# Patient Record
Sex: Female | Born: 1965 | Race: Black or African American | Hispanic: No | Marital: Single | Smoking: Never smoker
Health system: Southern US, Community
[De-identification: ages and names within clinical notes are randomized; demographics above are authoritative.]

## PROBLEM LIST (undated history)

## (undated) VITALS — BP 111/74 | HR 121 | Temp 98.0°F | Resp 18 | Ht 62.0 in | Wt 140.0 lb

## (undated) DIAGNOSIS — Z789 Other specified health status: Secondary | ICD-10-CM

## (undated) DIAGNOSIS — F319 Bipolar disorder, unspecified: Secondary | ICD-10-CM

## (undated) DIAGNOSIS — F99 Mental disorder, not otherwise specified: Secondary | ICD-10-CM

## (undated) HISTORY — PX: NO PAST SURGERIES: SHX2092

---

## 2004-10-12 ENCOUNTER — Ambulatory Visit: Payer: Self-pay | Admitting: Internal Medicine

## 2004-10-23 ENCOUNTER — Encounter: Admission: RE | Admit: 2004-10-23 | Discharge: 2004-11-02 | Payer: Self-pay | Admitting: Internal Medicine

## 2010-04-04 ENCOUNTER — Ambulatory Visit: Payer: Self-pay | Admitting: Diagnostic Radiology

## 2010-04-04 ENCOUNTER — Ambulatory Visit (HOSPITAL_BASED_OUTPATIENT_CLINIC_OR_DEPARTMENT_OTHER): Admission: RE | Admit: 2010-04-04 | Discharge: 2010-04-04 | Payer: Self-pay | Admitting: Emergency Medicine

## 2011-11-01 ENCOUNTER — Ambulatory Visit (INDEPENDENT_AMBULATORY_CARE_PROVIDER_SITE_OTHER): Payer: 59 | Admitting: Internal Medicine

## 2011-11-01 VITALS — BP 112/76 | HR 98 | Temp 98.3°F | Resp 18 | Ht 61.5 in | Wt 145.4 lb

## 2011-11-01 DIAGNOSIS — J042 Acute laryngotracheitis: Secondary | ICD-10-CM

## 2011-11-01 MED ORDER — BENZONATATE 200 MG PO CAPS: 200.0000 mg | ORAL_CAPSULE | Freq: Three times a day (TID) | ORAL | Status: AC | PRN

## 2011-11-01 MED ORDER — AZITHROMYCIN 250 MG PO TABS: ORAL_TABLET | ORAL | Status: AC

## 2011-11-01 NOTE — Progress Notes (Signed)
  Subjective:    Patient ID: Yvonne [name], female    DOB: [birth year], 46 y.o.   MRN: [id number]  Sore Throat  This is a new problem. The current episode started in the past 7 days. The problem has been gradually improving. There has been no fever. Associated symptoms include congestion, coughing and a hoarse voice. Pertinent negatives include no trouble swallowing or vomiting.  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  She has less sore throat now but has not had much of a voice for 2 days.  She has not had a fever, vomiting.  She denies pregnancy.  She has tried Robitussin without relief.  She does not smoke.    Review of Systems  HENT: Positive for congestion and hoarse voice. Negative for trouble swallowing.   Respiratory: Positive for cough.   Gastrointestinal: Negative for vomiting.  All other systems reviewed and are negative.       Objective:   Physical Exam  Vitals reviewed. Constitutional: She is oriented to person, place, and time. She appears well-developed and well-nourished. No distress.  HENT:  Head: Normocephalic.  Right Ear: External ear normal.  Left Ear: External ear normal.  Mouth/Throat: No oropharyngeal exudate.       Voice very weak  Eyes: Conjunctivae are normal.  Cardiovascular: Normal rate, regular rhythm and normal heart sounds.   Pulmonary/Chest: Effort normal and breath sounds normal. No respiratory distress. She has no wheezes. She has no rales. She exhibits no tenderness.  Abdominal: Soft.  Lymphadenopathy:    She has no cervical adenopathy.  Neurological: She is alert and oriented to person, place, and time.  Skin: Skin is warm and dry.  Psychiatric: She has a normal mood and affect. Her behavior is normal.          Assessment & Plan:  Laryngotracheitis:  Zpack & Tessalon Perles.  OOW for 2 days, return Wednesday.  RTC if symptoms do not improve

## 2011-11-01 NOTE — Patient Instructions (Signed)
Take your medication as prescribed and get extra rest and fluids.  Laryngitis At the top of your windpipe is your voice box. It is the source of your voice. Inside your voice box are 2 bands of muscles called vocal cords. When you breathe, your vocal cords are relaxed and open so that air can get into the lungs. When you decide to say something, these cords come together and vibrate. The sound from these vibrations goes into your throat and comes out through your mouth as sound. Laryngitis is an inflammation of the vocal cords that causes hoarseness, cough, loss of voice, sore throat, and dry throat. Laryngitis can be temporary (acute) or long-term (chronic). Most cases of acute laryngitis improve with time.Chronic laryngitis lasts for more than 3 weeks. CAUSES Laryngitis can often be related to excessive smoking, talking, or yelling, as well as inhalation of toxic fumes and allergies. Acute laryngitis is usually caused by a viral infection, vocal strain, measles or mumps, or bacterial infections. Chronic laryngitis is usually caused by vocal cord strain, vocal cord injury, postnasal drip, growths on the vocal cords, or acid reflux. SYMPTOMS   Cough.   Sore throat.   Dry throat.  RISK FACTORS  Respiratory infections.   Exposure to irritating substances, such as cigarette smoke, excessive amounts of alcohol, stomach acids, and workplace chemicals.   Voice trauma, such as vocal cord injury from shouting or speaking too loud.  DIAGNOSIS  Your cargiver will perform a physical exam. During the physical exam, your caregiver will examine your throat. The most common sign of laryngitis is hoarseness. Laryngoscopy may be necessary to confirm the diagnosis of this condition. This procedure allows your caregiver to look into the larynx. HOME CARE INSTRUCTIONS  Drink enough fluids to keep your urine clear or pale yellow.   Rest until you no longer have symptoms or as directed by your caregiver.    Breathe in moist air.   Take all medicine as directed by your caregiver.   Do not smoke.   Talk as little as possible (this includes whispering).   Write on paper instead of talking until your voice is back to normal.   Follow up with your caregiver if your condition has not improved after 10 days.  SEEK MEDICAL CARE IF:   You have trouble breathing.   You cough up blood.   You have persistent fever.   You have increasing pain.   You have difficulty swallowing.  MAKE SURE YOU:  Understand these instructions.   Will watch your condition.   Will get help right away if you are not doing well or get worse.  Document Released: 06/28/2005 Document Revised: 06/17/2011 Document Reviewed: 09/03/2010 [facility] Patient Information 2012 [place], [place].

## 2012-06-20 ENCOUNTER — Encounter (HOSPITAL_COMMUNITY): Payer: Self-pay | Admitting: Licensed Clinical Social Worker

## 2012-06-20 ENCOUNTER — Inpatient Hospital Stay (HOSPITAL_COMMUNITY)
Admission: RE | Admit: 2012-06-20 | Discharge: 2012-07-03 | DRG: 881 | Disposition: A | Discharge status: Home / Self Care | Payer: 59 | Attending: Psychiatry | Admitting: Psychiatry

## 2012-06-20 DIAGNOSIS — F329 Major depressive disorder, single episode, unspecified: Secondary | ICD-10-CM

## 2012-06-20 DIAGNOSIS — F3289 Other specified depressive episodes: Principal | ICD-10-CM | POA: Diagnosis present

## 2012-06-20 DIAGNOSIS — F411 Generalized anxiety disorder: Secondary | ICD-10-CM | POA: Diagnosis present

## 2012-06-20 HISTORY — DX: Other specified health status: Z78.9

## 2012-06-20 MED ORDER — TRAZODONE HCL 50 MG PO TABS
50.0000 mg | ORAL_TABLET | Freq: Every evening | ORAL | Status: DC | PRN
  Filled 2012-06-20 (×31): qty 1

## 2012-06-20 MED ORDER — MAGNESIUM HYDROXIDE 400 MG/5ML PO SUSP: 30.0000 mL | Freq: Every day | ORAL | Status: DC | PRN

## 2012-06-20 MED ORDER — ACETAMINOPHEN 325 MG PO TABS: 650.0000 mg | ORAL_TABLET | Freq: Four times a day (QID) | ORAL | Status: DC | PRN

## 2012-06-20 MED ORDER — ALUM & MAG HYDROXIDE-SIMETH 200-200-20 MG/5ML PO SUSP: 30.0000 mL | ORAL | Status: DC | PRN

## 2012-06-20 MED ORDER — NICOTINE 21 MG/24HR TD PT24
21.0000 mg | MEDICATED_PATCH | Freq: Every day | TRANSDERMAL | Status: DC
  Filled 2012-06-20 (×3): qty 1

## 2012-06-20 NOTE — BH Assessment (Signed)
Assessment Note   Yvonne [name] is an 46 y.o. female, single, African-American who presented to [facility] for assessment accompanied by her sister, Yvonne [name], who participated in the assessment at the Pt's request. Pt was referred for assessment for inpatient treatment by a therapist at the [facility], where the Pt went for her first outpatient appointment, due to the Pt being too acute for outpatient treatment, per Pt's sister. Pt reports "I can't function." She reports she cannot sleep and has been sleeping 2 hour per night for the past week. She reports severe depressive symptoms since Thanksgiving including uncontrollable crying, insomnia, poor appetite with unknown weight loss, social withdrawal, poor concentration, poor memory, decreased grooming, staying in bed and feelings of helplessness and hopelessness. She has been missing work an unable to function on her job at [employer]. She was unable to go to work today. She denies suicidal ideation or history of suicide attempts. She denies homicidal ideation or a history of violence. Pt denies auditory or visual hallucinations. She denies alcohol or substance abuse. She denies legal problems. Pt denies medical problems and is not currently on any medications.  During assessment Pt appeared confused at times and not fully oriented. She had difficulty focusing and processing what was being told to her. I explained 3 times that she was at a hospital before she appeared to fully understand. She was tearful throughout assessment, sobbing at times. She appeared disheveled with poor eye contact and frequently covered her face with her hands.   Pt reports financial problems as her primary stressor. She says she has bills she cannot pay and is worried about maintaining her residence. She lives in a townhouse with her 81 year old son, whom she describes as "okay". During the assessment she perseverated on her inability to function or work.  Pt's sister  reports the Pt and family members just returned from a family cruise and on the cruise the Pt was withdrawn, not sleeping or eating very much. Pt has no history of inpatient or outpatient mental health treatment and has never taken psychiatric medication. He sister reports she has never seen the Pt this way. She reports Pt's mood and behavior are very atypical "This is not [place]." Some of Pt's siblings have been treated for depression and one of her brothers has been psychiatrically hospitalized.   Consulted with Donell Sievert, PA who agreed that due to Pt's severe depressive symptoms, her decreased functioning in terms of caring for herself and inability to work and the fact that Pt has no outpatient providers and cannot be seen by an outpatient psychiatrist in a timely manner that Pt could benefit from inpatient psychiatric treatment.  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 No pertinent past medical history    Axis IV: economic problems Axis V: GAF = 40  Past Medical History:  Past Medical History  Diagnosis Date  . No pertinent past medical history     Past Surgical History  Procedure Date  . No past surgeries     Family History: No family history on file.  Social History:  reports that she has never smoked. She does not have any smokeless tobacco history on file. She reports that she does not drink alcohol or use illicit drugs.  Additional Social History:  Alcohol / Drug Use Pain Medications: Denies Prescriptions: Denies Over the Counter: Denies History of alcohol / drug use?: No history  of alcohol / drug abuse Longest period of sobriety (when/how long): None  CIWA:   COWS:    Allergies: No Known Allergies  Home Medications:  No prescriptions prior to admission    OB/GYN Status:  No LMP recorded. Patient is postmenopausal.  General Assessment Data Location of Assessment: [facility] Assessment  Services Living Arrangements: Children (Lives with 48 year old son) Can pt return to current living arrangement?: Yes Admission Status: Voluntary Is patient capable of signing voluntary admission?: Yes Transfer from: Home Referral Source: Other ([facility])  Education Status Is patient currently in school?: No Current Grade: NA Highest grade of school patient has completed: NA Name of school: NA Contact person: NA  Risk to self Suicidal Ideation: No Suicidal Intent: No Is patient at risk for suicide?: No Suicidal Plan?: No Access to Means: No What has been your use of drugs/alcohol within the last 12 months?: Pt denies Previous Attempts/Gestures: No How many times?: 0  Other Self Harm Risks: None Triggers for Past Attempts: None known Intentional Self Injurious Behavior: None Family Suicide History: No;See progress notes Recent stressful life event(s): Financial Problems Persecutory voices/beliefs?: No Depression: Yes Depression Symptoms: Despondent;Insomnia;Tearfulness;Isolating;Fatigue;Guilt;Loss of interest in usual pleasures;Feeling worthless/self pity;Feeling angry/irritable Substance abuse history and/or treatment for substance abuse?: No Suicide prevention information given to non-admitted patients: Not applicable  Risk to Others Homicidal Ideation: No Thoughts of Harm to Others: No Current Homicidal Intent: No Current Homicidal Plan: No Access to Homicidal Means: No Identified Victim: None History of harm to others?: No Assessment of Violence: None Noted Violent Behavior Description: Pt denies history of violence Does patient have access to weapons?: No Criminal Charges Pending?: No Does patient have a court date: No  Psychosis Hallucinations: None noted Delusions: None noted  Mental Status Report Appear/Hygiene: Disheveled Eye Contact: Fair Motor Activity: Other (Comment) (Slumped over) Speech: Soft;Slow Level of Consciousness: Crying Mood:  Depressed;Helpless;Sad;Despair Affect: Sad;Depressed Anxiety Level: None Thought Processes: Coherent Judgement: Unimpaired Orientation: Person;Situation Obsessive Compulsive Thoughts/Behaviors: None  Cognitive Functioning Concentration: Decreased Memory: Recent Impaired;Remote Intact IQ: Average Insight: Poor Impulse Control: Poor Appetite: Poor Weight Loss: 0  (Unknown) Weight Gain: 0  Sleep: Decreased Total Hours of Sleep: 2  Vegetative Symptoms: Decreased grooming;Staying in bed  ADLScreening [facility] Assessment Services) Patient's cognitive ability adequate to safely complete daily activities?: Yes Patient able to express need for assistance with ADLs?: Yes Independently performs ADLs?: Yes (appropriate for developmental age)  Abuse/Neglect [facility]) Physical Abuse: Denies Verbal Abuse: Denies Sexual Abuse: Denies  Prior Inpatient Therapy Prior Inpatient Therapy: No Prior Therapy Dates: NA Prior Therapy Facilty/Provider(s): NA Reason for Treatment: NA  Prior Outpatient Therapy Prior Outpatient Therapy: No Prior Therapy Dates: NA Prior Therapy Facilty/Provider(s): NA Reason for Treatment: NA  ADL Screening (condition at time of admission) Patient's cognitive ability adequate to safely complete daily activities?: Yes Patient able to express need for assistance with ADLs?: Yes Independently performs ADLs?: Yes (appropriate for developmental age) Weakness of Legs: None Weakness of Arms/Hands: None  Home Assistive Devices/Equipment Home Assistive Devices/Equipment: None    Abuse/Neglect Assessment (Assessment to be complete while patient is alone) Physical Abuse: Denies Verbal Abuse: Denies Sexual Abuse: Denies Exploitation of patient/patient's resources: Denies Self-Neglect: Denies     Merchant navy officer (For Healthcare) Advance Directive: Patient does not have advance directive;Patient would not like information Pre-existing out of facility DNR order (yellow  form or pink MOST form): No Nutrition Screen- [facility] Adult/WL/AP Patient's home diet: Regular Have you recently lost weight without trying?: No Have you  been eating poorly because of a decreased appetite?: No Malnutrition Screening Tool Score: 0   Additional Information 1:1 In Past 12 Months?: No CIRT Risk: No Elopement Risk: No Does patient have medical clearance?: No     Disposition:  Disposition Disposition of Patient: Inpatient treatment program Type of inpatient treatment program: Adult  On Site Evaluation by:   Reviewed with Physician:  Donell Sievert, PA   Patsy Baltimore, Harlin Rain 06/20/2012 8:47 PM

## 2012-06-20 NOTE — H&P (Signed)
Psychiatric Admission Assessment Adult  Patient Identification:  Yvonne [name] Date of Evaluation:  06/20/2012 Chief Complaint:  311 Depressive Disorder NOS History of Present Illness::Pt is a AAF who has presented to [facility] as a walk in with acute onset depressive concerns x one week duration. Pt states depression triggers c/o financial concerns, and relationship issues. Pt denies any prior h/o of psychiatric problems and or prior psychiatric DX. Pt does have a significant family hx of Mental Illness i.e. Son with Bipolar D/o, and sisters x two dx with major depression. Pt denies use of tobacco, alcohol and or polysubstance abuse. 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 Pt is also having crying spells with feelings of guilt due to some financial and relationship decisions that she has made. The patient rates her depressive sx a 7/10 and denies SI/SA/HI and or AVH. Pt denies any concerns with PTSD and or prior h/o of sexual, physical or emotional abuse. Pt is right handed and endorses some brief prior military experience. Elements:  Timing:  one week. Associated Signs/Synptoms: Depression Symptoms:  depressed mood, (Hypo) Manic Symptoms:  none Anxiety Symptoms:  Excessive Worry, Psychotic Symptoms:  none PTSD Symptoms: NA  VS: T 97.6, P91, R16 BP 118/82  Psychiatric Specialty Exam: Physical Exam  Constitutional: She is oriented to person, place, and time. She appears well-developed and well-nourished.  HENT:  Head: Normocephalic.  Eyes: Pupils are equal, round, and reactive to light.  Neck: Neck supple. No thyromegaly present.  Cardiovascular: Normal rate and regular rhythm.  Exam reveals no friction rub.   No murmur heard. Respiratory: Effort normal.  GI: Soft.  Musculoskeletal: Normal range of motion.  Lymphadenopathy:    She has no cervical adenopathy.  Neurological: She is alert and oriented  to person, place, and time. Abnormal coordination: flat, sad affect.  Skin: Skin is warm and dry.    Review of Systems  Constitutional: Negative.   Eyes: Negative.   Respiratory: Negative.   Cardiovascular: Negative.   Gastrointestinal: Negative.   Genitourinary: Negative.   Musculoskeletal: Negative.   Skin: Negative.   Neurological: Positive for headaches.  Endo/Heme/Allergies: Negative.   Psychiatric/Behavioral: Positive for depression. The patient is nervous/anxious and has insomnia.     There were no vitals taken for this visit.There is no height or weight on file to calculate BMI.  General Appearance: Disheveled  Eye Contact::  Poor  Speech:  Garbled  Volume:  Decreased  Mood:  Depressed  Affect:  Congruent  Thought Process:  Disorganized  Orientation:  Full (Time, Place, and Person)  Thought Content:  confused,  Suicidal Thoughts:  No  Homicidal Thoughts:  No  Memory:  Immediate;   Fair  Judgement:  Fair  Insight:  Shallow  Psychomotor Activity:  Negative and Normal  Concentration:  Poor  Recall:  Fair  Akathisia:  NA  Handed:  Right  AIMS (if indicated):     Assets:  Desire for Improvement  Sleep:       Past Psychiatric History: Diagnosis:  Hospitalizations:  Outpatient Care:  Substance Abuse Care:  Self-Mutilation:  Suicidal Attempts:  Violent Behaviors:   Past Medical History:   Past Medical History  Diagnosis Date  . No pertinent past medical history    None. Allergies:  No Known Allergies PTA Medications: No prescriptions prior to admission    Previous Psychotropic Medications:  Medication/Dose  Substance Abuse History in the last 12 months:  no  Consequences of Substance Abuse: NA  Social History:  reports that she has never smoked. She does not have any smokeless tobacco history on file. She reports that she does not drink alcohol or use illicit drugs. Additional Social History: Pain Medications:  Denies Prescriptions: Denies Over the Counter: Denies History of alcohol / drug use?: No history of alcohol / drug abuse Longest period of sobriety (when/how long): None                    Current Place of Residence:   Place of Birth:   Family Members: Marital Status:  Single Children:  Sons:  Daughters: Relationships: Education:  [employer] Problems/Performance: Religious Beliefs/Practices: History of Abuse (Emotional/Phsycial/Sexual) Occupational Experiences; Hotel manager History:  Data processing manager History: Hobbies/Interests:  Family History:  No family history on file.  No results found for this or any previous visit (from the past 72 hour(s)). Psychological Evaluations:  Assessment:   AXIS I:  Mood Disorder NOS AXIS II:  No diagnosis AXIS III:   Past Medical History  Diagnosis Date  . No pertinent past medical history    AXIS IV:  economic problems AXIS V:  31-40 impairment in reality testing  Treatment Plan/Recommendations:  Admit for crises mgmt, safety and stability Psychotropic medical mgmt to reduce current sx to baseline and reduce chance of relapse Mgmt of Chronic co morbidities as needed Social work to facilitate support services and aid in out patient stabilization and prevent frequent inpatient readmissions.   Treatment Plan Summary: Daily contact with patient to assess and evaluate symptoms and progress in treatment Medication management Current Medications:  No current facility-administered medications for this encounter.    Observation Level/Precautions:  15 minute checks  Laboratory:  CBC,BMP,TSH,UA,Hgba1c,LFT  Psychotherapy:    Medications:    Consultations:    Discharge Concerns:    Estimated LOS:  Other:     I certify that inpatient services furnished can reasonably be expected to improve the patient's condition.   Macel Yearsley E 12/10/20138:46 PM

## 2012-06-20 NOTE — Tx Team (Signed)
Initial Interdisciplinary Treatment Plan  PATIENT STRENGTHS: (choose at least two) Ability for insight Average or above average intelligence Capable of independent living Communication skills Motivation for treatment/growth Physical Health Religious Affiliation Supportive family/friends  PATIENT STRESSORS: Financial difficulties Legal issue   PROBLEM LIST: Problem List/Patient Goals Date to be addressed Date deferred Reason deferred Estimated date of resolution  " i just wanna be able to function" 06/20/12           Depression 06/20/12     Increased risk for suicide 06/20/12                                    DISCHARGE CRITERIA:  Ability to meet basic life and health needs Adequate post-discharge living arrangements Improved stabilization in mood, thinking, and/or behavior Motivation to continue treatment in a less acute level of care Need for constant or close observation no longer present Safe-care adequate arrangements made Verbal commitment to aftercare and medication compliance  PRELIMINARY DISCHARGE PLAN: Outpatient therapy Participate in family therapy Return to previous living arrangement Return to previous work or school arrangements  PATIENT/FAMIILY INVOLVEMENT: This treatment plan has been presented to and reviewed with the patient, Yvonne [name], and/or family member.  The patient and family have been given the opportunity to ask questions and make suggestions.  [name] 06/20/2012, 9:52 PM

## 2012-06-20 NOTE — Progress Notes (Signed)
Report received from K. Herbin RN. Writer entered patients room and observed patient lying in bed with covers pulled over her head asleep. Respirations even and no distress noted, safety maintained on unit with 15 min checks, will continue to monitor.

## 2012-06-21 DIAGNOSIS — F341 Dysthymic disorder: Secondary | ICD-10-CM

## 2012-06-21 LAB — URINE MICROSCOPIC-ADD ON

## 2012-06-21 LAB — URINALYSIS, ROUTINE W REFLEX MICROSCOPIC
Ketones, ur: NEGATIVE mg/dL
Nitrite: NEGATIVE
Protein, ur: NEGATIVE mg/dL
pH: 6.5 (ref 5.0–8.0)

## 2012-06-21 LAB — HEPATIC FUNCTION PANEL
Albumin: 3.8 g/dL (ref 3.5–5.2)
Bilirubin, Direct: <0.1 mg/dL (ref 0.0–0.3)
Total Bilirubin: 0.3 mg/dL (ref 0.3–1.2)

## 2012-06-21 LAB — COMPREHENSIVE METABOLIC PANEL
Albumin: 3.8 g/dL (ref 3.5–5.2)
Alkaline Phosphatase: 83 U/L (ref 39–117)
BUN: 11 mg/dL (ref 6–23)
CO2: 24 mEq/L (ref 19–32)
Chloride: 101 mEq/L (ref 96–112)
Creatinine, Ser: 0.68 mg/dL (ref 0.50–1.10)
GFR calc non Af Amer: >90 mL/min (ref >90)
Glucose, Bld: 92 mg/dL (ref 70–99)
Potassium: 3.6 mEq/L (ref 3.5–5.1)
Total Bilirubin: 0.3 mg/dL (ref 0.3–1.2)

## 2012-06-21 LAB — RAPID URINE DRUG SCREEN, HOSP PERFORMED: Barbiturates: NOT DETECTED

## 2012-06-21 LAB — CBC
MCH: 28.2 pg (ref 26.0–34.0)
MCHC: 34.5 g/dL (ref 30.0–36.0)
Platelets: 432 10*3/uL — ABNORMAL HIGH (ref 150–400)

## 2012-06-21 LAB — LIPID PANEL
Cholesterol: 166 mg/dL (ref 0–200)
LDL Cholesterol: 96 mg/dL (ref 0–99)
Total CHOL/HDL Ratio: 2.9 RATIO
VLDL: 12 mg/dL (ref 0–40)

## 2012-06-21 LAB — TSH: TSH: 0.517 u[IU]/mL (ref 0.350–4.500)

## 2012-06-21 LAB — HEMOGLOBIN A1C: Hgb A1c MFr Bld: 5.5 % (ref <5.7)

## 2012-06-21 NOTE — Progress Notes (Signed)
Patient ID: Yvonne [name], female   DOB: 06-09-1966, 46 y.o.   MRN: [id number]   Pt was accompanied by her mother to [facility]. Pt was forwarded little info to the writer only stating that she was "overwhelmed" with bills and requesting to lay down to get sleep. Once on the unit, pt stated, "I don't know how to do this".  Writer spent more time with the pt and reassured her that staff would assist her and inform of expectations. Pt was concerned about personal hygiene, and encouraged pt to put on gowns for sleep and allow staff to wash her clothes. However, pt lay in bed and covered up wearing her street clothes. Pt denied SI

## 2012-06-21 NOTE — Progress Notes (Signed)
[facility] LCSW Aftercare Discharge Planning Group Note  06/21/2012 2:19 PM  Participation Quality:  Inattentive  Affect:  Defensive  Cognitive:  Confused  Insight:  Lacking  Engagement in Group:  Poor  Modes of Intervention:  Problem-solving and Support  Summary of Progress/Problems:  Patient reported she was unable to focus and did not know why she had been admitted to the hospital.                                                                                                   Wynn Banker 06/21/2012, 2:19 PM

## 2012-06-21 NOTE — [facility] Suicide Risk Assessment (Signed)
Suicide Risk Assessment  Admission Assessment     Nursing information obtained from:  [name] Current Mental Status:  NA Loss Factors:  Financial problems / change in socioeconomic status Historical Factors:  Family history of mental illness or substance abuse (sister and brothers mental illness) Risk Reduction Factors:  Sense of responsibility to family;Religious beliefs about death;Employed;Living with another person, especially a relative;Positive therapeutic relationship  CLINICAL FACTORS:   Depression:   Anhedonia Hopelessness Insomnia Severe  COGNITIVE FEATURES THAT CONTRIBUTE TO RISK:  Closed-mindedness    SUICIDE RISK:   Mild:  Suicidal ideation of limited frequency, intensity, duration, and specificity.  There are no identifiable plans, no associated intent, mild dysphoria and related symptoms, good self-control (both objective and subjective assessment), few other risk factors, and identifiable protective factors, including available and accessible social support.  PLAN OF CARE: Educate patient about depression and anxiety. Encourage her to attend groups and participate.   Yvonne [name] 06/21/2012, 10:55 AM

## 2012-06-21 NOTE — Progress Notes (Signed)
D: Patient denies SI/HI and A/V hallucinations ; patient reports sleep to be poor ; reports energy level is low; reports ability to pay attention to low; rates depression as 7/10; rates hopelessness 5/10; rates anxiety as -/10; patient just states she has not been able to focus and is overwhelmed by her financial situation  A: Monitored q 15 minutes; patient encouraged to attend groups; patient educated about medications; patient given medications per physician orders; patient encouraged to express feelings and/or concerns  R: Patient  Is isolative and stays in her room and wants to rest; patient's interaction with staff and peers is isolative;  patient is taking medications as prescribed and tolerating medications; patient is not attending all groups

## 2012-06-21 NOTE — Progress Notes (Signed)
[facility] Group Notes:  (Counselor/Nursing/MHT/Case Management/Adjunct)  06/21/2012 4:27 PM  Type of Therapy:  Psychoeducational Skills  Participation Level:  Did Not Attend  Summary of Progress/Problems: [place] did not attend psychoeducational group on labels.   Wandra Scot 06/21/2012, 4:27 PM

## 2012-06-21 NOTE — H&P (Signed)
Patient seen and assessed. Patient presenting very distraught over her financial situation and having made poor choices. Not interested in medication for depression or anxiety. Will continue to monitor.

## 2012-06-21 NOTE — Progress Notes (Signed)
D: Patient in bed and states she does not want to get out of bed because she is tired.  Patient denies depression and anxiety.  Patient denies SI/HI and denies AVH. Patient states she is just resting while she is here. A: Staff to monitor Q 15 mins for safety.  Encouragement and support offered.  Patient refused all medications. R: Patient remains safe on the unit.  Patient calm but will not participate.  Patient refuses all medications

## 2012-06-22 DIAGNOSIS — F329 Major depressive disorder, single episode, unspecified: Secondary | ICD-10-CM | POA: Diagnosis present

## 2012-06-22 LAB — URINE CULTURE

## 2012-06-22 MED ORDER — SERTRALINE HCL 25 MG PO TABS
25.0000 mg | ORAL_TABLET | Freq: Every day | ORAL | Status: DC
  Administered 2012-06-22 – 2012-06-24 (×3): 25 mg via ORAL
  Filled 2012-06-22 (×6): qty 1

## 2012-06-22 NOTE — Progress Notes (Signed)
Psychoeducational Group Note  Date:  06/22/2012 Time:  2000   Group Topic/Focus:  Karaoke  Participation Level:  Did Not Attend  Participation Quality:    Affect:    Cognitive:    Insight:    Engagement in Group:    Additional Comments:    Yvonne [name] [name] 06/22/2012, 10:19 PM

## 2012-06-22 NOTE — Progress Notes (Signed)
Psychoeducational Group Note  Date:  06/22/2012 Time:  1100  Group Topic/Focus:  Wellness Toolbox:   The focus of this group is to discuss various aspects of wellness, balancing those aspects and exploring ways to increase the ability to experience wellness.  Patients will create a wellness toolbox for use upon discharge.  Participation Level:  Did not participate  Participation Quality:  Did not attend  Affect:  Did not attend  Cognitive:    Insight:  Did not attend  Engagement in Group:  Did not attend  Additional Comments:  Did not attend  [name] 06/22/2012, 4:12 PM

## 2012-06-22 NOTE — Progress Notes (Signed)
Resting quietly with eyes closed, respirations even and unlabored. No distress noted. Q 15 minute check continues as ordered to maintain safety.

## 2012-06-22 NOTE — Progress Notes (Signed)
Psychoeducational Group Note  Date:  06/22/2012 Time: 1000  Group Topic/Focus:  Overcoming Stress:   The focus of this group is to define stress and help patients assess their triggers.  Participation Level: Did Not Attend  Participation Quality:  Not Applicable  Affect:  Not Applicable  Cognitive:  Not Applicable  Insight:  Not Applicable  Engagement in Group: Not Applicable  Additional Comments:  Patient did not attend group. Patient remained in bed.  Karleen Hampshire Brittini 06/22/2012, 11:13 AM

## 2012-06-22 NOTE — Progress Notes (Signed)
D:  Patient has been in her room crying much of the day.  She frequently states "My life is over."  Does not elaborate other than she believes she will lose her job.  Patient has not attended groups today, but has been getting up for meals.  Rates depression and hopelessness both at a 5 today.  Denies suicidal thoughts.  A:  Explained medications to patient.  Educated about why she was getting it and possible side effects.  Offered support and encouraged her to talk and open up by asking open ended questions.  15 minute safety checks maintained throughout the day.  R:  Tearful much of the day.  Does not engage in conversation much with staff or peers.  Does not feel that she is depressed and does not understand why she is being treated for depression.

## 2012-06-22 NOTE — Progress Notes (Signed)
[facility] LCSW Group Therapy       Living a Balanced Life  1:15-2:30      06/22/2012 3:10 PM  Type of Therapy:  Group Therapy  Participation Level:  Minimal  Participation Quality:  Attentive  Affect:  Blunted and Depressed  Cognitive: Confused  Insight:  Lacking  Engagement in Therapy:  Lacking  Modes of Intervention:  Education, Exploration, Problem-solving and Support  Summary of Progress/Problems:  Patient listened to speaker but did not participate other than to state her strength is in [place].  [place], Simpson Hairston 06/22/2012, 3:10 PM

## 2012-06-22 NOTE — Progress Notes (Signed)
[facility] LCSW Aftercare Discharge Planning Group Note       8:30-9:30 AM   06/22/2012 3:12 PM  Participation Quality:  Resistant  Affect:  Anxious  Cognitive:  Confused  Insight:  Lacking  Engagement in Group:  Lacking  Modes of Intervention:  Exploration, Problem-solving and Support  Summary of Progress/Problems:  Patient stated, "my life is over, no home, no job."   Patient denied SI/HI depression and anxiety.  She stated she feels awful.  Wynn Banker 06/22/2012, 3:12 PM

## 2012-06-22 NOTE — Clinical Social Work Note (Signed)
Writer spoke with patient's sister who advised other than patient having a lot of bills, she does not know why she admitted to the hospital.  Sister shared she has no concerns that patient would discharge from hospital and harm herself.  She stated there is a history of mental illness in the family.  Sister was encouraged to asked patient to talk with [place] and to sign consent for contact with employer.  Sister stated she has notified employer of hospitalization but needs [place] to complete FMLA.  Sister was informed that patient will need to be able to assist [place] with providing employee specific information.  After speaking with sister, patient asked to meet with writer to contact employer.  Writer suggested patient wait until tomorrow to make contact as she was not stable enough to speak with employer or insurance company for [employer].

## 2012-06-22 NOTE — Progress Notes (Signed)
[facility] MD Progress Note  06/22/2012 12:10 PM Yvonne [name]  MRN:  [id number] Subjective:  Patient continues to be very upset over her financial situation. Crying frequently. Initially declined to be started on medication, later agreed to take medication. Diagnosis:   Axis I: Depressive Disorder NOS Axis II: No diagnosis Axis III:  Past Medical History  Diagnosis Date  . No pertinent past medical history    Axis IV: economic problems and occupational problems Axis V: 41-50 serious symptoms  ADL's:  Intact  Sleep: Fair  Appetite:  Poor  Psychiatric Specialty Exam: Review of Systems  Constitutional: Negative.   HENT: Negative.   Eyes: Negative.   Respiratory: Negative.   Cardiovascular: Negative.   Gastrointestinal: Negative.   Genitourinary: Negative.   Musculoskeletal: Negative.   Skin: Negative.   Neurological: Negative.   Endo/Heme/Allergies: Negative.   Psychiatric/Behavioral: Negative.     Blood pressure 107/77, pulse 105, temperature 98.1 F (36.7 C), temperature source Oral, resp. rate 18, height 5\' 2"  (1.575 m), weight 63.504 kg (140 lb).Body mass index is 25.61 kg/(m^2).  General Appearance: Disheveled  Eye Contact::  Minimal  Speech:  Garbled  Volume:  Decreased  Mood:  Depressed and Hopeless  Affect:  Constricted and Tearful  Thought Process:  Circumstantial  Orientation:  Full (Time, Place, and Person)  Thought Content:  Rumination  Suicidal Thoughts:  No  Homicidal Thoughts:  No  Memory:  Immediate;   Fair Recent;   Fair Remote;   Fair  Judgement:  Impaired  Insight:  Lacking  Psychomotor Activity:  Normal  Concentration:  Fair  Recall:  Fair  Akathisia:  No  Handed:  Right  AIMS (if indicated):     Assets:  Communication Skills Housing Social Support  Sleep:  Number of Hours: 6    Current Medications: Current Facility-Administered Medications  Medication Dose Route Frequency Provider Last Rate Last Dose  . acetaminophen (TYLENOL) tablet  650 mg  650 mg Oral Q6H PRN [name], PA      . alum & mag hydroxide-simeth (MAALOX/MYLANTA) 200-200-20 MG/5ML suspension 30 mL  30 mL Oral Q4H PRN [name], PA      . magnesium hydroxide (MILK OF MAGNESIA) suspension 30 mL  30 mL Oral Daily PRN [name], PA      . traZODone (DESYREL) tablet 50 mg  50 mg Oral QHS,MR X 1 [name], PA      . [DISCONTINUED] nicotine (NICODERM CQ - dosed in mg/24 hours) patch 21 mg  21 mg Transdermal Q0600 [name], PA        Lab Results:  Results for orders placed during the hospital encounter of 06/20/12 (from the past 48 hour(s))  CBC     Status: Abnormal   Collection Time   06/21/12  6:33 AM      Component Value Range Comment   WBC 7.4  4.0 - 10.5 K/uL    RBC 5.00  3.87 - 5.11 MIL/uL    Hemoglobin 14.1  12.0 - 15.0 g/dL    HCT 40.9  81.1 - 91.4 %    MCV 81.8  78.0 - 100.0 fL    MCH 28.2  26.0 - 34.0 pg    MCHC 34.5  30.0 - 36.0 g/dL    RDW 78.2  95.6 - 21.3 %    Platelets 432 (*) 150 - 400 K/uL   COMPREHENSIVE METABOLIC PANEL     Status: Normal   Collection Time   06/21/12  6:33 AM      Component Value Range Comment   Sodium 136  135 - 145 mEq/L    Potassium 3.6  3.5 - 5.1 mEq/L    Chloride 101  96 - 112 mEq/L    CO2 24  19 - 32 mEq/L    Glucose, Bld 92  70 - 99 mg/dL    BUN 11  6 - 23 mg/dL    Creatinine, Ser 1.61  0.50 - 1.10 mg/dL    Calcium 9.7  8.4 - 09.6 mg/dL    Total Protein 8.1  6.0 - 8.3 g/dL    Albumin 3.8  3.5 - 5.2 g/dL    AST 16  0 - 37 U/L    ALT 11  0 - 35 U/L    Alkaline Phosphatase 83  39 - 117 U/L    Total Bilirubin 0.3  0.3 - 1.2 mg/dL    GFR calc non Af Amer >90  >90 mL/min    GFR calc Af Amer >90  >90 mL/min   HEMOGLOBIN A1C     Status: Normal   Collection Time   06/21/12  6:33 AM      Component Value Range Comment   Hemoglobin A1C 5.5  <5.7 %    Mean Plasma Glucose 111  <117 mg/dL   MAGNESIUM     Status: Normal   Collection Time   06/21/12  6:33 AM      Component Value Range  Comment   Magnesium 2.1  1.5 - 2.5 mg/dL   ETHANOL     Status: Normal   Collection Time   06/21/12  6:33 AM      Component Value Range Comment   Alcohol, Ethyl (B) <11  0 - 11 mg/dL   LIPID PANEL     Status: Normal   Collection Time   06/21/12  6:33 AM      Component Value Range Comment   Cholesterol 166  0 - 200 mg/dL    Triglycerides 58  <045 mg/dL    HDL 58  >40 mg/dL    Total CHOL/HDL Ratio 2.9      VLDL 12  0 - 40 mg/dL    LDL Cholesterol 96  0 - 99 mg/dL   HEPATIC FUNCTION PANEL     Status: Normal   Collection Time   06/21/12  6:33 AM      Component Value Range Comment   Total Protein 8.2  6.0 - 8.3 g/dL    Albumin 3.8  3.5 - 5.2 g/dL    AST 16  0 - 37 U/L    ALT 11  0 - 35 U/L    Alkaline Phosphatase 83  39 - 117 U/L    Total Bilirubin 0.3  0.3 - 1.2 mg/dL    Bilirubin, Direct <9.8  0.0 - 0.3 mg/dL    Indirect Bilirubin NOT CALCULATED  0.3 - 0.9 mg/dL   TSH     Status: Normal   Collection Time   06/21/12  6:33 AM      Component Value Range Comment   TSH 0.517  0.350 - 4.500 uIU/mL   URINALYSIS, ROUTINE W REFLEX MICROSCOPIC     Status: Abnormal   Collection Time   06/21/12  7:13 AM      Component Value Range Comment   Color, Urine YELLOW  YELLOW    APPearance CLOUDY (*) CLEAR    Specific Gravity, Urine 1.026  1.005 - 1.030    pH 6.5  5.0 - 8.0    Glucose, UA NEGATIVE  NEGATIVE mg/dL    Hgb urine dipstick SMALL (*) NEGATIVE    Bilirubin Urine NEGATIVE  NEGATIVE    Ketones, ur NEGATIVE  NEGATIVE mg/dL    Protein, ur NEGATIVE  NEGATIVE mg/dL    Urobilinogen, UA 0.2  0.0 - 1.0 mg/dL    Nitrite NEGATIVE  NEGATIVE    Leukocytes, UA MODERATE (*) NEGATIVE   PREGNANCY, URINE     Status: Normal   Collection Time   06/21/12  7:13 AM      Component Value Range Comment   Preg Test, Ur NEGATIVE  NEGATIVE   URINE RAPID DRUG SCREEN (HOSP PERFORMED)     Status: Normal   Collection Time   06/21/12  7:13 AM      Component Value Range Comment   Opiates NONE DETECTED  NONE  DETECTED    Cocaine NONE DETECTED  NONE DETECTED    Benzodiazepines NONE DETECTED  NONE DETECTED    Amphetamines NONE DETECTED  NONE DETECTED    Tetrahydrocannabinol NONE DETECTED  NONE DETECTED    Barbiturates NONE DETECTED  NONE DETECTED   URINE MICROSCOPIC-ADD ON     Status: Abnormal   Collection Time   06/21/12  7:13 AM      Component Value Range Comment   Squamous Epithelial / LPF FEW (*) RARE    WBC, UA 3-6  <3 WBC/hpf    RBC / HPF 0-2  <3 RBC/hpf    Bacteria, UA FEW (*) RARE    Urine-Other MUCOUS PRESENT     URINE CULTURE     Status: Normal   Collection Time   06/21/12  7:13 AM      Component Value Range Comment   Specimen Description URINE, RANDOM      Special Requests NONE      Culture  Setup Time 06/21/2012 10:18      Colony Count 8,000 COLONIES/ML      Culture INSIGNIFICANT GROWTH      Report Status 06/22/2012 FINAL       Physical Findings: AIMS: Facial and Oral Movements Muscles of Facial Expression: None, normal Lips and Perioral Area: None, normal Jaw: None, normal Tongue: None, normal,Extremity Movements Upper (arms, wrists, hands, fingers): None, normal Lower (legs, knees, ankles, toes): None, normal, Trunk Movements Neck, shoulders, hips: None, normal, Overall Severity Severity of abnormal movements (highest score from questions above): None, normal Incapacitation due to abnormal movements: None, normal Patient's awareness of abnormal movements (rate only patient's report): No Awareness, Dental Status Current problems with teeth and/or dentures?: No Does patient usually wear dentures?: No  CIWA:    COWS:     Treatment Plan Summary: Daily contact with patient to assess and evaluate symptoms and progress in treatment Medication management  Plan: Start Zoloft at 25mg  po qd. Encourage patient to attend groups.  Medical Decision Making Problem Points:  Established problem, worsening (2), Review of last therapy session (1) and Review of psycho-social  stressors (1) Data Points:  Review of medication regiment & side effects (2)  I certify that inpatient services furnished can reasonably be expected to improve the patient's condition.   Myah Guynes 06/22/2012, 12:10 PM

## 2012-06-23 NOTE — Progress Notes (Signed)
D   Pt isolated to her room and refused to attend groups   She does not interact with others  She appears tearful and irritable   She refused medications A   Verbal support given  Medications offered and education done on sleep medication  Q 15 min checks R   Pt safe at present

## 2012-06-23 NOTE — Progress Notes (Signed)
D: Patient's mood/affect is depressed. She reported on self inventory sheet that her appetite is improving, energy level is normal and ability to pay attention is improving. Patient rated depression and feelings of hopelessness "3".  A: Support and encouragement provided to patient. Scheduled medications administered per MD orders. Monitor Q15 minute checks for safety.  R: Patient receptive. Denies SI/HI/AVH. Patient remains safe.

## 2012-06-23 NOTE — Progress Notes (Signed)
Psychoeducational Group Note  Date:  06/23/2012 Time:  1100  Group Topic/Focus:  Relapse Prevention Planning:   The focus of this group is to define relapse and discuss the need for planning to combat relapse.  Participation Level:  Active  Participation Quality:  Appropriate  Affect:  Appropriate  Cognitive:  Appropriate  Insight:  Engaged  Engagement in Group:  Engaged  Additional Comments:  Pt participated in the relapse prevention. Did not participate in LL group.  Nikodem Leadbetter A 06/23/2012, 12:01 PM

## 2012-06-23 NOTE — Progress Notes (Signed)
[facility] LCSW Aftercare Discharge Planning Group Note       8:30-9:30 AM   06/23/2012 10:26 AM  Participation Quality:  Appropriate  Affect:  Appropriate  Cognitive:  Appropriate  Insight:  Engaged  Engagement in Group:  Engaged  Modes of Intervention:  Education, Geophysicist/field seismologist of Progress/Problems:  Patient reports being better today and denies SI/HI.  She rates all symptoms at zero and hopes to discharge home today.  Patient reports having home, transportation, and access to meds.  Wynn Banker 06/23/2012, 10:26 AM

## 2012-06-23 NOTE — Progress Notes (Signed)
[facility] LCSW Group Therapy        Feelings Around Relapse        1:15-2:30 PM    06/23/2012 2:34 PM  Type of Therapy:  Group Therapy  Participation Level:  Minimal  Participation Quality:  Inattentive  Affect:  Depressed  Cognitive:  Appropriate  Insight:  Lacking  Engagement in Therapy:  Limited  Modes of Intervention:  Education, Exploration, Problem-solving and Support  Summary of Progress/Problems:  Patient was very distracted and stated relapse for her meant having come into this hospital and not being able to discharge.  When asked to identify a bad  Habit she could return to she stated it would be a relationship.  Wynn Banker 06/23/2012, 2:34 PM

## 2012-06-23 NOTE — Progress Notes (Signed)
[facility] Group Notes:  (Counselor/Nursing/MHT/Case Management/Adjunct)  06/23/2012 11:52 PM  Type of Therapy:  Group Therapy  Participation Level:  Did Not Attend  Participation Quality:  did not attend  Affect:  did not attend  Cognitive:  did not attend  Insight:  Did not attend  Engagement in Group:  Did not attend  Engagement in Therapy:  Did not attend  Modes of Intervention:  Did not attend  Summary of Progress/Problems: Pt. Was sleeping in bed.   Sondra Come 06/23/2012, 11:52 PM
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
## 2012-06-23 NOTE — Progress Notes (Signed)
[facility] MD Progress Note  06/23/2012 12:25 PM Yvonne [name]  MRN:  [id number] Subjective:  Patient perseverating on her financial stressors and today on her job. Worried if she still has a job. She is tolerating the Zoloft wellw ithout side effects. Difficult to engage in conversation, gets agitated.  Diagnosis:   Axis I: Depressive Disorder NOS Axis II: No diagnosis Axis III:  Past Medical History  Diagnosis Date  . No pertinent past medical history    Axis IV: economic problems, housing problems and occupational problems Axis V: 51-60 moderate symptoms  ADL's:  Intact  Sleep: Fair  Appetite:  Fair  Psychiatric Specialty Exam: Review of Systems  Constitutional: Negative.   HENT: Negative.   Eyes: Negative.   Respiratory: Negative.   Cardiovascular: Negative.   Gastrointestinal: Negative.   Genitourinary: Negative.   Musculoskeletal: Negative.   Skin: Negative.   Neurological: Negative.   Endo/Heme/Allergies: Negative.   Psychiatric/Behavioral: Positive for depression. The patient is nervous/anxious.     Blood pressure 107/78, pulse 109, temperature 98.3 F (36.8 C), temperature source Oral, resp. rate 16, height 5\' 2"  (1.575 m), weight 63.504 kg (140 lb).Body mass index is 25.61 kg/(m^2).  General Appearance: Casual  Eye Contact::  Minimal  Speech:  Pressured  Volume:  Normal  Mood:  Anxious, Depressed and Dysphoric  Affect:  Constricted and Flat  Thought Process:  Disorganized  Orientation:  Full (Time, Place, and Person)  Thought Content:  Rumination  Suicidal Thoughts:  No  Homicidal Thoughts:  No  Memory:  Immediate;   Fair Recent;   Fair Remote;   Fair  Judgement:  Impaired  Insight:  Lacking  Psychomotor Activity:  Normal  Concentration:  Fair  Recall:  Fair  Akathisia:  No  Handed:  Right  AIMS (if indicated):     Assets:  Social Support  Sleep:  Number of Hours: 3.5    Current Medications: Current Facility-Administered Medications  Medication  Dose Route Frequency Provider Last Rate Last Dose  . acetaminophen (TYLENOL) tablet 650 mg  650 mg Oral Q6H PRN [name], PA      . alum & mag hydroxide-simeth (MAALOX/MYLANTA) 200-200-20 MG/5ML suspension 30 mL  30 mL Oral Q4H PRN [name], PA      . magnesium hydroxide (MILK OF MAGNESIA) suspension 30 mL  30 mL Oral Daily PRN [name], PA      . sertraline (ZOLOFT) tablet 25 mg  25 mg Oral Daily Siddhartha Hoback, MD   25 mg at 06/23/12 0759  . traZODone (DESYREL) tablet 50 mg  50 mg Oral QHS,MR X 1 [name], PA        Lab Results: No results found for this or any previous visit (from the past 48 hour(s)).  Physical Findings: AIMS: Facial and Oral Movements Muscles of Facial Expression: None, normal Lips and Perioral Area: None, normal Jaw: None, normal Tongue: None, normal,Extremity Movements Upper (arms, wrists, hands, fingers): None, normal Lower (legs, knees, ankles, toes): None, normal, Trunk Movements Neck, shoulders, hips: None, normal, Overall Severity Severity of abnormal movements (highest score from questions above): None, normal Incapacitation due to abnormal movements: None, normal Patient's awareness of abnormal movements (rate only patient's report): No Awareness, Dental Status Current problems with teeth and/or dentures?: No Does patient usually wear dentures?: No  CIWA:    COWS:     Treatment Plan Summary: Daily contact with patient to assess and evaluate symptoms and progress in treatment Medication management  Plan:  Continue current plan of care.  Medical Decision Making Problem Points:  Established problem, stable/improving (1), Review of last therapy session (1) and Review of psycho-social stressors (1) Data Points:  Review of medication regiment & side effects (2)  I certify that inpatient services furnished can reasonably be expected to improve the patient's condition.   Graham Doukas 06/23/2012, 12:25 PM

## 2012-06-24 NOTE — Progress Notes (Signed)
Goals Group    DNA

## 2012-06-24 NOTE — Clinical Social Work Note (Signed)
[facility] Group Notes: (Clinical Social Work)   06/24/2012   3-4pm   Type of Therapy:  Group Therapy   Participation Level:  Did Not Attend    Ambrose Mantle, LCSW 06/24/2012, 4:57 PM

## 2012-06-24 NOTE — Progress Notes (Signed)
Patient ID: Yvonne [name], female   DOB: 12/20/1965, 46 y.o.   MRN: [id number] [facility] MD Progress Note  06/24/2012 3:05 PM Yvonne [name]  MRN:  [id number]  Subjective: "I'm here and I'm living that's about it. I regret everything that I had put my family through. My life is ruined. I don't have a job or nothing. I feel like I'm stuck in a place".  Diagnosis:   Axis I: Depressive Disorder NOS Axis II: No diagnosis Axis III:  Past Medical History  Diagnosis Date  . No pertinent past medical history    Axis IV: economic problems, housing problems and occupational problems Axis V: 51-60 moderate symptoms  ADL's:  Intact  Sleep: Fair  Appetite:  Fair  Psychiatric Specialty Exam: Review of Systems  Constitutional: Negative.   HENT: Negative.   Eyes: Negative.   Respiratory: Negative.   Cardiovascular: Negative.   Gastrointestinal: Negative.   Genitourinary: Negative.   Musculoskeletal: Negative.   Skin: Negative.   Neurological: Negative.   Endo/Heme/Allergies: Negative.   Psychiatric/Behavioral: Positive for depression (currently being stabilized with medication.). The patient is nervous/anxious.     Blood pressure 107/78, pulse 109, temperature 98.3 F (36.8 C), temperature source Oral, resp. rate 16, height 5\' 2"  (1.575 m), weight 63.504 kg (140 lb).Body mass index is 25.61 kg/(m^2).  General Appearance: Casual  Eye Contact::  Minimal  Speech:  Normal  Volume:  Normal  Mood:  Anxious, Depressed and Dysphoric  Affect:  Constricted and Flat  Thought Process:  Disorganized  Orientation:  Full (Time, Place, and Person)  Thought Content:  Rumination  Suicidal Thoughts:  No  Homicidal Thoughts:  No  Memory:  Immediate;   Fair Recent;   Fair Remote;   Fair  Judgement:  Impaired  Insight:  Lacking  Psychomotor Activity:  Normal  Concentration:  Fair  Recall:  Fair  Akathisia:  No  Handed:  Right  AIMS (if indicated):     Assets:  Social Support  Sleep:  Number of  Hours: 6    Current Medications: Current Facility-Administered Medications  Medication Dose Route Frequency Provider Last Rate Last Dose  . acetaminophen (TYLENOL) tablet 650 mg  650 mg Oral Q6H PRN [name], PA      . alum & mag hydroxide-simeth (MAALOX/MYLANTA) 200-200-20 MG/5ML suspension 30 mL  30 mL Oral Q4H PRN [name], PA      . magnesium hydroxide (MILK OF MAGNESIA) suspension 30 mL  30 mL Oral Daily PRN [name], PA      . sertraline (ZOLOFT) tablet 25 mg  25 mg Oral Daily [name], MD   25 mg at 06/24/12 0847  . traZODone (DESYREL) tablet 50 mg  50 mg Oral QHS,MR X 1 [name], PA        Lab Results: No results found for this or any previous visit (from the past 48 hour(s)).  Physical Findings: AIMS: Facial and Oral Movements Muscles of Facial Expression: None, normal Lips and Perioral Area: None, normal Jaw: None, normal Tongue: None, normal,Extremity Movements Upper (arms, wrists, hands, fingers): None, normal Lower (legs, knees, ankles, toes): None, normal, Trunk Movements Neck, shoulders, hips: None, normal, Overall Severity Severity of abnormal movements (highest score from questions above): None, normal Incapacitation due to abnormal movements: None, normal Patient's awareness of abnormal movements (rate only patient's report): No Awareness, Dental Status Current problems with teeth and/or dentures?: No Does patient usually wear dentures?: No  CIWA:    COWS:  Treatment Plan Summary: Daily contact with patient to assess and evaluate symptoms and progress in treatment Medication management  Plan: Increased Sertraline frons 25 mg to 50 mg daily. Continue current treatment plan.  Medical Decision Making Problem Points:  Established problem, stable/improving (1), Review of last therapy session (1) and Review of psycho-social stressors (1) Data Points:  Review of medication regiment & side effects (2)  I certify that inpatient  services furnished can reasonably be expected to improve the patient's condition.   Armandina Stammer I 06/24/2012, 3:05 PM

## 2012-06-24 NOTE — Progress Notes (Signed)
Patient ID: Yvonne [name], female   DOB: August 10, 1965, 46 y.o.   MRN: [id number]  D: Pt denies SI/HI/AVH. Pt is isolative and maintains minimal contact.   A: Pt was offered support and encouragement.  Pt was encourage to attend groups. Q 15 minute checks were done for safety.   R:. Pt receptive to treatment and safety maintained on unit.

## 2012-06-24 NOTE — Progress Notes (Signed)
Psychoeducational Group Note  Date:  06/24/2012 Time:  2000  Group Topic/Focus:  Wrap-Up Group:   The focus of this group is to help patients review their daily goal of treatment and discuss progress on daily workbooks.  Participation Level: Did Not Attend  Participation Quality:  Not Applicable  Affect:  Not Applicable  Cognitive:  Not Applicable  Insight:  Not Applicable  Engagement in Group: Not Applicable  Additional Comments:  The patient did not attend group this evening.   Hazle Coca S 06/24/2012, 10:26 PM

## 2012-06-25 DIAGNOSIS — F329 Major depressive disorder, single episode, unspecified: Principal | ICD-10-CM

## 2012-06-25 MED ORDER — SERTRALINE HCL 50 MG PO TABS
50.0000 mg | ORAL_TABLET | Freq: Every day | ORAL | Status: DC
  Administered 2012-06-25 – 2012-06-30 (×5): 50 mg via ORAL
  Filled 2012-06-25 (×8): qty 1

## 2012-06-25 NOTE — Clinical Social Work Note (Signed)
[facility] Group Notes: (Clinical Social Work)   06/25/2012   3-4pm   Type of Therapy:  Group Therapy   Participation Level:  Did Not Attend    Ambrose Mantle, LCSW 06/25/2012, 4:41 PM

## 2012-06-25 NOTE — Progress Notes (Signed)
D) Pt got up this morning for her morning medications and stated, "I'm going back to bed". Pt encouraged to go to group. States that she feels very down and is having trouble pushing herself to function. Affect is flat, mood depressed. Pt was able to go to some of the groups, but then in the afternoon went back to bed. Lying in the bed and refusing to get up to go to the groups. States "I am in so much debt that I don't know how to get out of it". A) Given support, reassurance and praise. Encouraged to get out of the bed and attend the groups. Provided with a 1:1 and talked with Pt about healthy choices and choices that keep [place] stuck in the same situation. R) Pt got up in the morning and went to groups, but went back to bed, unwilling to get out of bed to attend the afternoon group.

## 2012-06-25 NOTE — Progress Notes (Signed)
D: Pt in bed for entire shift; awoke for short time when RN in to see roommate- assessment conducted briefly, as Pt wanted to return to sleep.  Reports no complaints.  Eye contact poor/minimal with flat facial expression.  Affect apathetic and mood severely depressed.  Speech logical and coherent but very quiet.  No initiation of verbal contact during assessment.  Pt unwilling to leave room (bed) to participate in unit activities and socialization.  Unable to assess thought process and content due to brevity of assessment.  However, denies AVH, SI/HI, and acute pain.  Contracting for safety.  Refused HS Trazodone, stated hasn't taken it since arriving at [facility].  A: Pt deflected attempts to support and encourage her in any way; this RN checked on Pt throughout shift but she remained asleep with no further opportunity for interaction.  No medications administered.  Q15 minute safety checks maintained as per unit policy.  R: Pt hypersomnolent and profoundly depressed; appears unable to readily process simple conversation.  Safety maintained. Dion Saucier RN

## 2012-06-25 NOTE — Progress Notes (Signed)
Group Topic/Focus:  Making Healthy Choices:   The focus of this group is to help patients identify negative/unhealthy choices they were using prior to admission and identify positive/healthier coping strategies to replace them upon discharge.  Participation Level:  Active  Participation Quality:  Appropriate  Affect:  Appropriate  Cognitive:  Appropriate  Insight:  Engaged  Engagement in Group:  Engaged  Additional Comments:  Shahrzad Koble A 06/25/2012   

## 2012-06-25 NOTE — Progress Notes (Signed)
[name] Progress Note  06/25/2012 1:51 PM Yvonne [name]  MRN:  [id number] Subjective:  "Im all right." Diagnosis:  Major depressive disorder   ADL's:  Intact  Sleep: Good  Appetite:  Poor  Suicidal Ideation:  denies Homicidal Ideation:  denies AEB (as evidenced by):  Psychiatric Specialty Exam: Review of Systems  Constitutional: Negative.  Negative for fever, chills, weight loss, malaise/fatigue and diaphoresis.  HENT: Negative for congestion and sore throat.   Eyes: Negative for blurred vision, double vision and photophobia.  Respiratory: Negative for cough, shortness of breath and wheezing.   Cardiovascular: Negative for chest pain, palpitations and PND.  Gastrointestinal: Negative for heartburn, nausea, vomiting, abdominal pain, diarrhea and constipation.  Musculoskeletal: Negative for myalgias, joint pain and falls.  Neurological: Negative for dizziness, tingling, tremors, sensory change, speech change, focal weakness, seizures, loss of consciousness, weakness and headaches.  Endo/Heme/Allergies: Negative for polydipsia. Does not bruise/bleed easily.  Psychiatric/Behavioral: Negative for depression, suicidal ideas, hallucinations, memory loss and substance abuse. The patient is not nervous/anxious and does not have insomnia.     Blood pressure 112/80, pulse 105, temperature 97.4 F (36.3 C), temperature source Oral, resp. rate 18, height 5\' 2"  (1.575 m), weight 63.504 kg (140 lb).Body mass index is 25.61 kg/(m^2).  General Appearance: Guarded  Eye Contact::  Poor  Speech:  Clear and Coherent  Volume:  Normal  Mood:  Depressed  Affect:  Depressed  Thought Process:  Goal Directed  Orientation:  Full (Time, Place, and Person)  Thought Content:  WDL  Suicidal Thoughts:  No  Homicidal Thoughts:  No  Memory:  Immediate;   Fair  Judgement:  Intact  Insight:  Lacking  Psychomotor Activity:  Normal  Concentration:  Fair  Recall:  Fair  Akathisia:  No  Handed:  Right   AIMS (if indicated):     Assets:  Communication Skills Desire for Improvement  Sleep:  Number of Hours: 6.5    Current Medications: Current Facility-Administered Medications  Medication Dose Route Frequency Provider Last Rate Last Dose  . acetaminophen (TYLENOL) tablet 650 mg  650 mg Oral Q6H PRN [name], PA      . alum & mag hydroxide-simeth (MAALOX/MYLANTA) 200-200-20 MG/5ML suspension 30 mL  30 mL Oral Q4H PRN [name], PA      . magnesium hydroxide (MILK OF MAGNESIA) suspension 30 mL  30 mL Oral Daily PRN [name], PA      . sertraline (ZOLOFT) tablet 50 mg  50 mg Oral Daily [name], NP   50 mg at 06/25/12 4098  . traZODone (DESYREL) tablet 50 mg  50 mg Oral QHS,MR X 1 [name], PA        Lab Results: No results found for this or any previous visit (from the past 48 hour(s)).  Physical Findings: AIMS: Facial and Oral Movements Muscles of Facial Expression: None, normal Lips and Perioral Area: None, normal Jaw: None, normal Tongue: None, normal,Extremity Movements Upper (arms, wrists, hands, fingers): None, normal Lower (legs, knees, ankles, toes): None, normal, Trunk Movements Neck, shoulders, hips: None, normal, Overall Severity Severity of abnormal movements (highest score from questions above): None, normal Incapacitation due to abnormal movements: None, normal Patient's awareness of abnormal movements (rate only patient's report): No Awareness, Dental Status Current problems with teeth and/or dentures?: No Does patient usually wear dentures?: No  CIWA:    COWS:     Treatment Plan Summary: Daily contact with patient to assess and evaluate symptoms  and progress in treatment Medication management  Plan: 1. Continue current plan of care as written.  Medical Decision Making Problem Points:  Established problem, stable/improving (1) Data Points:  Review or order medicine tests (1) Review of medication regiment & side effects (2)  I  certify that inpatient services furnished can reasonably be expected to improve the patient's condition.   Rona Ravens. Zlatan Hornback PAC 06/25/2012, 1:51 PM

## 2012-06-26 MED ORDER — LORAZEPAM 1 MG PO TABS
1.0000 mg | ORAL_TABLET | Freq: Two times a day (BID) | ORAL | Status: DC
  Filled 2012-06-26: qty 1

## 2012-06-26 MED ORDER — LORAZEPAM 1 MG PO TABS
1.0000 mg | ORAL_TABLET | Freq: Two times a day (BID) | ORAL | Status: DC | PRN
  Administered 2012-06-26: 1 mg via ORAL
  Filled 2012-06-26: qty 1

## 2012-06-26 NOTE — Progress Notes (Addendum)
D: Unable to assess Pt- slept through shift.  Resting in bed, eyes closed, respirations even and unlabored.   A: Will continue to monitor. No medications due or administered.  R: In no apparent distress. Dion Saucier RN

## 2012-06-26 NOTE — Progress Notes (Signed)
Patient ID: Yvonne [name], female   DOB: 31-Jan-1966, 46 y.o.   MRN: [id number] She has been in bed all shift except for a meeting with her family and discharge planning meeting. She stated that she had  Nothing at home. Talked in a low soft voice with her head hung down and there was no eye contact.  Her family stated that they had not ever seen her like this. With her familys encouragement she did take a prn of ativan.

## 2012-06-26 NOTE — Progress Notes (Signed)
Psychoeducational Group Note  Date:  06/26/2012 Time: 1100  Group Topic/Focus:  Self Care:   The focus of this group is to help patients understand the importance of self-care in order to improve or restore emotional, physical, spiritual, interpersonal, and financial health.  Participation Level: Did Not Attend  Participation Quality:  Not Applicable  Affect:  Not Applicable  Cognitive:  Not Applicable  Insight:  Not Applicable  Engagement in Group: Not Applicable  Additional Comments:  Patient did not attend group.  Karleen Hampshire Brittini 06/26/2012, 6:34 PM

## 2012-06-26 NOTE — Progress Notes (Signed)
Patient ID: Yvonne [name], female   DOB: [birth year], 46 y.o.   MRN: [id number] She was up this afternoon, was holding her head up and talking in a normal voice. She stated " I will go to groups tomorrow".

## 2012-06-26 NOTE — Progress Notes (Signed)
[facility] LCSW Group Therapy        Overcoming Obstacles 1:15 2:30 PM         06/26/2012 4:14 PM  Type of Therapy:  Group Therapy  Participation Level:  Did Not Attend  Participation Quality:    Affect:    Cognitive:    Insight:    Engagement in Therapy:   Modes of Intervention:    Summary of Progress/Problems:  Patient did not attend group.  Wynn Banker 06/26/2012, 4:14 PM

## 2012-06-26 NOTE — Progress Notes (Signed)
Psychoeducational Group Note  Date:  06/26/2012 Time:  2000  Group Topic/Focus:  Wrap-Up Group:   The focus of this group is to help patients review their daily goal of treatment and discuss progress on daily workbooks.  Participation Level: Did Not Attend  Participation Quality:  Not Applicable  Affect:  Not Applicable  Cognitive:  Not Applicable  Insight:  Not Applicable  Engagement in Group: Not Applicable  Additional Comments:  The patient did not attend group this evening since she was asleep.   Hashir Deleeuw S 06/26/2012, 1:56 AM

## 2012-06-26 NOTE — Tx Team (Signed)
Interdisciplinary Treatment Plan Update (Adult)  Date:  06/26/2012  Time Reviewed:  10:05 AM   Progress in Treatment: Attending groups:   Yes   Participating in groups:  Yes Taking medication as prescribed:  Yes Tolerating medication:  Yes Family/Significant othe contact made: Contact made with family Patient understands diagnosis:  Yes Discussing patient identified problems/goals with staff: Yes Medical problems stabilized or resolved: Yes Denies suicidal/homicidal ideation:Yes Issues/concerns per patient self-inventory:  Other:   New problem(s) identified:  Reason for Continuation of Hospitalization:  Interventions implemented related to continuation of hospitalization:  Medication Management; safety checks q 15 mins  Additional comments:  Estimated length of stay: Discharge today  Discharge Plan:  Home with outpatient follow up  New goal(s):  Review of initial/current patient goals per problem list:    1.  Goal(s): Eliminate SI/other thoughts of self harm   Met:  Yes  Target date: d/c  As evidenced by: Patient no longer endorsing SI/HI or other thoughts of self harm.    2.  Goal (s):Reduce depression/anxiety (Patient rates depression at four and anxiety at three)  Met: Yes  Target date: d/c  As evidenced by: Patient currently rating symptoms at four or below    3.  Goal(s):.stabilize on meds   Met:  Yes  Target date: d/c  As evidenced by: Patient reports being stabilized on medications - less symptomatic    4.  Goal(s): Refer for outpatient follow up   Met:  Yes  Target date: d/c  As evidenced by: Follow up appointment scheduled    Attendees: Patient:  Yvonne [name] 06/26/2012 10:05 AM  Physican:  Patrick North, MD 06/26/2012 10:05 AM  Nursing:  Neill Loft, RN 06/26/2012 10:05 AM   Nursing:   Quintella Reichert, RN 06/26/2012 10:05 AM   Clinical Social Worker:  Juline Patch, LCSW 06/26/2012 10:05 AM   Other: Serena Colonel, FNP  06/26/2012 10:05 AM   Other:   Roswell Miners, RN     06/26/2012 10:05 AM Other:        06/26/2012 10:05 AM

## 2012-06-26 NOTE — Progress Notes (Signed)
[facility] LCSW Aftercare Discharge Planning Group Note       8:30-9:30 AM  06/26/2012 10:10 AM  Participation Quality:    Affect:    Cognitive:    Insight:    Engagement in Group:    Modes of Intervention:    Summary of Progress/Problems:  Patient did not attend discharge planning group  Yvonne [name] 06/26/2012, 10:10 AM

## 2012-06-26 NOTE — Clinical Social Work Note (Signed)
Writer, MD, and RN met with patient, patient's sister and he niece in treatment team. Patient appeared to have decompensated over the weekend and family were concerned that she was not safe to discharge home. Patient was not taking meds, eating or attending groups.  Patient was informed that as she is able to talk with staff and taking meds she would be able to discharge.  MD terminated discharge for today.

## 2012-06-26 NOTE — Progress Notes (Signed)
[facility] MD Progress Note  06/26/2012 10:25 AM Alveda Vanhorne  MRN:  [id number]  S: Patient has not been participating in groups, has been refusing to take her medications. She stated to the nurses that she does not have anything to go home to. She continues to perseverate on her financial situation and possibility of losing her job. In treatment team, she did not make eye contact with any one, talking in a mumbling fashion, not responding to questions.  Diagnosis:   Axis I: Depressive Disorder NOS Axis II: Deferred Axis III:  Past Medical History  Diagnosis Date  . No pertinent past medical history    Axis IV: economic problems and occupational problems Axis V: 41-50 serious symptoms  ADL's:  Impaired  Sleep: Fair  Appetite:  Fair  Psychiatric Specialty Exam: Review of Systems  Constitutional: Negative.   HENT: Negative.   Eyes: Negative.   Respiratory: Negative.   Cardiovascular: Negative.   Gastrointestinal: Negative.   Genitourinary: Negative.   Musculoskeletal: Negative.   Skin: Negative.   Neurological: Negative.   Endo/Heme/Allergies: Negative.   Psychiatric/Behavioral: Positive for depression. The patient is nervous/anxious and has insomnia.        Hopelessness    Blood pressure 102/81, pulse 103, temperature 97.6 F (36.4 C), temperature source Oral, resp. rate 18, height 5\' 2"  (1.575 m), weight 63.504 kg (140 lb).Body mass index is 25.61 kg/(m^2).  General Appearance: Disheveled  Eye Contact::  None  Speech:  Mumbling, whispering  Volume:  Decreased  Mood:  Anxious, Dysphoric and Hopeless  Affect:  Flat  Thought Process:  Disorganized  Orientation:  Full (Time, Place, and Person)  Thought Content:  Rumination  Suicidal Thoughts:  No  Homicidal Thoughts:  No  Memory:  Immediate;   Fair Recent;   Fair Remote;   Fair  Judgement:  Poor  Insight:  Lacking  Psychomotor Activity:  Decreased  Concentration:  Fair  Recall:  Fair  Akathisia:  No  Handed:   Right  AIMS (if indicated):     Assets:  Physical Health  Sleep:  Number of Hours: 6.75    Current Medications: Current Facility-Administered Medications  Medication Dose Route Frequency Provider Last Rate Last Dose  . acetaminophen (TYLENOL) tablet 650 mg  650 mg Oral Q6H PRN [name], PA      . alum & mag hydroxide-simeth (MAALOX/MYLANTA) 200-200-20 MG/5ML suspension 30 mL  30 mL Oral Q4H PRN [name], PA      . magnesium hydroxide (MILK OF MAGNESIA) suspension 30 mL  30 mL Oral Daily PRN [name], PA      . sertraline (ZOLOFT) tablet 50 mg  50 mg Oral Daily [name], NP   50 mg at 06/25/12 4098  . traZODone (DESYREL) tablet 50 mg  50 mg Oral QHS,MR X 1 [name], PA        Lab Results: No results found for this or any previous visit (from the past 48 hour(s)).  Physical Findings: AIMS: Facial and Oral Movements Muscles of Facial Expression: None, normal Lips and Perioral Area: None, normal Jaw: None, normal Tongue: None, normal,Extremity Movements Upper (arms, wrists, hands, fingers): None, normal Lower (legs, knees, ankles, toes): None, normal, Trunk Movements Neck, shoulders, hips: None, normal, Overall Severity Severity of abnormal movements (highest score from questions above): None, normal Incapacitation due to abnormal movements: None, normal Patient's awareness of abnormal movements (rate only patient's report): No Awareness, Dental Status Current problems with teeth and/or dentures?:  No Does patient usually wear dentures?: No  CIWA:    COWS:     Treatment Plan Summary: Daily contact with patient to assess and evaluate symptoms and progress in treatment Medication management  Plan: Obtain a second opinion evaluation for forced medication. Family session conducted with patient`s family prior to discharge and to discuss ongoing treatment.   Medical Decision Making Problem Points:  Established problem, worsening (2), Review of last  therapy session (1) and Review of psycho-social stressors (1) Data Points:  Review of medication regiment & side effects (2) Review of new medications or change in dosage (2)  I certify that inpatient services furnished can reasonably be expected to improve the patient's condition.   Aurorah Schlachter 06/26/2012, 10:25 AM

## 2012-06-26 NOTE — Progress Notes (Signed)
[facility] Adult Case Management Discharge Plan :  Will you be returning to the same living situation after discharge: Yes,  Patient to return to her home At discharge, do you have transportation home?:Yes,  Patient has transportation home Do you have the ability to pay for your medications:  Yes.  Patient can afford medications  Release of information consent forms completed and in the chart;  Patient's signature needed at discharge.  Patient to Follow up at: Follow-up Information    Follow up with Dr. Lolly Mustache - [facility]. On 07/11/2012. (You are scheduled to see Dr. Lolly Mustache on Tuesday, July 11, 2012 at 1:30 PM.  Please arrive for appointment at [facility])    Contact information:   [address] [place], [place]   [address]  [phone]         Patient denies SI/HI:   Yes,  Patient did not endorse SI on admission or during hospitatlization    Safety Planning and Suicide Prevention discussed:  Yes,  Reviewed with all patients attending discharge planning groups.  Wynn Banker 06/26/2012, 10:06 AM

## 2012-06-27 MED ORDER — LORAZEPAM 0.5 MG PO TABS
0.5000 mg | ORAL_TABLET | Freq: Two times a day (BID) | ORAL | Status: DC
  Administered 2012-06-27 – 2012-07-03 (×11): 0.5 mg via ORAL
  Filled 2012-06-27 (×12): qty 1

## 2012-06-27 NOTE — Progress Notes (Signed)
Psychoeducational Group Note  Date:  06/27/2012 Time:  8:00PM  Group Topic/Focus:  Wrap-Up Group:   The focus of this group is to help patients review their daily goal of treatment and discuss progress on daily workbooks.  Participation Level:  Did Not Attend    Gerrit Heck 06/27/2012, 10:32 PM

## 2012-06-27 NOTE — Progress Notes (Signed)
[facility] MD Progress Note  06/27/2012 12:52 PM Yvonne [name]  MRN:  [id number]  Subjective:  Patient took her medications yesterday, mood slightly improved. She is talking today, looking more animated. Talking about going back to work.  Diagnosis:   Axis I: Anxiety Disorder NOS and Depressive Disorder NOS Axis II: Deferred Axis III:  Past Medical History  Diagnosis Date  . No pertinent past medical history    Axis IV: occupational problems Axis V: 51-60 moderate symptoms  ADL's:  Intact  Sleep: Fair  Appetite:  Fair   Psychiatric Specialty Exam: Review of Systems  Constitutional: Negative.   HENT: Negative.   Eyes: Negative.   Respiratory: Negative.   Cardiovascular: Negative.   Gastrointestinal: Negative.   Genitourinary: Negative.   Musculoskeletal: Negative.   Skin: Negative.   Neurological: Negative.   Endo/Heme/Allergies: Negative.   Psychiatric/Behavioral: Positive for depression. The patient is nervous/anxious.     Blood pressure 103/81, pulse 97, temperature 97.6 F (36.4 C), temperature source Oral, resp. rate 16, height 5\' 2"  (1.575 m), weight 63.504 kg (140 lb).Body mass index is 25.61 kg/(m^2).  General Appearance: Disheveled  Eye Solicitor::  Fair  Speech:  Slow  Volume:  Decreased  Mood:  Dysphoric  Affect:  Blunt  Thought Process:  Circumstantial  Orientation:  Full (Time, Place, and Person)  Thought Content:  WDL  Suicidal Thoughts:  No  Homicidal Thoughts:  No  Memory:  Immediate;   Fair Recent;   Fair Remote;   Fair  Judgement:  Impaired  Insight:  Present  Psychomotor Activity:  Decreased  Concentration:  Fair  Recall:  Fair  Akathisia:  No  Handed:  Right  AIMS (if indicated):     Assets:  Social Support  Sleep:  Number of Hours: 6.25    Current Medications: Current Facility-Administered Medications  Medication Dose Route Frequency Provider Last Rate Last Dose  . acetaminophen (TYLENOL) tablet 650 mg  650 mg Oral Q6H PRN [name], PA      . alum & mag hydroxide-simeth (MAALOX/MYLANTA) 200-200-20 MG/5ML suspension 30 mL  30 mL Oral Q4H PRN [name], PA      . LORazepam (ATIVAN) tablet 0.5 mg  0.5 mg Oral BID Laniece Hornbaker, MD      . LORazepam (ATIVAN) tablet 1 mg  1 mg Oral BID PRN [name], MD   1 mg at 06/26/12 1308  . magnesium hydroxide (MILK OF MAGNESIA) suspension 30 mL  30 mL Oral Daily PRN [name], PA      . sertraline (ZOLOFT) tablet 50 mg  50 mg Oral Daily [name], NP   50 mg at 06/27/12 0805  . traZODone (DESYREL) tablet 50 mg  50 mg Oral QHS,MR X 1 [name], PA        Lab Results: No results found for this or any previous visit (from the past 48 hour(s)).  Physical Findings: AIMS: Facial and Oral Movements Muscles of Facial Expression: None, normal Lips and Perioral Area: None, normal Jaw: None, normal Tongue: None, normal,Extremity Movements Upper (arms, wrists, hands, fingers): None, normal Lower (legs, knees, ankles, toes): None, normal, Trunk Movements Neck, shoulders, hips: None, normal, Overall Severity Severity of abnormal movements (highest score from questions above): None, normal Incapacitation due to abnormal movements: None, normal Patient's awareness of abnormal movements (rate only patient's report): No Awareness, Dental Status Current problems with teeth and/or dentures?: No Does patient usually wear dentures?: No  CIWA:  COWS:     Treatment Plan Summary: Daily contact with patient to assess and evaluate symptoms and progress in treatment Medication management  Plan: Decrease Ativan to 0.5mg  po bid. Continue current plan of care. Plan for discharge tomorrow.  Medical Decision Making Problem Points:  Established problem, stable/improving (1), Review of last therapy session (1) and Review of psycho-social stressors (1) Data Points:  Review of medication regiment & side effects (2) Review of new medications or change in dosage (2)  I  certify that inpatient services furnished can reasonably be expected to improve the patient's condition.   Wafaa Deemer 06/27/2012, 12:52 PM

## 2012-06-27 NOTE — Clinical Social Work Note (Signed)
Writer met with patient and assisted her in contact HR for [employer] and [employer].  Sister had provided contact info for [employer] but gave the wrong phone number.  [employer] to send STD forms.

## 2012-06-27 NOTE — Progress Notes (Signed)
Psychoeducational Group Note  Date:  06/27/2012 Time:  1100  Group Topic/Focus:  Wellness Toolbox:   The focus of this group is to discuss various aspects of wellness, balancing those aspects and exploring ways to increase the ability to experience wellness.  Patients will create a wellness toolbox for use upon discharge.  Participation Level:  Active  Participation Quality:  Appropriate, Attentive, Sharing and Supportive  Affect:  Appropriate  Cognitive:  Alert and Appropriate  Insight:  Supportive  Engagement in Group:  Supportive  Additional Comments:  Productive group  [name] 06/27/2012, 6:30 PM

## 2012-06-27 NOTE — Progress Notes (Signed)
[facility] LCSW Aftercare Discharge Planning Group Note  8:30-9:30  06/27/2012 4:35 PM  Participation Quality:  Appropriate  Affect:  Appropriate  Cognitive:  Appropriate  Insight:  Limited  Engagement in Group:  Limited  Modes of Intervention:  Exploration, Problem-solving and Support  Summary of Progress/Problems:  Patient reported doing better and continues to deny SI/HI.  She rates all symptoms at zero.  Wynn Banker 06/27/2012, 4:35 PM

## 2012-06-27 NOTE — Progress Notes (Signed)
Currently resting quietly in bed with eyes closed. Respirations even and unlabored. No acute distress noted. Safety has been maintained with Q15 minute observation. Will continue current POC 

## 2012-06-27 NOTE — Progress Notes (Signed)
D: Patient denies SI/HI and A/V hallucinations; patient reports sleep to be well; reports appetite to be good ; reports energy level is normal ; reports ability to pay attention is good; rates depression as -010; rates hopelessness 0/10; rates anxiety as /10; patient has no other complaints at this time  A: Monitored q 15 minutes; patient encouraged to attend groups; patient educated about medications; patient given medications per physician orders; patient encouraged to express feelings and/or concerns  R: Patient is minimal but appropriate to circumstance; patient's interaction with staff and peers is appropriate but little interaction; patient is taking medications as prescribed and tolerating medications; patient is attending most groups

## 2012-06-28 NOTE — Progress Notes (Signed)
[facility] MD Progress Note  06/28/2012 11:44 AM Yvonne [name]  MRN:  [id number] Subjective:  Patient continues to be evasive. Mumbling and not answering questions. Has been declining medication intermittently, took meds this morning.  Diagnosis:   Axis I: Anxiety Disorder NOS and Depressive Disorder NOS Axis II: Deferred Axis III:  Past Medical History  Diagnosis Date  . No pertinent past medical history    Axis IV: economic problems and occupational problems Axis V: 51-60 moderate symptoms  ADL's:  Intact  Sleep: Fair  Appetite:  Fair   Psychiatric Specialty Exam: Review of Systems  Constitutional: Negative.   HENT: Negative.   Eyes: Negative.   Respiratory: Negative.   Cardiovascular: Negative.   Gastrointestinal: Negative.   Genitourinary: Negative.   Musculoskeletal: Negative.   Skin: Negative.   Neurological: Negative.   Endo/Heme/Allergies: Negative.   Psychiatric/Behavioral: Positive for depression. The patient is nervous/anxious.     Blood pressure 112/82, pulse 101, temperature 98.1 F (36.7 C), temperature source Oral, resp. rate 16, height 5\' 2"  (1.575 m), weight 63.504 kg (140 lb).Body mass index is 25.61 kg/(m^2).  General Appearance: Disheveled  Eye Contact::  Minimal  Speech:  Minimal, mumbling  Volume:  Normal  Mood:  Anxious and Dysphoric  Affect:  Flat  Thought Process:  Circumstantial  Orientation:  Full (Time, Place, and Person)  Thought Content:  Rumination  Suicidal Thoughts:  No  Homicidal Thoughts:  No  Memory:  Immediate;   Fair Recent;   Fair Remote;   Fair  Judgement:  Impaired  Insight:  Present  Psychomotor Activity:  Normal  Concentration:  Fair  Recall:  Fair  Akathisia:  No  Handed:  Right  AIMS (if indicated):     Assets:  Social Support  Sleep:  Number of Hours: 6.25    Current Medications: Current Facility-Administered Medications  Medication Dose Route Frequency Provider Last Rate Last Dose  . acetaminophen (TYLENOL)  tablet 650 mg  650 mg Oral Q6H PRN [name], PA      . alum & mag hydroxide-simeth (MAALOX/MYLANTA) 200-200-20 MG/5ML suspension 30 mL  30 mL Oral Q4H PRN [name], PA      . LORazepam (ATIVAN) tablet 0.5 mg  0.5 mg Oral BID Jatavious Peppard, MD   0.5 mg at 06/28/12 0802  . LORazepam (ATIVAN) tablet 1 mg  1 mg Oral BID PRN [name], MD   1 mg at 06/26/12 1308  . magnesium hydroxide (MILK OF MAGNESIA) suspension 30 mL  30 mL Oral Daily PRN [name], PA      . sertraline (ZOLOFT) tablet 50 mg  50 mg Oral Daily [name], NP   50 mg at 06/28/12 0802  . traZODone (DESYREL) tablet 50 mg  50 mg Oral QHS,MR X 1 [name], PA        Lab Results: No results found for this or any previous visit (from the past 48 hour(s)).  Physical Findings: AIMS: Facial and Oral Movements Muscles of Facial Expression: None, normal Lips and Perioral Area: None, normal Jaw: None, normal Tongue: None, normal,Extremity Movements Upper (arms, wrists, hands, fingers): None, normal Lower (legs, knees, ankles, toes): None, normal, Trunk Movements Neck, shoulders, hips: None, normal, Overall Severity Severity of abnormal movements (highest score from questions above): None, normal Incapacitation due to abnormal movements: None, normal Patient's awareness of abnormal movements (rate only patient's report): No Awareness, Dental Status Current problems with teeth and/or dentures?: No Does patient usually wear  dentures?: No  CIWA:    COWS:     Treatment Plan Summary: Daily contact with patient to assess and evaluate symptoms and progress in treatment Medication management  Plan: Encourage patient to take her medications, involve family to encourage patient to take her meds. Continue to monitor.  Medical Decision Making Problem Points:  Established problem, worsening (2), Review of last therapy session (1) and Review of psycho-social stressors (1) Data Points:  2.  I certify that  inpatient services furnished can reasonably be expected to improve the patient's condition.   Kaydin Labo 06/28/2012, 11:44 AM

## 2012-06-28 NOTE — Progress Notes (Signed)
[facility] Group Notes:  (Counselor/Nursing/MHT/Case Management/Adjunct)  Type of Therapy:  Psychoeducational Skills  Participation Level:  Minimal  Participation Quality:  Resistant  Affect:  Depressed, Irritable and gaurded  Cognitive:  Appropriate and Oriented  Insight:  Lacking  Engagement in Group:  None  Engagement in Therapy:  n/a  Modes of Intervention:  Activity, Discussion, Education, Problem-solving, Rapport Building, Socialization and Support  Summary of Progress/Problems: Yvonne [name] attended with prompting a psychoeducational group that focused on using quality time with support systems/individuals to engage in health coping skills. Yvonne [name] participated with encouragement in activity guessing about self and peers. Yvonne [name] was quiet but did respond when prompted while group discussed who their support systems are, how they can spend positive quality time with them as a coping skills and a way to strengthen their relationship. Yvonne [name] was given a homework assignment to find two ways to improve her support systems and twenty activities she can do to spend quality time with her supports.   Wandra Scot 06/28/2012 2:59 PM

## 2012-06-28 NOTE — Progress Notes (Signed)
[facility] LCSW Group Therapy      Emotional Regulation 1:15 - 2:30 PM           06/28/2012 2:29 PM  Type of Therapy:  Group Therapy  Participation Level:  Did Not Attend  Participation Quality:    Affect:    Cognitive:    Insight:    Engagement in Therapy:    Modes of Intervention:    Summary of Progress/Problems:  Wynn Banker 06/28/2012, 2:29 PM

## 2012-06-28 NOTE — Progress Notes (Signed)
Pt has been in bed all evening.  This Clinical research associate was unable to engage pt in conversation.  She denies SI/HI/AV.  She repeatedly said she was fine.  She did not attend evening group.  She voiced no needs/concerns.  Her sister called earlier saying she wanted to make staff aware that pt is extremely anxious about the uncertainty of losing her job.  She apparently was up earlier in the day and participated in activities.  Pt was encouraged to make her needs known to staff.  Safety maintained with q15 minute checks.

## 2012-06-28 NOTE — Progress Notes (Signed)
D: Patient denies SI/HI and A/V hallucinations and declined self inventory;patient reports feeling bad and will not forward a lot of information; patient stated "I just don't feel good"  A: Monitored q 15 minutes; patient encouraged to attend groups; patient educated about medications; patient given medications per physician orders; patient encouraged to express feelings and/or concerns; physician and treatment team made aware of patient's condition or status and physician reported she will follow up  R: Patient is depressed and laying in the bed and states there is a situation outside that is wrong and she would not forward any more information; patient  patient's interaction with staff and peers very minimal;  patient is taking medications as prescribed and tolerating medications; patient is not attending groups

## 2012-06-29 DIAGNOSIS — F411 Generalized anxiety disorder: Secondary | ICD-10-CM

## 2012-06-29 MED ORDER — ENSURE COMPLETE PO LIQD: 237.0000 mL | Freq: Two times a day (BID) | ORAL | Status: DC

## 2012-06-29 NOTE — Progress Notes (Signed)
[facility] MD Progress Note  06/29/2012 9:14 AM Yvonne [name]  MRN:  [id number] Subjective:  5/10 depression Diagnosis:   Axis I: Anxiety Disorder NOS and Depressive Disorder NOS Axis II: Deferred Axis III:  Past Medical History  Diagnosis Date  . No pertinent past medical history    Axis IV: economic problems, occupational problems, other psychosocial or environmental problems, problems related to social environment and problems with primary support group Axis V: 41-50 serious symptoms  ADL's:  Intact  Sleep: Good  Appetite:  Fair  Suicidal Ideation:  Denies Homicidal Ideation:  Denies  Psychiatric Specialty Exam: Review of Systems  Constitutional: Negative.   HENT: Negative.   Eyes: Negative.   Respiratory: Negative.   Cardiovascular: Negative.   Gastrointestinal: Negative.   Genitourinary: Negative.   Musculoskeletal: Negative.   Skin: Negative.   Neurological: Negative.   Endo/Heme/Allergies: Negative.   Psychiatric/Behavioral: Positive for depression.    Blood pressure 113/70, pulse 80, temperature 98.2 F (36.8 C), temperature source Oral, resp. rate 17, height 5\' 2"  (1.575 m), weight 63.504 kg (140 lb).Body mass index is 25.61 kg/(m^2).  General Appearance: Casual  Eye Contact::  Fair  Speech:  Slow  Volume:  Decreased  Mood:  Depressed  Affect:  Congruent  Thought Process:  Coherent  Orientation:  Full (Time, Place, and Person)  Thought Content:  WDL  Suicidal Thoughts:  No  Homicidal Thoughts:  No  Memory:  Immediate;   Fair Recent;   Fair Remote;   Fair  Judgement:  Intact  Insight:  Fair  Psychomotor Activity:  Decreased  Concentration:  Fair  Recall:  Fair  Akathisia:  No  Handed:  Right  AIMS (if indicated):     Assets:  Communication Skills Desire for Improvement Social Support  Sleep:  Number of Hours: 5    Current Medications: Current Facility-Administered Medications  Medication Dose Route Frequency Provider Last Rate Last Dose  .  acetaminophen (TYLENOL) tablet 650 mg  650 mg Oral Q6H PRN [name], PA      . alum & mag hydroxide-simeth (MAALOX/MYLANTA) 200-200-20 MG/5ML suspension 30 mL  30 mL Oral Q4H PRN [name], PA      . LORazepam (ATIVAN) tablet 0.5 mg  0.5 mg Oral BID [name], MD   0.5 mg at 06/29/12 0804  . LORazepam (ATIVAN) tablet 1 mg  1 mg Oral BID PRN [name], MD   1 mg at 06/26/12 1308  . magnesium hydroxide (MILK OF MAGNESIA) suspension 30 mL  30 mL Oral Daily PRN [name], PA      . sertraline (ZOLOFT) tablet 50 mg  50 mg Oral Daily [name], NP   50 mg at 06/29/12 0804  . traZODone (DESYREL) tablet 50 mg  50 mg Oral QHS,MR X 1 [name], PA        Lab Results: No results found for this or any previous visit (from the past 48 hour(s)).  Physical Findings: AIMS: Facial and Oral Movements Muscles of Facial Expression: None, normal Lips and Perioral Area: None, normal Jaw: None, normal Tongue: None, normal,Extremity Movements Upper (arms, wrists, hands, fingers): None, normal Lower (legs, knees, ankles, toes): None, normal, Trunk Movements Neck, shoulders, hips: None, normal, Overall Severity Severity of abnormal movements (highest score from questions above): None, normal Incapacitation due to abnormal movements: None, normal Patient's awareness of abnormal movements (rate only patient's report): No Awareness, Dental Status Current problems with teeth and/or dentures?: No Does patient  usually wear dentures?: No  CIWA:    COWS:     Treatment Plan Summary: Daily contact with patient to assess and evaluate symptoms and progress in treatment Medication management  Plan:  Review of chart, notes, vital signs, and medications 1-Individual and group therapy--patient not coming to group therapy, encouraged her to attend and stressed the importance, reluctant to talk one-one without much encouragement 2-Medication management for depression and  anxiety--non-compliant at times, encouragement provided 3-Discharge plan in progress to provide follow-up and prevent relapse of depression 4-Supportive, encouraging environment to optimize care 5-Patient's main stressor appears to be work and dread of returning  Medical Decision Making Problem Points:  Established problem, stable/improving (1) and Review of psycho-social stressors (1) Data Points:  Review of medication regiment & side effects (2)  I certify that inpatient services furnished can reasonably be expected to improve the patient's condition.   [name], PMH-NP 06/29/2012, 9:14 AM

## 2012-06-29 NOTE — Clinical Social Work Note (Signed)
         [facility] LCSW Group Therapy       Living a Balanced Life  1:15-2:30            06/29/2012 3:59 PM   Type of Therapy:  Group Therapy  Participation Level:  Appropriate  Participation Quality:  Limited  Affect:  Appropriate  Cognitive:  Attentive Appropriate  Insight: Limited  Engagement in Therapy:  Limited  Modes of Intervention:  Discussion Exploration Problem-Solving Supportive Education  Summary of Progress/Problems:  Patient gave her name but no further participation.  She listened attentively to speaker from [facility].  Wynn Banker 06/29/2012 3:59 PM

## 2012-06-29 NOTE — Progress Notes (Signed)
Patient ID: Yvonne [name], female   DOB: 01/23/1966, 46 y.o.   MRN: [id number] D: Patient in room on approach. Pt presented with depressed mood and flat affect. Pt is isolative in room. Pt did not attend evening wrap up group. Pt refused evening scheduled medications. Denies SI/HI/AV and pain. Cooperative with assessment. No acute distressed noted.   A: Met with pt 1:1.  Pt encouraged to come to staff with any question or concerns. Pt encouraged to attend groups.Safety has been maintained with Q15 minutes observation.   R: Patient remains safe. Safety has been maintained Q15 and continue current POC.

## 2012-06-29 NOTE — Progress Notes (Signed)
Nutrition Brief Note  Patient identified on the Malnutrition Screening Tool (MST) Report  Body mass index is 25.61 kg/(m^2). Pt meets criteria for overweight based on current BMI.   Pt reports eating 2 meals/day PTA and eating the same during admission. Pt denies any weight loss. Pt reports she doesn't like some of the food. Pt interested in getting Ensure during admission - will order. No nutrition interventions warranted at this time. If nutrition issues arise, please consult RD.   Levon Hedger MS, RD, LDN [phone] Pager [phone] After Hours Pager

## 2012-06-29 NOTE — Clinical Social Work Note (Signed)
Writer attempted to meet with patient who was in bed in her room.  Patient did not respond to Clinical research associate.  Patient seen up in hall way prior to lunch.  She shrugged her shoulder when asked how she was doing and did not give a verbal reply.  Writer spoke with patient's sister by phone and in lobby to answer questions about Disability/FMLA forms.  Sister was informed providers are familiar with forms and will know how to follow up past patient's hospitalizations.

## 2012-06-29 NOTE — Progress Notes (Signed)
Psychoeducational Group Note  Date:  06/29/2012 Time:  2015  Group Topic/Focus:  Wrap-Up Group:   The focus of this group is to help patients review their daily goal of treatment and discuss progress on daily workbooks.  Participation Level:  Did Not Attend  Participation Quality:    Affect:    Cognitive:    Insight:    Engagement in Group:    Additional Comments:  Patient remained in bed, awake and asleep, for the duration of wrap-up group tonight.  Obelia Bonello, Newton Pigg 06/29/2012, 12:18 AM

## 2012-06-29 NOTE — Progress Notes (Signed)
Psychoeducational Group Note  Date:  06/29/2012 Time:  1100   Group Topic/Focus:  Healthy Communication:   The focus of this group is to discuss communication, barriers to communication, as well as healthy ways to communicate with others.  Participation Level: Did Not Attend  Participation Quality:  Not Applicable  Affect:  Not Applicable  Cognitive:  Not Applicable  Insight:  Not Applicable  Engagement in Group: Not Applicable  Additional Comments:    Noah Charon 06/29/2012, 12:13 PM

## 2012-06-29 NOTE — Progress Notes (Signed)
D:  Patient stayed in bed in her room much of the day.  Keeps the covers over her head and does not engage in conversation without a great deal of prompting.  Has not attended groups today.  Did take medications this morning.  Patient is disheveled and unkempt today.  She would not fill out her self inventory form today.  A:  Attempted to engage patient in conversation were futile.  Explained her medications to her and why she was taking them.   R:  Patient verbalized understanding of medications, had no questions.  Denies suicidal ideation.

## 2012-06-29 NOTE — Progress Notes (Signed)
Psychoeducational Group Note  Date:  06/29/2012 Time:  1000  Group Topic/Focus:  Self Esteem Action Plan:   The focus of this group is to help patients create a plan to continue to build self-esteem after discharge.  Participation Level: Did Not Attend  Participation Quality:  Not Applicable  Affect:  Not Applicable  Cognitive:  Not Applicable  Insight:  Not Applicable  Engagement in Group: Not Applicable  Additional Comments:  Patient did not attend group.  Karleen Hampshire Brittini 06/29/2012, 11:14 AM

## 2012-06-30 MED ORDER — SERTRALINE HCL 50 MG PO TABS
75.0000 mg | ORAL_TABLET | Freq: Every day | ORAL | Status: DC
  Administered 2012-07-01 – 2012-07-03 (×3): 75 mg via ORAL
  Filled 2012-06-30 (×6): qty 1

## 2012-06-30 NOTE — Progress Notes (Signed)
Reviewed

## 2012-06-30 NOTE — Clinical Social Work Note (Signed)
[facility] LCSW Group Therapy  06/30/2012 6:19 PM  Type of Therapy:  Group Therapy  Participation Level:  Minimal  Participation Quality:  Hesitant at first and later appropriate  Affect:  Depressed  Cognitive:  Oriented  Insight:  Improving  Engagement in Therapy:  Improving  Modes of Intervention:  Clarification, Socialization and Support  Summary of Progress/Problems:  Focus of group therapy session was to identify what balance would look like for individual patients after discharge.  Patients choose from group of photographs a photo that had meaning for them in reference to balance, Yvonne [name] choose a photo of a path through a wooded area.  She shared "this looks like dry land, but moisture will come; these trees haven't given up." Patient was given encouragement from others as this was first time she had participated in group.   Yvonne [name] 06/30/2012, 6:19 PM

## 2012-06-30 NOTE — Progress Notes (Signed)
Psychoeducational Group Note  Date:  06/30/2012 Time:  1100  Group Topic/Focus:  Early Warning Signs:   The focus of this group is to help patients identify signs or symptoms they exhibit before slipping into an unhealthy state or crisis.  Participation Level:  Active  Participation Quality:  Resistant  Affect:  Flat  Cognitive:  Appropriate  Insight:  Supportive  Engagement in Group:  Supportive  Additional Comments:  none  Azion Centrella E 06/30/2012, 1:55 PM

## 2012-06-30 NOTE — Progress Notes (Signed)
[facility] LCSW Aftercare Discharge Planning Group Note  06/30/2012 12:59 PM  Participation Quality:  Did not attend    Nail, Catalina Gravel 06/30/2012, 12:59 PM

## 2012-06-30 NOTE — Progress Notes (Signed)
[facility] MD Progress Note  06/30/2012 10:57 AM Yvonne [name]  MRN:  [id number] Subjective:  Patient continues to resist taking medications, takes medicine after coaxing. Continues to lie in bed mostly and mumbles , does not answer questions or engage in conversation.  Diagnosis:   Axis I: Anxiety Disorder NOS and Depressive Disorder NOS Axis II: Deferred Axis III:  Past Medical History  Diagnosis Date  . No pertinent past medical history    Axis IV: economic problems, occupational problems and other psychosocial or environmental problems Axis V: 41-50 serious symptoms  ADL's:  Impaired  Sleep: Fair  Appetite:  Fair  Psychiatric Specialty Exam: Review of Systems  Constitutional: Negative.   HENT: Negative.   Eyes: Negative.   Respiratory: Negative.   Cardiovascular: Negative.   Gastrointestinal: Negative.   Genitourinary: Negative.   Musculoskeletal: Negative.   Skin: Negative.   Neurological: Negative.   Endo/Heme/Allergies: Negative.   Psychiatric/Behavioral: Positive for depression. The patient is nervous/anxious.     Blood pressure 98/65, pulse 108, temperature 98.6 F (37 C), temperature source Oral, resp. rate 18, height 5\' 2"  (1.575 m), weight 63.504 kg (140 lb).Body mass index is 25.61 kg/(m^2).  General Appearance: Disheveled  Eye Contact::  Minimal  Speech:  Slow, mumbling  Volume:  Decreased  Mood:  Dysphoric and Hopeless  Affect:  Constricted and Flat  Thought Process:  Tangential  Orientation:  Full (Time, Place, and Person)  Thought Content:  Rumination  Suicidal Thoughts:  No  Homicidal Thoughts:  No  Memory:  Immediate;   Fair Recent;   Fair Remote;   Fair  Judgement:  Impaired  Insight:  Present  Psychomotor Activity:  Decreased  Concentration:  Fair  Recall:  Fair  Akathisia:  No  Handed:  Right  AIMS (if indicated):     Assets:  Housing Social Support  Sleep:  Number of Hours: 6.25    Current Medications: Current Facility-Administered  Medications  Medication Dose Route Frequency Provider Last Rate Last Dose  . acetaminophen (TYLENOL) tablet 650 mg  650 mg Oral Q6H PRN [name], PA      . alum & mag hydroxide-simeth (MAALOX/MYLANTA) 200-200-20 MG/5ML suspension 30 mL  30 mL Oral Q4H PRN [name], PA      . feeding supplement (ENSURE COMPLETE) liquid 237 mL  237 mL Oral BID BM Lavena Bullion, RD      . LORazepam (ATIVAN) tablet 0.5 mg  0.5 mg Oral BID Raylei Losurdo, MD   0.5 mg at 06/30/12 0931  . LORazepam (ATIVAN) tablet 1 mg  1 mg Oral BID PRN [name], MD   1 mg at 06/26/12 1308  . magnesium hydroxide (MILK OF MAGNESIA) suspension 30 mL  30 mL Oral Daily PRN [name], PA      . sertraline (ZOLOFT) tablet 75 mg  75 mg Oral Daily Kyrene Longan, MD      . traZODone (DESYREL) tablet 50 mg  50 mg Oral QHS,MR X 1 [name], PA        Lab Results: No results found for this or any previous visit (from the past 48 hour(s)).  Physical Findings: AIMS: Facial and Oral Movements Muscles of Facial Expression: None, normal Lips and Perioral Area: None, normal Jaw: None, normal Tongue: None, normal,Extremity Movements Upper (arms, wrists, hands, fingers): None, normal Lower (legs, knees, ankles, toes): None, normal, Trunk Movements Neck, shoulders, hips: None, normal, Overall Severity Severity of abnormal movements (highest score from questions  above): None, normal Incapacitation due to abnormal movements: None, normal Patient's awareness of abnormal movements (rate only patient's report): No Awareness, Dental Status Current problems with teeth and/or dentures?: No Does patient usually wear dentures?: No  CIWA:    COWS:     Treatment Plan Summary: Daily contact with patient to assess and evaluate symptoms and progress in treatment Medication management  Plan: Increase Zoloft to 75mg  po qd. Encourage compliance with medications and to attend groups.  Medical Decision Making Problem  Points:  Established problem, stable/improving (1), Review of last therapy session (1) and Review of psycho-social stressors (1) Data Points:  Review of medication regiment & side effects (2) Review of new medications or change in dosage (2)  I certify that inpatient services furnished can reasonably be expected to improve the patient's condition.   Ofelia Podolski 06/30/2012, 10:57 AM

## 2012-06-30 NOTE — Progress Notes (Signed)
Psychoeducational Group Note  Date:  06/30/2012 Time:  2000  Group Topic/Focus:  Karaoke night   Participation Level:  Did Not Attend  Participation Quality:    Affect:    Cognitive:    Insight:    Engagement in Group:    Additional Comments:  Pt didn't attend karaoke group this evening.   Shelvia Fojtik A 06/30/2012, 3:47 AM

## 2012-06-30 NOTE — Progress Notes (Signed)
Psychoeducational Group Note  Date:  06/30/2012 Time:  2000  Group Topic/Focus:  Goals Group:   The focus of this group is to help patients establish daily goals to achieve during treatment and discuss how the patient can incorporate goal setting into their daily lives to aide in recovery.  Participation Level:  Did Not Attend  Participation Quality:  Drowsy  Affect:  Appropriate  Cognitive:  Appropriate  Insight:  None  Engagement in Group:  None  Additional Comments:  Patient was invited to group, but did not attend.   Lyndee Hensen 06/30/2012, 10:04 PM

## 2012-06-30 NOTE — Progress Notes (Addendum)
Spoke to pt at length and she stated she came in here due to an overwhelming feeling of owing a lot of money on her bills. Pt does have a job with [employer] and has a positive support system of her son and sister. Pt has intermittent eye contact and was found lying in the bed. Encouraged pt to get up, make her bed and take a shower. Pt did get up and was going to shower and change clothes. She stated, I just feel blah and do not want to do anything." Pt was also encouraged to go to group after lunch. At this time she is cooperative and did get out of bed. Denies any SI or HI and contracts for safety. Q15 minute checks will continue. Pt has been going to groups but appears very apathetic and states nothing really seems to be working. She did shower and changed clothes as well. Spoke with pts sister, Ms. Delton See -Sister stated that pt has had to hire a lawyer for fear of losing her house. Sister states someone was suing the pt for something and sister is unsure for what. Sister does not want pt to know she told the nurse this. Sister did state that the pt would always have a place to come to should she lose her house. Spoke with pt at length and she was very guarded. All the pt would say is that she is bad with finances . She denied any legal issues but would not look at the nurse when talking.Discussed with pt options about having someone help her with her finances. Pt stated she has owned her house since 1998 and has worked at the same job times 17 years. Pt did state she owes money to people but would not elaborate. Sister also stated that this is close to the anniversary of their mothers death and during this time of year the pt does get sad. Pt also stated her dad has a lot on him as her stepmom recently had a stroke and he cares for her.Both are in there 80's. Pt has been in the dayroom with the other pts but keeps to herself and does not appear to engage in conversation.Pts sister came for visiting hours. Pt  appears even more withdrawn now and has a very sad look on her face.

## 2012-06-30 NOTE — Progress Notes (Signed)
Patient ID: Yvonne [name], female   DOB: 04-12-1966, 46 y.o.   MRN: [id number] D: Patient in room on approach. Pt presented with depressed mood and flat affect.  Pt continues to be isolative in room. Pt turned head away when talking to Clinical research associate.  When asked to go to group. Pt stated "I don't do that".  Pt placed blanket over head. Pt did not attend evening group.  Denies SI/HI/AV and pain. Cooperative with assessment. No acute distressed noted.   A: Met with pt 1:1. Encourage pt to attend groups and take medications.  Pt encouraged to come to staff with any question or concerns. Safety has been maintained with Q15 minutes observation.   R: Patient remains safe. Safety has been maintained Q15 and continue current POC.

## 2012-06-30 NOTE — Progress Notes (Signed)
Psychoeducational Group Note  Date:  06/30/2012 Time:  1000  Group Topic/Focus:  Wellness Toolbox:   The focus of this group is to discuss various aspects of wellness, balancing those aspects and exploring ways to increase the ability to experience wellness.  Patients will create a wellness toolbox for use upon discharge.  Participation Level:  Active  Participation Quality:  Appropriate, Sharing and Supportive  Affect:  Appropriate  Cognitive:  Appropriate  Insight:  Supportive  Engagement in Group:  Supportive  Additional Comments:  none  Demetri Goshert M 06/30/2012, 2:02 PM  

## 2012-06-30 NOTE — Progress Notes (Signed)
Patient ID: Yvonne [name], female   DOB: 1966/02/16, 46 y.o.   MRN: [id number] D: Patient in room sleeping on approach. Pt presented with depressed mood and flat affect. Pt is isolative in room. Pt will not interact with peers and staff. Pt wanted to be let alone in room. Pt had sheet covered over head. Pt refused scheduled medication. Pt did not attend evening karaoke.   Denies SI/HI/AV and pain. Cooperative with assessment. No acute distressed noted.   A: Met with pt 1:1. Pt encouraged to come to staff with any question or concerns. Safety has been maintained with Q15 minutes observation.   R: Patient remains safe. She is complaint with medications and group programming. Safety has been maintained Q15 and continue current POC.

## 2012-06-30 NOTE — Progress Notes (Signed)
Yvonne [name] is observed laying in her bed, in a fetal position, with the bedcovers pulled up over her head. When this nurse approaches her, calls her name, she does not respond and stays as she is. After much encouragement, she reluctantly pulls covers off her head and stares straight ahead, avoiding eye contact and looking down at the floor.She does come to the med window, takes her meds as ordered. This nurse asks her to complete her AM self inventory and  She replied " I aint got  nothin to say.. It won't do no good..."     A Her affect is flat, blank and disconnected. She is unkempt, has sleep crust in her eyes, has a dull blank appearance. and speaks very softly.   R Pt does verbally contract for safety and then return s to her room. POC cont.

## 2012-07-01 NOTE — Progress Notes (Signed)
Yvonne [name] remains status quo.Marland KitchenMarland KitchenMarland KitchenMarland Kitchenisolated from everybody; both patients and the staff. SHe spends ALL of her time....sleeping in her bed...in her room...with covers pulled up over her head... She speaks, but it is  Difficult to hear her words because her voice is so low. She looks away.Marland Kitchenavoiding eye contqct. She is encouraged by this writer to complete her AM self inventory but choose not to...stating " I ain't got nothin to say".   A She is acutely depressed and is not engaged in her recovery as evidenced by her behavior.   R POC cont.

## 2012-07-01 NOTE — Progress Notes (Signed)
Psychoeducational Group Note  Date:  07/01/2012 Time:  2000  Group Topic/Focus:  Wrap-Up Group:   The focus of this group is to help patients review their daily goal of treatment and discuss progress on daily workbooks.  Participation Level: Did Not Attend  Participation Quality:  Not Applicable  Affect:  Not Applicable  Cognitive:  Not Applicable  Insight:  Not Applicable  Engagement in Group: Not Applicable  Additional Comments: The patient slept in her bed rather than choosing to attend group. This author encouraged the patient to attend, but she refused and rolled back over in bed.   Hazle Coca S 07/01/2012, 11:54 PM

## 2012-07-01 NOTE — Progress Notes (Signed)
[facility] MD Progress Note  07/01/2012 3:19 PM Yvonne [name]  MRN:  [id number] Subjective: Took a shower got dressed poor attendance at group.Sleep was poor as checks wake her. Admitted due to being overwhelmed by her financial situation. Denies SI/HI denies side effects to  Zoloft.  Diagnosis:   Axis I: Anxiety Disorder NOS and Depressive Disorder NOS Axis II: Deferred Axis III:  Past Medical History  Diagnosis Date  . No pertinent past medical history    Axis IV: economic problems, occupational problems and other psychosocial or environmental problems Axis V: 41-50 serious symptoms  ADL's:  Impaired  Sleep: Fair  Appetite:  Fair  Psychiatric Specialty Exam: Review of Systems  Constitutional: Negative.   HENT: Negative.   Eyes: Negative.   Respiratory: Negative.   Cardiovascular: Negative.   Gastrointestinal: Negative.   Genitourinary: Negative.   Musculoskeletal: Negative.   Skin: Negative.   Neurological: Negative.   Endo/Heme/Allergies: Negative.   Psychiatric/Behavioral: Positive for depression. The patient is nervous/anxious.     Blood pressure 105/78, pulse 105, temperature 97.5 F (36.4 C), temperature source Oral, resp. rate 16, height 5\' 2"  (1.575 m), weight 63.504 kg (140 lb).Body mass index is 25.61 kg/(m^2).  General Appearance: Disheveled  Eye Contact::  Minimal  Speech:  Slow, mumbling  Volume:  Decreased  Mood:  Dysphoric and Hopeless  Affect:  Constricted and Flat  Thought Process:  Tangential  Orientation:  Full (Time, Place, and Person)  Thought Content:  Rumination  Suicidal Thoughts:  No  Homicidal Thoughts:  No  Memory:  Immediate;   Fair Recent;   Fair Remote;   Fair  Judgement:  Impaired  Insight:  Present  Psychomotor Activity:  Decreased  Concentration:  Fair  Recall:  Fair  Akathisia:  No  Handed:  Right  AIMS (if indicated):     Assets:  Housing Social Support  Sleep:  Number of Hours: 6.75    Current Medications: Current  Facility-Administered Medications  Medication Dose Route Frequency Provider Last Rate Last Dose  . acetaminophen (TYLENOL) tablet 650 mg  650 mg Oral Q6H PRN [name], PA      . alum & mag hydroxide-simeth (MAALOX/MYLANTA) 200-200-20 MG/5ML suspension 30 mL  30 mL Oral Q4H PRN [name], PA      . feeding supplement (ENSURE COMPLETE) liquid 237 mL  237 mL Oral BID BM Lavena Bullion, RD      . LORazepam (ATIVAN) tablet 0.5 mg  0.5 mg Oral BID [name], MD   0.5 mg at 07/01/12 0848  . LORazepam (ATIVAN) tablet 1 mg  1 mg Oral BID PRN [name], MD   1 mg at 06/26/12 1308  . magnesium hydroxide (MILK OF MAGNESIA) suspension 30 mL  30 mL Oral Daily PRN [name], PA      . sertraline (ZOLOFT) tablet 75 mg  75 mg Oral Daily [name], MD   75 mg at 07/01/12 0848  . traZODone (DESYREL) tablet 50 mg  50 mg Oral QHS,MR X 1 [name], PA        Lab Results: No results found for this or any previous visit (from the past 48 hour(s)).  Physical Findings: AIMS: Facial and Oral Movements Muscles of Facial Expression: None, normal Lips and Perioral Area: None, normal Jaw: None, normal Tongue: None, normal,Extremity Movements Upper (arms, wrists, hands, fingers): None, normal Lower (legs, knees, ankles, toes): None, normal, Trunk Movements Neck, shoulders, hips: None, normal, Overall Severity Severity  of abnormal movements (highest score from questions above): None, normal Incapacitation due to abnormal movements: None, normal Patient's awareness of abnormal movements (rate only patient's report): No Awareness, Dental Status Current problems with teeth and/or dentures?: No Does patient usually wear dentures?: No  CIWA:    COWS:     Treatment Plan Summary: Daily contact with patient to assess and evaluate symptoms and progress in treatment Medication management  Plan: Increase Zoloft to 75mg  po qd. Encourage compliance with medications and to attend  groups.  Medical Decision Making Problem Points:  Established problem, stable/improving (1), Review of last therapy session (1) and Review of psycho-social stressors (1) Data Points:  Review of medication regiment & side effects (2) Review of new medications or change in dosage (2)  I certify that inpatient services furnished can reasonably be expected to improve the patient's condition.   Mekenzie Modeste,MICKIE D.PA-C CAQ-Psych 07/01/2012, 3:19 PM

## 2012-07-01 NOTE — Clinical Social Work Note (Signed)
[facility] Group Notes:  (Clinical Social Work)  07/01/2012   3:00-4:00PM  Summary of Progress/Problems:   The main focus of today's process group was for the patient to identify ways in which they have in the past sabotaged their own recovery and reasons they may have done this/what they received from doing it.  We then worked to identify a specific plan to avoid doing this when discharged from the hospital for this admission.  The patient expressed only that she "passed" and would not talk during group.  She did stay throughout and listened.  Type of Therapy:  Group Therapy - Process  Participation Level:  Minimal  Participation Quality:  Attentive  Affect:  Defensive and Depressed  Cognitive:  Oriented  Insight:  Limited  Engagement in Therapy:  Limited  Modes of Intervention:  Clarification, Education, Limit-setting, Problem-solving, Socialization, Support and Processing, Exploration, Discussion   Ambrose Mantle, LCSW 07/01/2012, 4:18 PM

## 2012-07-01 NOTE — Progress Notes (Signed)
Group Topic/Focus:  Identifying Needs:   The focus of this group is to help patients identify their personal needs that have been historically problematic and identify healthy behaviors to address their needs.   Did not attend  Dione Housekeeper

## 2012-07-01 NOTE — Progress Notes (Signed)
Pt's condition remains unchanged. She continues to rest in bed with the covers over her head. Very little engagement in conversation with this Clinical research associate. Denies any needs and refuses trazadone for sleep. Unwilling to come out of room into the milieu. Support and encouragement offered with minimal receptivity. Will continue to monitor closely and encourage participation. She denies SI/HI/AVH and is safe.Lawrence Marseilles

## 2012-07-01 NOTE — Progress Notes (Signed)
Goals Group This is a group that identifies the program and helps them to start working on their packets for the day.  Each person sets a goal for the day that is measurable Pt did not attend 

## 2012-07-02 NOTE — Progress Notes (Signed)
Reviewed the note and agree with the treatment plan...Khai Arrona, MD  

## 2012-07-02 NOTE — Progress Notes (Signed)
Patient ID: Yvonne [name], female   DOB: 20-Nov-1965, 46 y.o.   MRN: [id number] Problem: Major Depressive Disorder  D: Patient withdrawn and isolative to her room, not interacting in milieu. A: Monitor patient Q 15 minutes for safety, encourage staff/peer interaction and group participation. Administer medications as ordered by MD. R: Patient not vested in treatment and refusing group and medications.

## 2012-07-02 NOTE — Progress Notes (Addendum)
D Pt is seen .in her bed..with the covers pulled over her head. SHe did not attend either group this AM, staying in her bed to sleep. SHe stayed back for lunch, sleeping in her bed and when her sister visited she said very little, her sister became alarmed ( at her lack of progress and that she had been in bed all day and not taken her meds.)   A pt refuses to make eye contact.she did take her AM meds after lunch , but did not eat h er lunch. She allowed this nurse to change her bed linens.   R Safety is in place and POC cont.

## 2012-07-02 NOTE — Progress Notes (Signed)
Psychoeducational Group Note  Date:  07/02/2012 Time:  2000  Group Topic/Focus:  Wrap-Up Group:   The focus of this group is to help patients review their daily goal of treatment and discuss progress on daily workbooks.  Participation Level: Did Not Attend  Participation Quality:  Not Applicable  Affect:  Not Applicable  Cognitive:  Not Applicable  Insight:  Not Applicable  Engagement in Group: Not Applicable  Additional Comments: The patient did not attend group despite being encouraged by this author.   Hazle Coca S 07/02/2012, 11:40 PM

## 2012-07-02 NOTE — Progress Notes (Signed)
[facility] MD Progress Note  07/02/2012 10:55 AM Yvonne [name]  MRN:  [id number] Subjective:  Seen in room in bed- has yesterday's clothes on. Not attending group refusing meds. Will ask SW to review with her that she needs to participate. This is not a hotel. She says her 47 yo son is at home.   Diagnosis:   Axis I: Anxiety Disorder NOS and Depressive Disorder NOS Axis II: Deferred Axis III:  Past Medical History  Diagnosis Date  . No pertinent past medical history    Axis IV: economic problems, occupational problems and other psychosocial or environmental problems Axis V: 41-50 serious symptoms  ADL's:  Impaired  Sleep: Fair  Appetite:  Fair  Psychiatric Specialty Exam: Review of Systems  Constitutional: Negative.   HENT: Negative.   Eyes: Negative.   Respiratory: Negative.   Cardiovascular: Negative.   Gastrointestinal: Negative.   Genitourinary: Negative.   Musculoskeletal: Negative.   Skin: Negative.   Neurological: Negative.   Endo/Heme/Allergies: Negative.   Psychiatric/Behavioral: Positive for depression. The patient is nervous/anxious.     Blood pressure 105/78, pulse 105, temperature 97.5 F (36.4 C), temperature source Oral, resp. rate 16, height 5\' 2"  (1.575 m), weight 63.504 kg (140 lb).Body mass index is 25.61 kg/(m^2).  General Appearance: Disheveled  Eye Contact::  Minimal  Speech:  Slow, mumbling  Volume:  Decreased  Mood:  Dysphoric and Hopeless  Affect:  Constricted and Flat  Thought Process:  Tangential  Orientation:  Full (Time, Place, and Person)  Thought Content:  Rumination  Suicidal Thoughts:  No  Homicidal Thoughts:  No  Memory:  Immediate;   Fair Recent;   Fair Remote;   Fair  Judgement:  Impaired  Insight:  Present  Psychomotor Activity:  Decreased  Concentration:  Fair  Recall:  Fair  Akathisia:  No  Handed:  Right  AIMS (if indicated):     Assets:  Housing Social Support  Sleep:  Number of Hours: 6.75    Current  Medications: Current Facility-Administered Medications  Medication Dose Route Frequency Provider Last Rate Last Dose  . acetaminophen (TYLENOL) tablet 650 mg  650 mg Oral Q6H PRN [name], PA      . alum & mag hydroxide-simeth (MAALOX/MYLANTA) 200-200-20 MG/5ML suspension 30 mL  30 mL Oral Q4H PRN [name], PA      . feeding supplement (ENSURE COMPLETE) liquid 237 mL  237 mL Oral BID BM Lavena Bullion, RD      . LORazepam (ATIVAN) tablet 0.5 mg  0.5 mg Oral BID [name], MD   0.5 mg at 07/01/12 0848  . LORazepam (ATIVAN) tablet 1 mg  1 mg Oral BID PRN [name], MD   1 mg at 06/26/12 1308  . magnesium hydroxide (MILK OF MAGNESIA) suspension 30 mL  30 mL Oral Daily PRN [name], PA      . sertraline (ZOLOFT) tablet 75 mg  75 mg Oral Daily [name], MD   75 mg at 07/01/12 0848  . traZODone (DESYREL) tablet 50 mg  50 mg Oral QHS,MR X 1 [name], PA        Lab Results: No results found for this or any previous visit (from the past 48 hour(s)).  Physical Findings: AIMS: Facial and Oral Movements Muscles of Facial Expression: None, normal Lips and Perioral Area: None, normal Jaw: None, normal Tongue: None, normal,Extremity Movements Upper (arms, wrists, hands, fingers): None, normal Lower (legs, knees, ankles, toes): None, normal,  Trunk Movements Neck, shoulders, hips: None, normal, Overall Severity Severity of abnormal movements (highest score from questions above): None, normal Incapacitation due to abnormal movements: None, normal Patient's awareness of abnormal movements (rate only patient's report): No Awareness, Dental Status Current problems with teeth and/or dentures?: No Does patient usually wear dentures?: No  CIWA:    COWS:     Treatment Plan Summary: Daily contact with patient to assess and evaluate symptoms and progress in treatment Medication management  Plan: Increase Zoloft to 75mg  po qd. Encourage compliance with  medications and to attend groups. SW needs to get some family members observations of patient's behavior.   Medical Decision Making Problem Points:  Established problem, stable/improving (1), Review of last therapy session (1) and Review of psycho-social stressors (1) Data Points:  Review of medication regiment & side effects (2) Review of new medications or change in dosage (2)  I certify that inpatient services furnished can reasonably be expected to improve the patient's condition.   [name],MICKIE D.PA-C CAQ-Psych 07/02/2012, 10:55 AM

## 2012-07-02 NOTE — Clinical Social Work Note (Signed)
[facility] Group Notes: (Clinical Social Work)   07/02/2012      Type of Therapy:  Group Therapy   Participation Level:  Did Not Attend    Ambrose Mantle, LCSW 07/02/2012, 4:42 PM

## 2012-07-02 NOTE — Progress Notes (Signed)
Psychoeducational Group Note  Date:  07/02/2012 Time:  1015  Group Topic/Focus:  Making Healthy Choices:   The focus of this group is to help patients identify negative/unhealthy choices they were using prior to admission and identify positive/healthier coping strategies to replace them upon discharge.  Participation Level:  Did Not Attend   Dione Housekeeper 07/02/2012

## 2012-07-02 NOTE — Progress Notes (Signed)
Psychoeducational Group Note  Date:  07/02/2012 Time:  0115  Group Topic/Focus:  Identifying Needs:   The focus of this group is to help patients identify their personal needs that have been historically problematic and identify healthy behaviors to address their needs.  Participation Level:  Did Not Attend  Participation Quality:    Affect:    Cognitive:    Insight:    Engagement in Group:    Additional Comments:  Patient remained in bed awake for the duration of group this afternoon.  Barbara Keng, Newton Pigg 07/02/2012, 2:45 PM

## 2012-07-03 MED ORDER — SERTRALINE HCL 25 MG PO TABS: 75.0000 mg | ORAL_TABLET | Freq: Every day | ORAL | Status: DC

## 2012-07-03 MED ORDER — TRAZODONE HCL 50 MG PO TABS: 50.0000 mg | ORAL_TABLET | Freq: Every evening | ORAL | Status: DC | PRN

## 2012-07-03 MED ORDER — LORAZEPAM 0.5 MG PO TABS: 0.5000 mg | ORAL_TABLET | Freq: Two times a day (BID) | ORAL | Status: DC

## 2012-07-03 NOTE — Progress Notes (Signed)
[facility] LCSW Group Therapy  07/03/2012 4:29 PM  Type of Therapy:  Group Therapy  Participation Level:  Did Not Attend  Aris Georgia 07/03/2012, 4:29 PM

## 2012-07-03 NOTE — Discharge Summary (Signed)
Physician Discharge Summary Note  Patient:  Yvonne [name] is an 46 y.o., female MRN:  [id number] DOB:  01/27/1966 Patient phone:  [phone] (home)  Patient address:   [address] [place] [address],   Date of Admission:  06/20/2012 Date of Discharge: 07/03/2012  Reason for Admission:  Depression of 7/10, decline in everyday functioning  Discharge Diagnoses: Principal Problem:  *Major depressive disorder  Review of Systems  Constitutional: Negative.   HENT: Negative.   Eyes: Negative.   Respiratory: Negative.   Cardiovascular: Negative.   Gastrointestinal: Negative.   Genitourinary: Negative.   Musculoskeletal: Negative.   Skin: Negative.   Neurological: Negative.   Endo/Heme/Allergies: Negative.   Psychiatric/Behavioral: The patient is nervous/anxious.    Axis Diagnosis:   AXIS I:  Depressive Disorder NOS AXIS II:  Deferred AXIS III:   Past Medical History  Diagnosis Date  . No pertinent past medical history    AXIS IV:  economic problems, housing problems, occupational problems, other psychosocial or environmental problems, problems related to social environment and problems with primary support group AXIS V:  61-70 mild symptoms  Level of Care:  IOP  Hospital Course:  Individual and group therapy, antidepressant started--zoloft, ativan for anxiety, and trazodone for sleep during inpatient.  Patient denies suicidal/homicidal ideations and hallucinations.  She has developed appropriate coping skills for depression and anxiety.  Yvonne [name] is stable for discharge and Rx given.  Consults:  None  Significant Diagnostic Studies:  labs: Completed and reviewed, stable  Discharge Vitals:   Blood pressure 111/74, pulse 121, temperature 98 F (36.7 C), temperature source Oral, resp. rate 18, height 5\' 2"  (1.575 m), weight 63.504 kg (140 lb). Body mass index is 25.61 kg/(m^2). Lab Results:   No results found for this or any previous visit (from the past 72  hour(s)).  Physical Findings: AIMS: Facial and Oral Movements Muscles of Facial Expression: None, normal Lips and Perioral Area: None, normal Jaw: None, normal Tongue: None, normal,Extremity Movements Upper (arms, wrists, hands, fingers): None, normal Lower (legs, knees, ankles, toes): None, normal, Trunk Movements Neck, shoulders, hips: None, normal, Overall Severity Severity of abnormal movements (highest score from questions above): None, normal Incapacitation due to abnormal movements: None, normal Patient's awareness of abnormal movements (rate only patient's report): No Awareness, Dental Status Current problems with teeth and/or dentures?: No Does patient usually wear dentures?: No  CIWA:    COWS:     Psychiatric Specialty Exam: See Psychiatric Specialty Exam and Suicide Risk Assessment completed by Attending Physician prior to discharge.  Discharge destination:  Home  Is patient on multiple antipsychotic therapies at discharge:  No   Has Patient had three or more failed trials of antipsychotic monotherapy by history:  No Recommended Plan for Multiple Antipsychotic Therapies:  N/A  Discharge Orders    Future Orders Please Complete By Expires   Diet - low sodium heart healthy      Activity as tolerated - No restrictions          Medication List     As of 07/03/2012  9:52 AM    TAKE these medications      Indication    LORazepam 0.5 MG tablet   Commonly known as: ATIVAN   Take 1 tablet (0.5 mg total) by mouth 2 (two) times daily.    Indication: Feeling Anxious      sertraline 25 MG tablet   Commonly known as: ZOLOFT   Take 3 tablets (75 mg total) by mouth daily.  Indication: Major Depressive Disorder      traZODone 50 MG tablet   Commonly known as: DESYREL   Take 1 tablet (50 mg total) by mouth at bedtime and may repeat dose one time if needed.    Indication: Trouble Sleeping           Follow-up Information    Follow up with Dr. Lolly Mustache - [facility]  Outpatient Clinic. On 07/11/2012. (You are scheduled to see Dr. Lolly Mustache on Tuesday, July 11, 2012 at 1:30 PM.  Please arrive for appointment at [facility])    Contact information:   [address] [place], [place]   [address]  [phone]         Follow-up recommendations:  Activity as tolerated, low-sodium heart healthy diet  Comments:  Follow-up with Dr. Lolly Mustache in IOP after discharge  Total Discharge Time:  Greater than 30 minutes  Signed: [name], PMH-NP 07/03/2012, 9:52 AM

## 2012-07-03 NOTE — Progress Notes (Signed)
Patient discharge home with sister.  Discharge instructions, follow up information, prescriptions given to patient who verbalized understanding.  FMLA paperwork given to patient per [facility] Nail, LCSW.  Personal belongings returned to patient.

## 2012-07-03 NOTE — Progress Notes (Signed)
Reviewed patient's FMLA paperwork and met with patient sister who came to pick her up. Answered all questions and had NP revise FMLA paperwork and completed not for work for patient. Patient to follow up with outpatient with [facility]. Information for FMLA will be faxed to [employer] once call returned to verify fax number. Called CM for disability and will follow up as she has left for the day.  Have extra copies of paperwork in hand for patient and gave patient additional copy. No other needs reported.  Patient DC home with sister for the night, as son will work all night at his job. Suicide prevention was completed with sister, as calls were made to connect and educate, but unable to reach. All information reviewed and given.  DC home.  [name] Jacobs, MSW LCSW [phone]

## 2012-07-03 NOTE — [facility] Suicide Risk Assessment (Signed)
Suicide Risk Assessment  Discharge Assessment     Demographic Factors:  Female, african [place], single  Mental Status Per Nursing Assessment::   On Admission:  NA  Current Mental Status by Physician: Patient is alert and oriented to 4. Denies AH/VH/SI/HI.  Loss Factors: Financial problems/change in socioeconomic status  Historical Factors: Impulsivity  Risk Reduction Factors:   Living with another person, especially a relative and Positive social support  Continued Clinical Symptoms:  Depression:   Recent sense of peace/wellbeing  Cognitive Features That Contribute To Risk:  Thought constriction (tunnel vision)    Suicide Risk:  Minimal: No identifiable suicidal ideation.  Patients presenting with no risk factors but with morbid ruminations; may be classified as minimal risk based on the severity of the depressive symptoms  Discharge Diagnoses:   AXIS I:  Anxiety Disorder NOS and Depressive Disorder NOS AXIS II:  Deferred AXIS III:   Past Medical History  Diagnosis Date  . No pertinent past medical history    AXIS IV:  economic problems AXIS V:  61-70 mild symptoms  Plan Of Care/Follow-up recommendations:  Activity:  normal Diet:  normal Follow up with outpatient appointments.  Is patient on multiple antipsychotic therapies at discharge:  No   Has Patient had three or more failed trials of antipsychotic monotherapy by history:  No  Recommended Plan for Multiple Antipsychotic Therapies: NA  Duke Weisensel 07/03/2012, 9:55 AM

## 2012-07-03 NOTE — Progress Notes (Signed)
[facility] INPATIENT:  Family/Significant Other Suicide Prevention Education  Suicide Prevention Education:  Contact Attempts: Birdie Riddle [phone],  has been identified by the patient as the family member/significant other with whom the patient will be residing, and identified as the person(s) who will aid the patient in the event of a mental health crisis.  With written consent from the patient, two attempts were made to provide suicide prevention education, prior to and/or following the patient's discharge.  We were unsuccessful in providing suicide prevention education.  A suicide education pamphlet was given to the patient to share with family/significant other.  Date and time of first attempt: 07/03/2012 Patient did not endorse SI throughout admission nor upon admission.  Attempted to make contact with sister as a curtsy, but failed to reach.  Patient to dc 07/03/2012  Nail, Catalina Gravel 07/03/2012, 10:20 AM

## 2012-07-03 NOTE — Progress Notes (Signed)
[facility] LCSW Aftercare Discharge Planning Group Note  07/03/2012 10:08 AM  Participation Quality:  Appropriate and Attentive  Affect:  Anxious, Blunted, Depressed, Flat and sad  Cognitive:  Alert and Appropriate  Insight:  Limited  Engagement in Group:  Limited  Modes of Intervention:  Discussion, Exploration and Problem-solving  Summary of Progress/Problems: Patient attended morning group and received daily packet.  Patient is very quiet and appears sad. She reports no anxiety or depression today, but her behaviors are not congruent. She reports she is ready to go home and she follows up at the outpatient [facility] here at the hospital. Will verify appointment and follow up. Patient planned to dc today.  Nail, Catalina Gravel 07/03/2012, 10:08 AM

## 2012-07-03 NOTE — Progress Notes (Signed)
Psychoeducational Group Note  Date:  07/03/2012 Time:  1100  Group Topic/Focus:  Self Care:   The focus of this group is to help patients understand the importance of self-care in order to improve or restore emotional, physical, spiritual, interpersonal, and financial health.  Participation Level:  Minimal  Participation Quality:  Appropriate and Attentive  Affect:  Appropriate  Cognitive:  Appropriate  Insight:  Engaged  Engagement in Group:  Lacking  Additional Comments:  Pt. Did not participate in self-care assessment.   Ruta Hinds [place] 07/03/2012, 3:06 PM

## 2012-07-04 NOTE — Progress Notes (Signed)
Patient Discharge Instructions:  Next Level Care Provider Has Access to the EMR, 07/04/12 Records provided to Dr. Lolly Mustache [facility] via [facility].  Jerelene Redden, 07/04/2012, 11:39 AM

## 2012-07-06 NOTE — Discharge Summary (Signed)
Reviewed

## 2012-07-11 ENCOUNTER — Ambulatory Visit (INDEPENDENT_AMBULATORY_CARE_PROVIDER_SITE_OTHER): Payer: 59 | Admitting: Psychiatry

## 2012-07-11 ENCOUNTER — Encounter (HOSPITAL_COMMUNITY): Payer: Self-pay | Admitting: Psychiatry

## 2012-07-11 VITALS — BP 111/81 | HR 82 | Wt 135.4 lb

## 2012-07-11 DIAGNOSIS — F329 Major depressive disorder, single episode, unspecified: Secondary | ICD-10-CM

## 2012-07-11 MED ORDER — SERTRALINE HCL 25 MG PO TABS: 100.0000 mg | ORAL_TABLET | Freq: Every day | ORAL | Status: DC

## 2012-07-11 MED ORDER — SERTRALINE HCL 100 MG PO TABS: 100.0000 mg | ORAL_TABLET | Freq: Every day | ORAL | Status: DC

## 2012-07-11 MED ORDER — LORAZEPAM 0.5 MG PO TABS: 0.5000 mg | ORAL_TABLET | Freq: Two times a day (BID) | ORAL | Status: DC

## 2012-07-11 MED ORDER — TRAZODONE HCL 50 MG PO TABS: 50.0000 mg | ORAL_TABLET | Freq: Every evening | ORAL | Status: DC | PRN

## 2012-07-11 NOTE — Progress Notes (Addendum)
Patient ID: Yvonne [name], female   DOB: 03/10/1966, 46 y.o.   MRN: [id number] Chief complaint I was admitted in the hospital for depression.  History of presenting illness Patient is 46 year old [place] American female who came for her appointment for continuity of care.  [facility] was [facility] on [facility].  She was admitted after feeling overwhelmed and excessive financial distress.  She was behind bills and payments.  She was unable to function with feeling of hopelessness helplessness and having crying spells.  She felt that her body shutdown.  She could not concentrate, not eating well and sleeping well.  At behavioral Center patient was started on Zoloft, Ativan and trazodone.  Patient shown some improvement with the medication.  Today she came with her sister.  Patient discharged from hospital to her sister as she continued to have residual symptoms of anxiety and depression.  Patient reports her sleep is better but she still has poor attention, poor concentration crying spells anhedonia.  She still feel some time very hopeless and helpless but denies any active or passive suicidal thoughts.  Patient is a poor historian and did not provide much information.  She maintained very poor eye contact and sometime noticed mumbling.  She admitted that she has done mistakes in her life and she regrets.  She admits spending a lot of money on shopping, going to beach trip and behind bills.  She has been not work since December 9.  She's on a FMLA since then.  Patient admitted some improvement in her depression with the medication.  She sleeping 6 hours and her appetite is somewhat increased.  Her sister endorse patient is still has some time excessive anxiety and thought blocking.  She feel very nervous and sometime does not know what to do.  Her attention and concentration remains poor.  She denies any aggression and violence agitation hallucination or any mania.  She denies any  recent panic attack.  She's taking Zoloft, Ativan and trazodone.  She denies any side effects of medication.  She endorse decreased energy and excessive worrying about the future.  She also endorse racing thoughts and confusion about her life.  Patient did not express specific stressor other than finances that causing issues in her life.  She's not drinking or using any illegal substance.  Past psychiatric history Patient denies any previous history of suicidal attempt, psychosis, mania or any hallucination.  She was admitted recently at behavioral Center due to significant depression feeling overwhelmed and unable to function.  Family history Patient endorse sister has depression and brother has mental illness.  Patient's son has bipolar disorder.  Psychosocial history Patient is born and raised in [place].  Patient denies any history of physical sexual verbal or emotional abuse.  She has one son who is 55 year old who has bipolar disorder.  Patient usually lives with her son but recently she's been living with her sister.  Military history Patient has been in [employer] for one year.  She was honorable discharge.  Education work history Patient has high school education.  She's working at [employer] as [employer].  Currently she is on FMLA.  Medical history Patient does not have any active medical problems.  Her labs are fine why she was admitted in the hospital.  Alcohol and substance use history Patient denies any history of alcohol or any substance use.  Review of Systems  Skin: Negative for rash.  Psychiatric/Behavioral: Positive for depression and memory loss.  Negative for suicidal ideas, hallucinations and substance abuse. The patient has insomnia.        Poor attention and poor concentration   Mental status examination Patient is casually dressed and fairly groomed.  She maintained poor eye contact.  She is somewhat guarded and difficult to engage in  conversation.  Her speech is slow at times mumbling with decreased volume and tone.  She has non-spontaneous speech.  She described her mood is sad and depressed and her affect is flat and constricted.  She has psychomotor retardation.  There were no tremors or shakes present.  Her thought processes slow but logical.  Her attention and concentration is poor.  She is easily distracted.  She has at times thought blocking and difficult to recollect and organize her thoughts.  Her fund of knowledge is fair.  She has a lot of negative thoughts about herself however she denies any active or passive suicidal thoughts or homicidal thoughts.  There were no delusion or obsession present.  She denies any auditory or visual hallucination.  There were no tremors or shakes present.  She appears to be in his stated age.  She's alert and oriented x3 but she could not do serial 7.  Her insight judgment and pulse control is okay.  Assessment Axis I Maj. depressive disorder  Axis II deferred Axis III no active medical problems Axis IV mild to moderate Axis V 50-55  Plan Patient is still has moderate symptoms of depression and anxiety.  She has a lot of negative symptoms.  At this time patient is taking Zoloft 75 mg along with Ativan 0.5 mg twice a day and trazodone 50 mg at bedtime.  Patient is unable to go back to work due to excessive anxiety and depressive symptoms.  I recommend to increase Zoloft 100 mg daily.  I explain in detail the risk and benefits of medication.  I also recommend to see therapist for coping and social skills.  We talk about safety plan that anytime having any active suicidal thoughts or homicidal thoughts and she need to call 911 or go to local emergency room.  Plan was also explained to her sister who was present during the session.  I recommend to contact her employer to extend her FMLA until she will be seen in 2-3 weeks.  Time spent 60 minutes.  Followup in 2-3 weeks.  Portion of this note is  generated with voice dictation software and may contain typographical error.

## 2012-07-13 ENCOUNTER — Telehealth (HOSPITAL_COMMUNITY): Payer: Self-pay

## 2012-07-13 ENCOUNTER — Telehealth (HOSPITAL_COMMUNITY): Payer: Self-pay | Admitting: *Deleted

## 2012-07-13 ENCOUNTER — Encounter (HOSPITAL_COMMUNITY): Payer: Self-pay | Admitting: *Deleted

## 2012-07-13 NOTE — Telephone Encounter (Signed)
1:46pm 07/13/12 Called patient's sister  informed that paperwork is ready for pick-up./sh

## 2012-07-13 NOTE — Telephone Encounter (Signed)
Sister called.  Patient needs a letter for work stating why she cannot return at this time. Sister states it was discussed during appt and thought it was in papers given after appt. Sister states pt needs to give letter to employer today. Sister also brought form to office that needs to be filled out for pt's employer.

## 2012-07-17 ENCOUNTER — Telehealth (HOSPITAL_COMMUNITY): Payer: Self-pay | Admitting: *Deleted

## 2012-07-17 NOTE — Telephone Encounter (Signed)
Sister called on pt's behalf. Requested office contact Eliezer Mccoy at [employer]. Contacted Ms.Pressilio at [phone].(ROI on file) Left  information on VM at [employer] for return call

## 2012-07-18 ENCOUNTER — Encounter (HOSPITAL_COMMUNITY): Payer: Self-pay | Admitting: Psychiatry

## 2012-07-18 ENCOUNTER — Ambulatory Visit (INDEPENDENT_AMBULATORY_CARE_PROVIDER_SITE_OTHER): Payer: 59 | Admitting: Psychiatry

## 2012-07-18 ENCOUNTER — Other Ambulatory Visit (HOSPITAL_COMMUNITY): Payer: Self-pay | Admitting: Psychiatry

## 2012-07-18 DIAGNOSIS — F329 Major depressive disorder, single episode, unspecified: Secondary | ICD-10-CM

## 2012-07-18 NOTE — Progress Notes (Signed)
Patient ID: Yvonne [name], female   DOB: 1966-02-22, 47 y.o.   MRN: [id number] Presenting Problem Chief Complaint: depression; financial stress  What are the main stressors in your life right now, how long? Depression, financial stress  Previous mental health services Have you ever been treated for a mental health problem, when, where, by whom? Discharged 12/23 from [facility] inpatient  Are you currently seeing a therapist or counselor, counselor's name? No, first experience seeing therapist  Have you ever had a mental health hospitalization, how many times, length of stay? Yes, discharged from [facility] inpatient on 12/23  Have you ever been treated with medication, name, reason, response? Ativan, Zoloft, Desyril  Have you ever had suicidal thoughts or attempted suicide, when, how? Pt. Denies present or past thoughts of suicide  Risk factors for Suicide Demographic factors:  Divorced or widowed Current mental status: Pt. Denies thoughts or plan to harm self or others Loss factors: Financial problems/change in socioeconomic status Historical factors: Family history of depression Risk Reduction factors: Living with another person, especially a relative Clinical factors:  depression Cognitive features that contribute to risk: Polarized thinking    SUICIDE RISK:  Minimal: No identifiable suicidal ideation.  Patients presenting with no risk factors but with morbid ruminations; may be classified as minimal risk based on the severity of the depressive symptoms  Medical history Medical treatment and/or problems, explain: deferred Do you have any issues with chronic pain?  No Name of primary care physician/last physical exam: deferred  Allergies: NA Medication, reactions? NA   Current medications: Ativan, Zoloft, Desyril Prescribed by: Arfeen Is there any history of mental health problems or substance abuse in your family, whom? 61 year old son diagnosed with depression Has anyone in your family  been hospitalized, who, where, length of stay? NA  Social/family history Have you been married, how many times?  Married once  Do you have children?  31 year old son  How many pregnancies have you had?  NA  Who lives in your current household? Living with sister  Military history: one year in the military  Religious/spiritual involvement:  What religion/faith base are you? Christian  Family of origin (childhood history)  Where were you born? [place] Where did you grow up? [place] How many different homes have you lived? deferred Describe the atmosphere of the household where you grew up: deferred Do you have siblings, step/half siblings, list names, relation, sex, age? 2 older sisters  Are your parents separated/divorced, when and why? No  Are your parents alive? Mother deceased  Social supports (personal and professional): sisters  Education How many grades have you completed? deferred Did you have any problems in school, what type? deferred Medications prescribed for these problems?  deferred  Employment (financial issues) Pt. Reports significant financial stress due to poor money management and relationship problems  Legal history none  Trauma/Abuse history: Have you ever been exposed to any form of abuse, what type? Pt. Denied abuse history  Have you ever been exposed to something traumatic, describe? Pt. Denied trauma history  Substance use Do you use Caffeine? no Type, frequency? NA  Do you use Nicotine? no Type, frequency, ppd? NA  Do you use Alcohol? No Type, frequency? NA  How old were you went you first tasted alcohol? NA Was this accepted by your family? Na  When was your last drink, type, how much? NA  Have you ever used illicit drugs or taken more than prescribed, type, frequency, date of last usage? Na  Mental Status: General Appearance Yvonne [name]:  Casual Eye Contact:  Fair Motor Behavior:  Psychomotor Retardation Speech:   Rate:slow Level of Consciousness:  Alert Mood:  Depressed Affect:  Depressed Anxiety Level:  Minimal Thought Process:  Relevant Thought Content:  WNL Perception:  Normal Judgment:  Fair Insight:  Poor Cognition:  Concentration Yes  Diagnosis AXIS I Major Depression, single episode  AXIS II No diagnosis  AXIS III Past Medical History  Diagnosis Date  . No pertinent past medical history     AXIS IV economic problems and other psychosocial or environmental problems  AXIS V 51-60 moderate symptoms   Plan: Pt to return in one week for continued assessment.  _________________________________________         Jonna Clark, NCC, [facility] 07/18/12

## 2012-07-25 ENCOUNTER — Ambulatory Visit (INDEPENDENT_AMBULATORY_CARE_PROVIDER_SITE_OTHER): Payer: 59 | Admitting: Psychiatry

## 2012-07-25 ENCOUNTER — Encounter (HOSPITAL_COMMUNITY): Payer: Self-pay | Admitting: Psychiatry

## 2012-07-25 DIAGNOSIS — F329 Major depressive disorder, single episode, unspecified: Secondary | ICD-10-CM

## 2012-07-25 NOTE — Progress Notes (Signed)
   THERAPIST PROGRESS NOTE  Session Time: 50 minutes  Participation Level: Active  Behavioral Response: NeatAlertDepressed  Type of Therapy: Individual Therapy  Treatment Goals addressed: Coping  Interventions: CBT and Reframing  Summary: Yvonne [name] is a 47 y.o. female who presents with depression and anxiety.   Suicidal/Homicidal: Nowithout intent/plan  Objective Observations: Pt. Reported anxiety regarding future return to work and ability to address financial obligations.  Subjective Observations and Therapist Response: Pt. Continues to harshly judge self for past financial decisions. Counselor continued to focus on development of rapport, trust, and therapeutic alliance. Counselor introduced [facility] as Counselling psychologist and to encourage present-moment awareness. Counselor introduced treatment planning process, development of counseling goals, i.e., stress management, managing anxiety, challenging polarized thinking, grief related to losses and acceptance of current self.  Plan: Return again in 1 week.  Diagnosis: Axis I: Major Depression, single episode    Axis II: Deferred    Jonna Clark, NCC, [facility] 07/25/2012

## 2012-08-01 ENCOUNTER — Encounter (HOSPITAL_COMMUNITY): Payer: Self-pay | Admitting: Psychiatry

## 2012-08-01 ENCOUNTER — Ambulatory Visit (INDEPENDENT_AMBULATORY_CARE_PROVIDER_SITE_OTHER): Payer: 59 | Admitting: Psychiatry

## 2012-08-01 DIAGNOSIS — F329 Major depressive disorder, single episode, unspecified: Secondary | ICD-10-CM

## 2012-08-01 NOTE — Progress Notes (Signed)
   THERAPIST PROGRESS NOTE  Session Time: 37  Participation Level: Active  Behavioral Response: CasualAlertEuthymic  Type of Therapy: Individual Therapy  Treatment Goals addressed: tendency to ruminate on negative thoughts  Interventions: Other: mindfulness-based cognitive therapy  Summary: Yvonne [name] is a 47 y.o. female who presents with depression.   Suicidal/Homicidal: Nowithout intent/plan  Therapist Response: completion of treatment plan  Plan: Return again in 1 weeks.  Diagnosis: Axis I: Depressive Disorder NOS    Axis II: No diagnosis    Jonna Clark, LPC, [facility] 08/01/2012

## 2012-08-02 ENCOUNTER — Encounter (HOSPITAL_COMMUNITY): Payer: Self-pay | Admitting: *Deleted

## 2012-08-02 ENCOUNTER — Ambulatory Visit (INDEPENDENT_AMBULATORY_CARE_PROVIDER_SITE_OTHER): Payer: 59 | Admitting: Psychiatry

## 2012-08-02 ENCOUNTER — Encounter (HOSPITAL_COMMUNITY): Payer: Self-pay | Admitting: Psychiatry

## 2012-08-02 VITALS — BP 118/75 | HR 92 | Wt 135.6 lb

## 2012-08-02 DIAGNOSIS — F329 Major depressive disorder, single episode, unspecified: Secondary | ICD-10-CM | POA: Insufficient documentation

## 2012-08-02 MED ORDER — LORAZEPAM 0.5 MG PO TABS: 0.5000 mg | ORAL_TABLET | Freq: Two times a day (BID) | ORAL | Status: DC

## 2012-08-02 MED ORDER — SERTRALINE HCL 100 MG PO TABS: 100.0000 mg | ORAL_TABLET | Freq: Every day | ORAL | Status: DC

## 2012-08-02 NOTE — Progress Notes (Signed)
[facility] [id number] Progress Note  Yvonne [name] [id number] 47 y.o.  08/02/2012 3:22 PM  Chief Complaint: I still feel depressed.    History of presenting illness Patient is 47 year old [place] female who came for her followup appointment.  On her last visit we increased her Zoloft to 100 mg.  She sleeping better.  She still has anxiety and depressive thoughts.  She is seeing therapist.  She has few crying spells and suicidal thoughts but no plan.  She still has a lot of negative symptoms and sometime feeling of hopelessness and helplessness.  She's isolated and withdrawn and has limited interaction in her daily activities.  She's not taking trazodone and she sleeping better with increase Zoloft.  She remains anxious and her attention and concentration is still poor.  She denies any panic attack but she still has a lot of worries about the future.  She's out of work however we talk about considering to restart work in few weeks.  She's not drinking or using any illegal substance.  Suicidal Ideation: Yes Plan Formed: No Patient has means to carry out plan: No  Homicidal Ideation: No Plan Formed: No Patient has means to carry out plan: No  Review of Systems: Psychiatric: Agitation: No Hallucination: No Depressed Mood: Yes Insomnia: No Hypersomnia: No Altered Concentration: No Feels Worthless: Yes Grandiose Ideas: No Belief In Special Powers: No New/Increased Substance Abuse: No Compulsions: No  Neurologic: Headache: No Seizure: No Paresthesias: No  Past psychiatric history Patient was admitted to behavioral Center due to significant depression and feeling overwhelmed.  Patient denies any history of suicidal attempt psychosis mania or any hallucination.  Medical history Patient does not have any active medical problems.  Family and Social History: Patient endorse sister and brother has depression.  Patient's son has bipolar disorder.  She lives in [place].  She was born and raised here.  She denies any history of physical sexual verbal or emotional abuse.  She has one son who is 48 year old.  She's working at [employer] for past 17 years.  She is currently on FMLA.  Outpatient Encounter Prescriptions as of 08/02/2012  Medication Sig Dispense Refill  . LORazepam (ATIVAN) 0.5 MG tablet Take 1 tablet (0.5 mg total) by mouth 2 (two) times daily.  60 tablet  0  . sertraline (ZOLOFT) 100 MG tablet Take 1 tablet (100 mg total) by mouth daily.  30 tablet  0  . [DISCONTINUED] LORazepam (ATIVAN) 0.5 MG tablet Take 1 tablet (0.5 mg total) by mouth 2 (two) times daily.  60 tablet  0  . [DISCONTINUED] sertraline (ZOLOFT) 100 MG tablet Take 1 tablet (100 mg total) by mouth daily.  30 tablet  0  . [DISCONTINUED] traZODone (DESYREL) 50 MG tablet Take 1 tablet (50 mg total) by mouth at bedtime and may repeat dose one time if needed.  30 tablet  0    Past Psychiatric History/Hospitalization(s): Anxiety: Yes Bipolar Disorder: No Depression: Yes Mania: No Psychosis: No Schizophrenia: No Personality Disorder: No Hospitalization for psychiatric illness: Yes History of Electroconvulsive Shock Therapy: No Prior Suicide Attempts: No  Physical Exam: Constitutional:  BP 118/75  Pulse 92  Wt 135 lb 9.6 oz (61.508 kg)  General Appearance: alert, oriented, no acute distress  Musculoskeletal: Strength & Muscle Tone: within normal limits Gait & Station: normal Patient leans: N/A  Mental status examination  Patient is casually dressed and fairly groomed. She maintained fair eye contact. She remains guarded and sometimes difficult  to engage in conversation.  Her speech is soft, clear to decrease in tone and volume.  Her speech is non-spontaneous speech. She described her mood is sad and depressed and her affect is flat and constricted. She has psychomotor retardation. There were no tremors or shakes present. Her thought processes slow but  logical. Her attention and concentration is fair.  She still has thought blocking .  Her fund of knowledge is fair. She has a lot of negative thoughts about herself however she denies any active or passive suicidal thoughts or homicidal thoughts. There were no delusion or obsession present. She denies any auditory or visual hallucination. There were no tremors or shakes present. She appears to be in his stated age. She's alert and oriented x3 but she could not do serial 7. Her insight judgment and pulse control is okay.   Medical Decision Making (Choose Three): Established Problem, Stable/Improving (1), Review of Psycho-Social Stressors (1), Review of Last Therapy Session (1), Review of Medication Regimen & Side Effects (2) and Review of New Medication or Change in Dosage (2)  Assessment: Axis I: Maj. depressive disorder  Axis II: Deferred  Axis III: No active medical problems  Axis IV: Mild to moderate  Axis V: 50-55   Plan: I review her symptoms, medication and response to the medication.  Patient still has depressive symptoms but slowly she is getting better.  She's not taking trazodone as she feel increased sertraline is helping her sleep.  She's also seeing therapist.  Talked about going back to work in 2-3 weeks.  I will see her again in February 6 and discuss about starting work on February 11.  I encourage her to participate in individual counseling.  Continue sertraline and Ativan at present does.  I will discontinue trazodone.  Risk and benefit explain.  I will see her again in 2-3 weeks.  Time spent 25 minutes.  More than 50% of time spent and psychoeducation counseling and coordination of care.  Portion of this note is generated with voice dictation software and may contain typographical error.  Marlos Carmen T., MD 08/02/2012

## 2012-08-04 ENCOUNTER — Telehealth (HOSPITAL_COMMUNITY): Payer: Self-pay

## 2012-08-04 NOTE — Telephone Encounter (Signed)
See note from [facility]

## 2012-08-08 ENCOUNTER — Encounter (HOSPITAL_COMMUNITY): Payer: Self-pay | Admitting: Psychiatry

## 2012-08-08 ENCOUNTER — Ambulatory Visit (INDEPENDENT_AMBULATORY_CARE_PROVIDER_SITE_OTHER): Payer: 59 | Admitting: Psychiatry

## 2012-08-08 DIAGNOSIS — F329 Major depressive disorder, single episode, unspecified: Secondary | ICD-10-CM

## 2012-08-08 NOTE — Progress Notes (Signed)
   THERAPIST PROGRESS NOTE  Session Time: 10:30-11:20  Participation Level: Active  Behavioral Response: CasualAlertEuthymic  Type of Therapy: Individual Therapy  Treatment Goals addressed: Coping  Interventions: Strength-based..  Summary: Yvonne [name] is a 47 y.o. female who presents with depression.   Suicidal/Homicidal: Nowithout intent/plan  Therapist Response:  Therapist discussed sleep hygiene, eating schedule and developing healthy leisure activities. Therapist recommended peer support with mental health association and nutrition consult for diet recommendations  Plan: Return again in 1 weeks.  Diagnosis: Axis I: Depressive Disorder NOS    Axis II: No diagnosis    Jonna Clark, LPC, [facility] 08/08/12

## 2012-08-15 ENCOUNTER — Encounter (HOSPITAL_COMMUNITY): Payer: Self-pay | Admitting: Psychiatry

## 2012-08-15 ENCOUNTER — Ambulatory Visit (INDEPENDENT_AMBULATORY_CARE_PROVIDER_SITE_OTHER): Payer: 59 | Admitting: Psychiatry

## 2012-08-15 DIAGNOSIS — F329 Major depressive disorder, single episode, unspecified: Secondary | ICD-10-CM

## 2012-08-15 NOTE — Progress Notes (Signed)
   THERAPIST PROGRESS NOTE  Session Time: 1:00-1:50  Participation Level: Active  Behavioral Response: CasualAlertEuthymic  Type of Therapy: Individual Therapy  Treatment Goals addressed: Anxiety  Interventions:Focus on development of stress management skills, used progressive muscle relaxation and  Breathing exercises   Summary: Yvonne [name] is a 47 y.o. female who presents with MDD.   Suicidal/Homicidal: Nowithout intent/plan  Therapist Response: Pt. Indicated moderate anxiety related to scheduled return to work on 2/11. Pt. Requested support for rescheduling return to work 2/18.  Counselor supports request for additional week as well as commitment to daily stress management exercises.  Plan: Return again in one weeks.  Diagnosis: Axis I: Depressive Disorder NOS    Axis II: No diagnosis    Jonna Clark, LPC, [facility] 08/15/12

## 2012-08-17 ENCOUNTER — Ambulatory Visit (INDEPENDENT_AMBULATORY_CARE_PROVIDER_SITE_OTHER): Payer: 59 | Admitting: Psychiatry

## 2012-08-17 ENCOUNTER — Encounter (HOSPITAL_COMMUNITY): Payer: Self-pay | Admitting: *Deleted

## 2012-08-17 ENCOUNTER — Encounter (HOSPITAL_COMMUNITY): Payer: Self-pay | Admitting: Psychiatry

## 2012-08-17 VITALS — BP 119/80 | HR 80 | Wt 137.0 lb

## 2012-08-17 DIAGNOSIS — F329 Major depressive disorder, single episode, unspecified: Secondary | ICD-10-CM

## 2012-08-17 NOTE — Progress Notes (Signed)
[facility] [address] Progress Note  Nohelani Benning [id number] 47 y.o.  08/17/2012 3:09 PM  Chief Complaint: I feeling tired and sometime cannot sleep very well.      History of presenting illness Patient is 47 year old [place] American female who came for her followup appointment.  She continued to endorse increased anxiety , crying spells and feeling tired.  She's taking Zoloft 100 mg and overall she is tolerating well but sometimes she feels very groggy and sleepy.  She continues to feel some time isolated withdrawn decreased energy.  She start seeing therapist in this office .  She denies any major issue with the Zoloft but sometime feel that she has no energy.  She denies any panic attack but chronic feeling of hopelessness and helplessness.  She feel that she is not ready for work at this time.  She denies any active or passive suicidal thoughts or homicidal thoughts.  She continues to struggle with her depression however she has improved from the past.  She's not drinking or using any illegal substance.  She worries about the future .     Suicidal Ideation: Yes Plan Formed: No Patient has means to carry out plan: No  Homicidal Ideation: No Plan Formed: No Patient has means to carry out plan: No  Review of Systems: Psychiatric: Agitation: No Hallucination: No Depressed Mood: Yes Insomnia: No Hypersomnia: No Altered Concentration: No Feels Worthless: Yes Grandiose Ideas: No Belief In Special Powers: No New/Increased Substance Abuse: No Compulsions: No  Neurologic: Headache: No Seizure: No Paresthesias: No  Past psychiatric history Patient was admitted to behavioral Center due to significant depression and feeling overwhelmed.  Patient denies any history of suicidal attempt psychosis mania or any hallucination.  Medical history Patient does not have any active medical problems.  Family and Social History: Patient endorse sister and brother has depression.   Patient's son has bipolar disorder.  She lives in [place].  She was born and raised here.  She denies any history of physical sexual verbal or emotional abuse.  She has one son who is 76 year old.  She's working at [employer] for past 17 years.  She is currently on FMLA.  Outpatient Encounter Prescriptions as of 08/17/2012  Medication Sig Dispense Refill  . LORazepam (ATIVAN) 0.5 MG tablet Take 1 tablet (0.5 mg total) by mouth 2 (two) times daily.  60 tablet  0  . sertraline (ZOLOFT) 100 MG tablet Take 1 tablet (100 mg total) by mouth daily.  30 tablet  0    Past Psychiatric History/Hospitalization(s): Anxiety: Yes Bipolar Disorder: No Depression: Yes Mania: No Psychosis: No Schizophrenia: No Personality Disorder: No Hospitalization for psychiatric illness: Yes History of Electroconvulsive Shock Therapy: No Prior Suicide Attempts: No  Physical Exam: Constitutional:  BP 119/80  Pulse 80  Wt 137 lb (62.143 kg)  General Appearance: alert, oriented, no acute distress  Musculoskeletal: Strength & Muscle Tone: within normal limits Gait & Station: normal Patient leans: N/A  Mental status examination  Patient is casually dressed and fairly groomed. She maintained fair eye contact.  Her speech is soft clear with decreased volume and tone.  At times she is difficult to engage in conversation.  Her thought processes slow but logical.  She described her mood is sad and depressed and her affect is constricted.  Her psychomotor activity is slow.  Her attention and concentration is fair.  She denies any passive suicidal thoughts but no active suicidal thinking or plan.  She denies any  homicidal thoughts.  She denies any auditory visual hallucination.  She still has some thought blocking.  There were no delusion or paranoid thinking.  There were no tremors or shakes present.  She's alert and oriented x3.  Her insight judgment and impulse control is okay.  Medical Decision  Making (Choose Three): Established Problem, Stable/Improving (1), Review of Psycho-Social Stressors (1), Review of Last Therapy Session (1), Review of Medication Regimen & Side Effects (2) and Review of New Medication or Change in Dosage (2)  Assessment: Axis I: Maj. depressive disorder  Axis II: Deferred  Axis III: No active medical problems  Axis IV: Mild to moderate  Axis V: 50-55   Plan: Patient is still struggling with her chronic depression and anxiety.  Her medications were increased recently and she is tolerating with some superficial side effects.  She start seeing therapist.  I will continue her disability from for 2 weeks.  I will see her again on February 20 .  We'll reconsider starting her job on February 26.  For now she will continue her current psychiatric medication .  I recommend to see therapist regularly.  Risk and benefits explain in detail.  Time spent 25 minutes.  More than 50% of time spent and psychoeducation counseling and coordination of care.    Portion of this note is generated with voice dictation software and may contain typographical error.  ARFEEN,SYED T., MD 08/17/2012

## 2012-08-21 ENCOUNTER — Telehealth (HOSPITAL_COMMUNITY): Payer: Self-pay

## 2012-08-22 ENCOUNTER — Encounter (HOSPITAL_COMMUNITY): Payer: Self-pay | Admitting: *Deleted

## 2012-08-31 ENCOUNTER — Encounter (HOSPITAL_COMMUNITY): Payer: Self-pay | Admitting: Psychiatry

## 2012-08-31 ENCOUNTER — Ambulatory Visit (INDEPENDENT_AMBULATORY_CARE_PROVIDER_SITE_OTHER): Payer: 59 | Admitting: Psychiatry

## 2012-08-31 VITALS — BP 115/77 | HR 96 | Wt 136.8 lb

## 2012-08-31 DIAGNOSIS — F329 Major depressive disorder, single episode, unspecified: Secondary | ICD-10-CM

## 2012-08-31 MED ORDER — SERTRALINE HCL 100 MG PO TABS: 100.0000 mg | ORAL_TABLET | Freq: Every day | ORAL | Status: DC

## 2012-08-31 MED ORDER — LORAZEPAM 0.5 MG PO TABS: 0.5000 mg | ORAL_TABLET | Freq: Two times a day (BID) | ORAL | Status: DC

## 2012-08-31 NOTE — Progress Notes (Signed)
[facility] [address] Progress Note  Yvonne [name] [id number] 47 y.o.  08/31/2012 1:41 PM  Chief Complaint:  I'm doing better.        History of presenting illness Patient is 47 year old [place] American female who came for her followup appointment.  She is doing better on her medication.  She denies any recent crying spells.  Her sleep is much improved.  Her anxiety and crying spells is also much improved.  She feels ready to restart her work.  She is tolerating her medication without any side effects.  She no longer needed to see therapist.  She's more social and her grooming is much improved.  Her attention and concentration is also improved from the past.  She has no tremors or shakes.  She denies any recent panic attack.  She wants to continue her current medication.  She's not drinking or using any illegal substance.  She is excited about upcoming birthday.  She hopes her family celebrates her birthday.   Suicidal Ideation: No Plan Formed: No Patient has means to carry out plan: No  Homicidal Ideation: No Plan Formed: No Patient has means to carry out plan: No  Review of Systems: Psychiatric: Agitation: No Hallucination: No Depressed Mood: No Insomnia: No Hypersomnia: No Altered Concentration: No Feels Worthless: No Grandiose Ideas: No Belief In Special Powers: No New/Increased Substance Abuse: No Compulsions: No  Neurologic: Headache: No Seizure: No Paresthesias: No  Past psychiatric history Patient was admitted to behavioral Center due to significant depression and feeling overwhelmed.  Patient denies any history of suicidal attempt psychosis mania or any hallucination.  Medical history Patient does not have any active medical problems.  Family and Social History: Patient endorse sister and brother has depression.  Patient's son has bipolar disorder.  She lives in [place].  She was born and raised here.  She denies any history of physical  sexual verbal or emotional abuse.  She has one son who is 38 year old.  She's working at [employer] for past 17 years.  She is currently on FMLA.  Outpatient Encounter Prescriptions as of 08/31/2012  Medication Sig Dispense Refill  . LORazepam (ATIVAN) 0.5 MG tablet Take 1 tablet (0.5 mg total) by mouth 2 (two) times daily.  60 tablet  1  . sertraline (ZOLOFT) 100 MG tablet Take 1 tablet (100 mg total) by mouth daily.  30 tablet  1  . [DISCONTINUED] LORazepam (ATIVAN) 0.5 MG tablet Take 1 tablet (0.5 mg total) by mouth 2 (two) times daily.  60 tablet  0  . [DISCONTINUED] sertraline (ZOLOFT) 100 MG tablet Take 1 tablet (100 mg total) by mouth daily.  30 tablet  0   No facility-administered encounter medications on file as of 08/31/2012.    Past Psychiatric History/Hospitalization(s): Anxiety: Yes Bipolar Disorder: No Depression: Yes Mania: No Psychosis: No Schizophrenia: No Personality Disorder: No Hospitalization for psychiatric illness: Yes History of Electroconvulsive Shock Therapy: No Prior Suicide Attempts: No  Physical Exam: Constitutional:  BP 115/77  Pulse 96  Wt 136 lb 12.8 oz (62.052 kg)  BMI 25.01 kg/m2  General Appearance: alert, oriented, no acute distress  Musculoskeletal: Strength & Muscle Tone: within normal limits Gait & Station: normal Patient leans: N/A  Mental status examination  Patient is casually dressed and well groomed.  Her affect is improved from the past.  She maintained good eye contact.  She's cooperative and pleasant.  She is relevant in conversation.  Her speech is clear coherent were normal tone  volume.  Her thought processes are logical linear and goal-directed.  There were no flight of ideas or any loose association.  She denies any active or passive suicidal thoughts or homicidal thoughts.  She described her mood is good.  She denies any auditory or visual hallucination.  There were no paranoia delusions obsession present at this time.   There were no tremors or shakes.  She's alert and oriented x3.  Her insight judgment and impulse control is okay.  Medical Decision Making (Choose Three): Established Problem, Stable/Improving (1), Review of Psycho-Social Stressors (1), Review of Last Therapy Session (1) and Review of Medication Regimen & Side Effects (2)  Assessment: Axis I: Maj. depressive disorder  Axis II: Deferred  Axis III: No active medical problems  Axis IV: Mild to moderate  Axis V: 50-55   Plan:  Patient is doing better on her current psychiatric medication.  She feels ready to restart her work .  We discuss if she requires reduce hours initially and then gradually go to full-time but patient felt comfortable starting full-time work.  I will authorize to restart her work starting February 27.  Risk and benefits explain in detail.  Patient does not want to see therapist at this time.  I will continue Zoloft and Ativan as prescribed.  She's not drinking or using any drugs.  She is not asking for early refills.  I will see her again in 2 months.  Portion of this note is generated with voice dictation software and may contain typographical error.  Kriss Ishler T., MD 08/31/2012

## 2012-10-12 ENCOUNTER — Telehealth (HOSPITAL_COMMUNITY): Payer: Self-pay | Admitting: *Deleted

## 2012-10-12 NOTE — Telephone Encounter (Signed)
Patient wanted to know if there was any type of alcohol that was okay to drink with the medicines she is prescribed.Advised pt not to drink alcohol  while taking these medicines.Pt verbalized understanding.

## 2012-10-18 ENCOUNTER — Encounter (HOSPITAL_COMMUNITY): Payer: Self-pay | Admitting: Psychiatry

## 2012-10-18 ENCOUNTER — Ambulatory Visit (INDEPENDENT_AMBULATORY_CARE_PROVIDER_SITE_OTHER): Payer: 59 | Admitting: Psychiatry

## 2012-10-18 VITALS — BP 110/85 | HR 76 | Wt 129.0 lb

## 2012-10-18 DIAGNOSIS — F329 Major depressive disorder, single episode, unspecified: Secondary | ICD-10-CM

## 2012-10-18 MED ORDER — SERTRALINE HCL 100 MG PO TABS: 100.0000 mg | ORAL_TABLET | Freq: Every day | ORAL | Status: DC

## 2012-10-18 NOTE — Progress Notes (Signed)
[facility] [address] Progress Note  Yvonne [name] [id number] 47 y.o.  10/18/2012 4:12 PM  Chief Complaint:  I'm doing better but I have a lot of issues that might work.          History of presenting illness Patient is 47 year old [place] American female who came for her followup appointment.  She's compliant with her medication and to put her no side effects however she is very stressed about her job.  Since she is back to work she has been working in various department and have not had enough rest.  Sometimes she feels very stressed and overwhelmed.  She has FMLA however that was for continuous time off.  She is wondering if she can have intermittent FMLA so that she can take time off and she is very stressed and overwhelmed.  Overall her depression is better.  She denies any limitation in her mood swing issues any crying spells.  She is sleeping better.  She's not drinking or using any substances.  Suicidal Ideation: No Plan Formed: No Patient has means to carry out plan: No  Homicidal Ideation: No Plan Formed: No Patient has means to carry out plan: No  Review of Systems: Psychiatric: Agitation: No Hallucination: No Depressed Mood: No Insomnia: No Hypersomnia: No Altered Concentration: No Feels Worthless: No Grandiose Ideas: No Belief In Special Powers: No New/Increased Substance Abuse: No Compulsions: No  Neurologic: Headache: No Seizure: No Paresthesias: No  Past psychiatric history Patient was admitted to behavioral Center due to significant depression and feeling overwhelmed.  Patient denies any history of suicidal attempt psychosis mania or any hallucination.  Medical history Patient does not have any active medical problems.  Family and Social History: Patient endorse sister and brother has depression.  Patient's son has bipolar disorder.  She lives in [place].  She was born and raised here.  She denies any history of physical sexual  verbal or emotional abuse.  She has one son who is 27 year old.  She's working at [employer] for past 17 years.  She is currently on FMLA.  Outpatient Encounter Prescriptions as of 10/18/2012  Medication Sig Dispense Refill  . LORazepam (ATIVAN) 0.5 MG tablet Take 1 tablet (0.5 mg total) by mouth 2 (two) times daily.  60 tablet  1  . sertraline (ZOLOFT) 100 MG tablet Take 1 tablet (100 mg total) by mouth daily.  30 tablet  1  . [DISCONTINUED] sertraline (ZOLOFT) 100 MG tablet Take 1 tablet (100 mg total) by mouth daily.  30 tablet  1  . [DISCONTINUED] sertraline (ZOLOFT) 100 MG tablet Take 1 tablet (100 mg total) by mouth daily.  30 tablet  1   No facility-administered encounter medications on file as of 10/18/2012.    Past Psychiatric History/Hospitalization(s): Anxiety: Yes Bipolar Disorder: No Depression: Yes Mania: No Psychosis: No Schizophrenia: No Personality Disorder: No Hospitalization for psychiatric illness: Yes History of Electroconvulsive Shock Therapy: No Prior Suicide Attempts: No  Physical Exam: Constitutional:  BP 110/85  Pulse 76  Wt 129 lb (58.514 kg)  BMI 23.59 kg/m2  General Appearance: alert, oriented, no acute distress  Musculoskeletal: Strength & Muscle Tone: within normal limits Gait & Station: normal Patient leans: N/A  Mental status examination  Patient is casually dressed and well groomed.  She maintained good eye contact.  She's cooperative and pleasant.  She is relevant in conversation.  Her speech is clear coherent were normal tone volume.  Her thought processes are logical linear and goal-directed.  There were no flight of ideas or any loose association.  She denies any active or passive suicidal thoughts or homicidal thoughts.  She described her mood is anxious related to her job and her affect is mood appropriate.  She denies any auditory or visual hallucination.  There were no paranoia delusions obsession present at this time.  There were no  tremors or shakes.  She's alert and oriented x3.  Her insight judgment and impulse control is okay.  Medical Decision Making (Choose Three): Established Problem, Stable/Improving (1), Review of Psycho-Social Stressors (1), Review of Last Therapy Session (1) and Review of Medication Regimen & Side Effects (2)  Assessment: Axis I: Maj. depressive disorder  Axis II: Deferred  Axis III: No active medical problems  Axis IV: Mild to moderate  Axis V: 50-55   Plan:  Patient is doing better on her current psychiatric medication.  I recommend to contact her employer for intermittent FMLA.  I will continue her current psychiatric medication.  Recommend to call us back if she isn't question concerns or worsening of the symptom.  I will see her again in 2 months.  Portion of this note is generated with voice dictation software and may contain typographical error.  ARFEEN,SYED T., MD 10/18/2012

## 2012-10-31 ENCOUNTER — Ambulatory Visit (HOSPITAL_COMMUNITY): Payer: Self-pay | Admitting: Psychiatry

## 2012-10-31 ENCOUNTER — Telehealth (HOSPITAL_COMMUNITY): Payer: Self-pay

## 2012-11-09 ENCOUNTER — Telehealth (HOSPITAL_COMMUNITY): Payer: Self-pay | Admitting: *Deleted

## 2012-11-09 NOTE — Telephone Encounter (Addendum)
Call from [employer].Left [id number] paper work requesting  pt have1 day off every 2 weeks. Need estimated length of accomodation, i.e. 3 mos/6 mos/or a year? Reference claim # [id number]

## 2012-11-10 ENCOUNTER — Telehealth (HOSPITAL_COMMUNITY): Payer: Self-pay

## 2012-11-10 NOTE — Telephone Encounter (Signed)
11/10/12 Recd called from pt's insurance claim #[id number] requesting the duration of certification for the 1-day off every two weeks. S/w Dr. Lolly Mustache and the duration will be 20-months./sh

## 2012-11-13 ENCOUNTER — Other Ambulatory Visit (HOSPITAL_COMMUNITY): Payer: Self-pay | Admitting: *Deleted

## 2012-11-13 DIAGNOSIS — F329 Major depressive disorder, single episode, unspecified: Secondary | ICD-10-CM

## 2012-11-13 MED ORDER — LORAZEPAM 0.5 MG PO TABS: 0.5000 mg | ORAL_TABLET | Freq: Two times a day (BID) | ORAL | Status: DC

## 2012-11-13 NOTE — Telephone Encounter (Signed)
Dr.Arfeen authorized prescription for xanax to be called in

## 2012-11-20 ENCOUNTER — Telehealth (HOSPITAL_COMMUNITY): Payer: Self-pay | Admitting: Psychiatry

## 2012-11-20 ENCOUNTER — Telehealth (HOSPITAL_COMMUNITY): Payer: Self-pay

## 2012-11-20 NOTE — Telephone Encounter (Signed)
Call returned .  Spoke to sister , patient has given consent to spoke with the sister.  Sister is concerned about her behavior.  She's been escalating and having issues with her family members especially dad.  Patient is not coming to see therapist.  She scheduled to see me this Thursday.  I recommend that if sister feels that she need to be seen early than call us for early appointment .  I also recommend that if patient cannot come for an early appointment then sister should come with her on her next appointment which is may 15 at 4:15 PM .  I also recommend that if she started to have any suicidal thoughts or homicidal thoughts or if someone life is in danger due to her behavior than family should take her to the emergency room.  Sister agreed with the plan.

## 2012-11-21 ENCOUNTER — Telehealth (HOSPITAL_COMMUNITY): Payer: Self-pay | Admitting: Psychiatry

## 2012-11-21 NOTE — Telephone Encounter (Signed)
Patient's sister wants to come on her appointment.  I explained if patient has given consent than I have no problem

## 2012-11-23 ENCOUNTER — Ambulatory Visit (HOSPITAL_COMMUNITY): Payer: Self-pay | Admitting: Psychiatry

## 2012-11-29 ENCOUNTER — Encounter (HOSPITAL_COMMUNITY): Payer: Self-pay | Admitting: Psychiatry

## 2012-11-29 ENCOUNTER — Ambulatory Visit (INDEPENDENT_AMBULATORY_CARE_PROVIDER_SITE_OTHER): Payer: 59 | Admitting: Psychiatry

## 2012-11-29 VITALS — BP 127/80 | HR 80 | Ht 62.0 in | Wt 129.0 lb

## 2012-11-29 DIAGNOSIS — F329 Major depressive disorder, single episode, unspecified: Secondary | ICD-10-CM

## 2012-11-29 MED ORDER — LAMOTRIGINE 25 MG PO TABS: 25.0000 mg | ORAL_TABLET | Freq: Every day | ORAL | Status: DC
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
## 2012-12-04 ENCOUNTER — Encounter (HOSPITAL_COMMUNITY): Payer: Self-pay | Admitting: *Deleted

## 2012-12-04 ENCOUNTER — Ambulatory Visit (HOSPITAL_COMMUNITY)
Admission: RE | Admit: 2012-12-04 | Discharge: 2012-12-04 | Disposition: A | Discharge status: Home / Self Care | Payer: 59 | Attending: Psychiatry | Admitting: Psychiatry

## 2012-12-04 DIAGNOSIS — F321 Major depressive disorder, single episode, moderate: Secondary | ICD-10-CM | POA: Insufficient documentation

## 2012-12-04 HISTORY — DX: Other specified health status: Z78.9

## 2012-12-04 NOTE — H&P (Signed)
Behavioral Health Medical Screening Exam  Yvonne [name] is an 47 y.o. female.  Review of Systems  Constitutional: Negative.  Negative for fever, chills, weight loss, malaise/fatigue and diaphoresis.  HENT: Negative for congestion and sore throat.   Eyes: Negative for blurred vision, double vision and photophobia.  Respiratory: Negative for cough, shortness of breath and wheezing.   Cardiovascular: Negative for chest pain, palpitations and PND.  Gastrointestinal: Negative for heartburn, nausea, vomiting, abdominal pain, diarrhea and constipation.  Musculoskeletal: Negative for myalgias, joint pain and falls.  Neurological: Negative for dizziness, tingling, tremors, sensory change, speech change, focal weakness, seizures, loss of consciousness, weakness and headaches.  Endo/Heme/Allergies: Negative for polydipsia. Does not bruise/bleed easily.  Psychiatric/Behavioral: Positive for depression. Negative for suicidal ideas, hallucinations, memory loss and substance abuse. The patient is nervous/anxious. The patient does not have insomnia.     Physical Exam  Constitutional: She is oriented to person, place, and time. She appears well-developed and well-nourished.  HENT:  Head: Normocephalic and atraumatic.  Eyes: Pupils are equal, round, and reactive to light.  Neck: Normal range of motion. No JVD present. No tracheal deviation present. No thyromegaly present.  Cardiovascular: Normal rate, normal heart sounds and intact distal pulses.  Exam reveals no gallop and no friction rub.   No murmur heard. Respiratory: Breath sounds normal. No stridor. No respiratory distress. She has no wheezes. She has no rales. She exhibits no tenderness.  GI: Soft. Bowel sounds are normal. She exhibits no distension and no mass. There is no tenderness. There is no rebound and no guarding.  Musculoskeletal: Normal range of motion. She exhibits no edema and no tenderness.  Lymphadenopathy:    She has no cervical  adenopathy.  Neurological: She is alert and oriented to person, place, and time. She has normal reflexes.  Skin: Skin is warm and dry.  Psychiatric: She has a normal mood and affect. Her behavior is normal. Judgment and thought content normal.    Blood pressure 122/80, pulse 84, weight 58.514 kg (129 lb).  Recommendations:  Based on my evaluation the patient does not appear to have an emergency medical condition.  Yvonne [name] 12/04/2012, 2:59 PM
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
## 2012-12-05 ENCOUNTER — Encounter (HOSPITAL_COMMUNITY): Payer: Self-pay | Admitting: Psychiatry

## 2012-12-05 ENCOUNTER — Other Ambulatory Visit (HOSPITAL_COMMUNITY): Payer: 59 | Attending: Psychiatry | Admitting: Psychiatry

## 2012-12-05 ENCOUNTER — Other Ambulatory Visit (HOSPITAL_COMMUNITY): Payer: Self-pay | Admitting: *Deleted

## 2012-12-05 DIAGNOSIS — F321 Major depressive disorder, single episode, moderate: Secondary | ICD-10-CM | POA: Insufficient documentation

## 2012-12-05 DIAGNOSIS — F329 Major depressive disorder, single episode, unspecified: Secondary | ICD-10-CM

## 2012-12-05 NOTE — Progress Notes (Signed)
    Daily Group Progress Note  Program: IOP  Group Time: 9:00-10:30 am   Participation Level: Active  Behavioral Response: talkative  Type of Therapy:  Process Group  Summary of Progress: Today was patients first day in the group. She immediatly engaged in conversation and began giving advice and encouragement to another group member. She appeared comfortable and engaged.      Group Time: 10:30 am - 12:00 pm    Participation Level:  Active  Behavioral Response: Appropriate  Type of Therapy: Psycho-education Group  Summary of Progress: Patient participated in a goodbye ceremony to a member leaving the group and practiced the skill of having healthy closure.   Carman Ching, LCSW
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
## 2012-12-06 ENCOUNTER — Other Ambulatory Visit (HOSPITAL_COMMUNITY): Payer: 59 | Admitting: Psychiatry

## 2012-12-06 DIAGNOSIS — F329 Major depressive disorder, single episode, unspecified: Secondary | ICD-10-CM

## 2012-12-06 NOTE — Progress Notes (Signed)
    Daily Group Progress Note  Program: IOP  Group Time: 9:00-10:30 am   Participation Level: Active  Behavioral Response: Rigid, Disruptive, Monopolizing and Agitated  Type of Therapy:  Process Group  Summary of Progress: Pt presented with agitated mood and affect and had an intensity about her when she spoke. She monopolized the conversation and would interrupt other members. Pt became defensive when redirected and given this feedback by Clinical research associate and group members. Three group members expressed high anxiety associated with Pts behavior and Pt was given this feedback directly, which she struggled to hear. Pt is being assessed for appropriate group social skills and the ability to be able to function in a group setting.      Group Time: 10:30 am - 12:00 pm   Participation Level:  Active  Behavioral Response: Resistant  Type of Therapy: Psycho-education Group  Summary of Progress:  Pt was educated on group therapy and how to make it be effective and what to avoid to make it in affective. Ground rules for behavior were discussed.   Carman Ching, LCSW

## 2012-12-07 ENCOUNTER — Other Ambulatory Visit (HOSPITAL_COMMUNITY): Payer: 59 | Admitting: Psychiatry

## 2012-12-07 DIAGNOSIS — F329 Major depressive disorder, single episode, unspecified: Secondary | ICD-10-CM

## 2012-12-07 NOTE — Progress Notes (Signed)
    Daily Group Progress Note  Program: IOP  Group Time: 9:00-10:30 am   Participation Level: Active  Behavioral Response: Appropriate  Type of Therapy:  Process Group  Summary of Progress: Patient participated in an education segment on using meditation to manage anxiety and depression symptoms.        Group Time: 10:30 am - 12:00 pm   Participation Level:  Active  Behavioral Response: Appropriate  Type of Therapy: Psycho-education Group  Summary of Progress: Patient practiced a guided meditation that allowed Pt to implement the skill of meditation and then discussed the benefits to reducing anxiety symptoms.    Rawlin Reaume E, LCSW 

## 2012-12-08 ENCOUNTER — Telehealth (HOSPITAL_COMMUNITY): Payer: Self-pay | Admitting: Psychiatry

## 2012-12-08 ENCOUNTER — Other Ambulatory Visit (HOSPITAL_COMMUNITY): Payer: 59

## 2012-12-11 ENCOUNTER — Other Ambulatory Visit (HOSPITAL_COMMUNITY): Payer: 59

## 2012-12-11 ENCOUNTER — Telehealth (HOSPITAL_COMMUNITY): Payer: Self-pay | Admitting: Psychiatry

## 2012-12-11 DIAGNOSIS — F319 Bipolar disorder, unspecified: Secondary | ICD-10-CM | POA: Insufficient documentation

## 2012-12-12 ENCOUNTER — Other Ambulatory Visit (HOSPITAL_COMMUNITY): Payer: 59 | Admitting: Psychiatry

## 2012-12-12 ENCOUNTER — Other Ambulatory Visit (HOSPITAL_COMMUNITY): Payer: Self-pay | Admitting: Psychiatry

## 2012-12-12 DIAGNOSIS — F329 Major depressive disorder, single episode, unspecified: Secondary | ICD-10-CM

## 2012-12-12 NOTE — Progress Notes (Signed)
    Daily Group Progress Note  Program: IOP  Group Time: 9:00-10:30 am   Participation Level: Active  Behavioral Response: Agitated  Type of Therapy:  Process Group  Summary of Progress: pt told group that she would be leaving at 10:00 am today because she is "not feeling group today" (her words) and was "unders too much stress to attend". Pt was agitated when talking about her fathers situation and how she is the primary caretaker for him when his wife's children should be acting in this role. Pt was encouraged to stay and work on her stress, but refused. Pts agitation was so high that one group member requested to leave because she could not handle the intensity of this patients emotions.      Group Time: 10:30 am - 12:00 pm   Participation Level:  None  Behavioral Response: none  Type of Therapy: Psycho-education Group  Summary of Progress: Pt left group and did not participate.   [facility]

## 2012-12-12 NOTE — Telephone Encounter (Signed)
Patient currently participating in IOP. Dr.Tadepalli consulted regarding refill request, states if prescription is due is okay to refill

## 2012-12-13 ENCOUNTER — Other Ambulatory Visit (HOSPITAL_COMMUNITY): Payer: 59 | Admitting: Psychiatry

## 2012-12-13 DIAGNOSIS — F329 Major depressive disorder, single episode, unspecified: Secondary | ICD-10-CM
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
## 2012-12-13 NOTE — Progress Notes (Signed)
    Daily Group Progress Note  Program: IOP  Group Time: 9:00-10:30 am   Participation Level: Active  Behavioral Response: Appropriate  Type of Therapy:  Process Group  Summary of Progress: Pt continues to present with intense emotions that are difficult for other members to manage. Pt is unaware of the intensity of her actions and how this impacts others. The doctor is considering adding Risperdal to reduce intensity of feelings and assist Pt with being able to manage her emotions more effectively.      Group Time: 10:30 am - 12:00 pm   Participation Level:  Active  Behavioral Response: Appropriate  Type of Therapy: Psycho-education Group  Summary of Progress: Pt was introduced to the DBT Distress Tolerance skill of "if you can fix it fix it" and explored unhealthy ways of coping with stress that can lead to increased stress in other areas.    Carman Ching, LCSW

## 2012-12-14 ENCOUNTER — Telehealth (HOSPITAL_COMMUNITY): Payer: Self-pay | Admitting: Psychiatry

## 2012-12-14 ENCOUNTER — Other Ambulatory Visit (HOSPITAL_COMMUNITY): Payer: 59

## 2012-12-15 ENCOUNTER — Telehealth (HOSPITAL_COMMUNITY): Payer: Self-pay | Admitting: Psychiatry

## 2012-12-15 ENCOUNTER — Other Ambulatory Visit (HOSPITAL_COMMUNITY): Payer: 59

## 2012-12-18 ENCOUNTER — Telehealth (HOSPITAL_COMMUNITY): Payer: Self-pay

## 2012-12-18 ENCOUNTER — Other Ambulatory Visit (HOSPITAL_COMMUNITY): Payer: 59 | Attending: Psychiatry | Admitting: Psychiatry

## 2012-12-18 DIAGNOSIS — F329 Major depressive disorder, single episode, unspecified: Secondary | ICD-10-CM

## 2012-12-18 NOTE — Progress Notes (Signed)
Patient ID: Yvonne [name], female   DOB: 1965-12-08, 47 y.o.   MRN: [id number] Pt seen upset since her Dad asked her to stop taking care of him and his wife. Pt upset about that , very angry reccomended Risperdal for mood stabilization , butpt refusing , doesn't want more meds. Sleep-good, app-fair, no Suicidal/ homicidal ideation, no hallucinations/ delusions. Cont meds.

## 2012-12-19 ENCOUNTER — Telehealth (HOSPITAL_COMMUNITY): Payer: Self-pay | Admitting: Psychiatry

## 2012-12-19 ENCOUNTER — Telehealth (HOSPITAL_COMMUNITY): Payer: Self-pay

## 2012-12-19 ENCOUNTER — Other Ambulatory Visit (HOSPITAL_COMMUNITY): Payer: 59

## 2012-12-19 DIAGNOSIS — F329 Major depressive disorder, single episode, unspecified: Secondary | ICD-10-CM
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
## 2012-12-19 NOTE — Progress Notes (Signed)
Discharge Note  Patient:  Yvonne [name] is an 47 y.o., female DOB:  01/28/1966  Date of Admission:12/05/12  Date of Discharge:  12/19/12  Reason for Admission  depression and anxiety  Hospital course  :Pt reffered by Dr Lolly Mustache, from his outpatient clinic, for treatment I IOP. Patient started groups and was continued on her medications on Lamictal 25 mg every day, lorazepam 0.5 mg at bedtime and Zoloft 100 mg every day. Patient's major issue was the care taking off her father which was causing significant conflict between her sister's and her stepmother's children. Patient had been doing everything for her father and her father finally informed her that he did not want her to take care of him. Patient was upset over this issue but then decided to back off.   in groups patient was loud and came across as being threatening, been redirected had a hard time accepting feedback. She was also quite irritable and argumentative. Her mood was very labile and I recommended starting her on Risperdal which the patient stated she did not want to take. 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 At this time it was discussed with her that she could not solicit business here, patient became argumentative stating that if the other patients did not want to accept the flier, then THEY. needed to let her know. When told that they were not comfortable letting her know  and so had brought it to our attention. Patient became significantly upset and angry, stating that she was only trying to help , others make money. I again discussed her mood lability and anger and recommended Risperdal at which point she became upset and stated she and didn't want to continue here and walked out of my office.. And so patient will be  discharged today   Mental Status at Discharge: alert, oriented x3, affect is labile mood is very angry and hostile, denied suicidal or homicidal ideation and no hallucinations or delusions. Patient does have mild processing difficulty. Recent and remote memory is fair judgment is fair insight is poor, concentration and recall are fair   Lab Results: No results found for this or any previous visit (from the past 48 hour(s)).  Current outpatient prescriptions:lamoTRIgine (LAMICTAL) 25 MG tablet, Take 1 tablet (25 mg total) by mouth daily., Disp: 30 tablet, Rfl: 0;  LORazepam (ATIVAN) 0.5 MG tablet, TAKE 1 TABLET TWICE A DAY, Disp: 60 tablet, Rfl: 0;  sertraline (ZOLOFT) 100 MG tablet, Take 1 tablet (100 mg total) by mouth daily., Disp: 30 tablet, Rfl: 1  Axis Diagnosis:   Axis I: Generalized Anxiety Disorder, Major Depression, Recurrent severe and Family relational problems Axis II: Cluster B Traits Axis III:  Past Medical History  Diagnosis Date  . No pertinent past medical history   . Medical history non-contributory    Axis IV: other psychosocial or environmental problems, problems related to social environment and problems with primary support group Axis V: 61-70 mild symptoms   Level of Care:  OP  Discharge destination:  Home  Is patient on multiple antipsychotic therapies at discharge:  No    Has Patient had three or more failed trials of antipsychotic monotherapy by history:  No  Patient phone:  [phone] (home)  Patient address:   [address] [place] [address],   Follow-up recommendations:  Activity:  As tolerated Diet:  Regular Other:  Followup with Dr. Lolly Mustache and will be referred for therapy by Dr. Marilynne Drivers en  Comments:    The patient received suicide prevention pamphlet:  Yes   Margit Banda 12/19/2012, 12:42 PM

## 2012-12-20 ENCOUNTER — Encounter (HOSPITAL_COMMUNITY): Payer: Self-pay

## 2012-12-20 ENCOUNTER — Other Ambulatory Visit (HOSPITAL_COMMUNITY): Payer: 59

## 2012-12-20 NOTE — Progress Notes (Unsigned)
Daily Group Progress Note  Program: IOP [facility]          [facility] Outpatient Department              [facility]          [facility] Outpatient Department              Daily Group Progress Notes  9:00     Participation Level:       ____Minimal          _X_Active       ____ None    Participation Quality    ____Appropriate   ____ Attentive   __ _Sharing     __ _ Supportive       ____Intrusive                                         __X__Monopolizing  ____ Resistant     __  __Drowsy     ____ Other:____________ Affect:      ____Appropriate     __X__Excited     _  __ Anxious      __ __ Depressed   ____Labile         _ _Angry Cognitive: __X_ Appropriate    _ __Oriented ____ Confused   __  __Alert  _____Delusional  ____Hallucinating Insight/Engagement in Therapy:   ____None         _X___ Minimal      _ __ Alliance-Forming Modes of Intervention: ____Clarification         __X_ Exploration           ____Limit-setting           ____Orientation                                          _ _Reality-Testing       _ _ _Confrontation        ____Role-Play               ____Validation                                          __ _Support           __ __Education        ___ Problem-solving    ____Socialization  Type of Therapy:  Process Group Summary of Progress/ Problems Addressed:   Pt.'s participation focused on providing advice to other group members and limited insight regarding her own behavior and influence on the group process. Participated in [place] intervention.      Staff Signature/Credentials   _____Jennifer Manson Passey, Ph.D., NCC, LPC__________________________             Time Ended:  1030  10:45                     Participation Level:       __ __Minimal          _X_Active       __ None    Participation Quality    ___Appropriate   ___ Attentive    _ __Sharing  ____ Supportive       ____Intrusive                __X__Monopolizing  __ __ Resistant     __  __Drowsy     ____ Other:___________________  Affect:      _ __Appropriate     __X__Excited     _  ___ Anxious      ___ Depressed   ____Labile         Gilda Crease Cognitive: __X __ Appropriate    ____ Oriented    ____ Confused   Molinda Bailiff _____Delusional   ____Hallucinating Insight/Engagement in Therapy:   ____None         Juliann Pares __ Minimal      ____ Alliance-Forming Modes of Intervention: _ __Clarification         __X__Exploration           ____Limit-setting           ____Orientation                                           ___Reality-Testing       _ ___Confrontation        ____Role-Play               ____Validation                                          _  __ Support           __ X__Education        _ __ Problem-solving    ____Socialization  Type of Therapy:  PsychoEd   Summary of Progress/Problems Addressed:  Pt. was alert and attentive during group focused on identifying, validating, and uncovering emotions underlying anger.    Staff Signature/Credentials ______Jennifer Manson Passey, Ph.D., NCC, LPC_________________________                Time Ended:  12 Noon

## 2012-12-21 ENCOUNTER — Other Ambulatory Visit (HOSPITAL_COMMUNITY): Payer: 59

## 2012-12-21 NOTE — Progress Notes (Unsigned)
[facility]          [facility] Outpatient Department              Daily Group Progress Notes  Program: Psych IOP            Time Start: 0900  Participation Level:       ____Minimal          _X_Active       ____ None    Participation Quality    ____Appropriate   __X__ Attentive   __ _Sharing     __ _ Supportive       ____Intrusive                                         __X__Monopolizing  ____ Resistant     __  __Drowsy     ____ Other:____________ Affect:      ____Appropriate     __X__Excited     _  __ Anxious      __ __ Depressed   ____Labile         _ _Angry Cognitive: ___ Appropriate    _ __Oriented ____ Confused   __ X__Alert  _____Delusional  ____Hallucinating Insight/Engagement in Therapy:   ____None         _ ___ Minimal      _ X__ Alliance-Forming Modes of Intervention: __X__Clarification         __X_ Exploration           __X__Limit-setting           ____Orientation                                          _ _Reality-Testing       _ _ _Confrontation        ____Role-Play               ____Validation                                          __ _Support           __ __Education        ___ Problem-solving    ____Socialization  Type of Therapy:  Process Group Summary of Progress/ Problems Addressed:   Pt. participated in group process. Participated in [place] intervention. Pt. indicated thumbs down during nonverbal check-in. Pt. required significant redirection of tendency to monopolize group process. Pt. discussed setting new boundaries in family relationships. Pt. appropriately encouraged a group member who had been quiet to share her story.     Staff Signature/Credentials   _____Jennifer Manson Passey, Ph.D., NCC, LPC__________________________             Time Ended:  1030                     Time Start: 1045  Participation Level:       __ __Minimal          _X_Active       __ None    Participation Quality    ___Appropriate   ___ Attentive    _ __Sharing      ____ Supportive  ____Intrusive                                         __X__Monopolizing  __ __ Resistant     __  __Drowsy     ____ Other:___________________  Affect:      _ __Appropriate     __X__Excited     _  ___ Anxious      ___ Depressed   ____Labile         Gilda Crease Cognitive: __  __ Appropriate    ____ Oriented    ____ Confused   Juliann Pares _Alert _____Delusional   ____Hallucinating Insight/Engagement in Therapy:   ____None         _X__ Minimal      ____ Alliance-Forming Modes of Intervention: _ __Clarification         __X__Exploration           __X__Limit-setting           ____Orientation                                           ___Reality-Testing       _ ___Confrontation        ____Role-Play               ____Validation                                          _  __ Support           __ X __Education        _ __ Problem-solving    ____Socialization  Type of Therapy:  PsychoEd   Summary of Progress/Problems Addressed:  Pt. participated in discussion about the stigma associated with mental illness and how the absence of vulnerability in the workplace contributes to work-related distress.     Staff Signature/Credentials ______Jennifer Manson Passey, Ph.D., NCC, LPC_________________________                Time Ended:  12 Noon

## 2012-12-22 ENCOUNTER — Telehealth (HOSPITAL_COMMUNITY): Payer: Self-pay

## 2012-12-22 ENCOUNTER — Other Ambulatory Visit (HOSPITAL_COMMUNITY): Payer: 59

## 2012-12-25 ENCOUNTER — Other Ambulatory Visit (HOSPITAL_COMMUNITY): Payer: 59

## 2012-12-26 ENCOUNTER — Other Ambulatory Visit (HOSPITAL_COMMUNITY): Payer: 59

## 2012-12-27 ENCOUNTER — Ambulatory Visit (HOSPITAL_COMMUNITY): Payer: Self-pay | Admitting: Psychiatry

## 2012-12-27 ENCOUNTER — Telehealth (HOSPITAL_COMMUNITY): Payer: Self-pay

## 2012-12-31 ENCOUNTER — Emergency Department (HOSPITAL_COMMUNITY)
Admission: EM | Admit: 2012-12-31 | Discharge: 2013-01-01 | Disposition: A | Discharge status: Other Acute Inpatient Hospital | Payer: 59 | Attending: Emergency Medicine | Admitting: Emergency Medicine

## 2012-12-31 DIAGNOSIS — F911 Conduct disorder, childhood-onset type: Secondary | ICD-10-CM | POA: Insufficient documentation

## 2012-12-31 DIAGNOSIS — F29 Unspecified psychosis not due to a substance or known physiological condition: Secondary | ICD-10-CM

## 2012-12-31 DIAGNOSIS — Z79899 Other long term (current) drug therapy: Secondary | ICD-10-CM | POA: Insufficient documentation

## 2012-12-31 DIAGNOSIS — Z0289 Encounter for other administrative examinations: Secondary | ICD-10-CM | POA: Insufficient documentation

## 2012-12-31 LAB — COMPREHENSIVE METABOLIC PANEL
ALT: 13 U/L (ref 0–35)
AST: 22 U/L (ref 0–37)
CO2: 25 mEq/L (ref 19–32)
Calcium: 9.9 mg/dL (ref 8.4–10.5)
Chloride: 100 mEq/L (ref 96–112)
Creatinine, Ser: 0.88 mg/dL (ref 0.50–1.10)
GFR calc Af Amer: 89 mL/min — ABNORMAL LOW (ref >90)
GFR calc non Af Amer: 76 mL/min — ABNORMAL LOW (ref >90)
Glucose, Bld: 85 mg/dL (ref 70–99)
Total Bilirubin: 0.5 mg/dL (ref 0.3–1.2)

## 2012-12-31 LAB — CBC
HCT: 34.4 % — ABNORMAL LOW (ref 36.0–46.0)
Hemoglobin: 11.8 g/dL — ABNORMAL LOW (ref 12.0–15.0)
MCH: 27.6 pg (ref 26.0–34.0)
MCV: 80.4 fL (ref 78.0–100.0)
Platelets: 316 10*3/uL (ref 150–400)
RBC: 4.28 MIL/uL (ref 3.87–5.11)
WBC: 9.8 10*3/uL (ref 4.0–10.5)

## 2012-12-31 LAB — RAPID URINE DRUG SCREEN, HOSP PERFORMED
Amphetamines: NOT DETECTED
Opiates: NOT DETECTED
Tetrahydrocannabinol: NOT DETECTED

## 2012-12-31 LAB — SALICYLATE LEVEL: Salicylate Lvl: <2 mg/dL — ABNORMAL LOW (ref 2.8–20.0)

## 2012-12-31 MED ORDER — LORAZEPAM 2 MG/ML IJ SOLN
2.0000 mg | Freq: Once | INTRAMUSCULAR | Status: AC
  Administered 2013-01-01: 2 mg via INTRAMUSCULAR
  Filled 2012-12-31: qty 1

## 2012-12-31 MED ORDER — SERTRALINE HCL 50 MG PO TABS
100.0000 mg | ORAL_TABLET | Freq: Every day | ORAL | Status: DC
  Filled 2012-12-31: qty 2

## 2012-12-31 MED ORDER — LAMOTRIGINE 25 MG PO TABS
25.0000 mg | ORAL_TABLET | Freq: Every day | ORAL | Status: DC
  Administered 2012-12-31: 25 mg via ORAL
  Filled 2012-12-31 (×2): qty 1

## 2012-12-31 MED ORDER — LORAZEPAM 1 MG PO TABS: 1.0000 mg | ORAL_TABLET | ORAL | Status: DC | PRN

## 2012-12-31 MED ORDER — ZIPRASIDONE MESYLATE 20 MG IM SOLR
20.0000 mg | Freq: Once | INTRAMUSCULAR | Status: AC
  Administered 2012-12-31: 20 mg via INTRAMUSCULAR
  Filled 2012-12-31: qty 20

## 2012-12-31 MED ORDER — ZIPRASIDONE MESYLATE 20 MG IM SOLR
10.0000 mg | Freq: Once | INTRAMUSCULAR | Status: AC
  Administered 2013-01-01: 10 mg via INTRAMUSCULAR
  Filled 2012-12-31: qty 20

## 2012-12-31 NOTE — ED Provider Notes (Signed)
History     CSN: [id number]  Arrival date & time 12/31/12  1478   First MD Initiated Contact with Patient 12/31/12 1918      Chief Complaint  Patient presents with  . Medical Clearance    (Consider location/radiation/quality/duration/timing/severity/associated sxs/prior treatment) HPI Comments: Patient brought into the ER by police. Patient was reportedly knocking on her neighbors doors, screaming and very agitated. This was reportedly screaming about the devil. Arrival to the ER, patient is in handcuffs, screaming nonstop. She'll not answer any questions or cooperate in any way.   Past Medical History  Diagnosis Date  . No pertinent past medical history   . Medical history non-contributory     Past Surgical History  Procedure Laterality Date  . No past surgeries      Family History  Problem Relation Age of Onset  . Depression Sister   . Depression Brother   . Bipolar disorder Cousin   . Depression Sister     History  Substance Use Topics  . Smoking status: Never Smoker   . Smokeless tobacco: Never Used  . Alcohol Use: No    OB History   Grav Para Term Preterm Abortions TAB SAB Ect Mult Living                  Review of Systems  Unable to perform ROS: Psychiatric disorder    Allergies  Review of patient's allergies indicates no known allergies.  Home Medications   Current Outpatient Rx  Name  Route  Sig  Dispense  Refill  . lamoTRIgine (LAMICTAL) 25 MG tablet   Oral   Take 1 tablet (25 mg total) by mouth daily.   30 tablet   0   . LORazepam (ATIVAN) 0.5 MG tablet      TAKE 1 TABLET TWICE A DAY   60 tablet   0   . sertraline (ZOLOFT) 100 MG tablet   Oral   Take 1 tablet (100 mg total) by mouth daily.   30 tablet   1     There were no vitals taken for this visit.  Physical Exam  Constitutional: She is oriented to person, place, and time. She appears well-developed and well-nourished. She appears distressed.  HENT:  Head:  Normocephalic and atraumatic.  Right Ear: Hearing normal.  Left Ear: Hearing normal.  Nose: Nose normal.  Mouth/Throat: Oropharynx is clear and moist and mucous membranes are normal.  Eyes: Conjunctivae and EOM are normal. Pupils are equal, round, and reactive to light.  Neck: Normal range of motion. Neck supple.  Cardiovascular: Regular rhythm, S1 normal and S2 normal.  Exam reveals no gallop and no friction rub.   No murmur heard. Pulmonary/Chest: Effort normal and breath sounds normal. No respiratory distress. She exhibits no tenderness.  Abdominal: Soft. Normal appearance and bowel sounds are normal. There is no hepatosplenomegaly. There is no tenderness. There is no rebound, no guarding, no tenderness at McBurney's point and negative Murphy's sign. No hernia.  Musculoskeletal: Normal range of motion.  Neurological: She is alert and oriented to person, place, and time. She has normal strength. No cranial nerve deficit or sensory deficit. Coordination normal. GCS eye subscore is 4. GCS verbal subscore is 5. GCS motor subscore is 6.  Skin: Skin is warm, dry and intact. No rash noted. No cyanosis.  Psychiatric: Her affect is angry. Her speech is rapid and/or pressured. She is agitated and aggressive. Thought content is paranoid. Cognition and memory are impaired.  ED Course  Procedures (including critical care time)  Labs Reviewed  CBC - Abnormal; Notable for the following:    Hemoglobin 11.8 (*)    HCT 34.4 (*)    All other components within normal limits  COMPREHENSIVE METABOLIC PANEL - Abnormal; Notable for the following:    GFR calc non Af Amer 76 (*)    GFR calc Af Amer 89 (*)    All other components within normal limits  SALICYLATE LEVEL - Abnormal; Notable for the following:    Salicylate Lvl <2.0 (*)    All other components within normal limits  ACETAMINOPHEN LEVEL  ETHANOL  URINE RAPID DRUG SCREEN (HOSP PERFORMED)  POCT PREGNANCY, URINE   No results  found.   Diagnosis: Agitation, likely psychosis    MDM  Patient presents to the ER in the custody of police. Patient has been agitated, banging on neighbors to nurse. She has been assessed with bilateral and is delusional as well as paranoid currently. She was not cooperative with the exam, required Geodon injection just to be able to draw blood. Patient's sister has called in and spoke to the nurse. Patient has been exhibiting these symptoms for quite some time. She has a recent admission to [place] for similar behavior. Patient's blood work was unremarkable, she is medically cleared for psychiatric evaluation.        Gilda Crease, MD 12/31/12 [phone]

## 2012-12-31 NOTE — ED Notes (Signed)
Pt still verbally aggressive and unwilling to provide a blood or urine sample.  [employer] still at bedside.

## 2012-12-31 NOTE — ED Notes (Signed)
Patient refused lab work at this time. RN made aware. 

## 2012-12-31 NOTE — ED Notes (Signed)
Pt sleeping. [employer] at bedside. 
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
## 2012-12-31 NOTE — ED Notes (Signed)
Pt wanded by security and belongings also wanded.

## 2012-12-31 NOTE — ED Notes (Signed)
Pt's Sister: [phone]: Birdie Riddle

## 2012-12-31 NOTE — ED Notes (Signed)
Patient still refusing lab work. RN made aware.

## 2012-12-31 NOTE — ED Notes (Signed)
Brought in by [employer]. Neighbors called because patient was knocking on door and was talking uncontrollably. Patient does not follow comments, patient is sceaming and pacing- uncooperative. Patient cursing. [employer] present in room. Patient starting to scream.

## 2012-12-31 NOTE — ED Notes (Signed)
Pt speaking loudly and uncontrollably.  Pt unable to be soothed verbally.  [employer] at bedside.

## 2013-01-01 ENCOUNTER — Encounter (HOSPITAL_COMMUNITY): Payer: Self-pay | Admitting: *Deleted

## 2013-01-01 ENCOUNTER — Inpatient Hospital Stay (HOSPITAL_COMMUNITY)
Admission: AD | Admit: 2013-01-01 | Discharge: 2013-01-05 | DRG: 885 | Disposition: A | Discharge status: Home / Self Care | Payer: 59 | Source: Intra-hospital | Attending: Psychiatry | Admitting: Psychiatry

## 2013-01-01 DIAGNOSIS — F411 Generalized anxiety disorder: Secondary | ICD-10-CM | POA: Diagnosis present

## 2013-01-01 DIAGNOSIS — F3110 Bipolar disorder, current episode manic without psychotic features, unspecified: Principal | ICD-10-CM | POA: Diagnosis present

## 2013-01-01 DIAGNOSIS — F3170 Bipolar disorder, currently in remission, most recent episode unspecified: Secondary | ICD-10-CM

## 2013-01-01 DIAGNOSIS — Z79899 Other long term (current) drug therapy: Secondary | ICD-10-CM

## 2013-01-01 DIAGNOSIS — F319 Bipolar disorder, unspecified: Secondary | ICD-10-CM

## 2013-01-01 DIAGNOSIS — F29 Unspecified psychosis not due to a substance or known physiological condition: Secondary | ICD-10-CM

## 2013-01-01 DIAGNOSIS — F329 Major depressive disorder, single episode, unspecified: Secondary | ICD-10-CM

## 2013-01-01 HISTORY — DX: Mental disorder, not otherwise specified: F99

## 2013-01-01 MED ORDER — ACETAMINOPHEN 325 MG PO TABS
650.0000 mg | ORAL_TABLET | Freq: Four times a day (QID) | ORAL | Status: DC | PRN
Start: 2013-01-01 — End: 2013-01-06

## 2013-01-01 MED ORDER — MAGNESIUM HYDROXIDE 400 MG/5ML PO SUSP: 30.0000 mL | Freq: Every day | ORAL | Status: DC | PRN

## 2013-01-01 MED ORDER — ALUM & MAG HYDROXIDE-SIMETH 200-200-20 MG/5ML PO SUSP: 30.0000 mL | ORAL | Status: DC | PRN

## 2013-01-01 MED ORDER — SERTRALINE HCL 100 MG PO TABS
100.0000 mg | ORAL_TABLET | Freq: Every day | ORAL | Status: DC
  Administered 2013-01-02 – 2013-01-03 (×2): 100 mg via ORAL
  Filled 2013-01-01 (×4): qty 1

## 2013-01-01 MED ORDER — HYDROXYZINE HCL 25 MG PO TABS: 25.0000 mg | ORAL_TABLET | Freq: Every evening | ORAL | Status: DC | PRN

## 2013-01-01 MED ORDER — LAMOTRIGINE 25 MG PO TABS
25.0000 mg | ORAL_TABLET | Freq: Every day | ORAL | Status: DC
  Administered 2013-01-02: 25 mg via ORAL
  Filled 2013-01-01 (×2): qty 1

## 2013-01-01 MED ORDER — LORAZEPAM 1 MG PO TABS
1.0000 mg | ORAL_TABLET | ORAL | Status: DC | PRN
  Administered 2013-01-02 – 2013-01-03 (×2): 1 mg via ORAL
  Filled 2013-01-01 (×2): qty 1

## 2013-01-01 MED ORDER — ZIPRASIDONE MESYLATE 20 MG IM SOLR: 10.0000 mg | Freq: Two times a day (BID) | INTRAMUSCULAR | Status: DC

## 2013-01-01 NOTE — ED Notes (Signed)
EDP Plunkett said it's ok to give pt the orders for 10 geodon IM and Ativan 2mg  they are still valid orders.

## 2013-01-01 NOTE — BHH Counselor (Signed)
Magistrate Neale Burly confirmed receipt of faxed IVC paperwork. Originals placed in IVC log and copies placed in pt's chart.  Evette Cristal, LCSWA Assessment Counselor,

## 2013-01-01 NOTE — Tx Team (Signed)
Initial Interdisciplinary Treatment Plan  PATIENT STRENGTHS: (choose at least two) Active sense of humor Average or above average intelligence Capable of independent living Communication skills Financial means Physical Health Supportive family/friends Work skills  PATIENT STRESSORS: Marital or family conflict Medication change or noncompliance   PROBLEM LIST: Problem List/Patient Goals Date to be addressed Date deferred Reason deferred Estimated date of resolution  Behavior stabilization 01-01-13   discharge  Medication stabilization 01-01-13   discharge  Family conflict 01-01-13                                          DISCHARGE CRITERIA:  Ability to meet basic life and health needs Improved stabilization in mood, thinking, and/or behavior Motivation to continue treatment in a less acute level of care Need for constant or close observation no longer present Verbal commitment to aftercare and medication compliance  PRELIMINARY DISCHARGE PLAN: Attend aftercare/continuing care group Return to previous living arrangement Return to previous work or school arrangements  PATIENT/FAMIILY INVOLVEMENT: This treatment plan has been presented to and reviewed with the patient, Yvonne [name].  The patient and family have been given the opportunity to ask questions and make suggestions.  Cranford Mon 01/01/2013, 5:35 PM

## 2013-01-01 NOTE — BH Assessment (Signed)
Assessment Note   Yvonne [name] is an 47 y.o. female. Pt presented voluntarily to [facility] via [employer] but is now under IVC. Per ED notes, neighbors called [employer] when pt was knocking incessantly on their door and talking nonstop. In ED, pt was screaming and pacing and refusing to allow blood draw. Pt had been admitted to [facility] 12/21/12 with unknown d/c date. She was inpt at [facility] Dec 2013 for major depressive disorder. Pt then attended [facility] for several sessions.  UDS was negative and BAL < 11. Pt angry and irritable during assessment. Pt poor historian as she adamantly refuses to answer writer's questions. She says, "No, I'm not going to harm myself or anyone else. I'm tired of y'all asking these questions". Pt hyperverbal. She tells long, detailed story of how she was discharged from [facility] 12/30/12 and her friend "had a bipolar meltdown" and ended up leaving her in [place] rather than taking her to her home in [place]. Pt says eventually she was picked up by [employer] at her home and taken to [facility]. She says [facility] sent her to [facility]. Pt says she will sue [employer] and is calling local news station to complain. Pt currently on short term disability from [employer] where she has worked for past 17 yrs. Pt lives with her 6 yo son who has been dx with bipolar. She reports that her family is "against her" and thinks she should stay in hospital. Pt sees Dr. Lolly Mustache for med management.   Axis I: Major Depressive Disorder, Recurrent, Severe without Psychotic Features Axis II: Deferred Axis III:  Past Medical History  Diagnosis Date  . No pertinent past medical history   . Medical history non-contributory    Axis IV: other psychosocial or environmental problems, problems related to social environment and problems with primary support group Axis V: 31-40 impairment in reality testing  Past Medical History:  Past Medical History  Diagnosis Date  . No pertinent past medical history   .  Medical history non-contributory     Past Surgical History  Procedure Laterality Date  . No past surgeries      Family History:  Family History  Problem Relation Age of Onset  . Depression Sister   . Depression Brother   . Bipolar disorder Cousin   . Depression Sister     Social History:  reports that she has never smoked. She has never used smokeless tobacco. She reports that she does not drink alcohol or use illicit drugs.  Additional Social History:  Alcohol / Drug Use Pain Medications: see PTA meds list Prescriptions: see PTA meds list Over the Counter: see PTA meds list History of alcohol / drug use?: No history of alcohol / drug abuse  CIWA: CIWA-Ar BP: 138/67 mmHg Pulse Rate: 96 COWS:    Allergies: No Known Allergies  Home Medications:  (Not in a hospital admission)  OB/GYN Status:  No LMP recorded. Patient is not currently having periods (Reason: Perimenopausal).  General Assessment Data Location of Assessment: [facility] Living Arrangements: Children (53 yio son) Can pt return to current living arrangement?: Yes Admission Status: Involuntary Is patient capable of signing voluntary admission?: Yes Transfer from: Acute Hospital Referral Source: Other ([employer])  Education Status Is patient currently in school?: No  Risk to self Suicidal Ideation: No Suicidal Intent: No Is patient at risk for suicide?: No Suicidal Plan?: No Access to Means:  (UTA) What has been your use of drugs/alcohol within the last 12  months?: unable to assess - UDS negative & etoh <11 Previous Attempts/Gestures: No How many times?: 0 Other Self Harm Risks: none Triggers for Past Attempts:  (none) Intentional Self Injurious Behavior: None Family Suicide History: Unable to assess Recent stressful life event(s): Other (Comment) (family thinks she is crazy, son destroyed furniture when ang) Persecutory voices/beliefs?: Yes Depression:  (UTA) Depression Symptoms: Feeling  angry/irritable Substance abuse history and/or treatment for substance abuse?:  (UTA) Suicide prevention information given to non-admitted patients: Not applicable  Risk to Others Homicidal Ideation: No Thoughts of Harm to Others: No Current Homicidal Intent: No Current Homicidal Plan: No Access to Homicidal Means:  (UTA) Identified Victim: none History of harm to others?:  (UTA) Assessment of Violence: None Noted Violent Behavior Description: pt denies hx of violence Does patient have access to weapons?:  (UTA) Criminal Charges Pending?:  (UTA) Does patient have a court date:  (UTA)  Psychosis Hallucinations:  (UTA) Delusions: None noted  Mental Status Report Appear/Hygiene: Disheveled Eye Contact: Good Motor Activity: Gestures;Freedom of movement;Hyperactivity Speech: Logical/coherent;Loud;Rapid Level of Consciousness: Alert;Irritable Mood:  (UTA) Affect: Angry;Anxious;Irritable;Appropriate to circumstance Anxiety Level:  (UTA) Thought Processes: Coherent;Relevant;Circumstantial Judgement: Unimpaired Orientation: Person;Place;Time;Situation Obsessive Compulsive Thoughts/Behaviors: None  Cognitive Functioning Concentration:  (UTA) Memory: Remote Intact;Recent Intact IQ: Average Insight: Poor Impulse Control: Poor Appetite: Good Weight Loss: 0 Weight Gain: 0 Sleep: No Change Total Hours of Sleep: 4 Vegetative Symptoms: None  ADLScreening [facility] Assessment Services) Patient's cognitive ability adequate to safely complete daily activities?: Yes Patient able to express need for assistance with ADLs?: Yes Independently performs ADLs?: Yes (appropriate for developmental age)  Abuse/Neglect [facility]) Physical Abuse:  (unable to assess) Verbal Abuse:  (unable to assess) Sexual Abuse:  (unable to assess)  Prior Inpatient Therapy Prior Inpatient Therapy: Yes Prior Therapy Dates: Dec 2013 / June 2014 Prior Therapy Facilty/Provider([facility]): [facility] Reason for  Treatment: depression  Prior Outpatient Therapy Prior Outpatient Therapy: Yes Prior Therapy Dates: currently Prior Therapy Facilty/Provider(s): Dr. Lolly Mustache & [facility] Reason for Treatment: depression  ADL Screening (condition at time of admission) Patient's cognitive ability adequate to safely complete daily activities?: Yes Patient able to express need for assistance with ADLs?: Yes Independently performs ADLs?: Yes (appropriate for developmental age) Weakness of Legs: None Weakness of Arms/Hands: None  Home Assistive Devices/Equipment Home Assistive Devices/Equipment: None    Abuse/Neglect Assessment (Assessment to be complete while patient is alone) Physical Abuse:  (unable to assess) Verbal Abuse:  (unable to assess) Sexual Abuse:  (unable to assess) Exploitation of patient/patient's resources: Denies Self-Neglect: Denies Values / Beliefs Cultural Requests During Hospitalization: None Spiritual Requests During Hospitalization: None   Advance Directives (For Healthcare) Advance Directive: Patient does not have advance directive    Additional Information 1:1 In Past 12 Months?: No CIRT Risk: Yes Elopement Risk: Yes Does patient have medical clearance?: Yes     Disposition:  Disposition Initial Assessment Completed for this Encounter: Yes Disposition of Patient: Inpatient treatment program;Outpatient treatment Type of inpatient treatment program: Adult Type of outpatient treatment: Psych Intensive Outpatient  On Site Evaluation by:   Reviewed with Physician:     Thornell Sartorius 01/01/2013 6:34 AM

## 2013-01-01 NOTE — Progress Notes (Signed)
[facility] ED CM noted no pcp ans saw pt this am but pt began to speak in fast sentences about Dr Lolly Mustache and not speaking with her family CM informed nursing staff Pt walked out of room with CM to nursing station

## 2013-01-01 NOTE — ED Notes (Signed)
This writer attempted to provide breakfast to the pt. Pt refused her breakfast, stating, "Just take it out of here. I don't want none of y'all's stuff."

## 2013-01-01 NOTE — BHH Counselor (Signed)
Patient accepted to [facility] by Julieanne Cotton, NP/Dr. Lolly Mustache to [name]. The room assignment is 406-1. The nursing call report # is [phone]. Patient is under IVC and will be transported to [facility] via sheriff. EDP-Dr. Anitra Lauth made aware of patient's disposition.

## 2013-01-01 NOTE — BHH Counselor (Signed)
Patient accepted to [facility] by Dr. Lolly Mustache pending a 400 hall do not admit room.  Pt referred to [facility]; declined by Dr. Hardie Pulley due to acuity.

## 2013-01-01 NOTE — Progress Notes (Signed)
Patient ID: Yvonne [name], female   DOB: 1966-02-07, 47 y.o.   MRN: [id number] Subjective:   Saw patient this am with Dr Lolly Mustache and was not able to finish interviewing her.  Patient was psychotic, angry and rambled her answers.  She was tangential and her speech was pressured accusing staff and police of abusing her by committing her and giving her injections.  She believes nobody understands her stressors and family dynamics and  that nobody want to help her.  Patient did not answer most questions but veered off to different  topic.  Patient stated repeated that she will not take any antipsychotic because she did not need it.  Patient reports she preferred her pastor to come and talk to her and get her out of the hospital.  At this point, we will admit her to our inpatient unit for safety and stabilization.  We will continue treating her with Geodon IM until she can cooperate with staff and  accept po antipsychotic medications.   Plan/Recommendation:  Geodon 10 mg IM bid. Continue the rest  po medications. Goal is to get her back to preadmission functioning and to comply with her antipsychotics Yvonne [name]  PMHNP-BC. I personally seen the patient agreed with the findings and involved in the treatment plan.

## 2013-01-02 ENCOUNTER — Encounter (HOSPITAL_COMMUNITY): Payer: Self-pay | Admitting: Psychiatry

## 2013-01-02 DIAGNOSIS — F3110 Bipolar disorder, current episode manic without psychotic features, unspecified: Principal | ICD-10-CM

## 2013-01-02 DIAGNOSIS — F332 Major depressive disorder, recurrent severe without psychotic features: Secondary | ICD-10-CM

## 2013-01-02 DIAGNOSIS — F3170 Bipolar disorder, currently in remission, most recent episode unspecified: Secondary | ICD-10-CM

## 2013-01-02 MED ORDER — ADULT MULTIVITAMIN W/MINERALS CH
1.0000 | ORAL_TABLET | Freq: Every day | ORAL | Status: DC
  Administered 2013-01-02 – 2013-01-05 (×4): 1 via ORAL
  Filled 2013-01-02 (×7): qty 1

## 2013-01-02 MED ORDER — LAMOTRIGINE 25 MG PO TABS
25.0000 mg | ORAL_TABLET | Freq: Every day | ORAL | Status: DC
  Administered 2013-01-03: 25 mg via ORAL
  Filled 2013-01-02 (×3): qty 1

## 2013-01-02 MED ORDER — OLANZAPINE 10 MG PO TBDP: 10.0000 mg | ORAL_TABLET | Freq: Three times a day (TID) | ORAL | Status: DC | PRN

## 2013-01-02 NOTE — Tx Team (Signed)
Interdisciplinary Treatment Plan Update (Adult)  Date: 01/02/2013  Time Reviewed:  9:45 AM  Progress in Treatment: Attending groups: Yes Participating in groups:  Yes Taking medication as prescribed:  Yes Tolerating medication:  Yes Family/Significant othe contact made: CSW assessing Patient understands diagnosis:  Yes Discussing patient identified problems/goals with staff:  Yes Medical problems stabilized or resolved:  Yes Denies suicidal/homicidal ideation: Yes Issues/concerns per patient self-inventory:  Yes Other:  New problem(s) identified: N/A  Discharge Plan or Barriers:  Pt assessing for appropriate referrals for medication management and therapy.    Reason for Continuation of Hospitalization:  Depression Anxiety Agitation  Comments: N/A  Estimated length of stay: 3-5 days  For review of initial/current patient goals, please see plan of care.  Attendees: Patient:  Yvonne [name]  01/02/2013 9:38 AM   Family:     Physician:  Dr. Thedore Mins 01/02/2013 9:38 AM   Nursing:   Celso Sickle, RN 01/02/2013 9:38 AM   Clinical Social Worker:  Reyes Ivan, LCSWA 01/02/2013 9:38 AM   Other: Liborio Nixon, RN 01/02/2013 9:38 AM   Other:  Verne Spurr, PA 01/02/2013 9:39 AM   Other:     Other:     Other:    Other:    Other:    Other:    Other:    Other:     Scribe for Treatment Team:   Carmina Miller, 01/02/2013 9:38 AM

## 2013-01-02 NOTE — BHH Group Notes (Signed)
BHH LCSW Group Therapy  01/02/2013  1:15 PM   Type of Therapy:  Group Therapy  Participation Level:  Minimal  Participation Quality:  Monopolizing and then minimal  Affect:  Agitated, irritable  Cognitive:  Alert  Insight:  Poor  Engagement in Therapy:  Lacking  Modes of Intervention:  Activity, Clarification, Confrontation, Discussion, Education, Exploration, Limit-setting, Orientation, Problem-solving, Rapport Building, Dance movement psychotherapist, Socialization and Support  Summary of Progress/Problems: The topic for group therapy was feelings about diagnosis.  Pt actively participated in group discussion on their past and current diagnosis and how they feel towards this.  Pt also identified how society and family members judge them, based on their diagnosis as well as stereotypes and stigmas.   Before CSW could start group, pt immediately jumped into her story of how she is not supposed to be here and the poor treatment she has gotten while here.  Pt again threatened to sue everyone and that she has [phone] lawyers on her side and that we could google her website.  Pt states that CSW has been the only one kind to her.  CSW then begin to review group rules and pt was then triggered by the rule of respect.  Pt states that she knows staff told CSW to have this talk with her in group after the incident at lunch where pt's voice was supposedly too loud.  CSW assured pt that CSW did not know what she was speaking of and that this is standard for CSW to review group rules.  Pt continued to escalate, confronting other patients and CSW verbally.  CSW asked pt to either sit quietly in group and participate, sit silently or leave if she could not do this.  Pt stated that CSW could not make her leave group.  CSW stated that if CSW had to warn pt again she would be asked to leave.  Pt sat silently looking out the window for the remainder of group and did not say anything further on the group topic or being upset.  Pt  has poor insight on what brought her to the hospital and her current behaviors.    Yvonne [name], LCSWA 01/02/2013 2:54 PM

## 2013-01-02 NOTE — BHH Counselor (Signed)
Adult Psychosocial Assessment Update Interdisciplinary Team  Previous Behavior Health Hospital admissions/discharges:  Admissions Discharges  Date: 06/20/12 Date: 07/03/12  Date: Date:  Date: Date:  Date: Date:  Date: Date:   Changes since the last Psychosocial Assessment (including adherence to outpatient mental health and/or substance abuse treatment, situational issues contributing to decompensation and/or relapse). Pt states that she was admitted here for not agreeing to take Risperdal.  Pt states that she was recently hospitalized at [facility] and discharged over the weekend, and was picked up by the police and brought here.  Pt states that her family gets her put in the hospital and "messes with her life".  Pt is threatening to sue everyone for her treatment.  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  Pt has seen Dr. Lolly Mustache regularly for follow up since last admission.               Discharge Plan 1. Will you be returning to the same living situation after discharge?   Yes: X No:      If no, what is your plan?           2. Would you like a referral for services when you are discharged? Yes: X    If yes, for what services? Pt sees Dr. Lolly Mustache for medication management.  CSW will assess if pt can follow up there.    No:              Summary and Recommendations (to be completed by the evaluator) Patient is a 47 year old African American Female with a diagnosis of Major Depressive Disorder without psychotic features.  Patient lives in [place] with her adult son.  Patient will benefit from crisis stabilization, medication evaluation, group therapy and psycho education in addition to case management for discharge planning.                         Signature:  Carmina Miller, 01/02/2013 8:30 AM

## 2013-01-02 NOTE — [facility] Suicide Risk Assessment (Signed)
Suicide Risk Assessment  Admission Assessment     Nursing information obtained from:    Demographic factors:    Current Mental Status:    Loss Factors:    Historical Factors:    Risk Reduction Factors:     CLINICAL FACTORS:   Severe Anxiety and/or Agitation Bipolar Disorder:   Bipolar II Previous Psychiatric Diagnoses and Treatments  COGNITIVE FEATURES THAT CONTRIBUTE TO RISK:  Closed-mindedness    SUICIDE RISK:   Minimal: No identifiable suicidal ideation.  Patients presenting with no risk factors but with morbid ruminations; may be classified as minimal risk based on the severity of the depressive symptoms  PLAN OF CARE:1. Admit for crisis management and stabilization. 2. Medication management to reduce current symptoms to base line and improve the     patient's overall level of functioning 3. Treat health problems as indicated. 4. Develop treatment plan to decrease risk of relapse upon discharge and the need for     readmission. 5. Psycho-social education regarding relapse prevention and self care. 6. Health care follow up as needed for medical problems. 7. Restart home medications where appropriate.   I certify that inpatient services furnished can reasonably be expected to improve the patient's condition.  Thedore Mins, MD 01/02/2013, 11:01 AM

## 2013-01-02 NOTE — Progress Notes (Signed)
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  Pt identified as at risk on the Malnutrition Screen Tool  INTERVENTION: 1. Educated patient on the importance of nutrition and encouraged intake of food and beverages. 2. Discussed weight goals. 3. Supplements: MVI daily  NUTRITION DIAGNOSIS: Unintentional weight loss related to sub-optimal intake as evidenced by pt report.   Goal: Pt to meet >/= 90% of their estimated nutrition needs.  Monitor:  PO intake  Assessment:  Patient admitted with depression.  Reports eating well currently and expresses increased anger and frustration related to her admit.  D/C'ed on the day of her admit here from [facility].  "Didn't eat well because of the games and manipulation."  Remains angry.  Reports decreased appetite related to anxiety.    47 y.o. female  Height: Ht Readings from Last 1 Encounters:  01/01/13 5\' 5"  (1.651 m)    Weight: Wt Readings from Last 1 Encounters:  01/01/13 122 lb (55.339 kg)    Weight Hx: Wt Readings from Last 10 Encounters:  01/01/13 122 lb (55.339 kg)  12/04/12 129 lb (58.514 kg)  11/29/12 129 lb (58.514 kg)  10/18/12 129 lb (58.514 kg)  08/31/12 136 lb 12.8 oz (62.052 kg)  08/17/12 137 lb (62.143 kg)  08/02/12 135 lb 9.6 oz (61.508 kg)  07/11/12 135 lb 6.4 oz (61.417 kg)  06/20/12 140 lb (63.504 kg)  11/01/11 145 lb 6.4 oz (65.953 kg)    BMI:  Body mass index is 20.3 kg/(m^2). Pt meets criteria for wnl based on current BMI.  Estimated Nutritional Needs: Kcal: 25-30 kcal/kg Protein: > 1 gram protein/kg Fluid: 1 ml/kcal  Diet Order: General Pt is also offered choice of unit snacks mid-morning and mid-afternoon.  Pt is eating as desired.   Lab results and medications reviewed.   Oran Rein, RD, LDN Clinical Inpatient Dietitian Pager:   [phone] Weekend and after hours pager:  [phone]

## 2013-01-02 NOTE — [facility] Group Notes (Addendum)
[facility] LCSW Aftercare Discharge Planning Group Note   01/02/2013 8:45 AM  Participation Quality:  Alert and Appropriate   Mood/Affect:  Agitated  Depression Rating:  Pt denies  Anxiety Rating:  Pt denies  Thoughts of Suicide:  Pt denies SI/HI  Will you contract for safety?   Yes  Current AVH:  Pt denies AVH  Plan for Discharge/Comments:  Pt attended discharge planning group and actively participated in group.  CSW provided pt with today's workbook. Pt states that she came to the hospital because she refused to take Risperdal.  Pt states that she sees Dr. Lolly Mustache for medication management and is open to returning back to him, but then states that she needs to have a talk with him about this admission.  Pt states that she lives in [place].  CSW will assess for appropriate referrals.  Pt has pressured and rapid speech and is easily irritated.  No further needs voiced by pt at this time.     Transportation Means: Pt reports having access to transportation  Supports: No supports mentioned at this time  Reyes Ivan, LCSWA 01/02/2013 9:41 AM

## 2013-01-02 NOTE — Progress Notes (Signed)
D: Pt denies SI/HI/AVH. Pt presents with rapid pressured speech, brief eye contact and impulsive behaviors. Pt has a flight of ideas and is labile during conversation. Pt has no insight for treatment and blames everyone else. Pt threaten to sue multiple disciplines for mistreating her. Pt presents with delusional thoughts, stating, "I have a team of lawyers that stand behind me, check out my website and you will see". A: Medications administered as ordered per MD. Medications reviewed with treatment team this morning per MD. Verbal support given. Pt encouraged to attend groups.  15 minute checks performed for safety. R: safety maintained. Pt refuses to take Risperdal and blames Dr. Lolly Mustache for trying to start her a medication that is advertised on tv. Pt easily agitated during treatment team.
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
## 2013-01-03 DIAGNOSIS — F319 Bipolar disorder, unspecified: Secondary | ICD-10-CM

## 2013-01-03 MED ORDER — SERTRALINE HCL 50 MG PO TABS
50.0000 mg | ORAL_TABLET | Freq: Every day | ORAL | Status: DC
  Administered 2013-01-04 – 2013-01-05 (×2): 50 mg via ORAL
  Filled 2013-01-03 (×2): qty 3
  Filled 2013-01-03 (×3): qty 1
  Filled 2013-01-03: qty 3

## 2013-01-03 MED ORDER — LAMOTRIGINE 25 MG PO TABS
50.0000 mg | ORAL_TABLET | Freq: Every day | ORAL | Status: DC
  Administered 2013-01-04 – 2013-01-05 (×2): 50 mg via ORAL
  Filled 2013-01-03: qty 2
  Filled 2013-01-03: qty 6
  Filled 2013-01-03: qty 2
  Filled 2013-01-03: qty 6
  Filled 2013-01-03: qty 2
  Filled 2013-01-03: qty 6

## 2013-01-03 MED ORDER — LORAZEPAM 0.5 MG PO TABS
0.5000 mg | ORAL_TABLET | Freq: Two times a day (BID) | ORAL | Status: DC
  Administered 2013-01-03 – 2013-01-05 (×4): 0.5 mg via ORAL
  Filled 2013-01-03 (×4): qty 1
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
## 2013-01-03 NOTE — Progress Notes (Signed)
  D: Pt was laying in bed during the assessment. Pt voiced concerns about her Dr and hospital staff. "They don't realize they're playing with my life." However, pt stated she was having a good day until she met with her Dr. Charline Bills she doesn't know why "they're trying to get her to take risperdal". Stated, "I've never taken risperdal before". States she's more upset with her family for putting her in here.   A:  Support and encouragement was offered. 15 min checks continued for safety.  R: Pt remains safe.

## 2013-01-03 NOTE — Progress Notes (Signed)
Adult Psychoeducational Group Note  Date:  01/03/2013 Time:  3:16 PM  Group Topic/Focus: Communication Healthy Communication:   The focus of this group is to discuss communication, barriers to communication, as well as healthy ways to communicate with others.  Participation Level:  Active  Participation Quality:  Appropriate  Affect:  Defensive  Cognitive:  Alert  Insight: Appropriate  Engagement in Group:  Engaged  Modes of Intervention:  Exploration  Additional Comments:  Patient initially refused to participate in group. She later began to express her concerns about her experiences as a patient and how she became a patient. She believes she is here under false pretenses.   Delia Chimes 01/03/2013, 3:16 PM

## 2013-01-03 NOTE — [facility] Group Notes (Signed)
[facility] LCSW Group Therapy  01/03/2013  1:15 PM   Type of Therapy:  Group Therapy  Participation Level:  Active  Participation Quality:  Appropriate and Attentive  Affect:  Appropriate  Cognitive:  Alert and Appropriate  Insight:  Developing/Improving and Engaged  Engagement in Therapy:  Developing/Improving and Engaged  Modes of Intervention:  Activity, Clarification, Confrontation, Discussion, Education, Exploration, Limit-setting, Orientation, Problem-solving, Rapport Building, Dance movement psychotherapist, Socialization and Support  Summary of Progress/Problems: Patient was attentive and engaged with speaker from [employer].  Speaker shared his story of dealing with mental health and played guitar to engage pt.  Pt was able to relate to speaker in regards to diagnosis and mental health.  Pt was quiet but attentive to group discussion.   Alfonzo Arca Horton, LCSWA 01/03/2013 1:16 PM

## 2013-01-03 NOTE — Progress Notes (Signed)
[facility] MD Progress Note  01/03/2013 2:52 PM Yvonne [name]  MRN:  [id number] Subjective:  "Please don't upset me today!" Objective: Yvonne [name] has been loud and aggressive since her arrival making demands and threatening to sue everyone involved in her care. She continues to have rapid pressured speech, she is grandiose, threatening, with flight of ideas, circumstantiality, with mood lability.  She continues to exhibit paranoid features stating that she is feeling persecuted especially by her family.  Diagnosis:  Bipolar disorder, unspecified   ADL's:  Intact  Sleep: Fair  Appetite:  Good  Suicidal Ideation:  denies Homicidal Ideation:  Denies but states she "will defend herself if needed." AEB (as evidenced by):  Psychiatric Specialty Exam: Review of Systems  Constitutional: Negative.  Negative for fever, chills, weight loss, malaise/fatigue and diaphoresis.  HENT: Negative for congestion and sore throat.   Eyes: Negative for blurred vision, double vision and photophobia.  Respiratory: Negative for cough, shortness of breath and wheezing.   Cardiovascular: Negative for chest pain, palpitations and PND.  Gastrointestinal: Negative for heartburn, nausea, vomiting, abdominal pain, diarrhea and constipation.  Musculoskeletal: Negative for myalgias, joint pain and falls.  Neurological: Negative for dizziness, tingling, tremors, sensory change, speech change, focal weakness, seizures, loss of consciousness, weakness and headaches.  Endo/Heme/Allergies: Negative for polydipsia. Does not bruise/bleed easily.  Psychiatric/Behavioral: Negative for depression, suicidal ideas, hallucinations, memory loss and substance abuse. The patient is not nervous/anxious and does not have insomnia.     Blood pressure 104/77, pulse 98, temperature 97.9 F (36.6 C), temperature source Oral, resp. rate 20, height 5\' 5"  (1.651 m), weight 55.339 kg (122 lb).Body mass index is 20.3 kg/(m^2).  General Appearance:  Casual  Eye Contact::  Fair  Speech:  Pressured  Volume:  Increased  Mood:  Irritable  Affect:  Labile  Thought Process:  Circumstantial  Orientation:  Full (Time, Place, and Person)  Thought Content:  Paranoid Ideation  Suicidal Thoughts:  No  Homicidal Thoughts:  No  Memory:  NA  Judgement:  Impaired  Insight:  Lacking  Psychomotor Activity:  Increased  Concentration:  Fair  Recall:  Fair  Akathisia:  No  Handed:  Right  AIMS (if indicated):     Assets:  Communication Skills Desire for Improvement Housing Physical Health Social Support Talents/Skills Vocational/Educational  Sleep:  Number of Hours: 6.75   Current Medications: Current Facility-Administered Medications  Medication Dose Route Frequency Provider Last Rate Last Dose  . acetaminophen (TYLENOL) tablet 650 mg  650 mg Oral Q6H PRN Earney Navy, NP      . alum & mag hydroxide-simeth (MAALOX/MYLANTA) 200-200-20 MG/5ML suspension 30 mL  30 mL Oral Q4H PRN Earney Navy, NP      . hydrOXYzine (ATARAX/VISTARIL) tablet 25 mg  25 mg Oral QHS PRN Earney Navy, NP      . lamoTRIgine (LAMICTAL) tablet 25 mg  25 mg Oral Daily Mojeed Akintayo   25 mg at 01/03/13 0757  . LORazepam (ATIVAN) tablet 0.5 mg  0.5 mg Oral BID Verne Spurr, PA-C      . LORazepam (ATIVAN) tablet 1 mg  1 mg Oral Q4H PRN Earney Navy, NP   1 mg at 01/03/13 0850  . magnesium hydroxide (MILK OF MAGNESIA) suspension 30 mL  30 mL Oral Daily PRN Earney Navy, NP      . multivitamin with minerals tablet 1 tablet  1 tablet Oral Daily Jeoffrey Massed, RD   1 tablet at 01/03/13 0757  .  OLANZapine zydis (ZYPREXA) disintegrating tablet 10 mg  10 mg Oral Q8H PRN Mojeed Akintayo      . sertraline (ZOLOFT) tablet 100 mg  100 mg Oral Daily Earney Navy, NP   100 mg at 01/03/13 1610    Lab Results: No results found for this or any previous visit (from the past 48 hour(s)).  Physical Findings: AIMS: Facial and Oral  Movements Muscles of Facial Expression: None, normal Lips and Perioral Area: None, normal Jaw: None, normal Tongue: None, normal,Extremity Movements Upper (arms, wrists, hands, fingers): None, normal Lower (legs, knees, ankles, toes): None, normal, Trunk Movements Neck, shoulders, hips: None, normal, Overall Severity Severity of abnormal movements (highest score from questions above): None, normal Incapacitation due to abnormal movements: None, normal Patient's awareness of abnormal movements (rate only patient's report): No Awareness, Dental Status Current problems with teeth and/or dentures?: No Does patient usually wear dentures?: No  CIWA:    COWS:     Treatment Plan Summary: Daily contact with patient to assess and evaluate symptoms and progress in treatment Medication management  Plan: 1. zoloft will be decreased to 50mg  po qd. 2. Lamictal will be increased to 50mg  po qd. 3. Ativan will stay as written. 4. ELOS: 3-4 days.  Medical Decision Making Problem Points:  Established problem, worsening (2) and Review of psycho-social stressors (1) Data Points:  Review of medication regiment & side effects (2)  I certify that inpatient services furnished can reasonably be expected to improve the patient's condition.  Rona Ravens. Leean Amezcua RPAC 3:44 PM 01/03/2013

## 2013-01-03 NOTE — [facility] Group Notes (Signed)
[facility] LCSW Aftercare Discharge Planning Group Note   01/03/2013 8:45 AM  Participation Quality:  Alert and Appropriate   Mood/Affect:  Agitated  Depression Rating:  Pt denies  Anxiety Rating:  Pt denies  Thoughts of Suicide:  Pt denies SI/HI  Will you contract for safety?   Yes  Current AVH:  Pt denies AVH  Plan for Discharge/Comments:  Pt attended discharge planning group and actively participated in group.  CSW provided pt with today's workbook. Pt states that she is "wonderful" today.  Pt states that she better be out of the hospital by Thursday at 9:00 am to take care of her business.  Pt will follow up with Dr. Lolly Mustache with [facility] for medication management.   No further needs voiced by pt at this time.     Transportation Means: Pt reports having access to transportation  Supports: No supports mentioned at this time  Reyes Ivan, LCSWA 01/03/2013 9:41 AM

## 2013-01-03 NOTE — Progress Notes (Signed)
The focus of this group is to help patients review their daily goal of treatment and discuss progress on daily workbooks. Pt attended the evening group session and responded to all discussion prompts from the Writer. Pt reported having a good day, though she was very focused on being discharged. "I hope I'm discharged soon because I don't know why I'm here. I'm not mad, but I shouldn't be here." Pt said that her only goals were to live life, be happy and plan for her 49th [place] Ouch party next year because it was going to be a huge celebration. Pt did make quick, fleeting comments about suing the hospital before saying "But I'm not going to talk about that now. Everyone will read about it in the newspapers is all I'm saying." Pt's affect during group was bright and Pt smiled when speaking/being spoken to.

## 2013-01-03 NOTE — [facility] Suicide Risk Assessment (Signed)
[facility] Adult Inpatient Family/Significant Other Suicide Prevention Education  Suicide Prevention Education:   Patient Refusal for Family/Significant Other Suicide Prevention Education: The patient has refused to provide written consent for family/significant other to be provided Family/Significant Other Suicide Prevention Education during admission and/or prior to discharge.  Physician notified.  CSW provided suicide prevention information with patient.    The suicide prevention education provided includes the following:  Suicide risk factors  Suicide prevention and interventions  National Suicide Hotline telephone number  [facility] assessment telephone number  [facility] Emergency Assistance 911  [facility] and/or Residential Mobile Crisis Unit telephone number   Reyes Ivan, [place] 01/03/2013 2:49 PM

## 2013-01-03 NOTE — Progress Notes (Signed)
D: Pt denies SI/HI/AVH. Pt presents with rapid pressured speech, brief eye contact an impulsive behaviors.  Pt reports that staff in the ED and Dr. Lolly Mustache have missed treated her. Pt stated that her son is the one who needs help and that he is currently in the hosp. Pt has no insight for treatment. Pt irritable this morning an anxious. A: Medications administered as ordered per MD. PRN Ativan given for agitation. Medications adjustment per PA. Verbal support given. Pt encouraged to attend groups. 15 minute checks performed for safety. R: Pt labile and continues to threaten to sue staff. Safety maintained.

## 2013-01-04 ENCOUNTER — Ambulatory Visit (HOSPITAL_COMMUNITY): Payer: Self-pay | Admitting: Psychiatry

## 2013-01-04 MED ORDER — LAMOTRIGINE 25 MG PO TABS: 50.0000 mg | ORAL_TABLET | Freq: Every day | ORAL | Status: DC

## 2013-01-04 MED ORDER — SERTRALINE HCL 50 MG PO TABS: 50.0000 mg | ORAL_TABLET | Freq: Every day | ORAL | Status: DC

## 2013-01-04 NOTE — Progress Notes (Signed)
D: Patient appeared very angry during this assessment. She reported that she is ready to go home tomorrow and that she has to leave before noon because she has some bills to pay and mails to send out. She said her father suppose to be picking her out, but she might not come so she wants to know what time she would be discharge so she can call her friend to pick her up. A: Writer encouraged and supported patient. Told patient she would pass the informations out to the day RN.  R: Patient receptive to encouragement. Q 15 minute check continues as ordered to maintain safety

## 2013-01-04 NOTE — BHH Group Notes (Signed)
[facility] LCSW Aftercare Discharge Planning Group Note   01/03/2013 8:45 AM  Participation Quality:  Alert and Appropriate   Mood/Affect:  Appropriate and Calm  Depression Rating:  Pt denies  Anxiety Rating:  Pt denies  Thoughts of Suicide:  Pt denies SI/HI  Will you contract for safety?   Yes  Current AVH:  Pt denies AVH  Plan for Discharge/Comments:  Pt attended discharge planning group and actively participated in group.  CSW provided pt with today's workbook. Pt states that she is doing okay today but is eager to d/c today to get home and take care of her bills, as she reports her utilities in her home have been cut off since she hasn't been home to pay bills. Pt will follow up with Dr. Lolly Mustache with [facility] for medication management.   No further needs voiced by pt at this time.     Transportation Means: Pt states that family will pick her up  Supports: No supports mentioned at this time  Reyes Ivan, LCSWA 01/03/2013 9:41 AM

## 2013-01-04 NOTE — Progress Notes (Signed)
Psychoeducational Group Note  Date:  01/04/2013 Time:  2000  Group Topic/Focus:  Karaoke  Participation Level: Did Not Attend  Participation Quality:  Not Applicable  Affect:  Not Applicable  Cognitive:  Not Applicable  Insight:  Not Applicable  Engagement in Group: Not Applicable  Additional Comments:    Flonnie Hailstone 01/04/2013, 9:53 PM

## 2013-01-04 NOTE — BHH Group Notes (Signed)
[facility] LCSW Group Therapy  01/04/2013  1:15 PM   Type of Therapy:  Group Therapy  Participation Level:  Minimal  Participation Quality:  Appropriate and Attentive  Affect:  Appropriate and Agitated  Cognitive:  Alert and Appropriate  Insight:  Limited  Engagement in Therapy:  Limited, Lacking  Modes of Intervention:  Activity, Clarification, Confrontation, Discussion, Education, Exploration, Limit-setting, Orientation, Problem-solving, Rapport Building, Dance movement psychotherapist, Socialization and Support  Summary of Progress/Problems: The topic for group was balance in life.  Pt participated in the discussion about when their life was in balance and out of balance and how this feels.  Pt discussed ways to get back in balance and short term goals they can work on to get where they want to be.  Pt was given quotes related to balance and processed how they could relate to the quote and what it meant to them.  Pt states that she wanted to pass on group discussion and stated that she is worried about her house and what needs to be done outside of the hospital, regarding her bills.  Pt did not participate in group discussion but sat through group quietly and listened to group discussion.    Reyes Ivan, LCSWA 01/04/2013 2:35 PM

## 2013-01-04 NOTE — Progress Notes (Signed)
D: Pt presents calm and cooperative this evening. Pt was  preoccupied with her journey prior to arrival during our conversation. Pt spoke with this Clinical research associate for 20 mins on her experiences with her family, the police, and the ED prior to arrival. She repeatedly verbalizes that upon discharge she is suing the police and other encounters prior to arriving. She reports that she is not suppose to be here. She adamantly denies any SI/HI or AVH. She is also denying any depression, anxiety, or any other psychosocial symptoms. Pt attended group this evening. Pt observed interacting appropriately within the milieu.  A: There were no scheduled or required prn medications to be administered to this pt. Continued support and availability as needed was extended to this pt.  Staff continue to monitor pt with q54min checks.  R: No adverse drug reactions noted from previous medication administrations. Pt receptive to treatment. Pt remains safe at this time.

## 2013-01-04 NOTE — Progress Notes (Signed)
D:  Patient up and active in the milieu today.  Has been attending and participating in groups.  Patient is fixated on being discharged today stating that her lights, phone, and water have been shut off and she does not know if her doors have been padlocked by the [employer]  Loud at times, but redirectable.  She denies depression, hopelessness, or anxiety.  States sleep and appetite are good.  Denies thoughts of self harm.  A:  Medications as per schedule.  Encouraged participation in the milieu.  Discussed case with Lloyd Huger, [place], who has told patient that she will be staying here for another day.   R:  Loud at times, focussed on leaving, but can be redirected.  Tolerating medications well.

## 2013-01-04 NOTE — Progress Notes (Signed)
Adult Psychoeducational Group Note  Date:  01/04/2013 Time:  11:04 AM  Group Topic/Focus:  Relapse Prevention Planning:   The focus of this group is to define relapse and discuss the need for planning to combat relapse. Self Esteem Action Plan:   The focus of this group is to help patients create a plan to continue to build self-esteem after discharge.  Participation Level:  Minimal  Participation Quality:  Inattentive  Affect:  Angry and Anxious  Cognitive:  Appropriate  Insight: Lacking and Limited  Engagement in Group:  Defensive and Off Topic  Modes of Intervention:  Activity, Discussion, Socialization and Support  Additional Comments:  Pt came to group, but did not speak until the every end. Pt refused to fill-out the activity. Towards the end of group pt got off topic and had to be redirected and this writer continued the discussion after group. This group focused on self-esteem and had an activity for making an action plan to identify obstacles and triggers for building positive self-esteem. The patient discussed and thought about patterns, activities, support and other means for increasing self-esteem and making short and long term goals for self-esteem.    Yvonne [name] 01/04/2013, 11:04 AM

## 2013-01-04 NOTE — Progress Notes (Signed)
Patient ID: Yvonne [name], female   DOB: 05-02-1966, 47 y.o.   MRN: [id number] [facility] MD Progress Note  01/04/2013 2:29 PM Ceci Taliaferro  MRN:  [id number] Subjective: "I need to get Arlan Organ here! My lights have been turned off! I need to make a mortgage payment!" Objective: Alinda Money has been up and active on the unit today, and has changed her hairstyle. She has been pleasant and cooperative and informative. She states that she needs to leave as noted above and is reminded that we set a target day for tomorrow. She asked about her medication which has been decreased for her Zoloft and increase for her Lamictal. She is goal directed in her speech.  Diagnosis:  Bipolar disorder, unspecified   ADL's:  Intact  Sleep: Fair  Appetite:  Good  Suicidal Ideation:  denies Homicidal Ideation:  Denies but states she "will defend herself if needed." AEB (as evidenced by):  Psychiatric Specialty Exam: Review of Systems  Constitutional: Negative.  Negative for fever, chills, weight loss, malaise/fatigue and diaphoresis.  HENT: Negative for congestion and sore throat.   Eyes: Negative for blurred vision, double vision and photophobia.  Respiratory: Negative for cough, shortness of breath and wheezing.   Cardiovascular: Negative for chest pain, palpitations and PND.  Gastrointestinal: Negative for heartburn, nausea, vomiting, abdominal pain, diarrhea and constipation.  Musculoskeletal: Negative for myalgias, joint pain and falls.  Neurological: Negative for dizziness, tingling, tremors, sensory change, speech change, focal weakness, seizures, loss of consciousness, weakness and headaches.  Endo/Heme/Allergies: Negative for polydipsia. Does not bruise/bleed easily.  Psychiatric/Behavioral: Negative for depression, suicidal ideas, hallucinations, memory loss and substance abuse. The patient is not nervous/anxious and does not have insomnia.     Blood pressure 106/76, pulse 96, temperature 98.5 F (36.9 C),  temperature source Oral, resp. rate 16, height 5\' 5"  (1.651 m), weight 55.339 kg (122 lb).Body mass index is 20.3 kg/(m^2).  General Appearance: Casual  Eye Contact::  Fair  Speech:  Normal   Volume:  Increased  Mood:  Less grandiose   Affect:  Less labile   Thought Process:  More goal-directed   Orientation:  Full (Time, Place, and Person)  Thought Content:  Paranoid Ideation  Suicidal Thoughts:  No  Homicidal Thoughts:  No  Memory:  NA  Judgement:  Improved   Insight:  Shallow   Psychomotor Activity:  Normal   Concentration:  Fair  Recall:  Fair  Akathisia:  No  Handed:  Right  AIMS (if indicated):     Assets:  Communication Skills Desire for Improvement Housing Physical Health Social Support Talents/Skills Vocational/Educational  Sleep:  Number of Hours: 6.5   Current Medications: Current Facility-Administered Medications  Medication Dose Route Frequency Provider Last Rate Last Dose  . acetaminophen (TYLENOL) tablet 650 mg  650 mg Oral Q6H PRN Earney Navy, NP      . alum & mag hydroxide-simeth (MAALOX/MYLANTA) 200-200-20 MG/5ML suspension 30 mL  30 mL Oral Q4H PRN Earney Navy, NP      . hydrOXYzine (ATARAX/VISTARIL) tablet 25 mg  25 mg Oral QHS PRN Earney Navy, NP      . lamoTRIgine (LAMICTAL) tablet 50 mg  50 mg Oral Daily Verne Spurr, PA-C   50 mg at 01/04/13 0750  . LORazepam (ATIVAN) tablet 0.5 mg  0.5 mg Oral BID Verne Spurr, PA-C   0.5 mg at 01/04/13 0751  . LORazepam (ATIVAN) tablet 1 mg  1 mg Oral Q4H PRN Earney Navy, NP   1  mg at 01/03/13 0850  . magnesium hydroxide (MILK OF MAGNESIA) suspension 30 mL  30 mL Oral Daily PRN Earney Navy, NP      . multivitamin with minerals tablet 1 tablet  1 tablet Oral Daily Jeoffrey Massed, RD   1 tablet at 01/04/13 0750  . OLANZapine zydis (ZYPREXA) disintegrating tablet 10 mg  10 mg Oral Q8H PRN Mojeed Akintayo      . sertraline (ZOLOFT) tablet 50 mg  50 mg Oral Daily Verne Spurr, PA-C    50 mg at 01/04/13 1610    Lab Results: No results found for this or any previous visit (from the past 48 hour(s)).  Physical Findings: AIMS: Facial and Oral Movements Muscles of Facial Expression: None, normal Lips and Perioral Area: None, normal Jaw: None, normal Tongue: None, normal,Extremity Movements Upper (arms, wrists, hands, fingers): None, normal Lower (legs, knees, ankles, toes): None, normal, Trunk Movements Neck, shoulders, hips: None, normal, Overall Severity Severity of abnormal movements (highest score from questions above): None, normal Incapacitation due to abnormal movements: None, normal Patient's awareness of abnormal movements (rate only patient's report): No Awareness, Dental Status Current problems with teeth and/or dentures?: No Does patient usually wear dentures?: No  CIWA:    COWS:     Treatment Plan Summary: Daily contact with patient to assess and evaluate symptoms and progress in treatment Medication management  Plan: 1. zoloft will be continued at 50mg  po qd. 2. Lamictal will be continued at 50mg  po qd. 3. Ativan will stay as written. 4. ELOS: 1 5. Will plan for discharge in the morning, med rec will be completed today prescriptions will be signed, so that patient may leave as soon as possible in the morning after her SRA.  Medical Decision Making Problem Points:  Established problem, worsening (2) and Review of psycho-social stressors (1) Data Points:  Review of medication regiment & side effects (2)  I certify that inpatient services furnished can reasonably be expected to improve the patient's condition.  Rona Ravens. Tarrence Enck RPAC 2:29 PM 01/04/2013

## 2013-01-05 DIAGNOSIS — F411 Generalized anxiety disorder: Secondary | ICD-10-CM

## 2013-01-05 DIAGNOSIS — F39 Unspecified mood [affective] disorder: Secondary | ICD-10-CM

## 2013-01-05 NOTE — Tx Team (Signed)
  Interdisciplinary Treatment Plan Update (Adult)  Date: 01/05/2013   Time Reviewed:  9:45 AM  Progress in Treatment: Attending groups: Yes Participating in groups:  Yes Taking medication as prescribed:  Yes Tolerating medication:  Yes Family/Significant othe contact made: Yes Patient understands diagnosis:  Yes Discussing patient identified problems/goals with staff:  Yes Medical problems stabilized or resolved:  Yes Denies suicidal/homicidal ideation: Yes Issues/concerns per patient self-inventory:  Yes Other:  New problem(s) identified: N/A  Discharge Plan or Barriers: Pt will follow up at [facility] outpatient, Dr. Lolly Mustache for medication management.   Reason for Continuation of Hospitalization: D/C today  Comments: N/A  Estimated length of stay: D/C today  For review of initial/current patient goals, please see plan of care.  Attendees: Patient:     Family:     Physician:  Dr. Thedore Mins 01/05/2013 9:34 AM   Nursing:   Shelda Jakes, RN 01/05/2013 9:34 AM   Clinical Social Worker:  Reyes Ivan, LCSWA 01/05/2013 9:34 AM   Other: Lamount Cranker, RN 01/05/2013 9:34 AM   Other:     Other:     Other:     Other:    Other:    Other:    Other:    Other:    Other:     Scribe for Treatment Team:   Carmina Miller, 01/05/2013 9:34 AM

## 2013-01-05 NOTE — Progress Notes (Signed)
[facility] Adult Case Management Discharge Plan :  Will you be returning to the same living situation after discharge: Yes,  returning home At discharge, do you have transportation home?:Yes,  access to transportation Do you have the ability to pay for your medications:Yes,  access to meds  Release of information consent forms completed and in the chart;  Patient's signature needed at discharge.  Patient to Follow up at: Follow-up Information   Follow up with [facility] On 01/08/2013. (Walk in on this date for hospital discharge appointment.  Walk in clinic Monday - Friday 8 am - 3 pm)    Contact information:   [address], [place] [address] Phone: [phone] Fax: [phone]      Patient denies SI/HI:   Yes,  denies SI/HI    Safety Planning and Suicide Prevention discussed:  Yes,  discussed with pt.  Pt refused contact with family.  See suicide prevention note.  Pt had follow up scheduled with Dr. Lolly Mustache with [facility] up until today, when CSW was informed that pt was no longer able to be seen by him.  CSW attempted to secure follow up with psychiatrists in the community with no luck.  Pt then became agitated and eager to d/c so she stated that she would just follow up with [facility] for now.    Yvonne [name] 01/05/2013, 2:30 PM

## 2013-01-05 NOTE — Progress Notes (Signed)
D) Pt being discharged to home. Mood and affect are appropriate. Denies SI and HI. Rates her depression and hopelessness both at a 0.  A) All discharge information given to Pt. All medications explained to Pt. Is aware of her follow up appointment and plans to go. Prescriptions given and Pt's belongings returned to her. R) Denies SI and HI. Was waiting for the cab that she requested.
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
## 2013-01-05 NOTE — BHH Group Notes (Signed)
[facility] LCSW Aftercare Discharge Planning Group Note   01/03/2013 8:45 AM  Participation Quality:  Alert and Appropriate   Mood/Affect:  Appropriate and Calm  Depression Rating:  Pt denies  Anxiety Rating:  Pt denies  Thoughts of Suicide:  Pt denies SI/HI  Will you contract for safety?   Yes  Current AVH:  Pt denies AVH  Plan for Discharge/Comments:  Pt attended discharge planning group and actively participated in group.  CSW provided pt with today's workbook. Pt reports feeling stable to d/c today.  Pt will return to own home in [place].  Pt will follow up with Dr. Lolly Mustache with [facility] for medication management.   No further needs voiced by pt at this time.     Transportation Means: Pt states that her family is giving her a hard time and will not pick her up.  Pt requesting taxi voucher and refuses to ride the bus.    Supports: No supports mentioned at this time  Reyes Ivan, LCSWA 01/03/2013 9:41 AM

## 2013-01-05 NOTE — Progress Notes (Signed)
Seen and agreed. Brad Mcgaughy, MD 

## 2013-01-05 NOTE — Progress Notes (Signed)
Seen and agreed. Dreden Rivere, MD 
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
## 2013-01-05 NOTE — Clinical Social Work Note (Signed)
CSW was informed at this time by [facility] that Dr. Lolly Mustache would no longer see pt, after follow up already scheduled with him.  CSW seeking follow up for pt prior to pt d/c today.   Yvonne [name], LCSWA 01/05/2013  12:08 PM

## 2013-01-08 ENCOUNTER — Telehealth (HOSPITAL_COMMUNITY): Payer: Self-pay

## 2013-01-09 ENCOUNTER — Telehealth (HOSPITAL_COMMUNITY): Payer: Self-pay

## 2013-01-09 ENCOUNTER — Telehealth (HOSPITAL_COMMUNITY): Payer: Self-pay | Admitting: Psychiatry

## 2013-01-09 NOTE — Telephone Encounter (Signed)
1:45pm 01/09/13 Called patient at the request of the provider due to the paperwork that the patient left for Dr. Lolly Mustache to complete the patient stated that she wasn't coming in until 02/02/13 because she didn't need to see him before then and that she is still upset with him because Dr. Lolly Mustache wanted to put her on Risperdal and she is not taking it.  Patient also stated that she will come and pick-up the disability claim forms and tell the insurance company that Dr. Lolly Mustache refused to fill the papers out.   [name]) offer a 3:45pm slot today but patient stated "no I will wait until 07/25"  conversation ended./sh

## 2013-01-09 NOTE — Telephone Encounter (Signed)
I tried to return phone call back to her home and cell phone are disconnected.  I was given the phone number of her boyfriend [phone] but voicemail was full.  I could not leave a message.

## 2013-01-09 NOTE — Telephone Encounter (Signed)
9:21AM 01/09/13 Patient called stating that she need papers completed so that she get her money to pay bills - patient also stated that "Dr. Lolly Mustache put me in the hospital for 5 days for no reason and I have been thru hell these past few days - [name] are off - my home is in foreclosure - my phone is off  - I have been shackle and handcuff for no reason and before I left the hospital - I was told that Dr. Lolly Mustache had dismissed me from his care and before I left the hospital a lady told me  to call [facility] to see another provider"   [name]) informed the patient I was trying to get in touch with her to give her an appointment because the provider wanted to see her and then I asked if patient had already called [facility] and she stated "No".  Patient stated that she had papers that needed to be complete by Dr. Lolly Mustache.  Appointment is scheduled for 02-02-13 at 1:00pm.  Asked patient if she had a good contact # - patient stated that for now her boyfriend [phone] asked if it was ok to leave a msg if no answer the patient stated "yes"  Conversation ended./sh

## 2013-01-09 NOTE — Progress Notes (Signed)
Patient Discharge Instructions:  After Visit Summary (AVS):   Faxed to:  01/09/13 Discharge Summary Note:   Faxed to:  01/09/13 Psychiatric Admission Assessment Note:   Faxed to:  01/09/13 Suicide Risk Assessment - Discharge Assessment:   Faxed to:  01/09/13 Faxed/Sent to the Next Level Care provider:  01/09/13 Faxed to [facility] @ [phone]  Jerelene Redden, 01/09/2013, 4:12 PM

## 2013-01-10 ENCOUNTER — Telehealth (HOSPITAL_COMMUNITY): Payer: Self-pay

## 2013-01-10 NOTE — Discharge Summary (Signed)
Seen and agreed. Eulanda Dorion, MD 

## 2013-01-23 ENCOUNTER — Ambulatory Visit (HOSPITAL_COMMUNITY): Payer: Self-pay | Admitting: Psychiatry

## 2013-01-26 ENCOUNTER — Ambulatory Visit (HOSPITAL_COMMUNITY): Payer: Self-pay | Admitting: Psychiatry

## 2013-01-29 ENCOUNTER — Encounter (HOSPITAL_COMMUNITY): Payer: Self-pay | Admitting: Psychiatry

## 2013-01-29 ENCOUNTER — Ambulatory Visit (INDEPENDENT_AMBULATORY_CARE_PROVIDER_SITE_OTHER): Payer: 59 | Admitting: Psychiatry

## 2013-01-29 VITALS — BP 110/93 | HR 90 | Ht 62.0 in | Wt 126.0 lb

## 2013-01-29 DIAGNOSIS — F329 Major depressive disorder, single episode, unspecified: Secondary | ICD-10-CM

## 2013-01-29 NOTE — Progress Notes (Signed)
[facility] [address] Progress Note  Yvonne [name] [id number] 47 y.o.  01/29/2013 2:52 PM  Chief Complaint:  I am not taking any medication.            History of presenting illness Patient is 47 year old [place] American female who came with her friend for her followup appointment.  Patient appears very tense irritable and angry.  She has been calling us and demanding to complete disability papers.  Patient has been admitted twice inpatient due to agitation anger and severe mood swing.  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  Patient was [facility] at [facility] and then [facility] to [facility] for agitation anger or mood swing.  Patient accuses her family member for the reason of psychiatric admission.  Patient told she has been cut off from the family.  Her family does not allow her to see her father.  Apparently there is a 50 [address] and she cannot visit her father's property.  Patient to her son who has a bipolar disorder Production designer, theatre/television/film.  She has no gassing utilities.  Her car is broken down.  She cannot go to a doctor's appointment.  Her friend his helping and taking her to a doctor's appointment.  Patient admitted that she does not take any medication.  She does not believe she has any psychiatric illness.  She feels that she has depression and anger because of the family situation.  Patient is also not very happy with this Clinical research associate , she accuses been hospitalized due to the lack of family support.  Patient has been admitted multiple times due to depression and anger.  She does not believe she has any psychiatric illness.  She since she has not seen her family member she is doing better.  She denies any suicidal thoughts or homicidal thoughts.  She is on disability until August 5 .  She has any hallucination or any paranoia but admitted  frustrated with her family.  Initially she was very irritable and agitated later she calmed down and excepted the referral to see counselor and psychiatrist at [facility].    Suicidal Ideation: No Plan Formed: No Patient has means to carry out plan: No  Homicidal Ideation: No Plan Formed: No Patient has means to carry out plan: No  Review of Systems: Psychiatric: Agitation: Yes Hallucination: No Depressed Mood: Yes Insomnia: Yes Hypersomnia: No Altered Concentration: No Feels Worthless: No Grandiose Ideas: No Belief In Special Powers: No New/Increased Substance Abuse: No Compulsions: No  Neurologic: Headache: No Seizure: No Paresthesias: No  Past psychiatric history Patient was admitted to behavioral Center due to significant depression and feeling overwhelmed.  Patient denies any history of suicidal attempt psychosis mania or any hallucination.  Medical history Patient does not have any active medical problems.  Family and Social History: Patient endorse sister and brother has depression.  Patient's son has bipolar disorder.  She lives in [place].  She was born and raised here.  She denies any history of physical sexual verbal or emotional abuse.  She has one son who is 48 year old.  She's working at [employer] for past 17 years.  She is currently on FMLA.  Outpatient Encounter Prescriptions as of 01/29/2013  Medication Sig Dispense Refill  . lamoTRIgine (LAMICTAL) 25 MG tablet Take 2 tablets (50 mg total) by mouth daily.  60 tablet  0  .  LORazepam (ATIVAN) 0.5 MG tablet Take 0.5 mg by mouth 2 (two) times daily as needed for anxiety.      . sertraline (ZOLOFT) 50 MG tablet Take 1 tablet (50 mg total) by mouth daily.  30 tablet  0   No facility-administered encounter medications on file as of 01/29/2013.    Past Psychiatric History/Hospitalization(s): Anxiety: Yes Bipolar Disorder: No Depression: Yes Mania: No Psychosis: No Schizophrenia:  No Personality Disorder: No Hospitalization for psychiatric illness: Yes History of Electroconvulsive Shock Therapy: No Prior Suicide Attempts: No  Physical Exam: Constitutional:  BP 110/93  Pulse 90  Ht 5\' 2"  (1.575 m)  Wt 126 lb (57.153 kg)  BMI 23.04 kg/m2  General Appearance: alert, oriented, no acute distress  Musculoskeletal: Strength & Muscle Tone: within normal limits Gait & Station: normal Patient leans: N/A  Mental status examination  Patient is casually dressed and well groomed.  She maintained  fair eye contact.  She is easily irritable and angry.  Her speech is pressured and rapid at times.  She's very angry at her family member denies any active or passive suicidal thoughts or homicidal thoughts.  She denies any auditory or visual hallucination.  There were no paranoia or delusion present at this time.  Her thought process is fast.  Her fund of knowledge is adequate.  She's alert and oriented x3.  Her insight judgment is fair.  Her impulse control is okay.  Medical Decision Making (Choose Three): Review of Psycho-Social Stressors (1), Established Problem, Worsening (2), Review of Last Therapy Session (1), Review of Medication Regimen & Side Effects (2) and Review of New Medication or Change in Dosage (2)  Assessment: Axis I: Maj. depressive disorder  Axis II: Deferred  Axis III: No active medical problems  Axis IV: Mild to moderate  Axis V: 50-55   Plan:  Patient does not want to take any medication.  She requested to be seen some else .  She believed her psychiatric illnesses due to her family situation.  She's not taking Zoloft, Ativan and Lamictal.  She has multiple hospitalization which she believed due to too family stress.  I have a long discussion with the patient while her friend was present in the session.  Explain that depression and come back if she does not take any medication to see counselor.   Patient agreed to see counselor at [facility] and if needed  psychiatrist at the same place.  Patient is not going to [place] since May 22.  I will complete the disability paper from the 22nd to August 5 .  However the patient needs to see his current therapist for the future recommendation.  At this time patient does not take any medication.  Explain in detail the risk of relapse and noncompliance of medication.  At this time patient does not have any suicidal thoughts or homicidal thoughts and does not meet criteria for involuntary commitment.  Recommend to call us back if she needed any help in the future.  Time spent 25 minutes. More than 50% of the time spent in psychoeducation counseling and coordination of care.  Per her request we will terminate her case from this office.  Portion of this note is generated with voice dictation software and may contain typographical error.  Chrisa Hassan T., MD 01/29/2013

## 2013-01-30 ENCOUNTER — Encounter (HOSPITAL_COMMUNITY): Payer: Self-pay | Admitting: *Deleted

## 2013-01-31 ENCOUNTER — Encounter (HOSPITAL_COMMUNITY): Payer: Self-pay | Admitting: *Deleted

## 2013-02-02 ENCOUNTER — Ambulatory Visit (HOSPITAL_COMMUNITY): Payer: Self-pay | Admitting: Psychiatry

## 2013-04-24 ENCOUNTER — Ambulatory Visit (HOSPITAL_COMMUNITY)
Admission: RE | Admit: 2013-04-24 | Discharge: 2013-04-24 | Disposition: A | Discharge status: Home / Self Care | Payer: 59 | Attending: Psychiatry | Admitting: Psychiatry

## 2013-04-24 ENCOUNTER — Emergency Department (HOSPITAL_COMMUNITY)
Admission: EM | Admit: 2013-04-24 | Discharge: 2013-04-24 | Disposition: A | Discharge status: Other Acute Inpatient Hospital | Payer: 59 | Attending: Emergency Medicine | Admitting: Emergency Medicine

## 2013-04-24 ENCOUNTER — Inpatient Hospital Stay (HOSPITAL_COMMUNITY)
Admission: AD | Admit: 2013-04-24 | Discharge: 2013-05-04 | DRG: 885 | Disposition: A | Discharge status: Home / Self Care | Payer: 59 | Source: Intra-hospital | Attending: Psychiatry | Admitting: Psychiatry

## 2013-04-24 ENCOUNTER — Encounter (HOSPITAL_COMMUNITY): Payer: Self-pay | Admitting: Emergency Medicine

## 2013-04-24 DIAGNOSIS — G4700 Insomnia, unspecified: Secondary | ICD-10-CM | POA: Insufficient documentation

## 2013-04-24 DIAGNOSIS — IMO0002 Reserved for concepts with insufficient information to code with codable children: Secondary | ICD-10-CM | POA: Insufficient documentation

## 2013-04-24 DIAGNOSIS — F314 Bipolar disorder, current episode depressed, severe, without psychotic features: Principal | ICD-10-CM | POA: Diagnosis present

## 2013-04-24 DIAGNOSIS — F329 Major depressive disorder, single episode, unspecified: Secondary | ICD-10-CM

## 2013-04-24 DIAGNOSIS — Z79899 Other long term (current) drug therapy: Secondary | ICD-10-CM

## 2013-04-24 DIAGNOSIS — Z598 Other problems related to housing and economic circumstances: Secondary | ICD-10-CM

## 2013-04-24 DIAGNOSIS — Z5987 Material hardship due to limited financial resources, not elsewhere classified: Secondary | ICD-10-CM

## 2013-04-24 DIAGNOSIS — F319 Bipolar disorder, unspecified: Secondary | ICD-10-CM

## 2013-04-24 DIAGNOSIS — Z3202 Encounter for pregnancy test, result negative: Secondary | ICD-10-CM | POA: Insufficient documentation

## 2013-04-24 DIAGNOSIS — F411 Generalized anxiety disorder: Secondary | ICD-10-CM | POA: Diagnosis present

## 2013-04-24 DIAGNOSIS — F3130 Bipolar disorder, current episode depressed, mild or moderate severity, unspecified: Secondary | ICD-10-CM | POA: Insufficient documentation

## 2013-04-24 DIAGNOSIS — R450 Nervousness: Secondary | ICD-10-CM | POA: Insufficient documentation

## 2013-04-24 DIAGNOSIS — F3163 Bipolar disorder, current episode mixed, severe, without psychotic features: Secondary | ICD-10-CM | POA: Diagnosis present

## 2013-04-24 LAB — CBC WITH DIFFERENTIAL/PLATELET
Basophils Absolute: 0 10*3/uL (ref 0.0–0.1)
Basophils Relative: 0 % (ref 0–1)
Eosinophils Absolute: 0 10*3/uL (ref 0.0–0.7)
MCHC: 34.4 g/dL (ref 30.0–36.0)
Neutro Abs: 6.1 10*3/uL (ref 1.7–7.7)
Neutrophils Relative %: 72 % (ref 43–77)
Platelets: 383 10*3/uL (ref 150–400)
RDW: 13 % (ref 11.5–15.5)

## 2013-04-24 LAB — COMPREHENSIVE METABOLIC PANEL
AST: 16 U/L (ref 0–37)
Albumin: 3.7 g/dL (ref 3.5–5.2)
Alkaline Phosphatase: 82 U/L (ref 39–117)
Chloride: 101 mEq/L (ref 96–112)
Potassium: 3.3 mEq/L — ABNORMAL LOW (ref 3.5–5.1)
Total Bilirubin: 0.4 mg/dL (ref 0.3–1.2)
Total Protein: 7.9 g/dL (ref 6.0–8.3)

## 2013-04-24 LAB — RAPID URINE DRUG SCREEN, HOSP PERFORMED
Amphetamines: NOT DETECTED
Benzodiazepines: NOT DETECTED
Opiates: NOT DETECTED
Tetrahydrocannabinol: NOT DETECTED

## 2013-04-24 MED ORDER — ZOLPIDEM TARTRATE 5 MG PO TABS
5.0000 mg | ORAL_TABLET | Freq: Every evening | ORAL | Status: DC | PRN
  Administered 2013-04-24: 5 mg via ORAL
  Filled 2013-04-24: qty 1

## 2013-04-24 MED ORDER — LORAZEPAM 1 MG PO TABS
1.0000 mg | ORAL_TABLET | Freq: Three times a day (TID) | ORAL | Status: DC | PRN
  Administered 2013-04-24: 1 mg via ORAL
  Filled 2013-04-24: qty 1

## 2013-04-24 MED ORDER — ACETAMINOPHEN 325 MG PO TABS: 650.0000 mg | ORAL_TABLET | ORAL | Status: DC | PRN

## 2013-04-24 MED ORDER — ONDANSETRON HCL 4 MG PO TABS: 4.0000 mg | ORAL_TABLET | Freq: Three times a day (TID) | ORAL | Status: DC | PRN

## 2013-04-24 NOTE — ED Notes (Signed)
Pt. Ambulatory, NAD

## 2013-04-24 NOTE — ED Notes (Signed)
Per patient has not slept for 2 days-history of anxiety and depression
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
## 2013-04-24 NOTE — Treatment Plan (Signed)
Pt accepted to [facility] by Dr. Lolly Mustache to [phone].

## 2013-04-24 NOTE — ED Notes (Signed)
Pt. Arrived on unit via Ambulatory with a steady gait, no acute distress noted

## 2013-04-24 NOTE — ED Notes (Signed)
Pt has  Belongings pr shoes, shirt, pants, bra, sweater , and jacket. Pt has been seen and wanded by security.

## 2013-04-24 NOTE — ED Notes (Signed)
Family at bedside.Family stated that pt was sent from Behavioral Health to "stay at [facility] because there are no beds"  Pt is very weepy and anxious.  This RN spoke with Minerva Areola at [facility]. He recommended that we continue with pt as a medical clearance patient. Family/pt advised of plan.

## 2013-04-24 NOTE — ED Notes (Signed)
Pt. Stated she has not slept since Friday. Just feeling overwhelmed with bills and other life events. Denies SI/HI/AVH. Asked for some sleep aid, given Ambien and ativan to help calm anxiety level.

## 2013-04-24 NOTE — Progress Notes (Signed)
CSW completed support paperwork with pt.  Faxed over to [facility] and gave originals to pts nurse.  CSW attempted to call EDP  3x to discharge pt to [facility] but he was unavailable rounding on other pts.  Pts nurse reports that she will contact EDP for discharge.  Marva Panda, Theresia Majors  [phone]  .04/24/2013 10:40 pm

## 2013-04-24 NOTE — ED Provider Notes (Signed)
CSN: [id number]     Arrival date & time 04/24/13  1738 History   First MD Initiated Contact with Patient 04/24/13 1900     Chief Complaint  Patient presents with  . Insomnia  . Anxiety    pt very weepy    (Consider location/radiation/quality/duration/timing/severity/associated sxs/prior Treatment) Patient is a 47 y.o. female presenting with anxiety. The history is provided by the patient. No language interpreter was used.  Anxiety This is a recurrent problem. The current episode started in the past 7 days. Pertinent negatives include no chills or fever. Associated symptoms comments: She is here with family who reports symptoms of bipolar, manic episode for the past several days. She has not been sleeping more than 1-2 hours at a time for 3 days. Denies hallucinations, SI/HI. She was assessed, per family, at [facility] prior to arrival here and told that she was accepted but no beds at this time. She was instructed to come here for medical clearance and has bed at [facility] pending availability. .    Past Medical History  Diagnosis Date  . No pertinent past medical history   . Medical history non-contributory   . Mental disorder    Past Surgical History  Procedure Laterality Date  . No past surgeries     Family History  Problem Relation Age of Onset  . Depression Sister   . Depression Brother   . Bipolar disorder Cousin   . Depression Sister    History  Substance Use Topics  . Smoking status: Never Smoker   . Smokeless tobacco: Never Used  . Alcohol Use: No   OB History   Grav Para Term Preterm Abortions TAB SAB Ect Mult Living                 Review of Systems  Constitutional: Negative for fever and chills.  HENT: Negative.   Respiratory: Negative.   Cardiovascular: Negative.   Gastrointestinal: Negative.   Musculoskeletal: Negative.   Skin: Negative.   Neurological: Negative.   Psychiatric/Behavioral: Positive for agitation. Negative for suicidal ideas and hallucinations. The  patient is nervous/anxious.     Allergies  Review of patient's allergies indicates no known allergies.  Home Medications   Current Outpatient Rx  Name  Route  Sig  Dispense  Refill  . lamoTRIgine (LAMICTAL) 25 MG tablet   Oral   Take 2 tablets (50 mg total) by mouth daily.   60 tablet   0   . LORazepam (ATIVAN) 0.5 MG tablet   Oral   Take 0.5 mg by mouth 2 (two) times daily as needed for anxiety.         . sertraline (ZOLOFT) 50 MG tablet   Oral   Take 1 tablet (50 mg total) by mouth daily.   30 tablet   0    BP 110/73  Pulse 87  Temp(Src) 98.2 F (36.8 C) (Oral)  Resp 18  SpO2 100% Physical Exam  Constitutional: She is oriented to person, place, and time. She appears well-developed and well-nourished.  HENT:  Head: Normocephalic.  Neck: Normal range of motion. Neck supple.  Pulmonary/Chest: Effort normal. No respiratory distress.  Abdominal: Soft. Bowel sounds are normal. There is no tenderness. There is no rebound and no guarding.  Musculoskeletal: Normal range of motion.  Neurological: She is alert and oriented to person, place, and time.  Skin: Skin is warm and dry. No rash noted.  Psychiatric: Her mood appears anxious. She is agitated. She expresses impulsivity.  ED Course  Procedures (including critical care time) Labs Review Labs Reviewed  CBC WITH DIFFERENTIAL  COMPREHENSIVE METABOLIC PANEL  ETHANOL  URINE RAPID DRUG SCREEN (HOSP PERFORMED)   Imaging Review No results found.  EKG Interpretation   None       MDM  No diagnosis found. 1. Bipolar - manic  Discussed patient's prior [facility] assessment with Minerva Areola at [facility] who confirmed that patient has been accepted pending bed availability.     Arnoldo Hooker, PA-C 04/24/13 [id number]

## 2013-04-24 NOTE — ED Provider Notes (Signed)
10:50 PM Pt accepted to [facility] by [name]. Will transfer. I was not directly involved in this patients care.   Junius Argyle, MD 04/25/13 1256

## 2013-04-25 ENCOUNTER — Encounter (HOSPITAL_COMMUNITY): Payer: Self-pay | Admitting: Behavioral Health

## 2013-04-25 DIAGNOSIS — F3130 Bipolar disorder, current episode depressed, mild or moderate severity, unspecified: Secondary | ICD-10-CM

## 2013-04-25 DIAGNOSIS — F3163 Bipolar disorder, current episode mixed, severe, without psychotic features: Secondary | ICD-10-CM | POA: Diagnosis present

## 2013-04-25 MED ORDER — CITALOPRAM HYDROBROMIDE 10 MG PO TABS
10.0000 mg | ORAL_TABLET | Freq: Every day | ORAL | Status: DC
  Administered 2013-04-25 – 2013-04-26 (×2): 10 mg via ORAL
  Filled 2013-04-25 (×3): qty 1

## 2013-04-25 MED ORDER — ALUM & MAG HYDROXIDE-SIMETH 200-200-20 MG/5ML PO SUSP: 30.0000 mL | ORAL | Status: DC | PRN

## 2013-04-25 MED ORDER — OLANZAPINE 10 MG PO TBDP: 10.0000 mg | ORAL_TABLET | Freq: Three times a day (TID) | ORAL | Status: DC | PRN

## 2013-04-25 MED ORDER — CARBAMAZEPINE 200 MG PO TABS
200.0000 mg | ORAL_TABLET | Freq: Every day | ORAL | Status: DC
  Administered 2013-04-25 – 2013-04-26 (×2): 200 mg via ORAL
  Filled 2013-04-25 (×3): qty 1

## 2013-04-25 MED ORDER — ACETAMINOPHEN 325 MG PO TABS: 650.0000 mg | ORAL_TABLET | Freq: Four times a day (QID) | ORAL | Status: DC | PRN

## 2013-04-25 MED ORDER — MAGNESIUM HYDROXIDE 400 MG/5ML PO SUSP
30.0000 mL | Freq: Every day | ORAL | Status: DC | PRN
  Administered 2013-04-27: 30 mL via ORAL

## 2013-04-25 MED ORDER — TRAZODONE HCL 50 MG PO TABS: 50.0000 mg | ORAL_TABLET | Freq: Every evening | ORAL | Status: DC | PRN
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
## 2013-04-25 NOTE — [facility] Group Notes (Signed)
[facility] LCSW Aftercare Discharge Planning Group Note   04/25/2013 10:18 AM  Participation Quality:  Minimal  Mood/Affect:  Depressed  Depression Rating:  2  Anxiety Rating:  10  Thoughts of Suicide:  No Will you contract for safety?   NA  Current AVH:  No  Plan for Discharge/Comments:  Yvonne [name] states she is here because she has not been sleeping and is feeling overwhelmed due to financial stressors.  States she is living on her own, and sister is her support.  Presentation is quiet, sad, grim with accompanying anxiety.    Transportation Means: family  Supports: family  [place], [name]

## 2013-04-25 NOTE — Progress Notes (Signed)
Recreation Therapy Notes  Date: 10.15.2014 Time: 9:30am Location: [address] Dayroom   Group Topic: Memory/Cognition  Goal Area(s) Addresses:  Patient will verbalize understanding of importance of working on memory/cognition. Patient will successfully repeat sequence developed by group.   Behavioral Response: Animated, Hallucinating   Intervention: Game  Activity: Memory Sequence. Patient with LRT and group members developed a sequence of actions. Patients were asked to complete the sequence created by group and when prompted add onto that sequence.   Education:  Cognition  Education Outcome: Needs additional education  Clinical Observations/Feedback: LRT initially intended on patients playing group rock paper scissors. As LRT, with assistance of MHT staff explained rules of group activity it was apparent patients were unable to process instructions provided. Due to patients inability to interact in planned activity LRT changed group activity to memory sequence game.   Patient stated several times he was "stupid" or "retarded" for not understanding instructions. LRT in addition to MHT staff assured him he was none of those things and encouraged his participation in remainder of group session. Patient expressed he was not able to understand instructions several times, often asking LRT to repeat herself, however patient was still unable to understand instructions of first activity.   Prior to group session patient was observed to be involved in conversation with himself. Patient was overheard referencing a woman and God. Patient participated in group activity for approximately three rounds before asking if the activity was over yet. When LRT informed patient the activity was not over patient left group session. Patient did not return to group session.   Marykay Lex [place], LRT/CTRS  Jearl Klinefelter 04/25/2013 4:32 PM

## 2013-04-25 NOTE — [facility] Suicide Risk Assessment (Signed)
Suicide Risk Assessment  Admission Assessment     Nursing information obtained from:  Patient Demographic factors:  Low socioeconomic status;Living alone;Unemployed Current Mental Status:  NA Loss Factors:  Financial problems / change in socioeconomic status Historical Factors:  Family history of mental illness or substance abuse Risk Reduction Factors:  Positive social support;Positive therapeutic relationship  CLINICAL FACTORS:   Bipolar Disorder:   Depressive phase Depression:   Anhedonia Hopelessness Impulsivity Insomnia Severe Previous Psychiatric Diagnoses and Treatments  COGNITIVE FEATURES THAT CONTRIBUTE TO RISK:  Closed-mindedness Polarized thinking    SUICIDE RISK:   Minimal: No identifiable suicidal ideation.  Patients presenting with no risk factors but with morbid ruminations; may be classified as minimal risk based on the severity of the depressive symptoms  PLAN OF CARE:1. Admit for crisis management and stabilization. 2. Medication management to reduce current symptoms to base line and improve the     patient's overall level of functioning 3. Treat health problems as indicated. 4. Develop treatment plan to decrease risk of relapse upon discharge and the need for     readmission. 5. Psycho-social education regarding relapse prevention and self care. 6. Health care follow up as needed for medical problems. 7. Restart home medications where appropriate.   I certify that inpatient services furnished can reasonably be expected to improve the patient's condition.  Thedore Mins, MD 04/25/2013, 10:19 AM

## 2013-04-25 NOTE — H&P (Signed)
Psychiatric Admission Assessment Adult  Patient Identification:  Yvonne [name] Date of Evaluation:  04/25/2013 Chief Complaint:  BIPOLAR DISORDER,MOST RECENT EPISODE DEPRESSED  History of Present Illness::  This is a 47 year old female who presented as a walk in with her family members. Her sister and niece had the patient put under IVC in June of this year for being very hostile and disruptive in her neighborhood. Her family reports she did well for a time after d/c but then began to display manic behaviors such as shopping and gambling out of control. Patient states today "I am so anxious. I am overwhelmed with bills. I've been spending too much. I stopped taking my medications in August. I tried to handle it but I can't. I'm so worried about what is going to happen. I've done wrong and I regret it." Patient had difficulty answering questions due to her high anxiety level. She was noted to be shaking her left leg very rapidly and was holding her head in her hands. Patient noted to also be obsessing about her past spending behaviors and the consequences at present.   Elements:  Location:  Adult in patient unit. Quality:  chronic. Severity:  severe. Timing:  worsening over the last 10 days. Duration:  since January. Context:  patient is on FMLA, has a negative relationship with her family, refuses to take any medication . Associated Signs/Synptoms: Depression Symptoms:  denies (Hypo) Manic Symptoms:  Delusions, Distractibility, Elevated Mood, Flight of Ideas, Grandiosity, Impulsivity, Irritable Mood, Labiality of Mood, Anxiety Symptoms:  Excessive Worry, Psychotic Symptoms:  Delusions, Paranoia, PTSD Symptoms: NA  Psychiatric Specialty Exam: Physical Exam  Constitutional: She appears well-developed and well-nourished.  The patient is seen and the chart is reviewed. I agree with the findings on the PE completed in the ED with no exceptions.  Psychiatric: Her affect is angry and  labile. Her speech is rapid and/or pressured. She is agitated, aggressive and hyperactive. Thought content is paranoid and delusional. Cognition and memory are impaired. She expresses impulsivity and inappropriate judgment. She expresses no homicidal and no suicidal ideation. She expresses no suicidal plans. She exhibits abnormal recent memory and abnormal remote memory.    Review of Systems  Unable to perform ROS: mental acuity  Constitutional: Negative.   HENT: Negative.   Eyes: Negative.   Respiratory: Negative.   Cardiovascular: Negative.   Gastrointestinal: Negative.   Genitourinary: Negative.   Musculoskeletal: Negative.   Skin: Negative.   Neurological: Negative.   Endo/Heme/Allergies: Negative.   Psychiatric/Behavioral: Positive for depression. Negative for suicidal ideas, hallucinations, memory loss and substance abuse. The patient is nervous/anxious. The patient does not have insomnia.     Blood pressure 103/73, pulse 125, temperature 98.1 F (36.7 C), temperature source Oral, resp. rate 20, height 5' 1.02" (1.55 m), weight 60.328 kg (133 lb), SpO2 100.00%.Body mass index is 25.11 kg/(m^2).  General Appearance: Disheveled  Eye Solicitor::  Fair  Speech:  Rapid, pressured, circumstantial  Volume:  Increased  Mood:  Anxious  Affect:  Congruent  Thought Process:  Circumstantial, Rumination  Orientation:  NA  Thought Content:  Delusions and Paranoid Ideation  Suicidal Thoughts:  No  Homicidal Thoughts:  No  Memory:  NA  Judgement:  Impaired  Insight:  Lacking  Psychomotor Activity:  Increased  Concentration:  Poor  Recall:  Fair  Akathisia:  No  Handed:  Right  AIMS (if indicated):     Assets:  Housing  Sleep:  Number of Hours: 5.25  Past Psychiatric History:Yes Diagnosis:Bipolar, Manic  Hospitalizations:[facility]   Outpatient Care:Denies  Substance Abuse Care:Denies  Self-Mutilation:Denies  Suicidal Attempts:Denies  Violent Behaviors:Denies   Past Medical History:    Past Medical History  Diagnosis Date  . No pertinent past medical history   . Medical history non-contributory   . Mental disorder    None. Allergies:  No Known Allergies PTA Medications: Prescriptions prior to admission  Medication Sig Dispense Refill  . LORazepam (ATIVAN) 0.5 MG tablet Take 0.5 mg by mouth 2 (two) times daily as needed for anxiety.        Previous Psychotropic Medications:  Medication/Dose  Ativan  Lamictal   Risperdal            Substance Abuse History in the last 12 months:  no  Consequences of Substance Abuse: NA  Social History:  reports that she has never smoked. She has never used smokeless tobacco. She reports that she does not drink alcohol or use illicit drugs. Additional Social History: Pain Medications: none Prescriptions: see PTA Over the Counter: none History of alcohol / drug use?: No history of alcohol / drug abuse Current Place of Residence:  [employer] of Birth:   Family Members: Marital Status:  Divorced Children:  Sons:  1  Daughters: Relationships: Education:   Educational Problems/Performance: Religious Beliefs/Practices: History of Abuse (Emotional/Phsycial/Sexual) Occupational Experiences; Hotel manager History:  Data processing manager History: Hobbies/Interests:  Family History:   Family History  Problem Relation Age of Onset  . Depression Sister   . Depression Brother   . Bipolar disorder Cousin   . Depression Sister     Results for orders placed during the hospital encounter of 12/31/12 (from the past 72 hour(s))  ACETAMINOPHEN LEVEL     Status: None   Collection Time    12/31/12 10:00 PM      Result Value Range   Acetaminophen (Tylenol), Serum <15.0  10 - 30 ug/mL   Comment:            THERAPEUTIC CONCENTRATIONS VARY     SIGNIFICANTLY. A RANGE OF 10-30     ug/mL MAY BE AN EFFECTIVE     CONCENTRATION FOR MANY PATIENTS.     HOWEVER, SOME ARE BEST TREATED     AT CONCENTRATIONS OUTSIDE THIS     RANGE.      ACETAMINOPHEN CONCENTRATIONS     >150 ug/mL AT 4 HOURS AFTER     INGESTION AND >50 ug/mL AT 12     HOURS AFTER INGESTION ARE     OFTEN ASSOCIATED WITH TOXIC     REACTIONS.  CBC     Status: Abnormal   Collection Time    12/31/12 10:00 PM      Result Value Range   WBC 9.8  4.0 - 10.5 K/uL   RBC 4.28  3.87 - 5.11 MIL/uL   Hemoglobin 11.8 (*) 12.0 - 15.0 g/dL   HCT 16.1 (*) 09.6 - 04.5 %   MCV 80.4  78.0 - 100.0 fL   MCH 27.6  26.0 - 34.0 pg   MCHC 34.3  30.0 - 36.0 g/dL   RDW 40.9  81.1 - 91.4 %   Platelets 316  150 - 400 K/uL  COMPREHENSIVE METABOLIC PANEL     Status: Abnormal   Collection Time    12/31/12 10:00 PM      Result Value Range   Sodium 136  135 - 145 mEq/L   Potassium 3.5  3.5 - 5.1 mEq/L   Chloride 100  96 - 112 mEq/L   CO2 25  19 - 32 mEq/L   Glucose, Bld 85  70 - 99 mg/dL   BUN 15  6 - 23 mg/dL   Creatinine, Ser 1.61  0.50 - 1.10 mg/dL   Calcium 9.9  8.4 - 09.6 mg/dL   Total Protein 8.1  6.0 - 8.3 g/dL   Albumin 4.0  3.5 - 5.2 g/dL   AST 22  0 - 37 U/L   ALT 13  0 - 35 U/L   Alkaline Phosphatase 88  39 - 117 U/L   Total Bilirubin 0.5  0.3 - 1.2 mg/dL   GFR calc non Af Amer 76 (*) >90 mL/min   GFR calc Af Amer 89 (*) >90 mL/min   Comment:            The eGFR has been calculated     using the CKD EPI equation.     This calculation has not been     validated in all clinical     situations.     eGFR's persistently     <90 mL/min signify     possible Chronic Kidney Disease.  ETHANOL     Status: None   Collection Time    12/31/12 10:00 PM      Result Value Range   Alcohol, Ethyl (B) <11  0 - 11 mg/dL   Comment:            LOWEST DETECTABLE LIMIT FOR     SERUM ALCOHOL IS 11 mg/dL     FOR MEDICAL PURPOSES ONLY  SALICYLATE LEVEL     Status: Abnormal   Collection Time    12/31/12 10:00 PM      Result Value Range   Salicylate Lvl <2.0 (*) 2.8 - 20.0 mg/dL  URINE RAPID DRUG SCREEN (HOSP PERFORMED)     Status: None   Collection Time    12/31/12 10:05 PM       Result Value Range   Opiates NONE DETECTED  NONE DETECTED   Cocaine NONE DETECTED  NONE DETECTED   Benzodiazepines NONE DETECTED  NONE DETECTED   Amphetamines NONE DETECTED  NONE DETECTED   Tetrahydrocannabinol NONE DETECTED  NONE DETECTED   Barbiturates NONE DETECTED  NONE DETECTED   Comment:            DRUG SCREEN FOR MEDICAL PURPOSES     ONLY.  IF CONFIRMATION IS NEEDED     FOR ANY PURPOSE, NOTIFY LAB     WITHIN 5 DAYS.                LOWEST DETECTABLE LIMITS     FOR URINE DRUG SCREEN     Drug Class       Cutoff (ng/mL)     Amphetamine      1000     Barbiturate      200     Benzodiazepine   200     Tricyclics       300     Opiates          300     Cocaine          300     THC              50  POCT PREGNANCY, URINE     Status: None   Collection Time    12/31/12 10:40 PM      Result Value Range   Preg Test, Ur NEGATIVE  NEGATIVE   Comment:            THE SENSITIVITY OF THIS     METHODOLOGY IS >24 mIU/mL   Psychological Evaluations:  Assessment:   AXIS I:   Bipolar, Most recent episode depressed  AXIS II:  Borderline Personality Dis. and Cluster B Traits AXIS III:   Past Medical History  Diagnosis Date  . No pertinent past medical history   . Medical history non-contributory   . Mental disorder    AXIS IV:  occupational problems, other psychosocial or environmental problems, problems related to social environment and problems with primary support group AXIS V:  41-50 serious symptoms  Treatment Plan/Recommendations:   1. Admit for crisis management and stabilization. 2. Medication management to reduce current symptoms to base line and improve the patient's overall level of functioning. 3. Treat health problems as indicated. 4. Develop treatment plan to decrease risk of relapse upon discharge and to reduce the need for readmission. 5. Psycho-social education regarding relapse prevention and self care. 6. Health care follow up as needed for medical  problems. 7. Restart home medications where appropriate. 8. Start Tegretol 200 mg po hs for improved mood stability. Start Celexa 10 mg daily for depressive symptoms. Patient has order for Zyprexa Zydis 10 mg every eight hours prn agitation. Trazodone 50 mg hs prn for insomnia.   Treatment Plan Summary: Daily contact with patient to assess and evaluate symptoms and progress in treatment Medication management                                                                               Daily Earney Navy, NP   100 mg at 01/02/13 0809    Observation Level/Precautions:  routine  Laboratory:  Reviewed per the ED  Psychotherapy:   Individual and group  Medications:  See list  Consultations:    If needed  Discharge Concerns:  Increased risk for readmission  Estimated LOS:   5-7 days  Other:     I certify that inpatient services furnished can reasonably be expected to improve the patient's condition.   4:00 PM 04/25/2013 Fransisca Kaufmann NP-CSeen and agreed. Thedore Mins, MD

## 2013-04-25 NOTE — Tx Team (Signed)
Initial Interdisciplinary Treatment Plan  PATIENT STRENGTHS: (choose at least two) Financial means Supportive family/friends  PATIENT STRESSORS: Financial difficulties Medication change or noncompliance   PROBLEM LIST: Problem List/Patient Goals Date to be addressed Date deferred Reason deferred Estimated date of resolution  anxiety 04/24/2013     Overwhelmed about Finances 04/24/2013                                                DISCHARGE CRITERIA:  Ability to meet basic life and health needs Improved stabilization in mood, thinking, and/or behavior Safe-care adequate arrangements made Verbal commitment to aftercare and medication compliance  PRELIMINARY DISCHARGE PLAN: Attend aftercare/continuing care group Outpatient therapy Return to previous living arrangement  PATIENT/FAMIILY INVOLVEMENT: This treatment plan has been presented to and reviewed with the patient, Yvonne [name].  The patient and family have been given the opportunity to ask questions and make suggestions.  [name] 04/25/2013, 12:14 AM

## 2013-04-25 NOTE — Progress Notes (Signed)
47 y/o female who presents voluntarily for anxiety and feeling overwhelmed.  Patient states her main concern is she gambled her money away at the [facility] and cannot pay her monthly bills.  Patient states she was here at [facility] in June but states she has not been taking her medications for months because she did not like the side affects.  Patient states she is not going to take any psychotropic medications while here because she doesn't like the the affects.  Patient currently denies SI/HI and denies AH.  Patient was drowsy during admission due to medications given before patient arrived tonight.  Patient has no medical history and no surgical history.  Patient skin assessed and patient has no skin issues.  Consents obtained, fall safety plan reviewed and patient verbalized understanding.  Food and fluids offered and patient accepted both.  Patient offered not additional questions or concerns.  Patient oriented to the unit and by rn.

## 2013-04-25 NOTE — Progress Notes (Deleted)
Recreation Therapy Notes  Date: 10.15.2014 Time: 9:30am Location: 400 [address] Dayroom   Group Topic: Memory/Cognition  Goal Area(s) Addresses:  Patient will verbalize understanding of importance of working on memory/cognition. Patient will successfully repeat sequence developed by group.   Behavioral Response: Engaged, Appropraite   Intervention: Game  Activity: Memory Sequence. Patient with LRT and group members developed a sequence of actions. Patients were asked to complete the sequence created by group and when prompted add onto that sequence.   Education:  Cognition  Education Outcome: Needs additional education  Clinical Observations/Feedback: LRT initially intended on patients playing group rock paper scissors. As LRT, with assistance of MHT staff explained rules of group activity it was apparent patients were unable to process instructions provided. Due to patients inability to interact in planned activity LRT changed group activity to memory sequence game.   Patient actively participated in memory sequence activity, needing no assistance completing sequence created. Prior to each patient turn she stated she could not remember or was not going to be able to complete the sequence, however patient was able to complete the sequence with no assistance from LRT. This appeared to please patient as she shook her head yes and smiled after each turn.   Marykay Lex [place], LRT/CTRS  Jearl Klinefelter 04/25/2013 4:29 PM

## 2013-04-25 NOTE — Tx Team (Signed)
  Interdisciplinary Treatment Plan Update   Date Reviewed:  04/25/2013  Time Reviewed:  8:06 AM  Progress in Treatment:   Attending groups: Yes Participating in groups: Yes Taking medication as prescribed: Yes  Tolerating medication: Yes Family/Significant other contact made: No  Patient understands diagnosis: Yes As evidenced by asking for help with inability to sleep and feeling overwhelmed Discussing patient identified problems/goals with staff: Yes Medical problems stabilized or resolved: Yes Denies suicidal/homicidal ideation: Yes  In tx team Patient has not harmed self or others: Yes  For review of initial/current patient goals, please see plan of care.  Estimated Length of Stay:  4-5 days  Reason for Continuation of Hospitalization: Depression Hallucinations Medication stabilization  New Problems/Goals identified:  N/A  Discharge Plan or Barriers:   return home, follow up outpt  Additional Comments:  47 y/o female who presents voluntarily for anxiety and feeling overwhelmed. Patient states her main concern is she gambled her money away at the [facility] and cannot pay her monthly bills. Patient states she was here at [facility] in June but states she has not been taking her medications for months because she did not like the side affects. Patient states she is not going to take any psychotropic medications while here because she doesn't like the the affects. Patient currently denies SI/HI and denies AH.    Attendees:  Signature: Thedore Mins, MD 04/25/2013 8:06 AM   Signature: Richelle Ito, LCSW 04/25/2013 8:06 AM  Signature: Fransisca Kaufmann, NP 04/25/2013 8:06 AM  Signature: Joslyn Devon, RN 04/25/2013 8:06 AM  Signature: Liborio Nixon, RN 04/25/2013 8:06 AM  Signature:  04/25/2013 8:06 AM  Signature:   04/25/2013 8:06 AM  Signature:    Signature:    Signature:    Signature:    Signature:    Signature:      Scribe for Treatment Team:   Richelle Ito, LCSW  04/25/2013  8:06 AM

## 2013-04-25 NOTE — Progress Notes (Signed)
D:Patient in her room during this assessment. Mood and affect flat and depressed. She reported feeling anxious and week. Denied SI/HI and denied hallucination.  A: Writer encouraged and supported patient.  R: Patient receptive to encouragement and support. Q 15 minute check continues as ordered to maintain safety.

## 2013-04-25 NOTE — [facility] Group Notes (Signed)
[facility] [employer] Group Therapy  04/25/2013 , 1:39 PM    Type of Therapy:  [employer] Presentation  Participation Level:  Active  Participation Quality:  Attentive  Affect:  Blunted  Cognitive:  Oriented  Insight:  Limited  Engagement in Therapy:  Engaged  Modes of Intervention:  Discussion, Education and Socialization  Summary of Progress/Problems:  Onalee Hua from [employer] came to present his recovery story and play the guitar.  Sat quietly throughout, looking down towards the floor most of the time.  Legs shaking for most of the time.  Daryel Gerald B 04/25/2013 , 1:39 PM

## 2013-04-26 MED ORDER — HYDROXYZINE HCL 25 MG PO TABS: 25.0000 mg | ORAL_TABLET | Freq: Four times a day (QID) | ORAL | Status: DC | PRN

## 2013-04-26 MED ORDER — CITALOPRAM HYDROBROMIDE 20 MG PO TABS
20.0000 mg | ORAL_TABLET | Freq: Every day | ORAL | Status: DC
  Administered 2013-04-27 – 2013-04-30 (×4): 20 mg via ORAL
  Filled 2013-04-26 (×5): qty 1

## 2013-04-26 NOTE — Progress Notes (Signed)
D:  Patient up and present in the milieu.  Out to dayroom much of the day.  Minimal interaction with peers, but she is visible on the unit and attending groups.  She rates depression and hopelessness at 2 and denies suicidal thoughts.  She rates her steep as fair and her appetite as improving.   A:  Medications given as prescribed.  Educated about specific medications.  Offered support and encouragement.   R:  She is tolerating medications, interacting well with staff and peers.  Affect is improving as evidenced by she is interacting a bit more today and she is smiling more.  She is also out of her room more today.  Safety is maintained.

## 2013-04-26 NOTE — [facility] Group Notes (Signed)
[facility] Group Notes:  (Nursing/MHT/Case Management/Adjunct)  Date:  04/26/2013 Time:  12:48 PM  Type of Therapy:  Group Therapy   Participation Level:  None  Participation Quality:  Appropriate  Affect:  Flat  Cognitive:  Appropriate  Insight:  Appropriate  Engagement in Group:  Limited  Modes of Intervention: Discussion, Exploration and Socialization   Summary of Progress/Problems:  The topic for group was balance in life. Pt participated in the discussion about when their life was in balance and out of balance and how this feels. Pt discussed ways to get back in balance and short term goals they can work on to get where they want to be.  Throughout group, Yvonne [name] kept on looking downwards.  Presents as flat and depressed.  Stated that "I know why I'm in here, but I hate that I'm in here."   Identified being with her family to stay balanced, but does not usually reach out to her family members.  Was not able to explain further.  She indicated that she used to hula hoop to stay balanced, but does not do it anymore.    Simona Huh MSW Intern  04/26/2013 12:48 PM

## 2013-04-26 NOTE — BHH Counselor (Signed)
Adult Psychosocial Assessment Update Interdisciplinary Team  Previous [facility] admissions/discharges:  Admissions Discharges  Date: 01/01/13 Date:  Date: Date:  Date: Date:  Date: Date:  Date: Date:   Changes since the last Psychosocial Assessment (including adherence to outpatient mental health and/or substance abuse treatment, situational issues contributing to decompensation and/or relapse). Yvonne [name] states that she has struggled since the death of her sister-in-law earlier this year.  Yvonne [name] went on a shopping spree over the past 6 weeks, and is feeling overwhelmed about her debt.  Her step mother died late last month.   Things deteriorated to the point that she was unable to work at the beginning of the month.  She took time off for bereavement, then found herself too anxious to return to work, and called her sister for help, who suggested she come to the hospital.             Discharge Plan 1. Will you be returning to the same living situation after discharge?   Yes:X  Home No:      If no, what is your plan?           2. Would you like a referral for services when you are discharged? Yes: X  Mental Health    If yes, for what services?  No:              Summary and Recommendations (to be completed by the evaluator) Yvonne [name] is a 47 YO AA female who is here again for treatment of bipolar d/o.  Unlike the last admission in June, she now presents with depressed state. Yvonne [name] has the support of her family.  She can benefit from crises stabilization, medication management, therapeutic milieu and referral for services.                       Signature:  Ida Rogue, 04/26/2013 5:42 PM

## 2013-04-26 NOTE — Progress Notes (Signed)
Patient ID: Yvonne [name], female   DOB: June 11, 1966, 47 y.o.   MRN: [id number]  D: Pt denies SI/HI/AVH/pain. Pt is pleasant. Pt avoids Clinical research associate and forwards little.Pt was in bed upon approach, and did not want to talk, but only answers basic questions and would not elaborate.   A: Pt was offered support and encouragement. Pt was given scheduled medications. Pt was encourage to attend groups. Q 15 minute checks were done for safety.   R:. Pt is taking medication. Pt has no complaints at this time.Pt receptive to treatment and safety maintained on unit.

## 2013-04-26 NOTE — Progress Notes (Signed)
[facility] MD Progress Note  04/26/2013 11:40 AM Yvonne [name]  MRN:  [id number] Subjective:   Patient states "I decided today that I will just have to deal with the consequences of my actions. I want to manage my money better in the future. I will just have to quit shopping. I'm still feeling anxious and depressed about the mess I have created. I would not say that I am Bipolar just need to do better with money."   Objective:  Patient observed in the dayroom watching TV. She remains very anxious, depressed and worrying excessively about her financial situation.  Patient minimizes the symptoms of her Bipolar illness feeling that she can just stop engaging in manic behaviors. Patient has been compliant with her treatment so far and denies any adverse affects.   Diagnosis:   DSM5:  Axis I: Bipolar, Depressed Axis II: Borderline Personality Dis. and Cluster B Traits Axis III:  Past Medical History  Diagnosis Date  . No pertinent past medical history   . Medical history non-contributory   . Mental disorder    Axis IV: occupational problems, other psychosocial or environmental problems, problems related to social environment and problems with primary support group Axis V: 41-50 serious symptoms  ADL's:  Intact  Sleep: Fair  Appetite:  Fair  Suicidal Ideation:  Denies Homicidal Ideation:  Denies AEB (as evidenced by):  Psychiatric Specialty Exam: Review of Systems  Constitutional: Negative.   HENT: Negative.   Eyes: Negative.   Respiratory: Negative.   Cardiovascular: Negative.   Gastrointestinal: Negative.   Genitourinary: Negative.   Musculoskeletal: Negative.   Skin: Negative.   Neurological: Negative.   Endo/Heme/Allergies: Negative.   Psychiatric/Behavioral: Positive for depression. Negative for suicidal ideas, hallucinations, memory loss and substance abuse. The patient is nervous/anxious. The patient does not have insomnia.     Blood pressure 113/81, pulse 102,  temperature 97.5 F (36.4 C), temperature source Oral, resp. rate 16, height 5' 1.02" (1.55 m), weight 60.328 kg (133 lb), SpO2 100.00%.Body mass index is 25.11 kg/(m^2).  General Appearance: Casual  Eye Contact::  Good  Speech:  Clear and Coherent  Volume:  Normal  Mood:  Anxious  Affect:  Congruent  Thought Process:  Goal Directed  Orientation:  Full (Time, Place, and Person)  Thought Content:  Rumination  Suicidal Thoughts:  No  Homicidal Thoughts:  No  Memory:  Immediate;   Good Recent;   Good Remote;   Good  Judgement:  Impaired  Insight:  Fair  Psychomotor Activity:  Increased  Concentration:  Fair  Recall:  Fair  Akathisia:  No  Handed:  Right  AIMS (if indicated):     Assets:  Communication Skills Desire for Improvement Intimacy Leisure Time Physical Health Resilience Social Support  Sleep:  Number of Hours: 6   Current Medications: Current Facility-Administered Medications  Medication Dose Route Frequency Provider Last Rate Last Dose  . acetaminophen (TYLENOL) tablet 650 mg  650 mg Oral Q6H PRN Fransisca Kaufmann, NP      . alum & mag hydroxide-simeth (MAALOX/MYLANTA) 200-200-20 MG/5ML suspension 30 mL  30 mL Oral Q4H PRN Fransisca Kaufmann, NP      . carbamazepine (TEGRETOL) tablet 200 mg  200 mg Oral QHS Mojeed Akintayo   200 mg at 04/25/13 2152  . [START ON 04/27/2013] citalopram (CELEXA) tablet 20 mg  20 mg Oral Daily Fransisca Kaufmann, NP      . hydrOXYzine (ATARAX/VISTARIL) tablet 25 mg  25 mg Oral Q6H PRN Fransisca Kaufmann, NP      .  magnesium hydroxide (MILK OF MAGNESIA) suspension 30 mL  30 mL Oral Daily PRN Fransisca Kaufmann, NP      . OLANZapine zydis (ZYPREXA) disintegrating tablet 10 mg  10 mg Oral Q8H PRN Mojeed Akintayo      . traZODone (DESYREL) tablet 50 mg  50 mg Oral QHS PRN Mojeed Akintayo        Lab Results:  Results for orders placed during the hospital encounter of 04/24/13 (from the past 48 hour(s))  URINE RAPID DRUG SCREEN (HOSP PERFORMED)     Status: None    Collection Time    04/24/13  7:57 PM      Result Value Range   Opiates NONE DETECTED  NONE DETECTED   Cocaine NONE DETECTED  NONE DETECTED   Benzodiazepines NONE DETECTED  NONE DETECTED   Amphetamines NONE DETECTED  NONE DETECTED   Tetrahydrocannabinol NONE DETECTED  NONE DETECTED   Barbiturates NONE DETECTED  NONE DETECTED   Comment:            DRUG SCREEN FOR MEDICAL PURPOSES     ONLY.  IF CONFIRMATION IS NEEDED     FOR ANY PURPOSE, NOTIFY LAB     WITHIN 5 DAYS.                LOWEST DETECTABLE LIMITS     FOR URINE DRUG SCREEN     Drug Class       Cutoff (ng/mL)     Amphetamine      1000     Barbiturate      200     Benzodiazepine   200     Tricyclics       300     Opiates          300     Cocaine          300     THC              50  PREGNANCY, URINE     Status: None   Collection Time    04/24/13  8:08 PM      Result Value Range   Preg Test, Ur NEGATIVE  NEGATIVE   Comment:            THE SENSITIVITY OF THIS     METHODOLOGY IS >20 mIU/mL.  CBC WITH DIFFERENTIAL     Status: None   Collection Time    04/24/13  8:16 PM      Result Value Range   WBC 8.4  4.0 - 10.5 K/uL   RBC 4.76  3.87 - 5.11 MIL/uL   Hemoglobin 13.3  12.0 - 15.0 g/dL   HCT 16.1  09.6 - 04.5 %   MCV 81.3  78.0 - 100.0 fL   MCH 27.9  26.0 - 34.0 pg   MCHC 34.4  30.0 - 36.0 g/dL   RDW 40.9  81.1 - 91.4 %   Platelets 383  150 - 400 K/uL   Neutrophils Relative % 72  43 - 77 %   Neutro Abs 6.1  1.7 - 7.7 K/uL   Lymphocytes Relative 21  12 - 46 %   Lymphs Abs 1.8  0.7 - 4.0 K/uL   Monocytes Relative 7  3 - 12 %   Monocytes Absolute 0.6  0.1 - 1.0 K/uL   Eosinophils Relative 0  0 - 5 %   Eosinophils Absolute 0.0  0.0 - 0.7 K/uL   Basophils Relative 0  0 -  1 %   Basophils Absolute 0.0  0.0 - 0.1 K/uL  COMPREHENSIVE METABOLIC PANEL     Status: Abnormal   Collection Time    04/24/13  8:16 PM      Result Value Range   Sodium 137  135 - 145 mEq/L   Potassium 3.3 (*) 3.5 - 5.1 mEq/L   Chloride 101  96  - 112 mEq/L   CO2 25  19 - 32 mEq/L   Glucose, Bld 98  70 - 99 mg/dL   BUN 12  6 - 23 mg/dL   Creatinine, Ser 1.61  0.50 - 1.10 mg/dL   Calcium 9.8  8.4 - 09.6 mg/dL   Total Protein 7.9  6.0 - 8.3 g/dL   Albumin 3.7  3.5 - 5.2 g/dL   AST 16  0 - 37 U/L   ALT 11  0 - 35 U/L   Alkaline Phosphatase 82  39 - 117 U/L   Total Bilirubin 0.4  0.3 - 1.2 mg/dL   GFR calc non Af Amer >90  >90 mL/min   GFR calc Af Amer >90  >90 mL/min   Comment: (NOTE)     The eGFR has been calculated using the CKD EPI equation.     This calculation has not been validated in all clinical situations.     eGFR's persistently <90 mL/min signify possible Chronic Kidney     Disease.  ETHANOL     Status: None   Collection Time    04/24/13  8:16 PM      Result Value Range   Alcohol, Ethyl (B) <11  0 - 11 mg/dL   Comment:            LOWEST DETECTABLE LIMIT FOR     SERUM ALCOHOL IS 11 mg/dL     FOR MEDICAL PURPOSES ONLY    Physical Findings: AIMS: Facial and Oral Movements Muscles of Facial Expression: None, normal Lips and Perioral Area: None, normal Jaw: None, normal Tongue: None, normal,Extremity Movements Upper (arms, wrists, hands, fingers): None, normal Lower (legs, knees, ankles, toes): None, normal,  , Overall Severity Severity of abnormal movements (highest score from questions above): None, normal Incapacitation due to abnormal movements: None, normal, Dental Status Current problems with teeth and/or dentures?: No Does patient usually wear dentures?: No  CIWA:  CIWA-Ar Total: 0 COWS:     Treatment Plan Summary: Daily contact with patient to assess and evaluate symptoms and progress in treatment Medication management  Plan: Continue crisis management and stabilization.  Medication management: Increase Celexa to 20 mg daily for continued depressive symptoms. Start Vistaril 25 mg every six hours prn symptoms of anxiety.  Encouraged patient to attend groups and participate in group counseling  sessions and activities.  Discharge plan in progress.  Continue current treatment plan.  Address health issues: Vitals reviewed and stable.   Medical Decision Making Problem Points:  Established problem, stable/improving (1) and Review of psycho-social stressors (1) Data Points:  Review of medication regiment & side effects (2)  I certify that inpatient services furnished can reasonably be expected to improve the patient's condition.   Ronda Rajkumar NP-C 04/26/2013, 11:40 AM

## 2013-04-27 MED ORDER — CARBAMAZEPINE ER 200 MG PO CP12
200.0000 mg | ORAL_CAPSULE | Freq: Two times a day (BID) | ORAL | Status: DC
  Administered 2013-04-27 – 2013-05-03 (×11): 200 mg via ORAL
  Filled 2013-04-27 (×14): qty 1

## 2013-04-27 NOTE — [facility] Group Notes (Signed)
[facility] LCSW Aftercare Discharge Planning Group Note   04/27/2013 12:28 PM  Participation Quality:  Minimal  Mood/Affect:  Depressed  Depression Rating:  4  Anxiety Rating:  6  Thoughts of Suicide:  No Will you contract for safety?   NA  Current AVH:  No  Plan for Discharge/Comments:  States she is doing "a bit better" than yesterday.  Nothing else to say.  Continues to present as sad, depressed, withdrawn.    Transportation Means: family  Supports: family  [place], [name]

## 2013-04-27 NOTE — Progress Notes (Signed)
Seen and agreed. Cipriano Millikan, MD 

## 2013-04-27 NOTE — ED Provider Notes (Signed)
Medical screening examination/treatment/procedure(s) were performed by non-physician practitioner and as supervising physician I was immediately available for consultation/collaboration.  Elvan Ebron, MD 04/27/13 1710 

## 2013-04-27 NOTE — Progress Notes (Signed)
Recreation Therapy Notes  Date: 10.17.2014 Time: 9:30am Location: [address] Dayroom  Group Topic: Leisure Education  Goal Area(s) Addresses:  Patient will identify appropriate leisure activity to correspond with letters of the alphabet.  Patient will identify benefit of leisure.   Behavioral Response: Engaged, Appropriate  Intervention: Game  Activity: Leisure ABC's. As a group patients were asked to identify leisure activities to correspond with each letter of the alphabet.   Education:  Leisure Education, Building control surveyor  Education Outcome: Needs additional education  Clinical Observations/Feedback: Patient participated in group session, staring appropriate activities to correspond with letters of the alphabet, such as zumba and yoga. Patient made no additional statements or contributions to group discussion, but appeared to actively listen as she maintained appropriate eye contact with speaker.  Marykay Lex Jaxden Blyden, LRT/CTRS  Jearl Klinefelter 04/27/2013 12:52 PM

## 2013-04-27 NOTE — Progress Notes (Signed)
Patient ID: Yvonne [name], female   DOB: 03/13/1966, 47 y.o.   MRN: [id number] [facility] MD Progress Note  04/27/2013 10:53 AM Florencia Zaccaro  MRN:  [id number] Subjective:  " I did not get a good sleep last night and I am still feeling depressed." Objective: Patient continues to verbalize depressive symptoms, feeling hopeless, anxious and having low energy level and lack of motivation. She reports racing thought and excessive worries about her financial indiscretion. She is compliant with her medications in the hospital and hope to be able to do same when she is discharged.  Diagnosis:   DSM5:  Axis I: Bipolar, Depressed Axis II: Borderline Personality Dis. and Cluster B Traits Axis III:  Past Medical History  Diagnosis Date  . No pertinent past medical history   . Medical history non-contributory   . Mental disorder    Axis IV: occupational problems, other psychosocial or environmental problems, problems related to social environment and problems with primary support group Axis V: 41-50 serious symptoms  ADL's:  Intact  Sleep: Fair  Appetite:  Fair  Suicidal Ideation:  Denies Homicidal Ideation:  Denies AEB (as evidenced by):  Psychiatric Specialty Exam: Review of Systems  Constitutional: Negative.   HENT: Negative.   Eyes: Negative.   Respiratory: Negative.   Cardiovascular: Negative.   Gastrointestinal: Negative.   Genitourinary: Negative.   Musculoskeletal: Negative.   Skin: Negative.   Neurological: Negative.   Endo/Heme/Allergies: Negative.   Psychiatric/Behavioral: Positive for depression. Negative for suicidal ideas, hallucinations, memory loss and substance abuse. The patient is nervous/anxious. The patient does not have insomnia.     Blood pressure 112/77, pulse 111, temperature 98 F (36.7 C), temperature source Oral, resp. rate 16, height 5' 1.02" (1.55 m), weight 60.328 kg (133 lb), SpO2 100.00%.Body mass index is 25.11 kg/(m^2).  General Appearance: Casual   Eye Contact::  Good  Speech:  Clear and Coherent  Volume:  Normal  Mood:  Anxious  Affect:  Congruent  Thought Process:  Goal Directed  Orientation:  Full (Time, Place, and Person)  Thought Content:  Rumination  Suicidal Thoughts:  No  Homicidal Thoughts:  No  Memory:  Immediate;   Good Recent;   Good Remote;   Good  Judgement:  Impaired  Insight:  Fair  Psychomotor Activity:  Increased  Concentration:  Fair  Recall:  Fair  Akathisia:  No  Handed:  Right  AIMS (if indicated):     Assets:  Communication Skills Desire for Improvement Intimacy Leisure Time Physical Health Resilience Social Support  Sleep:  Number of Hours: 6   Current Medications: Current Facility-Administered Medications  Medication Dose Route Frequency Provider Last Rate Last Dose  . acetaminophen (TYLENOL) tablet 650 mg  650 mg Oral Q6H PRN Fransisca Kaufmann, NP      . alum & mag hydroxide-simeth (MAALOX/MYLANTA) 200-200-20 MG/5ML suspension 30 mL  30 mL Oral Q4H PRN Fransisca Kaufmann, NP      . carbamazepine (TEGRETOL) tablet 200 mg  200 mg Oral QHS Dakai Braithwaite   200 mg at 04/26/13 2153  . citalopram (CELEXA) tablet 20 mg  20 mg Oral Daily Fransisca Kaufmann, NP   20 mg at 04/27/13 0809  . hydrOXYzine (ATARAX/VISTARIL) tablet 25 mg  25 mg Oral Q6H PRN Fransisca Kaufmann, NP      . magnesium hydroxide (MILK OF MAGNESIA) suspension 30 mL  30 mL Oral Daily PRN Fransisca Kaufmann, NP   30 mL at 04/27/13 0810  . OLANZapine zydis (ZYPREXA) disintegrating tablet 10 mg  10 mg Oral Q8H PRN Cherrise Occhipinti      . traZODone (DESYREL) tablet 50 mg  50 mg Oral QHS PRN Kamrin Spath        Lab Results:  No results found for this or any previous visit (from the past 48 hour(s)).  Physical Findings: AIMS: Facial and Oral Movements Muscles of Facial Expression: None, normal Lips and Perioral Area: None, normal Jaw: None, normal Tongue: None, normal,Extremity Movements Upper (arms, wrists, hands, fingers): None, normal Lower (legs, knees,  ankles, toes): None, normal,  , Overall Severity Severity of abnormal movements (highest score from questions above): None, normal Incapacitation due to abnormal movements: None, normal, Dental Status Current problems with teeth and/or dentures?: No Does patient usually wear dentures?: No  CIWA:  CIWA-Ar Total: 0 COWS:     Treatment Plan Summary: Daily contact with patient to assess and evaluate symptoms and progress in treatment Medication management  Plan: Continue crisis management and stabilization.  Medication management: Continue Celexa 20 mg daily for  depressive symptoms. Continue Vistaril 25 mg every six hours prn symptoms of anxiety.  Discontinue Tegretol 200mg  po Qhs. Initiate Carbamazepine ER 200mg  po BID for bipolar disorder. Encouraged patient to attend groups and participate in group counseling sessions and activities.  Discharge plan in progress.  Continue current treatment plan.  Address health issues: Vitals reviewed and stable.   Medical Decision Making Problem Points:  Established problem, improving (1) and Review of psycho-social stressors (1) Data Points:  Review of medication regiment & side effects (2)  I certify that inpatient services furnished can reasonably be expected to improve the patient's condition.   Thedore Mins, MD 04/27/2013, 10:53 AM

## 2013-04-27 NOTE — Progress Notes (Signed)
Psychoeducational Group Note  Date:  04/27/2013 Time:  2000  Group Topic/Focus:  Wrap-Up Group:   The focus of this group is to help patients review their daily goal of treatment and discuss progress on daily workbooks.  Participation Level: Did Not Attend  Participation Quality:  Not Applicable  Affect:  Not Applicable  Cognitive:  Not Applicable  Insight:  Not Applicable  Engagement in Group: Not Applicable  Additional Comments:  The patient did not attend group since she was sleeping in her bed.   Makinsey Pepitone S 04/27/2013, 11:08 PM

## 2013-04-27 NOTE — [facility] Group Notes (Signed)
[facility] LCSW Group Therapy  04/27/2013  1:05 PM  Type of Therapy:  Group therapy  Participation Level:Minimal  Participation Quality:  Attentive  Affect:  Flat  Cognitive:  Oriented  Insight:  Limited  Engagement in Therapy:  Limited  Modes of Intervention:  Discussion, Socialization  Summary of Progress/Problems:  Chaplain was here to lead a group on themes of hope and courage. Initially declined to share.  Later revealed that she has never had many friends, and has mainly been with family only.  Sounds like she had some regrets, but did not wish to expain further. Daryel Gerald B 04/27/2013 1:17 PM

## 2013-04-27 NOTE — Progress Notes (Signed)
Patient ID: Yvonne [name], female   DOB: Apr 20, 1966, 47 y.o.   MRN: [id number] D. Patient presents with depressed mood, blunted affect, patient not forthcoming with thoughts to Clinical research associate. Patient states '' I didn't sleep last night. I'm feeling the same. Still depressed '' Patient declined to complete self inventory, and noted with minimal interaction with peers, isolative to room throughout most of shift. A. Medications given as ordered. Support and encouragement provided. R. Patient remains isolative, withdrawn. No acute distress noted, or further voiced concerns. Will continue to monitor q 15 minutes for safety.

## 2013-04-27 NOTE — [facility] Suicide Risk Assessment (Signed)
[facility] INPATIENT:  Family/Significant Other Suicide Prevention Education  Suicide Prevention Education:  Education Completed; No one has been identified by the patient as the family member/significant other with whom the patient will be residing, and identified as the person(s) who will aid the patient in the event of a mental health crisis (suicidal ideations/suicide attempt).  With written consent from the patient, the family member/significant other has been provided the following suicide prevention education, prior to the and/or following the discharge of the patient.  The suicide prevention education provided includes the following:  Suicide risk factors  Suicide prevention and interventions  National Suicide Hotline telephone number  [facility] assessment telephone number  [facility] Emergency Assistance 911  [facility] and/or Residential Mobile Crisis Unit telephone number  Request made of family/significant other to:  Remove weapons (e.g., guns, rifles, knives), all items previously/currently identified as safety concern.    Remove drugs/medications (over-the-counter, prescriptions, illicit drugs), all items previously/currently identified as a safety concern.  The family member/significant other verbalizes understanding of the suicide prevention education information provided.  The family member/significant other agrees to remove the items of safety concern listed above.  The patient did not endorse SI at the time of admission, nor did the patient c/o SI during the stay here.  SPE not required.    Daryel Gerald B 04/27/2013, 8:21 AM

## 2013-04-28 DIAGNOSIS — F411 Generalized anxiety disorder: Secondary | ICD-10-CM

## 2013-04-28 NOTE — Progress Notes (Signed)
Patient ID: Yvonne [name], female   DOB: Feb 19, 1966, 47 y.o.   MRN: [id number] D. Patient presents with depressed mood , affect congruent. She continues to be isolative, quiet and withdrawn throughout shift thus far. Patient not forthcoming of thoughts to writer, forwarding little upon interaction. She reports that '' I feel the same '' Patient did complete self inventory and rates sleep as fair, and rates depression at 2/10 on depression scale 10 being worst depression, 1 being least. She denies any SI/HI at this time. A. Support and encouragement provided. Medications given as ordered. R. Will continue to  Monitor q 15 minutes for safety.

## 2013-04-28 NOTE — Progress Notes (Signed)
Patient ID: Yvonne [name], female   DOB: 10-Nov-1965, 47 y.o.   MRN: [id number] D. The patient spent the evening resting in bed with eyes closed. A. Attempt made to encourage to come to evening wrap up group. R. The patient remained sleeping through out the evening with no interaction in the milieu.

## 2013-04-28 NOTE — [facility] Group Notes (Signed)
[facility] Group Notes:  (Nursing/MHT/Case Management/Adjunct)    Date:  04/28/2013  Time:  0930  Type of Therapy:  Psychoeducational Skills  Participation Level:  Did Not Attend  Participation Quality:  na  Affect:  na  Cognitive:  na  Insight:  None  Engagement in Group:  na  Modes of Intervention:  na  Summary of Progress/Problems: did not attend group despite encouragement from staff. Malva Limes 04/28/2013, 12:21 PM

## 2013-04-28 NOTE — Progress Notes (Signed)
Patient ID: Yvonne [name], female   DOB: Aug 23, 1965, 47 y.o.   MRN: [id number] D. The patient rested in bed with eyes closed all evening. A. Attempt made to  Encourage patient to attend evening wrap up group. R. The patient did not attend group. Remained in bed all evening.

## 2013-04-28 NOTE — Progress Notes (Signed)
Psychoeducational Group Note  Date:  04/28/2013 Time:  2000  Group Topic/Focus:  Wrap-Up Group:   The focus of this group is to help patients review their daily goal of treatment and discuss progress on daily workbooks.  Participation Level: Did Not Attend  Participation Quality:  Not Applicable  Affect:  Not Applicable  Cognitive:  Not Applicable  Insight:  Not Applicable  Engagement in Group: Not Applicable  Additional Comments:  The patient did not attend group this evening since she elected to return to sleep despite this author's encouragement.   Cavin Longman S 04/28/2013, 9:08 PM

## 2013-04-28 NOTE — Progress Notes (Signed)
[facility] MD Progress Note  04/28/2013 11:14 AM Yvonne [name]  MRN:  [id number] Subjective:  Patient appeared sleepy, stated her sleep was "better last night", forwards little.  Depressed mood and congruent affect, she was sitting in the dayroom with her head in her hands.  Anxiety high with suicidal ideations, denies hallucinations and physical discomforts, appetite is fair--complains of not liking the food, encouraged her to ask for other choice if she does not like the menu. Diagnosis:   DSM5:  Axis I: Anxiety Disorder NOS and Bipolar, Depressed Axis II: Deferred Axis III:  Past Medical History  Diagnosis Date  . No pertinent past medical history   . Medical history non-contributory   . Mental disorder    Axis IV: other psychosocial or environmental problems, problems related to social environment and problems with primary support group Axis V: 41-50 serious symptoms  ADL's:  Intact  Sleep: Fair  Appetite:  Fair  Suicidal Ideation:  Plan:  overdose Intent:  yes Means:  none Homicidal Ideation:  Denies  Psychiatric Specialty Exam: Review of Systems  Constitutional: Negative.   HENT: Negative.   Eyes: Negative.   Respiratory: Negative.   Cardiovascular: Negative.   Gastrointestinal: Negative.   Genitourinary: Negative.   Musculoskeletal: Negative.   Skin: Negative.   Neurological: Negative.   Endo/Heme/Allergies: Negative.   Psychiatric/Behavioral: Positive for depression and suicidal ideas. The patient is nervous/anxious.     Blood pressure 99/73, pulse 100, temperature 97.6 F (36.4 C), temperature source Oral, resp. rate 18, height 5' 1.02" (1.55 m), weight 60.328 kg (133 lb), SpO2 100.00%.Body mass index is 25.11 kg/(m^2).  General Appearance: Disheveled  Eye Contact::  Minimal  Speech:  Slow  Volume:  Decreased  Mood:  Anxious and Depressed  Affect:  Congruent  Thought Process:  Coherent  Orientation:  Full (Time, Place, and Person)  Thought Content:  WDL   Suicidal Thoughts:  Yes.  with intent/plan  Homicidal Thoughts:  No  Memory:  Immediate;   Fair Recent;   Fair Remote;   Fair  Judgement:  Fair  Insight:  Lacking  Psychomotor Activity:  Decreased  Concentration:  Fair  Recall:  Fair  Akathisia:  No  Handed:  Right  AIMS (if indicated):     Assets:  Resilience  Sleep:  Number of Hours: 6.5   Current Medications: Current Facility-Administered Medications  Medication Dose Route Frequency Provider Last Rate Last Dose  . acetaminophen (TYLENOL) tablet 650 mg  650 mg Oral Q6H PRN Fransisca Kaufmann, NP      . alum & mag hydroxide-simeth (MAALOX/MYLANTA) 200-200-20 MG/5ML suspension 30 mL  30 mL Oral Q4H PRN Fransisca Kaufmann, NP      . carbamazepine (EQUETRO) 12 hr capsule 200 mg  200 mg Oral BID Mojeed Akintayo   200 mg at 04/28/13 0826  . citalopram (CELEXA) tablet 20 mg  20 mg Oral Daily Fransisca Kaufmann, NP   20 mg at 04/28/13 1478  . hydrOXYzine (ATARAX/VISTARIL) tablet 25 mg  25 mg Oral Q6H PRN Fransisca Kaufmann, NP      . magnesium hydroxide (MILK OF MAGNESIA) suspension 30 mL  30 mL Oral Daily PRN Fransisca Kaufmann, NP   30 mL at 04/27/13 0810  . OLANZapine zydis (ZYPREXA) disintegrating tablet 10 mg  10 mg Oral Q8H PRN Mojeed Akintayo      . traZODone (DESYREL) tablet 50 mg  50 mg Oral QHS PRN Mojeed Akintayo        Lab Results: No results found for this  or any previous visit (from the past 48 hour(s)).  Physical Findings: AIMS: Facial and Oral Movements Muscles of Facial Expression: None, normal Lips and Perioral Area: None, normal Jaw: None, normal Tongue: None, normal,Extremity Movements Upper (arms, wrists, hands, fingers): None, normal Lower (legs, knees, ankles, toes): None, normal,  , Overall Severity Severity of abnormal movements (highest score from questions above): None, normal Incapacitation due to abnormal movements: None, normal, Dental Status Current problems with teeth and/or dentures?: No Does patient usually wear dentures?: No   CIWA:  CIWA-Ar Total: 0 COWS:     Treatment Plan Summary: Daily contact with patient to assess and evaluate symptoms and progress in treatment Medication management  Plan:  Review of chart, vital signs, medications, and notes. 1-Individual and group therapy 2-Medication management for depression and anxiety:  Medications reviewed with the patient and she stated no untoward effects, no changes made 3-Coping skills for depression and anxiety 4-Continue crisis stabilization and management 5-Address health issues--monitoring vital signs, stable 6-Treatment plan in progress to prevent relapse of depression and anxiety   Medical Decision Making Problem Points:  Established problem, stable/improving (1) and Review of psycho-social stressors (1) Data Points:  Review of medication regiment & side effects (2)  I certify that inpatient services furnished can reasonably be expected to improve the patient's condition.   [name], PMH-NP 04/28/2013, 11:14 AM Agree with assessment and plan Madie Reno A. Dub Mikes, M.D.

## 2013-04-28 NOTE — [facility] Group Notes (Signed)
[facility] Group Notes:  (Clinical Social Work)  04/28/2013  11:15-11:45AM  Summary of Progress/Problems:   The main focus of today's process group was for the patient to identify ways in which they have in the past sabotaged their own recovery and reasons they may have done this/what they received from doing it.  We then worked to identify a specific plan to avoid doing this when discharged from the hospital for this admission.  The patient expressed only that she did not want to talk, but rather just to listen.  Type of Therapy:  Group Therapy - Process  Participation Level:  Minimal  Participation Quality:  Inattentive  Affect:  Flat  Cognitive:  Oriented  Insight:  None  Engagement in Therapy:  None  Modes of Intervention:  Clarification, Education, Exploration, Discussion  Ambrose Mantle, LCSW 04/28/2013, 1:07 PM

## 2013-04-29 MED ORDER — ARIPIPRAZOLE 2 MG PO TABS
2.0000 mg | ORAL_TABLET | Freq: Every day | ORAL | Status: DC
Start: 2013-04-29 — End: 2013-04-30
  Filled 2013-04-29 (×4): qty 1

## 2013-04-29 NOTE — Progress Notes (Signed)
Psychoeducational Group Note  Date:  04/29/2013 Time: 2000  Group Topic/Focus:  Wrap-Up Group:   The focus of this group is to help patients review their daily goal of treatment and discuss progress on daily workbooks.  Participation Level: Did Not Attend  Participation Quality:  Not Applicable  Affect:  Not Applicable  Cognitive:  Not Applicable  Insight:  Not Applicable  Engagement in Group: Not Applicable  Additional Comments:  The patient refused to attend group this evening and then pulled the covers over her head.   Hazle Coca S 04/29/2013, 10:41 PM

## 2013-04-29 NOTE — [facility] Group Notes (Signed)
[facility] Group Notes:  (Nursing/MHT/Case Management/Adjunct)  Date:  04/29/2013   Time: 0900  Type of Therapy: Psychoeducational Skills  Participation Level: Did Not Attend  Participation Quality: na  Affect: na  Cognitive: na  Insight: None  Engagement in Group: na  Modes of Intervention: na  Summary of Progress/Problems: did not attend group despite encouragement from staff.  Malva Limes 04/29/2013, 10:31 AM

## 2013-04-29 NOTE — [facility] Group Notes (Signed)
[facility] Group Notes:  (Clinical Social Work)  04/29/2013   11:15am-12:00pm  Summary of Progress/Problems:  The main focus of today's process group was to listen to a variety of genres of music and to identify that different types of music provoke different responses.  The patient then was able to identify personally what was soothing for them, as well as energizing.  Handouts were used to record feelings evoked, as well as how patient can personally use this knowledge in sleep habits, with depression, and with other symptoms.  The patient expressed understanding of concepts, as well as knowledge of how each type of music affected them and how this can be used when they are at home as a tool in their recovery.  Although the patient stated at the beginning of group that she only wanted to listen and not to participate directly, she ended up talking about her feelings very appropriately after each type of music played.  Type of Therapy:  Music Therapy   Participation Level:  Active  Participation Quality:  Attentive and Sharing  Affect:  Blunted  Cognitive:  Oriented  Insight:  Engaged  Engagement in Therapy:  Engaged  Modes of Intervention:   Activity, Exploration  Ambrose Mantle, LCSW 04/29/2013, 12:50 PM

## 2013-04-29 NOTE — Progress Notes (Signed)
Patient resting quietly with eyes closed. Respirations even and unlabored. No distress noted . Q 15 minute check continues as ordered to maintain safety. 

## 2013-04-29 NOTE — Progress Notes (Signed)
Patient ID: Yvonne [name], female   DOB: 10/28/1965, 46 y.o.   MRN: [id number] D. Patient presents with depressed mood, affect irritable today. Patient in am refusing to get out of bed, or complete self inventory for RN stating '' I don't want to do any of that today. I don't feel like it. '' Pt then pulled covers over face and rolled over. A. Support and encouragement provided. Offered encouragement to attend group and patient refused. Medications given. Discussed above information with MD. R. Will continue to monitor q 15 minutes for safety.

## 2013-04-29 NOTE — Progress Notes (Signed)
[facility] MD Progress Note  04/29/2013 1:18 PM Yvonne [name]  MRN:  [id number] Subjective:  Patient has increased psychomotor retardation, very slow to respond, forwards little, head low, no eye contact, when I asked about her depression--"I don't know." Diagnosis:   DSM5:  Axis I: Bipolar, Depressed Axis II: Deferred Axis III:  Past Medical History  Diagnosis Date  . No pertinent past medical history   . Medical history non-contributory   . Mental disorder    Axis IV: other psychosocial or environmental problems, problems related to social environment and problems with primary support group Axis V: 41-50 serious symptoms  ADL's:  Intact  Sleep: Good  Appetite:  Fair  Suicidal Ideation:  Would not answer Homicidal Ideation:  Denies  Psychiatric Specialty Exam: Review of Systems  Constitutional: Negative.   HENT: Negative.   Eyes: Negative.   Respiratory: Negative.   Cardiovascular: Negative.   Gastrointestinal: Negative.   Genitourinary: Negative.   Musculoskeletal: Negative.   Skin: Negative.   Neurological: Negative.   Endo/Heme/Allergies: Negative.   Psychiatric/Behavioral: Positive for depression and suicidal ideas. The patient is nervous/anxious.     Blood pressure 117/67, pulse 86, temperature 97.7 F (36.5 C), temperature source Oral, resp. rate 16, height 5' 1.02" (1.55 m), weight 60.328 kg (133 lb), SpO2 100.00%.Body mass index is 25.11 kg/(m^2).  General Appearance: Casual  Eye Contact::  Fair  Speech:  Slow  Volume:  Decreased  Mood:  Depressed  Affect:  Flat  Thought Process:  Coherent  Orientation:  Full (Time, Place, and Person)  Thought Content:  WDL  Suicidal Thoughts:  Refused to answer  Homicidal Thoughts:  No  Memory:  Immediate;   Fair Recent;   Fair Remote;   Fair  Judgement:  Poor  Insight:  Lacking  Psychomotor Activity:  Decreased  Concentration:  Poor  Recall:  Fair  Akathisia:  No  Handed:  Right  AIMS (if indicated):      Assets:  Physical Health  Sleep:  Number of Hours: 6.5   Current Medications: Current Facility-Administered Medications  Medication Dose Route Frequency Provider Last Rate Last Dose  . acetaminophen (TYLENOL) tablet 650 mg  650 mg Oral Q6H PRN Fransisca Kaufmann, NP      . alum & mag hydroxide-simeth (MAALOX/MYLANTA) 200-200-20 MG/5ML suspension 30 mL  30 mL Oral Q4H PRN Fransisca Kaufmann, NP      . carbamazepine (EQUETRO) 12 hr capsule 200 mg  200 mg Oral BID Mojeed Akintayo   200 mg at 04/29/13 0818  . citalopram (CELEXA) tablet 20 mg  20 mg Oral Daily Fransisca Kaufmann, NP   20 mg at 04/29/13 1478  . hydrOXYzine (ATARAX/VISTARIL) tablet 25 mg  25 mg Oral Q6H PRN Fransisca Kaufmann, NP      . magnesium hydroxide (MILK OF MAGNESIA) suspension 30 mL  30 mL Oral Daily PRN Fransisca Kaufmann, NP   30 mL at 04/27/13 0810  . OLANZapine zydis (ZYPREXA) disintegrating tablet 10 mg  10 mg Oral Q8H PRN Mojeed Akintayo      . traZODone (DESYREL) tablet 50 mg  50 mg Oral QHS PRN Mojeed Akintayo        Lab Results: No results found for this or any previous visit (from the past 48 hour(s)).  Physical Findings: AIMS: Facial and Oral Movements Muscles of Facial Expression: None, normal Lips and Perioral Area: None, normal Jaw: None, normal Tongue: None, normal,Extremity Movements Upper (arms, wrists, hands, fingers): None, normal Lower (legs, knees, ankles, toes): None, normal,  ,  Overall Severity Severity of abnormal movements (highest score from questions above): None, normal Incapacitation due to abnormal movements: None, normal, Dental Status Current problems with teeth and/or dentures?: No Does patient usually wear dentures?: No  CIWA:  CIWA-Ar Total: 0 COWS:     Treatment Plan Summary: Daily contact with patient to assess and evaluate symptoms and progress in treatment Medication management  Plan:  Review of chart, vital signs, medications, and notes. 1-Individual and group therapy 2-Medication management for  depression and anxiety:  Medications reviewed with the patient and she stated no untoward effects 3-Coping skills for depression, anxiety 4-Continue crisis stabilization and management 5-Address health issues--monitoring vital signs, stable 6-Treatment plan in progress to prevent relapse of depression and anxiety  Medical Decision Making Problem Points:  Established problem, stable/improving (1) and Review of psycho-social stressors (1) Data Points:  Review of medication regiment & side effects (2)  I certify that inpatient services furnished can reasonably be expected to improve the patient's condition.   [name], PMH-NP 04/29/2013, 1:18 PM Agree with assessment and plan Madie Reno A. Dub Mikes, M.D.

## 2013-04-30 MED ORDER — TRAZODONE HCL 50 MG PO TABS
75.0000 mg | ORAL_TABLET | Freq: Every evening | ORAL | Status: DC | PRN
  Filled 2013-04-30: qty 5

## 2013-04-30 MED ORDER — CITALOPRAM HYDROBROMIDE 10 MG PO TABS
30.0000 mg | ORAL_TABLET | Freq: Every day | ORAL | Status: DC
  Administered 2013-05-01 – 2013-05-04 (×4): 30 mg via ORAL
  Filled 2013-04-30: qty 3
  Filled 2013-04-30: qty 9
  Filled 2013-04-30 (×4): qty 3

## 2013-04-30 NOTE — Progress Notes (Signed)
Recreation Therapy Notes  Date: 10.20.2014 Time: 9:30am Location: [address] Dayroom   Group Topic: Coping Skills  Goal Area(s) Addresses:  Patient will express themselves through the use of art.  Patient will identify what positive changes have been made in their lives.   Behavioral Response: Disengaged, Sleeping  Intervention: Art  Activity: Two Faces of Me. Patient was asked to depict themselves at admission and at d/c in drawing.    Education: Discharge Planning.    Education Outcome: Needs additional education.   Clinical Observations/Feedback: Patient attended group, but refused to participate in activity. Patient made no statements or contributions during group session.   Marykay Lex Vercie Pokorny, LRT/CTRS  Westlynn Fifer L 04/30/2013 11:53 AM

## 2013-04-30 NOTE — Progress Notes (Signed)
Patient ID: Yvonne [name], female   DOB: 05-19-1966, 47 y.o.   MRN: [id number] [facility] MD Progress Note  04/30/2013 10:39 AM Lachlan Pelto  MRN:  [id number] Subjective:  " I am still having difficulty sleeping and feeling depressed." Objective: Patient reports low energy level, lack of motivation, excessive worries, anxiety and depressive symptoms.  She reports decreased racing thoughts but still having difficulty sleeping. She is compliant with her medications and has not endorsed any adverse reactions. Diagnosis:   DSM5:  Axis I: Bipolar 1 disorder recent episode depressed            Anxiety disorder, NOS Axis II: Borderline Personality Dis. and Cluster B Traits Axis III:  Past Medical History  Diagnosis Date  . No pertinent past medical history   . Medical history non-contributory   . Mental disorder    Axis IV: occupational problems, other psychosocial or environmental problems, problems related to social environment and problems with primary support group Axis V: 41-50 serious symptoms  ADL's:  Intact  Sleep: Fair  Appetite:  Fair  Suicidal Ideation:  Denies Homicidal Ideation:  Denies AEB (as evidenced by):  Psychiatric Specialty Exam: Review of Systems  Constitutional: Negative.   HENT: Negative.   Eyes: Negative.   Respiratory: Negative.   Cardiovascular: Negative.   Gastrointestinal: Negative.   Genitourinary: Negative.   Musculoskeletal: Negative.   Skin: Negative.   Neurological: Negative.   Endo/Heme/Allergies: Negative.   Psychiatric/Behavioral: Positive for depression. Negative for suicidal ideas, hallucinations, memory loss and substance abuse. The patient is nervous/anxious. The patient does not have insomnia.     Blood pressure 123/79, pulse 116, temperature 96.6 F (35.9 C), temperature source Oral, resp. rate 16, height 5' 1.02" (1.55 m), weight 60.328 kg (133 lb), SpO2 100.00%.Body mass index is 25.11 kg/(m^2).  General Appearance: Casual  Eye  Contact::  Good  Speech:  Clear and Coherent  Volume:  Normal  Mood:  Anxious, depressed  Affect:  Congruent  Thought Process:  Goal Directed  Orientation:  Full (Time, Place, and Person)  Thought Content:  Rumination  Suicidal Thoughts:  No  Homicidal Thoughts:  No  Memory:  Immediate;   Good Recent;   Good Remote;   Good  Judgement:  Impaired  Insight:  Fair  Psychomotor Activity:  Increased  Concentration:  Fair  Recall:  Fair  Akathisia:  No  Handed:  Right  AIMS (if indicated):     Assets:  Communication Skills Desire for Improvement Intimacy Leisure Time Physical Health Resilience Social Support  Sleep:  Number of Hours: 4   Current Medications: Current Facility-Administered Medications  Medication Dose Route Frequency Provider Last Rate Last Dose  . acetaminophen (TYLENOL) tablet 650 mg  650 mg Oral Q6H PRN Fransisca Kaufmann, NP      . alum & mag hydroxide-simeth (MAALOX/MYLANTA) 200-200-20 MG/5ML suspension 30 mL  30 mL Oral Q4H PRN Fransisca Kaufmann, NP      . carbamazepine (EQUETRO) 12 hr capsule 200 mg  200 mg Oral BID [name], NP   200 mg at 04/30/13 0803  . [START ON 05/01/2013] citalopram (CELEXA) tablet 30 mg  30 mg Oral Daily Ilisha Blust      . hydrOXYzine (ATARAX/VISTARIL) tablet 25 mg  25 mg Oral Q6H PRN Fransisca Kaufmann, NP      . magnesium hydroxide (MILK OF MAGNESIA) suspension 30 mL  30 mL Oral Daily PRN Fransisca Kaufmann, NP   30 mL at 04/27/13 0810  . traZODone (DESYREL) tablet 50 mg  50 mg Oral QHS PRN Nachmen Mansel        Lab Results:  No results found for this or any previous visit (from the past 48 hour(s)).  Physical Findings: AIMS: Facial and Oral Movements Muscles of Facial Expression: None, normal Lips and Perioral Area: None, normal Jaw: None, normal Tongue: None, normal,Extremity Movements Upper (arms, wrists, hands, fingers): None, normal Lower (legs, knees, ankles, toes): None, normal,  , Overall Severity Severity of abnormal movements  (highest score from questions above): None, normal Incapacitation due to abnormal movements: None, normal, Dental Status Current problems with teeth and/or dentures?: No Does patient usually wear dentures?: No  CIWA:  CIWA-Ar Total: 0 COWS:     Treatment Plan Summary: Daily contact with patient to assess and evaluate symptoms and progress in treatment Medication management  Plan: Continue crisis management and stabilization.  Medication management: Increase Celexa to 30 mg daily for  depressive symptoms. Continue Vistaril 25 mg every six hours prn symptoms of anxiety.  Continue Carbamazepine ER 200mg  po BID for bipolar disorder. Increase Trazodone 75mg  po Qhs as needed for Insomnia Encouraged patient to attend groups and participate in group counseling sessions and activities.  Discharge plan in progress.  Continue current treatment plan.  Address health issues: Vitals reviewed and stable.  Medical Decision Making Problem Points:  Established problem, improving(1) and Review of psycho-social stressors (1) Data Points:  Review of medication regiment & side effects (2)  I certify that inpatient services furnished can reasonably be expected to improve the patient's condition.   Thedore Mins, MD 04/30/2013, 10:39 AM

## 2013-04-30 NOTE — [facility] Group Notes (Signed)
[facility] LCSW Group Therapy  04/30/2013 1:15 pm  Type of Therapy: Process Group Therapy  Participation Level:  Minimal  Participation Quality:  Appropriate  Affect:  Flat  Cognitive:  Oriented  Insight:  Improving  Engagement in Group:  Limited  Engagement in Therapy:  Limited  Modes of Intervention:  Activity, Clarification, Education, Problem-solving and Support  Summary of Progress/Problems: Today's group addressed the issue of overcoming obstacles.  Patients were asked to identify their biggest obstacle post d/c that stands in the way of their on-going success, and then problem solve as to how to manage this.  Lauriana stated her obstacle is herself, and was reluctant to say more.  When pushed, she was able to admit that she feels like she really messed up.  Is stuck in not being able to forgive herself.  Was encouraged by other group members, and one group member stated she would "hold hope and strength for her while she feels like she has none."  She left group early.  Daryel Gerald B 04/30/2013   4:04 PM

## 2013-04-30 NOTE — Progress Notes (Signed)
Adult Psychoeducational Group Note  Date:  04/30/2013 Time:  2:35 PM  Group Topic/Focus:  Dimensions of Wellness:   The focus of this group is to introduce the topic of wellness and discuss the role each dimension of wellness plays in total health.  Participation Level:  None  Participation Quality:  Appropriate  Affect:  Blunted  Cognitive:  Alert and Appropriate  Insight: Good  Engagement in Group:  None  Modes of Intervention:  Discussion and Support  Additional Comments:  Patient came to group but did not share nor talk.  Yvonne [name] 04/30/2013, 2:35 PM

## 2013-04-30 NOTE — Progress Notes (Signed)
D: Patient in bed on approach.  Patient states she is ok.  Patient states she does not need anything.  Patient forwards little with Clinical research associate.  Patient denies SI/HI and denies AVH.   A: Staff to monitor Q 15 mins for safety.  Encouragement and support offered.  Scheduled medications administered per orders. R: Patient remains safe on the unit.  Patient did not attend group tonight.  Patient had no scheduled medications tonight.  Patient not visible on the unit tonight and not interacting with peers.

## 2013-04-30 NOTE — Progress Notes (Signed)
Patient ID: Yvonne [name], female   DOB: March 22, 1966, 47 y.o.   MRN: [id number]  D: Pt. appears with depressed mood and flat affect. Pt. denies SI/HI and A/V hallucinations. Pt. does not report any pain or discomfort at this time. Pt. is minimal when speaking to writer but refuses to take Abilify and has reported this to the physician. When inquired about why she will not take Abilify patient stated, "I guess because of how my son reacted." Pt. reports depression and hopelessness at a 1/10 (1-10 scale) today. Patient reports that she will be going to group today.   A: Encouragement and support given to patient by Clinical research associate.  R: Pt. Is receptive and cooperative even though she is minimal when speaking. Q15 minute checks are maintained for safety.

## 2013-05-01 NOTE — Progress Notes (Signed)
Adult Psychoeducational Group Note  Date:  05/01/2013 Time:  8:00 pm  Group Topic/Focus:  Wrap-Up Group:   The focus of this group is to help patients review their daily goal of treatment and discuss progress on daily workbooks.  Participation Level:  Did Not Attend  Yvonne [name] 05/01/2013, 10:30 PM

## 2013-05-01 NOTE — Progress Notes (Signed)
Patient ID: Yvonne [name], female   DOB: 17-Nov-1965, 47 y.o.   MRN: [id number]  D: Patient denies SI/HI and A/V hallucinations. Patient denies pain and discomfort at this time. Patient appears slightly disheveled with depressed mood and flat affect. Patient is minimal when speaking with Clinical research associate. Pt. engages only minimally when writer asks questions that are pertinent to patient's treatment. Patient is irritable when speaking with Clinical research associate. Patient refused to do her daily inventory sheet.  Patient had refused her 1700 medication and states that she will not be going to dinner.   A: Support and encouragement were offered to patient to engage in conversation with Clinical research associate and to do daily inventory sheet. Writer offered to go over patient's daily information sheet if patient provided the answers to the questions. Writer inquired if patient felt sick and that's why she is not going to dinner. Patient stated, "I'm just not going to go."  R: Patient refused writer's offer for help with daily inventory sheet.  Patient continues to be evasive and irritable. Q15 minute safety checks are maintained and patient will continue to be monitored.
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
## 2013-05-01 NOTE — Progress Notes (Signed)
Patient ID: Yvonne [name], female   DOB: October 07, 1965, 47 y.o.   MRN: [id number] [facility] MD Progress Note  05/01/2013 10:19 AM Briseida Gittings  MRN:  [id number] Subjective:  " I am feeling anxious and depressed." Objective: Patient continues to report feeling anxious, hopeless, depressed,  having excessive worries,  Low energy level and  lack of motivation. However, she slept better last night and has decreased racing thoughts. She is compliant with her medications and has not endorsed any adverse reactions. Diagnosis:   DSM5:  Axis I: Bipolar 1 disorder recent episode depressed            Anxiety disorder, NOS Axis II: Borderline Personality Dis. and Cluster B Traits Axis III:  Past Medical History  Diagnosis Date  . No pertinent past medical history   . Medical history non-contributory   . Mental disorder    Axis IV: occupational problems, other psychosocial or environmental problems, problems related to social environment and problems with primary support group Axis V: 41-50 serious symptoms  ADL's:  Intact  Sleep: Fair  Appetite:  Fair  Suicidal Ideation:  Denies Homicidal Ideation:  Denies AEB (as evidenced by):  Psychiatric Specialty Exam: Review of Systems  Constitutional: Negative.   HENT: Negative.   Eyes: Negative.   Respiratory: Negative.   Cardiovascular: Negative.   Gastrointestinal: Negative.   Genitourinary: Negative.   Musculoskeletal: Negative.   Skin: Negative.   Neurological: Negative.   Endo/Heme/Allergies: Negative.   Psychiatric/Behavioral: Positive for depression. Negative for suicidal ideas, hallucinations, memory loss and substance abuse. The patient is nervous/anxious. The patient does not have insomnia.     Blood pressure 107/79, pulse 106, temperature 98.1 F (36.7 C), temperature source Oral, resp. rate 18, height 5' 1.02" (1.55 m), weight 60.328 kg (133 lb), SpO2 100.00%.Body mass index is 25.11 kg/(m^2).  General Appearance: Casual  Eye  Contact::  Good  Speech:  Clear and Coherent  Volume:  Normal  Mood:  Anxious, depressed  Affect:  Congruent  Thought Process:  Goal Directed  Orientation:  Full (Time, Place, and Person)  Thought Content:  Rumination  Suicidal Thoughts:  No  Homicidal Thoughts:  No  Memory:  Immediate;   Good Recent;   Good Remote;   Good  Judgement:  Impaired  Insight:  Fair  Psychomotor Activity:  Increased  Concentration:  Fair  Recall:  Fair  Akathisia:  No  Handed:  Right  AIMS (if indicated):     Assets:  Communication Skills Desire for Improvement Intimacy Leisure Time Physical Health Resilience Social Support  Sleep:  Number of Hours: 5.75   Current Medications: Current Facility-Administered Medications  Medication Dose Route Frequency Provider Last Rate Last Dose  . acetaminophen (TYLENOL) tablet 650 mg  650 mg Oral Q6H PRN Fransisca Kaufmann, NP      . alum & mag hydroxide-simeth (MAALOX/MYLANTA) 200-200-20 MG/5ML suspension 30 mL  30 mL Oral Q4H PRN Fransisca Kaufmann, NP      . carbamazepine (EQUETRO) 12 hr capsule 200 mg  200 mg Oral BID Braxden Lovering   200 mg at 05/01/13 0759  . citalopram (CELEXA) tablet 30 mg  30 mg Oral Daily Bronwen Pendergraft   30 mg at 05/01/13 0759  . hydrOXYzine (ATARAX/VISTARIL) tablet 25 mg  25 mg Oral Q6H PRN Fransisca Kaufmann, NP      . magnesium hydroxide (MILK OF MAGNESIA) suspension 30 mL  30 mL Oral Daily PRN Fransisca Kaufmann, NP   30 mL at 04/27/13 0810  . traZODone (DESYREL)  tablet 75 mg  75 mg Oral QHS PRN Corinna Burkman        Lab Results:  No results found for this or any previous visit (from the past 48 hour(s)).  Physical Findings: AIMS: Facial and Oral Movements Muscles of Facial Expression: None, normal Lips and Perioral Area: None, normal Jaw: None, normal Tongue: None, normal,Extremity Movements Upper (arms, wrists, hands, fingers): None, normal Lower (legs, knees, ankles, toes): None, normal,  , Overall Severity Severity of abnormal movements  (highest score from questions above): None, normal Incapacitation due to abnormal movements: None, normal, Dental Status Current problems with teeth and/or dentures?: No Does patient usually wear dentures?: No  CIWA:  CIWA-Ar Total: 0 COWS:     Treatment Plan Summary: Daily contact with patient to assess and evaluate symptoms and progress in treatment Medication management  Plan: Continue crisis management and stabilization.  Medication management: Continue Celexa to 30 mg daily for  depressive symptoms. Continue Vistaril 25 mg every six hours prn symptoms of anxiety.  Continue Carbamazepine ER 200mg  po BID for bipolar disorder. Continue Trazodone 75mg  po Qhs as needed for Insomnia Encouraged patient to attend groups and participate in group counseling sessions and activities.  Discharge plan in progress.  Continue current treatment plan.  Address health issues: Vitals reviewed and stable.  Tegretol Level 05/02/13. Medical Decision Making Problem Points:  Established problem, improving(1) and Review of psycho-social stressors (1) Data Points:  Review of medication regiment & side effects (2)  I certify that inpatient services furnished can reasonably be expected to improve the patient's condition.   Thedore Mins, MD 05/01/2013, 10:19 AM

## 2013-05-01 NOTE — Progress Notes (Signed)
D: Patient in bed on approach.  Patient states she is tired.  Patient states she does not want to get out of bed tonight.  Patient states she did not want any sleep medications tonight due to not eating dinner.  Patient isolative and forwards little.  Patient denies SI/HI and denies AVH   A: Staff to monitor Q 15 mins for safety.  Encouragement and support offered. No scheduled medications administered per orders. R: Patient remains safe on the unit.  Patient did not attend group tonight.  Patient not visible on the unit and not interacting with peers.

## 2013-05-02 LAB — CARBAMAZEPINE LEVEL, TOTAL: Carbamazepine Lvl: 5.8 ug/mL (ref 4.0–12.0)

## 2013-05-02 NOTE — Progress Notes (Signed)
Recreation Therapy Notes  Date: 10.22.2014 Time: 9:30am Location: [address] Dayroom  Group Topic: Self-Esteem  Goal Area(s) Addresses:  Patient will effectively give examples of way to increase self-esteem. Patient will verbalize benefit of increased self-esteem.   Behavioral Response: Disengaged  Intervention: Air traffic controller  Activity: Patient were given the '10 Steps to Self-Esteem' worksheet and asked to identify why each step is important, as well as given examples of how they can invest in each step. 10 Steps include: 1- Know yourself, 2- Understand what makes you feel great. 3- Recognize things that get your down. 4- Set goals to achieve what you want. 5- Develop trusting friendships that make you feel good. 6- Don't be afraid to ask for help. 7- Stand up for your beliefs and values 8- Help someone else 9- Take responsibility for your own actions 10- Take good care of yourself.   Education:  Discharge Planning, Self investment  Education Outcome: Needs additional education  Clinical Observations/Feedback: Patient attended group session, but made no contributions to group discussion.   Marykay Lex Eydan Chianese, LRT/CTRS  Jearl Klinefelter 05/02/2013 12:33 PM

## 2013-05-02 NOTE — [facility] Group Notes (Signed)
[facility] LCSW Aftercare Discharge Planning Group Note   05/02/2013 1:42 PM  Participation Quality:  Minimal  Mood/Affect:  Depressed  Depression Rating:  denies  Anxiety Rating:  denies  Thoughts of Suicide:  No Will you contract for safety?   NA  Current AVH:  No  Plan for Discharge/Comments:  Yvonne [name] continues to present as depressed.   Poor eye contact, minimal interaction, helpless, hopeless, grim.  Transportation Means: family  Supports: family  Yvonne [name], [name]

## 2013-05-02 NOTE — Progress Notes (Signed)
Patient ID: Yvonne [name], female   DOB: 1965-09-24, 47 y.o.   MRN: [id number]  [facility] MD Progress Note  05/02/2013 1:18 PM Yvonne [name]  MRN:  [id number] Subjective:  Patient states "I guess I'm feeling all right. My life is just so messed up right now."   Objective: Patient continues to report feeling anxious, hopeless, depressed,  having excessive worries,  Low energy level and  lack of motivation. She appears severely depressed today and has very poor eye contact noted by writer to be staring at the floor during our conversation. She refuses to elaborate on her reasons for feeling so depressed today speaking very softly and eventually walked away from Clinical research associate.   Diagnosis:   DSM5:  Axis I: Bipolar 1 disorder recent episode depressed            Anxiety disorder, NOS Axis II: Borderline Personality Dis. and Cluster B Traits Axis III:  Past Medical History  Diagnosis Date  . No pertinent past medical history   . Medical history non-contributory   . Mental disorder    Axis IV: occupational problems, other psychosocial or environmental problems, problems related to social environment and problems with primary support group Axis V: 41-50 serious symptoms  ADL's:  Intact  Sleep: Fair  Appetite:  Fair  Suicidal Ideation:  Denies Homicidal Ideation:  Denies AEB (as evidenced by):  Psychiatric Specialty Exam: Review of Systems  Constitutional: Negative.   HENT: Negative.   Eyes: Negative.   Respiratory: Negative.   Cardiovascular: Negative.   Gastrointestinal: Negative.   Genitourinary: Negative.   Musculoskeletal: Negative.   Skin: Negative.   Neurological: Negative.   Endo/Heme/Allergies: Negative.   Psychiatric/Behavioral: Positive for depression. Negative for suicidal ideas, hallucinations, memory loss and substance abuse. The patient is nervous/anxious. The patient does not have insomnia.     Blood pressure 105/75, pulse 75, temperature 97.6 F (36.4 C), temperature  source Oral, resp. rate 20, height 5' 1.02" (1.55 m), weight 60.328 kg (133 lb), SpO2 100.00%.Body mass index is 25.11 kg/(m^2).  General Appearance: Casual  Eye Contact::  Good  Speech:  Clear and Coherent  Volume:  Normal  Mood:  Anxious, depressed  Affect:  Sad  Thought Process:  Goal Directed  Orientation:  Full (Time, Place, and Person)  Thought Content:  Rumination  Suicidal Thoughts:  No  Homicidal Thoughts:  No  Memory:  Immediate;   Good Recent;   Good Remote;   Good  Judgement:  Impaired  Insight:  Fair  Psychomotor Activity:  Increased  Concentration:  Fair  Recall:  Fair  Akathisia:  No  Handed:  Right  AIMS (if indicated):     Assets:  Communication Skills Desire for Improvement Intimacy Leisure Time Physical Health Resilience Social Support  Sleep:  Number of Hours: 6.75   Current Medications: Current Facility-Administered Medications  Medication Dose Route Frequency Provider Last Rate Last Dose  . acetaminophen (TYLENOL) tablet 650 mg  650 mg Oral Q6H PRN Fransisca Kaufmann, NP      . alum & mag hydroxide-simeth (MAALOX/MYLANTA) 200-200-20 MG/5ML suspension 30 mL  30 mL Oral Q4H PRN Fransisca Kaufmann, NP      . carbamazepine (EQUETRO) 12 hr capsule 200 mg  200 mg Oral BID Mojeed Akintayo   200 mg at 05/02/13 0825  . citalopram (CELEXA) tablet 30 mg  30 mg Oral Daily Mojeed Akintayo   30 mg at 05/02/13 0825  . hydrOXYzine (ATARAX/VISTARIL) tablet 25 mg  25 mg Oral Q6H PRN Fransisca Kaufmann,  NP      . magnesium hydroxide (MILK OF MAGNESIA) suspension 30 mL  30 mL Oral Daily PRN Fransisca Kaufmann, NP   30 mL at 04/27/13 0810  . traZODone (DESYREL) tablet 75 mg  75 mg Oral QHS PRN Mojeed Akintayo        Lab Results:  Results for orders placed during the hospital encounter of 04/24/13 (from the past 48 hour(s))  CARBAMAZEPINE LEVEL, TOTAL     Status: None   Collection Time    05/02/13  6:22 AM      Result Value Range   Carbamazepine Lvl 5.8  4.0 - 12.0 ug/mL   Comment: Performed  at [facility]    Physical Findings: AIMS: Facial and Oral Movements Muscles of Facial Expression: None, normal Lips and Perioral Area: None, normal Jaw: None, normal Tongue: None, normal,Extremity Movements Upper (arms, wrists, hands, fingers): None, normal Lower (legs, knees, ankles, toes): None, normal,  , Overall Severity Severity of abnormal movements (highest score from questions above): None, normal Incapacitation due to abnormal movements: None, normal, Dental Status Current problems with teeth and/or dentures?: No Does patient usually wear dentures?: No  CIWA:  CIWA-Ar Total: 0 COWS:     Treatment Plan Summary: Daily contact with patient to assess and evaluate symptoms and progress in treatment Medication management  Plan: Continue crisis management and stabilization.  Medication management: Increase Celexa to 40 mg daily for depressive symptoms. Continue Vistaril 25 mg every six hours prn symptoms of anxiety.  Continue Carbamazepine ER 200mg  po BID for bipolar disorder. Continue Trazodone 75mg  po Qhs as needed for Insomnia Encouraged patient to attend groups and participate in group counseling sessions and activities.  Discharge plan in progress.  Continue current treatment plan.  Address health issues: Vitals reviewed and stable.  Tegretol Level 5.8 on 05/02/13.   Medical Decision Making Problem Points:  Established problem, worsening(2) and Review of psycho-social stressors (1) Data Points:  Review of medication regiment & side effects (2)  I certify that inpatient services furnished can reasonably be expected to improve the patient's condition.   Fransisca Kaufmann, NP-C 05/02/2013, 1:18 PM

## 2013-05-02 NOTE — [facility] Group Notes (Signed)
[facility] Group Therapy  05/02/2013 , 1:42 PM    Type of Therapy:  [facility] [employer] Presentation  Participation Level:  Active  Participation Quality:  Attentive  Affect:  Blunted  Cognitive:  Oriented  Insight:  Limited  Engagement in Therapy:  Engaged  Modes of Intervention:  Discussion, Education and Socialization  Summary of Progress/Problems:  Onalee Hua from [facility] [employer] [facility] came to present his recovery story and play the guitar.  Kaira sat quietly throughout group.  No interaction with the presenter.  Daryel Gerald B 05/02/2013 , 1:42 PM

## 2013-05-02 NOTE — Progress Notes (Signed)
Adult Psychoeducational Group Note  Date:  05/02/2013 Time:  11:00AM Group Topic/Focus:  Personal Choices and Values:   The focus of this group is to help patients assess and explore the importance of values in their lives, how their values affect their decisions, how they express their values and what opposes their expression.  Participation Level:  Active  Participation Quality:  Appropriate and Attentive  Affect:  Appropriate  Cognitive:  Alert and Appropriate  Insight: Appropriate  Engagement in Group:  Engaged  Modes of Intervention:  Discussion  Additional Comments:  Pt. Was attentive and appropriate during today's group discussion. Pt was able to discuss personal values and crisis plan.   Bing Plume D 05/02/2013, 12:08 PM

## 2013-05-02 NOTE — Progress Notes (Addendum)
Patient ID: Yvonne [name], female   DOB: [birth year], 47 y.o.   MRN: [id number]  D: Pt. presents with depressed mood and blunted affect. Pt. denies SI/HI and A/V hallucinations. Patient denies pain and discomfort at this time. Pt. is minimally seen in the milieu. Patient continues to be somewhat isolative and minimal when speaking with Clinical research associate. Writer inquired why patient was staying in bed instead of going outside for recreation time and asked how patient felt. Patient stated "I can't describe what I'm feeling." Pt reports her depression and hopelessness at a 1/10 (0-10) scale today. Pt reports that she wants to eat right and exercise after she is discharged from [facility].  A: Support and encouragement  given by Clinical research associate to patient about going to groups and participating in her treatment.   R: Pt. Is minimal but has an increase in reception of writer's suggestions. Pt. States that she will not be going outside for recreation time today. Q15 minute safety checks are maintained. Patient is safe at this time.

## 2013-05-03 MED ORDER — ADULT MULTIVITAMIN W/MINERALS CH
1.0000 | ORAL_TABLET | Freq: Every day | ORAL | Status: DC
  Administered 2013-05-03 – 2013-05-04 (×2): 1 via ORAL
  Filled 2013-05-03 (×4): qty 1

## 2013-05-03 MED ORDER — ENSURE COMPLETE PO LIQD: 237.0000 mL | Freq: Two times a day (BID) | ORAL | Status: DC

## 2013-05-03 MED ORDER — CARBAMAZEPINE ER 200 MG PO CP12
200.0000 mg | ORAL_CAPSULE | Freq: Every day | ORAL | Status: DC
  Filled 2013-05-03 (×3): qty 1

## 2013-05-03 NOTE — Progress Notes (Signed)
[facility] Adult Case Management Discharge Plan :  Will you be returning to the same living situation after discharge: Yes,  home At discharge, do you have transportation home?:Yes,  sister Do you have the ability to pay for your medications:Yes,  insurance  Release of information consent forms completed and in the chart;  Patient's signature needed at discharge.  Patient to Follow up at: Follow-up Information   Follow up with [facility]  On 05/29/2013. (Tuesday at 2:00PM with Dr Jannifer Franklin)    Contact information:   [address]  [address]  [phone]      Patient denies SI/HI:   Yes,  yes    Safety Planning and Suicide Prevention discussed:  Yes,  yes  Ida Rogue 05/03/2013, 12:09 PM

## 2013-05-03 NOTE — [facility] Group Notes (Signed)
[facility] Group Notes:  (Counselor/Nursing/MHT/Case Management/Adjunct)  05/03/2013 1:15PM  Type of Therapy:  Group Therapy  Participation Level:  Did not attend    Summary of Progress/Problems: The topic for group was balance in life.  Pt participated in the discussion about when their life was in balance and out of balance and how this feels.  Pt discussed ways to get back in balance and short term goals they can work on to get where they want to be.    Daryel Gerald B 05/03/2013 3:15 PM

## 2013-05-03 NOTE — Progress Notes (Signed)
Nutrition Brief Note  Patient identified on the Malnutrition Screening Tool (MST) Report.  Wt Readings from Last 10 Encounters:  04/25/13 133 lb (60.328 kg)  01/29/13 126 lb (57.153 kg)  01/01/13 122 lb (55.339 kg)  12/04/12 129 lb (58.514 kg)  11/29/12 129 lb (58.514 kg)  10/18/12 129 lb (58.514 kg)  08/31/12 136 lb 12.8 oz (62.052 kg)  08/17/12 137 lb (62.143 kg)  08/02/12 135 lb 9.6 oz (61.508 kg)  07/11/12 135 lb 6.4 oz (61.417 kg)   Body mass index is 25.11 kg/(m^2). Patient meets criteria for overweight based on current BMI.   Discussed intake PTA with patient and compared to intake presently.  Discussed changes in intake and encouraged adequate intake of meals and snacks.  Pt reports eating well at home 2-3 meals/day. Admits her diet was not well balanced and included a lot of fried foods and was low in vegetables. Reports poor appetite since admission r/t not liking the food. Discussed alternate food options pt can request from kitchen. Pt's weight up 7 pounds in the past 3 months. Agreeable to getting Ensure Complete during admission as she is eating poorly.   Current diet order is regular and pt is also offered choice of unit snacks mid-morning and mid-afternoon.  Pt is eating as desired.   Labs and medications reviewed. Potassium low.   Nutrition Dx:  Inadequate oral intake related to poor appetite/not liking the food as evidenced by pt report.   Interventions:   Discussed the importance of a well balanced diet and encouraged intake of food and beverages.     Supplements: Ensure Complete BID   Multivitamin 1 tablet PO daily   No additional nutrition interventions warranted at this time. If nutrition issues arise, please consult RD.   Levon Hedger MS, RD, LDN [phone] Pager [phone] After Hours Pager

## 2013-05-03 NOTE — Tx Team (Signed)
  Interdisciplinary Treatment Plan Update   Date Reviewed:  05/03/2013  Time Reviewed:  12:06 PM  Progress in Treatment:   Attending groups: Yes Participating in groups: Yes Taking medication as prescribed: Yes  Tolerating medication: Yes Family/Significant other contact made: Yes  Patient understands diagnosis: Yes  Discussing patient identified problems/goals with staff: Yes Medical problems stabilized or resolved: Yes Denies suicidal/homicidal ideation: Yes Patient has not harmed self or others: Yes  For review of initial/current patient goals, please see plan of care.  Estimated Length of Stay:  D/C tomorrow  Reason for Continuation of Hospitalization:   New Problems/Goals identified:  N/A  Discharge Plan or Barriers:   return home, follow up outpt  Additional Comments:  Attendees:  Signature: Thedore Mins, MD 05/03/2013 12:06 PM   Signature: Richelle Ito, LCSW 05/03/2013 12:06 PM  Signature: Fransisca Kaufmann, NP 05/03/2013 12:06 PM  Signature: Joslyn Devon, RN 05/03/2013 12:06 PM  Signature: Liborio Nixon, RN 05/03/2013 12:06 PM  Signature:  05/03/2013 12:06 PM  Signature:   05/03/2013 12:06 PM  Signature:    Signature:    Signature:    Signature:    Signature:    Signature:      Scribe for Treatment Team:   Richelle Ito, LCSW  05/03/2013 12:06 PM

## 2013-05-03 NOTE — Progress Notes (Signed)
D:  Patient presents as flat and sad.  Speech is soft and difficult to hear.  Eye contact is only fair.  Has been attending groups, but participation level is minimal.  States she will be going home soon.  Did not fill out self inventory this morning.  Stated she had nothing to say today.  She did state that her sleep and appetite are fair and she denied thoughts of suicide or self harm.   A:  Medications given as prescribed.  Explained to patient that there was a new order for her to have Ensure between meals.  She declined at this time, but was informed she could change her mind and let nursing staff know at any time. R:  Patient verbalized understanding of education regarding meal supplements.  Tolerating medications well.  Continues with flat affect and depressed mood.

## 2013-05-03 NOTE — Progress Notes (Signed)
Patient ID: Yvonne [name], female   DOB: [birth year], 47 y.o.   MRN: [id number] D: Pt. Lying in bed, eyes closed, respiration even. A: Writer assessed for s/s of distress. Staff will monitor q81min for safety. R: pt. Is safe on the unit, no distress noted.

## 2013-05-03 NOTE — Progress Notes (Signed)
Seen and agreed. Aneita Kiger, MD 

## 2013-05-03 NOTE — Progress Notes (Signed)
Patient ID: Yvonne [name], female   DOB: 04/24/1966, 47 y.o.   MRN: [id number] [facility] MD Progress Note  05/03/2013 10:39 AM Yvonne [name]  MRN:  [id number] Subjective:  " I am okay, I am just worry about my life and my financial situation." Objective: Patient  Reports decreased anxiety and depressive symptoms. But she continues to endorse excessive worries about her financial situation and her life. She reports slightly improving motivation and energy level and denies suicidal or homicidal ideation, intent or plan. She is compliant with her medications and has not endorsed any adverse reactions. Tegretol level is 5.8 Diagnosis:   DSM5:  Axis I: Bipolar 1 disorder recent episode depressed            Anxiety disorder, NOS Axis II: Borderline Personality Dis. and Cluster B Traits Axis III:  Past Medical History  Diagnosis Date  . No pertinent past medical history   . Medical history non-contributory   . Mental disorder    Axis IV: occupational problems, other psychosocial or environmental problems, problems related to social environment and problems with primary support group Axis V: 50-60 moderate symptoms  ADL's:  Intact  Sleep: Fair  Appetite:  Fair  Suicidal Ideation:  Denies Homicidal Ideation:  Denies AEB (as evidenced by):  Psychiatric Specialty Exam: Review of Systems  Constitutional: Negative.   HENT: Negative.   Eyes: Negative.   Respiratory: Negative.   Cardiovascular: Negative.   Gastrointestinal: Negative.   Genitourinary: Negative.   Musculoskeletal: Negative.   Skin: Negative.   Neurological: Negative.   Endo/Heme/Allergies: Negative.   Psychiatric/Behavioral: Positive for depression. Negative for suicidal ideas, hallucinations, memory loss and substance abuse. The patient is nervous/anxious. The patient does not have insomnia.     Blood pressure 104/75, pulse 108, temperature 97.9 F (36.6 C), temperature source Oral, resp. rate 18, height 5' 1.02" (1.55 m),  weight 60.328 kg (133 lb), SpO2 100.00%.Body mass index is 25.11 kg/(m^2).  General Appearance: Casual  Eye Contact::  Good  Speech:  Clear and Coherent  Volume:  Normal  Mood:  Anxious, depressed  Affect:  Congruent  Thought Process:  Goal Directed  Orientation:  Full (Time, Place, and Person)  Thought Content:  Rumination  Suicidal Thoughts:  No  Homicidal Thoughts:  No  Memory:  Immediate;   Good Recent;   Good Remote;   Good  Judgement:  Impaired  Insight:  Fair  Psychomotor Activity:  Increased  Concentration:  Fair  Recall:  Fair  Akathisia:  No  Handed:  Right  AIMS (if indicated):     Assets:  Communication Skills Desire for Improvement Intimacy Leisure Time Physical Health Resilience Social Support  Sleep:  Number of Hours: 6.75   Current Medications: Current Facility-Administered Medications  Medication Dose Route Frequency Provider Last Rate Last Dose  . acetaminophen (TYLENOL) tablet 650 mg  650 mg Oral Q6H PRN Fransisca Kaufmann, NP      . alum & mag hydroxide-simeth (MAALOX/MYLANTA) 200-200-20 MG/5ML suspension 30 mL  30 mL Oral Q4H PRN Fransisca Kaufmann, NP      . carbamazepine (EQUETRO) 12 hr capsule 200 mg  200 mg Oral QPC supper Reshaun Briseno      . citalopram (CELEXA) tablet 30 mg  30 mg Oral Daily Britten Seyfried   30 mg at 05/03/13 0820  . feeding supplement (ENSURE COMPLETE) (ENSURE COMPLETE) liquid 237 mL  237 mL Oral BID BM Lavena Bullion, RD      . hydrOXYzine (ATARAX/VISTARIL) tablet 25 mg  25 mg Oral Q6H PRN Fransisca Kaufmann, NP      . magnesium hydroxide (MILK OF MAGNESIA) suspension 30 mL  30 mL Oral Daily PRN Fransisca Kaufmann, NP   30 mL at 04/27/13 0810  . multivitamin with minerals tablet 1 tablet  1 tablet Oral Daily Lavena Bullion, RD      . traZODone (DESYREL) tablet 75 mg  75 mg Oral QHS PRN Mariavictoria Nottingham        Lab Results:  Results for orders placed during the hospital encounter of 04/24/13 (from the past 48 hour(s))  CARBAMAZEPINE LEVEL, TOTAL      Status: None   Collection Time    05/02/13  6:22 AM      Result Value Range   Carbamazepine Lvl 5.8  4.0 - 12.0 ug/mL   Comment: Performed at [facility]    Physical Findings: AIMS: Facial and Oral Movements Muscles of Facial Expression: None, normal Lips and Perioral Area: None, normal Jaw: None, normal Tongue: None, normal,Extremity Movements Upper (arms, wrists, hands, fingers): None, normal Lower (legs, knees, ankles, toes): None, normal,  , Overall Severity Severity of abnormal movements (highest score from questions above): None, normal Incapacitation due to abnormal movements: None, normal, Dental Status Current problems with teeth and/or dentures?: No Does patient usually wear dentures?: No  CIWA:  CIWA-Ar Total: 0 COWS:     Treatment Plan Summary: Daily contact with patient to assess and evaluate symptoms and progress in treatment Medication management  Plan: Continue crisis management and stabilization.  Medication management: Continue Celexa to 30 mg daily for  depressive symptoms. Continue Vistaril 25 mg every six hours prn symptoms of anxiety.  Continue Carbamazepine ER 200mg  po Qhs for bipolar disorder. Continue Trazodone 75mg  po Qhs as needed for Insomnia Encouraged patient to attend groups and participate in group counseling sessions and activities.  Discharge plan in progress.  Continue current treatment plan.  Address health issues: Vitals reviewed and stable.   Medical Decision Making Problem Points:  Established problem, improving(1) and Review of psycho-social stressors (1) Data Points:  Review of medication regiment & side effects (2)  I certify that inpatient services furnished can reasonably be expected to improve the patient's condition.   Thedore Mins, MD 05/03/2013, 10:39 AM

## 2013-05-04 DIAGNOSIS — F314 Bipolar disorder, current episode depressed, severe, without psychotic features: Principal | ICD-10-CM

## 2013-05-04 MED ORDER — TRAZODONE 25 MG HALF TABLET
75.0000 mg | ORAL_TABLET | Freq: Every evening | ORAL | Status: DC | PRN
Start: 2013-05-04 — End: 2013-12-25

## 2013-05-04 MED ORDER — CITALOPRAM HYDROBROMIDE 10 MG PO TABS: 30.0000 mg | ORAL_TABLET | Freq: Every day | ORAL | Status: DC

## 2013-05-04 MED ORDER — CARBAMAZEPINE ER 200 MG PO CP12
200.0000 mg | ORAL_CAPSULE | Freq: Every day | ORAL | Status: DC
Start: 2013-05-04 — End: 2013-12-25

## 2013-05-04 MED ORDER — CARBAMAZEPINE ER 100 MG PO CP12
200.0000 mg | ORAL_CAPSULE | Freq: Every day | ORAL | Status: DC
  Filled 2013-05-04: qty 6

## 2013-05-04 NOTE — Progress Notes (Signed)
D: Pt forwarded at minimal in conversation with this Clinical research associate. She presented with a flat affect. She remained in bed during the course of the shift. Despite encouragement, she did not go to [place] this evening. She refused her ensure or any snacks.She denied any depression or anxiety. She reports being negative for SI/HI/AVH.  A: Continued support and availability as needed was extended to this pt. Staff continue to monitor pt with q64min checks.  R: Pt showed no initiated in participating within the milieu. Pt remains safe at this time.

## 2013-05-04 NOTE — Progress Notes (Signed)
Recreation Therapy Notes  Date: 10.24.2014 Time: 9:30am Location: [address] Dayroom   Group Topic: Decision Making  Goal Area(s) Addresses:  Patient will verbalize benefit of using good decision making skills. Patient will identify method of good decision making.  Behavioral Response: Did not attend.   Turhan Chill L Aaliya Maultsby, LRT/CTRS  Willodean Leven L 05/04/2013 12:12 PM 

## 2013-05-04 NOTE — Progress Notes (Signed)
Patient ID: Yvonne [name], female   DOB: 1965/10/11, 47 y.o.   MRN: [id number] Discharge orders received from MD. Patient aware of pending discharge. AVS copy reviewed at length with writer, copy provided with follow up instructions, Rx given, 14 day free supply of medications given. Patient denies any SI/HI/A/V Hallucinations.. Patient verbalized understanding. All belongings returned and letter from SW provided. Patient escorted from unit with writer to care of sister Jerrye Beavers.

## 2013-05-04 NOTE — Progress Notes (Signed)
Patient ID: Yvonne [name], female   DOB: 06/26/1966, 47 y.o.   MRN: [id number] D. Patient presents with depressed mood, affect irritable. Patient with minimal interaction with staff and peers, and gave blunted short responses to engagement with writer this am.  Patient shrugged shoulders when asking how patient doing this am. Pt refused to attend unit programming, and refused to complete self inventory. A. Medications given as ordered. Discussed above information with treatment team and MD. Offered support and encouragement . R. Patient remains isolative to room. No further voiced concerns at this time. Patient aware of pending discharge today. Will continue to monitor q 15 minutes for safety.

## 2013-05-04 NOTE — Discharge Summary (Signed)
Physician Discharge Summary Note  Patient:  Yvonne [name] is an 47 y.o., female MRN:  [id number] DOB:  07-Dec-1965 Patient phone:  [phone] (home)  Patient address:   [address] [place] [address]   Date of Admission:  04/24/2013 Date of Discharge: 05/04/2013  Discharge Diagnoses: Principal Problem:   Bipolar I disorder, most recent episode (or current) depressed, severe, without mention of psychotic behavior  Axis Diagnosis:  AXIS I: Bipolar I disorder, most recent episode (or current) depressed, severe, without mention of psychotic behavior  AXIS II: Cluster B Traits  AXIS III:  Past Medical History   Diagnosis  Date   .  No pertinent past medical history    .  Medical history non-contributory    .  Mental disorder     AXIS IV: economic problems, other psychosocial or environmental problems and problems related to social environment  AXIS V: 61-70 mild symptoms  Level of Care:  OP  Hospital Course:   This is a 47 year old female who presented as a walk in with her family members. Her sister and niece had the patient put under IVC in June of this year for being very hostile and disruptive in her neighborhood. Her family reports she did well for a time after d/c but then began to display manic behaviors such as shopping and gambling out of control. Patient states today "I am so anxious. I am overwhelmed with bills. I've been spending too much. I stopped taking my medications in August. I tried to handle it but I can't. I'm so worried about what is going to happen. I've done wrong and I regret it." Patient had difficulty answering questions due to her high anxiety level. She was noted to be shaking her left leg very rapidly and was holding her head in her hands. Patient noted to also be obsessing about her past spending behaviors and the consequences at present.   While a patient in this hospital, Philippa Vessey was enrolled in group counseling and activities as well as  received the following medication Current facility-administered medications:acetaminophen (TYLENOL) tablet 650 mg, 650 mg, Oral, Q6H PRN, Fransisca Kaufmann, NP;  alum & mag hydroxide-simeth (MAALOX/MYLANTA) 200-200-20 MG/5ML suspension 30 mL, 30 mL, Oral, Q4H PRN, Fransisca Kaufmann, NP;  carbamazepine (EQUETRO) 12 hr capsule 200 mg, 200 mg, Oral, QPC supper, Cynthia Cogle;  citalopram (CELEXA) tablet 30 mg, 30 mg, Oral, Daily, Virgal [place], 30 mg at 05/04/13 2130 feeding supplement (ENSURE COMPLETE) (ENSURE COMPLETE) liquid 237 mL, 237 mL, Oral, BID BM, Lavena Bullion, RD;  hydrOXYzine (ATARAX/VISTARIL) tablet 25 mg, 25 mg, Oral, Q6H PRN, Fransisca Kaufmann, NP;  magnesium hydroxide (MILK OF MAGNESIA) suspension 30 mL, 30 mL, Oral, Daily PRN, Fransisca Kaufmann, NP, 30 mL at 04/27/13 0810;  multivitamin with minerals tablet 1 tablet, 1 tablet, Oral, Daily, Lavena Bullion, RD, 1 tablet at 05/04/13 0823 traZODone (DESYREL) tablet 75 mg, 75 mg, Oral, QHS PRN, Ruthanne Mcneish Current outpatient prescriptions:carbamazepine (EQUETRO) 200 MG CP12 12 hr capsule, Take 1 capsule (200 mg total) by mouth daily after supper., Disp: 60 each, Rfl: 0;  citalopram (CELEXA) 10 MG tablet, Take 3 tablets (30 mg total) by mouth daily., Disp: 30 tablet, Rfl: 0;  traZODone (DESYREL) 25 mg TABS tablet, Take 1.5 tablets (75 mg total) by mouth at bedtime as needed for sleep., Disp: 45 tablet, Rfl: 0 The patient's medications were managed by the MD. She was reported to have only been taking ativan prior to her admission. Damonie  was started on Celexa for depression and Equetro for mood stability. Patient appeared quite depressed during his admission and her Celexa was increased to 30 mg daily. Patient expressed a great deal of regret over excessive spending during her recent manic episode prior to admission. The patient would make comments such as "My life is a mess. I don't know how to get it right." Doristine demonstrated depressed mood and withdrawn  behaviors. At times she would not look staff in the eye while speaking and her voice was barely audible. Patient remained depressed but denied SI. She was compliant with medication other than occasionally refusing a dose of tegretol and treatment team was aware of this. Patient was given prescriptions and sample medications upon discharge. Patient attended treatment team meeting this am and met with treatment team members. Pt symptoms, treatment plan and response to treatment discussed. Ayssa Cowden endorsed that their symptoms have improved. Pt also stated that they are stable for discharge.  In other to control Principal Problem:   Bipolar I disorder, most recent episode (or current) depressed, severe, without mention of psychotic behavior , they will continue psychiatric care on outpatient basis. They will follow-up at      Follow-up Information   Follow up with [facility]  On 05/29/2013. (Tuesday at 2:00PM with Dr Jannifer Franklin)    Contact information:   [address]  [address]  [phone]    .  In addition they were instructed to take all your medications as prescribed by your mental healthcare provider, to report any adverse effects and or reactions from your medicines to your outpatient provider promptly, patient is instructed and cautioned to not engage in alcohol and or illegal drug use while on prescription medicines, in the event of worsening symptoms, patient is instructed to call the crisis hotline, 911 and or go to the nearest ED for appropriate evaluation and treatment of symptoms.   Upon discharge, patient adamantly denies suicidal, homicidal ideations, auditory, visual hallucinations and or delusional thinking. They left [facility] with all personal belongings in no apparent distress.  Consults:  See electronic record for details  Significant Diagnostic Studies:  See electronic record for details  Discharge Vitals:   Blood pressure 111/76, pulse 105,  temperature 97.5 F (36.4 C), temperature source Oral, resp. rate 18, height 5' 1.02" (1.55 m), weight 60.328 kg (133 lb), SpO2 100.00%..  Mental Status Exam: See Mental Status Examination and Suicide Risk Assessment completed by Attending Physician prior to discharge.  Discharge destination:  Home  Is patient on multiple antipsychotic therapies at discharge:  No  Has Patient had three or more failed trials of antipsychotic monotherapy by history: N/A Recommended Plan for Multiple Antipsychotic Therapies: N/A    Medication List    STOP taking these medications       LORazepam 0.5 MG tablet  Commonly known as:  ATIVAN      TAKE these medications     Indication   carbamazepine 200 MG Cp12 12 hr capsule  Commonly known as:  EQUETRO  Take 1 capsule (200 mg total) by mouth daily after supper.   Indication:  Manic-Depression     citalopram 10 MG tablet  Commonly known as:  CELEXA  Take 3 tablets (30 mg total) by mouth daily.   Indication:  Depression     traZODone 25 mg Tabs tablet  Commonly known as:  DESYREL  Take 1.5 tablets (75 mg total) by mouth at bedtime as needed for sleep.  Indication:  Trouble Sleeping       Follow-up Information   Follow up with [facility]  On 05/29/2013. (Tuesday at 2:00PM with Dr Jannifer Franklin)    Contact information:   [address]  [address]  [phone]     Follow-up recommendations:   Activities: Resume typical activities Diet: Resume typical diet Tests: none Other: Follow up with outpatient provider and report any side effects to out patient prescriber.  Comments:  Take all your medications as prescribed by your mental healthcare provider. Report any adverse effects and or reactions from your medicines to your outpatient provider promptly. Patient is instructed and cautioned to not engage in alcohol and or illegal drug use while on prescription medicines. In the event of worsening symptoms, patient is  instructed to call the crisis hotline, 911 and or go to the nearest ED for appropriate evaluation and treatment of symptoms. Follow-up with your primary care provider for your other medical issues, concerns and or health care needs.  SignedFransisca Kaufmann NP-C 05/04/2013 2:46 PM   Seen and agreed. Thedore Mins, MD

## 2013-05-04 NOTE — [facility] Suicide Risk Assessment (Signed)
Suicide Risk Assessment  Discharge Assessment     Demographic Factors:  Low socioeconomic status and female  Mental Status Per Nursing Assessment::   On Admission:  NA  Current Mental Status by Physician: patient denies suicidal ideation, intent or plan  Loss Factors: Financial problems/change in socioeconomic status  Historical Factors: Impulsivity  Risk Reduction Factors:   Sense of responsibility to family, Living with another person, especially a relative and Positive social support  Continued Clinical Symptoms:  Resolving mood symptoms  Cognitive Features That Contribute To Risk:  Closed-mindedness    Suicide Risk:  Minimal: No identifiable suicidal ideation.  Patients presenting with no risk factors but with morbid ruminations; may be classified as minimal risk based on the severity of the depressive symptoms  Discharge Diagnoses:   AXIS I:  Bipolar I disorder, most recent episode (or current) depressed, severe, without mention of psychotic behavior  AXIS II:  Cluster B Traits AXIS III:   Past Medical History  Diagnosis Date  . No pertinent past medical history   . Medical history non-contributory   . Mental disorder    AXIS IV:  economic problems, other psychosocial or environmental problems and problems related to social environment AXIS V:  61-70 mild symptoms  Plan Of Care/Follow-up recommendations:  Activity:  as tolerated Diet:  healthy Tests:  Carbamazepine level: 5.8 Other:  patient to keep her after care appointment  Is patient on multiple antipsychotic therapies at discharge:  No   Has Patient had three or more failed trials of antipsychotic monotherapy by history:  No  Recommended Plan for Multiple Antipsychotic Therapies: NA  Thedore Mins, MD 05/04/2013, 9:38 AM

## 2013-05-04 NOTE — [facility] Group Notes (Signed)
[facility] LCSW Aftercare Discharge Planning Group Note   05/04/2013 9:28 AM  Participation Quality:  Did not attend this AM.  Patient asleep in bed and refused to go home.  DC plan:  Will follow up with Dr. Mervyn Skeeters with [facility] for medication.  MSW intern to follow up with patient about therapy. She will be transported by her sister.     Nail, Catalina Gravel

## 2013-05-04 NOTE — Progress Notes (Signed)
Pt did not attend karaoke group this evening.  

## 2013-05-08 NOTE — Progress Notes (Addendum)
Patient Discharge Instructions:  After Visit Summary (AVS):   Faxed to:  05/08/13 Discharge Summary Note:   Faxed to:  05/08/13 Psychiatric Admission Assessment Note:   Faxed to:  05/08/13 Suicide Risk Assessment - Discharge Assessment:   Faxed to:  05/08/13 Faxed/Sent to the Next Level Care provider:  05/08/13 Faxed to [facility] @ [phone]  Jerelene Redden, 05/08/2013, 3:50 PM

## 2013-12-25 ENCOUNTER — Ambulatory Visit (INDEPENDENT_AMBULATORY_CARE_PROVIDER_SITE_OTHER): Payer: 59 | Admitting: Family Medicine

## 2013-12-25 ENCOUNTER — Ambulatory Visit (INDEPENDENT_AMBULATORY_CARE_PROVIDER_SITE_OTHER): Payer: 59

## 2013-12-25 VITALS — BP 128/88 | HR 76 | Temp 97.8°F | Resp 17 | Ht 61.0 in | Wt 137.4 lb

## 2013-12-25 DIAGNOSIS — S40029A Contusion of unspecified upper arm, initial encounter: Secondary | ICD-10-CM

## 2013-12-25 DIAGNOSIS — S40021A Contusion of right upper arm, initial encounter: Secondary | ICD-10-CM

## 2013-12-25 DIAGNOSIS — S40019A Contusion of unspecified shoulder, initial encounter: Secondary | ICD-10-CM

## 2013-12-25 DIAGNOSIS — S40011A Contusion of right shoulder, initial encounter: Secondary | ICD-10-CM

## 2013-12-25 MED ORDER — MELOXICAM 7.5 MG PO TABS: ORAL_TABLET | ORAL | Status: DC

## 2013-12-25 NOTE — Progress Notes (Signed)
[facility] [address], [place] [id number] [phone]  Date:  12/25/2013   Name:  Yvonne BalsamMelvine [name]   DOB:  03/11/1966   MRN:  [id number]  PCP:  Tally DueGUEST, CHRIS WARREN, MD    Chief Complaint: Arm Injury   History of Present Illness:  Yvonne [name] is a 48 y.o. very pleasant female patient who presents with the following:  She was leaving a store today and a sliding door shut on her.  She thought she was ok, but then noted pain in her right neck/ shoulder/ arm and hand.  The "pain is terrible."  The accident occurred around 1pm per her recollection. She was not pinched between the door and the frame, but the door hit her on her right side.  "The door slammed into me, I was not expecting it."   She is right handed.   She is menopausal- no menses in years.   She works at [employer] in [employer]  Patient Active Problem List   Diagnosis Date Noted  . Bipolar I disorder, most recent episode (or current) depressed, severe, without mention of psychotic behavior 04/25/2013  . Bipolar disorder, unspecified 01/02/2013  . MDD (major depressive disorder) 08/02/2012    Past Medical History  Diagnosis Date  . No pertinent past medical history   . Medical history non-contributory   . Mental disorder     Past Surgical History  Procedure Laterality Date  . No past surgeries      History  Substance Use Topics  . Smoking status: Never Smoker   . Smokeless tobacco: Never Used  . Alcohol Use: No    Family History  Problem Relation Age of Onset  . Depression Sister   . Depression Brother   . Bipolar disorder Cousin   . Depression Sister     No Known Allergies  Medication list has been reviewed and updated.  Current Outpatient Prescriptions on File Prior to Visit  Medication Sig Dispense Refill  . citalopram (CELEXA) 10 MG tablet Take 3 tablets (30 mg total) by mouth daily.  30 tablet  0   No current facility-administered medications on file  prior to visit.    Review of Systems:  As per HPI- otherwise negative.   Physical Examination: Filed Vitals:   12/25/13 1511  BP: 128/88  Pulse: 76  Temp: 97.8 F (36.6 C)  Resp: 17   Filed Vitals:   12/25/13 1511  Height: 5\' 1"  (1.549 m)  Weight: 137 lb 6.4 oz (62.324 kg)   Body mass index is 25.97 kg/(m^2). Ideal Body Weight: Weight in (lb) to have BMI = 25: 132  GEN: WDWN, NAD, Non-toxic, A & O x 3, looks well HEENT: Atraumatic, Normocephalic. Neck supple. No masses, No LAD. No bony cervical spine tenderness and normal neck ROM Ears and Nose: No external deformity. CV: RRR, No M/G/R. No JVD. No thrill. No extra heart sounds. PULM: CTA B, no wheezes, crackles, rhonchi. No retractions. No resp. distress. No accessory muscle use. ABD: S, NT, ND EXTR: No c/c/e NEURO Normal gait.  PSYCH: Normally interactive. Conversant. Not depressed or anxious appearing.  Calm demeanor.  Has apparently severe tenderness to exam of the right trapezius/ shoulder/ upper arm/ elbow/ wrist and hand.  No swelling or bruise.   ROM difficult to determine as she resists movement, but no gross abnormality noted, no dislocation or deformity noted.  Normal pulses and perfusion of the wrist and hand.   [facility] reading (PRIMARY)  by  Dr. Patsy Lageropland. Right scapula: negative Right shoulder: negative Right humerus: negative Right elbow: negative Right wrist: negative  RIGHT SCAPULA - 2+ VIEWS  COMPARISON: 12/25/2013.  FINDINGS: No displaced scapular fracture identified. Mild AC joint osteoarthritis.  IMPRESSION: Negative.  RIGHT SHOULDER - 2+ VIEW  COMPARISON: None.  FINDINGS: Glenohumeral joint is intact. No evidence of scapular fracture or humeral fracture. The acromioclavicular joint is intact.  IMPRESSION: No acute osseous abnormality.  RIGHT HUMERUS - 2+ VIEW  COMPARISON: None.  FINDINGS:  Two internally rotated views of the right humerus demonstrate no  significant abnormality.  Distal humerus excluded but was  specifically included on the elbow images.  IMPRESSION:  Negative.  RIGHT ELBOW - COMPLETE 3+ VIEW COMPARISON: None.  FINDINGS: There is no evidence of fracture, dislocation, or joint effusion. There is no evidence of arthropathy or other focal bone abnormality. Soft tissues are unremarkable.  IMPRESSION: Negative.  RIGHT WRIST - COMPLETE 3+ VIEW  COMPARISON: None.  FINDINGS: No acute fracture or malalignment. There is focal osteoarthritic change to the thumb, involving the thumb base and interphalangeal joint with joint narrowing, marginal spurring, and subchondral cystic formation.  IMPRESSION: No acute osseous findings.  Assessment and Plan: Contusion of right arm - Plan: DG Elbow Complete Right, DG Wrist Complete Right, DG Scapula Right, meloxicam (MOBIC) 7.5 MG tablet  Contusion of right shoulder - Plan: DG Shoulder Right, DG Humerus Right, meloxicam (MOBIC) 7.5 MG tablet  Here today with a contusion to her right side upper body.  reassured that no fracture is evident.  mobic to use as needed for pain See patient instructions for more details.     Signed Abbe AmsterdamJessica Copland, MD

## 2013-12-25 NOTE — Patient Instructions (Signed)
Use the mobic as needed for pain- you can also try tylenol. You may feel more sore tomorrow!  Try to keep moving to prevent stiffness, and you can use ice as needed.  Let me know if not feeling better soon

## 2014-02-08 ENCOUNTER — Ambulatory Visit (INDEPENDENT_AMBULATORY_CARE_PROVIDER_SITE_OTHER): Payer: 59 | Admitting: Physician Assistant

## 2014-02-08 VITALS — BP 100/60 | HR 79 | Temp 98.6°F | Resp 16 | Ht 60.5 in | Wt 142.4 lb

## 2014-02-08 DIAGNOSIS — J029 Acute pharyngitis, unspecified: Secondary | ICD-10-CM

## 2014-02-08 DIAGNOSIS — R51 Headache: Secondary | ICD-10-CM

## 2014-02-08 LAB — POCT CBC
Granulocyte percent: 80.6 %G — AB (ref 37–80)
HCT, POC: 37.1 % — AB (ref 37.7–47.9)
Hemoglobin: 12.5 g/dL (ref 12.2–16.2)
Lymph, poc: 1.3 (ref 0.6–3.4)
MCH, POC: 28.8 pg (ref 27–31.2)
MCHC: 33.6 g/dL (ref 31.8–35.4)
MCV: 85.6 fL (ref 80–97)
MID (cbc): 0.4 (ref 0–0.9)
MPV: 6.5 fL (ref 0–99.8)
POC Granulocyte: 6.9 (ref 2–6.9)
POC LYMPH PERCENT: 14.9 %L (ref 10–50)
POC MID %: 4.5 %M (ref 0–12)
Platelet Count, POC: 336 10*3/uL (ref 142–424)
RBC: 4.33 M/uL (ref 4.04–5.48)
RDW, POC: 13.3 %
WBC: 8.5 10*3/uL (ref 4.6–10.2)

## 2014-02-08 LAB — POCT RAPID STREP A (OFFICE): Rapid Strep A Screen: NEGATIVE

## 2014-02-08 MED ORDER — HYDROCODONE-ACETAMINOPHEN 5-325 MG PO TABS: 1.0000 | ORAL_TABLET | Freq: Three times a day (TID) | ORAL | Status: DC | PRN

## 2014-02-08 MED ORDER — FIRST-DUKES MOUTHWASH MT SUSP: 5.0000 mL | OROMUCOSAL | Status: DC | PRN

## 2014-02-08 NOTE — Patient Instructions (Signed)

## 2014-02-08 NOTE — Progress Notes (Signed)
Subjective:    Patient ID: Yvonne [name], female    DOB: 11-06-1965, 48 y.o.   MRN: [id number]  HPI 48 year old female presents for evaluation of 2 day history of headache and sore throat. Symptoms started yesterday and have worsened today. She describes left sided sore throat and ear pain along with headache.  No known exposure to strep or significant hx of strep throat.  Denies fever, chills, nausea, vomiting, dizziness, vision changes, cough, or URI sx's.  Admits it is painful to swallow but she is able to.   Has taken OTC Excedrin 1 hour ago that has helped slightly.      Review of Systems  Constitutional: Negative for fever and chills.  HENT: Positive for ear pain (left side) and sore throat (left side). Negative for congestion, ear discharge, postnasal drip and rhinorrhea.   Respiratory: Negative for cough.   Gastrointestinal: Negative for nausea and vomiting.  Neurological: Positive for headaches. Negative for dizziness.       Objective:   Physical Exam  Constitutional: She is oriented to person, place, and time. She appears well-developed and well-nourished.  HENT:  Head: Normocephalic and atraumatic.  Right Ear: Hearing, tympanic membrane, external ear and ear canal normal.  Left Ear: Hearing, tympanic membrane, external ear and ear canal normal.  Mouth/Throat: Uvula is midline and mucous membranes are normal. Posterior oropharyngeal erythema present. No oropharyngeal exudate, posterior oropharyngeal edema or tonsillar abscesses.  Eyes: Conjunctivae are normal.  Neck: Normal range of motion. Neck supple.  Cardiovascular: Normal rate, regular rhythm and normal heart sounds.   Pulmonary/Chest: Effort normal and breath sounds normal.  Lymphadenopathy:    She has no cervical adenopathy.  Neurological: She is alert and oriented to person, place, and time.  Psychiatric: She has a normal mood and affect. Her behavior is normal. Judgment and thought content normal.       Results for orders placed in visit on 02/08/14  POCT RAPID STREP A (OFFICE)      Result Value Ref Range   Rapid Strep A Screen Negative  Negative  POCT CBC      Result Value Ref Range   WBC 8.5  4.6 - 10.2 K/uL   Lymph, poc 1.3  0.6 - 3.4   POC LYMPH PERCENT 14.9  10 - 50 %L   MID (cbc) 0.4  0 - 0.9   POC MID % 4.5  0 - 12 %M   POC Granulocyte 6.9  2 - 6.9   Granulocyte percent 80.6 (*) 37 - 80 %G   RBC 4.33  4.04 - 5.48 M/uL   Hemoglobin 12.5  12.2 - 16.2 g/dL   HCT, POC 40.937.1 (*) 81.137.7 - 47.9 %   MCV 85.6  80 - 97 fL   MCH, POC 28.8  27 - 31.2 pg   MCHC 33.6  31.8 - 35.4 g/dL   RDW, POC 91.413.3     Platelet Count, POC 336  142 - 424 K/uL   MPV 6.5  0 - 99.8 fL       Assessment & Plan:  Acute pharyngitis, unspecified pharyngitis type - Plan: POCT rapid strep A, POCT CBC, Culture, Group A Strep, Diphenhyd-Hydrocort-Nystatin (FIRST-DUKES MOUTHWASH) SUSP  Headache(784.0) - Plan: HYDROcodone-acetaminophen (NORCO) 5-325 MG per tablet  Likely viral pharyngitis with headache. Will treat conservatively with Duke's mouthwash q2-3 hours prn pain.  Norco 5/325 mg q8hours prn headache, sore throat. Ok to continue Excedrin or ibuprofen if needed.  Increase fluids and rest. RTC  precautions discussed F/u if symptoms worsening or fail to improve.

## 2014-02-09 ENCOUNTER — Emergency Department (INDEPENDENT_AMBULATORY_CARE_PROVIDER_SITE_OTHER)
Admission: EM | Admit: 2014-02-09 | Discharge: 2014-02-09 | Disposition: A | Discharge status: Nursing Home (Includes ICF) | Payer: 59 | Source: Home / Self Care | Attending: Emergency Medicine | Admitting: Emergency Medicine

## 2014-02-09 ENCOUNTER — Emergency Department (HOSPITAL_COMMUNITY): Payer: 59

## 2014-02-09 ENCOUNTER — Encounter (HOSPITAL_COMMUNITY): Payer: Self-pay | Admitting: Emergency Medicine

## 2014-02-09 ENCOUNTER — Observation Stay (HOSPITAL_COMMUNITY)
Admission: EM | Admit: 2014-02-09 | Discharge: 2014-02-10 | Disposition: A | Discharge status: Home / Self Care | Payer: 59 | Attending: Family Medicine | Admitting: Family Medicine

## 2014-02-09 DIAGNOSIS — R221 Localized swelling, mass and lump, neck: Secondary | ICD-10-CM | POA: Diagnosis not present

## 2014-02-09 DIAGNOSIS — R1310 Dysphagia, unspecified: Secondary | ICD-10-CM | POA: Insufficient documentation

## 2014-02-09 DIAGNOSIS — R6883 Chills (without fever): Secondary | ICD-10-CM | POA: Insufficient documentation

## 2014-02-09 DIAGNOSIS — Z79899 Other long term (current) drug therapy: Secondary | ICD-10-CM | POA: Insufficient documentation

## 2014-02-09 DIAGNOSIS — J029 Acute pharyngitis, unspecified: Secondary | ICD-10-CM | POA: Insufficient documentation

## 2014-02-09 DIAGNOSIS — L03211 Cellulitis of face: Secondary | ICD-10-CM | POA: Diagnosis not present

## 2014-02-09 DIAGNOSIS — F319 Bipolar disorder, unspecified: Secondary | ICD-10-CM

## 2014-02-09 DIAGNOSIS — L0201 Cutaneous abscess of face: Principal | ICD-10-CM | POA: Insufficient documentation

## 2014-02-09 DIAGNOSIS — F314 Bipolar disorder, current episode depressed, severe, without psychotic features: Secondary | ICD-10-CM | POA: Diagnosis not present

## 2014-02-09 DIAGNOSIS — F3163 Bipolar disorder, current episode mixed, severe, without psychotic features: Secondary | ICD-10-CM | POA: Diagnosis present

## 2014-02-09 DIAGNOSIS — R220 Localized swelling, mass and lump, head: Secondary | ICD-10-CM | POA: Insufficient documentation

## 2014-02-09 DIAGNOSIS — F489 Nonpsychotic mental disorder, unspecified: Secondary | ICD-10-CM | POA: Insufficient documentation

## 2014-02-09 HISTORY — DX: Bipolar disorder, unspecified: F31.9

## 2014-02-09 LAB — CBC WITH DIFFERENTIAL/PLATELET
BASOS ABS: 0 10*3/uL (ref 0.0–0.1)
Basophils Relative: 0 % (ref 0–1)
Eosinophils Absolute: 0 10*3/uL (ref 0.0–0.7)
Eosinophils Relative: 0 % (ref 0–5)
HCT: 34 % — ABNORMAL LOW (ref 36.0–46.0)
HEMOGLOBIN: 11.6 g/dL — AB (ref 12.0–15.0)
LYMPHS ABS: 1.6 10*3/uL (ref 0.7–4.0)
LYMPHS PCT: 15 % (ref 12–46)
MCH: 28.6 pg (ref 26.0–34.0)
MCHC: 34.1 g/dL (ref 30.0–36.0)
MCV: 84 fL (ref 78.0–100.0)
MONO ABS: 0.7 10*3/uL (ref 0.1–1.0)
Monocytes Relative: 6 % (ref 3–12)
NEUTROS ABS: 8.5 10*3/uL — AB (ref 1.7–7.7)
Neutrophils Relative %: 79 % — ABNORMAL HIGH (ref 43–77)
Platelets: 270 10*3/uL (ref 150–400)
RBC: 4.05 MIL/uL (ref 3.87–5.11)
RDW: 13.3 % (ref 11.5–15.5)
WBC: 10.8 10*3/uL — ABNORMAL HIGH (ref 4.0–10.5)

## 2014-02-09 LAB — COMPREHENSIVE METABOLIC PANEL
ALT: 10 U/L (ref 0–35)
AST: 15 U/L (ref 0–37)
Albumin: 3.6 g/dL (ref 3.5–5.2)
Alkaline Phosphatase: 97 U/L (ref 39–117)
Anion gap: 13 (ref 5–15)
BUN: 9 mg/dL (ref 6–23)
CHLORIDE: 104 meq/L (ref 96–112)
CO2: 23 mEq/L (ref 19–32)
Calcium: 9 mg/dL (ref 8.4–10.5)
Creatinine, Ser: 0.64 mg/dL (ref 0.50–1.10)
GFR calc Af Amer: >90 mL/min (ref >90)
GLUCOSE: 92 mg/dL (ref 70–99)
Potassium: 3.9 mEq/L (ref 3.7–5.3)
Sodium: 140 mEq/L (ref 137–147)
Total Bilirubin: 0.3 mg/dL (ref 0.3–1.2)
Total Protein: 7.8 g/dL (ref 6.0–8.3)

## 2014-02-09 MED ORDER — IOHEXOL 300 MG/ML  SOLN
80.0000 mL | Freq: Once | INTRAMUSCULAR | Status: AC | PRN
  Administered 2014-02-09: 80 mL via INTRAVENOUS

## 2014-02-09 MED ORDER — DEXAMETHASONE SODIUM PHOSPHATE 10 MG/ML IJ SOLN
10.0000 mg | Freq: Once | INTRAMUSCULAR | Status: DC
Start: 2014-02-09 — End: 2014-02-09

## 2014-02-09 MED ORDER — AMPICILLIN-SULBACTAM SODIUM 3 (2-1) G IJ SOLR
3.0000 g | Freq: Once | INTRAMUSCULAR | Status: AC
Start: 2014-02-09 — End: 2014-02-09
  Administered 2014-02-09: 3 g via INTRAVENOUS
  Filled 2014-02-09: qty 3

## 2014-02-09 MED ORDER — DEXAMETHASONE SODIUM PHOSPHATE 4 MG/ML IJ SOLN
10.0000 mg | Freq: Once | INTRAMUSCULAR | Status: AC
  Administered 2014-02-09: 10 mg via INTRAVENOUS
  Filled 2014-02-09: qty 3

## 2014-02-09 NOTE — Discharge Instructions (Signed)
We have determined that your problem requires further evaluation in the emergency department.  We will take care of your transport there.  Once at the emergency department, you will be evaluated by a provider and they will order whatever treatment or tests they deem necessary.  We cannot guarantee that they will do any specific test or do any specific treatment.  ° °

## 2014-02-09 NOTE — ED Provider Notes (Signed)
Chief Complaint   Chief Complaint  Patient presents with  . Sore Throat    History of Present Illness   Yvonne [name] is a 48 year old female who's had a three-day history of severe sore throat and pain with swallowing. This is located just on the left side. Has had a slight headache and today she had swelling of the left side of the jaw and the neck. She has trouble opening her mouth and trouble swallowing. She's unable to swallow her saliva. She was seen yesterday in urgent care at [facility]. She was prescribed some hydrocodone and Dukes Magic mouthwash, but feels worse today. Her rapid strep was negative and her white count was normal. She has a low-grade fever today.   Review of Systems   Other than as noted above, the patient denies any of the following symptoms. Systemic:  No fever, chills, sweats, myalgias, or headache. Eye:  No redness, pain or drainage. ENT:  No earache, nasal congestion, sneezing, rhinorrhea, sinus pressure, sinus pain, or post nasal drip. Lungs:  No cough, sputum production, wheezing, shortness of breath, or chest pain. GI:  No abdominal pain, nausea, vomiting, or diarrhea. Skin:  No rash.  PMFSH   Past medical history, family history, social history, meds, and allergies were reviewed. She takes medicine for bipolar disorder.  Physical Exam     Vital signs:  BP 123/67  Pulse 106  Temp(Src) 100.1 F (37.8 C) (Oral)  Resp 16  SpO2 97% General:  Alert, and appears very uncomfortable due to neck and jaw pain. She has trouble opening her mouth, thus her phonation is somewhat abnormal, she does not have a hot potato voice. She is unable to swallow her saliva and is spitting it out in a rag.  Eye:  No conjunctival injection or drainage. Lids were normal. ENT:  TMs and canals were normal, without erythema or inflammation.  Nasal mucosa was clear and uncongested, without drainage.  Mucous membranes were moist.  Exam of pharynx was difficult to see, since she  cannot open her mouth more than 1 or 2 mm.  There were no oral ulcerations or lesions. There was no bulging of the tonsillar pillars, and the uvula was midline. Her teeth are in poor repair, there is no swelling of the floor the mouth. Neck:  She has swelling of the left side of the neck and left jaw with tenderness to palpation. There is no mass. Lungs:  No respiratory distress.  Lungs were clear to auscultation, without wheezes, rales or rhonchi.  Breath sounds were clear and equal bilaterally.  Heart:  Regular rhythm, without gallops, murmers or rubs. Skin:  Clear, warm, and dry, without rash or lesions.  Assessment   The encounter diagnosis was Pharyngitis.  Differential diagnosis is odontogenic abscess, peritonsillar abscess, retropharyngeal abscess, or Lemierre's syndrome. Exam is difficult since she cannot open her mouth, I believe he needs a CT scan of the neck.  Plan     The patient was transferred to the ED via shuttle in stable condition.  Medical Decision Making:  48 year old female with 2 day history of severe sore throat, trismus, and left jaw swelling.  She is unable to swallow her saliva.  Has a low grade fever.  Difficult to examine since she cannot open her mouth more than a few mms.  She has swelling along the left jaw and neck.  Differential is odontogenic infection peri-tonsillar abscess, Lemierre's syndrome or retropharyngeal abscess.  I believe she needs a CT of  her neck.        Reuben Likesavid C Shinika Estelle, MD 02/09/14 [phone]

## 2014-02-09 NOTE — ED Notes (Signed)
MD at bedside. 

## 2014-02-09 NOTE — ED Notes (Signed)
Spoke with pharmacy about sending decadron. Sending at this time.

## 2014-02-09 NOTE — ED Provider Notes (Signed)
CSN: [id number]     Arrival date & time 02/09/14  1830 History   First MD Initiated Contact with Patient 02/09/14 1857     Chief Complaint  Patient presents with  . Sore Throat     (Consider location/radiation/quality/duration/timing/severity/associated sxs/prior Treatment) HPI   Patient endorses sore throat for 3 days she developed facial swelling and difficulty swallowing today. Patient denies any recent dental work denies any dental pain. Endorses some swelling to the left side of her face as well some jaw pain which started today. Patient states she has not had any fevers but endorses subjective chills. Denies nausea vomiting diarrhea abdominal pain or chest pain. Patient was seen in urgent care Center earlier today she had been there the day before for treatment of her sore throat and advised to use analgesic mouthwash. Today upon evaluation urgent care Center they were concerned that she possibly had a focus of infection in the face or jaw and sent her here for further evaluation. Patient has a history of bipolar disorder for which she is treated with carbamazepine.  Location left face, radiation none, quality swelling, duration 4 days, timing constantly, severity moderate, associated symptoms sore throat and difficulty swallowing, prior treatment testing and medications recommended.  Past Medical History  Diagnosis Date  . No pertinent past medical history   . Medical history non-contributory   . Mental disorder   . Bipolar 1 disorder    Past Surgical History  Procedure Laterality Date  . No past surgeries     Family History  Problem Relation Age of Onset  . Depression Sister   . Depression Brother   . Bipolar disorder Cousin   . Depression Sister   . CAD Mother   . Hypertension Father   . Diabetes Father    History  Substance Use Topics  . Smoking status: Never Smoker   . Smokeless tobacco: Never Used  . Alcohol Use: No   OB History   Grav Para Term Preterm  Abortions TAB SAB Ect Mult Living                 Review of Systems  Constitutional: Positive for chills.  HENT: Positive for facial swelling, sore throat and trouble swallowing.   Eyes: Negative.   Respiratory: Negative.   Cardiovascular: Negative.   Gastrointestinal: Negative.   Endocrine: Negative.   Genitourinary: Negative.   Musculoskeletal: Negative.   Skin: Negative.   Allergic/Immunologic: Negative.   Neurological: Negative.   Hematological: Negative.   Psychiatric/Behavioral: Negative.       Allergies  Review of patient's allergies indicates no known allergies.  Home Medications   Prior to Admission medications   Medication Sig Start Date End Date Taking? Authorizing Provider  carbamazepine (TEGRETOL XR) 400 MG 12 hr tablet Take 400 mg by mouth daily.    Yes Historical Provider, MD  citalopram (CELEXA) 40 MG tablet Take 40 mg by mouth daily.   Yes Historical Provider, MD  phenol (CHLORASEPTIC) 1.4 % LIQD Use as directed 1 spray in the mouth or throat as needed for throat irritation / pain.   Yes Historical Provider, MD   BP 119/74  Pulse 96  Temp(Src) 99.7 F (37.6 C) (Oral)  Resp 18  SpO2 98% Physical Exam  Constitutional: She is oriented to person, place, and time. She appears well-developed and well-nourished. No distress.  HENT:  Head: Normocephalic and atraumatic.  Right Ear: External ear normal.  Left Ear: External ear normal.  Nose: Nose normal.  Mouth/Throat:  Oropharynx is clear and moist. No oropharyngeal exudate.  Left mandibular and facial swelling. No visible oropharyngeal exudate, or peritonsillar swelling. No submandibular swelling, or erythema.  Eyes: Conjunctivae and EOM are normal. Pupils are equal, round, and reactive to light. Right eye exhibits no discharge. Left eye exhibits no discharge. No scleral icterus.  Neck: Normal range of motion. Neck supple. No JVD present. No tracheal deviation present. No thyromegaly present.   Cardiovascular: Normal rate, regular rhythm, normal heart sounds and intact distal pulses.  Exam reveals no gallop and no friction rub.   No murmur heard. Pulmonary/Chest: Effort normal and breath sounds normal. No stridor. No respiratory distress. She has no wheezes. She has no rales. She exhibits no tenderness.  Abdominal: Soft. She exhibits no distension.  Musculoskeletal: Normal range of motion. She exhibits no edema and no tenderness.  Lymphadenopathy:    She has no cervical adenopathy.  Neurological: She is alert and oriented to person, place, and time.  Skin: Skin is warm and dry. No rash noted. She is not diaphoretic. No erythema. No pallor.  Psychiatric: She has a normal mood and affect. Her behavior is normal. Judgment and thought content normal.    ED Course  Procedures (including critical care time) Labs Review Labs Reviewed  CBC WITH DIFFERENTIAL - Abnormal; Notable for the following:    WBC 10.8 (*)    Hemoglobin 11.6 (*)    HCT 34.0 (*)    Neutrophils Relative % 79 (*)    Neutro Abs 8.5 (*)    All other components within normal limits  COMPREHENSIVE METABOLIC PANEL    Imaging Review Ct Soft Tissue Neck W Contrast  02/09/2014   CLINICAL DATA:  Pharyngitis. Difficulty swallowing saliva. Left lower jaw swelling.  EXAM: CT NECK WITH CONTRAST  TECHNIQUE: Multidetector CT imaging of the neck was performed using the standard protocol following the bolus administration of intravenous contrast.  CONTRAST:  80mL OMNIPAQUE IOHEXOL 300 MG/ML  SOLN  COMPARISON:  None.  FINDINGS: Extensive bilateral dental cavities involving upper and lower teeth. Multiple bilateral periapical lucencies. There is mild soft tissue edema in the subcutaneous fat at the level of the mandible on the left. No fluid collections are seen. No prevertebral soft tissue swelling or airway narrowing. The piriform sinus on the left does not contain air. No visible mass within the piriform sinus. No abnormally  enlarged lymph nodes. Clear lung apices. Unremarkable bones.  IMPRESSION: 1. Extensive bilateral dental caries with periapical abscesses. 2. Mild cellulitis in the left lower face at the level of the mandible, most likely due to extension of infection from dental disease. No abscess. 3. Lack of pneumatization of the piriform sinus on the left with no visible mass. 4. No retropharyngeal abscess or edema.   Electronically Signed   By: Gordan Payment M.D.   On: 02/09/2014 22:05     EKG Interpretation None      MDM   Final diagnoses:  Facial cellulitis  Bipolar disorder, unspecified    Patient has normal vital signs is afebrile however does have significant swelling to the left jaw and left side of the face. Submandibular tissue is soft and patient does not appear to have Ludwig angina. No visible peritonsillar abscess. Concern for apical abscess or other infection of the face or jaw. We'll obtain a CT of the neck and face for further characterization.  Patient has elevated white count on laboratory workup normal CMP CT studies show extensive bilateral dental caries with periapical abscess mild  cellulitis of left lower face. No retropharyngeal abscess. Will treat with Unasyn and Decadron the patient's pharyngitis. Patient will likely need admission for IV antibiotics. No indication for surgical drainage at this time.  Patient was discussed with family medicine for admission. Patient will be admitted for further management.  Patient care was discussed with my attending, Dr. Manus Gunning.   Gavin Pound, MD 02/10/14 [phone]

## 2014-02-09 NOTE — ED Notes (Signed)
[facility] x[facility] days. Also reports difficulty swallowing. Reports swelling to L lower jaw and no relief with magic mouth wash. 7/10 pain at the time. Respirations unlabored. Pt is alert and oriented x4.

## 2014-02-09 NOTE — ED Notes (Signed)
C/o sore throat onset Thursday 7/30.  [facility] Fri. She was given something for headache and a mouthwash.  [facility] did not have the mouth rinse.  L lower jaw swelled up last night.  Having difficulty swallowing her saliva and can't open her mouth all the way.

## 2014-02-10 ENCOUNTER — Encounter (HOSPITAL_COMMUNITY): Payer: Self-pay | Admitting: Internal Medicine

## 2014-02-10 ENCOUNTER — Encounter: Payer: Self-pay | Admitting: Family Medicine

## 2014-02-10 DIAGNOSIS — F319 Bipolar disorder, unspecified: Secondary | ICD-10-CM

## 2014-02-10 DIAGNOSIS — L0201 Cutaneous abscess of face: Secondary | ICD-10-CM

## 2014-02-10 DIAGNOSIS — L03211 Cellulitis of face: Secondary | ICD-10-CM | POA: Diagnosis present

## 2014-02-10 LAB — CULTURE, GROUP A STREP: Organism ID, Bacteria: NORMAL

## 2014-02-10 MED ORDER — PREDNISONE 10 MG PO TABS: 10.0000 mg | ORAL_TABLET | Freq: Every day | ORAL | Status: DC

## 2014-02-10 MED ORDER — CEPHALEXIN 500 MG PO CAPS
500.0000 mg | ORAL_CAPSULE | Freq: Four times a day (QID) | ORAL | Status: DC
  Administered 2014-02-10 (×2): 500 mg via ORAL
  Filled 2014-02-10 (×5): qty 1

## 2014-02-10 MED ORDER — CITALOPRAM HYDROBROMIDE 40 MG PO TABS
40.0000 mg | ORAL_TABLET | Freq: Every day | ORAL | Status: DC
  Administered 2014-02-10: 40 mg via ORAL
  Filled 2014-02-10: qty 1

## 2014-02-10 MED ORDER — ONDANSETRON HCL 4 MG PO TABS: 4.0000 mg | ORAL_TABLET | Freq: Four times a day (QID) | ORAL | Status: DC | PRN

## 2014-02-10 MED ORDER — DOCUSATE SODIUM 100 MG PO CAPS
100.0000 mg | ORAL_CAPSULE | Freq: Two times a day (BID) | ORAL | Status: DC
  Filled 2014-02-10 (×2): qty 1

## 2014-02-10 MED ORDER — PREDNISONE 10 MG PO TABS
10.0000 mg | ORAL_TABLET | Freq: Every day | ORAL | Status: DC
  Administered 2014-02-10: 10 mg via ORAL
  Filled 2014-02-10 (×2): qty 1

## 2014-02-10 MED ORDER — SENNA 8.6 MG PO TABS
1.0000 | ORAL_TABLET | Freq: Two times a day (BID) | ORAL | Status: DC
  Filled 2014-02-10 (×2): qty 1

## 2014-02-10 MED ORDER — PHENOL 1.4 % MT LIQD: 1.0000 | OROMUCOSAL | Status: DC | PRN

## 2014-02-10 MED ORDER — ONDANSETRON HCL 4 MG/2ML IJ SOLN: 4.0000 mg | Freq: Four times a day (QID) | INTRAMUSCULAR | Status: DC | PRN

## 2014-02-10 MED ORDER — CHLORHEXIDINE GLUCONATE 0.12 % MT SOLN: 15.0000 mL | Freq: Three times a day (TID) | OROMUCOSAL | Status: DC

## 2014-02-10 MED ORDER — PENICILLIN V POTASSIUM 500 MG PO TABS: 500.0000 mg | ORAL_TABLET | Freq: Four times a day (QID) | ORAL | Status: DC

## 2014-02-10 MED ORDER — CARBAMAZEPINE ER 400 MG PO TB12
400.0000 mg | ORAL_TABLET | Freq: Every day | ORAL | Status: DC
  Administered 2014-02-10: 400 mg via ORAL
  Filled 2014-02-10: qty 1

## 2014-02-10 MED ORDER — HEPARIN SODIUM (PORCINE) 5000 UNIT/ML IJ SOLN
5000.0000 [IU] | Freq: Three times a day (TID) | INTRAMUSCULAR | Status: DC
  Administered 2014-02-10: 5000 [IU] via SUBCUTANEOUS
  Filled 2014-02-10 (×3): qty 1

## 2014-02-10 MED ORDER — WHITE PETROLATUM GEL
Status: AC
  Administered 2014-02-10: 1
  Filled 2014-02-10: qty 5

## 2014-02-10 MED ORDER — CEPHALEXIN 500 MG PO CAPS: 500.0000 mg | ORAL_CAPSULE | Freq: Four times a day (QID) | ORAL | Status: DC

## 2014-02-10 MED ORDER — HYDROCODONE-ACETAMINOPHEN 5-325 MG PO TABS: 1.0000 | ORAL_TABLET | ORAL | Status: DC | PRN

## 2014-02-10 NOTE — ED Provider Notes (Signed)
I saw and evaluated the patient, reviewed the resident's note and I agree with the findings and plan. If applicable, I agree with the resident's interpretation of the EKG.  If applicable, I was present for critical portions of any procedures performed.  [facility] 2 day history of worsening sore throat, facial swelling.  L jaw swollen. Teeth in poor repair but no specific tenderness. +trismus, floor of mouth soft.  Uvula midline, no asymmetry, no meningismus.  Airway patent. Facial cellulitis without discrete abscess. No evidence of ludwig's angina.  Glynn OctaveStephen Mally Gavina, MD 02/10/14 [phone]

## 2014-02-10 NOTE — H&P (Signed)
I have seen and examined this patient. I have discussed with Dr Michail JewelsMarsh.  I agree with their findings and plans as documented in their admission note.  1. Odontogenic Infection - Improved. Able to eat and take oral medication reliably.  - Reactive inflammation of soft tissue of left mandible - No abscess formation on CT - Intact Airway  Patient stable for DC home with Pen VK 500 mg ORAL QID for 10 days Peridex rinse swish and spit THREE TIMES DAILY Give additional day of oral Dexamethasone 4 mg to take in one to two days. Use NSAID for pain Rx for Hydrocodone/APAP as needed breakthru pain  Patient will need note for work.  I would give note to cover the week, as it may take her that much time to get into see an oral surgeon.  Recommend patient schedule with oral surgeon directly, if possible.   Oral surgeons recommended (in no particular order): Dr Dutch Quintodd Owsley, Dr Royston SinnerJoseph Miller, Dr Ocie DoyneScott Jensen.

## 2014-02-10 NOTE — Discharge Summary (Signed)
Family Medicine [facility] Discharge Summary  Patient name: Yvonne BalsamMelvine [name] Medical record number: [id number] Date of birth: February 15, 1966 Age: 48 y.o. Gender: female Date of Admission: 02/09/2014  Date of Discharge: 02/10/14 Admitting Physician: Leighton Roachodd D McDiarmid, MD  Primary Care Provider: Tally DueGUEST, CHRIS WARREN, MD Consultants: discussed with dentistry  Indication for Hospitalization: sore throat, facial infection  Discharge Diagnoses/Problem List:  Facial cellulitis Poor dentition / dental abscess Bipolar I disorder  Disposition: discharge home  Discharge Condition: stable  Brief Hospital Course: Yvonne [name] is a 48 y.o. female presenting with sore throat and extremely poor dentition. PMH is significant for bipolar type I. She was found to have extensive bilateral dental caries and abscesses, felt to be the source of facial cellulitis. She had no evidence for airway compromise throughout the admission. By time of discharge, her pain (minimal) was well-controlled and she had been started on antibiotics and was given prescriptions for a course of Pen VK and a short course of steroids, as well as Peridex and instructions to follow up with an oral surgeon as soon as possible. She was found to be afebrile with otherwise stable vital signs and thus was deemed stable for discharge with details as below.  #Facial cellulitis / poor dentition - s/p Unasyn in the ED and Keflex x1 8/2 AM. No hx of DM or immunocompromised state. Dr. Michail JewelsMarsh spoke with hospital dentist who recommended outpt management. - Pen VK 500 mg four times daily for 10 days, plus prednisone 10mg  for 5 days; avoided higher dose of steroids given bipolar disorder (see below) - Rx also given for Peridex mouth rinses TID - pt provided with names and phone numbers for 3 different oral surgeons to f/u within the week ASAP - work note provided for pt to remain out of work until at least 8/10 (one full week out) in order to be able  to devote time to search for dentist / Designer, industrial/productdental surgeon  #Bipolar- stable on carbamezapine and Celexa; concern for poor follow up 2/2 psych disease. No SI/HI currently  - managed with home meds, continued at discharge; pt encouraged to f/u with outpt providers - provided work note at discharge, as above  Issues for Follow Up:  1. Facial cellulitis - ensure resolution of cellulitic changes and encourage good dental follow-up; pt to complete course of abx and see dental surgery ASAP.  Significant Procedures: none  Significant Labs and Imaging:   Recent Labs Lab 02/08/14 1437 02/09/14 1952  WBC 8.5 10.8*  HGB 12.5 11.6*  HCT 37.1* 34.0*  PLT  --  270    Recent Labs Lab 02/09/14 1952  NA 140  K 3.9  CL 104  CO2 23  GLUCOSE 92  BUN 9  CREATININE 0.64  CALCIUM 9.0  ALKPHOS 97  AST 15  ALT 10  ALBUMIN 3.6   Group A strep culture NEGATIVE  CT Soft tissue neck, with contrast, 8/1 @2150  1. Extensive bilateral dental caries with periapical abscesses.  2. Mild cellulitis in the left lower face at the level of the  mandible, most likely due to extension of infection from dental  disease. No abscess.  3. Lack of pneumatization of the piriform sinus on the left with no  visible mass.  4. No retropharyngeal abscess or edema.  Results/Tests Pending at Time of Discharge: none  Discharge Medications:    Medication List         carbamazepine 400 MG 12 hr tablet  Commonly known as:  TEGRETOL XR  Take 400 mg by mouth daily.     cephALEXin 500 MG capsule  Commonly known as:  KEFLEX  Take 1 capsule (500 mg total) by mouth every 6 (six) hours.     CHLORASEPTIC 1.4 % Liqd  Generic drug:  phenol  Use as directed 1 spray in the mouth or throat as needed for throat irritation / pain.     citalopram 40 MG tablet  Commonly known as:  CELEXA  Take 40 mg by mouth daily.     predniSONE 10 MG tablet  Commonly known as:  DELTASONE  Take 1 tablet (10 mg total) by mouth daily with  breakfast.        Discharge Instructions: Please refer to Patient Instructions section of EMR for full details.  Patient was counseled important signs and symptoms that should prompt return to medical care, changes in medications, dietary instructions, activity restrictions, and follow up appointments.   Follow-Up Appointments:     Follow-up Information   Schedule an appointment as soon as possible for a visit with GUEST, Loretha Stapler, MD. (hospital f/up)    Specialty:  Internal Medicine   Contact information:   [address] [place] [address] [phone]       Bobbye Morton, MD 02/10/2014, 11:33 AM PGY-3, [facility] Family Medicine

## 2014-02-10 NOTE — H&P (Signed)
Family Medicine Teaching [facility] Admission History and Physical Service Pager: [phone]  Patient name: Yvonne [name] Medical record number: [id number] Date of birth: Mar 11, 1966 Age: 48 y.o. Gender: female  Primary Care Provider: Tally DueGUEST, CHRIS WARREN, MD Consultants: none Code Status: full   Chief Complaint:sore throat   Assessment and Plan: Yvonne [name] is a 48 y.o. female presenting with sore throat . PMH is significant for bipolar type I  #Facial cellulitis- likely 2/2 poor peridontal disease; no signs of airway compromise at this time; WBC only slightly increased from yesterday. Admission 2/2 concern for poor patient compliance. S/p unasyn in the ED. Would favor cephalosporin for non purulent, MSSA. (pt without hx of MRSA). No hx of DM or immunocompromised state.  -admit to med-surg under Dr. McDiarmid -start Keflex this morning 500q6 -prednisone 10mg  X5 days -monitor closely for signs of airway compromise -magic mouthwash -vitals per floor protocol  -suspect pt may be able to go home within 24hr period if shows good improvement -serial exams on facial swelling, could consider broadening to dual agent with bactrim or doxy if needed -will need close outpt follow up for caries and extraction   #Bipolar- stable on carbamezapine and celexa, concern for poor follow up 2/2 psych disease. No SI/HI currently -reorder home meds -would avoid overmedicating with steroids  -functional at work: likely will need note on discharge  FEN/GI: Full liquid diet, ADAT Prophylaxis: HSQ  Disposition: admit to med-surg under Dr. McDiarmid   History of Present Illness: Yvonne [name] is a 48 y.o. female presenting with facial swelling and trouble swallowing. Pt with 3 day hx of severe throat pain and left sided neck pain. Presented to UC where she was given norco and magic mouth wash. Rapid strep neg, WBC 8.5 at that time. Pt subsequently seen with worsening of symptoms sent to the ED for CT  neck for concern of peritonsillar or retropharyngeal abscess.   In the ED pt CT with extensive caries and periapical abscess with mild cellulitis of the left lower faces. Started on Unasyn and Decadron. Called for admission 2/2 concern for poor follow up and airway compromise.   Review Of Systems: Per HPI with the following additions: none Otherwise 12 point review of systems was performed and was unremarkable.  Patient Active Problem List   Diagnosis Date Noted  . Facial cellulitis 02/10/2014  . Bipolar I disorder, most recent episode (or current) depressed, severe, without mention of psychotic behavior 04/25/2013  . Bipolar disorder, unspecified 01/02/2013  . MDD (major depressive disorder) 08/02/2012   Past Medical History: Past Medical History  Diagnosis Date  . No pertinent past medical history   . Medical history non-contributory   . Mental disorder   . Bipolar 1 disorder    Past Surgical History: Past Surgical History  Procedure Laterality Date  . No past surgeries     Social History: History  Substance Use Topics  . Smoking status: Never Smoker   . Smokeless tobacco: Never Used  . Alcohol Use: No   Additional social history: none Please also refer to relevant sections of EMR.  Family History: Family History  Problem Relation Age of Onset  . Depression Sister   . Depression Brother   . Bipolar disorder Cousin   . Depression Sister   . CAD Mother   . Hypertension Father   . Diabetes Father    Allergies and Medications: No Known Allergies No current facility-administered medications on file prior to encounter.   Current Outpatient Prescriptions on File  Prior to Encounter  Medication Sig Dispense Refill  . carbamazepine (TEGRETOL XR) 400 MG 12 hr tablet Take 400 mg by mouth daily.         Objective: BP 104/53  Pulse 92  Temp(Src) 99.7 F (37.6 C) (Oral)  Resp 16  SpO2 97% Exam: General: awake alert conversant, cannot open mouth widely  HEENT:  NCAT, sig left lower facial swelling with component of erythema (mild), dental caries present, no purulent drainage  Cardiovascular: RRR, no m/r/g Respiratory: CTAB, no wheezes or stridor, moving air comfortably Abdomen: SNTND, normoactive Extremities: WWP Skin: no signs of break down, no rash Neuro: alert and oriented   Labs and Imaging: CBC BMET   Recent Labs Lab 02/09/14 1952  WBC 10.8*  HGB 11.6*  HCT 34.0*  PLT 270    Recent Labs Lab 02/09/14 1952  NA 140  K 3.9  CL 104  CO2 23  BUN 9  CREATININE 0.64  GLUCOSE 92  CALCIUM 9.0     CT head/neck IMPRESSION:  1. Extensive bilateral dental caries with periapical abscesses.  2. Mild cellulitis in the left lower face at the level of the  mandible, most likely due to extension of infection from dental  disease. No abscess.  3. Lack of pneumatization of the piriform sinus on the left with no  visible mass.  4. No retropharyngeal abscess or edema   Anselm Lis, MD 02/10/2014, 12:27 AM PGY-2, [facility] Family Medicine FPTS Intern pager: [phone], text pages welcome\

## 2014-02-10 NOTE — Discharge Instructions (Signed)
ORAL SURGEON LIST: Dr Dutch Quintodd Owsley [address] [place], [place] [address] [phone]  Dr Royston SinnerJoseph Miller [address] [place], [place] [address] [phone]  Dr Ocie DoyneScott Jensen [address] [place], [place] [address] [phone]  Yvonne Amedeo GoryJones  You were seen in the hospital for concern of facial swelling and found to have cellulitis and infection of your teeth  You need to continue to take the antibiotics and oral steroids to help this infection get better.  Please f/up closely with a dentist to have teeth extracted.  If you have any shortness of breath, worsening swelling or throat pain please call [phone] immediately as these could be signs of an infection  Feel better soon. Thanks for letting Yvonne [name] take care of you! Cellulitis Cellulitis is an infection of the skin and the tissue beneath it. The infected area is usually red and tender. Cellulitis occurs most often in the arms and lower legs.  CAUSES  Cellulitis is caused by bacteria that enter the skin through cracks or cuts in the skin. The most common types of bacteria that cause cellulitis are staphylococci and streptococci. SIGNS AND SYMPTOMS   Redness and warmth.  Swelling.  Tenderness or pain.  Fever. DIAGNOSIS  Your health care provider can usually determine what is wrong based on a physical exam. Blood tests may also be done. TREATMENT  Treatment usually involves taking an antibiotic medicine. HOME CARE INSTRUCTIONS   Take your antibiotic medicine as directed by your health care provider. Finish the antibiotic even if you start to feel better.  Keep the infected arm or leg elevated to reduce swelling.  Apply a warm cloth to the affected area up to 4 times per day to relieve pain.  Take medicines only as directed by your health care provider.  Keep all follow-up visits as directed by your health care provider. SEEK MEDICAL CARE IF:   You notice red streaks coming from the infected area.  Your red area gets  larger or turns dark in color.  Your bone or joint underneath the infected area becomes painful after the skin has healed.  Your infection returns in the same area or another area.  You notice a swollen bump in the infected area.  You develop new symptoms.  You have a fever. SEEK IMMEDIATE MEDICAL CARE IF:   You feel very sleepy.  You develop vomiting or diarrhea.  You have a general ill feeling (malaise) with muscle aches and pains. MAKE SURE YOU:   Understand these instructions.  Will watch your condition.  Will get help right away if you are not doing well or get worse. Document Released: 04/07/2005 Document Revised: 11/12/2013 Document Reviewed: 09/13/2011 [facility] Patient Information 2015 [employer] [place]. This information is not intended to replace advice given to you by your health care provider. Make sure you discuss any questions you have with your health care provider.  Abscess Care After An abscess (also called a boil or furuncle) is an infected area that contains a collection of pus. Signs and symptoms of an abscess include pain, tenderness, redness, or hardness, or you may feel a moveable soft area under your skin. An abscess can occur anywhere in the body. The infection may spread to surrounding tissues causing cellulitis. A cut (incision) by the surgeon was made over your abscess and the pus was drained out. Gauze may have been packed into the space to provide a drain that will allow the cavity to heal from the inside outwards. The  boil may be painful for 5 to 7 days. Most people with a boil do not have high fevers. Your abscess, if seen early, may not have localized, and may not have been lanced. If not, another appointment may be required for this if it does not get better on its own or with medications. HOME CARE INSTRUCTIONS   Only take over-the-counter or prescription medicines for pain, discomfort, or fever as directed by your caregiver.  When you bathe, soak  and then remove gauze or iodoform packs at least daily or as directed by your caregiver. You may then wash the wound gently with mild soapy water. Repack with gauze or do as your caregiver directs. SEEK IMMEDIATE MEDICAL CARE IF:   You develop increased pain, swelling, redness, drainage, or bleeding in the wound site.  You develop signs of generalized infection including muscle aches, chills, fever, or a general ill feeling.  An oral temperature above 102 F (38.9 C) develops, not controlled by medication. See your caregiver for a recheck if you develop any of the symptoms described above. If medications (antibiotics) were prescribed, take them as directed. Document Released: 01/14/2005 Document Revised: 09/20/2011 Document Reviewed: 09/11/2007 [facility] Patient Information 2015 [place], [place]. This information is not intended to replace advice given to you by your health care provider. Make sure you discuss any questions you have with your health care provider.

## 2014-02-10 NOTE — ED Notes (Signed)
Dr. Doutova at bedside.  

## 2014-02-10 NOTE — Progress Notes (Signed)
New Admission Note:   Arrival Method: Via stretcher from the ED with Mauri Brooklynan Williams, EMT Mental Orientation: Alert and oriented X4 Telemetry: N/A Assessment: Completed Skin: Warm, dry and intact. Slight red streaks on left arm from scratching where blood pressure cuff is located IV: Clean, dry and intact. Normal saline locked at this time Pain: 7 out of 10, left side of face and throat Tubes: N/A Safety Measures: Safety Fall Prevention Plan has been given, discussed and signed Admission: Completed [address] [place] Orientation: Patient has been orientated to the room, unit and staff.  Family: None at bedside at this time  Orders have been reviewed and implemented. Will continue to monitor the patient. Call light has been placed within reach and bed alarm has not been activated due to her being a low fall risk.   Qusay Villada Loleta ChanceHill BSN, RN  Phone number: [phone] (late entry)

## 2014-02-11 NOTE — Discharge Summary (Signed)
I have seen and examined this patient. I have discussed with Dr Street.  I agree with their findings and plans as documented in their progress note.    

## 2014-02-18 ENCOUNTER — Telehealth: Payer: Self-pay | Admitting: Family Medicine

## 2014-02-18 NOTE — Telephone Encounter (Signed)
Earley AbideMaria Hayes from the [employer] called and stated that the letter that Dr. Casper HarrisonStreet wrote on 8/2 for Gypsy BalsamMelvine Adamek when she had an ER visit needs to re-written to state that she can return to work on 8/10 with no restrictions for her to be able to return to work. Please call Earley AbideMaria Hayes at [phone] if you have any questions and when letter is ready please fax to [phone]. Myriam Jacobsonjw

## 2014-02-19 ENCOUNTER — Encounter: Payer: Self-pay | Admitting: Family Medicine

## 2014-02-19 NOTE — Telephone Encounter (Signed)
Letter rewritten to include return to work as of 8/10 without restrictions, as well as outline of plan as of discharge date 8/2 for pt to complete oral steroids and antibiotics, follow up with oral surgeon, and follow up with [facility]. Letter included contact information for Dr. Perrin MalteseGuest. Letter also stated that further follow-up care / plans / correspondence should be directed by Dr. 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  Also responded to fax from [employer] requesting discharge summary / treatment plan / current meds and treatments / etc; included recent discharge summary which details plans as of discharge date 8/2 and included a copy of the letter mentioned above.  Bobbye Mortonhristopher M Street, MD PGY-3, [facility] 02/19/2014, 2:12 PM

## 2014-02-27 ENCOUNTER — Emergency Department (HOSPITAL_COMMUNITY)
Admission: EM | Admit: 2014-02-27 | Discharge: 2014-02-27 | Disposition: A | Discharge status: Home / Self Care | Payer: 59 | Attending: Emergency Medicine | Admitting: Emergency Medicine

## 2014-02-27 ENCOUNTER — Encounter (HOSPITAL_COMMUNITY): Payer: Self-pay | Admitting: Emergency Medicine

## 2014-02-27 DIAGNOSIS — R1084 Generalized abdominal pain: Secondary | ICD-10-CM | POA: Diagnosis present

## 2014-02-27 DIAGNOSIS — F319 Bipolar disorder, unspecified: Secondary | ICD-10-CM | POA: Insufficient documentation

## 2014-02-27 DIAGNOSIS — Z792 Long term (current) use of antibiotics: Secondary | ICD-10-CM | POA: Diagnosis not present

## 2014-02-27 DIAGNOSIS — IMO0002 Reserved for concepts with insufficient information to code with codable children: Secondary | ICD-10-CM | POA: Insufficient documentation

## 2014-02-27 DIAGNOSIS — Z79899 Other long term (current) drug therapy: Secondary | ICD-10-CM | POA: Insufficient documentation

## 2014-02-27 DIAGNOSIS — Z3202 Encounter for pregnancy test, result negative: Secondary | ICD-10-CM | POA: Diagnosis not present

## 2014-02-27 DIAGNOSIS — R1013 Epigastric pain: Secondary | ICD-10-CM | POA: Insufficient documentation

## 2014-02-27 LAB — COMPREHENSIVE METABOLIC PANEL
ALT: 17 U/L (ref 0–35)
ANION GAP: 13 (ref 5–15)
AST: 23 U/L (ref 0–37)
Albumin: 3.5 g/dL (ref 3.5–5.2)
Alkaline Phosphatase: 101 U/L (ref 39–117)
BILIRUBIN TOTAL: 0.2 mg/dL — AB (ref 0.3–1.2)
BUN: 13 mg/dL (ref 6–23)
CALCIUM: 9.1 mg/dL (ref 8.4–10.5)
CO2: 25 mEq/L (ref 19–32)
Chloride: 101 mEq/L (ref 96–112)
Creatinine, Ser: 0.7 mg/dL (ref 0.50–1.10)
GFR calc Af Amer: >90 mL/min (ref >90)
GFR calc non Af Amer: >90 mL/min (ref >90)
Glucose, Bld: 108 mg/dL — ABNORMAL HIGH (ref 70–99)
Potassium: 3.7 mEq/L (ref 3.7–5.3)
Sodium: 139 mEq/L (ref 137–147)
TOTAL PROTEIN: 7.4 g/dL (ref 6.0–8.3)

## 2014-02-27 LAB — URINALYSIS, ROUTINE W REFLEX MICROSCOPIC
Bilirubin Urine: NEGATIVE
GLUCOSE, UA: NEGATIVE mg/dL
KETONES UR: NEGATIVE mg/dL
LEUKOCYTES UA: NEGATIVE
Nitrite: NEGATIVE
PROTEIN: NEGATIVE mg/dL
Specific Gravity, Urine: 1.015 (ref 1.005–1.030)
Urobilinogen, UA: 0.2 mg/dL (ref 0.0–1.0)
pH: 6 (ref 5.0–8.0)

## 2014-02-27 LAB — CBC WITH DIFFERENTIAL/PLATELET
BASOS PCT: 0 % (ref 0–1)
Basophils Absolute: 0 10*3/uL (ref 0.0–0.1)
Eosinophils Absolute: 0.1 10*3/uL (ref 0.0–0.7)
Eosinophils Relative: 1 % (ref 0–5)
HCT: 35.5 % — ABNORMAL LOW (ref 36.0–46.0)
Hemoglobin: 12 g/dL (ref 12.0–15.0)
LYMPHS ABS: 1.4 10*3/uL (ref 0.7–4.0)
LYMPHS PCT: 15 % (ref 12–46)
MCH: 28.1 pg (ref 26.0–34.0)
MCHC: 33.8 g/dL (ref 30.0–36.0)
MCV: 83.1 fL (ref 78.0–100.0)
Monocytes Absolute: 0.6 10*3/uL (ref 0.1–1.0)
Monocytes Relative: 6 % (ref 3–12)
NEUTROS ABS: 7.1 10*3/uL (ref 1.7–7.7)
NEUTROS PCT: 78 % — AB (ref 43–77)
PLATELETS: 322 10*3/uL (ref 150–400)
RBC: 4.27 MIL/uL (ref 3.87–5.11)
RDW: 13 % (ref 11.5–15.5)
WBC: 9.2 10*3/uL (ref 4.0–10.5)

## 2014-02-27 LAB — URINE MICROSCOPIC-ADD ON

## 2014-02-27 LAB — POC URINE PREG, ED: Preg Test, Ur: NEGATIVE

## 2014-02-27 LAB — I-STAT TROPONIN, ED: Troponin i, poc: 0 ng/mL (ref 0.00–0.08)

## 2014-02-27 LAB — LIPASE, BLOOD: Lipase: 37 U/L (ref 11–59)

## 2014-02-27 MED ORDER — FAMOTIDINE 40 MG PO TABS: 40.0000 mg | ORAL_TABLET | Freq: Every day | ORAL | Status: DC

## 2014-02-27 MED ORDER — ONDANSETRON 4 MG PO TBDP: 4.0000 mg | ORAL_TABLET | Freq: Three times a day (TID) | ORAL | Status: DC | PRN

## 2014-02-27 NOTE — ED Provider Notes (Signed)
Medical screening examination/treatment/procedure(s) were performed by non-physician practitioner and as supervising physician I was immediately available for consultation/collaboration.   EKG Interpretation None       Glynn OctaveStephen Borna Wessinger, MD 02/27/14 [phone]

## 2014-02-27 NOTE — ED Notes (Signed)
The patient says she ate some food that did not agree with her. She says she just started having the pain and had some diarrhea.  Denies N/V or any other symptoms.  She said she took some pepto bismol but it has not helped.

## 2014-02-27 NOTE — ED Notes (Signed)
Pt A&OX4, ambulatory at d/c with steady gait, NAD, reporting she feels much better. 

## 2014-02-27 NOTE — Discharge Instructions (Signed)
Please read and follow all provided instructions.  Your diagnoses today include:  1. Epigastric pain     Tests performed today include:  Blood counts and electrolytes  Blood tests to check liver and kidney function  Blood tests to check pancreas function  Urine test to look for infection and pregnancy (in women)  Vital signs. See below for your results today.   Medications prescribed:   Pepcid (famotidine) - antihistamine  You can find this medication over-the-counter.   DO NOT exceed:   40mg  Pepcid every 24 hours   Zofran (ondansetron) - for nausea and vomiting  Take any prescribed medications only as directed.  Home care instructions:   Follow any educational materials contained in this packet.  Follow-up instructions: Please follow-up with your primary care provider in the next 3 days for further evaluation of your symptoms.    Return instructions:  SEEK IMMEDIATE MEDICAL ATTENTION IF:  The pain does not go away or becomes severe   A temperature above 101F develops   Repeated vomiting occurs (multiple episodes)   The pain becomes localized to portions of the abdomen. The right side could possibly be appendicitis. In an adult, the left lower portion of the abdomen could be colitis or diverticulitis.   Blood is being passed in stools or vomit (bright red or black tarry stools)   You develop chest pain, difficulty breathing, dizziness or fainting, or become confused, poorly responsive, or inconsolable (young children)  If you have any other emergent concerns regarding your health  Additional Information: Abdominal (belly) pain can be caused by many things. Your caregiver performed an examination and possibly ordered blood/urine tests and imaging (CT scan, x-rays, ultrasound). Many cases can be observed and treated at home after initial evaluation in the emergency department. Even though you are being discharged home, abdominal pain can be unpredictable.  Therefore, you need a repeated exam if your pain does not resolve, returns, or worsens. Most patients with abdominal pain don't have to be admitted to the hospital or have surgery, but serious problems like appendicitis and gallbladder attacks can start out as nonspecific pain. Many abdominal conditions cannot be diagnosed in one visit, so follow-up evaluations are very important.  Your vital signs today were: BP 101/68   Pulse 71   Temp(Src) 97.6 F (36.4 C) (Oral)   Resp 18   SpO2 100% If your blood pressure (bp) was elevated above 135/85 this visit, please have this repeated by your doctor within one month. --------------

## 2014-02-27 NOTE — ED Provider Notes (Signed)
CSN: [id number]     Arrival date & time 02/27/14  1478 History   First MD Initiated Contact with Patient 02/27/14 [phone]     Chief Complaint  Patient presents with  . Abdominal Pain    The patient says she ate some food that did not agree with her. She says she just started having the pain and had some diarrhea.  Denies N/V or any other symptoms.    (Consider location/radiation/quality/duration/timing/severity/associated sxs/prior Treatment) HPI Comments: Patient with no past surgical history -- presents with onset of generalized abdominal pain early this morning. Pain woke her from sleep. Patient had nonbloody diarrhea prior to the pain. Diarrhea has since resolved. She denies fever, vomiting, dysuria or hematuria. No treatments prior to arrival. The symptoms are improved. No history of gallbladder problems. She attributes her symptoms to food that she ate however no other family members have similar symptoms. Denies heavy NSAID or alcohol use.  The history is provided by the patient.    Past Medical History  Diagnosis Date  . No pertinent past medical history   . Medical history non-contributory   . Mental disorder   . Bipolar 1 disorder    Past Surgical History  Procedure Laterality Date  . No past surgeries     Family History  Problem Relation Age of Onset  . Depression Sister   . Depression Brother   . Bipolar disorder Cousin   . Depression Sister   . CAD Mother   . Hypertension Father   . Diabetes Father    History  Substance Use Topics  . Smoking status: Never Smoker   . Smokeless tobacco: Never Used  . Alcohol Use: No   OB History   Grav Para Term Preterm Abortions TAB SAB Ect Mult Living                 Review of Systems  Constitutional: Negative for fever.  HENT: Negative for rhinorrhea and sore throat.   Eyes: Negative for redness.  Respiratory: Negative for cough.   Cardiovascular: Negative for chest pain.  Gastrointestinal: Positive for abdominal pain.  Negative for nausea, vomiting and diarrhea.  Genitourinary: Negative for dysuria and hematuria.  Musculoskeletal: Negative for myalgias.  Skin: Negative for rash.  Neurological: Negative for headaches.    Allergies  Review of patient's allergies indicates no known allergies.  Home Medications   Prior to Admission medications   Medication Sig Start Date End Date Taking? Authorizing Provider  carbamazepine (TEGRETOL XR) 400 MG 12 hr tablet Take 400 mg by mouth daily.    Yes Historical Provider, MD  chlorhexidine (PERIDEX) 0.12 % solution Use as directed 15 mLs in the mouth or throat 3 (three) times daily. Rinse and spit. 02/10/14  Yes Stephanie Coup Street, MD  citalopram (CELEXA) 40 MG tablet Take 40 mg by mouth daily.   Yes Historical Provider, MD  phenol (CHLORASEPTIC) 1.4 % LIQD Use as directed 1 spray in the mouth or throat as needed for throat irritation / pain.   Yes Historical Provider, MD  penicillin v potassium (VEETID) 500 MG tablet Take 1 tablet (500 mg total) by mouth 4 (four) times daily. 02/10/14   Stephanie Coup Street, MD  predniSONE (DELTASONE) 10 MG tablet Take 1 tablet (10 mg total) by mouth daily with breakfast. 02/10/14   Charlane Ferretti, MD   BP 108/55  Pulse 77  Temp(Src) 97.6 F (36.4 C) (Oral)  Resp 18  SpO2 100%  Physical Exam  Nursing note and  vitals reviewed. Constitutional: She appears well-developed and well-nourished.  HENT:  Head: Normocephalic and atraumatic.  Eyes: Conjunctivae are normal. Right eye exhibits no discharge. Left eye exhibits no discharge.  Neck: Normal range of motion. Neck supple.  Cardiovascular: Normal rate, regular rhythm and normal heart sounds.   No murmur heard. Pulmonary/Chest: Effort normal and breath sounds normal. No respiratory distress. She has no wheezes. She has no rales.  Abdominal: Soft. Bowel sounds are normal. She exhibits no distension. There is no tenderness. There is no rebound and no guarding.  Neurological: She is  alert.  Skin: Skin is warm and dry.  Psychiatric: She has a normal mood and affect.    ED Course  Procedures (including critical care time) Labs Review Labs Reviewed  CBC WITH DIFFERENTIAL - Abnormal; Notable for the following:    HCT 35.5 (*)    Neutrophils Relative % 78 (*)    All other components within normal limits  COMPREHENSIVE METABOLIC PANEL - Abnormal; Notable for the following:    Glucose, Bld 108 (*)    Total Bilirubin 0.2 (*)    All other components within normal limits  URINALYSIS, ROUTINE W REFLEX MICROSCOPIC - Abnormal; Notable for the following:    Hgb urine dipstick SMALL (*)    All other components within normal limits  LIPASE, BLOOD  URINE MICROSCOPIC-ADD ON  I-STAT TROPOININ, ED  POC URINE PREG, ED    Imaging Review No results found.   EKG Interpretation None      6:05 AM Patient seen and examined. Work-up reviewed. Symptoms resolved. Will defer imaging at this time. Discussed that if she has recurrent symptoms she may need ultrasound in future.   Vital signs reviewed and are as follows: BP 108/55  Pulse 77  Temp(Src) 97.6 F (36.4 C) (Oral)  Resp 18  SpO2 100%  Patient tolerated PO. Will discharge to home with Pepcid and Zofran for symptomatic control.  The patient was urged to return to the Emergency Department immediately with worsening of current symptoms, worsening abdominal pain, persistent vomiting, blood noted in stools, fever, or any other concerns. The patient verbalized understanding.    MDM   Final diagnoses:  Epigastric pain   Patient with no past history of abdominal pain or surgeries presents with epigastric pain that was transient and now resolved. She is tolerating PO's. She does not a fever. Her abdomen is soft and nontender. Her lab work is reassuring. At this point, will defer any further imaging or workup to her primary care physician. She may need an abdominal ultrasound if symptoms return or become more frequent.  Appropriate return instructions discussed with patient. She seems reliable to return if worsening.  No dangerous or life-threatening conditions suspected or identified by history, physical exam, and by work-up. No indications for hospitalization identified.      Renne CriglerJoshua Surie Suchocki, PA-C 02/27/14 [phone]

## 2014-11-16 ENCOUNTER — Ambulatory Visit (INDEPENDENT_AMBULATORY_CARE_PROVIDER_SITE_OTHER): Payer: 59 | Admitting: Physician Assistant

## 2014-11-16 VITALS — BP 120/80 | HR 83 | Temp 98.2°F | Resp 16 | Ht 62.0 in | Wt 157.0 lb

## 2014-11-16 DIAGNOSIS — H109 Unspecified conjunctivitis: Secondary | ICD-10-CM | POA: Diagnosis not present

## 2014-11-16 NOTE — Patient Instructions (Signed)
Renu rewetting drops. If you are not getting better by Monday, give me a phone call and I will send antibiotics for your eye. No other eye drops. Apply warm compresses.

## 2014-11-16 NOTE — Progress Notes (Signed)
Subjective:    Patient ID: Yvonne [name], female    DOB: 1966/04/13, 49 y.o.   MRN: [id number]  HPI  This is a 49 year old female who is presenting with left eye redness x 2 days. Eye is itchy, no pain. Had small amount "goopy" drainage from eye yesterday. She applied an allergy eye drop before bed and slept with cool compresses over eye. This morning she woke with crusting over left eye. No drainage from eye today. She states she recently noticed her right eye is starting to get red. She denies blurred vision. No history of allergies. No recent URI symptoms.   Review of Systems  Constitutional: Negative for fever and chills.  HENT: Negative for congestion, rhinorrhea and sore throat.   Eyes: Positive for discharge, redness and itching. Negative for visual disturbance.  Respiratory: Negative for cough.   Gastrointestinal: Negative for nausea and vomiting.  Skin: Negative for color change and rash.  Allergic/Immunologic: Negative for environmental allergies.  Hematological: Negative for adenopathy.   Patient Active Problem List   Diagnosis Date Noted  . Facial cellulitis 02/10/2014  . Bipolar I disorder, most recent episode (or current) depressed, severe, without mention of psychotic behavior 04/25/2013  . Bipolar disorder, unspecified 01/02/2013  . MDD (major depressive disorder) 08/02/2012   Prior to Admission medications   Medication Sig Start Date End Date Taking? Authorizing Provider  carbamazepine (TEGRETOL XR) 400 MG 12 hr tablet Take 400 mg by mouth daily.    Yes Historical Provider, MD  citalopram (CELEXA) 40 MG tablet Take 40 mg by mouth daily.   Yes Historical Provider, MD          No Known Allergies  Patient's social and family history were reviewed.     Objective:   Physical Exam  Constitutional: She is oriented to person, place, and time. She appears well-developed and well-nourished. No distress.  HENT:  Head: Normocephalic and atraumatic.  Right Ear: Hearing,  tympanic membrane, external ear and ear canal normal.  Left Ear: Hearing, tympanic membrane, external ear and ear canal normal.  Nose: Nose normal.  Mouth/Throat: Uvula is midline, oropharynx is clear and moist and mucous membranes are normal.  Eyes: EOM are normal. Pupils are equal, round, and reactive to light. Right eye exhibits no discharge and no exudate. Left eye exhibits discharge (watery). Left eye exhibits no exudate. Right conjunctiva is injected (only medially). Left conjunctiva is injected. No scleral icterus.  No purulent discharge from either eye  Cardiovascular: Normal rate, regular rhythm, normal heart sounds and normal pulses.   No murmur heard. Pulmonary/Chest: Effort normal and breath sounds normal. No respiratory distress. She has no wheezes. She has no rhonchi. She has no rales.  Musculoskeletal: Normal range of motion.  Neurological: She is alert and oriented to person, place, and time.  Skin: Skin is warm, dry and intact. No lesion and no rash noted.  Psychiatric: She has a normal mood and affect. Her speech is normal and behavior is normal. Thought content normal.   BP 120/80 mmHg  Pulse 83  Temp(Src) 98.2 F (36.8 C) (Oral)  Resp 16  Ht 5\' 2"  (1.575 m)  Wt 157 lb (71.215 kg)  BMI 28.71 kg/m2  SpO2 98%   Visual Acuity Screening   Right eye Left eye Both eyes  Without correction: 20/13-1 20/13-1 20/13-1  With correction:         Assessment & Plan:  1. Conjunctivitis of left eye Likely d/t virus. Possibly spreading to right  eye. No purulence noted on exam. Pt was not particularly happy with this diagnosis. I told her to use rewetting drops and warm compresses and if symptoms are not improving by 5/9 she can call and I will send in abx.   Roswell MinersNicole V. Dyke BrackettBush, PA-C, MHS [facility]  11/16/2014

## 2015-09-23 ENCOUNTER — Ambulatory Visit: Payer: 59 | Attending: Internal Medicine | Admitting: Internal Medicine

## 2015-09-23 ENCOUNTER — Encounter: Payer: Self-pay | Admitting: Internal Medicine

## 2015-09-23 VITALS — BP 118/79 | HR 75 | Temp 98.3°F | Resp 16 | Ht 62.0 in | Wt 148.0 lb

## 2015-09-23 DIAGNOSIS — H8110 Benign paroxysmal vertigo, unspecified ear: Secondary | ICD-10-CM | POA: Diagnosis not present

## 2015-09-23 DIAGNOSIS — R42 Dizziness and giddiness: Secondary | ICD-10-CM | POA: Insufficient documentation

## 2015-09-23 DIAGNOSIS — F319 Bipolar disorder, unspecified: Secondary | ICD-10-CM

## 2015-09-23 DIAGNOSIS — Z79899 Other long term (current) drug therapy: Secondary | ICD-10-CM | POA: Insufficient documentation

## 2015-09-23 MED ORDER — MECLIZINE HCL 12.5 MG PO TABS: 12.5000 mg | ORAL_TABLET | Freq: Three times a day (TID) | ORAL | Status: DC | PRN

## 2015-09-23 MED ORDER — CITALOPRAM HYDROBROMIDE 40 MG PO TABS: 40.0000 mg | ORAL_TABLET | Freq: Every day | ORAL | Status: DC

## 2015-09-23 MED ORDER — PROMETHAZINE HCL 12.5 MG PO TABS: 12.5000 mg | ORAL_TABLET | Freq: Three times a day (TID) | ORAL | Status: DC | PRN

## 2015-09-23 MED FILL — PROMETHAZINE 25 MG TABLET: 25 | 10 days supply | Qty: 10 | Fill #0

## 2015-09-23 MED FILL — TRAVEL SICKNESS 25 MG TAB C: 25 | 5 days supply | Qty: 15 | Fill #0

## 2015-09-23 MED FILL — CITALOPRAM HBR 40 MG TABLET: 40 | 15 days supply | Qty: 15 | Fill #0

## 2015-09-23 NOTE — Progress Notes (Signed)
Patient c/o feeling dizzy last night. Experiencing off and on dizziness with lying, sitting and standing. No pain.  Patient reports feeling nausea this morning.  Patient hasn't taken her meds this morning.  Patient requesting refill on citalopram.

## 2015-09-23 NOTE — Progress Notes (Addendum)
Yvonne [name], is a 50 y.o. female  KGM:[id number]  UYQ:[id number]  DOB - 01-15-1966  CC:  Chief Complaint  Patient presents with  . Dizziness       HPI: Yvonne [name] is a 50 y.o. female here today to establish medical care.  Went to work last night feeling well, went home and started noticing mild ha and room spinning.  Got up last night to go to bathroom and room continued to spin, she had to hold onto something to steady herself.   Dizzy symptoms remained this am, w/ associated mild nausea and subjective coldness.  HA better.  Patient has No headache, No chest pain, No abdominal pain - No Nausea, No new weakness tingling or numbness, No Cough - SOB.  No sinus congestion, but "her nose feels cold"  Thought sx due to her running out of her citalopram since Feb.  Denies si/hi/ah/vh, good spirits, denies feeling depressed today.  Has appt w/ her neurologist? Dr Jannifer Franklin end of this month. Has had difficulties seeing him due to financial circumstances.   No Known Allergies Past Medical History  Diagnosis Date  . No pertinent past medical history   . Medical history non-contributory   . Mental disorder   . Bipolar 1 disorder [facility])    Current Outpatient Prescriptions on File Prior to Visit  Medication Sig Dispense Refill  . carbamazepine (TEGRETOL XR) 400 MG 12 hr tablet Take 400 mg by mouth daily.     . famotidine (PEPCID) 40 MG tablet Take 1 tablet (40 mg total) by mouth daily. (Patient not taking: Reported on 11/16/2014) 10 tablet 0   No current facility-administered medications on file prior to visit.   Family History  Problem Relation Age of Onset  . Depression Sister   . Depression Brother   . Bipolar disorder Cousin   . Depression Sister   . CAD Mother   . Hypertension Father   . Diabetes Father    Social History   Social History  . Marital Status: Single    Spouse Name: N/A  . Number of Children: N/A  . Years of Education: N/A   Occupational History  . Not on  file.   Social History Main Topics  . Smoking status: Never Smoker   . Smokeless tobacco: Never Used  . Alcohol Use: No  . Drug Use: No  . Sexual Activity: No   Other Topics Concern  . Not on file   Social History Narrative    Review of Systems: Constitutional: Negative for fever,  diaphoresis, activity change, appetite change and fatigue.  +subjective chills HENT: Negative for ear pain, nosebleeds, congestion, facial swelling, rhinorrhea, neck pain, neck stiffness and ear discharge.  Eyes: Negative for pain, discharge, redness, itching and visual disturbance. Respiratory: Negative for cough, choking, chest tightness, shortness of breath, wheezing and stridor.  Cardiovascular: Negative for chest pain, palpitations and leg swelling. Gastrointestinal: Negative for abdominal distention. Genitourinary: Negative for dysuria, urgency, frequency, hematuria, flank pain, decreased urine volume, difficulty urinating and dyspareunia.  Musculoskeletal: Negative for back pain, joint swelling, arthralgia and gait problem. Neurological: Negative for tremors, seizures, syncope, facial asymmetry, speech difficulty, weakness, light-headedness, numbness and headaches.   +room spinning, maybe worse on left ear when turns head, dizzy Hematological: Negative for adenopathy. Does not bruise/bleed easily. Psychiatric/Behavioral: Negative for hallucinations, behavioral problems, confusion, dysphoric mood, decreased concentration and agitation.    Objective:   Filed Vitals:   09/23/15 1102 09/23/15 1103  BP: 101/69 118/79  Pulse: 63  75  Temp:    Resp:     Negative orthostatics.  Physical Exam: Constitutional: Patient appears well-developed and well-nourished. No distress.  Lying in bed, covered in sweater.  Concerned about the dizzy spells.  aaox 3. pleasant HENT: Normocephalic, atraumatic, External right and left ear normal. bilat tm clear.  Oropharynx is clear and moist.  No facial/max sinus  ttp. Eyes: Conjunctivae and EOM are normal. PERRL, no scleral icterus. Neck: Normal ROM. Neck supple. No JVD. No tracheal deviation. No thyromegaly. CVS: RRR, S1/S2 +, no murmurs, no gallops, no carotid bruit.  Pulmonary: Effort and breath sounds normal, no stridor, rhonchi, wheezes, rales.  Abdominal: Soft. BS +, no distension, tenderness, rebound or guarding.  Musculoskeletal: Normal range of motion. No edema and no tenderness.  Lymphadenopathy: No lymphadenopathy noted, cervical, inguinal or axillary Neuro: Alert. Normal reflexes, muscle tone coordination. No cranial nerve deficit.  aaox 3. Skin: Skin is warm and dry. No rash noted. Not diaphoretic. No erythema. No pallor. Psychiatric: Normal mood and affect. Behavior, judgment, thought content normal.  Lab Results  Component Value Date   WBC 9.2 02/27/2014   HGB 12.0 02/27/2014   HCT 35.5* 02/27/2014   MCV 83.1 02/27/2014   PLT 322 02/27/2014   Lab Results  Component Value Date   CREATININE 0.70 02/27/2014   BUN 13 02/27/2014   NA 139 02/27/2014   K 3.7 02/27/2014   CL 101 02/27/2014   CO2 25 02/27/2014    Lab Results  Component Value Date   HGBA1C 5.5 06/21/2012   Lipid Panel     Component Value Date/Time   CHOL 166 06/21/2012 0633   TRIG 58 06/21/2012 0633   HDL 58 06/21/2012 0633   CHOLHDL 2.9 06/21/2012 0633   VLDL 12 06/21/2012 0633   LDLCALC 96 06/21/2012 0633       Assessment and plan:   1. Benign paroxysmal positional vertigo, unspecified laterality, likely 2nd to viral uri. Will try: - meclizine (ANTIVERT) 12.5 MG tablet; Take 1 tablet (12.5 mg total) by mouth 3 (three) times daily as needed for dizziness.  Dispense: 30 tablet; Refill: 0 - promethazine (PHENERGAN) 12.5 MG tablet; Take 1 tablet (12.5 mg total) by mouth every 8 (eight) hours as needed for nausea or vomiting.  Dispense: 20 tablet; Refill: 0  2. Bipolar disorder, unspecified (HCC) - per pt, has appt w/ her neurologist, Dr Jannifer FranklinAkintayo end of  month - will prescribe enough citalopram til her appt end of month. - citalopram (CELEXA) 40 MG tablet; Take 1 tablet (40 mg total) by mouth daily.  Dispense: 15 tablet; Refill: 0   Return in about 2 weeks (around 10/07/2015).  The patient was given clear instructions to go to ER or return to medical center if symptoms don't improve, worsen or new problems develop. The patient verbalized understanding. The patient was told to call to get lab results if they haven't heard anything in the next week.       Pete Glatterawn T Chelsey Redondo, MD, MBA/MHA [facility] [place], [place] [phone]   09/23/2015, 11:16 AM   3/14 at 1126am, correction , Dr Jannifer FranklinAkintayo is Pschiastrist per chart. Will cc him.

## 2015-09-23 NOTE — Patient Instructions (Signed)
-   followup with Dr Jannifer FranklinAkintayo end of month - pick up rx - no driving until vertigo improves.  Vertigo Vertigo means you feel like you or your surroundings are moving when they are not. Vertigo can be dangerous if it occurs when you are at work, driving, or performing difficult activities.  CAUSES  Vertigo occurs when there is a conflict of signals sent to your brain from the visual and sensory systems in your body. There are many different causes of vertigo, including:  Infections, especially in the inner ear.  A bad reaction to a drug or misuse of alcohol and medicines.  Withdrawal from drugs or alcohol.  Rapidly changing positions, such as lying down or rolling over in bed.  A migraine headache.  Decreased blood flow to the brain.  Increased pressure in the brain from a head injury, infection, tumor, or bleeding. SYMPTOMS  You may feel as though the world is spinning around or you are falling to the ground. Because your balance is upset, vertigo can cause nausea and vomiting. You may have involuntary eye movements (nystagmus). DIAGNOSIS  Vertigo is usually diagnosed by physical exam. If the cause of your vertigo is unknown, your caregiver may perform imaging tests, such as an MRI scan (magnetic resonance imaging). TREATMENT  Most cases of vertigo resolve on their own, without treatment. Depending on the cause, your caregiver may prescribe certain medicines. If your vertigo is related to body position issues, your caregiver may recommend movements or procedures to correct the problem. In rare cases, if your vertigo is caused by certain inner ear problems, you may need surgery. HOME CARE INSTRUCTIONS   Follow your caregiver's instructions.  Avoid driving.  Avoid operating heavy machinery.  Avoid performing any tasks that would be dangerous to you or others during a vertigo episode.  Tell your caregiver if you notice that certain medicines seem to be causing your vertigo. Some of  the medicines used to treat vertigo episodes can actually make them worse in some people. SEEK IMMEDIATE MEDICAL CARE IF:   Your medicines do not relieve your vertigo or are making it worse.  You develop problems with talking, walking, weakness, or using your arms, hands, or legs.  You develop severe headaches.  Your nausea or vomiting continues or gets worse.  You develop visual changes.  A family member notices behavioral changes.  Your condition gets worse. MAKE SURE YOU:  Understand these instructions.  Will watch your condition.  Will get help right away if you are not doing well or get worse.   This information is not intended to replace advice given to you by your health care provider. Make sure you discuss any questions you have with your health care provider.   Document Released: 04/07/2005 Document Revised: 09/20/2011 Document Reviewed: 10/21/2014 Elsevier Interactive Patient Education [employer] Inc.

## 2015-09-25 ENCOUNTER — Telehealth: Payer: Self-pay | Admitting: General Practice

## 2015-09-25 NOTE — Telephone Encounter (Signed)
Patient stated that she was seen and treated for vertigo.  Patient was written out of work for two days.  Patient stated that she missed an additional day of work due to dizziness and would like a doctors note to be written out. Please follow up.

## 2015-09-25 NOTE — Telephone Encounter (Signed)
CMA called patient, patient verified name and DOB. Patient was seen in office on Monday and was given a note to excuse her absence from work on Tuesday and Wednesday. Patient didn't make it to work today, Thursday and asking for a note for today. I informed patient that i would have to get approval through Provider before I can write an additional lettter. She verbalized she understood with no further questions.

## 2015-09-25 NOTE — Telephone Encounter (Signed)
Patient stated that she was seen and treated for vertigo.  °Patient was written out of work for two days.  °Patient stated that she missed an additional day of work due to dizziness and would like a doctors note to be written out. °Please follow up. ° °

## 2015-09-30 ENCOUNTER — Telehealth: Payer: Self-pay | Admitting: Internal Medicine

## 2015-09-30 NOTE — Telephone Encounter (Signed)
CMA called patient, patient verified name and DOB. Per Dr. Julien NordmannLangeland, patient will get a note to excuse her absence for Thursday. Patient states she did go to work on that Friday the week of [facility]. Letter will be faxed to patient's employer.

## 2015-09-30 NOTE — Telephone Encounter (Signed)
Pt. Called to speak to the nurse regarding the a letter she needs for her work. Pt. Spoke with the nurse on 09/25/15. Please f/u

## 2015-10-07 ENCOUNTER — Ambulatory Visit: Payer: Self-pay | Admitting: Internal Medicine

## 2015-10-15 ENCOUNTER — Other Ambulatory Visit: Payer: Self-pay | Admitting: Internal Medicine

## 2015-10-15 ENCOUNTER — Telehealth: Payer: Self-pay | Admitting: Internal Medicine

## 2015-10-15 DIAGNOSIS — F319 Bipolar disorder, unspecified: Secondary | ICD-10-CM

## 2015-10-15 MED ORDER — CARBAMAZEPINE ER 400 MG PO TB12: 400.0000 mg | ORAL_TABLET | Freq: Every day | ORAL | Status: DC

## 2015-10-15 MED ORDER — CITALOPRAM HYDROBROMIDE 40 MG PO TABS: 40.0000 mg | ORAL_TABLET | Freq: Every day | ORAL | Status: DC

## 2015-10-15 MED FILL — CARBAMAZEPINE ER 400 MG TAB: 400 | 30 days supply | Qty: 30 | Fill #0

## 2015-10-15 NOTE — Telephone Encounter (Signed)
Pt. Came in today requesting for her PCP to fill out [employer] paperwork. The paperwork will be put in the providers box. Pt. Is also requesting med refill on the following medications:  citalopram (CELEXA) 40 MG tablet carbamazepine (TEGRETOL XR) 400 MG 12 hr tablet  Please f/u

## 2015-10-16 MED FILL — CITALOPRAM HBR 40 MG TABLET: 40 | 30 days supply | Qty: 30 | Fill #0

## 2015-11-25 MED FILL — CITALOPRAM HBR 40 MG TABLET: 40 | 30 days supply | Qty: 30 | Fill #1

## 2015-12-25 ENCOUNTER — Other Ambulatory Visit: Payer: Self-pay | Admitting: Internal Medicine

## 2015-12-25 MED FILL — CARBAMAZEPINE ER 400 MG TAB: 400 | 30 days supply | Qty: 30 | Fill #1

## 2015-12-25 MED FILL — CITALOPRAM HBR 40 MG TABLET: 40 | 30 days supply | Qty: 30 | Fill #0

## 2016-01-08 ENCOUNTER — Telehealth: Payer: Self-pay | Admitting: Internal Medicine

## 2016-01-08 ENCOUNTER — Ambulatory Visit: Payer: 59 | Attending: Internal Medicine | Admitting: Physician Assistant

## 2016-01-08 VITALS — BP 132/86 | HR 85 | Temp 98.8°F | Resp 16 | Ht 62.0 in | Wt 144.0 lb

## 2016-01-08 DIAGNOSIS — F4321 Adjustment disorder with depressed mood: Secondary | ICD-10-CM

## 2016-01-08 NOTE — Progress Notes (Signed)
Patient ID: Yvonne [name], female   DOB: 1965-07-28, 50 y.o.   MRN: [id number]   Yvonne [name], is a 50 y.o. female  [id number]  [id number]  DOB - 1965-07-28  Chief Complaint  Patient presents with  . Grief    Father died last night from cancer, needs short term disability.        Subjective:  Chief Complaint and HPI: Yvonne [name] is a 50 y.o. female here today as a work in appointment after her father died yesterday from cancer but much sooner than expected. She called for an appointment this morning and was worked in because she said she "just needed someone to talk to."  She also asked about having short-term disability forms filled out and was told we would not be able to do that at this appointment.  When she arrived she had the disability forms and says she needs to be out of work and that is the reason she is here.  She is already scheduled to be off of work the next 6 days.  She says she can't see her psychiatrist because she owes his office money.  She can't get a regular(non work-in) appointment with Dr. Julien NordmannLangeland until 01/19/2016.  She was worked in today as an add-on.  Her son died about 2 years ago.  Her dad was diagnosed with cancer recently and died last night.  They thought they had more time.  She says she doesn't know how long she will need to be out of work. She says there is no way she will be able to work again anytime soon.  She declined help with refills, help with sleep, or counseling resources.  She says her dad was affiliated with Hospice and she may do some counseling there in time. She denies SI/HI.  She seems upset that we are unable to assist with disability papers today despite being told we could not help with that when she scheduled her appt today.  This is the first time I am meeting the patient.     ROS:   Constitutional:  No f/c, No night sweats, No unexplained weight loss. EENT:  No vision changes, No blurry vision, No hearing changes. No mouth,  throat, or ear problems.  Respiratory: No cough, No SOB Cardiac: No CP, no palpitations GI:  No abd pain, No N/V/D. GU: No Urinary s/sx Musculoskeletal: No joint pain Neuro: No headache, no dizziness, no motor weakness.  Skin: No rash Endocrine:  No polydipsia. No polyuria.  Psych: Denies SI/HI  No problems updated.  ALLERGIES: No Known Allergies  PAST MEDICAL HISTORY: Past Medical History  Diagnosis Date  . No pertinent past medical history   . Medical history non-contributory   . Mental disorder   . Bipolar 1 disorder (HCC)     MEDICATIONS AT HOME: Prior to Admission medications   Medication Sig Start Date End Date Taking? Authorizing Provider  carbamazepine (TEGRETOL XR) 400 MG 12 hr tablet Take 1 tablet (400 mg total) by mouth daily. 10/15/15  Yes Pete Glatterawn T Langeland, MD  citalopram (CELEXA) 40 MG tablet Take 1 tablet (40 mg total) by mouth daily. Needs office visit for refills 12/25/15  Yes Pete Glatterawn T Langeland, MD     Objective:  EXAM:   Filed Vitals:   01/08/16 1531  BP: 132/86  Pulse: 85  Temp: 98.8 F (37.1 C)  TempSrc: Oral  Resp: 16  Height: 5\' 2"  (1.575 m)  Weight: 144 lb (65.318 kg)  SpO2: 97%   No  exam  Data Review Lab Results  Component Value Date   HGBA1C 5.5 06/21/2012     Assessment & Plan  Grief reaction I offered to write her out of work for the next week, but she declined when I was unable to fill out her disability forms today.  Keep appt with Dr. Julien NordmannLangeland.       Patient have been counseled.   The patient was given clear instructions to go to ER or return to medical center if symptoms don't improve, worsen or new problems develop. The patient verbalized understanding. The patient was told to call to get lab results if they haven't heard anything in the next week.     Georgian CoAngela Lisa Milian, PA-C [facility] [place], [place] [phone]   01/08/2016, 4:15 PM

## 2016-01-08 NOTE — Telephone Encounter (Signed)
Pt. Called requesting an appointment with her PCP.  Pt. Was told that she has an appointment with her  PCP on 01/19/16 and she stated that she had to be  Seen before that date b/c her father had past away last  Night and she had lost her only son back in 2015. Pt. Wants to  Get paperwork filled out for her job b/c she is unable to work.  Spoke with the walk-in provider and was able to fit her  In today. Told pt. She could come in and talk to the provider but would have to wait until her appointment with her PCP to have that  Paperwork filled out. Pt. Understood and said she would come to the appointment.

## 2016-01-19 ENCOUNTER — Ambulatory Visit: Payer: Self-pay | Admitting: Internal Medicine

## 2016-02-02 ENCOUNTER — Other Ambulatory Visit: Payer: Self-pay | Admitting: Internal Medicine

## 2016-02-02 NOTE — Telephone Encounter (Signed)
Rx request 

## 2016-02-11 MED FILL — ?CITALOPRAM HBR 40 MG TABLE: 40 | 30 days supply | Qty: 30 | Fill #0

## 2016-02-11 MED FILL — CARBAMAZEPINE ER 400 MG TAB: 400 | 30 days supply | Qty: 30 | Fill #2

## 2016-03-16 MED FILL — ?CITALOPRAM HBR 40 MG TABLE: 40 | 30 days supply | Qty: 30 | Fill #0

## 2016-04-23 MED FILL — ?CITALOPRAM HBR 40 MG TABLE: 40 | 30 days supply | Qty: 30 | Fill #1

## 2016-05-27 MED FILL — ?CITALOPRAM HBR 40 MG TAB: 40 MG | 30 days supply | Qty: 30 | Fill #0

## 2016-06-29 MED FILL — CITALOPRAM HBR 40 MG TABLET: 40 | 30 days supply | Qty: 30 | Fill #1

## 2016-07-19 MED FILL — ?CARBAMAZEPINE XR 400 MG TA: 400 MG | 30 days supply | Qty: 30 | Fill #3

## 2016-07-31 MED FILL — CITALOPRAM HBR 40 MG TABLET: 40 | 30 days supply | Qty: 30 | Fill #1

## 2016-09-06 MED FILL — CITALOPRAM HBR 40 MG TABLET: 40 | 30 days supply | Qty: 30 | Fill #2

## 2016-09-28 ENCOUNTER — Other Ambulatory Visit: Payer: Self-pay | Admitting: Internal Medicine

## 2016-10-05 ENCOUNTER — Telehealth: Payer: Self-pay | Admitting: Internal Medicine

## 2016-10-05 NOTE — Telephone Encounter (Signed)
Pt called requesting a refill of her carbamazepine (TEGRETOL XR) 400 MG 12 hr tablet Would like script to be sent to our pharmacy here. Asks that someone please call to let her know if it is going to be filled and when it may be available. Thank you.

## 2016-10-05 NOTE — Telephone Encounter (Signed)
Dr. Julien NordmannLangeland denied refills - she needs to follow up with her neurologist for refills since she has not been seen here in over a year. Please notify patient.

## 2016-10-07 ENCOUNTER — Other Ambulatory Visit: Payer: Self-pay | Admitting: Internal Medicine

## 2016-11-24 ENCOUNTER — Emergency Department (HOSPITAL_COMMUNITY)
Admission: EM | Admit: 2016-11-24 | Discharge: 2016-11-25 | Disposition: A | Discharge status: Home / Self Care | Payer: 59 | Attending: Emergency Medicine | Admitting: Emergency Medicine

## 2016-11-24 DIAGNOSIS — R4182 Altered mental status, unspecified: Secondary | ICD-10-CM | POA: Diagnosis present

## 2016-11-24 DIAGNOSIS — R4689 Other symptoms and signs involving appearance and behavior: Secondary | ICD-10-CM

## 2016-11-24 DIAGNOSIS — F29 Unspecified psychosis not due to a substance or known physiological condition: Secondary | ICD-10-CM | POA: Diagnosis not present

## 2016-11-24 DIAGNOSIS — F918 Other conduct disorders: Secondary | ICD-10-CM | POA: Insufficient documentation

## 2016-11-24 LAB — RAPID URINE DRUG SCREEN, HOSP PERFORMED
Amphetamines: NOT DETECTED
Barbiturates: NOT DETECTED
Benzodiazepines: NOT DETECTED
Cocaine: NOT DETECTED
Opiates: NOT DETECTED
Tetrahydrocannabinol: NOT DETECTED

## 2016-11-24 LAB — CBC WITH DIFFERENTIAL/PLATELET
Basophils Absolute: 0 10*3/uL (ref 0.0–0.1)
Basophils Relative: 0 %
Eosinophils Absolute: 0 10*3/uL (ref 0.0–0.7)
Eosinophils Relative: 0 %
HCT: 34.5 % — ABNORMAL LOW (ref 36.0–46.0)
Hemoglobin: 11.7 g/dL — ABNORMAL LOW (ref 12.0–15.0)
Lymphocytes Relative: 12 %
Lymphs Abs: 0.8 10*3/uL (ref 0.7–4.0)
MCH: 27.9 pg (ref 26.0–34.0)
MCHC: 33.9 g/dL (ref 30.0–36.0)
MCV: 82.3 fL (ref 78.0–100.0)
Monocytes Absolute: 0.3 10*3/uL (ref 0.1–1.0)
Monocytes Relative: 4 %
Neutro Abs: 5.2 10*3/uL (ref 1.7–7.7)
Neutrophils Relative %: 84 %
Platelets: 212 10*3/uL (ref 150–400)
RBC: 4.19 MIL/uL (ref 3.87–5.11)
RDW: 13.3 % (ref 11.5–15.5)
WBC: 6.3 10*3/uL (ref 4.0–10.5)

## 2016-11-24 LAB — COMPREHENSIVE METABOLIC PANEL
ALT: 43 U/L (ref 14–54)
AST: 55 U/L — ABNORMAL HIGH (ref 15–41)
Albumin: 3.5 g/dL (ref 3.5–5.0)
Alkaline Phosphatase: 48 U/L (ref 38–126)
Anion gap: 17 — ABNORMAL HIGH (ref 5–15)
BUN: 13 mg/dL (ref 6–20)
CO2: 20 mmol/L — ABNORMAL LOW (ref 22–32)
Calcium: 9.3 mg/dL (ref 8.9–10.3)
Chloride: 101 mmol/L (ref 101–111)
Creatinine, Ser: 1 mg/dL (ref 0.44–1.00)
GFR calc Af Amer: >60 mL/min (ref >60)
GFR calc non Af Amer: >60 mL/min (ref >60)
Glucose, Bld: 66 mg/dL (ref 65–99)
Potassium: 3.8 mmol/L (ref 3.5–5.1)
Sodium: 138 mmol/L (ref 135–145)
Total Bilirubin: 1.6 mg/dL — ABNORMAL HIGH (ref 0.3–1.2)
Total Protein: 7.7 g/dL (ref 6.5–8.1)

## 2016-11-24 LAB — ETHANOL: Alcohol, Ethyl (B): <5 mg/dL (ref <5)

## 2016-11-24 MED ORDER — SODIUM CHLORIDE 0.9 % IV BOLUS (SEPSIS)
1000.0000 mL | Freq: Once | INTRAVENOUS | Status: AC
  Administered 2016-11-24: 1000 mL via INTRAVENOUS

## 2016-11-24 MED ORDER — STERILE WATER FOR INJECTION IJ SOLN
INTRAMUSCULAR | Status: AC
  Filled 2016-11-24: qty 10

## 2016-11-24 MED ORDER — STERILE WATER FOR INJECTION IJ SOLN
INTRAMUSCULAR | Status: AC
  Administered 2016-11-24: 10 mL
  Filled 2016-11-24: qty 10

## 2016-11-24 MED ORDER — ZIPRASIDONE MESYLATE 20 MG IM SOLR
10.0000 mg | Freq: Once | INTRAMUSCULAR | Status: AC
  Administered 2016-11-24: 10 mg via INTRAMUSCULAR
  Filled 2016-11-24: qty 20

## 2016-11-24 MED ORDER — LORAZEPAM 2 MG/ML IJ SOLN
1.0000 mg | Freq: Once | INTRAMUSCULAR | Status: AC
  Administered 2016-11-24: 1 mg via INTRAVENOUS
  Filled 2016-11-24: qty 1

## 2016-11-24 NOTE — ED Notes (Signed)
Pt continues to yell and cuss

## 2016-11-24 NOTE — ED Triage Notes (Signed)
Pt in under police custody, screaming & yelling, will not allow health care workers to touch the patient, pt states, "I will kill you bitch." officers at bedside, pt had thoughts of grandeur, per  Notes from facility is that the pt has not taking bipolar medications and has been on a hunger strike, pt excessive speech, Charge RN aware of need for room for sedation

## 2016-11-24 NOTE — ED Notes (Signed)
Pt spitting in floor, pt yelling and threatening officers and staff

## 2016-11-24 NOTE — ED Notes (Signed)
Officer holding pt down for this RN to give injections. Pt shouting cuss words, speaking gibberish saying she is cursing us. Pt very uncooperative and agitated.

## 2016-11-24 NOTE — ED Notes (Addendum)
Pt allowed RN to administer IM medications, but continued to be verbally aggressive throughout. Pt continues to aggressively refuse vitals, IV access, or blood draw. At one point, screaming "GET THE FUCK AWAY FROM ME!"   Pt remains in prison shackles, fully alert with unlabored respirations and good peripheral pulses.

## 2016-11-24 NOTE — ED Provider Notes (Signed)
Receive this patient in hand off.  She's been at the jail for several weeks and 5 days ago, stopped eating.  They sent her to the emergency part for altered mental status.  She was sedated by previous provider because she was uncooperative and psychotic. She now has an IV running with fluids.  Labs have been obtained.  Physical exam reveals normal.  HEENT heart sounds normal without tachycardia.  Lung sounds clear to auscultation.  Abdomen soft, nontender living all extremities.  No obvious sign of any injury on skin surfaces. Patient has been medically cleared.  No reason for medical admission to the hospital jail, was contacted and they can handle.  Psychiatric issues.  She'll be discharged in custody of Sheriff's back to jail.   Earley FavorSchulz, Kalel Harty, NP 11/25/16 [id number]    Marily MemosMesner, Jason, MD 11/27/16 [phone]

## 2016-11-24 NOTE — ED Notes (Signed)
Pt more relaxed now, but COs keeping shackles on.

## 2016-11-24 NOTE — ED Notes (Addendum)
Pt refusing vitals, refusing blood draw. Pt remains in prison shackles with COs at bedside. Pt remains A&O with unlabored breathing.

## 2016-11-24 NOTE — ED Notes (Signed)
Pt resting at this time with unlabored breathing. Remains in prison shackles with COs present.

## 2016-11-24 NOTE — ED Notes (Addendum)
Pt remains in wrist and ankle shackles as she was brought in with by [employer] from [facility]. Pt screaming from room, verbally aggressive and unwilling for staff to gather vitals. Pt CAOx4 with a patent airway, adequate respirations, unlabored breathing, and good color. Distal pulses present.

## 2016-11-24 NOTE — ED Provider Notes (Signed)
[facility]-EMERGENCY DEPT Provider Note   CSN: [id number] Arrival date & time: 11/24/16  1752     History   Chief Complaint Chief Complaint  Patient presents with  . Altered Mental Status  . Aggressive Behavior    HPI Yvonne [name] is a 51 y.o. female.  HPI   Level V caveat due to altered mental status  Patient presents from jail today with altered mental status. Per staff patient has been refusing medications since 424, and also has been refusing food since 5/13 with no caloric intake. Per staff patient is aggressive, causing, screaming and rambling on.  On my evaluation patient is in handcuffs on the bed she is screaming and yelling passing and speaking in tongues. She asked me to "call Garnet Koyanagi and have him come here", causing a nursing staff kicking at nursing staff. Unable to give vital signs on patient due to her hostile nature. Patient does appear to have dry mucous membranes.    Past Medical History:  Diagnosis Date  . Bipolar 1 disorder (HCC)   . Medical history non-contributory   . Mental disorder   . No pertinent past medical history     Patient Active Problem List   Diagnosis Date Noted  . Facial cellulitis 02/10/2014  . Bipolar I disorder, most recent episode (or current) depressed, severe, without mention of psychotic behavior 04/25/2013  . Bipolar disorder, unspecified (HCC) 01/02/2013  . MDD (major depressive disorder) 08/02/2012    Past Surgical History:  Procedure Laterality Date  . NO PAST SURGERIES      OB History    No data available       Home Medications    Prior to Admission medications   Medication Sig Start Date End Date Taking? Authorizing Provider  carbamazepine (TEGRETOL XR) 400 MG 12 hr tablet Take 1 tablet (400 mg total) by mouth daily. 10/15/15   Pete Glatter, MD  citalopram (CELEXA) 40 MG tablet Take 1 tablet (40 mg total) by mouth daily. 02/02/16   Pete Glatter, MD    Family History Family History  Problem  Relation Age of Onset  . Depression Sister   . Depression Brother   . Bipolar disorder Cousin   . Depression Sister   . CAD Mother   . Hypertension Father   . Diabetes Father     Social History Social History  Substance Use Topics  . Smoking status: Never Smoker  . Smokeless tobacco: Never Used  . Alcohol use No     Allergies   Patient has no known allergies.   Review of Systems Review of Systems  All other systems reviewed and are negative.    Physical Exam Updated Vital Signs There were no vitals taken for this visit.  Unable to safely obtain vital signs   Physical Exam  Constitutional:  Malnourished  HENT:  Head: Normocephalic and atraumatic.  Dry mucous membranes  Eyes: Conjunctivae are normal. Pupils are equal, round, and reactive to light. Right eye exhibits no discharge. Left eye exhibits no discharge. No scleral icterus.  Neck: Normal range of motion. No JVD present. No tracheal deviation present.  Pulmonary/Chest: Effort normal. No stridor.  Neurological: She is alert. Coordination normal.  Patient rambling on, often speaking in tongues  Skin: Skin is warm.  Psychiatric: She has a normal mood and affect. Her behavior is normal. Judgment and thought content normal.  Nursing note and vitals reviewed.    ED Treatments / Results  Labs (all labs ordered are listed,  but only abnormal results are displayed) Labs Reviewed  COMPREHENSIVE METABOLIC PANEL  ETHANOL  CBC WITH DIFFERENTIAL/PLATELET  RAPID URINE DRUG SCREEN, HOSP PERFORMED  URINALYSIS, ROUTINE W REFLEX MICROSCOPIC    EKG  EKG Interpretation None       Radiology No results found.   Procedures Procedures (including critical care time)  Medications Ordered in ED Medications  sterile water (preservative free) injection (not administered)  ziprasidone (GEODON) injection 10 mg (not administered)  LORazepam (ATIVAN) injection 1 mg (not administered)  LORazepam (ATIVAN) injection 1  mg (1 mg Intravenous Given 11/24/16 1849)  ziprasidone (GEODON) injection 10 mg (10 mg Intramuscular Given 11/24/16 1849)     Initial Impression / Assessment and Plan / ED Course  I have reviewed the triage vital signs and the nursing notes.  Pertinent labs & imaging results that were available during my care of the patient were reviewed by me and considered in my medical decision making (see chart for details).     Final Clinical Impressions(s) / ED Diagnoses   Final diagnoses:  Psychosis, unspecified psychosis type    Labs: Ativan, Geodon  Imaging:  Consults:  Therapeutics: geodon 10mg / Ativan 1 mg x 2   Discharge Meds:   Assessment/Plan: 51 year old female presents today with psychosis. Patient has behavior health history, noncompliant with medication, now will not eat or drink. She has dry mucous membranes I personally tried to give her water she refused this. Patient was given Ativan and Geodon here in the ED which calmed her down slightly, but still unable to remove handcuffs due to her erratic behavior.  Pt is still refusing labs and will not take PO. She is unsafe to have restraints removed at this time. Im concerned that patient could potentially be causing sig harm to herself due to her psychosis and refusal to eat and drink. She is not in a clear state of mind that she can make informed decisions about her health.   Pt will be given another dose of geodon and ativan in an attempt to facilitate ongoing care.  Pt care will be handed off to oncoming provider pending ongoing work-up and disposition.   New Prescriptions New Prescriptions   No medications on file     Rosalio LoudHedges, Bladimir Auman, PA-C 11/24/16 2004    Mesner, Barbara CowerJason, MD 11/27/16 [phone]

## 2016-11-25 LAB — URINALYSIS, ROUTINE W REFLEX MICROSCOPIC
Bilirubin Urine: NEGATIVE
Glucose, UA: NEGATIVE mg/dL
Hgb urine dipstick: NEGATIVE
Ketones, ur: 20 mg/dL — AB
Leukocytes, UA: NEGATIVE
Nitrite: NEGATIVE
Protein, ur: NEGATIVE mg/dL
Specific Gravity, Urine: 1.012 (ref 1.005–1.030)
pH: 5 (ref 5.0–8.0)

## 2016-11-25 MED ORDER — ONDANSETRON HCL 4 MG PO TABS: 4.0000 mg | ORAL_TABLET | Freq: Three times a day (TID) | ORAL | Status: DC | PRN

## 2016-11-25 MED ORDER — ACETAMINOPHEN 325 MG PO TABS: 650.0000 mg | ORAL_TABLET | ORAL | Status: DC | PRN

## 2016-11-25 MED ORDER — CARBAMAZEPINE ER 400 MG PO TB12: 400.0000 mg | ORAL_TABLET | Freq: Every day | ORAL | Status: DC

## 2016-11-25 NOTE — Discharge Instructions (Signed)
Follow up with psychiatrist at facility

## 2016-11-29 ENCOUNTER — Emergency Department (HOSPITAL_COMMUNITY)
Admission: EM | Admit: 2016-11-29 | Discharge: 2016-12-01 | Disposition: A | Discharge status: Other Acute Inpatient Hospital | Payer: 59 | Attending: Emergency Medicine | Admitting: Emergency Medicine

## 2016-11-29 ENCOUNTER — Encounter (HOSPITAL_COMMUNITY): Payer: Self-pay

## 2016-11-29 DIAGNOSIS — F29 Unspecified psychosis not due to a substance or known physiological condition: Secondary | ICD-10-CM | POA: Insufficient documentation

## 2016-11-29 DIAGNOSIS — F23 Brief psychotic disorder: Secondary | ICD-10-CM

## 2016-11-29 DIAGNOSIS — R4182 Altered mental status, unspecified: Secondary | ICD-10-CM | POA: Diagnosis present

## 2016-11-29 LAB — CBC WITH DIFFERENTIAL/PLATELET
Basophils Absolute: 0 10*3/uL (ref 0.0–0.1)
Basophils Relative: 0 %
EOS PCT: 0 %
Eosinophils Absolute: 0 10*3/uL (ref 0.0–0.7)
HCT: 32.7 % — ABNORMAL LOW (ref 36.0–46.0)
Hemoglobin: 10.9 g/dL — ABNORMAL LOW (ref 12.0–15.0)
Lymphocytes Relative: 34 %
Lymphs Abs: 0.9 10*3/uL (ref 0.7–4.0)
MCH: 27.2 pg (ref 26.0–34.0)
MCHC: 33.3 g/dL (ref 30.0–36.0)
MCV: 81.5 fL (ref 78.0–100.0)
MONO ABS: 0.3 10*3/uL (ref 0.1–1.0)
Monocytes Relative: 12 %
Neutro Abs: 1.4 10*3/uL — ABNORMAL LOW (ref 1.7–7.7)
Neutrophils Relative %: 54 %
Platelets: 210 10*3/uL (ref 150–400)
RBC: 4.01 MIL/uL (ref 3.87–5.11)
RDW: 13 % (ref 11.5–15.5)
WBC: 2.6 10*3/uL — ABNORMAL LOW (ref 4.0–10.5)

## 2016-11-29 LAB — COMPREHENSIVE METABOLIC PANEL
ALT: 44 U/L (ref 14–54)
AST: 55 U/L — ABNORMAL HIGH (ref 15–41)
Albumin: 3.5 g/dL (ref 3.5–5.0)
Alkaline Phosphatase: 48 U/L (ref 38–126)
Anion gap: 15 (ref 5–15)
BUN: 9 mg/dL (ref 6–20)
CHLORIDE: 101 mmol/L (ref 101–111)
CO2: 22 mmol/L (ref 22–32)
CREATININE: 0.78 mg/dL (ref 0.44–1.00)
Calcium: 9 mg/dL (ref 8.9–10.3)
Glucose, Bld: 76 mg/dL (ref 65–99)
POTASSIUM: 3 mmol/L — AB (ref 3.5–5.1)
Sodium: 138 mmol/L (ref 135–145)
Total Bilirubin: 1.5 mg/dL — ABNORMAL HIGH (ref 0.3–1.2)
Total Protein: 7.2 g/dL (ref 6.5–8.1)

## 2016-11-29 MED ORDER — LORAZEPAM 1 MG PO TABS
1.0000 mg | ORAL_TABLET | Freq: Three times a day (TID) | ORAL | Status: DC | PRN
  Administered 2016-11-30: 1 mg via ORAL
  Filled 2016-11-29: qty 1

## 2016-11-29 MED ORDER — POTASSIUM CHLORIDE CRYS ER 20 MEQ PO TBCR
60.0000 meq | EXTENDED_RELEASE_TABLET | Freq: Once | ORAL | Status: DC
  Filled 2016-11-29: qty 3

## 2016-11-29 MED ORDER — STERILE WATER FOR INJECTION IJ SOLN
INTRAMUSCULAR | Status: AC
  Filled 2016-11-29: qty 10

## 2016-11-29 MED ORDER — ZOLPIDEM TARTRATE 5 MG PO TABS: 5.0000 mg | ORAL_TABLET | Freq: Every evening | ORAL | Status: DC | PRN

## 2016-11-29 MED ORDER — ZIPRASIDONE MESYLATE 20 MG IM SOLR
20.0000 mg | Freq: Once | INTRAMUSCULAR | Status: AC
  Administered 2016-11-29: 20 mg via INTRAMUSCULAR
  Filled 2016-11-29: qty 20

## 2016-11-29 MED ORDER — POTASSIUM CHLORIDE 10 MEQ/100ML IV SOLN
10.0000 meq | INTRAVENOUS | Status: AC
  Administered 2016-11-29: 10 meq via INTRAVENOUS
  Filled 2016-11-29: qty 100

## 2016-11-29 MED ORDER — ACETAMINOPHEN 325 MG PO TABS
650.0000 mg | ORAL_TABLET | Freq: Once | ORAL | Status: AC
  Administered 2016-11-29: 650 mg via ORAL
  Filled 2016-11-29: qty 2

## 2016-11-29 MED ORDER — STERILE WATER FOR INJECTION IJ SOLN
INTRAMUSCULAR | Status: AC
  Administered 2016-11-29: 1.5 mL
  Filled 2016-11-29: qty 10

## 2016-11-29 MED ORDER — MAGNESIUM OXIDE 400 (241.3 MG) MG PO TABS
800.0000 mg | ORAL_TABLET | Freq: Once | ORAL | Status: DC
  Filled 2016-11-29: qty 2

## 2016-11-29 MED ORDER — ONDANSETRON HCL 4 MG PO TABS: 4.0000 mg | ORAL_TABLET | Freq: Three times a day (TID) | ORAL | Status: DC | PRN

## 2016-11-29 MED ORDER — IBUPROFEN 400 MG PO TABS: 600.0000 mg | ORAL_TABLET | Freq: Three times a day (TID) | ORAL | Status: DC | PRN

## 2016-11-29 MED ORDER — MAGNESIUM SULFATE 2 GM/50ML IV SOLN
2.0000 g | Freq: Once | INTRAVENOUS | Status: AC
  Administered 2016-11-29: 2 g via INTRAVENOUS
  Filled 2016-11-29: qty 50

## 2016-11-29 NOTE — ED Triage Notes (Signed)
[facility] because pt has not eaten in 7 days and refused medication. Pt speaks in pressured speech and screaming. [facility]

## 2016-11-29 NOTE — BH Assessment (Signed)
Tele Assessment Note   Yvonne [name] is an 51 y.o. female, who presents to [facility] ED per ED report: Pt arrives with handcuffs in place from [facility] because pt has not eaten in 7 days and refused medication. Pt speaks in pressured speech and screaming. IVC paperwork with pt.Pt from [place]. Patient non-cooperative and refused participate in assessment. Case discussed with Donell Sievert, NP states recommend a.m. Psych eval  Diagnosis: Bipolar 1 Disorder, Current Episode Manic  Past Medical History:  Past Medical History:  Diagnosis Date  . Bipolar 1 disorder (HCC)   . Medical history non-contributory   . Mental disorder   . No pertinent past medical history     Past Surgical History:  Procedure Laterality Date  . NO PAST SURGERIES      Family History:  Family History  Problem Relation Age of Onset  . Depression Sister   . Depression Brother   . Depression Sister   . CAD Mother   . Hypertension Father   . Diabetes Father   . Bipolar disorder Cousin     Social History:  reports that she has never smoked. She has never used smokeless tobacco. She reports that she does not drink alcohol or use drugs.  Additional Social History:  Alcohol / Drug Use Pain Medications: SEE MAR Prescriptions: SEE MAR Over the Counter: SEE MAR History of alcohol / drug use?:  (UTA) Longest period of sobriety (when/how long): UTA  CIWA: CIWA-Ar BP: (!) 142/125 Pulse Rate: (!) 116 COWS:    PATIENT STRENGTHS: (choose at least two) Wellsite geologist fund of knowledge  Allergies: No Known Allergies  Home Medications:  (Not in a hospital admission)  OB/GYN Status:  No LMP recorded. Patient is not currently having periods (Reason: Perimenopausal).  General Assessment Data Location of Assessment: [facility] ED TTS Assessment: In system Is this a Tele or Face-to-Face Assessment?: Tele Assessment Is this an Initial Assessment or a Re-assessment for this encounter?: Initial  Assessment Juanell Fairly name: UTA Can pt return to current living arrangement?:  (UTA) Admission Status: Involuntary Is patient capable of signing voluntary admission?:  (UTA) Referral Source: Other Insurance type: [employer]     Crisis Care Plan Legal Guardian: Other: Industrial/product designer) Name of Psychiatrist: UTA Name of Therapist: UTA  Education Status Current Grade: N/A Highest grade of school patient has completed: UTA Name of school: UTA Contact person: UTA  Risk to self with the past 6 months Suicidal Ideation:  (UTA) Has patient been a risk to self within the past 6 months prior to admission? :  (UTA) Suicidal Intent:  (UTA) Has patient had any suicidal intent within the past 6 months prior to admission? :  (UTA) Is patient at risk for suicide?: Yes Suicidal Plan?:  (UTA) Has patient had any suicidal plan within the past 6 months prior to admission? :  (UTA) Access to Means:  (UTA) What has been your use of drugs/alcohol within the last 12 months?:  (UTA) Previous Attempts/Gestures:  (UTA) How many times?:  (UTA) Other Self Harm Risks:  (UTA) Triggers for Past Attempts:  (UTA) Intentional Self Injurious Behavior:  (UTA) Family Suicide History:  (UTA) Recent stressful life event(s): Other (Comment) Persecutory voices/beliefs?:  (UTA) Depression:  (UTA) Depression Symptoms:  (UTA) Substance abuse history and/or treatment for substance abuse?:  (UTA) Suicide prevention information given to non-admitted patients:  (UTA)  Risk to Others within the past 6 months Homicidal Ideation:  (UTA) Does patient have any lifetime risk of violence toward others  beyond the six months prior to admission? : Unknown Thoughts of Harm to Others:  (UTA) Current Homicidal Intent:  (UTA) Current Homicidal Plan:  (UTA) Access to Homicidal Means:  (UTA) Identified Victim:  (UTA) History of harm to others?:  (UTA) Assessment of Violence:  (UTA) Violent Behavior Description: ' (UTA) Does patient have access to  weapons?:  (UTA) Criminal Charges Pending?:  (UTA) Does patient have a court date:  (UTA) Is patient on probation?:  (UTA)  Psychosis Hallucinations:  (UTA) Delusions:  (UTA)  Mental Status Report Appearance/Hygiene:  (UTA) Eye Contact: Unable to Assess Motor Activity: Unable to assess Speech: Unable to assess Level of Consciousness: Unable to assess Mood: Angry Affect: Angry Anxiety Level:  (UTA) Thought Processes:  (UTA) Judgement:  (UTA) Orientation:  (UTA) Obsessive Compulsive Thoughts/Behaviors: Unable to Assess  Cognitive Functioning Concentration: Unable to Assess Memory: Unable to Assess IQ: Average Insight: Unable to Assess Impulse Control: Unable to Assess Appetite:  (UTA) Sleep:  (UTA) Total Hours of Sleep:  (UTA) Vegetative Symptoms:  (UTA)  ADLScreening [facility]) Patient's cognitive ability adequate to safely complete daily activities?: Yes Patient able to express need for assistance with ADLs?:  (UTA) Independently performs ADLs?:  (UTA)  Prior Inpatient Therapy Prior Inpatient Therapy: Yes Prior Therapy Dates: 2014 [facility] Reason for Treatment: Manic  Prior Outpatient Therapy Prior Outpatient Therapy:  (UTA) Prior Therapy Dates: N/A Prior Therapy Facilty/Provider(s): N/A Reason for Treatment: N/A Does patient have an ACCT team?:  (UTA) Does patient have Intensive In-House Services?  :  (UTA) Does patient have [facility] services? :  (UTA) Does patient have P4CC services?:  (UTA)  ADL Screening (condition at time of admission) Patient's cognitive ability adequate to safely complete daily activities?: Yes Is the patient deaf or have difficulty hearing?: (S)  (UTA) Does the patient have difficulty seeing, even when wearing glasses/contacts?:  (UTA) Does the patient have difficulty concentrating, remembering, or making decisions?:  (UTA) Patient able to express need for assistance with ADLs?:   (UTA) Does the patient have difficulty dressing or bathing?:  (UTA) Independently performs ADLs?:  (UTA) Does the patient have difficulty walking or climbing stairs?:  (UTA) Weakness of Arms/Hands:  (UTA)       Abuse/Neglect Assessment (Assessment to be complete while patient is alone) Physical Abuse:  (UTA) Verbal Abuse:  (UTA) Sexual Abuse:  (UTA) Exploitation of patient/patient's resources:  (UTA) Self-Neglect:  (UTA) Possible abuse reported to::  (UTA) Values / Beliefs Cultural Requests During Hospitalization:  (UTA) Spiritual Requests During Hospitalization:  (UTA)   Advance Directives (For Healthcare) Does Patient Have a Medical Advance Directive?:  (UTA)    Additional Information 1:1 In Past 12 Months?:  (UTA) CIRT Risk: Yes Elopement Risk: Yes Does patient have medical clearance?: No     Disposition:  Per Donell SievertSpencer Simon, NP recommend a.m. Psych evaluation  Disposition Initial Assessment Completed for this Encounter: Yes Disposition of Patient: Other dispositions (AM Psych Eval)  Hipolito BayleyShean K Rushton Early 11/29/2016 10:24 PM

## 2016-11-29 NOTE — ED Notes (Signed)
Pt expressing manic behaviors, yelling and threatening staff.  Pt attempted to lock self in the bathroom however was redirected to the bed w/ assistance of security.  Did agree to take "shot" with security in the room and did not struggle while RN administered medication.  Pt asked for food which was ordered.

## 2016-11-29 NOTE — ED Notes (Signed)
Pt, continues pressured speech and spitting at intervals.

## 2016-11-29 NOTE — ED Provider Notes (Signed)
[facility]-EMERGENCY DEPT Provider Note   CSN: [id number] Arrival date & time: 11/29/16  1751     History   Chief Complaint Chief Complaint  Patient presents with  . Altered Mental Status    HPI Yvonne [name] is a 51 y.o. female.  HPI Pt comes in with cc of psychoses and hostile behavior. Pt brought to the ER from jail after IVC. Per [employer], pt has not been eating or drinking well and has refused meds. She has hx of bipolar disorder, depression. Per IVC paperwork, pt is aggressive at the time of the discharge.  Pt denies nausea, emesis, fevers, chills, chest pains, shortness of breath, headaches - but she just said no to everything and is not cooperative. Pt is spitting and being hostile towards police and ED staff.  Past Medical History:  Diagnosis Date  . Bipolar 1 disorder (HCC)   . Medical history non-contributory   . Mental disorder   . No pertinent past medical history     Patient Active Problem List   Diagnosis Date Noted  . Facial cellulitis 02/10/2014  . Bipolar I disorder, most recent episode (or current) depressed, severe, without mention of psychotic behavior 04/25/2013  . Bipolar disorder, unspecified (HCC) 01/02/2013  . MDD (major depressive disorder) 08/02/2012    Past Surgical History:  Procedure Laterality Date  . NO PAST SURGERIES      OB History    No data available       Home Medications    Prior to Admission medications   Medication Sig Start Date End Date Taking? Authorizing Provider  carbamazepine (TEGRETOL XR) 400 MG 12 hr tablet Take 1 tablet (400 mg total) by mouth daily. 10/15/15   Pete Glatter, MD  citalopram (CELEXA) 40 MG tablet Take 1 tablet (40 mg total) by mouth daily. 02/02/16   Pete Glatter, MD    Family History Family History  Problem Relation Age of Onset  . Depression Sister   . Depression Brother   . Depression Sister   . CAD Mother   . Hypertension Father   . Diabetes Father   . Bipolar disorder Cousin      Social History Social History  Substance Use Topics  . Smoking status: Never Smoker  . Smokeless tobacco: Never Used  . Alcohol use No     Allergies   Patient has no known allergies.   Review of Systems Review of Systems  Unable to perform ROS: Psychiatric disorder     Physical Exam Updated Vital Signs BP (!) 142/125 (BP Location: Left Arm)   Pulse (!) 116   Temp 97.4 F (36.3 C) (Oral)   Resp (!) 24   Ht 5\' 3"  (1.6 m)   Wt 63.5 kg (140 lb)   SpO2 100%   BMI 24.80 kg/m   Physical Exam  Constitutional: She appears well-developed.  HENT:  Head: Normocephalic and atraumatic.  Eyes: EOM are normal.  Neck: Normal range of motion. Neck supple.  Cardiovascular: Normal rate.   Pulmonary/Chest: Effort normal.  Abdominal: Bowel sounds are normal.  Neurological: She is alert.  Skin: Skin is warm and dry.  Psychiatric:  Pressured speech, hostile behavior, aggressive behavior.  Nursing note and vitals reviewed.    ED Treatments / Results  Labs (all labs ordered are listed, but only abnormal results are displayed) Labs Reviewed  COMPREHENSIVE METABOLIC PANEL - Abnormal; Notable for the following:       Result Value   Potassium 3.0 (*)  AST 55 (*)    Total Bilirubin 1.5 (*)    All other components within normal limits  CBC WITH DIFFERENTIAL/PLATELET - Abnormal; Notable for the following:    WBC 2.6 (*)    Hemoglobin 10.9 (*)    HCT 32.7 (*)    All other components within normal limits  RAPID URINE DRUG SCREEN, HOSP PERFORMED    EKG  EKG Interpretation  Date/Time:  Monday Nov 29 2016 18:46:21 EDT Ventricular Rate:  100 PR Interval:    QRS Duration: 85 QT Interval:  347 QTC Calculation: 441 R Axis:   70 Text Interpretation:  Wandering atrial pacemaker Abnormal R-wave progression, early transition No acute changes No significant change since last tracing Confirmed by Derwood KaplanNanavati, Kasiah Manka [phone]([id number]) on 11/29/2016 6:55:00 PM       Radiology No results  found.  Procedures Procedures (including critical care time)  Medications Ordered in ED Medications  ziprasidone (GEODON) injection 20 mg (not administered)  sterile water (preservative free) injection (not administered)  potassium chloride 10 mEq in 100 mL IVPB (not administered)  magnesium sulfate IVPB 2 g 50 mL (not administered)  ziprasidone (GEODON) injection 20 mg (20 mg Intramuscular Given 11/29/16 1818)  sterile water (preservative free) injection (1.5 mLs  Given 11/29/16 1820)     Initial Impression / Assessment and Plan / ED Course  I have reviewed the triage vital signs and the nursing notes.  Pertinent labs & imaging results that were available during my care of the patient were reviewed by me and considered in my medical decision making (see chart for details).  Clinical Course as of Nov 30 1946  Mon Nov 29, 2016  1947 Responded well to initial geodon, but stating yelling and threatening again. Repeat dose ordered, which will take her to 40 mg dose for today.  [AN]  1948 Pt is medically cleared. Potassium and magnesium ordered.  [AN]    Clinical Course User Index [AN] Derwood KaplanNanavati, Shellee Streng, MD    Pt comes in with cc of aggressive behavior. She is decompensated from underlying psychiatric condition. While in jail it seems like she was not taking her meds. Pt is medically cleared for psych evaluation. Pt has mild hypoK, we will order iv K, but with functioning kidneys and normal EKG, the low K is inconsequential.    Final Clinical Impressions(s) / ED Diagnoses   Final diagnoses:  Acute psychosis    New Prescriptions New Prescriptions   No medications on file     Derwood KaplanNanavati, Jurnie Garritano, MD 11/29/16 1949

## 2016-11-29 NOTE — Progress Notes (Signed)
Patient assessment completed mostly with Caryl AspUTA [Patient was non-cooperative and refused], Case discussed with Donell SievertSpencer, Simon, PA recommend a.m. Psych evaluation Heidemarie Goodnow K. Sherlon HandingHarris, LCAS-A, LPC-A, [facility]  Counselor 11/29/2016 10:29 PM

## 2016-11-30 MED ORDER — ZIPRASIDONE MESYLATE 20 MG IM SOLR
20.0000 mg | Freq: Once | INTRAMUSCULAR | Status: AC
  Administered 2016-11-30: 20 mg via INTRAMUSCULAR
  Filled 2016-11-30: qty 20

## 2016-11-30 MED ORDER — LORAZEPAM 2 MG/ML IJ SOLN
2.0000 mg | Freq: Once | INTRAMUSCULAR | Status: AC
  Administered 2016-11-30: 2 mg via INTRAMUSCULAR
  Filled 2016-11-30: qty 1

## 2016-11-30 MED ORDER — STERILE WATER FOR INJECTION IJ SOLN
INTRAMUSCULAR | Status: AC
  Administered 2016-11-30: 1.2 mL
  Filled 2016-11-30: qty 10

## 2016-11-30 NOTE — ED Notes (Signed)
Pt remains out of bed and requested to remain in bed after medication. Returns to bed. Still speaking constantly but less flight of idea.

## 2016-11-30 NOTE — Progress Notes (Signed)
CSW spoke with RN who states the pt is not in jail custody. Pt was d/c from jail and became aggressive and the LEO brought the pt to the ED. Pt is not in jail custody. Pt to receive psych eval today.  Yvonne [name], MSW, LCSWA CSW Disposition [phone]

## 2016-11-30 NOTE — Progress Notes (Signed)
Pt meets criteria for inpt treatment. CSW faxed referrals to the following inpt facilities for review:  [facility], [facility], [address] [facility], [address], [facility], [facility], [facility], [facility], [facility], [facility], [place]  TTS will continue to seek bed placement.  Princess BruinsAquicha Shyrl Obi, LCSWA Clinical Social Work [phone]

## 2016-11-30 NOTE — ED Notes (Addendum)
[facility] and [facility] state  They will speak with pt soonest and ask to hold geodon if able but give if needed. Spoke with Misty StanleyLisa PA and she agrees with plan. Misty StanleyLisa also aware the UDS has not been performed and state this is ok due to pt agitation .

## 2016-11-30 NOTE — Progress Notes (Signed)
CSW spoke with [name] [facility] and pt has been accepted after 9am on 12/01/16. Call to report [phone] and the accepting is Dr. Estill Cottahomas Cornwall, MD. Pt will be going to the acute unit. RN notified.  [name], MSW, LCSWA CSW Disposition [phone]

## 2016-11-30 NOTE — ED Notes (Signed)
TTS in progress. Pt begins talking loudly and cursing tts counselor. Pt pushes TTS machine into hallway.

## 2016-11-30 NOTE — ED Notes (Signed)
Pt asked to replace street clothes with maroon scrubs per polkicy. Pt becomes agitated and refuses. Explained that pt has no choice as IVC andf this is safety policy. Pt becomes verbally aggressive and threatens to kill me "I'M ging to shoot you bitch."Security called and pt changed to scrubs .

## 2016-11-30 NOTE — Consult Note (Signed)
Telepsych Consultation   Reason for Consult: Yvonne [name], a 51 y.o. female who presents to [facility] ED with handcuffs in place from [facility] because pt has not eaten in 7 days and refused medication.  Referring Physician:  EDP Patient Identification: Yvonne [name] MRN:  [id number] Principal Diagnosis: <principal problem not specified> Diagnosis:   Patient Active Problem List   Diagnosis Date Noted  . Facial cellulitis [X21.194] 02/10/2014  . Bipolar I disorder, most recent episode (or current) depressed, severe, without mention of psychotic behavior [F31.4] 04/25/2013  . Bipolar disorder, unspecified ([place]) [F31.9] 01/02/2013  . MDD (major depressive disorder) [F32.9] 08/02/2012    Total Time spent with patient: 15 minutes  Subjective:  Yvonne [name] is a 51 y.o. female patient admitted with Yvonne [name] is an 51 y.o. female, who presents [facility] per ED report: Pt arrives with handcuffs in place from [facility] because pt has not eaten in 7 days and refused medication. Pt speaks in pressured speech and screaming. IVC paperwork with pt.Pt from [place].  HPI:  Per assessment by Cheryle Horsfall on 11/29/16, "Yvonne [name] is an 52 y.o. female, who presents to [facility] per ED report: Pt arrives with handcuffs in place from [facility] because pt has not eaten in 7 days and refused medication. Pt speaks in pressured speech and screaming. IVC paperwork with pt.Pt from [place]. Patient non-cooperative and refused participate in assessment".  On Exam: pt was actively psychotic during this encounter and did not engage with the Probation officer. Pt continues to repeat the same sentence over and over, "I want to get my hair braided, can you braid my hair?". Pt was out of contact with reality and this assessment could not be completed.  Past Psychiatric History: See H&P  Risk to Self: Suicidal Ideation:  (UTA) Suicidal Intent:  (UTA) Is patient at risk for suicide?: Yes Suicidal Plan?:  (UTA) Access to  Means:  (UTA) What has been your use of drugs/alcohol within the last 12 months?:  (UTA) How many times?:  (UTA) Other Self Harm Risks:  (UTA) Triggers for Past Attempts:  (UTA) Intentional Self Injurious Behavior:  (UTA) Risk to Others: Homicidal Ideation:  (UTA) Thoughts of Harm to Others:  (UTA) Current Homicidal Intent:  (UTA) Current Homicidal Plan:  (UTA) Access to Homicidal Means:  (UTA) Identified Victim:  (UTA) History of harm to others?:  (UTA) Assessment of Violence:  (UTA) Violent Behavior Description: ' (UTA) Does patient have access to weapons?:  (UTA) Criminal Charges Pending?:  (UTA) Does patient have a court date:  Special educational needs teacher) Prior Inpatient Therapy: Prior Inpatient Therapy: Yes Prior Therapy Dates: 2014 Prior Therapy Facilty/Provider(s): [facility] Reason for Treatment: Manic Prior Outpatient Therapy: Prior Outpatient Therapy:  (UTA) Prior Therapy Dates: N/A Prior Therapy Facilty/Provider(s): N/A Reason for Treatment: N/A Does patient have an ACCT team?:  (UTA) Does patient have Intensive In-House Services?  :  (UTA) Does patient have [facility] services? :  (UTA) Does patient have P4CC services?:  (UTA)  Past Medical History:  Past Medical History:  Diagnosis Date  . Bipolar 1 disorder ([place])   . Medical history non-contributory   . Mental disorder   . No pertinent past medical history     Past Surgical History:  Procedure Laterality Date  . NO PAST SURGERIES     Family History:  Family History  Problem Relation Age of Onset  . Depression Sister   . Depression Brother   . Depression Sister   .  CAD Mother   . Hypertension Father   . Diabetes Father   . Bipolar disorder Cousin    Family Psychiatric  History: Unknown Social History:  History  Alcohol Use No     History  Drug Use No    Social History   Social History  . Marital status: Single    Spouse name: N/A  . Number of children: N/A  . Years of education: N/A   Social History Main Topics   . Smoking status: Never Smoker  . Smokeless tobacco: Never Used  . Alcohol use No  . Drug use: No  . Sexual activity: No   Other Topics Concern  . None   Social History Narrative  . None   Additional Social History:    Allergies:  No Known Allergies  Labs:  Results for orders placed or performed during the hospital encounter of 11/29/16 (from the past 48 hour(s))  Comprehensive metabolic panel     Status: Abnormal   Collection Time: 11/29/16  6:55 PM  Result Value Ref Range   Sodium 138 135 - 145 mmol/L   Potassium 3.0 (L) 3.5 - 5.1 mmol/L   Chloride 101 101 - 111 mmol/L   CO2 22 22 - 32 mmol/L   Glucose, Bld 76 65 - 99 mg/dL   BUN 9 6 - 20 mg/dL   Creatinine, Ser 0.78 0.44 - 1.00 mg/dL   Calcium 9.0 8.9 - 10.3 mg/dL   Total Protein 7.2 6.5 - 8.1 g/dL   Albumin 3.5 3.5 - 5.0 g/dL   AST 55 (H) 15 - 41 U/L   ALT 44 14 - 54 U/L   Alkaline Phosphatase 48 38 - 126 U/L   Total Bilirubin 1.5 (H) 0.3 - 1.2 mg/dL   GFR calc non Af Amer >60 >60 mL/min   GFR calc Af Amer >60 >60 mL/min    Comment: (NOTE) The eGFR has been calculated using the CKD EPI equation. This calculation has not been validated in all clinical situations. eGFR's persistently <60 mL/min signify possible Chronic Kidney Disease.    Anion gap 15 5 - 15  CBC with Diff     Status: Abnormal   Collection Time: 11/29/16  6:55 PM  Result Value Ref Range   WBC 2.6 (L) 4.0 - 10.5 K/uL   RBC 4.01 3.87 - 5.11 MIL/uL   Hemoglobin 10.9 (L) 12.0 - 15.0 g/dL   HCT 32.7 (L) 36.0 - 46.0 %   MCV 81.5 78.0 - 100.0 fL   MCH 27.2 26.0 - 34.0 pg   MCHC 33.3 30.0 - 36.0 g/dL   RDW 13.0 11.5 - 15.5 %   Platelets 210 150 - 400 K/uL   Neutrophils Relative % 54 %   Lymphocytes Relative 34 %   Monocytes Relative 12 %   Eosinophils Relative 0 %   Basophils Relative 0 %   Neutro Abs 1.4 (L) 1.7 - 7.7 K/uL   Lymphs Abs 0.9 0.7 - 4.0 K/uL   Monocytes Absolute 0.3 0.1 - 1.0 K/uL   Eosinophils Absolute 0.0 0.0 - 0.7 K/uL    Basophils Absolute 0.0 0.0 - 0.1 K/uL    Current Facility-Administered Medications  Medication Dose Route Frequency Provider Last Rate Last Dose  . ibuprofen (ADVIL,MOTRIN) tablet 600 mg  600 mg Oral Q8H PRN Nanavati, Ankit, MD      . LORazepam (ATIVAN) tablet 1 mg  1 mg Oral Q8H PRN Varney Biles, MD   1 mg at 11/30/16 1311  .  magnesium oxide (MAG-OX) tablet 800 mg  800 mg Oral Once Nanavati, Ankit, MD      . ondansetron (ZOFRAN) tablet 4 mg  4 mg Oral Q8H PRN Nanavati, Ankit, MD      . potassium chloride SA (K-DUR,KLOR-CON) CR tablet 60 mEq  60 mEq Oral Once Nanavati, Ankit, MD      . zolpidem (AMBIEN) tablet 5 mg  5 mg Oral QHS PRN Varney Biles, MD       Current Outpatient Prescriptions  Medication Sig Dispense Refill  . carbamazepine (TEGRETOL XR) 400 MG 12 hr tablet Take 1 tablet (400 mg total) by mouth daily. 60 tablet 1  . citalopram (CELEXA) 40 MG tablet Take 1 tablet (40 mg total) by mouth daily. 30 tablet 2    Musculoskeletal: UTA via telepsych  Psychiatric Specialty Exam: Physical Exam  Nursing note and vitals reviewed.   Review of Systems  Psychiatric/Behavioral: Positive for hallucinations and substance abuse.  All other systems reviewed and are negative.   Blood pressure 112/85, pulse (!) 117, temperature 98.4 F (36.9 C), temperature source Oral, resp. rate 18, height _0  (1.6 m), weight 63.5 kg (140 lb), SpO2 100 %.Body mass index is 24.8 kg/m.  General Appearance: Disheveled  Eye Contact:  Absent  Speech:  Pressured  Volume:  Increased  Mood:  Euphoric  Affect:  yelling, loud  Thought Process:  Disorganized and Irrelevant  Orientation:  Other:  UTA  Thought Content:  Illogical and Ideas of Reference:   Paranoia Delusions  Suicidal Thoughts:  UTA  Homicidal Thoughts:  UTA  Memory:  UTA  Judgement:  Other:  UTA  Insight:  UTA  Psychomotor Activity:  UTA  Concentration:  Concentration: UTA and Attention Span: Poor  Recall:  [employer] of  Knowledge:  UTA  Language:  UTA  Akathisia:  UTA  Handed:  Right  AIMS (if indicated):     Assets:  Others:  UTA  ADL's:  Impaired  Cognition:  Impaired,  Moderate  Sleep:        Treatment Plan Summary: Daily contact with patient to assess and evaluate symptoms and progress in treatment, Medication management and Plan is to admit patient to inpatient facility.  Disposition: Recommend psychiatric Inpatient admission when medically cleared. Supportive therapy provided about ongoing stressors. Refer to IOP. Discussed crisis plan, support from social network, calling 911, coming to the Emergency Department, and calling Suicide Hotline.  Vicenta Aly, NP 11/30/2016 7:33 PM  Agree with NP assessment

## 2016-11-30 NOTE — ED Notes (Signed)
TTS begun, pt not cooperative.

## 2016-11-30 NOTE — ED Notes (Signed)
Pt states that she is leaving tonight. States that she has to contact her husband who she has not spoken to in 30 years and has to call her job at [employer] to notify them of why she is not there.

## 2016-11-30 NOTE — ED Notes (Signed)
Pt no longer threatening but talks in baby voice and states she is a baby. Pt talking constantly with preassured speech. Awaiting TTS.

## 2016-11-30 NOTE — ED Notes (Signed)
Pt sitting in bed eating dinner and tolerating wwell.

## 2016-11-30 NOTE — ED Notes (Signed)
A Regular Diet was Ordered for Lunch. 

## 2016-11-30 NOTE — ED Notes (Addendum)
Pt becoming more agitated and coming out of room. Constantly "remakes" bed. PA aware

## 2016-12-01 NOTE — ED Notes (Signed)
Patient escorted out by security and sheriff to be transported to [facility].

## 2016-12-01 NOTE — ED Notes (Signed)
Pt refused vitals 

## 2017-01-02 ENCOUNTER — Encounter (HOSPITAL_COMMUNITY): Payer: Self-pay | Admitting: Emergency Medicine

## 2017-01-02 ENCOUNTER — Emergency Department (HOSPITAL_COMMUNITY)
Admission: EM | Admit: 2017-01-02 | Discharge: 2017-01-05 | Disposition: A | Discharge status: Other Acute Inpatient Hospital | Payer: 59 | Attending: Emergency Medicine | Admitting: Emergency Medicine

## 2017-01-02 DIAGNOSIS — F3010 Manic episode without psychotic symptoms, unspecified: Secondary | ICD-10-CM | POA: Diagnosis not present

## 2017-01-02 DIAGNOSIS — F918 Other conduct disorders: Secondary | ICD-10-CM | POA: Diagnosis present

## 2017-01-02 DIAGNOSIS — Z9114 Patient's other noncompliance with medication regimen: Secondary | ICD-10-CM | POA: Diagnosis not present

## 2017-01-02 DIAGNOSIS — Z79899 Other long term (current) drug therapy: Secondary | ICD-10-CM | POA: Diagnosis not present

## 2017-01-02 DIAGNOSIS — G4700 Insomnia, unspecified: Secondary | ICD-10-CM | POA: Diagnosis not present

## 2017-01-02 DIAGNOSIS — Z046 Encounter for general psychiatric examination, requested by authority: Secondary | ICD-10-CM

## 2017-01-02 DIAGNOSIS — F309 Manic episode, unspecified: Secondary | ICD-10-CM | POA: Diagnosis not present

## 2017-01-02 DIAGNOSIS — F3163 Bipolar disorder, current episode mixed, severe, without psychotic features: Secondary | ICD-10-CM | POA: Diagnosis not present

## 2017-01-02 DIAGNOSIS — Z818 Family history of other mental and behavioral disorders: Secondary | ICD-10-CM | POA: Diagnosis not present

## 2017-01-02 LAB — CBC
HCT: 29.7 % — ABNORMAL LOW (ref 36.0–46.0)
Hemoglobin: 9.9 g/dL — ABNORMAL LOW (ref 12.0–15.0)
MCH: 28.4 pg (ref 26.0–34.0)
MCHC: 33.3 g/dL (ref 30.0–36.0)
MCV: 85.1 fL (ref 78.0–100.0)
PLATELETS: 244 10*3/uL (ref 150–400)
RBC: 3.49 MIL/uL — AB (ref 3.87–5.11)
RDW: 14.7 % (ref 11.5–15.5)
WBC: 5.8 10*3/uL (ref 4.0–10.5)

## 2017-01-02 LAB — COMPREHENSIVE METABOLIC PANEL
ALT: 25 U/L (ref 14–54)
AST: 40 U/L (ref 15–41)
Albumin: 3.9 g/dL (ref 3.5–5.0)
Alkaline Phosphatase: 62 U/L (ref 38–126)
Anion gap: 11 (ref 5–15)
BILIRUBIN TOTAL: 0.7 mg/dL (ref 0.3–1.2)
BUN: 7 mg/dL (ref 6–20)
CHLORIDE: 109 mmol/L (ref 101–111)
CO2: 23 mmol/L (ref 22–32)
Calcium: 8.6 mg/dL — ABNORMAL LOW (ref 8.9–10.3)
Creatinine, Ser: 0.69 mg/dL (ref 0.44–1.00)
Glucose, Bld: 118 mg/dL — ABNORMAL HIGH (ref 65–99)
POTASSIUM: 3.4 mmol/L — AB (ref 3.5–5.1)
Sodium: 143 mmol/L (ref 135–145)
TOTAL PROTEIN: 7.6 g/dL (ref 6.5–8.1)

## 2017-01-02 LAB — ACETAMINOPHEN LEVEL: Acetaminophen (Tylenol), Serum: <10 ug/mL — ABNORMAL LOW (ref 10–30)

## 2017-01-02 LAB — RAPID URINE DRUG SCREEN, HOSP PERFORMED
AMPHETAMINES: NOT DETECTED
BENZODIAZEPINES: NOT DETECTED
Barbiturates: NOT DETECTED
Cocaine: NOT DETECTED
OPIATES: NOT DETECTED
Tetrahydrocannabinol: NOT DETECTED

## 2017-01-02 LAB — SALICYLATE LEVEL

## 2017-01-02 LAB — ETHANOL

## 2017-01-02 MED ORDER — LIP MEDEX EX OINT
TOPICAL_OINTMENT | Freq: Once | CUTANEOUS | Status: DC
  Filled 2017-01-02: qty 7

## 2017-01-02 MED ORDER — ZIPRASIDONE MESYLATE 20 MG IM SOLR
INTRAMUSCULAR | Status: AC
  Administered 2017-01-02: 20 mg
  Filled 2017-01-02: qty 20

## 2017-01-02 MED ORDER — OLANZAPINE 10 MG PO TBDP
10.0000 mg | ORAL_TABLET | Freq: Every day | ORAL | Status: DC
  Filled 2017-01-02: qty 1

## 2017-01-02 MED ORDER — OLANZAPINE 10 MG PO TBDP
10.0000 mg | ORAL_TABLET | Freq: Three times a day (TID) | ORAL | Status: DC | PRN
  Filled 2017-01-02: qty 1

## 2017-01-02 MED ORDER — LORAZEPAM 2 MG/ML IJ SOLN
2.0000 mg | Freq: Once | INTRAMUSCULAR | Status: AC
  Administered 2017-01-02: 2 mg via INTRAMUSCULAR

## 2017-01-02 MED ORDER — CARBAMAZEPINE ER 200 MG PO TB12: 400.0000 mg | ORAL_TABLET | Freq: Every day | ORAL | Status: DC

## 2017-01-02 MED ORDER — CARBAMAZEPINE ER 200 MG PO TB12
400.0000 mg | ORAL_TABLET | Freq: Every day | ORAL | Status: DC
  Administered 2017-01-03 – 2017-01-05 (×3): 400 mg via ORAL
  Filled 2017-01-02 (×4): qty 2

## 2017-01-02 MED ORDER — ZIPRASIDONE MESYLATE 20 MG IM SOLR
20.0000 mg | Freq: Once | INTRAMUSCULAR | Status: AC
  Administered 2017-01-02: 20 mg via INTRAMUSCULAR

## 2017-01-02 MED ORDER — CITALOPRAM HYDROBROMIDE 10 MG PO TABS
20.0000 mg | ORAL_TABLET | Freq: Every day | ORAL | Status: DC
  Administered 2017-01-03 – 2017-01-05 (×3): 20 mg via ORAL
  Filled 2017-01-02 (×4): qty 2

## 2017-01-02 MED ORDER — CITALOPRAM HYDROBROMIDE 10 MG PO TABS: 40.0000 mg | ORAL_TABLET | Freq: Every day | ORAL | Status: DC

## 2017-01-02 MED ORDER — DIPHENHYDRAMINE HCL 50 MG/ML IJ SOLN
50.0000 mg | Freq: Once | INTRAMUSCULAR | Status: AC
  Administered 2017-01-02: 50 mg via INTRAMUSCULAR

## 2017-01-02 MED ORDER — ZIPRASIDONE MESYLATE 20 MG IM SOLR
INTRAMUSCULAR | Status: AC
  Filled 2017-01-02: qty 20

## 2017-01-02 MED ORDER — DIPHENHYDRAMINE HCL 50 MG/ML IJ SOLN
INTRAMUSCULAR | Status: AC
  Administered 2017-01-02: 50 mg via INTRAMUSCULAR
  Filled 2017-01-02: qty 1

## 2017-01-02 MED ORDER — STERILE WATER FOR INJECTION IJ SOLN
INTRAMUSCULAR | Status: AC
  Administered 2017-01-02: 1.2 mL
  Filled 2017-01-02: qty 10

## 2017-01-02 MED ORDER — STERILE WATER FOR INJECTION IJ SOLN
INTRAMUSCULAR | Status: AC
  Administered 2017-01-02: 1 mL
  Filled 2017-01-02: qty 10

## 2017-01-02 MED ORDER — LORAZEPAM 2 MG/ML IJ SOLN
INTRAMUSCULAR | Status: AC
  Administered 2017-01-02: 2 mg via INTRAMUSCULAR
  Filled 2017-01-02: qty 1

## 2017-01-02 NOTE — Progress Notes (Signed)
TTS ordered at 06:16, TTS went to assess pt. Pt is sleeping and unable to be aroused at this time. TTS obtained a copy of the pt's IVC for review and will be provided to AM TTS counselor in order to complete assessment once the pt is able to fully participate.  Princess BruinsAquicha Sybrina Laning, MSW, LCSWA TTS Specialist [phone]

## 2017-01-02 NOTE — ED Notes (Signed)
Pulse 98 manual.

## 2017-01-02 NOTE — ED Notes (Signed)
Hourly rounding reveals patient in room. No complaints, stable, in no acute distress. Q15 minute rounds and monitoring via Security Cameras to continue.Snack and beverage given. 

## 2017-01-02 NOTE — ED Notes (Signed)
Report to include situation, background, assessment and recommendations from Latrisha RN. Patient sleeping, respirations regular and unlabored. Q15 minute rounds and security camera observation to continue.   

## 2017-01-02 NOTE — ED Notes (Signed)
Pt agitated, screaming at staff, demanding to go home. Refusing po meds.  Security at bedside to assist with IM injections.

## 2017-01-02 NOTE — ED Notes (Addendum)
Pt. irritable stating she is unwilling to take evening meds.

## 2017-01-02 NOTE — ED Notes (Signed)
Hourly rounding reveals patient sleeping in room. No complaints, stable, in no acute distress. Q15 minute rounds and monitoring via Security Cameras to continue. 

## 2017-01-02 NOTE — BH Assessment (Addendum)
Assessment Note  Yvonne [name] is an 51 y.o. female, who was IVC'd and transported to [facility] by [employer] for combative behavior.  Patient provided a small amount of information and then exited the room.  Patient reports being at [facility] home and her Nephew called [employer].  Patient admitted urinating in the street and refusing to leave Sister's home.  Patient reports being admitted to [facility] after leaving [facility] on 5-23 or 24-2018.  She reports being at [facility] for 10 days.  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  Patient exited the room before other assessment information could be obtained.  Disposition, Patient meets inpatient criteria.       Diagnosis: Bipolar I Disorder, manic episode  Past Medical History:  Past Medical History:  Diagnosis Date  . Bipolar 1 disorder (HCC)   . Medical history non-contributory   . Mental disorder   . No pertinent past medical history     Past Surgical History:  Procedure Laterality Date  . NO PAST SURGERIES      Family History:  Family History  Problem Relation Age of Onset  . Depression Sister   . Depression Brother   . Depression Sister   . CAD Mother   . Hypertension Father   . Diabetes Father   . Bipolar disorder Cousin     Social History:  reports that she has never smoked. She has never used smokeless tobacco. She reports that she does not drink alcohol or use drugs.  Additional Social History:  Alcohol / Drug Use History of alcohol / drug use?: Yes (Unable to assess)  CIWA: CIWA-Ar BP: 120/84 Pulse Rate: 98 COWS:    Allergies: No Known Allergies  Home Medications:  (Not in a hospital admission)  OB/GYN Status:  No LMP recorded. Patient is not currently having periods (Reason: Perimenopausal).  General Assessment Data Location of Assessment: [facility] ED TTS  Assessment: In system Is this a Tele or Face-to-Face Assessment?: Face-to-Face Is this an Initial Assessment or a Re-assessment for this encounter?: Initial Assessment Marital status: Single Maiden name: Karapetian Is patient pregnant?: No Pregnancy Status: No Living Arrangements: Other (Comment) (Homeless) Can pt return to current living arrangement?: Yes Admission Status: Involuntary Is patient capable of signing voluntary admission?: No Referral Source: Self/Family/Friend ([employer]) Insurance type: UHC  Medical Screening Exam [facility] Walk-in ONLY) Medical Exam completed: Yes  Crisis Care Plan Living Arrangements: Other (Comment) (Homeless) Legal Guardian: Other: (Self) Name of Psychiatrist: UTA Name of Therapist: UTA  Education Status Is patient currently in school?: No Current Grade: N/A Highest grade of school patient has completed: UTA Name of school: UTA Contact person: UTA  Risk to self with the past 6 months Suicidal Ideation: No Has patient been a risk to self within the past 6 months prior to admission? : No Suicidal Intent: No Has patient had any suicidal intent within the past 6 months prior to admission? : No Is patient at risk for suicide?: No Suicidal Plan?: No Has patient had any suicidal plan within the past 6 months prior to admission? : No Access to Means: No What has been your use of drugs/alcohol within the last 12 months?: UTA Previous Attempts/Gestures: No How many times?: 0 Other Self Harm Risks: UTA Triggers for Past Attempts: Unknown Intentional Self Injurious Behavior: None Family Suicide History: Unable to assess Recent stressful life event(s): Other (Comment) (UTA)  Persecutory voices/beliefs?: No Depression: No Substance abuse history and/or treatment for substance abuse?: No (UTA) Suicide prevention information given to non-admitted patients: Not applicable  Risk to Others within the past 6 months Homicidal Ideation: No Does patient have any  lifetime risk of violence toward others beyond the six months prior to admission? : Unknown Thoughts of Harm to Others:  (UTA) Current Homicidal Intent:  (UTA) Current Homicidal Plan:  (UTA) Access to Homicidal Means:  (UTA) Identified Victim: N/A History of harm to others?:  (UTA) Assessment of Violence: On admission Violent Behavior Description: Combative w/[employer] Does patient have access to weapons?:  (UTA) Criminal Charges Pending?:  (UTA) Does patient have a court date:  (UTA) Is patient on probation?: Unknown  Psychosis Hallucinations:  (UTA) Delusions:  (UTA)  Mental Status Report Appearance/Hygiene: Disheveled, Body odor, Poor hygiene Eye Contact: Fair Motor Activity: Agitation Speech: Pressured, Loud, Argumentative Level of Consciousness: Alert, Irritable Mood: Angry Affect: Angry, Irritable Anxiety Level: Moderate Thought Processes: Circumstantial Judgement: Impaired Orientation: Person, Place, Time Obsessive Compulsive Thoughts/Behaviors: Unable to Assess  Cognitive Functioning Concentration: Poor Memory: Unable to Assess IQ: Average Insight: Poor Impulse Control: Poor Appetite:  (UTA) Weight Loss:  (UTA) Weight Gain:  (UTA) Sleep: Unable to Assess Total Hours of Sleep:  (UTA) Vegetative Symptoms: Unable to Assess  ADLScreening [facility] Assessment Services) Patient's cognitive ability adequate to safely complete daily activities?: Yes Patient able to express need for assistance with ADLs?: Yes Independently performs ADLs?: Yes (appropriate for developmental age)  Prior Inpatient Therapy Prior Inpatient Therapy: Yes Prior Therapy Dates: 2014 [facility] Reason for Treatment: Manic  Prior Outpatient Therapy Prior Outpatient Therapy:  (UTA) Prior Therapy Dates: N/A Prior Therapy Facilty/Provider(s): N/A Reason for Treatment: N/A Does patient have an ACCT team?: Unknown Does patient have Intensive In-House Services?  :  No Does patient have [facility] services? : No Does patient have P4CC services?: No  ADL Screening (condition at time of admission) Patient's cognitive ability adequate to safely complete daily activities?: Yes Is the patient deaf or have difficulty hearing?: No Does the patient have difficulty seeing, even when wearing glasses/contacts?: No Does the patient have difficulty concentrating, remembering, or making decisions?: No Patient able to express need for assistance with ADLs?: Yes Does the patient have difficulty dressing or bathing?: No Independently performs ADLs?: Yes (appropriate for developmental age) Does the patient have difficulty walking or climbing stairs?: No Weakness of Legs: None Weakness of Arms/Hands: None  Home Assistive Devices/Equipment Home Assistive Devices/Equipment: None    Abuse/Neglect Assessment (Assessment to be complete while patient is alone) Physical Abuse: Denies Verbal Abuse: Denies Sexual Abuse: Denies Exploitation of patient/patient's resources: Denies Self-Neglect: Denies     Merchant navy officerAdvance Directives (For Healthcare) Does Patient Have a Medical Advance Directive?: No Would patient like information on creating a medical advance directive?: No - Patient declined (Patient manic)    Additional Information 1:1 In Past 12 Months?:  (UTA) CIRT Risk: Yes Elopement Risk: Yes Does patient have medical clearance?: Yes     Disposition:  Disposition Initial Assessment Completed for this Encounter: Yes Disposition of Patient: Other dispositions Other disposition(s): Other (Comment) (Pending)  On Site Evaluation by:   Reviewed with Physician:    Dey-Johnson,Gelila Well 01/02/2017 10:13 AM

## 2017-01-02 NOTE — Progress Notes (Signed)
CSW faxed patient's referral to: [facility], [facility], [facility], [facility], [address] [facility] [place].  Celso SickleKimberly Telford Archambeau, LCSWA [facility]  Clinical Social Worker [phone]

## 2017-01-02 NOTE — ED Notes (Signed)
Pt presents with manic behavior, and aggressive off meds.  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  A&O x 3, no distress noted, calm, cooperative and anxious at present.  Monitoring for safety, Q 15 min checks in effect.  Safety check for contraband completed, no items found.

## 2017-01-02 NOTE — ED Notes (Signed)
Bed: WA17 Expected date:  Expected time:  Means of arrival:  Comments: [employer] 

## 2017-01-02 NOTE — Progress Notes (Signed)
CSW filed patient's examination and recommendation paperwork into IVC logbook.  Jahnaya Branscome, LCSWA [facility]  Clinical Social Worker [phone] 

## 2017-01-02 NOTE — ED Provider Notes (Signed)
[facility]-EMERGENCY DEPT Provider Note   CSN: [id number] Arrival date & time: 01/02/17  0041     History   Chief Complaint Chief Complaint  Patient presents with  . Aggressive Behavior  . Manic Behavior    HPI Yvonne [name] is a 51 y.o. female.  51 year old female with a history of bipolar 1 disorder and acute psychosis presents to the emergency department under involuntary commitment taken out by sister. IVC papers reference that patient has been staying with her sister and becoming more belligerent over the past few days. Patient's sister notes that she has become more aggressive with her and family. Patient previously admitting to noncompliance with her bipolar medications. Patient acutely agitated, handcuffed to the bed for safety. Patient states, "You better tell these officers to take these cuffs off me, bitch. 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 You better go out there an get me something to eat, bitch". Officers report state of mania x 2 hours.   The history is provided by the patient. No language interpreter was used.    Past Medical History:  Diagnosis Date  . Bipolar 1 disorder (HCC)   . Medical history non-contributory   . Mental disorder   . No pertinent past medical history     Patient Active Problem List   Diagnosis Date Noted  . Facial cellulitis 02/10/2014  . Bipolar I disorder, most recent episode (or current) depressed, severe, without mention of psychotic behavior 04/25/2013  . Bipolar disorder, unspecified (HCC) 01/02/2013  . MDD (major depressive disorder) 08/02/2012    Past Surgical History:  Procedure Laterality Date  . NO PAST SURGERIES      OB History    No data available       Home Medications    Prior to Admission medications   Medication Sig Start Date End Date Taking? Authorizing Provider  carbamazepine (TEGRETOL XR) 400 MG 12 hr tablet Take 1 tablet (400 mg total) by mouth daily. 10/15/15   Pete GlatterLangeland,  Dawn T, MD  citalopram (CELEXA) 40 MG tablet Take 1 tablet (40 mg total) by mouth daily. 02/02/16   Pete GlatterLangeland, Dawn T, MD    Family History Family History  Problem Relation Age of Onset  . Depression Sister   . Depression Brother   . Depression Sister   . CAD Mother   . Hypertension Father   . Diabetes Father   . Bipolar disorder Cousin     Social History Social History  Substance Use Topics  . Smoking status: Never Smoker  . Smokeless tobacco: Never Used  . Alcohol use No     Allergies   Patient has no known allergies.   Review of Systems Review of Systems Ten systems reviewed and are negative for acute change, except as noted in the HPI.    Physical Exam Updated Vital Signs There were no vitals taken for this visit.  Physical Exam  Constitutional: She is oriented to person, place, and time. She appears well-developed and well-nourished. No distress.  Patient handcuffed to bed. Agitated, belligerent.   HENT:  Head: Normocephalic and atraumatic.  Eyes: Conjunctivae and EOM are normal. No scleral icterus.  Neck: Normal range of motion.  Pulmonary/Chest: Effort normal. No respiratory distress.  Respirations even, unlabored  Musculoskeletal: Normal range of motion.  Neurological: She is alert and oriented to person, place, and time. She exhibits normal muscle tone. Coordination normal.  GCS 15. Speech clear. Patient moving all extremities.  Skin: Skin is  warm and dry. No rash noted. She is not diaphoretic. No erythema. No pallor.  Psychiatric: Her affect is angry. Her speech is rapid and/or pressured. She is agitated and aggressive.  Nursing note and vitals reviewed.    ED Treatments / Results  Labs (all labs ordered are listed, but only abnormal results are displayed) Labs Reviewed  COMPREHENSIVE METABOLIC PANEL - Abnormal; Notable for the following:       Result Value   Potassium 3.4 (*)    Glucose, Bld 118 (*)    Calcium 8.6 (*)    All other components  within normal limits  ACETAMINOPHEN LEVEL - Abnormal; Notable for the following:    Acetaminophen (Tylenol), Serum <10 (*)    All other components within normal limits  CBC - Abnormal; Notable for the following:    RBC 3.49 (*)    Hemoglobin 9.9 (*)    HCT 29.7 (*)    All other components within normal limits  ETHANOL  SALICYLATE LEVEL  RAPID URINE DRUG SCREEN, HOSP PERFORMED  I-STAT BETA HCG BLOOD, ED ([facility], [facility], [facility] ONLY)    EKG  EKG Interpretation None       Radiology No results found.  Procedures Procedures (including critical care time)  Medications Ordered in ED Medications  lip balm (CARMEX) ointment (0 application Topical Hold 01/02/17 0220)  ziprasidone (GEODON) injection 20 mg (20 mg Intramuscular Given 01/02/17 0124)  ziprasidone (GEODON) 20 MG injection (20 mg  Given 01/02/17 0132)  sterile water (preservative free) injection (1 mL  Given 01/02/17 0132)     Initial Impression / Assessment and Plan / ED Course  I have reviewed the triage vital signs and the nursing notes.  Pertinent labs & imaging results that were available during my care of the patient were reviewed by me and considered in my medical decision making (see chart for details).     51 year old female presents under involuntary commitment taken out by sister. She required Geodon on arrival for agitation. She is now up and ambulatory. Agitation has improved, the patient is still ornery. She has been medically cleared. Pending TTS evaluation and recommendations. Disposition to be determined by oncoming ED provider.   Final Clinical Impressions(s) / ED Diagnoses   Final diagnoses:  Manic behavior (HCC)  Involuntary commitment  History of medication noncompliance    New Prescriptions New Prescriptions   No medications on file     Antony Madura, Cordelia Poche 01/02/17 0701    Derwood Kaplan, MD 01/05/17 1800

## 2017-01-02 NOTE — ED Notes (Signed)
Hourly rounding reveals patient in room. PT. Complains of anxiety related to her family not being supportive, stable, in no acute distress. Q15 minute rounds and monitoring via [employer] to continue.

## 2017-01-02 NOTE — ED Triage Notes (Signed)
Pt comes to ed, via [employer]. Pt is off her meds and combative described as manic state.  Pt keeps saying "she is a veteran, and no one can touch her", pt is homeless. V/s within normal ranges reported by ems. Pt is in police hand cuffs and [employer] in room.

## 2017-01-03 DIAGNOSIS — G4700 Insomnia, unspecified: Secondary | ICD-10-CM | POA: Diagnosis not present

## 2017-01-03 DIAGNOSIS — Z818 Family history of other mental and behavioral disorders: Secondary | ICD-10-CM

## 2017-01-03 DIAGNOSIS — Z79899 Other long term (current) drug therapy: Secondary | ICD-10-CM | POA: Diagnosis not present

## 2017-01-03 DIAGNOSIS — F3163 Bipolar disorder, current episode mixed, severe, without psychotic features: Secondary | ICD-10-CM

## 2017-01-03 MED ORDER — CHLORPROMAZINE HCL 25 MG/ML IJ SOLN
25.0000 mg | Freq: Once | INTRAMUSCULAR | Status: AC
  Administered 2017-01-03: 25 mg via INTRAMUSCULAR
  Filled 2017-01-03: qty 1

## 2017-01-03 MED ORDER — LORAZEPAM 2 MG/ML IJ SOLN
1.0000 mg | Freq: Once | INTRAMUSCULAR | Status: AC
  Administered 2017-01-03: 1 mg via INTRAMUSCULAR
  Filled 2017-01-03: qty 1

## 2017-01-03 MED ORDER — DIPHENHYDRAMINE HCL 50 MG/ML IJ SOLN
25.0000 mg | Freq: Once | INTRAMUSCULAR | Status: AC
  Administered 2017-01-03: 25 mg via INTRAMUSCULAR
  Filled 2017-01-03: qty 1

## 2017-01-03 MED ORDER — ACETAMINOPHEN 325 MG PO TABS
650.0000 mg | ORAL_TABLET | Freq: Four times a day (QID) | ORAL | Status: DC | PRN
  Administered 2017-01-03: 650 mg via ORAL
  Filled 2017-01-03: qty 2

## 2017-01-03 NOTE — Progress Notes (Signed)
Patient hyper verbal with rapid, pressured speech. Patient offered support and encouragement. Patient cooperative with RNs exposed hip for medication administration. Will continue to monitor.

## 2017-01-03 NOTE — ED Notes (Signed)
Hourly rounding reveals patient sleeping room. No complaints, stable, in no acute distress. Q15 minute rounds and monitoring via [employer] to continue. 

## 2017-01-03 NOTE — ED Notes (Signed)
Hourly rounding reveals patient sleeping in room. No complaints, stable, in no acute distress. Q15 minute rounds and monitoring via Security Cameras to continue. 

## 2017-01-03 NOTE — Progress Notes (Signed)
01/03/17 1340:  LRT went to pt room to offer activities, pt was with nurse.  Caroll RancherMarjette Yazleen Molock, LRT/CTRS    01/03/17 1350:  Pt came down to the dayroom with nurse.  Pt observed peer, LRT and intern playing Jenga.  Pt stated she "I'll make that whole thing fall".  Pt watched for a minute before going back to her room.  Caroll RancherMarjette Logan [place], LRT/CTRS

## 2017-01-03 NOTE — BH Assessment (Signed)
[facility] Assessment Progress Note  Per Thedore MinsMojeed Akintayo, MD, this pt requires psychiatric hospitalization at this time.  Pt presents under IVC initiated by her sister, and upheld by Dr Sherrlyn HockAkintayo,MD.  The following facilities have been contacted to seek placement for this pt, with results as noted:  Beds available, information sent, decision pending:  [facility]   At capacity:  [facility]   Doylene Canninghomas Baylon Santelli, [place] Triage Specialist [phone]

## 2017-01-03 NOTE — ED Notes (Signed)
Pt presents with manic like behavior. Pt tangential, speech pressured, repetitive, flight of ideas. Pt grandiose. Reports "My credit limit is 100 thousand dollars." "I'm richer than Garnet Koyanagionald Trump." Encouragement and support provided. Special checks q 15 mins in place for safety, Video monitoring in place. Will continue to monitor.

## 2017-01-03 NOTE — ED Notes (Signed)
This nurse notified phlebotmist of ordered lab draw.

## 2017-01-03 NOTE — ED Notes (Signed)
Pt sleeping at present, no distress noted, Monitoring for safety, Q 15 min checks in effect. 

## 2017-01-03 NOTE — Consult Note (Signed)
[facility] Psychiatry face-to-face Consultation   Reason for Consult: Psychosis, agitation Referring Physician:  EDP Patient Identification: Yvonne [name] MRN:  [id number] Principal Diagnosis: Bipolar disord, crnt epsd mixed, severe, w/o psych features [facility]) Diagnosis:   Patient Active Problem List   Diagnosis Date Noted  . Bipolar disord, crnt epsd mixed, severe, w/o psych features ([place]) [F31.63] 04/25/2013    Priority: High  . Facial cellulitis [W58.099] 02/10/2014  . Bipolar disorder, unspecified ([place]) [F31.9] 01/02/2013  . MDD (major depressive disorder) [F32.9] 08/02/2012    Total Time spent with patient: 30 minutes  Subjective:  Yvonne [name] is a 51 y.o. female patient admitted with Yvonne [name] is an 51 y.o. female, who [facility] per ED report. Pt is very agitated, psychotic, irate, and disruptive. Continues to meet inpatient criteria.   HPI:  I have reviewed and concur with HPI elements below, modified as follows: "Yvonne [name] is an 51 y.o. female, who was IVC'd and transported to [facility] by [employer] for combative behavior.  Patient provided a small amount of information and then exited the room.  Patient reports being at [facility] home and her Nephew called [employer].  Patient admitted urinating in the street and refusing to leave Sister's home.  Patient reports being admitted to [facility] after leaving [facility] on 5-23 or 24-2018.  She reports being at [facility] for 10 days.  Patient reports currently being a "homeless Veteran."  Patient presented orientated x3, mood "angry at [facility] and [employer]", affect manic, speech pressured, thought process circumstantial, body language aggressive.  Patient exited the room before other assessment information could be obtained.  Disposition, Patient meets inpatient criteria.   "  Pt has been agitated in the ED and shouting at others, encouraging them to fight/assault staff on her behalf.   Past Psychiatric History: See H&P  Risk to Self: Suicidal  Ideation: No Suicidal Intent: No Is patient at risk for suicide?: No Suicidal Plan?: No Access to Means: No What has been your use of drugs/alcohol within the last 12 months?: UTA How many times?: 0 Other Self Harm Risks: UTA Triggers for Past Attempts: Unknown Intentional Self Injurious Behavior: None Risk to Others: Homicidal Ideation: No Thoughts of Harm to Others:  (UTA) Current Homicidal Intent:  (UTA) Current Homicidal Plan:  (UTA) Access to Homicidal Means:  (UTA) Identified Victim: N/A History of harm to others?:  (UTA) Assessment of Violence: On admission Violent Behavior Description: Combative w/[employer] Does patient have access to weapons?:  (UTA) Criminal Charges Pending?:  (UTA) Does patient have a court date:  (UTA) Prior Inpatient Therapy: Prior Inpatient Therapy: Yes Prior Therapy Dates: 2014 Prior Therapy Facilty/Provider(s): [facility] Reason for Treatment: Manic Prior Outpatient Therapy: Prior Outpatient Therapy:  (UTA) Prior Therapy Dates: N/A Prior Therapy Facilty/Provider(s): N/A Reason for Treatment: N/A Does patient have an ACCT team?: Unknown Does patient have Intensive In-House Services?  : No Does patient have [facility] services? : No Does patient have P4CC services?: No  Past Medical History:  Past Medical History:  Diagnosis Date  . Bipolar 1 disorder ([place])   . Medical history non-contributory   . Mental disorder   . No pertinent past medical history     Past Surgical History:  Procedure Laterality Date  . NO PAST SURGERIES     Family History:  Family History  Problem Relation Age of Onset  . Depression Sister   . Depression Brother   . Depression Sister   . CAD Mother   . Hypertension  Father   . Diabetes Father   . Bipolar disorder Cousin    Family Psychiatric  History: Unknown Social History:  History  Alcohol Use No     History  Drug Use No    Social History   Social History  . Marital status: Single    Spouse name: N/A  .  Number of children: N/A  . Years of education: N/A   Social History Main Topics  . Smoking status: Never Smoker  . Smokeless tobacco: Never Used  . Alcohol use No  . Drug use: No  . Sexual activity: No   Other Topics Concern  . None   Social History Narrative  . None   Additional Social History:    Allergies:  No Known Allergies  Labs:  Results for orders placed or performed during the hospital encounter of 01/02/17 (from the past 48 hour(s))  Rapid urine drug screen (hospital performed)     Status: None   Collection Time: 01/02/17 12:54 AM  Result Value Ref Range   Opiates NONE DETECTED NONE DETECTED   Cocaine NONE DETECTED NONE DETECTED   Benzodiazepines NONE DETECTED NONE DETECTED   Amphetamines NONE DETECTED NONE DETECTED   Tetrahydrocannabinol NONE DETECTED NONE DETECTED   Barbiturates NONE DETECTED NONE DETECTED    Comment:        DRUG SCREEN FOR MEDICAL PURPOSES ONLY.  IF CONFIRMATION IS NEEDED FOR ANY PURPOSE, NOTIFY LAB WITHIN 5 DAYS.        LOWEST DETECTABLE LIMITS FOR URINE DRUG SCREEN Drug Class       Cutoff (ng/mL) Amphetamine      1000 Barbiturate      200 Benzodiazepine   161 Tricyclics       096 Opiates          300 Cocaine          300 THC              50   Comprehensive metabolic panel     Status: Abnormal   Collection Time: 01/02/17  1:23 AM  Result Value Ref Range   Sodium 143 135 - 145 mmol/L   Potassium 3.4 (L) 3.5 - 5.1 mmol/L   Chloride 109 101 - 111 mmol/L   CO2 23 22 - 32 mmol/L   Glucose, Bld 118 (H) 65 - 99 mg/dL   BUN 7 6 - 20 mg/dL   Creatinine, Ser 0.69 0.44 - 1.00 mg/dL   Calcium 8.6 (L) 8.9 - 10.3 mg/dL   Total Protein 7.6 6.5 - 8.1 g/dL   Albumin 3.9 3.5 - 5.0 g/dL   AST 40 15 - 41 U/L   ALT 25 14 - 54 U/L   Alkaline Phosphatase 62 38 - 126 U/L   Total Bilirubin 0.7 0.3 - 1.2 mg/dL   GFR calc non Af Amer >60 >60 mL/min   GFR calc Af Amer >60 >60 mL/min    Comment: (NOTE) The eGFR has been calculated using the CKD  EPI equation. This calculation has not been validated in all clinical situations. eGFR's persistently <60 mL/min signify possible Chronic Kidney Disease.    Anion gap 11 5 - 15  Ethanol     Status: None   Collection Time: 01/02/17  1:23 AM  Result Value Ref Range   Alcohol, Ethyl (B) <5 <5 mg/dL    Comment:        LOWEST DETECTABLE LIMIT FOR SERUM ALCOHOL IS 5 mg/dL FOR MEDICAL PURPOSES ONLY   Salicylate level  Status: None   Collection Time: 01/02/17  1:23 AM  Result Value Ref Range   Salicylate Lvl <6.7 2.8 - 30.0 mg/dL  Acetaminophen level     Status: Abnormal   Collection Time: 01/02/17  1:23 AM  Result Value Ref Range   Acetaminophen (Tylenol), Serum <10 (L) 10 - 30 ug/mL    Comment:        THERAPEUTIC CONCENTRATIONS VARY SIGNIFICANTLY. A RANGE OF 10-30 ug/mL MAY BE AN EFFECTIVE CONCENTRATION FOR MANY PATIENTS. HOWEVER, SOME ARE BEST TREATED AT CONCENTRATIONS OUTSIDE THIS RANGE. ACETAMINOPHEN CONCENTRATIONS >150 ug/mL AT 4 HOURS AFTER INGESTION AND >50 ug/mL AT 12 HOURS AFTER INGESTION ARE OFTEN ASSOCIATED WITH TOXIC REACTIONS.   cbc     Status: Abnormal   Collection Time: 01/02/17  1:23 AM  Result Value Ref Range   WBC 5.8 4.0 - 10.5 K/uL   RBC 3.49 (L) 3.87 - 5.11 MIL/uL   Hemoglobin 9.9 (L) 12.0 - 15.0 g/dL   HCT 29.7 (L) 36.0 - 46.0 %   MCV 85.1 78.0 - 100.0 fL   MCH 28.4 26.0 - 34.0 pg   MCHC 33.3 30.0 - 36.0 g/dL   RDW 14.7 11.5 - 15.5 %   Platelets 244 150 - 400 K/uL    Current Facility-Administered Medications  Medication Dose Route Frequency Provider Last Rate Last Dose  . carbamazepine (TEGRETOL XR) 12 hr tablet 400 mg  400 mg Oral Daily Babatunde Seago, MD   400 mg at 01/03/17 0839  . citalopram (CELEXA) tablet 20 mg  20 mg Oral Daily Kresta Templeman, MD   20 mg at 01/03/17 0854  . lip balm (CARMEX) ointment   Topical Once Varney Biles, MD   Stopped at 01/02/17 0220  . OLANZapine zydis (ZYPREXA) disintegrating tablet 10 mg  10 mg Oral  Q8H PRN Shakiya Mcneary, MD      . OLANZapine zydis (ZYPREXA) disintegrating tablet 10 mg  10 mg Oral QHS Corena Pilgrim, MD       Current Outpatient Prescriptions  Medication Sig Dispense Refill  . carbamazepine (TEGRETOL XR) 400 MG 12 hr tablet Take 1 tablet (400 mg total) by mouth daily. (Patient not taking: Reported on 01/02/2017) 60 tablet 1  . citalopram (CELEXA) 40 MG tablet Take 1 tablet (40 mg total) by mouth daily. (Patient not taking: Reported on 01/02/2017) 30 tablet 2    Musculoskeletal: WNL, active  Psychiatric Specialty Exam: Physical Exam  Nursing note and vitals reviewed.   Review of Systems  Psychiatric/Behavioral: Positive for depression, hallucinations and substance abuse. The patient is nervous/anxious and has insomnia.   All other systems reviewed and are negative.   Blood pressure (!) 106/58, pulse 95, temperature 97.6 F (36.4 C), temperature source Oral, resp. rate 17, SpO2 99 %.There is no height or weight on file to calculate BMI.  General Appearance: Disheveled and bizarre  Eye Contact:  poor  Speech:  Pressured and agitated  Volume:  Increased  Mood:  Euphoric  Affect:  yelling, loud  Thought Process:  Disorganized and Irrelevant  Orientation:  Other:  UTA  Thought Content:  Illogical and Ideas of Reference:   Paranoia Delusions  Suicidal Thoughts:  denies  Homicidal Thoughts:  denies  Memory:  fair  Judgement:  impaired  Insight:  poor  Psychomotor Activity:  UTA  Concentration:  Concentration: UTA and Attention Span: Poor  Recall:  poor  Fund of Knowledge:  poor  Language:  good  Akathisia:  denies  Handed:  Right  AIMS (  if indicated):     Assets:  resilience  ADL's:  Impaired  Cognition:  Impaired,  Moderate  Sleep:       Treatment Plan Summary: Bipolar disord, crnt epsd mixed, severe, w/o psych features ([place]) unstable, warrants inpatient management, treated as below:  Medications: -Tegretol 400 daily for mood stabilization   -Thorazine 73m injection x1  -Celexa 244mpo daily for depression -Zyprexa 1037mo qhs for insomnia and q8h prn agitation  Disposition: Recommend psychiatric Inpatient admission when medically cleared.  WitBenjamine MolaNPNorth Carolina25/2018 11:31 AM Patient seen face-to-face for psychiatric evaluation, chart reviewed and case discussed with the physician extender and developed treatment plan. Reviewed the information documented and agree with the treatment plan. MojCorena PilgrimD

## 2017-01-03 NOTE — ED Notes (Signed)
Pt becoming increasingly agitated. Refusing PRN Zyprexa. This nurse notified Dr. Mervyn SkeetersA.

## 2017-01-04 NOTE — BH Assessment (Signed)
TTS Reassessment 01/04/2017:  Patient presented to [facility] on 01/02/2017. Patient reportedly off her medications and combative described as a manic state. Writer met with patient face to face. 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 Patient with flight of ideas and pressured speech. She is belligerent and aggressive in her tone. Patient upset that she is made to stay in the hospital. Sts that she was recently discharged from [facility]. Patient stating that she must be discharged soon as she plans to attend the [place] in July. Patient pointing her fingers in this writers face emphasizing that she will sue if not discharged soon. Patient denies suicidal and homicidal ideations. She denies AVH's. Appetite and sleep are fair. UDS and BAL are both negative. Per Dr. Darleene Cleaver, patient to remain in the ED for INPT treatment. TTS to seek placement.

## 2017-01-04 NOTE — ED Notes (Signed)
Pt refused nighttime med.

## 2017-01-04 NOTE — ED Notes (Signed)
Pt is calm and cooperative today.  She took her medication without incident.  She still appears delusional thinking that her sister is the one with a psychiatric diagnosis and she is a homeless veteran [place].

## 2017-01-04 NOTE — BH Assessment (Signed)
[facility] Assessment Progress Note  Per Thedore MinsMojeed Akintayo, MD, this pt continues to require psychiatric hospitalization at this time.  The following facilities have been contacted to seek placement for this pt, with results as noted:  Beds available, information sent, decision pending:  [facility]   Declined:  [facility] (due to pt acuity)   At capacity:  [facility]   Inez Rosato, [place] Triage Specialist [phone]

## 2017-01-04 NOTE — ED Notes (Signed)
Pt sleeping at present, no distress noted, calm & cooperative at present.  Monitoring for safety, Q 15 min checks in effect. 

## 2017-01-04 NOTE — Progress Notes (Signed)
01/04/17 1342:  LRT went to pt room to offer activities, pt was sleep.  Everton Bertha, LRT/CTRS 

## 2017-01-05 ENCOUNTER — Inpatient Hospital Stay (HOSPITAL_COMMUNITY)
Admission: AD | Admit: 2017-01-05 | Discharge: 2017-01-14 | DRG: 885 | Disposition: A | Discharge status: Home / Self Care | Payer: 59 | Attending: Psychiatry | Admitting: Psychiatry

## 2017-01-05 ENCOUNTER — Encounter (HOSPITAL_COMMUNITY): Payer: Self-pay | Admitting: General Practice

## 2017-01-05 DIAGNOSIS — F411 Generalized anxiety disorder: Secondary | ICD-10-CM | POA: Diagnosis present

## 2017-01-05 DIAGNOSIS — F3163 Bipolar disorder, current episode mixed, severe, without psychotic features: Secondary | ICD-10-CM | POA: Diagnosis present

## 2017-01-05 DIAGNOSIS — F3110 Bipolar disorder, current episode manic without psychotic features, unspecified: Secondary | ICD-10-CM | POA: Diagnosis not present

## 2017-01-05 DIAGNOSIS — G4700 Insomnia, unspecified: Secondary | ICD-10-CM | POA: Diagnosis present

## 2017-01-05 DIAGNOSIS — F309 Manic episode, unspecified: Secondary | ICD-10-CM | POA: Diagnosis not present

## 2017-01-05 DIAGNOSIS — F39 Unspecified mood [affective] disorder: Secondary | ICD-10-CM | POA: Diagnosis not present

## 2017-01-05 DIAGNOSIS — F3010 Manic episode without psychotic symptoms, unspecified: Secondary | ICD-10-CM | POA: Insufficient documentation

## 2017-01-05 DIAGNOSIS — F319 Bipolar disorder, unspecified: Secondary | ICD-10-CM | POA: Diagnosis present

## 2017-01-05 DIAGNOSIS — F17203 Nicotine dependence unspecified, with withdrawal: Secondary | ICD-10-CM | POA: Diagnosis present

## 2017-01-05 DIAGNOSIS — Z833 Family history of diabetes mellitus: Secondary | ICD-10-CM | POA: Diagnosis not present

## 2017-01-05 DIAGNOSIS — Z8249 Family history of ischemic heart disease and other diseases of the circulatory system: Secondary | ICD-10-CM | POA: Diagnosis not present

## 2017-01-05 DIAGNOSIS — Z818 Family history of other mental and behavioral disorders: Secondary | ICD-10-CM | POA: Diagnosis not present

## 2017-01-05 DIAGNOSIS — Z79899 Other long term (current) drug therapy: Secondary | ICD-10-CM | POA: Diagnosis not present

## 2017-01-05 DIAGNOSIS — Z590 Homelessness: Secondary | ICD-10-CM | POA: Diagnosis not present

## 2017-01-05 DIAGNOSIS — Z046 Encounter for general psychiatric examination, requested by authority: Secondary | ICD-10-CM | POA: Insufficient documentation

## 2017-01-05 DIAGNOSIS — R451 Restlessness and agitation: Secondary | ICD-10-CM | POA: Diagnosis not present

## 2017-01-05 MED ORDER — CITALOPRAM HYDROBROMIDE 20 MG PO TABS
20.0000 mg | ORAL_TABLET | Freq: Every day | ORAL | Status: DC
  Administered 2017-01-06: 20 mg via ORAL
  Filled 2017-01-05 (×3): qty 1

## 2017-01-05 MED ORDER — MAGNESIUM HYDROXIDE 400 MG/5ML PO SUSP: 30.0000 mL | Freq: Every day | ORAL | Status: DC | PRN

## 2017-01-05 MED ORDER — OLANZAPINE 10 MG PO TBDP
10.0000 mg | ORAL_TABLET | Freq: Every day | ORAL | Status: DC
  Filled 2017-01-05 (×7): qty 1

## 2017-01-05 MED ORDER — TRAZODONE HCL 50 MG PO TABS
50.0000 mg | ORAL_TABLET | Freq: Every evening | ORAL | Status: DC | PRN
  Filled 2017-01-05 (×2): qty 1

## 2017-01-05 MED ORDER — ALUM & MAG HYDROXIDE-SIMETH 200-200-20 MG/5ML PO SUSP: 30.0000 mL | ORAL | Status: DC | PRN

## 2017-01-05 MED ORDER — CARBAMAZEPINE ER 400 MG PO TB12
400.0000 mg | ORAL_TABLET | Freq: Every day | ORAL | Status: DC
  Administered 2017-01-07 – 2017-01-12 (×6): 400 mg via ORAL
  Filled 2017-01-05 (×9): qty 1

## 2017-01-05 MED ORDER — ACETAMINOPHEN 325 MG PO TABS: 650.0000 mg | ORAL_TABLET | Freq: Four times a day (QID) | ORAL | Status: DC | PRN

## 2017-01-05 MED ORDER — HYDROXYZINE HCL 25 MG PO TABS: 25.0000 mg | ORAL_TABLET | Freq: Four times a day (QID) | ORAL | Status: DC | PRN

## 2017-01-05 MED ORDER — OLANZAPINE 10 MG PO TBDP: 10.0000 mg | ORAL_TABLET | Freq: Three times a day (TID) | ORAL | Status: DC | PRN

## 2017-01-05 NOTE — Consult Note (Signed)
[facility] Consult   Reason for Consult:  Manic behavior Referring Physician:  EDP Patient Identification: Yvonne BalsamMelvine [name] MRN:  [id number] Principal Diagnosis: Bipolar disord, crnt epsd mixed, severe, w/o psych features [facility]CC) Diagnosis:   Patient Active Problem List   Diagnosis Date Noted  . Involuntary commitment [Z04.6]   . Manic behavior (HCC) [F30.10]   . Facial cellulitis [L03.211] 02/10/2014  . Bipolar disord, crnt epsd mixed, severe, w/o psych features (HCC) [F31.63] 04/25/2013  . Bipolar disorder, unspecified (HCC) [F31.9] 01/02/2013  . MDD (major depressive disorder) [F32.9] 08/02/2012    Total Time spent with patient: 30 minutes  Subjective:   Yvonne [name] is a 51 y.o. female patient admitted with manic behavior.  HPI:  Yvonne [name] is a 51 year old female who [facility], under IVC, because she was combative and manic.  Pt has been off of her psychiatric medications Pt denies suicidal/homicidal ideation, denies auditory/visual hallucinations and does not appear to be responding to internal stimuli. Pt is cooperative but still exhibiting manic tendencies. Pt stated she is homeless and wants to stay at the hospital until we find her a shelter and in the next breath states "I need to get to [place] in [place]." Pt stated she was staying at her sister's house but then her sister told her she had to leave and called the police on her. Pt stated none of her family will let her stay with them. Pt also states "I am a homeless Veteran." Pt would benefit from inpatient psychiatric admission for crisis stabilization and medication management.   Past Psychiatric History: MDD, Bipolar Disorder, Manic behavior,   Risk to Self:None Risk to Others: None Prior Inpatient Therapy: Prior Inpatient Therapy: Yes Prior Therapy Dates: 2014 [facility] Reason for Treatment: Manic Prior Outpatient Therapy: Prior Outpatient Therapy:   (UTA) Prior Therapy Dates: N/A Prior Therapy Facilty/Provider(s): N/A Reason for Treatment: N/A Does patient have an ACCT team?: Unknown Does patient have Intensive In-House Services?  : No Does patient have [facility] services? : No Does patient have P4CC services?: No  Past Medical History:  Past Medical History:  Diagnosis Date  . Bipolar 1 disorder (HCC)   . Medical history non-contributory   . Mental disorder   . No pertinent past medical history     Past Surgical History:  Procedure Laterality Date  . NO PAST SURGERIES     Family History:  Family History  Problem Relation Age of Onset  . Depression Sister   . Depression Brother   . Depression Sister   . CAD Mother   . Hypertension Father   . Diabetes Father   . Bipolar disorder Cousin    Family Psychiatric  History: Unknown Social History:  History  Alcohol Use No     History  Drug Use No    Social History   Social History  . Marital status: Single    Spouse name: N/A  . Number of children: N/A  . Years of education: N/A   Social History Main Topics  . Smoking status: Never Smoker  . Smokeless tobacco: Never Used  . Alcohol use No  . Drug use: No  . Sexual activity: No   Other Topics Concern  . None   Social History Narrative  . None   Additional Social History:    Allergies:  No Known Allergies  Labs: No results found for this or any previous visit (from the past 48 hour(s)).  Current Facility-Administered  Medications  Medication Dose Route Frequency Provider Last Rate Last Dose  . acetaminophen (TYLENOL) tablet 650 mg  650 mg Oral Q6H PRN Laveda Abbe, NP   650 mg at 01/03/17 1812  . carbamazepine (TEGRETOL XR) 12 hr tablet 400 mg  400 mg Oral Daily Decoda Van, MD   400 mg at 01/05/17 0930  . citalopram (CELEXA) tablet 20 mg  20 mg Oral Daily Ryenne Lynam, MD   20 mg at 01/05/17 0930  . lip balm (CARMEX) ointment   Topical Once Derwood Kaplan, MD   Stopped at 01/02/17  0220  . OLANZapine zydis (ZYPREXA) disintegrating tablet 10 mg  10 mg Oral Q8H PRN Shuaib Corsino, MD      . OLANZapine zydis (ZYPREXA) disintegrating tablet 10 mg  10 mg Oral QHS Thedore Mins, MD   Stopped at 01/03/17 2211   Current Outpatient Prescriptions  Medication Sig Dispense Refill  . carbamazepine (TEGRETOL XR) 400 MG 12 hr tablet Take 1 tablet (400 mg total) by mouth daily. (Patient not taking: Reported on 01/02/2017) 60 tablet 1  . citalopram (CELEXA) 40 MG tablet Take 1 tablet (40 mg total) by mouth daily. (Patient not taking: Reported on 01/02/2017) 30 tablet 2    Musculoskeletal: Strength & Muscle Tone: within normal limits Gait & Station: normal Patient leans: N/A  Psychiatric Specialty Exam: Physical Exam  Constitutional: She appears well-developed and well-nourished.  HENT:  Head: Normocephalic.  Cardiovascular: Normal rate.   Respiratory: Effort normal.  Musculoskeletal: Normal range of motion.  Neurological: She is alert.    Review of Systems  Psychiatric/Behavioral: Positive for depression. The patient is nervous/anxious.   All other systems reviewed and are negative.   Blood pressure 136/88, pulse 100, temperature 98.9 F (37.2 C), temperature source Oral, resp. rate 16, SpO2 100 %.There is no height or weight on file to calculate BMI.  General Appearance: Casual  Eye Contact:  Good  Speech:  Pressured  Volume:  Increased  Mood:  Anxious and Irritable  Affect:  Congruent and Labile  Thought Process:  Coherent and Linear  Orientation:  Full (Time, Place, and Person)  Thought Content:  Illogical  Suicidal Thoughts:  No  Homicidal Thoughts:  No  Memory:  Immediate;   Good Recent;   Fair Remote;   Fair  Judgement:  Poor  Insight:  Fair  Psychomotor Activity:  Increased  Concentration:  Concentration: Fair and Attention Span: Fair  Recall:  Good  Fund of Knowledge:  Good  Language:  Good  Akathisia:  No  Handed:  Right  AIMS (if indicated):      Assets:  Architect Resilience  ADL's:  Intact  Cognition:  WNL  Sleep:        Treatment Plan Summary: Daily contact with patient to assess and evaluate symptoms and progress in treatment, Medication management and Plan Bipolar Disorder, mixed, current episode manic  -Crisis Stabilization -Continue currently prescribed medications (See MAR)  Disposition: Recommend psychiatric Inpatient admission when medically cleared.  Laveda Abbe, NP 01/05/2017 2:50 PM  Patient seen face-to-face for psychiatric evaluation, chart reviewed and case discussed with the physician extender and developed treatment plan. Reviewed the information documented and agree with the treatment plan. Thedore Mins, MD

## 2017-01-05 NOTE — Progress Notes (Signed)
Patient ID: Yvonne BalsamMelvine [name], female   DOB: [birth year], 51 y.o.   MRN: [id number]   Pt refuses urine pregnancy screening, reports that she "has not been with a man in two years." Keeps reiterating the fact that she is "51 years old" to this Clinical research associatewriter.
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
## 2017-01-05 NOTE — Progress Notes (Signed)
01/05/17 1354:  LRT went to pt room to offer activities, pt was sleep.  Caroll RancherMarjette Rick Carruthers, LRT/CTRS

## 2017-01-05 NOTE — Tx Team (Signed)
Initial Treatment Plan 01/05/2017 6:38 PM Yvonne [name] [id number]:[id number]    PATIENT STRESSORS: Marital or family conflict   PATIENT STRENGTHS: Physical Health Religious Affiliation   PATIENT IDENTIFIED PROBLEMS: "My sister is losing her mind, her husband is dying"    "I am a homeless veteran, I'm leaving tomorrow."    Psychosis, Mania     Homelessness     Poor social supports      DISCHARGE CRITERIA:  Improved stabilization in mood, thinking, and/or behavior Motivation to continue treatment in a less acute level of care Safe-care adequate arrangements made Verbal commitment to aftercare and medication compliance  PRELIMINARY DISCHARGE PLAN: Outpatient therapy Placement in alternative living arrangements  PATIENT/FAMILY INVOLVEMENT: This treatment plan has been presented to and reviewed with the patient, Yvonne [name].  The patient and family have been given the opportunity to ask questions and make suggestions.  Larina EarthlyDopson, Nevaya Nagele E, RN 01/05/2017, 6:38 PM

## 2017-01-05 NOTE — Progress Notes (Signed)
Patient ID: Yvonne BalsamMelvine [name], female   DOB: 1966/01/17, 51 y.o.   MRN: [id number]  Pt currently presents with an animated affect and anxious behavior. Speech is loud, rapid, pressured and tangential. Pt reports to writer that their goal is to "find somewhere to go that is not a shelter, they have to put me in a hotel if they make me leave." Pt has delusions of grandeur, states "I am better than Oprah, the queen of business. I will be building a house like the ones [place], [place]." Pt reports good sleep currently, refuses to take nighttime medications. Requests to stay on Tegretol as she is "only here because my sisters husband has terminal cancer and they kicked me out."  Pt provided with medications per providers orders. Pt's labs and vitals were monitored throughout the night. Pt given a 1:1 about emotional and mental status. Pt supported and encouraged to express concerns and questions. Writer attempted to educate patient on medications, refused teaching. Encouraged to speak with MD about medication concerns tomorrow.   Pt's safety ensured with 15 minute and environmental checks. Pt currently denies SI/HI and A/V hallucinations. Pt verbally agrees to seek staff if SI/HI or A/VH occurs and to consult with staff before acting on any harmful thoughts. Pt has periods of logical thought and then intermittent delusional tangents. Pt is concerned about housing post discharge and reports isolation as many of her family members have passed. Reports "maybe it's a good thing I got kicked out of the house everyone died in." ill continue POC.

## 2017-01-06 DIAGNOSIS — F39 Unspecified mood [affective] disorder: Secondary | ICD-10-CM

## 2017-01-06 DIAGNOSIS — F3110 Bipolar disorder, current episode manic without psychotic features, unspecified: Secondary | ICD-10-CM

## 2017-01-06 DIAGNOSIS — R451 Restlessness and agitation: Secondary | ICD-10-CM

## 2017-01-06 DIAGNOSIS — G4700 Insomnia, unspecified: Secondary | ICD-10-CM

## 2017-01-06 DIAGNOSIS — Z818 Family history of other mental and behavioral disorders: Secondary | ICD-10-CM

## 2017-01-06 MED ORDER — LORAZEPAM 2 MG/ML IJ SOLN
1.0000 mg | Freq: Four times a day (QID) | INTRAMUSCULAR | Status: DC | PRN
  Administered 2017-01-10: 1 mg via INTRAMUSCULAR
  Filled 2017-01-06 (×3): qty 1

## 2017-01-06 MED ORDER — HALOPERIDOL 5 MG PO TABS
5.0000 mg | ORAL_TABLET | Freq: Three times a day (TID) | ORAL | Status: DC | PRN
  Administered 2017-01-06: 5 mg via ORAL
  Filled 2017-01-06 (×3): qty 1

## 2017-01-06 MED ORDER — HALOPERIDOL LACTATE 5 MG/ML IJ SOLN
5.0000 mg | Freq: Three times a day (TID) | INTRAMUSCULAR | Status: DC | PRN
  Administered 2017-01-10: 5 mg via INTRAMUSCULAR
  Filled 2017-01-06 (×2): qty 1

## 2017-01-06 MED ORDER — LORAZEPAM 1 MG PO TABS
1.0000 mg | ORAL_TABLET | Freq: Four times a day (QID) | ORAL | Status: DC | PRN
  Administered 2017-01-06 – 2017-01-13 (×2): 1 mg via ORAL
  Filled 2017-01-06 (×4): qty 1

## 2017-01-06 MED ORDER — ENSURE ENLIVE PO LIQD: 237.0000 mL | Freq: Two times a day (BID) | ORAL | Status: DC
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
## 2017-01-06 NOTE — Progress Notes (Signed)
Recreation Therapy Notes  01/06/17 0915:  LRT introduced self to pt and attempted to do assessment.  Pt stated "I don't need to talk to you because I'm a homeless vet and  leaving today.  So, I don't need a therapist.  I'm not in the hospital like the rest of them".  Pt refused assessment.   Caroll RancherMarjette Olliver Boyadjian, LRT/CTRS     Caroll RancherLindsay, Yaneth Fairbairn A 01/06/2017 12:04 PM

## 2017-01-06 NOTE — Progress Notes (Signed)
D: Visible in milieu with peers. Denies SI, HI, AVH and pain. Presents with blunted affect and irritable mood. Speech is loud and pressured. Pt's mood is labile, requires multiple verbal redirections this AM. Delusional in reference to hospitalization "I'am a homeless vet and I'm just here because I need somewhere to stay, I'm not crazy so don't treat me like that". Verbally agitated about MD changing her AM Tegretol dose from 200 mg to 400 mg PO. "I'm not taking that medication, my doctor is Dr. Jannifer FranklinAkintayo and he prescribed me 200 mg not 400 mg". A: Medications offered to pt as prescribed. Emotional support and availability provided to pt. Close observation maintained without self harm gestures. Assigned staff in attendance at all time.  R: Pt only took her scheduled Celexa and declined her Tegretol "I want to talk to the doctor about my medicine". Attended unit groups and was dominating in group. Remains safe on unit.

## 2017-01-06 NOTE — Progress Notes (Signed)
Recreation Therapy Notes  Date: 01/06/17 Time: 1000 Location: 500 Hall Dayroom  Group Topic: Communication, Team Building, Problem Solving  Goal Area(s) Addresses:  Patient will effectively work with peer towards shared goal.  Patient will identify skill used to make activity successful.  Patient will identify how skills used during activity can be used to reach post d/c goals.   Behavioral Response: Minimal  Intervention: STEM Activity   Activity: [employer] Tower. In teams, patients were asked to build the tallest freestanding tower possible out of 15 pipe cleaners. Systematically resources were removed, for example patient ability to use both hands and patient ability to verbally communicate.    Education: Pharmacist, communityocial Skills, Building control surveyorDischarge Planning.   Education Outcome: Acknowledges education/In group clarification offered/Needs additional education.   Clinical Observations/Feedback: Pt was hyper verbal throughout group.  Pt needed constant redirection during group.  Pt did manage to give advise to her team on how to build the tower.  Pt stated the group had to obey the rules.  Pt stated "if you can obey the rules here, you can obey them outside of here".     Caroll RancherMarjette Kaegan [name], LRT/CTRS         Caroll RancherLindsay, Tamyrah Burbage A 01/06/2017 11:27 AM

## 2017-01-06 NOTE — Progress Notes (Signed)
Close Observation (CO) Note    Pt at this time is in bed resting. Pt refused all 2200 medications; "you guys are trying to kill me; I am a veteran and I am homeless." Pt does not look to be in any distress at this time. CO staff is present in room with Pt at this time. CO monitoring continues for Pt's safety. 15-minute safety checks also continues at this time.

## 2017-01-06 NOTE — H&P (Signed)
Psychiatric Admission Assessment Adult  Patient Identification: Yvonne [name] MRN:  [id number] Date of Evaluation:  01/06/2017 Chief Complaint:  BIPOLAR DISORDER Principal Diagnosis: <principal problem not specified> Diagnosis:   Patient Active Problem List   Diagnosis Date Noted  . Bipolar affective disorder, current episode manic without psychotic symptoms (HCC) [F31.10] 01/05/2017  . Involuntary commitment [Z04.6]   . Manic behavior (HCC) [F30.10]   . Facial cellulitis [L03.211] 02/10/2014  . Bipolar disord, crnt epsd mixed, severe, w/o psych features (HCC) [F31.63] 04/25/2013  . Bipolar disorder, unspecified (HCC) [F31.9] 01/02/2013  . MDD (major depressive disorder) [F32.9] 08/02/2012   History of Present Illness: Yvonne [name] is an 51 y.o. female, Admitted involuntarily emergently from [facility] long emergency department for increased dangerous, disruptive and combative behaviors as per [employer]. Patient presented with severe symptoms of bipolar mania, irritability, agitation, aggressive behavior, delusional thoughts, disorganized, intrusive and impulsive. Patient is also very poor historian, repeats herself several times saying that she is a veteran, homeless and cannot be placed on the streets and she does not need to see in hospital she needed to be put in a motel by CSW.  Patient stated that Dr. Isidoro Donning was treated her in the past and he knows everything about her family and he told her she is coming to the hospital to be discharged today. Patient stated she cannot stay here because she has a multiple plans of going to [place] and also out-of-state travel but does not give any specific way of transportation and who she is going to travel etc. Patient reported she used to do shopping sprees and coming out of control. Patient stated that she learned about another patient in the unit who cannot stay by herself without tenderness support so she is planning to move in with her so that she will have a place  of her own. Reportedly patient was at sister's home and has a bizarre behaviors that urinating in the street and refusing to leave assistance home. Patient was recently admitted to [facility] on 12/01/2016 which she stayed about 10 days. Patient continued to have pressured speech, racing thoughts, tangential and circumstantial and aggressive body language. Patient has a limited insight, judgment and impulse controls.  Associated Signs/Symptoms: Depression Symptoms:  insomnia, psychomotor agitation, (Hypo) Manic Symptoms:  Delusions, Distractibility, Elevated Mood, Flight of Ideas, Licensed conveyancer, Impulsivity, Irritable Mood, Labiality of Mood, Anxiety Symptoms:  denied Psychotic Symptoms:  Delusions, PTSD Symptoms: Negative Total Time spent with patient: 1 hour  Past Psychiatric History: Patient has been diagnosed with bipolar disorder, and has multiple acute psychiatric hospitalization no previous hospitalization at behavioral health's is 04/24/2013.   Is the patient at risk to self? Yes.    Has the patient been a risk to self in the past 6 months? Yes.    Has the patient been a risk to self within the distant past? No.  Is the patient a risk to others? No.  Has the patient been a risk to others in the past 6 months? No.  Has the patient been a risk to others within the distant past? No.   Prior Inpatient Therapy:   Prior Outpatient Therapy:    Alcohol Screening: 1. How often do you have a drink containing alcohol?: Never 9. Have you or someone else been injured as a result of your drinking?: No 10. Has a relative or friend or a doctor or another health worker been concerned about your drinking or suggested you cut down?: No Alcohol Use Disorder Identification  Test Final Score (AUDIT): 0 Brief Intervention: AUDIT score less than 7 or less-screening does not suggest unhealthy drinking-brief intervention not indicated Substance Abuse History in the last 12 months:   No. Consequences of Substance Abuse: NA Previous Psychotropic Medications: Yes  Psychological Evaluations: Yes  Past Medical History:  Past Medical History:  Diagnosis Date  . Bipolar 1 disorder (HCC)   . Medical history non-contributory   . Mental disorder   . No pertinent past medical history     Past Surgical History:  Procedure Laterality Date  . NO PAST SURGERIES     Family History:  Family History  Problem Relation Age of Onset  . Depression Sister   . Depression Brother   . Depression Sister   . CAD Mother   . Hypertension Father   . Diabetes Father   . Bipolar disorder Cousin    Family Psychiatric  History: unknown. Tobacco Screening: Have you used any form of tobacco in the last 30 days? (Cigarettes, Smokeless Tobacco, Cigars, and/or Pipes): No Social History:  History  Alcohol Use No     History  Drug Use No    Additional Social History:      Allergies:  No Known Allergies Lab Results: No results found for this or any previous visit (from the past 48 hour(s)).  Blood Alcohol level:  Lab Results  Component Value Date   [facility] <5 01/02/2017   ETH <5 11/24/2016    Metabolic Disorder Labs:  Lab Results  Component Value Date   HGBA1C 5.5 06/21/2012   MPG 111 06/21/2012   No results found for: PROLACTIN Lab Results  Component Value Date   CHOL 166 06/21/2012   TRIG 58 06/21/2012   HDL 58 06/21/2012   CHOLHDL 2.9 06/21/2012   VLDL 12 06/21/2012   LDLCALC 96 06/21/2012    Current Medications: Current Facility-Administered Medications  Medication Dose Route Frequency Provider Last Rate Last Dose  . acetaminophen (TYLENOL) tablet 650 mg  650 mg Oral Q6H PRN Laveda Abbe, NP      . alum & mag hydroxide-simeth (MAALOX/MYLANTA) 200-200-20 MG/5ML suspension 30 mL  30 mL Oral Q4H PRN Laveda Abbe, NP      . carbamazepine (TEGRETOL XR) 12 hr tablet 400 mg  400 mg Oral Daily Laveda Abbe, NP      . citalopram (CELEXA) tablet 20  mg  20 mg Oral Daily Laveda Abbe, NP   20 mg at 01/06/17 1610  . feeding supplement (ENSURE ENLIVE) (ENSURE ENLIVE) liquid 237 mL  237 mL Oral BID BM Eappen, Saramma, MD      . hydrOXYzine (ATARAX/VISTARIL) tablet 25 mg  25 mg Oral Q6H PRN Laveda Abbe, NP      . magnesium hydroxide (MILK OF MAGNESIA) suspension 30 mL  30 mL Oral Daily PRN Laveda Abbe, NP      . OLANZapine zydis (ZYPREXA) disintegrating tablet 10 mg  10 mg Oral Q8H PRN Laveda Abbe, NP      . OLANZapine zydis (ZYPREXA) disintegrating tablet 10 mg  10 mg Oral QHS Laveda Abbe, NP      . traZODone (DESYREL) tablet 50 mg  50 mg Oral QHS PRN Laveda Abbe, NP       PTA Medications: Prescriptions Prior to Admission  Medication Sig Dispense Refill Last Dose  . carbamazepine (TEGRETOL XR) 400 MG 12 hr tablet Take 1 tablet (400 mg total) by mouth daily. (Patient not taking: Reported on 01/02/2017)  60 tablet 1 Not Taking at Unknown time  . citalopram (CELEXA) 40 MG tablet Take 1 tablet (40 mg total) by mouth daily. (Patient not taking: Reported on 01/02/2017) 30 tablet 2 Not Taking at Unknown time    Musculoskeletal: Strength & Muscle Tone: within normal limits Gait & Station: normal Patient leans: N/A  Psychiatric Specialty Exam: Physical Exam Full physical performed in Emergency Department. I have reviewed this assessment and concur with its findings.   ROS presented with multiple symptoms of mania, poor insight judgment and impulse control. Patient has denied nausea, vomiting, abdominal pain, shortness of breath and chest pain. No Fever-chills, No Headache, No changes with Vision or hearing, reports vertigo No problems swallowing food or Liquids, No Chest pain, Cough or Shortness of Breath, No Abdominal pain, No Nausea or Vommitting, Bowel movements are regular, No Blood in stool or Urine, No dysuria, No new skin rashes or bruises, No new joints pains-aches,  No new  weakness, tingling, numbness in any extremity, No recent weight gain or loss, No polyuria, polydypsia or polyphagia,  A full 10 point Review of Systems was done, except as stated above, all other Review of Systems were negative.  Blood pressure (!) 122/99, pulse (!) 122, temperature 99 F (37.2 C), temperature source Oral, resp. rate 18, height 5' 0.75" (1.543 m), weight 51.3 kg (113 lb).Body mass index is 21.53 kg/m.  General Appearance: Bizarre, Disheveled and Guarded  Eye Contact:  Good  Speech:  Pressured  Volume:  Increased  Mood:  Euphoric and Irritable  Affect:  Labile  Thought Process:  Coherent and Disorganized  Orientation:  Full (Time, Place, and Person)  Thought Content:  Rumination and Tangential  Suicidal Thoughts:  No  Homicidal Thoughts:  No  Memory:  Immediate;   Good Recent;   Fair Remote;   Fair  Judgement:  Impaired  Insight:  Fair  Psychomotor Activity:  Increased and Restlessness  Concentration:  Concentration: Fair and Attention Span: Poor  Recall:  [employer] of Knowledge:  Good  Language:  Good  Akathisia:  Negative  Handed:  Right  AIMS (if indicated):     Assets:  Communication Skills Desire for Improvement Financial Resources/Insurance Leisure Time Physical Health Resilience Social Support  ADL's:  Impaired  Cognition:  WNL  Sleep:  Number of Hours: 6.25    Treatment Plan Summary: Daily contact with patient to assess and evaluate symptoms and progress in treatment and Medication management  Observation Level/Precautions:  15 minute checks  Laboratory:  reviewed admission labs.  Psychotherapy:  Milieu therapy and group therapies   Medications:  Tegretol-XR 400 mg daily for mood swings, citalopram 20 mg daily for depression, hydroxyzine 25 mg every 6 hours as needed for anxiety, olanzapine Zaidi's 10 mg every 8 hours as needed for agitation and at bedtime regularly and trazodone 50 mg at bedtime as needed for insomnia   Consultations:  As  needed   Discharge Concerns:  Safety and symptom control   Estimated LOS:5-7 days   Other:     Physician Treatment Plan for Primary Diagnosis: <principal problem not specified> Long Term Goal(s): Improvement in symptoms so as ready for discharge  Short Term Goals: Ability to identify changes in lifestyle to reduce recurrence of condition will improve, Ability to verbalize feelings will improve, Ability to disclose and discuss suicidal ideas and Ability to demonstrate self-control will improve  Physician Treatment Plan for Secondary Diagnosis: Active Problems:   Bipolar affective disorder, current episode manic without psychotic symptoms (  HCC)  Long Term Goal(s): Improvement in symptoms so as ready for discharge  Short Term Goals: Ability to identify and develop effective coping behaviors will improve, Ability to maintain clinical measurements within normal limits will improve, Compliance with prescribed medications will improve and Ability to identify triggers associated with substance abuse/mental health issues will improve  I certify that inpatient services furnished can reasonably be expected to improve the patient's condition.    Leata MouseJANARDHANA Cassius Cullinane, MD 6/28/201810:20 AM
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
## 2017-01-06 NOTE — Progress Notes (Signed)
D: Pt has been disruptive in milieu. Loud, hyper-verbal, intrusive towards peers and restless. Continue to refuse to take scheduled or PRN medications. Still demanding d/c today. Grandiose on interaction "I'm taking them to the [employer] (female peers), they agreed to pay me $300.00 each, I'm here because Dr. Jannifer FranklinAkintayo told me to stay here till Tuesday because I'm a homeless vet, I have my suite paid for and I'm leaving on next Saturday". A: Continues support and encouragement provided to pt. PRN and scheduled medications offered but to no avail. Dr. Jama Flavorsobos made aware of pt's behavior and plans to reassess pt. Close observation continues with assigned staff in attendance at all times. R: Pt's mood remains labile. Pt not redirectable at this time.

## 2017-01-06 NOTE — [facility] Group Notes (Signed)
[facility]  01/06/2017 , 1:20 PM    Type of Therapy:  [facility] Presentation  Participation Level:  Active  Participation Quality:  Attentive  Affect:  Blunted  Cognitive:  Oriented  Insight:  Limited  Engagement in Therapy:  Engaged  Modes of Intervention:  Discussion, Education and Socialization  Summary of Progress/Problems:  Onalee HuaDavid from [facility] came to present his recovery story and play the guitar.  Was warned ahead of time she could only stay if she allowed the speaker to speak and not interrupt.  She agreed.  At the end, she shared that she is done with recovery. She does not need meds. She will be here until Tuesday, at which point she will go live with another patient.  Daryel Geraldorth, Lenord Fralix B 01/06/2017 , 1:20 PM

## 2017-01-06 NOTE — Progress Notes (Signed)
NUTRITION ASSESSMENT  Pt identified as at risk on the Malnutrition Screen Tool  INTERVENTION: 1. Supplements: Ensure Enlive po BID, each supplement provides 350 kcal and 20 grams of protein  NUTRITION DIAGNOSIS: Unintentional weight loss related to sub-optimal intake as evidenced by pt report.   Goal: Pt to meet >/= 90% of their estimated nutrition needs.  Monitor:  PO intake  Assessment:  Pt admitted with bipolar disorder and manic behavior. Pt was in jail for a month from April to May this year, states she lost 40 lb during that time. Per chart review, pt has lost 27 lb since 5/21 (19% wt loss x 1 month, significant for time frame).  Pt would benefit from nutritional supplementation, will order Ensure supplements.  Height: Ht Readings from Last 1 Encounters:  01/05/17 5' 0.75" (1.543 m)    Weight: Wt Readings from Last 1 Encounters:  01/05/17 113 lb (51.3 kg)    Weight Hx: Wt Readings from Last 10 Encounters:  01/05/17 113 lb (51.3 kg)  11/29/16 140 lb (63.5 kg)  01/08/16 144 lb (65.3 kg)  09/23/15 148 lb (67.1 kg)  11/16/14 157 lb (71.2 kg)  02/10/14 139 lb (63 kg)  02/08/14 142 lb 6.4 oz (64.6 kg)  12/25/13 137 lb 6.4 oz (62.3 kg)  04/25/13 133 lb (60.3 kg)  01/29/13 126 lb (57.2 kg)    BMI:  Body mass index is 21.53 kg/m. Pt meets criteria for normal based on current BMI.  Estimated Nutritional Needs: Kcal: 25-30 kcal/kg Protein: > 1 gram protein/kg Fluid: 1 ml/kcal  Diet Order: Diet regular Room service appropriate? No; Fluid consistency: Thin Pt is also offered choice of unit snacks mid-morning and mid-afternoon.  Pt is eating as desired.   Lab results and medications reviewed.   Yvonne FrancoLindsey Jatziri Goffredo, MS, RD, LDN Pager: [phone] After Hours Pager: [phone]

## 2017-01-06 NOTE — [facility] Suicide Risk Assessment (Signed)
[facility]   Nursing information obtained from:  Patient Demographic factors:  Divorced or widowed Current Mental Status:  NA Loss Factors:  Loss of significant relationship Historical Factors:  Family history of mental illness or substance abuse, Impulsivity Risk Reduction Factors:  Employed, Religious beliefs about death  Total Time spent with patient: 1 hour Principal Problem: <principal problem not specified> Diagnosis:   Patient Active Problem List   Diagnosis Date Noted  . Bipolar affective disorder, current episode manic without psychotic symptoms (HCC) [F31.10] 01/05/2017  . Involuntary commitment [Z04.6]   . Manic behavior (HCC) [F30.10]   . Facial cellulitis [L03.211] 02/10/2014  . Bipolar disord, crnt epsd mixed, severe, w/o psych features (HCC) [F31.63] 04/25/2013  . Bipolar disorder, unspecified (HCC) [F31.9] 01/02/2013  . MDD (major depressive disorder) [F32.9] 08/02/2012   Subjective Data: Patient is a 51 years old female   Continued Clinical Symptoms:  Alcohol Use Disorder Identification Test Final Score (AUDIT): 0 The "Alcohol Use Disorders Identification Test", Guidelines for Use in Primary Care, Second Edition.  World Science writerHealth Organization [facility](WHO). Score between 0-7:  no or low risk or alcohol related problems. Score between 8-15:  moderate risk of alcohol related problems. Score between 16-19:  high risk of alcohol related problems. Score 20 or above:  warrants further diagnostic evaluation for alcohol dependence and treatment.   CLINICAL FACTORS:   Severe Anxiety and/or Agitation Bipolar Disorder:   Mixed State More than one psychiatric diagnosis Currently Psychotic Unstable or Poor Therapeutic Relationship Previous Psychiatric Diagnoses and Treatments    Psychiatric Specialty Exam: Physical Exam  ROS  Blood pressure (!) 122/99, pulse (!) 122, temperature 99 F (37.2 C), temperature source Oral, resp. rate 18, height 5' 0.75"  (1.543 m), weight 51.3 kg (113 lb).Body mass index is 21.53 kg/m.     COGNITIVE FEATURES THAT CONTRIBUTE TO RISK:  Closed-mindedness, Loss of executive function, Polarized thinking and Thought constriction (tunnel vision)    SUICIDE RISK:   Moderate:  Frequent suicidal ideation with limited intensity, and duration, some specificity in terms of plans, no associated intent, good self-control, limited dysphoria/symptomatology, some risk factors present, and identifiable protective factors, including available and accessible social support.  PLAN OF CARE: Admit for increased symptoms of bipolar manic symptoms and not able to care for her self and non compliant with mediation therapy needs crisis stabilization, medication management and safety monitoring.   I certify that inpatient services furnished can reasonably be expected to improve the patient's condition.   Leata MouseJANARDHANA Samantha Olivera, MD 01/06/2017, 10:20 AM

## 2017-01-06 NOTE — Progress Notes (Signed)
Adult Psychoeducational Group Note  Date:  01/06/2017 Time:  12:53 AM  Group Topic/Focus:  Wrap-Up Group:   The focus of this group is to help patients review their daily goal of treatment and discuss progress on daily workbooks.  Participation Level:  Active  Participation Quality:  Monopolizing  Affect:  Labile  Cognitive:  Confused  Insight: Limited  Engagement in Group:  Engaged  Modes of Intervention:  Socialization and Support  Additional Comments:  Patient attended and participated in group tonight. She reports having a wonderful day.  She was in the hospital  And [facility]. She has been talking and socializing with peers in the dayroom today.  Lita MainsFrancis, Gwendolin Briel [facility] 01/06/2017, 12:53 AM

## 2017-01-06 NOTE — Progress Notes (Signed)
D: Pt remains grandiose during conversations "I'm Yvonne [name] sweetheart, I own multiple busisness" "you [place] Ms. Yvonne [name] and just pay $300.00 stay with me in my suite". Still intrusive and disruptive in milieu.  A: Continued verbal redirections offered. Emotional support provided to pt. Close observations continues without self harm gestures.  R: Pt is argumentative, oppositional, demanding and not receptive to staff's redirections. Continue to refuse medications when offered. Tolerates all PO intake well.

## 2017-01-07 NOTE — [facility] Group Notes (Signed)
[facility] LCSW Group Therapy  01/07/2017  1:05 PM  Type of Therapy:  Group therapy  Participation Level:  Active  Participation Quality:  Attentive  Affect:  Flat  Cognitive:  Oriented  Insight:  Limited  Engagement in Therapy:  Limited  Modes of Intervention:  Discussion, Socialization  Summary of Progress/Problems:  Chaplain was here to lead a group on themes of hope and courage. Stayed the entire time, engaged throughout.  Limited insight. "You have to care for yourself first.  I do it by bathing, oral hygiene, pampering with nails and facials. Also getting sleep, drinking plenty of water and exercise. All my family is dead, so I have to do all this for myself."  Later, "I experienced care from others when I was staying at the homeless shelter, and church groups not only fed us, but they warmly welcomed us."   Yvonne [name], Yvonne [name] 01/07/2017 2:53 PM

## 2017-01-07 NOTE — Progress Notes (Signed)
Recreation Therapy Notes  Date: 01/07/17 Time: 1000 Location: 500 Hall Dayroom  Group Topic: Leisure Education  Goal Area(s) Addresses:  Patient will identify positive leisure activities.  Patient will identify one positive benefit of participation in leisure activities.   Behavioral Response: Engaged  Intervention: Markers, scissors, construction paper, tape/glue, magazines  Activity: Leisure PSA.  Patients were to create [name] public service announcement to explain the benefits of leisure.  Patients were to also show what kinds of activities can be done for leisure enjoyment.  Education:  Leisure Education, Building control surveyorDischarge Planning  Education Outcome: Acknowledges education/In group clarification offered/Needs additional education  Clinical Observations/Feedback: Pt stated off being disruptive and focused on everyone but herself.  Pt was redirected by LRT.  Pt got upset and told LRT not to talk to her.  LRT told pt she could leave but pt stayed and completed the assignment and improved her mood.  Pt stated she likes to eat because it makes her feel good.  Pt also stated she makes good use of her leisure.    Yvonne RancherMarjette Garnet [name], LRT/CTRS         Yvonne RancherLindsay, Yvonne [name] 01/07/2017 12:29 PM

## 2017-01-07 NOTE — Progress Notes (Signed)
[facility] MD Progress Note  01/07/2017 3:41 PM Yvonne [name]  MRN:  [id number]  Subjective: [name] says, "I'm doing good. I'm not a patient here. I'm just a homeless veteran. I'm here because I cannot be on the streets. It is too hot & unsafe out there. But, I'm being treated like a patient here & I do not like that at all. I will be here till Monday then I will go to [facility]. That where I'm going to be living for a while".  Objective: Tenecia Jonesis an 51 y.o.female, Admitted involuntarily emergently from [facility] long emergency department for increased dangerous, disruptive and combative behaviors as per [employer]. Patient presented with severe symptoms of bipolar mania, irritability, agitation, aggressive behavior, delusional thoughts, disorganized, intrusive and impulsive. Patient is also very poor historian, repeats herself several times saying that she is a veteran, homeless and cannot be placed on the streets and she does not need to see in hospital she needed to be put in a motel by CSW.  [name] is seen, chart reviewed. She is alert, oriented & manic. She is talkative. Her speech is pressured, very difficult to interrupt or re-direct. She presents very disorganized, tangential, circumstantial & grandiose. She says she is not a patient, upset with the staff because they are treating & addressing as one of the patients. She says she is just a homeless veteran. She says she is here because she cannot be out there on the streets because it is too hot & too dangerous. She says she is taking Tegretol, that's about it. She says she will be here till Monday, then will go to the [facility] where she will reside for a while. She denies any SIHI, AVH, however, remains delusional. She is visible on the unit, participating in the group sessions. Staff reports indicates that [name] is refusing some of her medications.  Principal Problem: Bipolar affective disorder, current episodes without psychotic  features.  Diagnosis:   Patient Active Problem List   Diagnosis Date Noted  . Bipolar affective disorder, current episode manic without psychotic symptoms (HCC) [F31.10] 01/05/2017  . Involuntary commitment [Z04.6]   . Manic behavior (HCC) [F30.10]   . Facial cellulitis [L03.211] 02/10/2014  . Bipolar disord, crnt epsd mixed, severe, w/o psych features (HCC) [F31.63] 04/25/2013  . Bipolar disorder, unspecified (HCC) [F31.9] 01/02/2013  . MDD (major depressive disorder) [F32.9] 08/02/2012   Total Time spent with patient: 25 minutes  Past Psychiatric History: Bipolar disorder.  Past Medical History:  Past Medical History:  Diagnosis Date  . Bipolar 1 disorder (HCC)   . Medical history non-contributory   . Mental disorder   . No pertinent past medical history     Past Surgical History:  Procedure Laterality Date  . NO PAST SURGERIES     Family History:  Family History  Problem Relation Age of Onset  . Depression Sister   . Depression Brother   . Depression Sister   . CAD Mother   . Hypertension Father   . Diabetes Father   . Bipolar disorder Cousin    Family Psychiatric  History: See H&P  Social History:  History  Alcohol Use No     History  Drug Use No    Social History   Social History  . Marital status: Single    Spouse name: N/A  . Number of children: N/A  . Years of education: N/A   Social History Main Topics  . Smoking status: Never Smoker  . Smokeless tobacco:  Never Used  . Alcohol use No  . Drug use: No  . Sexual activity: No   Other Topics Concern  . None   Social History Narrative  . None   Additional Social History:   Sleep: Good  Appetite:  Fair  Current Medications: Current Facility-Administered Medications  Medication Dose Route Frequency Provider Last Rate Last Dose  . acetaminophen (TYLENOL) tablet 650 mg  650 mg Oral Q6H PRN Laveda Abbe, NP      . alum & mag hydroxide-simeth (MAALOX/MYLANTA) 200-200-20 MG/5ML  suspension 30 mL  30 mL Oral Q4H PRN Laveda Abbe, NP      . carbamazepine (TEGRETOL XR) 12 hr tablet 400 mg  400 mg Oral Daily Laveda Abbe, NP   400 mg at 01/07/17 0844  . feeding supplement (ENSURE ENLIVE) (ENSURE ENLIVE) liquid 237 mL  237 mL Oral BID BM Eappen, Saramma, MD      . haloperidol (HALDOL) tablet 5 mg  5 mg Oral Q8H PRN Seren Chaloux, Rockey Situ, MD   5 mg at 01/06/17 1820   Or  . haloperidol lactate (HALDOL) injection 5 mg  5 mg Intramuscular Q8H PRN Mertis Mosher, Rockey Situ, MD      . hydrOXYzine (ATARAX/VISTARIL) tablet 25 mg  25 mg Oral Q6H PRN Laveda Abbe, NP      . LORazepam (ATIVAN) tablet 1 mg  1 mg Oral Q6H PRN Clavin Ruhlman, Rockey Situ, MD   1 mg at 01/06/17 1820   Or  . LORazepam (ATIVAN) injection 1 mg  1 mg Intramuscular Q6H PRN Mardy Hoppe A, MD      . magnesium hydroxide (MILK OF MAGNESIA) suspension 30 mL  30 mL Oral Daily PRN Laveda Abbe, NP      . OLANZapine zydis (ZYPREXA) disintegrating tablet 10 mg  10 mg Oral QHS Laveda Abbe, NP      . traZODone (DESYREL) tablet 50 mg  50 mg Oral QHS PRN Laveda Abbe, NP        Lab Results: No results found for this or any previous visit (from the past 48 hour(s)).  Blood Alcohol level:  Lab Results  Component Value Date   ETH <5 01/02/2017   ETH <5 11/24/2016    Metabolic Disorder Labs: Lab Results  Component Value Date   HGBA1C 5.5 06/21/2012   MPG 111 06/21/2012   No results found for: PROLACTIN Lab Results  Component Value Date   CHOL 166 06/21/2012   TRIG 58 06/21/2012   HDL 58 06/21/2012   CHOLHDL 2.9 06/21/2012   VLDL 12 06/21/2012   LDLCALC 96 06/21/2012   Physical Findings: AIMS: Facial and Oral Movements Muscles of Facial Expression: None, normal Lips and Perioral Area: None, normal Jaw: None, normal Tongue: None, normal,Extremity Movements Upper (arms, wrists, hands, fingers): None, normal Lower (legs, knees, ankles, toes): None, normal, Trunk  Movements Neck, shoulders, hips: None, normal, Overall Severity Severity of abnormal movements (highest score from questions above): None, normal Incapacitation due to abnormal movements: None, normal Patient's awareness of abnormal movements (rate only patient's report): No Awareness, Dental Status Current problems with teeth and/or dentures?: No Does patient usually wear dentures?: No  CIWA:    COWS:     Musculoskeletal: Strength & Muscle Tone: within normal limits Gait & Station: normal Patient leans: N/A  Psychiatric Specialty Exam: Physical Exam: Nurses notes & Vital signs reviewed.  Review of Systems  Psychiatric/Behavioral: Positive for depression ("Manic depression") and hallucinations (Psychotic). Negative for memory  loss, substance abuse and suicidal ideas. The patient is nervous/anxious and has insomnia.     Blood pressure 109/76, pulse (!) 134, temperature 99.6 F (37.6 C), temperature source Oral, resp. rate 16, height 5' 0.75" (1.543 m), weight 51.3 kg (113 lb).Body mass index is 21.53 kg/m.  General Appearance: Bizarre, manic, talkative, delusional, grandiose  Eye Contact:  Good  Speech:  Pressured  Volume:  Increased  Mood: Euphoric, manic  Affect: Labile, manic  Thought Process:  Coherent and Disorganized, tangential.  Orientation:  Full (Time, Place, and Person)  Thought Content: Rumination and Tangential  Suicidal Thoughts:  Denies  Homicidal Thoughts: Denies  Memory:  Immediate;   Good Recent;   Fair Remote;   Fair  Judgement:  Impaired  Insight:  Fair  Psychomotor Activity:  Increased and Restlessness  Concentration:  Concentration: Fair and Attention Span: Poor  Recall:  [employer]Fair  Fund of Knowledge:  Good  Language:  Good  Akathisia:  Negative  Handed:  Right  AIMS (if indicated):     Assets:  Communication Skills Desire for Improvement Financial Resources/Insurance Leisure Time Physical Health Resilience Social Support  ADL's:  Impaired   Cognition:  WNL  Sleep:  Number of Hours: 6.75     Treatment Plan Summary: Patient remains manic, talkative & grandiose. No dangerousness. She is refusing to take any other medications except Tegretol.     Psychiatric: Bipolar disorder, current episode, manic.  Medical: Will continue monitor for any symptoms & treat on a prn basis.  Psychosocial:  Will encourage group counseling attendance & participation.  PLAN: 1.01-07-17: No changes made on the current plan of care, continue current regimen as recommended.  Agitation/anxiety: Continue Haldol 5 mg Po or Injection. Continue Ativan 1 mg orally or IM Q 6 hours prn. Continue Hydroxyzine 25 mg prn Q 6 hours.  Mood control: Continue Zyprexa Zydis disintegrating tablet Q hs.    Mood Stabilization:  Will continue Tegretol XR 400 mg Q bedtime.  Insomnia: Continue Trazodone 50 mg prn.  Nicotine Withdrawal symptoms: Will continue the nicotine gum.  2. Continue to monitor mood, behavior and interaction with peers  Sanjuana KavaNwoko, Agnes I, NP, PMHNP, FNP-BC 01/07/2017, 3:41 PM   Agree with NP Progress Note

## 2017-01-07 NOTE — Progress Notes (Signed)
CO Note  Pt is seating in the dayroom eating breakfast at this time, pt is calm and talking with peers. Pt remains safe at this time, will continue to monitor.

## 2017-01-07 NOTE — Progress Notes (Signed)
CO Note   Pt at this time is in bed restingwith eyes closed. Pt doesnot look to be in any distress at this time. staff is present in room with Pt at this time. CO monitoring continues for Pt's safety. 15-minute safety checks also continues at this time. 

## 2017-01-07 NOTE — Progress Notes (Addendum)
CO Note Pt has been loud in the milieu, pt took her medication at several encouragement, pt seating the dayroom napping, will continue to monitor.

## 2017-01-07 NOTE — Progress Notes (Signed)
CO Note Pt in the day room at this time interacting with peers, pt has been after taking her meidcation this morning. No concern at this time, will continue to monitor.

## 2017-01-07 NOTE — Tx Team (Signed)
Interdisciplinary Treatment and Diagnostic Plan Update  01/07/2017 Time of Session: 2:57 PM  Yvonne [name] MRN: [id number]  Principal Diagnosis: <principal problem not specified>  Secondary Diagnoses: Active Problems:   Bipolar affective disorder, current episode manic without psychotic symptoms (HCC)   Current Medications:  Current Facility-Administered Medications  Medication Dose Route Frequency Provider Last Rate Last Dose  . acetaminophen (TYLENOL) tablet 650 mg  650 mg Oral Q6H PRN Ethelene Hal, NP      . alum & mag hydroxide-simeth (MAALOX/MYLANTA) 200-200-20 MG/5ML suspension 30 mL  30 mL Oral Q4H PRN Ethelene Hal, NP      . carbamazepine (TEGRETOL XR) 12 hr tablet 400 mg  400 mg Oral Daily Ethelene Hal, NP   400 mg at 01/07/17 0844  . feeding supplement (ENSURE ENLIVE) (ENSURE ENLIVE) liquid 237 mL  237 mL Oral BID BM Eappen, Saramma, MD      . haloperidol (HALDOL) tablet 5 mg  5 mg Oral Q8H PRN Cobos, Myer Peer, MD   5 mg at 01/06/17 1820   Or  . haloperidol lactate (HALDOL) injection 5 mg  5 mg Intramuscular Q8H PRN Cobos, Myer Peer, MD      . hydrOXYzine (ATARAX/VISTARIL) tablet 25 mg  25 mg Oral Q6H PRN Ethelene Hal, NP      . LORazepam (ATIVAN) tablet 1 mg  1 mg Oral Q6H PRN Cobos, Myer Peer, MD   1 mg at 01/06/17 1820   Or  . LORazepam (ATIVAN) injection 1 mg  1 mg Intramuscular Q6H PRN Cobos, Myer Peer, MD      . magnesium hydroxide (MILK OF MAGNESIA) suspension 30 mL  30 mL Oral Daily PRN Ethelene Hal, NP      . OLANZapine zydis (ZYPREXA) disintegrating tablet 10 mg  10 mg Oral QHS Ethelene Hal, NP      . traZODone (DESYREL) tablet 50 mg  50 mg Oral QHS PRN Ethelene Hal, NP        PTA Medications: Prescriptions Prior to Admission  Medication Sig Dispense Refill Last Dose  . carbamazepine (TEGRETOL XR) 400 MG 12 hr tablet Take 1 tablet (400 mg total) by mouth daily. (Patient not taking: Reported on  01/02/2017) 60 tablet 1 Not Taking at Unknown time  . citalopram (CELEXA) 40 MG tablet Take 1 tablet (40 mg total) by mouth daily. (Patient not taking: Reported on 01/02/2017) 30 tablet 2 Not Taking at Unknown time    Patient Stressors: Marital or family conflict  Patient Strengths: Physical Health Religious Affiliation  Treatment Modalities: Medication Management, Group therapy, Case management,  1 to 1 session with clinician, Psychoeducation, Recreational therapy.   Physician Treatment Plan for Primary Diagnosis: <principal problem not specified> Long Term Goal(s): Improvement in symptoms so as ready for discharge  Short Term Goals: Ability to identify changes in lifestyle to reduce recurrence of condition will improve Ability to verbalize feelings will improve Ability to disclose and discuss suicidal ideas Ability to demonstrate self-control will improve Ability to identify and develop effective coping behaviors will improve Ability to maintain clinical measurements within normal limits will improve Compliance with prescribed medications will improve Ability to identify triggers associated with substance abuse/mental health issues will improve  Medication Management: Evaluate patient's response, side effects, and tolerance of medication regimen.  Therapeutic Interventions: 1 to 1 sessions, Unit Group sessions and Medication administration.  Evaluation of Outcomes: Progressing  Physician Treatment Plan for Secondary Diagnosis: Active Problems:   Bipolar affective disorder, current  episode manic without psychotic symptoms ([place])   Long Term Goal(s): Improvement in symptoms so as ready for discharge  Short Term Goals: Ability to identify changes in lifestyle to reduce recurrence of condition will improve Ability to verbalize feelings will improve Ability to disclose and discuss suicidal ideas Ability to demonstrate self-control will improve Ability to identify and develop  effective coping behaviors will improve Ability to maintain clinical measurements within normal limits will improve Compliance with prescribed medications will improve Ability to identify triggers associated with substance abuse/mental health issues will improve  Medication Management: Evaluate patient's response, side effects, and tolerance of medication regimen.  Therapeutic Interventions: 1 to 1 sessions, Unit Group sessions and Medication administration.  Evaluation of Outcomes: Progressing   RN Treatment Plan for Primary Diagnosis: <principal problem not specified> Long Term Goal(s): Knowledge of disease and therapeutic regimen to maintain health will improve  Short Term Goals: Ability to identify and develop effective coping behaviors will improve and Compliance with prescribed medications will improve  Medication Management: RN will administer medications as ordered by provider, will assess and evaluate patient's response and provide education to patient for prescribed medication. RN will report any adverse and/or side effects to prescribing provider.  Therapeutic Interventions: 1 on 1 counseling sessions, Psychoeducation, Medication administration, Evaluate responses to treatment, Monitor vital signs and CBGs as ordered, Perform/monitor CIWA, COWS, AIMS and Fall Risk screenings as ordered, Perform wound care treatments as ordered.  Evaluation of Outcomes: Progressing    Recreational Therapy Treatment Plan for Primary Diagnosis: Bipolar disord, crnt epsd mixed, severe, w/o psych features ([place]) Long Term Goal(s): Patient will participate in recreation therapy treatment in at least 2 group sessions without prompting from LRT  Short Term Goals: Patient will demonstrate increased ability to follow instructions, as demonstrated by ability to follow LRT instructions on first prompt during recreation therapy group sessions  Treatment Modalities: Group and Pet Therapy  Therapeutic  Interventions: Psychoeducation  Evaluation of Outcomes: Progressing   LCSW Treatment Plan for Primary Diagnosis: <principal problem not specified> Long Term Goal(s): Safe transition to appropriate next level of care at discharge, Engage patient in therapeutic group addressing interpersonal concerns.  Short Term Goals: Engage patient in aftercare planning with referrals and resources  Therapeutic Interventions: Assess for all discharge needs, 1 to 1 time with Social worker, Explore available resources and support systems, Assess for adequacy in community support network, Educate family and significant other(s) on suicide prevention, Complete Psychosocial Assessment, Interpersonal group therapy.  Evaluation of Outcomes: Met  Pt states she will stay in shelter, follow up [facility] in Treatment: Attending groups: Yes Participating in groups: Yes Taking medication as prescribed: Yes Toleration medication: Yes, no side effects reported at this time Family/Significant other contact made:  Patient understands diagnosis: Yes AEB Discussing patient identified problems/goals with staff: Yes Medical problems stabilized or resolved: Yes Denies suicidal/homicidal ideation: Yes Issues/concerns per patient self-inventory: None Other: N/A  New problem(s) identified: Pt being seen for second opinion as she is currently refusing meds   New Short Term/Long Term Goal(s): "Dr Darleene Cleaver told me I could be discharged today!  I only take Celexa.  I spent $!737.10 on a concert in [place] this weekend, and there's gonna be trouble if I am not at that concert. I am a homeless vet and I need you to find me housing."  Discharge Plan or Barriers:   Reason for Continuation of Hospitalization:  Mania  Medication stabilization   Estimated Length of Stay:  5 days  Attendees: Patient: Yvonne [name] 01/07/2017  2:57 PM  Physician: Ursula Alert, MD 01/07/2017  2:57 PM   Nursing: Sena Hitch, RN 01/07/2017  2:57 PM  RN Care Manager: Lars Pinks, RN 01/07/2017  2:57 PM  Social Worker: Ripley Fraise 01/07/2017  2:57 PM  Recreational Therapist: Victorino Sparrow, LRT/CTRS 01/07/2017  2:57 PM  Other: Norberto Sorenson 01/07/2017  2:57 PM  Other:  01/07/2017  2:57 PM    Scribe for Treatment Team:  Roque Lias LCSW 01/07/2017 2:57 PM
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
## 2017-01-08 NOTE — Progress Notes (Signed)
[facility] Group Notes:  (Nursing/MHT/Case Management/Adjunct)  Date:  01/08/2017  Time:  10:03 PM  Type of Therapy:  Psychoeducational Skills  Participation Level:  Active  Participation Quality:  Appropriate  Affect:  Appropriate  Cognitive:  Appropriate  Insight:  Appropriate  Engagement in Group:  Engaged  Modes of Intervention:  Education  Summary of Progress/Problems: The patient verbalized that she had a good day and that she enjoyed the meals more than yesterday. She was also pleased that her close observation was discontinued. Her coping skill (theme of the day) will be to eat out more often.   Sailor Haughn S 01/08/2017, 10:03 PM

## 2017-01-08 NOTE — [facility] Group Notes (Signed)
[facility] Group Notes:  (Clinical Social Work)  01/08/2017  11:15-12:00PM  Summary of Progress/Problems:   Today's process group involved patients discussing their feelings related to being hospitalized, as well as benefits they see to being in the hospital.  The patient expressed a primary feeling about being hospitalized is "I don't like it, I'm frustrated and irritated."  She was intrusive and disruptive to the group numerous times, difficult to redirect.  She was mostly focused on how unfair things have been for another patient, but also persevered throughout group to demand that CSW call [facility] shelter to secure her a shelter bed for Monday, despite the many times CSW deferred this for Monday staff and gave reasons.    Type of Therapy:  Group Therapy - Process  Participation Level:  Active  Participation Quality:  Intrusive, Sharing and Supportive  Affect:  Labile  Cognitive:  Disorganized  Insight:  Limited  Engagement in Therapy:  Developing/Improving  Modes of Intervention:  Exploration, Discussion  Ambrose MantleMareida Grossman-Orr, LCSW 01/08/2017, 12:57 PM

## 2017-01-08 NOTE — Progress Notes (Signed)
Close Observation (CO) Note    Pt at this time is in dayroom sitting/resting with eyes closed. Pt still manic and hyper-verbal however, without being intrusive. Pt refused all 2200 medications; "I am a veteran and homeless; I am not a patient here; I just needed a place to stay for a while." Pt does not look to be in any distress at this time. CO staff is present in room with Pt at this time. CO monitoring continues for Pt's safety. 15-minute safety checks also continues at this time.

## 2017-01-08 NOTE — Progress Notes (Signed)
CO Note   Pt at this time is in bed resting. Pt doesnot look to be in any distress at this time. staff is present in room with Pt at this time. CO monitoring continues for Pt's safety. 15-minute safety checks also continues at this time.

## 2017-01-08 NOTE — Progress Notes (Signed)
CO Note   Pt at this time is in bed restingwith eyes closed. Pt doesnot look to be in any distress at this time. staff is present in room with Pt at this time. CO monitoring continues for Pt's safety. 15-minute safety checks also continues at this time.

## 2017-01-09 NOTE — Progress Notes (Signed)
[facility]  01/09/2017 12:25 PM Yvonne BalsamMelvine [name]  MRN:  [id number]  Subjective: Yvonne [name] says, "I'm doing good. I'm not a patient here. I'm just a homeless veteran. I am doing better. I just need to talk to Mr. Yvonne IslamRod tomorrow about [facility]. I don't have any money either.     Objective: Yvonne Jonesis an 51 y.o.female, [facility] for increased dangerous, disruptive and combative behaviors as per [employer]. Patient presented with severe symptoms of bipolar mania, irritability, agitation, aggressive behavior, delusional thoughts, disorganized, intrusive and impulsive. Patient is also very poor historian, repeats herself several times saying that she is a veteran, homeless and cannot be placed on the streets and she does not need to see in hospital she needed to be put in a motel by CSW.  Yvonne [name] is seen, chart reviewed. She is alert, oriented & but much less manic than on previous days. The patient still has had some occasions of being labile, hyperverbal, and intrusive. Her speech continues to be somewhat pressured, and is somewhat difficult to  re-direct. She presents as tangential, circumstantial & grandiose. She says she is just a homeless veteran. She says she is here because she cannot be out there on the streets because it is too hot & too dangerous.  She says she will be here till Monday, then [facility] for a while. She denies any SIHI, AVH, however, remains delusional.  She is visible on the unit, participating in the group sessions. Staff reports that Yvonne [name] is not taking her prescribed medications and is having better behavior on the unit. Her close observation was successfully discontinued yesterday.   Principal Problem: Bipolar affective disorder, current episodes without psychotic features.  Diagnosis:   Patient Active Problem List   Diagnosis Date Noted  . Bipolar  affective disorder, current episode manic without psychotic symptoms (HCC) [F31.10] 01/05/2017  . Involuntary commitment [Z04.6]   . Manic behavior (HCC) [F30.10]   . Facial cellulitis [L03.211] 02/10/2014  . Bipolar disord, crnt epsd mixed, severe, w/o psych features (HCC) [F31.63] 04/25/2013  . Bipolar disorder, unspecified (HCC) [F31.9] 01/02/2013  . MDD (major depressive disorder) [F32.9] 08/02/2012   Total Time spent with patient: 15 minutes  Past Psychiatric History: Bipolar disorder.  Past Medical History:  Past Medical History:  Diagnosis Date  . Bipolar 1 disorder (HCC)   . Medical history non-contributory   . Mental disorder   . No pertinent past medical history     Past Surgical History:  Procedure Laterality Date  . NO PAST SURGERIES     Family History:  Family History  Problem Relation Age of Onset  . Depression Sister   . Depression Brother   . Depression Sister   . CAD Mother   . Hypertension Father   . Diabetes Father   . Bipolar disorder Cousin    Family Psychiatric  History: See H&P  Social History:  History  Alcohol Use No     History  Drug Use No    Social History   Social History  . Marital status: Single    Spouse name: N/A  . Number of children: N/A  . Years of education: N/A   Social History Main Topics  . Smoking status: Never Smoker  . Smokeless tobacco: Never Used  . Alcohol use No  . Drug use: No  . Sexual activity: No   Other Topics Concern  .  None   Social History Narrative  . None   Additional Social History:   Sleep: Good  Appetite:  Fair  Current Medications: Current Facility-Administered Medications  Medication Dose Route Frequency Provider Last Rate Last Dose  . acetaminophen (TYLENOL) tablet 650 mg  650 mg Oral Q6H PRN Laveda Abbe, NP      . alum & mag hydroxide-simeth (MAALOX/MYLANTA) 200-200-20 MG/5ML suspension 30 mL  30 mL Oral Q4H PRN Laveda Abbe, NP      . carbamazepine (TEGRETOL  XR) 12 hr tablet 400 mg  400 mg Oral Daily Laveda Abbe, NP   400 mg at 01/09/17 0800  . feeding supplement (ENSURE ENLIVE) (ENSURE ENLIVE) liquid 237 mL  237 mL Oral BID BM Eappen, Saramma, MD      . haloperidol (HALDOL) tablet 5 mg  5 mg Oral Q8H PRN Cobos, Rockey Situ, MD   5 mg at 01/06/17 1820   Or  . haloperidol lactate (HALDOL) injection 5 mg  5 mg Intramuscular Q8H PRN Cobos, Rockey Situ, MD      . hydrOXYzine (ATARAX/VISTARIL) tablet 25 mg  25 mg Oral Q6H PRN Laveda Abbe, NP      . LORazepam (ATIVAN) tablet 1 mg  1 mg Oral Q6H PRN Cobos, Rockey Situ, MD   1 mg at 01/06/17 1820   Or  . LORazepam (ATIVAN) injection 1 mg  1 mg Intramuscular Q6H PRN Cobos, Fernando A, MD      . magnesium hydroxide (MILK OF MAGNESIA) suspension 30 mL  30 mL Oral Daily PRN Laveda Abbe, NP      . OLANZapine zydis (ZYPREXA) disintegrating tablet 10 mg  10 mg Oral QHS Laveda Abbe, NP      . traZODone (DESYREL) tablet 50 mg  50 mg Oral QHS PRN Laveda Abbe, NP        Lab Results: No results found for this or any previous visit (from the past 48 hour(s)).  Blood Alcohol level:  Lab Results  Component Value Date   ETH <5 01/02/2017   ETH <5 11/24/2016    Metabolic Disorder Labs: Lab Results  Component Value Date   HGBA1C 5.5 06/21/2012   MPG 111 06/21/2012   No results found for: PROLACTIN Lab Results  Component Value Date   CHOL 166 06/21/2012   TRIG 58 06/21/2012   HDL 58 06/21/2012   CHOLHDL 2.9 06/21/2012   VLDL 12 06/21/2012   LDLCALC 96 06/21/2012   Physical Findings: AIMS: Facial and Oral Movements Muscles of Facial Expression: None, normal Lips and Perioral Area: None, normal Jaw: None, normal Tongue: None, normal,Extremity Movements Upper (arms, wrists, hands, fingers): None, normal Lower (legs, knees, ankles, toes): None, normal, Trunk Movements Neck, shoulders, hips: None, normal, Overall Severity Severity of abnormal movements  (highest score from questions above): None, normal Incapacitation due to abnormal movements: None, normal Patient's awareness of abnormal movements (rate only patient's report): No Awareness, Dental Status Current problems with teeth and/or dentures?: No Does patient usually wear dentures?: No  CIWA:    COWS:     Musculoskeletal: Strength & Muscle Tone: within normal limits Gait & Station: normal Patient leans: N/A  Psychiatric Specialty Exam: Physical Exam: Nurses notes & Vital signs reviewed.  Review of Systems  Psychiatric/Behavioral: Negative for depression ("Manic depression"), hallucinations (Psychotic), memory loss, substance abuse and suicidal ideas. The patient is nervous/anxious. The patient does not have insomnia.     Blood pressure 108/74, pulse 98, temperature 98.7  F (37.1 C), temperature source Oral, resp. rate 16, height 5' 0.75" (1.543 m), weight 51.3 kg (113 lb).Body mass index is 21.53 kg/m.  General Appearance:  talkative, delusional, grandiose  Eye Contact:  Good  Speech:  Pressured  Volume:   Normal   Mood: Euphoric, manic but improving  Affect: Labile, manic  Thought Process:  Coherent, tangential.  Orientation:  Full (Time, Place, and Person)  Thought Content: Rumination and Tangential  Suicidal Thoughts:  Denies  Homicidal Thoughts: Denies  Memory:  Immediate;   Good Recent;   Fair Remote;   Fair  Judgement:  Impaired  Insight:  Fair  Psychomotor Activity:  Restlessness  Concentration:  Concentration: Fair and Attention Span: Fair  Recall:  [employer] of Knowledge:  Good  Language:  Good  Akathisia:  Negative  Handed:  Right  AIMS (if indicated):     Assets:  Communication Skills Desire for Improvement Financial Resources/Insurance Leisure Time Physical Health Resilience Social Support  ADL's:  Impaired  Cognition:  WNL  Sleep:  Number of Hours: 6.75     Treatment Plan Summary: Patient remains manic, talkative & grandiose. No  dangerousness. Nursing staff reports that patient is compliant with her current regimen.    Psychiatric: Bipolar disorder, current episode, manic.  Medical: Will continue monitor for any symptoms & treat on a prn basis.  Psychosocial:  Will encourage group counseling attendance & participation.  PLAN: 1.01-09-17: No changes made on the current plan of care, continue current regimen as recommended.  Agitation/anxiety: Continue Haldol 5 mg Po or Injection. Continue Ativan 1 mg orally or IM Q 6 hours prn. Continue Hydroxyzine 25 mg prn Q 6 hours.  Mood control: Continue Zyprexa Zydis disintegrating tablet 10 mg Q hs.    Mood Stabilization:  Will continue Tegretol XR 400 mg Q bedtime. Schedule total tegretol level to be drawn for am of 01/10/2017.   Insomnia: Continue Trazodone 50 mg prn.  Nicotine Withdrawal symptoms: Will continue the nicotine gum.  2. Continue to monitor mood, behavior and interaction with peers Possible discharge tomorrow.   Fransisca Kaufmann, NP, PMHNP-BC 01/09/2017, 12:25 PM   Notes reviewed agree with plan

## 2017-01-09 NOTE — Plan of Care (Signed)
Problem: Safety: Goal: Periods of time without injury will increase Outcome: Progressing Patient is on q15 minute safety checks and low fall risk precautions. Patient contracts for safety and is currently redirectable by staff.

## 2017-01-09 NOTE — Progress Notes (Signed)
Pt on unit interacting with pt in loud manner and is trying to dictate tv channel in day room and discussing things she has determined at privileges give to other patients.  This patient is upset that other pt. Received soda and chips from cafeteria despite pt paying for them.  Pt is loud and rude and demanding "Im not a pt here, Im a homeless veteran and Im leaving tomorrow". "Im not taking any meds tonight so dont try to give me any".  Pt sts she has never been SI, HI and AVH. Pt remains in dayroom until it closes for night. Pt returns to room and is talking all the way down the hall. Pt remains safe on unit.

## 2017-01-09 NOTE — Plan of Care (Signed)
Problem: Coping: Goal: Ability to cope will improve Outcome: Progressing Pt remain calm in the unit after staff informed the pt that she was being loud and disturbing others patients.

## 2017-01-09 NOTE — Progress Notes (Signed)
Nursing Progress Note 1900-0730  D) Patient presents labile, hyperverbal and intrusive at times. Patient is redirectable by staff. Patient observed up in the dayroom. Patient reports to writer "I don't take nothing for sleep" and becomes irritable when asked about medications by Clinical research associatewriter. Patient attended group. Patient denies SI/HI/AVH or pain. Patient contracts for safety on the unit. Patient reports sleeping well.  A) Emotional support given. 1:1 interaction and active listening provided. Patient medicated as prescribed. Medications and plan of care reviewed with patient. Patient verbalized understanding without further questions. Snacks and fluids provided. Opportunities for questions or concerns presented to patient. Patient encouraged to continue to work on treatment goals. Labs, vital signs and patient behavior monitored throughout shift. Patient safety maintained with q15 min safety checks. Low fall risk precautions in place and reviewed with patient; patient verbalized understanding.  R) Patient receptive to interaction with nurse. Patient remains safe on the unit at this time. Patient denies any adverse medication reactions at this time. Patient is resting in bed without complaints. Will continue to monitor.

## 2017-01-09 NOTE — Progress Notes (Signed)
DAR NOTE: Patient presents with anxious affect and depressed mood. Pt has been very labile and loud in the milieu, pt has not following redirections and very argumentative with both peers and staff. Denies pain, auditory and visual hallucinations. As for self inventory, pt refused to fill it in.  Maintained on routine safety checks.  Medications given as prescribed.  Support and encouragement offered as needed.  Attended group and participated.  Will continue to monitor.

## 2017-01-09 NOTE — [facility] Group Notes (Signed)
[facility] Group Notes:  (Clinical Social Work)  01/09/2017  11:00AM-12:00PM  Summary of Progress/Problems:  The main focus of today's process group was to listen to a variety of genres of music and to identify that different types of music provoke different responses.  The patient then was able to identify personally what was soothing for them, as well as energizing, as well as how patient can personally use this knowledge in sleep habits, with depression, and with other symptoms.  The patient expressed at the beginning of group the overall feeling of "hurt" by a fellow patient, and throughout group she mentioned this several times, refused to dance with that other patient who asked her to because her feelings had been hurt.  She kept talking over the music or was monopolizing, but was redirectable.  She danced and sang a lot.  Type of Therapy:  Music Therapy   Participation Level:  Active  Participation Quality:  Attentive and Sharing  Affect:  Blunted  Cognitive:  Disorganized  Insight:  Engaged  Engagement in Therapy:  Engaged  Modes of Intervention:   Activity, Exploration  Ambrose MantleMareida Grossman-Orr, LCSW 01/09/2017

## 2017-01-10 DIAGNOSIS — F3163 Bipolar disorder, current episode mixed, severe, without psychotic features: Secondary | ICD-10-CM

## 2017-01-10 MED ORDER — OLANZAPINE 5 MG PO TABS
5.0000 mg | ORAL_TABLET | ORAL | Status: DC
  Filled 2017-01-10 (×4): qty 1

## 2017-01-10 MED ORDER — HALOPERIDOL 5 MG PO TABS
5.0000 mg | ORAL_TABLET | ORAL | Status: DC
  Administered 2017-01-11 – 2017-01-14 (×7): 5 mg via ORAL
  Filled 2017-01-10 (×14): qty 1

## 2017-01-10 MED ORDER — OLANZAPINE 10 MG IM SOLR
5.0000 mg | INTRAMUSCULAR | Status: DC
  Filled 2017-01-10 (×4): qty 10

## 2017-01-10 MED ORDER — HALOPERIDOL LACTATE 5 MG/ML IJ SOLN
5.0000 mg | INTRAMUSCULAR | Status: DC
  Filled 2017-01-10 (×13): qty 1

## 2017-01-10 MED ORDER — BENZTROPINE MESYLATE 1 MG/ML IJ SOLN
INTRAMUSCULAR | Status: AC
  Administered 2017-01-10: 0.5 mg
  Filled 2017-01-10: qty 2

## 2017-01-10 MED ORDER — OLANZAPINE 10 MG IM SOLR: 10.0000 mg | Freq: Three times a day (TID) | INTRAMUSCULAR | Status: DC | PRN

## 2017-01-10 MED ORDER — BENZTROPINE MESYLATE 1 MG/ML IJ SOLN
0.5000 mg | INTRAMUSCULAR | Status: DC
  Filled 2017-01-10 (×10): qty 0.5
  Filled 2017-01-10: qty 2
  Filled 2017-01-10 (×2): qty 0.5

## 2017-01-10 MED ORDER — BENZTROPINE MESYLATE 0.5 MG PO TABS: 0.5000 mg | ORAL_TABLET | Freq: Three times a day (TID) | ORAL | Status: DC | PRN

## 2017-01-10 MED ORDER — OLANZAPINE 10 MG PO TABS: 10.0000 mg | ORAL_TABLET | Freq: Three times a day (TID) | ORAL | Status: DC | PRN

## 2017-01-10 MED ORDER — BENZTROPINE MESYLATE 0.5 MG PO TABS
0.5000 mg | ORAL_TABLET | ORAL | Status: DC
  Administered 2017-01-11 – 2017-01-14 (×7): 0.5 mg via ORAL
  Filled 2017-01-10 (×13): qty 1

## 2017-01-10 MED ORDER — BENZTROPINE MESYLATE 1 MG/ML IJ SOLN
0.5000 mg | Freq: Three times a day (TID) | INTRAMUSCULAR | Status: DC | PRN
Start: 2017-01-10 — End: 2017-01-10
  Administered 2017-01-10: 0.5 mg via INTRAMUSCULAR

## 2017-01-10 NOTE — Progress Notes (Signed)
Adult Psychoeducational Group Note  Date:  01/10/2017 Time:  2:16 AM  Group Topic/Focus:  Wrap-Up Group:   The focus of this group is to help patients review their daily goal of treatment and discuss progress on daily workbooks.  Participation Level:  Active  Participation Quality:  Appropriate and Sharing  Affect:  Appropriate and Excited  Cognitive:  Alert and Appropriate  Insight: Appropriate and Good  Engagement in Group:  Engaged  Modes of Intervention:  Discussion  Additional Comments:  Pt stated her goal for today was to stay to her self and to use her coping skills when she became upset. Pt stated her day was a 3 out of 10. Pt stated her goal for tomorrow is to work with doctor about her discharge plan.  Yvonne FurnaceChristopher  Yvonne [name] 01/10/2017, 2:16 AM

## 2017-01-10 NOTE — Progress Notes (Signed)
Pt refused to have lab work drawn 

## 2017-01-10 NOTE — Progress Notes (Signed)
[facility] MD Progress Note  01/10/2017 1:49 PM Yvonne [name]  MRN:  [id number] Subjective:  Patient states " I am fine.'  Objective:Patient seen and chart reviewed.Discussed patient with treatment team.  Pt seen as manic, euphoric , loud , agitated , yelling often , unable to sit down for an evaluation. Pt with flight of idea , tangential thought process , is easily irritable , threatening staff , not redirectable. Pt is on forced medication order , started by provider on the weekend . Pt per writer also appears to need forced medications since she is refusing to participate and is manic, disruptive in milieu and also is getting other patients agitated. Pt continues to need support.    Principal Problem: Bipolar disord, crnt epsd mixed, severe, w/o psych features [facility]) Diagnosis:   Patient Active Problem List   Diagnosis Date Noted  . Involuntary commitment [Z04.6]   . Manic behavior (HCC) [F30.10]   . Facial cellulitis [L03.211] 02/10/2014  . Bipolar disord, crnt epsd mixed, severe, w/o psych features (HCC) [F31.63] 04/25/2013  . Bipolar disorder, unspecified (HCC) [F31.9] 01/02/2013  . MDD (major depressive disorder) [F32.9] 08/02/2012   Total Time spent with patient: 30 minutes  Past Psychiatric History: Please see H&P.   Past Medical History:  Past Medical History:  Diagnosis Date  . Bipolar 1 disorder (HCC)   . Medical history non-contributory   . Mental disorder   . No pertinent past medical history     Past Surgical History:  Procedure Laterality Date  . NO PAST SURGERIES     Family History:  Family History  Problem Relation Age of Onset  . Depression Sister   . Depression Brother   . Depression Sister   . CAD Mother   . Hypertension Father   . Diabetes Father   . Bipolar disorder Cousin    Family Psychiatric  History: Please see H&P.  Social History: Please see H&P.  History  Alcohol Use No     History  Drug Use No    Social History   Social History   . Marital status: Single    Spouse name: N/A  . Number of children: N/A  . Years of education: N/A   Social History Main Topics  . Smoking status: Never Smoker  . Smokeless tobacco: Never Used  . Alcohol use No  . Drug use: No  . Sexual activity: No   Other Topics Concern  . None   Social History Narrative  . None   Additional Social History:                         Sleep: did not respond  Appetite:  did not respond  Current Medications: Current Facility-Administered Medications  Medication Dose Route Frequency Provider Last Rate Last Dose  . benztropine mesylate (COGENTIN) 1 MG/ML injection           . acetaminophen (TYLENOL) tablet 650 mg  650 mg Oral Q6H PRN Laveda Abbe, NP      . alum & mag hydroxide-simeth (MAALOX/MYLANTA) 200-200-20 MG/5ML suspension 30 mL  30 mL Oral Q4H PRN Laveda Abbe, NP      . benztropine (COGENTIN) tablet 0.5 mg  0.5 mg Oral Q8H PRN Jomarie Longs, MD       Or  . benztropine mesylate (COGENTIN) injection 0.5 mg  0.5 mg Intramuscular Q8H PRN Tyniesha Howald, MD   0.5 mg at 01/10/17 1245  . carbamazepine (TEGRETOL XR) 12 hr  tablet 400 mg  400 mg Oral Daily Laveda Abbe, NP   400 mg at 01/10/17 0746  . feeding supplement (ENSURE ENLIVE) (ENSURE ENLIVE) liquid 237 mL  237 mL Oral BID BM Brayley Mackowiak, MD      . haloperidol (HALDOL) tablet 5 mg  5 mg Oral Q8H PRN Cobos, Rockey Situ, MD   5 mg at 01/06/17 1820   Or  . haloperidol lactate (HALDOL) injection 5 mg  5 mg Intramuscular Q8H PRN Cobos, Rockey Situ, MD   5 mg at 01/10/17 1247  . hydrOXYzine (ATARAX/VISTARIL) tablet 25 mg  25 mg Oral Q6H PRN Laveda Abbe, NP      . LORazepam (ATIVAN) tablet 1 mg  1 mg Oral Q6H PRN Jomarie Longs, MD   1 mg at 01/06/17 1820   Or  . LORazepam (ATIVAN) injection 1 mg  1 mg Intramuscular Q6H PRN Jomarie Longs, MD   1 mg at 01/10/17 1246  . magnesium hydroxide (MILK OF MAGNESIA) suspension 30 mL  30 mL Oral  Daily PRN Laveda Abbe, NP      . OLANZapine [facility]) tablet 5 mg  5 mg Oral Deidre Ala, MD       Or  . OLANZapine (ZYPREXA) injection 5 mg  5 mg Intramuscular BH-qamhs Micco Bourbeau, MD      . traZODone (DESYREL) tablet 50 mg  50 mg Oral QHS PRN Laveda Abbe, NP        Lab Results: No results found for this or any previous visit (from the past 48 hour(s)).  Blood Alcohol level:  Lab Results  Component Value Date   ETH <5 01/02/2017   ETH <5 11/24/2016    Metabolic Disorder Labs: Lab Results  Component Value Date   HGBA1C 5.5 06/21/2012   MPG 111 06/21/2012   No results found for: PROLACTIN Lab Results  Component Value Date   CHOL 166 06/21/2012   TRIG 58 06/21/2012   HDL 58 06/21/2012   CHOLHDL 2.9 06/21/2012   VLDL 12 06/21/2012   LDLCALC 96 06/21/2012    Physical Findings: AIMS: Facial and Oral Movements Muscles of Facial Expression: None, normal Lips and Perioral Area: None, normal Jaw: None, normal Tongue: None, normal,Extremity Movements Upper (arms, wrists, hands, fingers): None, normal Lower (legs, knees, ankles, toes): None, normal, Trunk Movements Neck, shoulders, hips: None, normal, Overall Severity Severity of abnormal movements (highest score from questions above): None, normal Incapacitation due to abnormal movements: None, normal Patient's awareness of abnormal movements (rate only patient's report): No Awareness, Dental Status Current problems with teeth and/or dentures?: No Does patient usually wear dentures?: No  CIWA:    COWS:     Musculoskeletal: Strength & Muscle Tone: within normal limits Gait & Station: normal Patient leans: N/A  Psychiatric Specialty Exam: Physical Exam  Nursing note and vitals reviewed.   Review of Systems  Unable to perform ROS: Acuity of condition    Blood pressure 111/76, pulse (!) 105, temperature 98.6 F (37 C), resp. rate 16, height 5' 0.75" (1.543 m), weight 51.3 kg (113  lb).Body mass index is 21.53 kg/m.  General Appearance: Fairly Groomed  Eye Contact:  Fair  Speech:  Pressured  Volume:  Increased  Mood:  Angry, Anxious and Irritable  Affect:  Labile  Thought Process:  Disorganized, Irrelevant and Descriptions of Associations: Loose  Orientation:  Other:  place, self, situation  Thought Content:  Rumination  Suicidal Thoughts:  No  Homicidal Thoughts:  No  Memory:  Immediate;   Fair Recent;   Poor Remote;   Poor  Judgement:  Impaired  Insight:  Shallow  Psychomotor Activity:  Restlessness  Concentration:  Concentration: Poor and Attention Span: Poor  Recall:  Poor  Fund of Knowledge:  Poor  Language:  Fair  Akathisia:  No  Handed:  Right  AIMS (if indicated):     Assets:  Others:  access to health care  ADL's:  Impaired  Cognition:  WNL  Sleep:  Number of Hours: 6.25   Bipolar disord, crnt epsd mixed, severe, w/o psych features (HCC) unstable   Treatment Plan Summary:Patient with bipolar do , noncompliant with treatment , currently presents euphoric, manic , lacks judgement , lacks insight in to her illness , does not have any social support , is refusing medications offered , threatening staff and peers , disruptive in milieu , she seems to be a danger to self or others if discharged today due to her presentation , hence will need IP stay and continued treatment.  Daily contact with patient to assess and evaluate symptoms and progress in treatment, Medication management and Plan see below Reviewed past medical records,treatment plan.  Start Haldol 5 mg PO /IM bid for mood sx. Pt is on forced medication order - dated ( 01/07/17) - valid for 7 days . Will continue Tegretol XR 400 mg po bid for mood lability. Tegretol level in 3 days - will get level tomorrow . Will continue to monitor vitals ,medication compliance and treatment side effects while patient is here.  Will monitor for medical issues as well as call consult as needed.  Reviewed  labs- K= - low on admission - will repeat , get tsh, lipid panel, hba1c, pl as well as get tegretol level tomorrow AM . CSW will start working on disposition.  Patient to participate in therapeutic milieu .      Zion Ta, MD 01/10/2017, 1:49 PM

## 2017-01-10 NOTE — Plan of Care (Signed)
Problem: Coping: Goal: Ability to demonstrate self-control will improve Outcome: Not Progressing Pt's mood remains labile. Not redirectable when agitated. Speech remains pressured and loud.

## 2017-01-10 NOTE — Progress Notes (Signed)
Recreation Therapy Notes  Date: 01/10/17 Time: 1000 Location: [address] Dayroom  Group Topic: Coping Skills  Goal Area(s) Addresses:  Patient will be able to identify coping skills. Patient will be able to identify the benefits of positive coping skills. Patient will be able to identify benefits of using coping skills post d/c.  Behavioral Response: Minimal, Redirectable  Intervention: OrthoptistWeb design, pencils  Activity: OrthoptistWeb Design.  Patients were given a blank spider web worksheet.  Patients were to identify the situations that had gotten them "stuck" and brought them to the hospital.  Patients were to then identify 3 coping skills for each issue they identified that had gotten them "stuck".  Education:Coping Skills, Discharge Planning.   Education Outcome: Acknowledges understanding/In group clarification offered/Needs additional education.   Clinical Observations/Feedback: Pt identified the things that have gotten her "stuck" are her family and being a homeless veteran.  Pt did not share her sheet with the group, pt gave it to LRT to read privately.  Pt came up with a coping skill of "get an understanding of life before telling people off".  Pt was unable to identify and other coping skills because she was preoccupied with her peer and what she perceived as unfair treatment from peer and staff.  Pt needed redirection because she would easily get off topic and be focused on others instead of herself.   Caroll RancherMarjette Herve Haug, LRT/CTRS        Lillia AbedLindsay, Shirlene Andaya A 01/10/2017 2:04 PM

## 2017-01-10 NOTE — Progress Notes (Signed)
D: Pt A & O to self and place. Denies SI, HI, AVH and pain "I love myself". Presents with blunted affect and irritable mood. Remains hyperactive, loud, was yelling at Dr. Elna BreslowEappen with hand gestures, demanding to leave and refused to be assessed at the time. Pt's speech is pressured and tangential as well. Disruptive in milieu, verbal abusive and threatening towards staff. Intrusive / impulsive as well. "I better be fucking discharge today, you fucking bitch or else you're going to pay for my damn hotel room in [place]". "I have a bed and I have to be at the shelter at 4pm". Pt behavior escalated as shift progressed; which led to altercations with peers (both female & female peers).  A: PRN and scheduled medications (see emar) administered as prescribed and effects monitored. Emotional support and availability provided to pt. Encouraged pt to voice concerns, attend unit groups and comply with medications as prescribed. Pt informed of changes to her medications. Q 15 minutes safety checks maintained on and off unit without self harm gestures.  R: Pt calm and is verbally redirectable at this time. Compliant with scheduled AM medications "I will take it because I'm getting d/c today, this is my last time taking it".  Pt attended scheduled unit groups but dominated the group, peers left groups due to pt's behavior. Tolerated all PO intake well. POC remains effective for safety and mood stability.

## 2017-01-11 MED ORDER — PROPRANOLOL HCL 10 MG PO TABS
10.0000 mg | ORAL_TABLET | Freq: Two times a day (BID) | ORAL | Status: DC
  Administered 2017-01-12 – 2017-01-14 (×4): 10 mg via ORAL
  Filled 2017-01-11 (×11): qty 1

## 2017-01-11 NOTE — Progress Notes (Signed)
Recreation Therapy Notes  Animal-Assisted Activity (AAA) Program Checklist/Progress Notes Patient Eligibility Criteria Checklist & Daily Group note for Rec Tx Intervention  Date: 07.03.2018 Time: 3:00pm [address]    AAA/T Program Assumption of Risk Form signed by Patient/ or Parent Legal Guardian No  Behavioral Response: Did not attend.    Clinical Observations/Feedback: Patient discussed with MD for appropriateness in pet therapy session. Both LRT and MD agree patient is appropriate for participation. Patient offered participation in session, however politely declined invitation. Due to patient declining offer to participate no consent form signed at this time. Patient pleasant on approach.   Marykay Lexenise L Oralee Rapaport, LRT/CTRS        Sheyenne Konz L 01/11/2017 3:22 PM

## 2017-01-11 NOTE — Progress Notes (Signed)
[facility]  01/11/2017 3:26 PM Yvonne [name]  MRN:  [id number] Subjective:  Patient states " I am ok."  Objective:Patient seen and chart reviewed.Discussed patient with treatment team. Pt seen as manic, however is making progress, now that she is on haldol . Pt denies any ADRs. Pt per staff is more redirectable , however continues to need support .       Principal Problem: Bipolar disord, crnt epsd mixed, severe, w/o psych features [facility](HCC) Diagnosis:   Patient Active Problem List   Diagnosis Date Noted  . Involuntary commitment [Z04.6]   . Manic behavior (HCC) [F30.10]   . Facial cellulitis [L03.211] 02/10/2014  . Bipolar disord, crnt epsd mixed, severe, w/o psych features (HCC) [F31.63] 04/25/2013  . Bipolar disorder, unspecified (HCC) [F31.9] 01/02/2013  . MDD (major depressive disorder) [F32.9] 08/02/2012   Total Time spent with patient: 20 minutes  Past Psychiatric History: Please see H&P.   Past Medical History:  Past Medical History:  Diagnosis Date  . Bipolar 1 disorder (HCC)   . Medical history non-contributory   . Mental disorder   . No pertinent past medical history     Past Surgical History:  Procedure Laterality Date  . NO PAST SURGERIES     Family History:  Family History  Problem Relation Age of Onset  . Depression Sister   . Depression Brother   . Depression Sister   . CAD Mother   . Hypertension Father   . Diabetes Father   . Bipolar disorder Cousin    Family Psychiatric  History: Please see H&P.  Social History: Please see H&P.  History  Alcohol Use No     History  Drug Use No    Social History   Social History  . Marital status: Single    Spouse name: N/A  . Number of children: N/A  . Years of education: N/A   Social History Main Topics  . Smoking status: Never Smoker  . Smokeless tobacco: Never Used  . Alcohol use No  . Drug use: No  . Sexual activity: No   Other Topics Concern  . None   Social History Narrative   . None   Additional Social History:                         Sleep: Fair  Appetite:  Fair  Current Medications: Current Facility-Administered Medications  Medication Dose Route Frequency Provider Last Rate Last Dose  . acetaminophen (TYLENOL) tablet 650 mg  650 mg Oral Q6H PRN Laveda AbbeParks, Laurie Britton, NP      . alum & mag hydroxide-simeth (MAALOX/MYLANTA) 200-200-20 MG/5ML suspension 30 mL  30 mL Oral Q4H PRN Laveda AbbeParks, Laurie Britton, NP      . benztropine (COGENTIN) tablet 0.5 mg  0.5 mg Oral BH-qamhs Nicholson Starace, MD   0.5 mg at 01/11/17 0747   Or  . benztropine mesylate (COGENTIN) injection 0.5 mg  0.5 mg Intramuscular BH-qamhs Alfreda Hammad, MD      . carbamazepine (TEGRETOL XR) 12 hr tablet 400 mg  400 mg Oral Daily Laveda AbbeParks, Laurie Britton, NP   400 mg at 01/11/17 0747  . feeding supplement (ENSURE ENLIVE) (ENSURE ENLIVE) liquid 237 mL  237 mL Oral BID BM Hartlee Amedee, MD      . haloperidol (HALDOL) tablet 5 mg  5 mg Oral BH-qamhs Akhilesh Sassone, MD   5 mg at 01/11/17 0747   Or  . haloperidol lactate (HALDOL) injection 5  mg  5 mg Intramuscular BH-qamhs Sharry Beining, MD      . hydrOXYzine (ATARAX/VISTARIL) tablet 25 mg  25 mg Oral Q6H PRN Laveda Abbe, NP      . LORazepam (ATIVAN) tablet 1 mg  1 mg Oral Q6H PRN Jomarie Longs, MD   1 mg at 01/06/17 1820   Or  . LORazepam (ATIVAN) injection 1 mg  1 mg Intramuscular Q6H PRN Jomarie Longs, MD   1 mg at 01/10/17 1246  . magnesium hydroxide (MILK OF MAGNESIA) suspension 30 mL  30 mL Oral Daily PRN Laveda Abbe, NP      . OLANZapine [facility]) tablet 10 mg  10 mg Oral TID PRN Jomarie Longs, MD       Or  . OLANZapine (ZYPREXA) injection 10 mg  10 mg Intramuscular TID PRN Jomarie Longs, MD      . propranolol (INDERAL) tablet 10 mg  10 mg Oral BID Corney Knighton, MD      . traZODone (DESYREL) tablet 50 mg  50 mg Oral QHS PRN Laveda Abbe, NP        Lab Results: No results found for this  or any previous visit (from the past 48 hour(s)).  Blood Alcohol level:  Lab Results  Component Value Date   ETH <5 01/02/2017   ETH <5 11/24/2016    Metabolic Disorder Labs: Lab Results  Component Value Date   HGBA1C 5.5 06/21/2012   MPG 111 06/21/2012   No results found for: PROLACTIN Lab Results  Component Value Date   CHOL 166 06/21/2012   TRIG 58 06/21/2012   HDL 58 06/21/2012   CHOLHDL 2.9 06/21/2012   VLDL 12 06/21/2012   LDLCALC 96 06/21/2012    Physical Findings: AIMS: Facial and Oral Movements Muscles of Facial Expression: None, normal Lips and Perioral Area: None, normal Jaw: None, normal Tongue: None, normal,Extremity Movements Upper (arms, wrists, hands, fingers): None, normal Lower (legs, knees, ankles, toes): None, normal, Trunk Movements Neck, shoulders, hips: None, normal, Overall Severity Severity of abnormal movements (highest score from questions above): None, normal Incapacitation due to abnormal movements: None, normal Patient's awareness of abnormal movements (rate only patient's report): No Awareness, Dental Status Current problems with teeth and/or dentures?: No Does patient usually wear dentures?: No  CIWA:    COWS:     Musculoskeletal: Strength & Muscle Tone: within normal limits Gait & Station: normal Patient leans: N/A  Psychiatric Specialty Exam: Physical Exam  Nursing note and vitals reviewed.   Review of Systems  Psychiatric/Behavioral: The patient is nervous/anxious.   All other systems reviewed and are negative.   Blood pressure 115/85, pulse (!) 125, temperature 98.6 F (37 C), temperature source Oral, resp. rate (!) 21, height 5' 0.75" (1.543 m), weight 51.3 kg (113 lb).Body mass index is 21.53 kg/m.  General Appearance: Fairly Groomed  Eye Contact:  Fair  Speech:  Pressured  Volume:  Increased  Mood:  Anxious and Irritable, improving  Affect:  Labile  Thought Process:  Irrelevant and Descriptions of Associations:  Tangential  Orientation:  Full (Time, Place, and Person)  Thought Content:  Rumination  Suicidal Thoughts:  No  Homicidal Thoughts:  No  Memory:  Immediate;   Fair Recent;   Poor Remote;   Poor  Judgement:  Impaired  Insight:  Shallow  Psychomotor Activity:  Restlessness, more calm  Concentration:  Concentration: Fair and Attention Span: Fair  Recall:  [employer] of Knowledge:  Fair  Language:  Fair  Akathisia:  No  Handed:  Right  AIMS (if indicated):     Assets:  Communication Skills  ADL's:  Intact  Cognition:  WNL  Sleep:  Number of Hours: 6.75   Bipolar disord, crnt epsd mixed, severe, w/o psych features (HCC) unstable  Will continue today 01/11/17  plan as below except where it is noted.     Treatment Plan Summary:Patient with bipolar do , hx of noncompliance with treatment , continues to be manic, is currently on forced medications . Pt due to her lack of impulse control , being manic and labile and irritable , remains a danger to self or others. Continue IP admission.  Daily contact with patient to assess and evaluate symptoms and progress in treatment, Medication management and Plan see below Reviewed past medical records,treatment plan.  Continue Haldol 5 mg PO /IM bid for mood sx. Pt is on forced medication order - dated ( 01/07/17) - valid for 7 days . Will continue Tegretol XR 400 mg po bid for mood lability. Pending tegretol level tomorrow AM. Will continue to monitor vitals ,medication compliance and treatment side effects while patient is here.  Will monitor for medical issues as well as call consult as needed.  Pending labs - pt declined - will reorder .Reviewed labs- K= - low on admission - will repeat , get tsh, lipid panel, hba1c, pl as well as get tegretol level tomorrow AM . CSW will continue working on disposition.  Patient to participate in therapeutic milieu .      Shereda Graw, MD 01/11/2017, 3:26 PM

## 2017-01-11 NOTE — Progress Notes (Signed)
Recreation Therapy Notes  Date: 01/11/2017 Time: 10:00am Location: [address] Day[address]  Group Topic: Self-Expression  Goal Area(s) Addresses:  Patient will identify things that define who they are.  Behavioral Response: Engaged  Intervention: Art  Activity: Patients asked to identify things that define who they are by drawing pictures or writing words inside of a blank head outline. Once all patients completed their picture they were each asked to identify the top 5 things that defined them.  Education:  Self-Expression, Building control surveyorDischarge Planning.   Education Outcome: Acknowledges education  Clinical Observations/Feedback: Pt was actively engaged in activity. Pt identified being selfless, wearing matching clothes, being a good listener, dancing and being a food lover as the top 5 things that defined who she is.   Marvell Fullerachel Meyer, Recreation Therapy Intern  Caroll RancherMarjette Bryonna Sundby, LRT/CTRS

## 2017-01-11 NOTE — Progress Notes (Signed)
DAR NOTE: Patient presents with blunt affect and irritable mood.  Denies pain, auditory and visual hallucinations.  Rates depression at 0, hopelessness at 0, and anxiety at 0.  Maintained on routine safety checks.  Medications given as prescribed.  Support and encouragement offered as needed.  Attended group and participated.  States goal for today is "discharge."  Patient observed socializing with peers in the dayroom.  Offered no complaint.

## 2017-01-11 NOTE — Tx Team (Signed)
Interdisciplinary Treatment and Diagnostic Plan Update  01/11/2017 Time of Session: 10:26 AM  Yvonne [name] MRN: [id number]  Principal Diagnosis: Bipolar disord, crnt epsd mixed, severe, w/o psych features ([place])  Secondary Diagnoses: Principal Problem:   Bipolar disord, crnt epsd mixed, severe, w/o psych features ([place])   Current Medications:  Current Facility-Administered Medications  Medication Dose Route Frequency Provider Last Rate Last Dose  . acetaminophen (TYLENOL) tablet 650 mg  650 mg Oral Q6H PRN Ethelene Hal, NP      . alum & mag hydroxide-simeth (MAALOX/MYLANTA) 200-200-20 MG/5ML suspension 30 mL  30 mL Oral Q4H PRN Ethelene Hal, NP      . benztropine (COGENTIN) tablet 0.5 mg  0.5 mg Oral BH-qamhs Eappen, Saramma, MD   0.5 mg at 01/11/17 0747   Or  . benztropine mesylate (COGENTIN) injection 0.5 mg  0.5 mg Intramuscular BH-qamhs Eappen, Saramma, MD      . carbamazepine (TEGRETOL XR) 12 hr tablet 400 mg  400 mg Oral Daily Ethelene Hal, NP   400 mg at 01/11/17 0747  . feeding supplement (ENSURE ENLIVE) (ENSURE ENLIVE) liquid 237 mL  237 mL Oral BID BM Eappen, Saramma, MD      . haloperidol (HALDOL) tablet 5 mg  5 mg Oral BH-qamhs Eappen, Saramma, MD   5 mg at 01/11/17 0747   Or  . haloperidol lactate (HALDOL) injection 5 mg  5 mg Intramuscular BH-qamhs Eappen, Saramma, MD      . hydrOXYzine (ATARAX/VISTARIL) tablet 25 mg  25 mg Oral Q6H PRN Ethelene Hal, NP      . LORazepam (ATIVAN) tablet 1 mg  1 mg Oral Q6H PRN Ursula Alert, MD   1 mg at 01/06/17 1820   Or  . LORazepam (ATIVAN) injection 1 mg  1 mg Intramuscular Q6H PRN Ursula Alert, MD   1 mg at 01/10/17 1246  . magnesium hydroxide (MILK OF MAGNESIA) suspension 30 mL  30 mL Oral Daily PRN Ethelene Hal, NP      . OLANZapine [facility]) tablet 10 mg  10 mg Oral TID PRN Ursula Alert, MD       Or  . OLANZapine (ZYPREXA) injection 10 mg  10 mg Intramuscular TID PRN Ursula Alert, MD      . traZODone (DESYREL) tablet 50 mg  50 mg Oral QHS PRN Ethelene Hal, NP        PTA Medications: Prescriptions Prior to Admission  Medication Sig Dispense Refill Last Dose  . carbamazepine (TEGRETOL XR) 400 MG 12 hr tablet Take 1 tablet (400 mg total) by mouth daily. (Patient not taking: Reported on 01/02/2017) 60 tablet 1 Not Taking at Unknown time  . citalopram (CELEXA) 40 MG tablet Take 1 tablet (40 mg total) by mouth daily. (Patient not taking: Reported on 01/02/2017) 30 tablet 2 Not Taking at Unknown time    Patient Stressors: Marital or family conflict  Patient Strengths: Physical Health Religious Affiliation  Treatment Modalities: Medication Management, Group therapy, Case management,  1 to 1 session with clinician, Psychoeducation, Recreational therapy.   Physician Treatment Plan for Primary Diagnosis: Bipolar disord, crnt epsd mixed, severe, w/o psych features ([place]) Long Term Goal(s): Improvement in symptoms so as ready for discharge  Short Term Goals: Ability to identify changes in lifestyle to reduce recurrence of condition will improve Ability to verbalize feelings will improve Ability to disclose and discuss suicidal ideas Ability to demonstrate self-control will improve Ability to identify and develop effective coping behaviors will  improve Ability to maintain clinical measurements within normal limits will improve Compliance with prescribed medications will improve Ability to identify triggers associated with substance abuse/mental health issues will improve  Medication Management: Evaluate patient's response, side effects, and tolerance of medication regimen.  Therapeutic Interventions: 1 to 1 sessions, Unit Group sessions and Medication administration.  Evaluation of Outcomes: Progressing  Physician Treatment Plan for Secondary Diagnosis: Principal Problem:   Bipolar disord, crnt epsd mixed, severe, w/o psych features ([place])   Long Term  Goal(s): Improvement in symptoms so as ready for discharge  Short Term Goals: Ability to identify changes in lifestyle to reduce recurrence of condition will improve Ability to verbalize feelings will improve Ability to disclose and discuss suicidal ideas Ability to demonstrate self-control will improve Ability to identify and develop effective coping behaviors will improve Ability to maintain clinical measurements within normal limits will improve Compliance with prescribed medications will improve Ability to identify triggers associated with substance abuse/mental health issues will improve  Medication Management: Evaluate patient's response, side effects, and tolerance of medication regimen.  Therapeutic Interventions: 1 to 1 sessions, Unit Group sessions and Medication administration.  Evaluation of Outcomes: Progressing   RN Treatment Plan for Primary Diagnosis: Bipolar disord, crnt epsd mixed, severe, w/o psych features ([place]) Long Term Goal(s): Knowledge of disease and therapeutic regimen to maintain health will improve  Short Term Goals: Ability to identify and develop effective coping behaviors will improve and Compliance with prescribed medications will improve  Medication Management: RN will administer medications as ordered by provider, will assess and evaluate patient's response and provide education to patient for prescribed medication. RN will report any adverse and/or side effects to prescribing provider.  Therapeutic Interventions: 1 on 1 counseling sessions, Psychoeducation, Medication administration, Evaluate responses to treatment, Monitor vital signs and CBGs as ordered, Perform/monitor CIWA, COWS, AIMS and Fall Risk screenings as ordered, Perform wound care treatments as ordered.  Evaluation of Outcomes: Progressing    Recreational Therapy Treatment Plan for Primary Diagnosis: Bipolar disord, crnt epsd mixed, severe, w/o psych features ([place]) Long Term Goal(s):  Patient will participate in recreation therapy treatment in at least 2 group sessions without prompting from LRT  Short Term Goals: Patient will demonstrate increased ability to follow instructions, as demonstrated by ability to follow LRT instructions on first prompt during recreation therapy group sessions  Treatment Modalities: Group and Pet Therapy  Therapeutic Interventions: Psychoeducation  Evaluation of Outcomes: Progressing   LCSW Treatment Plan for Primary Diagnosis: Bipolar disord, crnt epsd mixed, severe, w/o psych features ([place]) Long Term Goal(s): Safe transition to appropriate next level of care at discharge, Engage patient in therapeutic group addressing interpersonal concerns.  Short Term Goals: Engage patient in aftercare planning with referrals and resources  Therapeutic Interventions: Assess for all discharge needs, 1 to 1 time with Social worker, Explore available resources and support systems, Assess for adequacy in community support network, Educate family and significant other(s) on suicide prevention, Complete Psychosocial Assessment, Interpersonal group therapy.  Evaluation of Outcomes: Met  Pt states she will stay in shelter, follow up [place] in Treatment: Attending groups: Yes Participating in groups: Yes Taking medication as prescribed: Yes Toleration medication: Yes, no side effects reported at this time Family/Significant other contact made:  Patient understands diagnosis: Yes AEB Discussing patient identified problems/goals with staff: Yes Medical problems stabilized or resolved: Yes Denies suicidal/homicidal ideation: Yes Issues/concerns per patient self-inventory: None Other: N/A  New problem(s) identified: Pt being seen for second opinion  as she is currently refusing meds   New Short Term/Long Term Goal(s): "Dr Darleene Cleaver told me I could be discharged today!  I only take Celexa.  I spent $8727.61 on a concert in [place] this weekend, and there's gonna be trouble if I am not at that concert. I am a homeless vet and I need you to find me housing."  Discharge Plan or Barriers: CSW will assess for appropriate discharge plan and relevant barriers.   Reason for Continuation of Hospitalization: [place]ia Medication stabilization  Estimated Length of Stay: 3-5 days; Est DC date 7/8  Attendees: Patient:  01/11/2017  10:26 AM  Physician: Ursula Alert, MD 01/11/2017  10:26 AM  Nursing: Ilda Basset, RN 01/11/2017  10:26 AM  RN Care Manager: Lars Pinks, RN 01/11/2017  10:26 AM  Social Worker: Adriana Reams, LCSW 01/11/2017  10:26 AM  Recreational Therapist: Victorino Sparrow, LRT/CTRS 01/11/2017  10:26 AM  Other:  01/11/2017  10:26 AM  Other:  01/11/2017  10:26 AM    Scribe for Treatment Team: Adriana Reams, [place] [facility] [phone]

## 2017-01-11 NOTE — [facility] Group Notes (Signed)
[facility] LCSW Group Therapy 01/11/2017 1:15 PM  Type of Therapy: Group Therapy- Feelings about Diagnosis  Participation Level: Active   Participation Quality:  Ruminative  Affect:  Appropriate  Cognitive: Alert and Oriented   Insight:  Minimal  Engagement in Therapy: Developing/Improving and Engaged   Modes of Intervention: Clarification, Confrontation, Discussion, Education, Exploration, Limit-setting, Orientation, Problem-solving, Rapport Building, Dance movement psychotherapisteality Testing, Socialization and Support  Description of Group:   This group will allow patients to explore their thoughts and feelings about diagnoses they have received. Patients will be guided to explore their level of understanding and acceptance of these diagnoses. Facilitator will encourage patients to process their thoughts and feelings about the reactions of others to their diagnosis, and will guide patients in identifying ways to discuss their diagnosis with significant others in their lives. This group will be process-oriented, with patients participating in exploration of their own experiences as well as giving and receiving support and challenge from other group members.  Summary of Progress/Problems:  Pt is ruminative about being "kept here" and how the day shift staff are "evil." Pt is focused on [place] in [place] on Friday and that her hotel as been paid up for $1000. Pt is unable to follow the conversation and is noted to be intrusive.   Therapeutic Modalities:   Cognitive Behavioral Therapy Solution Focused Therapy Motivational Interviewing Relapse Prevention Therapy  Yvonne ShanksLauren Ylianna Almanzar, LCSW 01/11/2017 2:56 PM

## 2017-01-11 NOTE — Progress Notes (Signed)
   D: Pt slept until this morning and didn't receive any hs meds. When asked about her day pt informed writer that they, the staff, made everybody leave group yesterday saying that she was disruptive." Pt stated, "I was supposed to go home and wanted to know why I didn't. If they were supposed to go they would want to know too." Pt has no questions or concerns.    A:  Support and encouragement was offered. 15 min checks continued for safety.  R: Pt remains safe.

## 2017-01-12 MED ORDER — CARBAMAZEPINE ER 200 MG PO TB12
200.0000 mg | ORAL_TABLET | Freq: Two times a day (BID) | ORAL | Status: DC
  Filled 2017-01-12 (×2): qty 1

## 2017-01-12 MED ORDER — CARBAMAZEPINE ER 200 MG PO TB12
200.0000 mg | ORAL_TABLET | Freq: Two times a day (BID) | ORAL | Status: DC
  Administered 2017-01-13 – 2017-01-14 (×3): 200 mg via ORAL
  Filled 2017-01-12 (×7): qty 1

## 2017-01-12 NOTE — Progress Notes (Addendum)
[facility] MD Progress Note  01/12/2017 1:05 PM Yvonne [name]  MRN:  [id number] Subjective:  Patient states " I am not going to take any of your medications . I am only on tegretol 200 , you put me on 400 , its making me drowsy. Once I get out of here I will stop all your medications.'    Objective:Patient seen and chart reviewed.Discussed patient with treatment team.  Pt seen as irritable , angry , labile . She continues to have pressured speech and appears manic. She has no insight in to her illness and continues to need a lot of encouragement to take medications. Pt is currently on forced medication order. Discussed with her that her tegretol can be reduced to 200 mg in divided doses and that writer has ordered a tegretol level . She has been refusing lab work ups including tegretol since the past few days, will reorder.    Principal Problem: Bipolar disord, crnt epsd mixed, severe, w/o psych features [facility]) Diagnosis:   Patient Active Problem List   Diagnosis Date Noted  . Involuntary commitment [Z04.6]   . Manic behavior (HCC) [F30.10]   . Facial cellulitis [L03.211] 02/10/2014  . Bipolar disord, crnt epsd mixed, severe, w/o psych features (HCC) [F31.63] 04/25/2013  . Bipolar disorder, unspecified (HCC) [F31.9] 01/02/2013  . MDD (major depressive disorder) [F32.9] 08/02/2012   Total Time spent with patient: 25 minutes  Past Psychiatric History: Please see H&P.   Past Medical History:  Past Medical History:  Diagnosis Date  . Bipolar 1 disorder (HCC)   . Medical history non-contributory   . Mental disorder   . No pertinent past medical history     Past Surgical History:  Procedure Laterality Date  . NO PAST SURGERIES     Family History:  Family History  Problem Relation Age of Onset  . Depression Sister   . Depression Brother   . Depression Sister   . CAD Mother   . Hypertension Father   . Diabetes Father   . Bipolar disorder Cousin    Family Psychiatric  History:  Please see H&P.  Social History: Please see H&P.  History  Alcohol Use No     History  Drug Use No    Social History   Social History  . Marital status: Single    Spouse name: N/A  . Number of children: N/A  . Years of education: N/A   Social History Main Topics  . Smoking status: Never Smoker  . Smokeless tobacco: Never Used  . Alcohol use No  . Drug use: No  . Sexual activity: No   Other Topics Concern  . None   Social History Narrative  . None   Additional Social History:                         Sleep: Fair  Appetite:  Fair  Current Medications: Current Facility-Administered Medications  Medication Dose Route Frequency Provider Last Rate Last Dose  . acetaminophen (TYLENOL) tablet 650 mg  650 mg Oral Q6H PRN Laveda Abbe, NP      . alum & mag hydroxide-simeth (MAALOX/MYLANTA) 200-200-20 MG/5ML suspension 30 mL  30 mL Oral Q4H PRN Laveda Abbe, NP      . benztropine (COGENTIN) tablet 0.5 mg  0.5 mg Oral BH-qamhs Tzirel Leonor, MD   0.5 mg at 01/12/17 0816   Or  . benztropine mesylate (COGENTIN) injection 0.5 mg  0.5 mg Intramuscular BH-qamhs  Jomarie LongsEappen, Ysela Hettinger, MD      . carbamazepine (TEGRETOL XR) 12 hr tablet 200 mg  200 mg Oral BID Granville Whitefield, MD      . feeding supplement (ENSURE ENLIVE) (ENSURE ENLIVE) liquid 237 mL  237 mL Oral BID BM Evelyne Makepeace, MD      . haloperidol (HALDOL) tablet 5 mg  5 mg Oral BH-qamhs Reegan Mctighe, MD   5 mg at 01/12/17 0816   Or  . haloperidol lactate (HALDOL) injection 5 mg  5 mg Intramuscular BH-qamhs Takyia Sindt, MD      . hydrOXYzine (ATARAX/VISTARIL) tablet 25 mg  25 mg Oral Q6H PRN Laveda AbbeParks, Laurie Britton, NP      . LORazepam (ATIVAN) tablet 1 mg  1 mg Oral Q6H PRN Jomarie LongsEappen, Chin Wachter, MD   1 mg at 01/06/17 1820   Or  . LORazepam (ATIVAN) injection 1 mg  1 mg Intramuscular Q6H PRN Jomarie LongsEappen, Shaquel Chavous, MD   1 mg at 01/10/17 1246  . magnesium hydroxide (MILK OF MAGNESIA) suspension 30 mL  30  mL Oral Daily PRN Laveda AbbeParks, Laurie Britton, NP      . OLANZapine [facility](ZYPREXA) tablet 10 mg  10 mg Oral TID PRN Jomarie LongsEappen, Anairis Knick, MD       Or  . OLANZapine (ZYPREXA) injection 10 mg  10 mg Intramuscular TID PRN Jomarie LongsEappen, Urie Loughner, MD      . propranolol (INDERAL) tablet 10 mg  10 mg Oral BID Jomarie LongsEappen, Deirdre Gryder, MD   10 mg at 01/12/17 0816  . traZODone (DESYREL) tablet 50 mg  50 mg Oral QHS PRN Laveda AbbeParks, Laurie Britton, NP        Lab Results: No results found for this or any previous visit (from the past 48 hour(s)).  Blood Alcohol level:  Lab Results  Component Value Date   ETH <5 01/02/2017   ETH <5 11/24/2016    Metabolic Disorder Labs: Lab Results  Component Value Date   HGBA1C 5.5 06/21/2012   MPG 111 06/21/2012   No results found for: PROLACTIN Lab Results  Component Value Date   CHOL 166 06/21/2012   TRIG 58 06/21/2012   HDL 58 06/21/2012   CHOLHDL 2.9 06/21/2012   VLDL 12 06/21/2012   LDLCALC 96 06/21/2012    Physical Findings: AIMS: Facial and Oral Movements Muscles of Facial Expression: None, normal Lips and Perioral Area: None, normal Jaw: None, normal Tongue: None, normal,Extremity Movements Upper (arms, wrists, hands, fingers): None, normal Lower (legs, knees, ankles, toes): None, normal, Trunk Movements Neck, shoulders, hips: None, normal, Overall Severity Severity of abnormal movements (highest score from questions above): None, normal Incapacitation due to abnormal movements: None, normal Patient's awareness of abnormal movements (rate only patient's report): No Awareness, Dental Status Current problems with teeth and/or dentures?: No Does patient usually wear dentures?: No  CIWA:    COWS:     Musculoskeletal: Strength & Muscle Tone: within normal limits Gait & Station: normal Patient leans: N/A  Psychiatric Specialty Exam: Physical Exam  Nursing note and vitals reviewed.   Review of Systems  Psychiatric/Behavioral: The patient is nervous/anxious.   All  other systems reviewed and are negative.   Blood pressure 115/85, pulse (!) 125, temperature 98.6 F (37 C), temperature source Oral, resp. rate (!) 21, height 5' 0.75" (1.543 m), weight 51.3 kg (113 lb).Body mass index is 21.53 kg/m.  General Appearance: Fairly Groomed  Eye Contact:  Fair  Speech:  Pressured  Volume:  Increased  Mood:  Anxious and Irritable   Affect:  Labile  Thought Process:  Irrelevant and Descriptions of Associations: Tangential  Orientation:  Full (Time, Place, and Person)  Thought Content:  Delusions, Paranoid Ideation and Rumination, pt is extremely paranoid , thinks her family as well as staff here are against her , she is hence a danger to self or others due to delusions as well as her manic sx   Suicidal Thoughts:  No  Homicidal Thoughts:  No  Memory:  Immediate;   Fair Recent;   Poor Remote;   Poor  Judgement:  Impaired  Insight:  Shallow  Psychomotor Activity:  Restlessness on and off   Concentration:  Concentration: Fair and Attention Span: Fair  Recall:  [employer] of Knowledge:  Fair  Language:  Fair  Akathisia:  No  Handed:  Right  AIMS (if indicated):     Assets:  Communication Skills  ADL's:  Intact  Cognition:  WNL  Sleep:  Number of Hours: 6.5   Will continue today 01/12/17 plan as below except where it is noted.     Treatment Plan Summary:Patient with bipolar do , has been noncompliant with treatment since a long time , is currently on forced medications order . She takes the medications only since she is on forced medications , and has been threatening to stop all medications on discharge . Will possibly need an LAI , which could be offered if she tolerates haldol well and does not have any ADRs to it. She has paranoid delusions that we are all against her , she is angry , loud , threatening on the unit often and has no insight in to her illness . Hence she will need continues IP admission and stay.   Daily contact with patient to  assess and evaluate symptoms and progress in treatment, Medication management and Plan see below Reviewed past medical records,treatment plan.  Continue Haldol 5 mg PO /IM bid for mood sx. Pt is on forced medication order - dated ( 01/07/17) - valid for 7 days . Will change Tegretol XR to 200 mg po bid for mood lability, reorder tegretol level tomorrow AM - PT HAS BEEN REFUSING LABS . Will continue to monitor vitals ,medication compliance and treatment side effects while patient is here.  Will monitor for medical issues as well as call consult as needed.  CSW will continue working on disposition.  Patient to participate in therapeutic milieu .      Frayda Egley, MD 01/12/2017, 1:05 PM

## 2017-01-12 NOTE — Progress Notes (Signed)
Recreation Therapy Notes  Date: 01/12/17 Time: 1000 Location: [address] Dayroom  Group Topic: Leisure Education  Goal Area(s) Addresses:  Patient will identify positive leisure activities.  Patient will identify one positive benefit of participation in leisure activities.   Behavioral Response: Engaged  Intervention: Can with strips of paper with various words, dry erase marker, dry erase board, eraser  Activity: Pictionary.  Patients were select a slip of paper from the can.  The patient was to draw what was on the paper.  The remaining patients were to guess what the drawing was.  The patient drawing the picture could not talk or give clues.  The person who guessed the picture correctly would get the next opportunity.  Education:  Leisure Education, Building control surveyorDischarge Planning  Education Outcome: Acknowledges education/In group clarification offered/Needs additional education  Clinical Observations/Feedback: Pt had to be redirected not to talk while she was doing her drawing at the board.  However, pt just couldn't keep herself from talking.  Otherwise, pt was appropriate and active during group.  Pt would give "advise" to her peers on how they could have drawn their pictures better so the group could have guessed them.  Pt stated she likes to play Taboo, monopoly, scrabble, spades and tunk with her family.  Pt also stated she would like to learn to play chess.    Caroll RancherMarjette Robey Massmann, LRT/CTRS         Caroll RancherLindsay, Sawyer Mentzer A 01/12/2017 11:24 AM

## 2017-01-12 NOTE — Progress Notes (Signed)
Patient approached this Clinical research associatewriter and stated "I don't take bedtime med. I know you are new here that's why I am letting you know so you don't come to me". This Clinical research associatewriter explained to patient that she has a force med order if she refused to take her med. Patient continue to yell and complain to every staff on duty. Patient later complied and accepted her med on seeing writer and other staff coming to her with the shot. Sleeping at this time. Will continue to monitor patient.

## 2017-01-12 NOTE — [facility] Group Notes (Signed)
[facility] LCSW Group Therapy 01/12/2017 1:15 PM  Type of Therapy: Group Therapy- Emotion Regulation  Participation Level: Monopolizing  Participation Quality:  Negative, Ruminative  Affect: Irritable  Cognitive: Alert and Oriented   Insight:  Minimal  Engagement in Therapy: Engaged   Modes of Intervention: Clarification, Confrontation, Discussion, Education, Exploration, Limit-setting, Orientation, Problem-solving, Rapport Building, Dance movement psychotherapisteality Testing, Socialization and Support  Summary of Progress/Problems: The topic for group today was emotional regulation. This group focused on both positive and negative emotion identification and allowed group members to process ways to identify feelings, regulate negative emotions, and find healthy ways to manage internal/external emotions. Group members were asked to reflect on a time when their reaction to an emotion led to a negative outcome and explored how alternative responses using emotion regulation would have benefited them. Group members were also asked to discuss a time when emotion regulation was utilized when a negative emotion was experienced. Pt continues to monopolize discussion and ruminate on not being able to leave this past Monday. Pt continues to complain about staff making her take medications she has never had before. Pt is adamant that "nothing is wring with her" and that [place] needs to [place] in [place] by Friday.    Vernie ShanksLauren Casmira Cramer, LCSW 01/12/2017 11:24 AM

## 2017-01-12 NOTE — Progress Notes (Signed)
D: Pt oriented  X3. Denies SI, HI, AVH and pain. Pt's mood remains labile. Pt is irritable, easily agitated on approach especially when  related to medications. "I don't know why you guys keep upsetting me like this, you're giving me chemicals that I don't put into my body; when I leave here, I'm not going to take all these chemicals". "I'm just ready to leave and go to [place] on Friday. Speech is still loud, rapid and pressured. Requires increased verbal encouragement to comply with medication regimen. A: Medications given as per MD's orders with verbal education. Support and encouragement offered on as need basis. Q 15 minutes safety checks maintained without self harm gestures. R: Pt attends groups as scheduled and was engaged. Compliant with medications via PO route with encouragement. Redirectable at this time. Remains safe on and off unit.

## 2017-01-12 NOTE — Progress Notes (Signed)
Adult Psychoeducational Group Note  Date:  01/12/2017 Time:  8:57 PM  Group Topic/Focus:  Wrap-Up Group:   The focus of this group is to help patients review their daily goal of treatment and discuss progress on daily workbooks.  Participation Level:  Active  Participation Quality:  Appropriate  Affect:  Appropriate  Cognitive:  Appropriate  Insight: Appropriate  Engagement in Group:  Engaged  Modes of Intervention:  Discussion  Additional Comments: The patient expressed that she rates today a 10.the patient also said that she is ready for discharge.  Octavio Mannshigpen, Tyger Oka Lee 01/12/2017, 8:57 PM

## 2017-01-13 NOTE — Progress Notes (Signed)
D: Pt at the time of assessment observed interacting with peers in the dayroom. Pt denied pain, depression, anxiety, SI, HI or AVH; "you know there is nothing wrong with me; I am a veteran and homeless; all I need is a place to stay." Pt remained hyper-verbal however; cooperative.  A: Medications offered as prescribed. All patient's questions and concerns addressed. Support, encouragement, and safe environment provided. 15-minute safety checks continue. R: Pt was med compliant "I am only taking these medications because I want to leave here tomorrow; my doctor did not prescribe what you guys are giving me." Pt attended wrap-up group. Safety checks continue.

## 2017-01-13 NOTE — Progress Notes (Signed)
Recreation Therapy Notes  Date: 01/13/2017 Time: 10:00am Location: 500 [address] Dayroom  Group Topic: Leisure and Lifestyle Education  Goal Area(s) Addresses:  Patient will be able to successfully identify solutions to barriers they have experienced when they have tried to participate in leisure activities. Patient will be able to link the solutions to barriers they face in their everyday lives.  Behavioral Response: Engaged  Intervention: Scientist, clinical (histocompatibility and immunogenetics)Construction Paper, Scissors, Pencils  Activity: Pt will cut red construction paper into bricks and put one barrier they have faced while trying to participate in a leisure activity on 3-4 cut out bricks. Pt will then identify solutions to the barriers they have faced.  Education:  Leisure and Lifestyle Education, Discharge Planning  Education Outcome: Acknowledges education  Clinical Observations/Feedback: Pt actively participated in group by stating she enjoys shopping, watching movies and going to restaurants. Pt identified having her family turning their back to her was the only barrier that she had to overcome to participate in leisure activities. Pt helped other pts identify solutions to their barriers such as learning about other cultures, walking as a mode of transportation, detoxing for substance abuse and making sure you take your medication to cope with their anxiety.   Yvonne Fullerachel [name], Recreation Therapy Intern  Yvonne RancherMarjette Max [name], LRT/CTRS   Yvonne FullerRachel [name] 01/13/2017 11:59 AM

## 2017-01-13 NOTE — [facility] Group Notes (Signed)
[facility] [employer] Group Therapy 01/13/2017 1:15pm  Type of Therapy: [employer] Presentation  Participation Level: Active  Participation Quality: Attentive  Affect: Appropriate  Cognitive: Oriented  Insight: Developing/Improving  Engagement in Therapy: Engaged  Modes of Intervention: Discussion, Education and Socialization  Summary of Progress/Problems: Mental [employer] (MHA) Speaker came to talk about his personal journey with substance abuse and addiction. The pt processed ways by which to relate to the speaker. MHA speaker provided handouts and educational information pertaining to groups and services offered by the MHA. Pt was engaged in speaker's presentation and was receptive to resources provided.    Magaline Steinberg, LCSW 01/13/2017 1:30 PM  

## 2017-01-13 NOTE — Progress Notes (Signed)
CSW placed calls to multiple [facility], and [facility] [place]. No [place] beds available at any facilities. [facility] reports that they may have a bed on Monday.  Vernie ShanksLauren Folashade Gamboa, LCSW Clinical Social Work [phone]

## 2017-01-13 NOTE — Progress Notes (Signed)
[facility] MD Progress Note  01/13/2017 5:21 PM Yvonne [name]  MRN:  [id number]  Subjective: Yvonne [name] reports, "I'm upset at you, the doctor with the black hair (Dr Janalee Dane), what ever that you all are cooking up today, I don't care because I'm going home tomorrow. The SW worker Leotis Shames is making a phone call right now to secure a bed for me at the Shelter. I'm only gonna stay at the shelter x 1 night, then I will get on the bus to [place] the next night. I have already booked this trip with a $1,000.00 for the [employer]. I want to see [place] & beyonce. If I did not get discharged & I miss this trip, I'm gonna lose a $1,000.00. This money is not refundable. You keep trying to make me take shots. I don't take shots. I'm not going to take shots. I only take the haldog medicine you all are giving me so that I can get out of here. I only take Tegretol 200 mg. My [name] put me on that. I'm not a patient here, I'm only a homeless veteran, damn it".  Objective: Patient seen and chart reviewed. Discussed patient with treatment team. Patient remains manic, talkative, angry, irirtated & labile. She continues to have pressured speech & presents with angry outburst. She has no insight into her illness and continues to need a lot of encouragement to take medications. Pt is currently on forced medication order. The attending psychiatrist has discussed with her that herTegretol can be reduced to 200 mg in divided doses and has ordered a tegretol level . She has been refusing lab work ups including the tegretol levels since the past few days. This has been reordered. She remains delusional & grandiose.  Principal Problem: Bipolar disord, crnt epsd mixed, severe, w/o psych features [facility]) Diagnosis:   Patient Active Problem List   Diagnosis Date Noted  . Involuntary commitment [Z04.6]   . Manic behavior (HCC) [F30.10]   . Facial cellulitis [L03.211] 02/10/2014  . Bipolar disord, crnt epsd mixed, severe, w/o  psych features (HCC) [F31.63] 04/25/2013  . Bipolar disorder, unspecified (HCC) [F31.9] 01/02/2013  . MDD (major depressive disorder) [F32.9] 08/02/2012   Total Time spent with patient: 25 minutes  Past Psychiatric History: Please see H&P.   Past Medical History:  Past Medical History:  Diagnosis Date  . Bipolar 1 disorder (HCC)   . Medical history non-contributory   . Mental disorder   . No pertinent past medical history     Past Surgical History:  Procedure Laterality Date  . NO PAST SURGERIES     Family History:  Family History  Problem Relation Age of Onset  . Depression Sister   . Depression Brother   . Depression Sister   . CAD Mother   . Hypertension Father   . Diabetes Father   . Bipolar disorder Cousin    Family Psychiatric  History: Please see H&P.  Social History: Please see H&P.  History  Alcohol Use No     History  Drug Use No    Social History   Social History  . Marital status: Single    Spouse name: N/A  . Number of children: N/A  . Years of education: N/A   Social History Main Topics  . Smoking status: Never Smoker  . Smokeless tobacco: Never Used  . Alcohol use No  . Drug use: No  . Sexual activity: No   Other Topics Concern  . None  Social History Narrative  . None   Additional Social History:   Sleep: Fair  Appetite:  Fair  Current Medications: Current Facility-Administered Medications  Medication Dose Route Frequency Provider Last Rate Last Dose  . acetaminophen (TYLENOL) tablet 650 mg  650 mg Oral Q6H PRN Laveda AbbeParks, Laurie Britton, NP      . alum & mag hydroxide-simeth (MAALOX/MYLANTA) 200-200-20 MG/5ML suspension 30 mL  30 mL Oral Q4H PRN Laveda AbbeParks, Laurie Britton, NP      . benztropine (COGENTIN) tablet 0.5 mg  0.5 mg Oral BH-qamhs Eappen, Levin BaconSaramma, MD   0.5 mg at 01/13/17 0809   Or  . benztropine mesylate (COGENTIN) injection 0.5 mg  0.5 mg Intramuscular BH-qamhs Eappen, Levin BaconSaramma, MD      . carbamazepine (TEGRETOL XR) 12 hr  tablet 200 mg  200 mg Oral BID Eappen, Levin BaconSaramma, MD   200 mg at 01/13/17 1719  . haloperidol (HALDOL) tablet 5 mg  5 mg Oral BH-qamhs Eappen, Saramma, MD   5 mg at 01/13/17 [id number]   Or  . haloperidol lactate (HALDOL) injection 5 mg  5 mg Intramuscular BH-qamhs Eappen, Saramma, MD      . hydrOXYzine (ATARAX/VISTARIL) tablet 25 mg  25 mg Oral Q6H PRN Laveda AbbeParks, Laurie Britton, NP      . LORazepam (ATIVAN) tablet 1 mg  1 mg Oral Q6H PRN Jomarie LongsEappen, Saramma, MD   1 mg at 01/13/17 1430   Or  . LORazepam (ATIVAN) injection 1 mg  1 mg Intramuscular Q6H PRN Jomarie LongsEappen, Saramma, MD   1 mg at 01/10/17 1246  . magnesium hydroxide (MILK OF MAGNESIA) suspension 30 mL  30 mL Oral Daily PRN Laveda AbbeParks, Laurie Britton, NP      . OLANZapine [facility] [place](ZYPREXA) tablet 10 mg  10 mg Oral TID PRN Jomarie LongsEappen, Saramma, MD       Or  . OLANZapine (ZYPREXA) injection 10 mg  10 mg Intramuscular TID PRN Jomarie LongsEappen, Saramma, MD      . propranolol (INDERAL) tablet 10 mg  10 mg Oral BID Jomarie LongsEappen, Saramma, MD   10 mg at 01/13/17 0807  . traZODone (DESYREL) tablet 50 mg  50 mg Oral QHS PRN Laveda AbbeParks, Laurie Britton, NP        Lab Results: No results found for this or any previous visit (from the past 48 hour(s)).  Blood Alcohol level:  Lab Results  Component Value Date   ETH <5 01/02/2017   ETH <5 11/24/2016    Metabolic Disorder Labs: Lab Results  Component Value Date   HGBA1C 5.5 06/21/2012   MPG 111 06/21/2012   No results found for: PROLACTIN Lab Results  Component Value Date   CHOL 166 06/21/2012   TRIG 58 06/21/2012   HDL 58 06/21/2012   CHOLHDL 2.9 06/21/2012   VLDL 12 06/21/2012   LDLCALC 96 06/21/2012    Physical Findings: AIMS: Facial and Oral Movements Muscles of Facial Expression: None, normal Lips and Perioral Area: None, normal Jaw: None, normal Tongue: None, normal,Extremity Movements Upper (arms, wrists, hands, fingers): None, normal Lower (legs, knees, ankles, toes): None, normal, Trunk Movements Neck, shoulders, hips: None,  normal, Overall Severity Severity of abnormal movements (highest score from questions above): None, normal Incapacitation due to abnormal movements: None, normal Patient's awareness of abnormal movements (rate only patient's report): No Awareness, Dental Status Current problems with teeth and/or dentures?: No Does patient usually wear dentures?: No  CIWA:    COWS:     Musculoskeletal: Strength & Muscle Tone: within normal limits Gait &  Station: normal Patient leans: N/A  Psychiatric Specialty Exam: Physical Exam  Nursing note and vitals reviewed.   Review of Systems  Psychiatric/Behavioral: The patient is nervous/anxious.   All other systems reviewed and are negative.   Blood pressure 125/81, pulse 91, temperature 98.7 F (37.1 C), resp. rate 18, height 5' 0.75" (1.543 m), weight 51.3 kg (113 lb).Body mass index is 21.53 kg/m.  General Appearance: Fairly Groomed  Eye Contact:  Fair  Speech:  Pressured  Volume:  Increased  Mood:  Anxious and Irritable   Affect:  Labile  Thought Process:  Irrelevant and Descriptions of Associations: Tangential  Orientation:  Full (Time, Place, and Person)  Thought Content:  Delusions, Paranoid Ideation and Rumination, pt is extremely paranoid , thinks her family as well as staff here are against her , she is hence a danger to self or others due to delusions as well as her manic sx   Suicidal Thoughts:  No  Homicidal Thoughts:  No  Memory:  Immediate;   Fair Recent;   Poor Remote;   Poor  Judgement:  Impaired  Insight:  Shallow  Psychomotor Activity:  Restlessness on and off   Concentration:  Concentration: Fair and Attention Span: Fair  Recall:  [employer] of Knowledge:  Fair  Language:  Fair  Akathisia:  No  Handed:  Right  AIMS (if indicated):     Assets:  Communication Skills  ADL's:  Intact  Cognition:  WNL  Sleep:  Number of Hours: 6   Will continue today 01/13/17 plan as below except where it is noted.  Treatment Plan  Summary: Patient with bipolar do , has been noncompliant with treatment since a long time , is currently on forced medications order . She takes the medications only since she is on forced medications , and has been threatening to stop all medications on discharge . Will possibly need an LAI , which could be offered if she tolerates haldol well and does not have any ADRs to it. She has paranoid delusions that we are all against her, she is angry, loud , threatening on the unit often and has no insight in to her illness . Hence she will need continues IP admission and stay.  Daily contact with patient to assess and evaluate symptoms and progress in treatment, Medication management and Plan see below Reviewed past medical records,treatment plan.  Continue Haldol 5 mg PO /IM bid for mood sx. Pt is on forced medication order - dated ( 01/07/17) - valid for 7 days . ContinueTegretol XR 200 mg po bid for mood lability, reordered tegretol level tomorrow AM - PT HAS BEEN REFUSING LABS .  Will continue to monitor vitals signs, medication   compliance and treatment side effects while patient is here.   Will monitor for medical issues as well as call consult as needed.   CSW will continue working on disposition.   Patient to participate in therapeutic milieu .   [name], NP, PMHNP, FNP-BC. 01/13/2017, 5:21 PMPatient ID: [name], female   DOB: [birth year], 51 y.o.   MRN: [id number]

## 2017-01-13 NOTE — Progress Notes (Signed)
DAR NOTE: Patient presents with irritable affect and mood lability.  Denies pain, auditory and visual hallucinations.  Denies depression, anxiety, and hopelessness.  Patient continues to be loud on the unit and is disruptive in milieu.  Easily agitated when redirected on the unit.  Patient became more agitated when told she could not discharge today.  Patient became verbally aggressive towards staff.  Patient received Ativan 1 mg for severe agitation with good effect.   Rates depression at 0, hopelessness at 0, and anxiety at 0.  Maintained on routine safety checks.  Medications given as prescribed.  Support and encouragement offered as needed.  Attended group and participated.  States goal for today is "discharge."

## 2017-01-13 NOTE — Progress Notes (Signed)
[facility] Group Notes:  (Nursing/MHT/Case Management/Adjunct)  Date:  01/13/2017  Time:  9:16 PM  Type of Therapy:  Psychoeducational Skills  Participation Level:  Active  Participation Quality:  Appropriate  Affect:  Angry and Anxious  Cognitive:  Disorganized  Insight:  Lacking  Engagement in Group:  Monopolizing  Modes of Intervention:  Education  Summary of Progress/Problems: Patient states that she had a bad day since she was not discharged earlier in the day. She states that she was supposed to have been discharged on Monday and that the staff keeps changing their mind every day. She said [place]. Her goal for tomorrow is to get discharged.   Yvonne [name], Yvonne [name] 01/13/2017, 9:16 PM

## 2017-01-14 MED ORDER — CARBAMAZEPINE ER 200 MG PO TB12: 200.0000 mg | ORAL_TABLET | Freq: Two times a day (BID) | ORAL | 0 refills | Status: DC

## 2017-01-14 MED ORDER — PROPRANOLOL HCL 10 MG PO TABS: 10.0000 mg | ORAL_TABLET | Freq: Two times a day (BID) | ORAL | 0 refills | Status: DC

## 2017-01-14 MED ORDER — TRAZODONE HCL 50 MG PO TABS: 50.0000 mg | ORAL_TABLET | Freq: Every evening | ORAL | 0 refills | Status: DC | PRN

## 2017-01-14 MED ORDER — HYDROXYZINE HCL 25 MG PO TABS: 25.0000 mg | ORAL_TABLET | Freq: Four times a day (QID) | ORAL | 0 refills | Status: DC | PRN

## 2017-01-14 NOTE — [facility] Suicide Risk Assessment (Addendum)
[facility] Discharge Suicide Risk Assessment   Principal Problem: Bipolar disord, crnt epsd mixed, severe, w/o psych features [facility]) Discharge Diagnoses:  Patient Active Problem List   Diagnosis Date Noted  . Involuntary commitment [Z04.6]   . Manic behavior (HCC) [F30.10]   . Facial cellulitis [L03.211] 02/10/2014  . Bipolar disord, crnt epsd mixed, severe, w/o psych features (HCC) [F31.63] 04/25/2013  . Bipolar disorder, unspecified (HCC) [F31.9] 01/02/2013  . MDD (major depressive disorder) [F32.9] 08/02/2012   Patient is a 51 year old female transferred on an involuntary petition from [place] long ED department for increased dangerous disruptive behaviors and being combative per [facility]  Patient continues to refuse medications, his hypomanic, can be loud at times but does not meet criteria for forced medications. Patient is not psychotic, is sleeping at night, is not grandiose, her thought processes are circumstantial. Patient is loud at times but refuses treatment and cannot be forced to take medications but she is agreed to taking Tegretol XR200 mg twice daily  Patient is not suicidal, homicidal, is not hallucinating, is not paranoid. Total Time spent with patient: 30 minutes  Musculoskeletal: Strength & Muscle Tone: within normal limits Gait & Station: normal Patient leans: N/A  Psychiatric Specialty Exam: Review of Systems  Constitutional: Negative.  Negative for fever and malaise/fatigue.  HENT: Negative.  Negative for congestion and sore throat.   Eyes: Negative.  Negative for blurred vision and double vision.  Respiratory: Negative.  Negative for cough, shortness of breath and wheezing.   Cardiovascular: Negative.  Negative for chest pain and palpitations.  Gastrointestinal: Negative.  Negative for abdominal pain, constipation, diarrhea, heartburn, nausea and vomiting.  Musculoskeletal: Negative.  Negative for back pain, falls, joint pain and myalgias.   Neurological: Negative.  Negative for dizziness, seizures, loss of consciousness, weakness and headaches.  Endo/Heme/Allergies: Negative.  Negative for environmental allergies.  Psychiatric/Behavioral: Negative.  Negative for depression, hallucinations, memory loss, substance abuse and suicidal ideas. The patient is not nervous/anxious and does not have insomnia.     Blood pressure 114/78, pulse 93, temperature 98.9 F (37.2 C), temperature source Oral, resp. rate 16, height 5' 0.75" (1.543 m), weight 51.3 kg (113 lb).Body mass index is 21.53 kg/m.  General Appearance: Casual  Eye Contact::  Good  Speech:  Clear and Coherent and Normal Rate409  Volume:  Normal  Mood:  Euthymic  Affect:  Congruent and Full Range  Thought Process:  Goal Directed and Descriptions of Associations: Intact  Orientation:  Full (Time, Place, and Person)  Thought Content:  WDL  Suicidal Thoughts:  No  Homicidal Thoughts:  No  Memory:  Immediate;   Fair Recent;   Fair Remote;   Fair  Judgement:  Impaired  Insight:  Shallow  Psychomotor Activity:  Normal  Concentration:  Fair  Recall:  [employer] of Knowledge:Fair  Language: Fair  Akathisia:  No  Handed:  Right  AIMS (if indicated):     Assets:  Communication Skills Physical Health  Sleep:  Number of Hours: 6.25  Cognition: WNL  ADL's:  Intact   Mental Status Per Nursing Assessment::   On Admission:  NA  Demographic Factors:  Low socioeconomic status  Loss Factors: NA  Historical Factors: Family history of mental illness or substance abuse and Impulsivity  Risk Reduction Factors:   Religious beliefs about death  Continued Clinical Symptoms:  More than one psychiatric diagnosis Previous Psychiatric Diagnoses and Treatments  Cognitive Features That Contribute To Risk:  Closed-mindedness    Suicide Risk:  Minimal: No identifiable suicidal ideation.  Patients presenting with no risk factors but with morbid ruminations; may be classified  as minimal risk based on the severity of the depressive symptoms  Follow-up Information    [facility] Follow up.        [facility] Follow up on 01/19/2017.   Why:  Wednesday at 12:40 with Dr Jannifer FranklinAkintayo.  This is your last appointment with them until you are able to take care of your outstanding bill there Contact information: [address] [place] [address] [phone]        [facility]  Follow up.   Contact information: [address] [place] [address] [phone]           Plan Of Care/Follow-up recommendations:  Activity:  As tolerated Diet:  Regular Other:  Keep follow-up appointments and take medications as prescribed  Nelly RoutKUMAR,Daveon Arpino, MD 01/14/2017, 12:14 PM

## 2017-01-14 NOTE — Progress Notes (Signed)
Patient discharged to lobby. Patient was stable and appreciative at that time. All papers and prescriptions were given and valuables returned. Verbal understanding expressed. Denies SI/HI and A/VH. Patient given opportunity to express concerns and ask questions.  

## 2017-01-14 NOTE — Discharge Summary (Signed)
Physician Discharge Summary Note  Patient:  Yvonne [name] is an 51 y.o., female MRN:  [id number] DOB:  1965-10-11 Patient phone:  [phone] (home)  Patient address:   [place] [address],   Total Time spent with patient: Greater than 30 minutes  Date of Admission:  01/05/2017 Date of Discharge: 01-14-17  Reason for Admission: Worsening symptoms of Bipolar disorder, mixed episode.  Principal Problem: Bipolar disord, crnt epsd mixed, severe, w/o psych features [facility])  Discharge Diagnoses: Patient Active Problem List   Diagnosis Date Noted  . Involuntary commitment [Z04.6]   . Manic behavior (HCC) [F30.10]   . Facial cellulitis [L03.211] 02/10/2014  . Bipolar disord, crnt epsd mixed, severe, w/o psych features (HCC) [F31.63] 04/25/2013  . Bipolar disorder, unspecified (HCC) [F31.9] 01/02/2013  . MDD (major depressive disorder) [F32.9] 08/02/2012   Past Psychiatric History: Bipolar affective disorder.  Past Medical History:  Past Medical History:  Diagnosis Date  . Bipolar 1 disorder (HCC)   . Medical history non-contributory   . Mental disorder   . No pertinent past medical history     Past Surgical History:  Procedure Laterality Date  . NO PAST SURGERIES     Family History:  Family History  Problem Relation Age of Onset  . Depression Sister   . Depression Brother   . Depression Sister   . CAD Mother   . Hypertension Father   . Diabetes Father   . Bipolar disorder Cousin    Family Psychiatric  History: See H&P Social History:  History  Alcohol Use No     History  Drug Use No    Social History   Social History  . Marital status: Single    Spouse name: N/A  . Number of children: N/A  . Years of education: N/A   Social History Main Topics  . Smoking status: Never Smoker  . Smokeless tobacco: Never Used  . Alcohol use No  . Drug use: No  . Sexual activity: No   Other Topics Concern  . None   Social History Narrative  . None    Hospital Course:  Yvonne Jonesis an 51 y.o.female, Admitted involuntarily emergently from [facility] long emergency department for increased dangerous, disruptive and combative behaviors as per [employer]. Patient presented with severe symptoms of bipolar mania, irritability, agitation, aggressive behavior, delusional thoughts, disorganized, intrusive and impulsive. Patient is also very poor historian, repeats herself several times saying that she is a veteran, homeless and cannot be placed on the streets and she does not need to see in hospital she needed to be put in a motel by CSW. Patient stated that Dr. Isidoro Donning was treated her in the past and he knows everything about her family and he told her she is coming to the hospital to be discharged today. Patient stated she cannot stay here because she has a multiple plans of going to [place] and also out-of-state travel but does not give any specific way of transportation and who she is going to travel etc. Patient reported she used to do shopping sprees and coming out of control. Patient stated that she learned about another patient in the unit who cannot stay by herself without tenderness support so she is planning to move in with her so that she will have a place of her own. Reportedly patient was at sister's home and has a bizarre behaviors that urinating in the street and refusing to leave assistance home. Patient was recently admitted to [facility] on 12/01/2016 which  she stayed about 10 days. Patient continued to have pressured speech, racing thoughts, tangential and circumstantial and aggressive body language. Patient has a limited insight, judgment and impulse controls.  Since her arrival to the [facility] adult unit & with patient bluntly refusing to take any medications but Tegretol XR, Wendie's symptoms & aggressive behavior continued to escalate. This is evidenced by her continued aggressiveness, irritability, agitations, restlessness, delusional thoughts,  talkativeness & often grandiose. She was kept on a do not admit room to assure her safety, privacy & that of the other patients. Staff witnessed & reported periods of grandiosity on the part of the patient & continued to provide redirection to reality. She did not believe that she is mentally ill. She did not like to be referred or treated like a patient as she believed that she is here because she is a homeless veteran & not mentally ill.  On Thursday, 01-13-17, patient was observed increasingly irritable, loud & aggressive as she demanded to be discharged as she had a planned trip to [place] that she could not miss. Staff continued to provide patient with ongoing supervision, assessments & support. No injuries noted on the part of patient or staff throughout her aggressive ordeal.   Prior to discharge, Ramah was on forced medication. She continued to refuse medications, she remained hypomanic, can be loud at times but does not meet criteria for forced medications any longer. Patient is not psychotic, is sleeping at night, her thought processes are circumstantial. Patient is loud at times but refused all the treatment recommendations by her treatment team. At this time, she will not benfit from being forced to take medications, but has agreed to taking Tegretol XR 200 mg twice daily. She seem to favor her outpatient psychiatrist & is willing to accept his treatment recommendation from here on.  Patient at this time is not suicidal, homicidal, is not hallucinating, is not paranoid. She is currently being discharged to continue mental health care on an outpatient basis as noted below. She is provided with all the necessary information needed to make this appointment without problems. She was provided with a 7 days worth supply samples of her [facility] discharge medications. She left [facility] with all personal belongings in no apparent distress. Transportation per city bus, [facility] assisted with bus pass.  Physical  Findings: AIMS: Facial and Oral Movements Muscles of Facial Expression: None, normal Lips and Perioral Area: None, normal Jaw: None, normal Tongue: None, normal,Extremity Movements Upper (arms, wrists, hands, fingers): None, normal Lower (legs, knees, ankles, toes): None, normal, Trunk Movements Neck, shoulders, hips: None, normal, Overall Severity Severity of abnormal movements (highest score from questions above): None, normal Incapacitation due to abnormal movements: None, normal Patient's awareness of abnormal movements (rate only patient's report): No Awareness, Dental Status Current problems with teeth and/or dentures?: No Does patient usually wear dentures?: No  CIWA:    COWS:     Musculoskeletal: Strength & Muscle Tone: within normal limits Gait & Station: normal Patient leans: N/A  Psychiatric Specialty Exam: Physical Exam  Constitutional: She appears well-developed.  HENT:  Head: Normocephalic.  Eyes: Pupils are equal, round, and reactive to light.  Neck: Normal range of motion.  Cardiovascular: Normal rate.   Respiratory: Effort normal.  Genitourinary:  Genitourinary Comments: Deferred  Musculoskeletal: Normal range of motion.  Neurological: She is alert.  Skin: Skin is warm.    Review of Systems  Constitutional: Negative.   HENT: Negative.   Eyes: Negative.   Respiratory: Negative.  Cardiovascular: Negative.   Gastrointestinal: Negative.   Genitourinary: Negative.   Musculoskeletal: Negative.   Skin: Negative.   Neurological: Negative.   Endo/Heme/Allergies: Negative.   Psychiatric/Behavioral: Positive for depression and hallucinations (Hx. Psychosis, mania). Negative for memory loss, substance abuse and suicidal ideas. The patient has insomnia (Stable). The patient is not nervous/anxious.     Blood pressure 114/78, pulse 93, temperature 98.9 F (37.2 C), temperature source Oral, resp. rate 16, height 5' 0.75" (1.543 m), weight 51.3 kg (113 lb).Body  mass index is 21.53 kg/m.  See Md's SRA   Have you used any form of tobacco in the last 30 days? (Cigarettes, Smokeless Tobacco, Cigars, and/or Pipes): No  Has this patient used any form of tobacco in the last 30 days? (Cigarettes, Smokeless Tobacco, Cigars, and/or Pipes)No  Blood Alcohol level:  Lab Results  Component Value Date   [facility] <5 01/02/2017   ETH <5 11/24/2016    Metabolic Disorder Labs:  Lab Results  Component Value Date   HGBA1C 5.5 06/21/2012   MPG 111 06/21/2012   No results found for: PROLACTIN Lab Results  Component Value Date   CHOL 166 06/21/2012   TRIG 58 06/21/2012   HDL 58 06/21/2012   CHOLHDL 2.9 06/21/2012   VLDL 12 06/21/2012   LDLCALC 96 06/21/2012   See Psychiatric Specialty Exam and Suicide Risk Assessment completed by Attending Physician prior to discharge.  Discharge destination:  Home  Is patient on multiple antipsychotic therapies at discharge:  No   Has Patient had three or more failed trials of antipsychotic monotherapy by history:  No  Recommended Plan for Multiple Antipsychotic Therapies: NA  Allergies as of 01/14/2017   No Known Allergies     Medication List    STOP taking these medications   citalopram 40 MG tablet Commonly known as:  CELEXA     TAKE these medications     Indication  carbamazepine 200 MG 12 hr tablet Commonly known as:  TEGRETOL XR Take 1 tablet (200 mg total) by mouth 2 (two) times daily. For mood stabilization What changed:  medication strength  how much to take  when to take this  additional instructions  Indication:  Mood stabilization   hydrOXYzine 25 MG tablet Commonly known as:  ATARAX/VISTARIL Take 1 tablet (25 mg total) by mouth every 6 (six) hours as needed for anxiety.  Indication:  Anxiety Neurosis   propranolol 10 MG tablet Commonly known as:  INDERAL Take 1 tablet (10 mg total) by mouth 2 (two) times daily. For anxiety  Indication:  Anxiety   traZODone 50 MG tablet Commonly  known as:  DESYREL Take 1 tablet (50 mg total) by mouth at bedtime as needed for sleep.  Indication:  Trouble Sleeping      Follow-up Information    [facility].        [facility] on 01/19/2017.   Why:  Wednesday at 12:40 with Dr Jannifer Franklin.  This is your last appointment with them until you are able to take care of your outstanding bill there Contact information: [address] [place] [address] [phone]        [facility]  Follow up.   Contact information: [address] [place] [address] [phone]          Follow-up recommendations:  Activity:  As tolerated Diet: As recommended by your primary care doctor. Keep all scheduled follow-up appointments as recommended.  Comments:  Patient is instructed prior to discharge to: Take all medications as prescribed by his/her mental healthcare provider. Report any adverse effects and or reactions from the medicines to his/her outpatient provider promptly. Patient has been instructed & cautioned: To not engage in alcohol and or illegal drug use while on prescription medicines. In the event of worsening symptoms, patient is instructed to call the crisis hotline, 911 and or go to the nearest ED for appropriate evaluation and treatment of symptoms. To follow-up with his/her primary care provider for your other medical issues, concerns and or health care needs.   Signed: Sanjuana KavaNwoko, Agnes I, NP, PMHNP, FNP-BC 01/14/2017, 12:24 PM

## 2017-01-14 NOTE — Progress Notes (Signed)
Recreation Therapy Notes  INPATIENT RECREATION TR PLAN  Patient Details Name: [name] MRN: [id number] DOB: [birth year] Today's Date: 01/14/2017  Rec Therapy Plan Is patient appropriate for Therapeutic Recreation?: Yes Treatment times per week: about 3 days Estimated Length of Stay: 5-7 days TR Treatment/Interventions: Group participation (Comment)  Discharge Criteria Pt will be discharged from therapy if:: Discharged Treatment plan/goals/alternatives discussed and agreed upon by:: Patient/family  Discharge Summary Short term goals set: Patient will attend and participate in Recreation Therapy Group Sessions  Short term goals met: Complete Progress toward goals comments: Groups attended Which groups?: Communication, Coping skills, Leisure education, Other (Comment) (Self expression) Reason goals not met: None Therapeutic equipment acquired: N/A Reason patient discharged from therapy: Discharge from hospital Pt/family agrees with progress & goals achieved: Yes Date patient discharged from therapy: 01/14/17   Victorino Sparrow, LRT/CTRS  Ria Comment, [place] 01/14/2017, 12:35 PM

## 2017-01-14 NOTE — Progress Notes (Signed)
Recreation Therapy Notes  Date: 01/14/17 Time: 1000 Location: 500 Hall Dayroom  Group Topic: Communication, Team Building, Problem Solving  Goal Area(s) Addresses:  Patient will effectively work with peer towards shared goal.  Patient will identify skill used to make activity successful.  Patient will identify how skills used during activity can be used to reach post d/c goals.   Behavioral Response: Minimal  Intervention: STEM Activity   Activity: [place]. Patients were provided the following materials: 5 drinking straws, 5 rubber bands, 5 paper clips, 2 index cards, 2 drinking cups, and 2 toilet paper rolls. Using the provided materials patients were asked to build a launching mechanisms to launch a ping pong ball approximately 12 feet. Patients were divided into teams of 3-5.   Education: Pharmacist, communityocial Skills, Building control surveyorDischarge Planning.   Education Outcome: Acknowledges education/In group clarification offered/Needs additional education.   Clinical Observations/Feedback: Pt mainly observed but eventually did offer assistance to one of the groups.  Pt expressed the group had to use creativity to complete the activity.  When asked how creativity was used, pt stated "they just needed to use the cups and rubber bands, you tricked them by giving them all the other stuff".  Pt had to be redirected during processing because she and another pt were having words with each other.    Caroll RancherMarjette Dajanee Voorheis, LRT/CTRS         Lillia AbedLindsay, Dino Borntreger A 01/14/2017 11:58 AM

## 2017-01-14 NOTE — Progress Notes (Signed)
Patient verbalizes concern of not letting her leave today. Stated she has paid $1500.00 for her stay in a hotel and she is suppose to [place] in [place] tomorrow, if not will loose big amount of money. Patient stated she will sue the hospital for that. Complained about Haldol and many things. Denies pain, SI/HI, AH/VH at this time. Sleeping at this time. Will continue to monitor patient.

## 2017-01-14 NOTE — Progress Notes (Signed)
  [facility] Discharge Plan :  Will you be returning to the same living situation after discharge:  No. At discharge, do you have transportation home?: Yes,  patient given bus pass and PART ticket for transport Do you have the ability to pay for your medications: Yes,  Patient reports she [facility] for [facility]  Release of information consent forms completed and in the chart;  Patient's signature needed at discharge.  Patient to Follow up at: Follow-up Information    [facility].        [facility] Follow up on 01/19/2017.   Why:  Wednesday at 12:40 with Dr Jannifer FranklinAkintayo.  This is your last appointment with them until you are able to take care of your outstanding bill there Contact information: [address] [place] [address] [phone]        [facility] Follow up.   Contact information: [address] [place] [address] [phone]           Next level of care provider has access to [facility] Link:yes  Safety Planning and Suicide Prevention discussed: Yes,  family and patient  Have you used any form of tobacco in the last 30 days? (Cigarettes, Smokeless Tobacco, Cigars, and/or Pipes): No  Has patient been referred to the Quitline?: Patient refused referral  Patient has been referred for addiction treatment: N/A   Patient refusing to take any medications and demanding to leave. She has been made aware that there are no shelters available until Monday. She reports she will figure out and will need a letter for Leslie's house on Monday.    LCSW will provide letter regarding homelessness per request of patient.  Patient will be responsible regarding finding shelter in which she is aware and agreeable. She reported this to Dr. Lucianne MussKumar and Clinical research associatewriter.  Patient declines [facility] or [facility].  She reports her doctor is Dr. Jannifer FranklinAkintayo and she will call him and make  appointment once she can pay her balance, but her [facility].    Raye SorrowCoble, [place] Ndiaye N 01/14/2017, 10:54 AM

## 2017-01-14 NOTE — Plan of Care (Signed)
Problem: [facility] Participation in Recreation Therapeutic Interventions Goal: STG-Patient will attend/participate in Rec Therapy Group Ses STG-The Patient will attend and participate in Recreation Therapy Group Sessions  Outcome: Completed/Met Date Met: 01/14/17 Pt attended and participated in communication, leisure education, self expression and coping skills recreation therapy sessions.  Yvonne [name], LRT/CTRS

## 2017-01-24 ENCOUNTER — Emergency Department (HOSPITAL_COMMUNITY)
Admission: EM | Admit: 2017-01-24 | Discharge: 2017-01-25 | Disposition: A | Discharge status: Home / Self Care | Payer: 59 | Attending: Emergency Medicine | Admitting: Emergency Medicine

## 2017-01-24 ENCOUNTER — Encounter (HOSPITAL_COMMUNITY): Payer: Self-pay | Admitting: Emergency Medicine

## 2017-01-24 DIAGNOSIS — F29 Unspecified psychosis not due to a substance or known physiological condition: Secondary | ICD-10-CM | POA: Diagnosis present

## 2017-01-24 DIAGNOSIS — F319 Bipolar disorder, unspecified: Secondary | ICD-10-CM | POA: Diagnosis not present

## 2017-01-24 DIAGNOSIS — R4182 Altered mental status, unspecified: Secondary | ICD-10-CM | POA: Diagnosis not present

## 2017-01-24 DIAGNOSIS — Z046 Encounter for general psychiatric examination, requested by authority: Secondary | ICD-10-CM | POA: Insufficient documentation

## 2017-01-24 DIAGNOSIS — R451 Restlessness and agitation: Secondary | ICD-10-CM | POA: Diagnosis not present

## 2017-01-24 DIAGNOSIS — F3170 Bipolar disorder, currently in remission, most recent episode unspecified: Secondary | ICD-10-CM | POA: Diagnosis present

## 2017-01-24 LAB — ACETAMINOPHEN LEVEL: Acetaminophen (Tylenol), Serum: <10 ug/mL — ABNORMAL LOW (ref 10–30)

## 2017-01-24 LAB — COMPREHENSIVE METABOLIC PANEL
ALBUMIN: 3.9 g/dL (ref 3.5–5.0)
ALK PHOS: 61 U/L (ref 38–126)
ALT: 17 U/L (ref 14–54)
AST: 34 U/L (ref 15–41)
Anion gap: 9 (ref 5–15)
BUN: 10 mg/dL (ref 6–20)
CALCIUM: 8.9 mg/dL (ref 8.9–10.3)
CO2: 25 mmol/L (ref 22–32)
CREATININE: 0.75 mg/dL (ref 0.44–1.00)
Chloride: 105 mmol/L (ref 101–111)
GFR calc Af Amer: >60 mL/min (ref >60)
GFR calc non Af Amer: >60 mL/min (ref >60)
GLUCOSE: 100 mg/dL — AB (ref 65–99)
Potassium: 2.8 mmol/L — ABNORMAL LOW (ref 3.5–5.1)
SODIUM: 139 mmol/L (ref 135–145)
Total Bilirubin: 0.8 mg/dL (ref 0.3–1.2)
Total Protein: 8.1 g/dL (ref 6.5–8.1)

## 2017-01-24 LAB — CBC
HEMATOCRIT: 30 % — AB (ref 36.0–46.0)
HEMOGLOBIN: 10.3 g/dL — AB (ref 12.0–15.0)
MCH: 28.4 pg (ref 26.0–34.0)
MCHC: 34.3 g/dL (ref 30.0–36.0)
MCV: 82.6 fL (ref 78.0–100.0)
Platelets: 241 10*3/uL (ref 150–400)
RBC: 3.63 MIL/uL — ABNORMAL LOW (ref 3.87–5.11)
RDW: 13.9 % (ref 11.5–15.5)
WBC: 4.1 10*3/uL (ref 4.0–10.5)

## 2017-01-24 LAB — ETHANOL: Alcohol, Ethyl (B): <5 mg/dL (ref <5)

## 2017-01-24 LAB — SALICYLATE LEVEL: Salicylate Lvl: <7 mg/dL (ref 2.8–30.0)

## 2017-01-24 NOTE — ED Triage Notes (Signed)
Patient arrives by EMS with [employer]. Patient found in ditch at 2130 behind a residence and patient was naked. [employer] states they have been "dealing with her all day". CBG 113. HR 90,BP 116/76-#20 right wrist with Haldol 2.5 mg IV. EMS states aggressive with "bizarre behavior". EMS states she was speaking gibberish.

## 2017-01-24 NOTE — ED Notes (Signed)
Bed: RESB Expected date:  Expected time:  Means of arrival:  Comments: EMS 51 yo female found walking naked in neighborhood/altered mental status-haldol 5 mg

## 2017-01-25 LAB — RAPID URINE DRUG SCREEN, HOSP PERFORMED
Amphetamines: NOT DETECTED
BENZODIAZEPINES: NOT DETECTED
Barbiturates: NOT DETECTED
COCAINE: NOT DETECTED
Opiates: NOT DETECTED
TETRAHYDROCANNABINOL: NOT DETECTED

## 2017-01-25 MED ORDER — POTASSIUM CHLORIDE CRYS ER 20 MEQ PO TBCR
40.0000 meq | EXTENDED_RELEASE_TABLET | Freq: Once | ORAL | Status: DC
  Filled 2017-01-25: qty 2

## 2017-01-25 MED ORDER — ALUM & MAG HYDROXIDE-SIMETH 200-200-20 MG/5ML PO SUSP: 30.0000 mL | Freq: Four times a day (QID) | ORAL | Status: DC | PRN

## 2017-01-25 MED ORDER — ZOLPIDEM TARTRATE 5 MG PO TABS: 5.0000 mg | ORAL_TABLET | Freq: Every evening | ORAL | Status: DC | PRN

## 2017-01-25 MED ORDER — ACETAMINOPHEN 325 MG PO TABS: 650.0000 mg | ORAL_TABLET | ORAL | Status: DC | PRN

## 2017-01-25 NOTE — ED Notes (Signed)
[facility] Expected date:  Expected time:  Means of arrival:  Comments: Res B

## 2017-01-25 NOTE — ED Notes (Signed)
Pt has large blisters on both feet in which I covered with tegaderm and instructed pt. On keeping area clean and encouraged pt to wear socks while here in hospital.

## 2017-01-25 NOTE — ED Notes (Signed)
Patient declines slippers-states she has blisters on her feet and doesn't want them

## 2017-01-25 NOTE — ED Notes (Signed)
Patient has no belongings except for one earring

## 2017-01-25 NOTE — BH Assessment (Signed)
[facility] Assessment Progress Note  Per Thedore MinsMojeed Akintayo, MD, this pt does not require psychiatric hospitalization at this time.  [facility] [place].  This has been included in pt's discharge instructions.  Pt's nurse, Kendal Hymendie, has been notified.  Doylene Canninghomas Meleana Commerford, MA Triage Specialist [phone]

## 2017-01-25 NOTE — Discharge Instructions (Signed)
For your behavioral health needs, you are advised to follow up with the [facility].  Contact them at your earliest opportunity to ask about scheduling an intake appointment:       [facility]      [address]      [place], [place] [address]      [phone]

## 2017-01-25 NOTE — BHH Suicide Risk Assessment (Signed)
Suicide Risk Assessment  Discharge Assessment   [facility] Discharge Suicide [facility]   Principal Problem: Bipolar affective disorder in remission [facility](HCC) Discharge Diagnoses:  Patient Active Problem List   Diagnosis Date Noted  . Bipolar affective disorder in remission (HCC) [F31.70] 01/02/2013    Priority: High  . Involuntary commitment [Z04.6]   . Manic behavior (HCC) [F30.10]   . Facial cellulitis [L03.211] 02/10/2014  . MDD (major depressive disorder) [F32.9] 08/02/2012    Total Time spent with patient: 45 minutes  Musculoskeletal: Strength & Muscle Tone: within normal limits Gait & Station: normal Patient leans: N/A  Psychiatric Specialty Exam:   Blood pressure 109/69, pulse 92, temperature 98.3 F (36.8 C), temperature source Oral, resp. rate 16, SpO2 100 %.There is no height or weight on file to calculate BMI.  General Appearance: Casual  Eye Contact::  Good  Speech:  Normal Rate409  Volume:  Normal  Mood:  Euthymic  Affect:  Congruent  Thought Process:  Coherent and Descriptions of Associations: Intact  Orientation:  Full (Time, Place, and Person)  Thought Content:  WDL and Logical  Suicidal Thoughts:  No  Homicidal Thoughts:  No  Memory:  Immediate;   Good Recent;   Good Remote;   Good  Judgement:  Fair  Insight:  Fair  Psychomotor Activity:  Normal  Concentration:  Good  Recall:  Good  Fund of Knowledge:Fair  Language: Good  Akathisia:  No  Handed:  Right  AIMS (if indicated):     Assets:  Housing Leisure Time Physical Health Resilience Social Support  Sleep:     Cognition: WNL  ADL's:  Intact   Mental Status Per Nursing Assessment::   On Admission:   IVC'd by her sister for lying naked outside.  She reports she had on a black bikini and "I'm not crazy, don't need to be here."  She is not suicidal or homicidal or psychotic.  No substance abuse.  Stable for discharge.  Demographic Factors:  NA  Loss Factors: NA  Historical  Factors: NA  Risk Reduction Factors:   Sense of responsibility to family, Living with another person, especially a relative, Positive social support and Positive therapeutic relationship  Continued Clinical Symptoms:  None  Cognitive Features That Contribute To Risk:  None    Suicide Risk:  Minimal: No identifiable suicidal ideation.  Patients presenting with no risk factors but with morbid ruminations; may be classified as minimal risk based on the severity of the depressive symptoms    Plan Of Care/Follow-up recommendations:  Activity:  as tolerated Diet:  heart healthy diet  Ell Tiso, NP 01/25/2017, 10:02 AM

## 2017-01-25 NOTE — ED Provider Notes (Signed)
[facility]-EMERGENCY DEPT Provider Note   CSN: [id number] Arrival date & time: 01/24/17  2221     History   Chief Complaint Chief Complaint  Patient presents with  . Mental Evaluation    HPI Yvonne [name] is a 51 y.o. female.  HPI   Patient is a 51 year old female with history of bipolar 1 disorder and acute psychosis who presents to the ED by EMS with [facility] due to psychotic behavior. GPD report patient was found in a ditch and her neighbor's backyard at 11:30 PM. [facility]PD notes patient was naked. Upon questioning patient initially agreed not wearing any close to ED nurse however upon my initial evaluation patient declined wearing closed and states "I have no idea why I'm here, I just want to go home". EMS administered 2.5 mg IV Haldol prior to arrival. EMS also report patient was speaking gibberish and was having bizarre behavior. Patient denies any pain or complaints at this time. Denies SI, HI, or hallucinations. Denies any alcohol or drug use and states she currently is not taking any prescribed medications.  Past Medical History:  Diagnosis Date  . Bipolar 1 disorder (HCC)   . Medical history non-contributory   . Mental disorder   . No pertinent past medical history     Patient Active Problem List   Diagnosis Date Noted  . Involuntary commitment   . Manic behavior (HCC)   . Facial cellulitis 02/10/2014  . Bipolar disord, crnt epsd mixed, severe, w/o psych features (HCC) 04/25/2013  . Bipolar disorder, unspecified (HCC) 01/02/2013  . MDD (major depressive disorder) 08/02/2012    Past Surgical History:  Procedure Laterality Date  . NO PAST SURGERIES      OB History    No data available       Home Medications    Prior to Admission medications   Medication Sig Start Date End Date Taking? Authorizing Provider  carbamazepine (TEGRETOL XR) 200 MG 12 hr tablet Take 1 tablet (200 mg total) by mouth 2 (two) times daily. For mood stabilization 01/14/17  Yes Nwoko, Nicole KindredAgnes I, NP    hydrOXYzine (ATARAX/VISTARIL) 25 MG tablet Take 1 tablet (25 mg total) by mouth every 6 (six) hours as needed for anxiety. 01/14/17  Yes Armandina StammerNwoko, Agnes I, NP  propranolol (INDERAL) 10 MG tablet Take 1 tablet (10 mg total) by mouth 2 (two) times daily. For anxiety 01/14/17  Yes Armandina StammerNwoko, Agnes I, NP  traZODone (DESYREL) 50 MG tablet Take 1 tablet (50 mg total) by mouth at bedtime as needed for sleep. 01/14/17  Yes Sanjuana KavaNwoko, Agnes I, NP    Family History Family History  Problem Relation Age of Onset  . Depression Sister   . Depression Brother   . Depression Sister   . CAD Mother   . Hypertension Father   . Diabetes Father   . Bipolar disorder Cousin     Social History Social History  Substance Use Topics  . Smoking status: Never Smoker  . Smokeless tobacco: Never Used  . Alcohol use No     Allergies   Patient has no known allergies.   Review of Systems Review of Systems  Psychiatric/Behavioral: Positive for behavioral problems.  All other systems reviewed and are negative.    Physical Exam Updated Vital Signs BP 104/77 (BP Location: Right Arm)   Pulse 78   Temp 98.3 F (36.8 C) (Oral)   Resp 18   SpO2 100%   Physical Exam  Constitutional: She is oriented to person, place, and time.  She appears well-developed and well-nourished. No distress.  HENT:  Head: Normocephalic and atraumatic.  Mouth/Throat: Oropharynx is clear and moist. No oropharyngeal exudate.  Eyes: Conjunctivae and EOM are normal. Right eye exhibits no discharge. Left eye exhibits no discharge. No scleral icterus.  Neck: Normal range of motion. Neck supple.  Cardiovascular: Normal rate, regular rhythm, normal heart sounds and intact distal pulses.   Pulmonary/Chest: Effort normal and breath sounds normal. No respiratory distress. She has no wheezes. She has no rales. She exhibits no tenderness.  Abdominal: Soft. Bowel sounds are normal. She exhibits no distension. There is no tenderness.  Musculoskeletal: Normal  range of motion. She exhibits no edema or tenderness.  Neurological: She is alert and oriented to person, place, and time.  Skin: Skin is warm and dry. She is not diaphoretic.  Psychiatric: Her speech is normal. Her affect is angry. She is agitated. Cognition and memory are impaired. She expresses inappropriate judgment.  Nursing note and vitals reviewed.    ED Treatments / Results  Labs (all labs ordered are listed, but only abnormal results are displayed) Labs Reviewed  COMPREHENSIVE METABOLIC PANEL - Abnormal; Notable for the following:       Result Value   Potassium 2.8 (*)    Glucose, Bld 100 (*)    All other components within normal limits  ACETAMINOPHEN LEVEL - Abnormal; Notable for the following:    Acetaminophen (Tylenol), Serum <10 (*)    All other components within normal limits  CBC - Abnormal; Notable for the following:    RBC 3.63 (*)    Hemoglobin 10.3 (*)    HCT 30.0 (*)    All other components within normal limits  ETHANOL  SALICYLATE LEVEL  RAPID URINE DRUG SCREEN, HOSP PERFORMED    EKG  EKG Interpretation None       Radiology No results found.  Procedures Procedures (including critical care time)  Medications Ordered in ED Medications  potassium chloride SA (K-DUR,KLOR-CON) CR tablet 40 mEq (not administered)  acetaminophen (TYLENOL) tablet 650 mg (not administered)  zolpidem (AMBIEN) tablet 5 mg (not administered)  alum & mag hydroxide-simeth (MAALOX/MYLANTA) 200-200-20 MG/5ML suspension 30 mL (not administered)     Initial Impression / Assessment and Plan / ED Course  I have reviewed the triage vital signs and the nursing notes.  Pertinent labs & imaging results that were available during my care of the patient were reviewed by me and considered in my medical decision making (see chart for details).     Patient presents with bizarre behavior reported by [employer]. Patient was found naked in her neighbor's backyard. Patient denies any alcohol  or drug use and states she currently is not taking any prescribed psych medications. Patient denies any pain or complaints. VSS. On exam patient appeared mildly agitated with impaired memory and inappropriate judgment, remaining exam unremarkable. Labs revealed potassium 2.8, patient given oral potassium supplement in the ED. Remaining labs unremarkable. Patient medically cleared. Chart review shows patient was similar episodes of behavioral changes in the past related to noncompliance with her bipolar medications. Consulted TTS.   Final Clinical Impressions(s) / ED Diagnoses   Final diagnoses:  None    New Prescriptions New Prescriptions   No medications on file     Gilford Rile 01/25/17 1610    Dione Booze, MD 01/25/17 [phone]

## 2017-01-25 NOTE — BH Assessment (Addendum)
Assessment Note  Yvonne [name] is an 51 y.o. female. Patient with history of Bipolar I Disorder. She presents to [name] by [facility] due to psychotic behaviors. Patient sts, "I have no idea why I am here today.Marland KitchenMarland KitchenMarland KitchenThe last thing I recall was sitting on my porch minding my own business". Per ED notes, "GPD reports that patient was found in a ditch and her neighbor's backyard. She was found naked. Patient was speaking gibberish and was having bizarre behavior". Patient was given Haldol. Patient with recent Emergency visits 01/02/2017 and 11/24/2016 due to to similar complaints.  Patient denies suicidal ideations. She denies prior history of suicidal ideations. No self mutilating behaviors. Denies family history of mental health diagnosis. She denies depressive symptoms. Denies anxiety. Denies homicidal ideations. Denies a history of aggressive or assaultive behaviors. Denies AVH's. Patient doesn't appear to be responding to internal stimuli.  Denies alcohol and drug use. UDS and BAL are both negative.   Patient does not have a current outpatient therapist or psychiatrist. She does report seeking psychiatric treatment with [name] and in the past. She denies taking any psychotropic medications stating,"Why would I take anything....it's nothing wrong with me". She has a history of INPT mental health treatment. Prior hospitalization dates are 04/24/2013, 01/01/2013, and 01/05/2017.   Patient is dressed in scrubs. She is cooperative but agitated. Speech is normal. Eye contact is appropriate. Judgement and insight are both poor. Concentration is poor. Sleep and appetite are both poor.    Diagnosis: Bipolar I Disorder   Past Medical History:  Past Medical History:  Diagnosis Date  . Bipolar 1 disorder (HCC)   . Medical history non-contributory   . Mental disorder   . No pertinent past medical history     Past Surgical History:  Procedure Laterality Date  . NO PAST SURGERIES      Family History:  Family  History  Problem Relation Age of Onset  . Depression Sister   . Depression Brother   . Depression Sister   . CAD Mother   . Hypertension Father   . Diabetes Father   . Bipolar disorder Cousin     Social History:  reports that she has never smoked. She has never used smokeless tobacco. She reports that she does not drink alcohol or use drugs.  Additional Social History:  Alcohol / Drug Use Pain Medications: SEE MAR Prescriptions: SEE MAR Over the Counter: SEE MAR History of alcohol / drug use?: No history of alcohol / drug abuse Longest period of sobriety (when/how long): UTA  CIWA: CIWA-Ar BP: 109/69 Pulse Rate: 92 COWS:    Allergies: No Known Allergies  Home Medications:  (Not in a [name] admission)  OB/GYN Status:  No LMP recorded. Patient is not currently having periods (Reason: Perimenopausal).  General Assessment Data Location of Assessment: [facility] TTS Assessment: In system Is this a Tele or Face-to-Face Assessment?: Face-to-Face Is this an Initial Assessment or a Re-assessment for this encounter?: Initial Assessment Marital status: Single Maiden name:  Yvonne [name]) Is patient pregnant?: No Pregnancy Status: No Living Arrangements: Other (Comment) (Homeless) Can pt return to current living arrangement?: Yes Admission Status: Voluntary Is patient capable of signing voluntary admission?: No Referral Source: Self/Family/Friend Insurance type:  Education officer, museum)  Medical Screening Exam [name] Walk-in ONLY) Medical Exam completed: Yes  Crisis Care Plan Living Arrangements: Other (Comment) (Homeless) Legal Guardian: Other: Name of Psychiatrist: UTA Name of Therapist: UTA  Education Status Is patient currently in school?: No Current Grade:  (n/a) Highest grade of  school patient has completed: UTA Name of school: UTA Contact person: UTA  Risk to self with the past 6 months Suicidal Ideation: No Has patient been a risk to self within the past 6 months prior to admission? :  No Suicidal Intent: No Has patient had any suicidal intent within the past 6 months prior to admission? : No Is patient at risk for suicide?: No Suicidal Plan?: No Has patient had any suicidal plan within the past 6 months prior to admission? : No Access to Means: No What has been your use of drugs/alcohol within the last 12 months?:  (UTA) Previous Attempts/Gestures: No How many times?:  (0) Other Self Harm Risks:  (UTA) Triggers for Past Attempts: Unknown Intentional Self Injurious Behavior: None Family Suicide History: Unable to assess Recent stressful life event(s): Other (Comment) (UTA) Persecutory voices/beliefs?: No Depression: No Depression Symptoms:  (UTA) Substance abuse history and/or treatment for substance abuse?: No Suicide prevention information given to non-admitted patients: Not applicable  Risk to Others within the past 6 months Homicidal Ideation: No Does patient have any lifetime risk of violence toward others beyond the six months prior to admission? : No Thoughts of Harm to Others: No Current Homicidal Intent: No Current Homicidal Plan: No Access to Homicidal Means: No Identified Victim:  (no identified victim ) History of harm to others?: No Assessment of Violence: None Noted Violent Behavior Description:  (patient is currently cooperative; aggitated ) Does patient have access to weapons?: No Criminal Charges Pending?: No Does patient have a court date: No Is patient on probation?: Unknown  Psychosis Hallucinations:  (patient denies ) Delusions: Unspecified  Mental Status Report Appearance/Hygiene: Unremarkable Eye Contact: Fair Motor Activity: Freedom of movement Speech: Argumentative Level of Consciousness: Alert, Irritable Mood: Labile Affect: Blunted Anxiety Level: Moderate Thought Processes: Circumstantial Judgement: Impaired Orientation: Person, Place, Time Obsessive Compulsive Thoughts/Behaviors: Unable to Assess  Cognitive  Functioning Concentration: Poor Memory: Unable to Assess IQ: Average Insight: Poor Impulse Control: Poor Appetite: Fair Weight Loss:  (patient reports significant weight loss ) Weight Gain:  (none reported) Sleep: Decreased Total Hours of Sleep:  (pt unable to provide specific amt of sleep) Vegetative Symptoms: Unable to Assess  ADLScreening [name]) Patient's cognitive ability adequate to safely complete daily activities?: Yes Patient able to express need for assistance with ADLs?: Yes Independently performs ADLs?: Yes (appropriate for developmental age)  Prior Inpatient Therapy Prior Inpatient Therapy: Yes Prior Therapy Dates: 2014 Prior Therapy [facility]ilty[facility]Provider([facility] Reason for Treatment: Manic  Prior Outpatient Therapy Prior Outpatient Therapy:  (UTA) Prior Therapy Dates: N/A Prior Therapy Facilty/Provider(s): N/A Reason for Treatment: N/A Does patient have an ACCT team?: Unknown Does patient have Intensive In-House Services?  : No Does patient have [facility] services? : No Does patient have P4CC services?: No  ADL Screening (condition at time of admission) Patient's cognitive ability adequate to safely complete daily activities?: Yes Is the patient deaf or have difficulty hearing?: No Does the patient have difficulty seeing, even when wearing glasses/contacts?: No Does the patient have difficulty concentrating, remembering, or making decisions?: Yes Patient able to express need for assistance with ADLs?: Yes Does the patient have difficulty dressing or bathing?: No Independently performs ADLs?: Yes (appropriate for developmental age) Does the patient have difficulty walking or climbing stairs?: No Weakness of Legs: None Weakness of Arms/Hands: None  Home Assistive Devices/Equipment Home Assistive Devices/Equipment: None    Abuse/Neglect Assessment (Assessment to be complete while patient is alone) Physical Abuse: Denies Verbal Abuse:  Denies Sexual Abuse: Denies Exploitation of patient/patient's resources: Denies Self-Neglect: Denies Values / Beliefs Cultural Requests During Hospitalization: None Spiritual Requests During Hospitalization: None   Advance Directives (For Healthcare) Does Patient Have a Medical Advance Directive?: No Would patient like information on creating a medical advance directive?: No - Patient declined Nutrition Screen- [facility] Patient's home diet: Regular  Additional Information 1:1 In Past 12 Months?:  (UTA) CIRT Risk: Yes Elopement Risk: Yes Does patient have medical clearance?: Yes     Disposition: Patient evaluated by [name] and [name], DNP, patient is psychiatrically cleared. Discharge pending. Patient to follow up with outpatient providers.   Disposition Initial Assessment Completed for this Encounter: Yes  On Site Evaluation by:   Reviewed with Physician:    Melynda Ripple 01/25/2017 8:50 AM

## 2017-01-25 NOTE — ED Notes (Signed)
Pt discharged safely with discharge instructions.  All belongings were returned to pt.  Pt was in no distress at discharge.

## 2017-01-25 NOTE — ED Notes (Signed)
Patient resting in bed asleep, with no signs of distress noted.

## 2017-01-25 NOTE — ED Notes (Signed)
Patient arrived to unit, and requests to have extra blankets and to be left alone. Pt denies suicidal or homicidal ideations. No signs of distress noted. Pt closed eyes and with no further interaction with Clinical research associatewriter.

## 2017-01-25 NOTE — ED Notes (Signed)
Report to [facility] patient declined her po potassium

## 2017-02-13 ENCOUNTER — Emergency Department (HOSPITAL_COMMUNITY)
Admission: EM | Admit: 2017-02-13 | Discharge: 2017-02-17 | Disposition: A | Discharge status: Home / Self Care | Payer: Medicaid Other | Attending: Emergency Medicine | Admitting: Emergency Medicine

## 2017-02-13 DIAGNOSIS — F329 Major depressive disorder, single episode, unspecified: Secondary | ICD-10-CM | POA: Diagnosis present

## 2017-02-13 DIAGNOSIS — Z046 Encounter for general psychiatric examination, requested by authority: Secondary | ICD-10-CM

## 2017-02-13 DIAGNOSIS — Z79899 Other long term (current) drug therapy: Secondary | ICD-10-CM | POA: Insufficient documentation

## 2017-02-13 DIAGNOSIS — F3010 Manic episode without psychotic symptoms, unspecified: Secondary | ICD-10-CM | POA: Diagnosis present

## 2017-02-13 DIAGNOSIS — F3189 Other bipolar disorder: Secondary | ICD-10-CM

## 2017-02-13 DIAGNOSIS — F3161 Bipolar disorder, current episode mixed, mild: Secondary | ICD-10-CM | POA: Insufficient documentation

## 2017-02-13 DIAGNOSIS — F319 Bipolar disorder, unspecified: Secondary | ICD-10-CM | POA: Diagnosis present

## 2017-02-13 LAB — CBC WITH DIFFERENTIAL/PLATELET
BASOS PCT: 1 %
Basophils Absolute: 0 10*3/uL (ref 0.0–0.1)
EOS ABS: 0 10*3/uL (ref 0.0–0.7)
EOS PCT: 0 %
HCT: 30.1 % — ABNORMAL LOW (ref 36.0–46.0)
Hemoglobin: 10.3 g/dL — ABNORMAL LOW (ref 12.0–15.0)
LYMPHS ABS: 1 10*3/uL (ref 0.7–4.0)
Lymphocytes Relative: 36 %
MCH: 28.1 pg (ref 26.0–34.0)
MCHC: 34.2 g/dL (ref 30.0–36.0)
MCV: 82.2 fL (ref 78.0–100.0)
MONO ABS: 0.3 10*3/uL (ref 0.1–1.0)
Monocytes Relative: 10 %
NEUTROS ABS: 1.5 10*3/uL — AB (ref 1.7–7.7)
NEUTROS PCT: 53 %
PLATELETS: 214 10*3/uL (ref 150–400)
RBC: 3.66 MIL/uL — ABNORMAL LOW (ref 3.87–5.11)
RDW: 13.5 % (ref 11.5–15.5)
WBC: 2.8 10*3/uL — ABNORMAL LOW (ref 4.0–10.5)

## 2017-02-13 LAB — COMPREHENSIVE METABOLIC PANEL
ALT: 12 U/L — ABNORMAL LOW (ref 14–54)
AST: 30 U/L (ref 15–41)
Albumin: 3.5 g/dL (ref 3.5–5.0)
Alkaline Phosphatase: 45 U/L (ref 38–126)
Anion gap: 11 (ref 5–15)
BUN: 7 mg/dL (ref 6–20)
CHLORIDE: 107 mmol/L (ref 101–111)
CO2: 25 mmol/L (ref 22–32)
Calcium: 9.1 mg/dL (ref 8.9–10.3)
Creatinine, Ser: 0.66 mg/dL (ref 0.44–1.00)
Glucose, Bld: 90 mg/dL (ref 65–99)
POTASSIUM: 3.1 mmol/L — AB (ref 3.5–5.1)
SODIUM: 143 mmol/L (ref 135–145)
Total Bilirubin: 0.6 mg/dL (ref 0.3–1.2)
Total Protein: 7.4 g/dL (ref 6.5–8.1)

## 2017-02-13 LAB — URINALYSIS, ROUTINE W REFLEX MICROSCOPIC
BILIRUBIN URINE: NEGATIVE
GLUCOSE, UA: NEGATIVE mg/dL
Hgb urine dipstick: NEGATIVE
Ketones, ur: NEGATIVE mg/dL
Leukocytes, UA: NEGATIVE
NITRITE: NEGATIVE
PH: 6 (ref 5.0–8.0)
Protein, ur: NEGATIVE mg/dL
SPECIFIC GRAVITY, URINE: 1.002 — AB (ref 1.005–1.030)

## 2017-02-13 LAB — RAPID URINE DRUG SCREEN, HOSP PERFORMED
AMPHETAMINES: NOT DETECTED
BENZODIAZEPINES: NOT DETECTED
Barbiturates: NOT DETECTED
COCAINE: NOT DETECTED
OPIATES: NOT DETECTED
TETRAHYDROCANNABINOL: NOT DETECTED

## 2017-02-13 LAB — ACETAMINOPHEN LEVEL: Acetaminophen (Tylenol), Serum: <10 ug/mL — ABNORMAL LOW (ref 10–30)

## 2017-02-13 LAB — SALICYLATE LEVEL: Salicylate Lvl: <7 mg/dL (ref 2.8–30.0)

## 2017-02-13 LAB — ETHANOL

## 2017-02-13 MED ORDER — ONDANSETRON HCL 4 MG PO TABS: 4.0000 mg | ORAL_TABLET | Freq: Three times a day (TID) | ORAL | Status: DC | PRN

## 2017-02-13 MED ORDER — HYDROXYZINE HCL 25 MG PO TABS
25.0000 mg | ORAL_TABLET | Freq: Four times a day (QID) | ORAL | Status: DC | PRN
  Filled 2017-02-13: qty 1

## 2017-02-13 MED ORDER — POTASSIUM CHLORIDE CRYS ER 20 MEQ PO TBCR
40.0000 meq | EXTENDED_RELEASE_TABLET | Freq: Once | ORAL | Status: DC
  Filled 2017-02-13: qty 2

## 2017-02-13 MED ORDER — STERILE WATER FOR INJECTION IJ SOLN
INTRAMUSCULAR | Status: AC
  Administered 2017-02-13: 17:00:00
  Filled 2017-02-13: qty 10

## 2017-02-13 MED ORDER — ZIPRASIDONE MESYLATE 20 MG IM SOLR
20.0000 mg | Freq: Once | INTRAMUSCULAR | Status: AC
  Administered 2017-02-13: 20 mg via INTRAMUSCULAR
  Filled 2017-02-13: qty 20

## 2017-02-13 MED ORDER — ACETAMINOPHEN 325 MG PO TABS: 650.0000 mg | ORAL_TABLET | ORAL | Status: DC | PRN

## 2017-02-13 MED ORDER — TRAZODONE HCL 50 MG PO TABS
50.0000 mg | ORAL_TABLET | Freq: Every evening | ORAL | Status: DC | PRN
  Filled 2017-02-13: qty 1

## 2017-02-13 NOTE — ED Notes (Signed)
Bed: [id number] Expected date:  Expected time:  Means of arrival:  Comments: [facility]- delusional

## 2017-02-13 NOTE — ED Notes (Signed)
Pt refusing lab work. Refusing to get stuck. Attempted x 2 but to no avail.

## 2017-02-13 NOTE — ED Provider Notes (Signed)
[facility]-EMERGENCY DEPT Provider Note   CSN: [id number] Arrival date & time: 02/13/17  1629     History   Chief Complaint No chief complaint on file.   HPI Yvonne [name] is a 51 y.o. female.  HPI Patient is brought in by [facility]. She has been sleeping in her yard and going to her neighbor's houses mostly unclothed knocking on their doors. Per GPD, she has been asking the neighbors to go into her house to get her water and things that she needs. Patient has prior history of bipolar disorder with similar episodes of manic behaviors. Upon interviewing the patient, she goes into long soliloquies describing multiple tangential historical events regarding her family, describing many dates and tragic events. It is unclear if her information provided is based in reality, GPD indicates neighbors state that there was a death of several close family members all within the past 2 years. Past Medical History:  Diagnosis Date  . Bipolar 1 disorder (HCC)   . Medical history non-contributory   . Mental disorder   . No pertinent past medical history     Patient Active Problem List   Diagnosis Date Noted  . Involuntary commitment   . Manic behavior (HCC)   . Facial cellulitis 02/10/2014  . Bipolar affective disorder in remission (HCC) 01/02/2013  . MDD (major depressive disorder) 08/02/2012    Past Surgical History:  Procedure Laterality Date  . NO PAST SURGERIES      OB History    No data available       Home Medications    Prior to Admission medications   Medication Sig Start Date End Date Taking? Authorizing Provider  carbamazepine (TEGRETOL XR) 200 MG 12 hr tablet Take 1 tablet (200 mg total) by mouth 2 (two) times daily. For mood stabilization 01/14/17  Yes Nwoko, Nicole KindredAgnes I, NP  hydrOXYzine (ATARAX/VISTARIL) 25 MG tablet Take 1 tablet (25 mg total) by mouth every 6 (six) hours as needed for anxiety. 01/14/17  Yes Armandina StammerNwoko, Agnes I, NP  propranolol (INDERAL) 10 MG tablet Take 1 tablet (10 mg  total) by mouth 2 (two) times daily. For anxiety 01/14/17  Yes Armandina StammerNwoko, Agnes I, NP  traZODone (DESYREL) 50 MG tablet Take 1 tablet (50 mg total) by mouth at bedtime as needed for sleep. 01/14/17  Yes Sanjuana KavaNwoko, Agnes I, NP    Family History Family History  Problem Relation Age of Onset  . Depression Sister   . Depression Brother   . Depression Sister   . CAD Mother   . Hypertension Father   . Diabetes Father   . Bipolar disorder Cousin     Social History Social History  Substance Use Topics  . Smoking status: Never Smoker  . Smokeless tobacco: Never Used  . Alcohol use No     Allergies   Patient has no known allergies.   Review of Systems Review of Systems  10 Systems reviewed and are negative for acute change except as noted in the HPI.   Physical Exam Updated Vital Signs BP (!) 106/52 (BP Location: Left Arm)   Pulse 82   Temp 99.1 F (37.3 C) (Oral)   Resp 16   SpO2 100%   Physical Exam  Constitutional:  Patient is up and walking in the emergency department. She is wearing a small short spaghetti straps black nightshirt. She is handcuffed and has nothing else on. She is talking very briskly and continuously. Patient is very alert and hypervigilant. No respiratory distress.  HENT:  Head: Normocephalic and atraumatic.  Left Ear: External ear normal.  Nose: Nose normal.  Mouth/Throat: Oropharynx is clear and moist.  Eyes: EOM are normal.  Neck: Neck supple.  Cardiovascular: Normal rate, regular rhythm, normal heart sounds and intact distal pulses.   Pulmonary/Chest: Effort normal and breath sounds normal.  Abdominal: Soft. She exhibits no distension.  Musculoskeletal: Normal range of motion. She exhibits no edema or deformity.  As I interview the patient, she is in a standing position. She is pacing and walking in coordinated fashion. No evidence of apparent extremity injuries or deformities. She does have both hands cuffed behind her back. No appearance of deformity or  injury associated with this. Patient is not exhibiting pain in the cuffs.  Neurological: She is alert. No cranial nerve deficit. She exhibits normal muscle tone. Coordination normal.  Skin: Skin is warm and dry.     ED Treatments / Results  Labs (all labs ordered are listed, but only abnormal results are displayed) Labs Reviewed  COMPREHENSIVE METABOLIC PANEL - Abnormal; Notable for the following:       Result Value   Potassium 3.1 (*)    ALT 12 (*)    All other components within normal limits  CBC WITH DIFFERENTIAL/PLATELET - Abnormal; Notable for the following:    WBC 2.8 (*)    RBC 3.66 (*)    Hemoglobin 10.3 (*)    HCT 30.1 (*)    Neutro Abs 1.5 (*)    All other components within normal limits  ACETAMINOPHEN LEVEL - Abnormal; Notable for the following:    Acetaminophen (Tylenol), Serum <10 (*)    All other components within normal limits  URINALYSIS, ROUTINE W REFLEX MICROSCOPIC - Abnormal; Notable for the following:    Color, Urine STRAW (*)    Specific Gravity, Urine 1.002 (*)    All other components within normal limits  ETHANOL  RAPID URINE DRUG SCREEN, HOSP PERFORMED  SALICYLATE LEVEL  CARBAMAZEPINE LEVEL, TOTAL  I-STAT BETA HCG BLOOD, ED ([facility], [facility], [facility] ONLY)    EKG  EKG Interpretation None       Radiology No results found.  Procedures Procedures (including critical care time)  Medications Ordered in ED Medications  potassium chloride SA (K-DUR,KLOR-CON) CR tablet 40 mEq (not administered)  acetaminophen (TYLENOL) tablet 650 mg (not administered)  ondansetron (ZOFRAN) tablet 4 mg (not administered)  traZODone (DESYREL) tablet 50 mg (not administered)  hydrOXYzine (ATARAX/VISTARIL) tablet 25 mg (not administered)  ziprasidone (GEODON) injection 20 mg (20 mg Intramuscular Given 02/13/17 1710)  sterile water (preservative free) injection (  Given 02/13/17 1710)     Initial Impression / Assessment and Plan / ED Course  I have reviewed the triage vital signs  and the nursing notes.  Pertinent labs & imaging results that were available during my care of the patient were reviewed by me and considered in my medical decision making (see chart for details).     Final Clinical Impressions(s) / ED Diagnoses   Final diagnoses:  Severe bipolar affective disorder with psychosis [facility](HCC)  Patient psychiatric history per EMR indicates bipolar disorder. I did not see history schizophrenia. Patient's presentation and behavior are consistent with psychosis. Reportedly patient has been refusing to go into her own home and living in her yard in a scantily clad state. In the emergency department, the patient's speech is pressured with a continuous stream of content that seems grandiose and fictional. Patient has been given Geodon for agitation. Review of lab works is within  baseline with mild hypokalemia which can be treated orally. Patient is medically cleared for psychiatric assessment and disposition.  New Prescriptions New Prescriptions   No medications on file     Arby Barrette, MD 02/13/17 2354

## 2017-02-13 NOTE — ED Notes (Signed)
Lab called and stated they did not have enough urine to run a UA.  Made RN aware.

## 2017-02-14 LAB — CARBAMAZEPINE LEVEL, TOTAL

## 2017-02-14 MED ORDER — STERILE WATER FOR INJECTION IJ SOLN
INTRAMUSCULAR | Status: AC
  Filled 2017-02-14: qty 10

## 2017-02-14 MED ORDER — ZIPRASIDONE MESYLATE 20 MG IM SOLR
20.0000 mg | Freq: Once | INTRAMUSCULAR | Status: AC
  Administered 2017-02-14: 20 mg via INTRAMUSCULAR
  Filled 2017-02-14: qty 20

## 2017-02-14 MED ORDER — RISPERIDONE 1 MG PO TABS
1.0000 mg | ORAL_TABLET | Freq: Two times a day (BID) | ORAL | Status: DC
  Administered 2017-02-14: 1 mg via ORAL
  Filled 2017-02-14 (×2): qty 1

## 2017-02-14 MED ORDER — TRAZODONE HCL 50 MG PO TABS: 50.0000 mg | ORAL_TABLET | Freq: Every day | ORAL | Status: DC

## 2017-02-14 MED ORDER — LORAZEPAM 2 MG/ML IJ SOLN
1.0000 mg | Freq: Once | INTRAMUSCULAR | Status: AC
Start: 2017-02-14 — End: 2017-02-14
  Administered 2017-02-14: 1 mg via INTRAMUSCULAR
  Filled 2017-02-14: qty 1

## 2017-02-14 MED ORDER — HYDROXYZINE HCL 25 MG PO TABS
25.0000 mg | ORAL_TABLET | Freq: Three times a day (TID) | ORAL | Status: DC
  Filled 2017-02-14: qty 1

## 2017-02-14 MED ORDER — CARBAMAZEPINE ER 200 MG PO TB12
200.0000 mg | ORAL_TABLET | Freq: Two times a day (BID) | ORAL | Status: DC
  Administered 2017-02-14: 200 mg via ORAL
  Filled 2017-02-14 (×2): qty 1

## 2017-02-14 NOTE — ED Notes (Signed)
Patient sitting on bed talking non-stop to sitter.

## 2017-02-14 NOTE — BH Assessment (Addendum)
Tele Assessment Note   Yvonne [name] is an 51 y.o. female, who presents involuntary and unaccompanied to [facility] As clinician the pt's room, pt reported, "I haven't are anything in eight days. I passed out under a tree at my dads house and my sister called the police." Pt was a poor historian and only answer four questions during the assessment and because agitated. Pt denied SI, HI, AVH and access to weapons.   Pt was IVC's by the EDP. Per IVC paperwork: "Patient has been staying in her year, mostly undressed and will not go into her home. She is going to neighbor's houses in a moistly undressed state asking them to go into her house and get what she needs. Patient is exhibiting a delusional state in which she is having a constant conversation that rapidly shifts from topic to topic."   Clinician was unable to the assess the following: martial status, guardianship, legal involvement, family supports, history of abuse, sleep, appetite, symptoms of depression, self-injurios behaviors, orientation, history of violence, contract for safety, substance use, educational status, being linked to OPT resources (medication management and/or counseling.) Pt's UDS is pending.   Pt presents sleeping with rapid aggravated speech. Clinician observed the pt's scrubs (top and bottom) on the floor, while the pt is under the covers. Pt's eye contact was poor. Pt's mood is irritable. Pt's affect is flat. Pt's thought process is circumstantial. Pt's judgement is impaired. Pt's concentration, insight and impulse control are poor.   Diagnosis: Bipolar 1 Disorder (HCC)   Past Medical History:  Past Medical History:  Diagnosis Date  . Bipolar 1 disorder (HCC)   . Medical history non-contributory   . Mental disorder   . No pertinent past medical history     Past Surgical History:  Procedure Laterality Date  . NO PAST SURGERIES      Family History:  Family History  Problem Relation Age of Onset  . Depression Sister    . Depression Brother   . Depression Sister   . CAD Mother   . Hypertension Father   . Diabetes Father   . Bipolar disorder Cousin     Social History:  reports that she has never smoked. She has never used smokeless tobacco. She reports that she does not drink alcohol or use drugs.  Additional Social History:  Alcohol / Drug Use Pain Medications: See MAR Prescriptions: See MAR Over the Counter: See MAR  CIWA: CIWA-Ar BP: (!) 106/52 Pulse Rate: 82 COWS:    PATIENT STRENGTHS: (choose at least two) Average or above average intelligence General fund of knowledge  Allergies: No Known Allergies  Home Medications:  (Not in a hospital admission)  OB/GYN Status:  No LMP recorded. Patient is not currently having periods (Reason: Perimenopausal).  General Assessment Data Location of Assessment: [facility] TTS Assessment: In system Is this a Tele or Face-to-Face Assessment?: Face-to-Face Is this an Initial Assessment or a Re-assessment for this encounter?: Initial Assessment Marital status: Other (comment) (UTA) Living Arrangements:  (Per chart, homeless.) Can pt return to current living arrangement?: Yes Admission Status: Involuntary Referral Source: Self/Family/Friend Insurance type: [facility]     Crisis Care Plan Living Arrangements:  (Per chart, homeless.) Legal Guardian:  (UTA) Name of Psychiatrist: UTA Name of Therapist: UTA  Education Status Is patient currently in school?: No Current Grade: NA Highest grade of school patient has completed: UTA Name of school: UTA Contact person: UTA  Risk to self with the past 6 months Suicidal Ideation: No (Pt  denies. ) Has patient been a risk to self within the past 6 months prior to admission? : No Suicidal Intent: No Has patient had any suicidal intent within the past 6 months prior to admission? : No Is patient at risk for suicide?: No Suicidal Plan?: No Has patient had any suicidal plan within the past 6 months prior to  admission? : No Access to Means: No What has been your use of drugs/alcohol within the last 12 months?: Pending,  Previous Attempts/Gestures: No (Pt denies. ) How many times?: 0 Other Self Harm Risks: UTA Triggers for Past Attempts: None known Intentional Self Injurious Behavior:  (UTA) Family Suicide History: Unable to assess Recent stressful life event(s):  (UTA) Persecutory voices/beliefs?:  Rich Reining(UTA) Depression:  (UTA) Depression Symptoms:  (UTA) Substance abuse history and/or treatment for substance abuse?:  (UTA) Suicide prevention information given to non-admitted patients: Not applicable  Risk to Others within the past 6 months Homicidal Ideation: No (Pt denies. ) Does patient have any lifetime risk of violence toward others beyond the six months prior to admission? : No Thoughts of Harm to Others: No Current Homicidal Intent: No Current Homicidal Plan: No Access to Homicidal Means:  (UTA) Identified Victim: NA History of harm to others?:  (UTA) Assessment of Violence:  (UTA) Violent Behavior Description: UTA Does patient have access to weapons?:  (UTA) Criminal Charges Pending?:  (UTA) Does patient have a court date:  (UTA) Is patient on probation?:  (UTA)  Psychosis Hallucinations: Auditory (Per IVC. ) Delusions: Unspecified (Per IVC. )  Mental Status Report Appearance/Hygiene: Other (Comment) Eye Contact: Poor Motor Activity: Unremarkable Speech: Rapid, Aggressive Level of Consciousness: Sleeping Mood: Irritable Affect: Flat Anxiety Level: None Thought Processes: Circumstantial Judgement: Impaired Orientation: Unable to assess Obsessive Compulsive Thoughts/Behaviors: Unable to Assess  Cognitive Functioning Concentration: Poor Memory: Unable to Assess IQ: Average Insight: Poor Impulse Control: Poor Appetite:  (UTA) Weight Loss:  (UTA) Weight Gain:  (UTA) Sleep: Unable to Assess Total Hours of Sleep:  (UTA) Vegetative Symptoms: Unable to  Assess  ADLScreening [facility] Assessment Services) Patient's cognitive ability adequate to safely complete daily activities?: Yes Patient able to express need for assistance with ADLs?: Yes Independently performs ADLs?: Yes (appropriate for developmental age)  Prior Inpatient Therapy Prior Inpatient Therapy:  (UTA) Prior Therapy Dates: UTA Prior Therapy Facilty/Provider(s): UTA Reason for Treatment: UTA  Prior Outpatient Therapy Prior Outpatient Therapy:  (UTA) Prior Therapy Dates: UTA Prior Therapy Facilty/Provider(s): UTA Reason for Treatment: UTA Does patient have an ACCT team?: Unknown Does patient have Intensive In-House Services?  : Unknown Does patient have [facility] services? : Unknown Does patient have P4CC services?: Unknown  ADL Screening (condition at time of admission) Patient's cognitive ability adequate to safely complete daily activities?: Yes Is the patient deaf or have difficulty hearing?: No Does the patient have difficulty seeing, even when wearing glasses/contacts?: No Does the patient have difficulty concentrating, remembering, or making decisions?: Yes Patient able to express need for assistance with ADLs?: Yes Does the patient have difficulty dressing or bathing?: No Independently performs ADLs?: Yes (appropriate for developmental age) Does the patient have difficulty walking or climbing stairs?:  (UTA) Weakness of Legs:  (UTA) Weakness of Arms/Hands:  (UTA)       Abuse/Neglect Assessment (Assessment to be complete while patient is alone) Physical Abuse:  (UTA) Verbal Abuse:  (UTA) Sexual Abuse:  (UTA) Exploitation of patient/patient's resources:  (UTA) Self-Neglect:  (UTA)     Advance Directives (For Healthcare) Does Patient Have a Medical  Advance Directive?:  (UTA)    Additional Information 1:1 In Past 12 Months?: No CIRT Risk: Yes Elopement Risk: No Does patient have medical clearance?: Yes     Disposition: Nira Conn, NP recommends  inpatient treatment. Disposition discussed with Juliette Alcide, RN. Per [facility] no appropriate beds available. TTS to seek placement.  Disposition Initial Assessment Completed for this Encounter: Yes Disposition of Patient:  (Pending)  Redmond Pulling 02/14/2017 1:42 AM   Redmond Pulling, MS, [facility], [facility] Triage Specialist [phone]

## 2017-02-14 NOTE — ED Notes (Signed)
Patient still talking non-stop.  Ate breakfast but asking for more food. "I haven't eaten anything in eight days."

## 2017-02-14 NOTE — BH Assessment (Signed)
[facility] Assessment Progress Note  Per Thedore MinsMojeed Akintayo, MD, this pt requires psychiatric hospitalization at this time.  Pt presents under IVC initiated by EDP Arby BarretteMarcy Pfeiffer, MD.  The following facilities have been contacted to seek placement for this pt, with results as noted:  Beds available, information sent, decision pending:  [facility]   At capacity:  [facility]   Jeni Duling, [place] Triage Specialist [phone]

## 2017-02-14 NOTE — ED Notes (Signed)
Patient refused all PO medications earlier this morning.  Continues to have flight of ideas, with pressured speech and hyperactivity.  Patient only slept several hours last night.  Yvonne GuadeloupeLaurie Parks, NP called and geodon and ativan were ordered IM.  Patient required security presence to administer medications.

## 2017-02-14 NOTE — ED Notes (Signed)
Patient continues to talk rapidly and have flight of ideas.

## 2017-02-15 DIAGNOSIS — F312 Bipolar disorder, current episode manic severe with psychotic features: Secondary | ICD-10-CM | POA: Insufficient documentation

## 2017-02-15 DIAGNOSIS — Z590 Homelessness: Secondary | ICD-10-CM

## 2017-02-15 DIAGNOSIS — Z599 Problem related to housing and economic circumstances, unspecified: Secondary | ICD-10-CM

## 2017-02-15 DIAGNOSIS — F909 Attention-deficit hyperactivity disorder, unspecified type: Secondary | ICD-10-CM

## 2017-02-15 DIAGNOSIS — R443 Hallucinations, unspecified: Secondary | ICD-10-CM

## 2017-02-15 DIAGNOSIS — F3110 Bipolar disorder, current episode manic without psychotic features, unspecified: Secondary | ICD-10-CM

## 2017-02-15 DIAGNOSIS — Z818 Family history of other mental and behavioral disorders: Secondary | ICD-10-CM

## 2017-02-15 DIAGNOSIS — F419 Anxiety disorder, unspecified: Secondary | ICD-10-CM

## 2017-02-15 NOTE — Consult Note (Signed)
[place] Psychiatry Consult   Reason for Consult:  Manic behavior Referring Physician:  EDP Patient Identification: Yvonne [name] MRN:  [id number] Principal Diagnosis: Bipolar affective disorder, manic ([place]) Diagnosis:   Patient Active Problem List   Diagnosis Date Noted  . Severe bipolar affective disorder with psychosis ([place]) [F31.89]   . Bipolar affective disorder, manic ([place]) [F31.10] 01/05/2017  . Involuntary commitment [Z04.6]   . Manic behavior ([place]) [F30.10]   . Facial cellulitis [K59.977] 02/10/2014  . Bipolar affective disorder in remission ([place]) [F31.70] 01/02/2013  . MDD (major depressive disorder) [F32.9] 08/02/2012    Total Time spent with patient: 30 minutes  Subjective:   Yvonne [name] is a 51 y.o. female patient admitted with mania.  HPI:  Pt was seen by this clinician and Dr Darleene Cleaver. Pt was sitting on the bed talking to a family member on the telephone. Pt is extremely manic and stated she needs to leave to go buy a house because she is poor and homeless. Pt stated her sister doesn't understand this. Pt stated she is not taking her Bipolar meds because she is not Bipolar. 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 Pt' speech is rapid and pressured and she has flight of ideas. Pt would benefit from an inpatient psychiatric admission for crisis stabilization and medication management.   Past Psychiatric History: MDD, Manic behavior, Bipolar affective disorder with psychosis  Risk to Self: No Pt denies Risk to Others: None Prior Inpatient Therapy: Prior Inpatient Therapy:  (UTA) Prior Therapy Dates: UTA Prior Therapy Facilty/Provider(s): UTA Reason for Treatment: UTA Prior Outpatient Therapy: Prior Outpatient Therapy:  (UTA) Prior Therapy Dates: UTA Prior Therapy Facilty/Provider(s): UTA Reason for Treatment: UTA Does patient have an ACCT team?: Unknown Does patient have Intensive In-House Services?  : Unknown Does patient have [facility] services? :  Unknown Does patient have P4CC services?: Unknown  Past Medical History:  Past Medical History:  Diagnosis Date  . Bipolar 1 disorder ([place])   . Medical history non-contributory   . Mental disorder   . No pertinent past medical history     Past Surgical History:  Procedure Laterality Date  . NO PAST SURGERIES     Family History:  Family History  Problem Relation Age of Onset  . Depression Sister   . Depression Brother   . Depression Sister   . CAD Mother   . Hypertension Father   . Diabetes Father   . Bipolar disorder Cousin    Family Psychiatric  History: Unknown Social History:  History  Alcohol Use No     History  Drug Use No    Social History   Social History  . Marital status: Single    Spouse name: N/A  . Number of children: N/A  . Years of education: N/A   Social History Main Topics  . Smoking status: Never Smoker  . Smokeless tobacco: Never Used  . Alcohol use No  . Drug use: No  . Sexual activity: No   Other Topics Concern  . Not on file   Social History Narrative  . No narrative on file   Additional Social History:    Allergies:  No Known Allergies  Labs:  Results for orders placed or performed during the hospital encounter of 02/13/17 (from the past 48 hour(s))  Comprehensive metabolic panel     Status: Abnormal   Collection Time: 02/13/17  9:45 PM  Result Value Ref Range   Sodium 143 135 - 145 mmol/L  Potassium 3.1 (L) 3.5 - 5.1 mmol/L   Chloride 107 101 - 111 mmol/L   CO2 25 22 - 32 mmol/L   Glucose, Bld 90 65 - 99 mg/dL   BUN 7 6 - 20 mg/dL   Creatinine, Ser 0.66 0.44 - 1.00 mg/dL   Calcium 9.1 8.9 - 10.3 mg/dL   Total Protein 7.4 6.5 - 8.1 g/dL   Albumin 3.5 3.5 - 5.0 g/dL   AST 30 15 - 41 U/L   ALT 12 (L) 14 - 54 U/L   Alkaline Phosphatase 45 38 - 126 U/L   Total Bilirubin 0.6 0.3 - 1.2 mg/dL   GFR calc non Af Amer >60 >60 mL/min   GFR calc Af Amer >60 >60 mL/min    Comment: (NOTE) The eGFR has been calculated using the  CKD EPI equation. This calculation has not been validated in all clinical situations. eGFR's persistently <60 mL/min signify possible Chronic Kidney Disease.    Anion gap 11 5 - 15  Ethanol     Status: None   Collection Time: 02/13/17  9:45 PM  Result Value Ref Range   Alcohol, Ethyl (B) <5 <5 mg/dL    Comment:        LOWEST DETECTABLE LIMIT FOR SERUM ALCOHOL IS 5 mg/dL FOR MEDICAL PURPOSES ONLY   Urine rapid drug screen (hosp performed)     Status: None   Collection Time: 02/13/17  9:45 PM  Result Value Ref Range   Opiates NONE DETECTED NONE DETECTED   Cocaine NONE DETECTED NONE DETECTED   Benzodiazepines NONE DETECTED NONE DETECTED   Amphetamines NONE DETECTED NONE DETECTED   Tetrahydrocannabinol NONE DETECTED NONE DETECTED   Barbiturates NONE DETECTED NONE DETECTED    Comment:        DRUG SCREEN FOR MEDICAL PURPOSES ONLY.  IF CONFIRMATION IS NEEDED FOR ANY PURPOSE, NOTIFY LAB WITHIN 5 DAYS.        LOWEST DETECTABLE LIMITS FOR URINE DRUG SCREEN Drug Class       Cutoff (ng/mL) Amphetamine      1000 Barbiturate      200 Benzodiazepine   103 Tricyclics       013 Opiates          300 Cocaine          300 THC              50   CBC with Diff     Status: Abnormal   Collection Time: 02/13/17  9:45 PM  Result Value Ref Range   WBC 2.8 (L) 4.0 - 10.5 K/uL   RBC 3.66 (L) 3.87 - 5.11 MIL/uL   Hemoglobin 10.3 (L) 12.0 - 15.0 g/dL   HCT 30.1 (L) 36.0 - 46.0 %   MCV 82.2 78.0 - 100.0 fL   MCH 28.1 26.0 - 34.0 pg   MCHC 34.2 30.0 - 36.0 g/dL   RDW 13.5 11.5 - 15.5 %   Platelets 214 150 - 400 K/uL   Neutrophils Relative % 53 %   Lymphocytes Relative 36 %   Monocytes Relative 10 %   Eosinophils Relative 0 %   Basophils Relative 1 %   Neutro Abs 1.5 (L) 1.7 - 7.7 K/uL   Lymphs Abs 1.0 0.7 - 4.0 K/uL   Monocytes Absolute 0.3 0.1 - 1.0 K/uL   Eosinophils Absolute 0.0 0.0 - 0.7 K/uL   Basophils Absolute 0.0 0.0 - 0.1 K/uL   RBC Morphology POLYCHROMASIA PRESENT     Comment:  ELLIPTOCYTES  Salicylate level     Status: None   Collection Time: 02/13/17  9:45 PM  Result Value Ref Range   Salicylate Lvl <9.4 2.8 - 30.0 mg/dL  Acetaminophen level     Status: Abnormal   Collection Time: 02/13/17  9:45 PM  Result Value Ref Range   Acetaminophen (Tylenol), Serum <10 (L) 10 - 30 ug/mL    Comment:        THERAPEUTIC CONCENTRATIONS VARY SIGNIFICANTLY. A RANGE OF 10-30 ug/mL MAY BE AN EFFECTIVE CONCENTRATION FOR MANY PATIENTS. HOWEVER, SOME ARE BEST TREATED AT CONCENTRATIONS OUTSIDE THIS RANGE. ACETAMINOPHEN CONCENTRATIONS >150 ug/mL AT 4 HOURS AFTER INGESTION AND >50 ug/mL AT 12 HOURS AFTER INGESTION ARE OFTEN ASSOCIATED WITH TOXIC REACTIONS.   Urinalysis, Routine w reflex microscopic     Status: Abnormal   Collection Time: 02/13/17  9:45 PM  Result Value Ref Range   Color, Urine STRAW (A) YELLOW   APPearance CLEAR CLEAR   Specific Gravity, Urine 1.002 (L) 1.005 - 1.030   pH 6.0 5.0 - 8.0   Glucose, UA NEGATIVE NEGATIVE mg/dL   Hgb urine dipstick NEGATIVE NEGATIVE   Bilirubin Urine NEGATIVE NEGATIVE   Ketones, ur NEGATIVE NEGATIVE mg/dL   Protein, ur NEGATIVE NEGATIVE mg/dL   Nitrite NEGATIVE NEGATIVE   Leukocytes, UA NEGATIVE NEGATIVE  Carbamazepine level, total     Status: Abnormal   Collection Time: 02/13/17  9:45 PM  Result Value Ref Range   Carbamazepine Lvl <2.0 (L) 4.0 - 12.0 ug/mL    Comment: Performed at [facility], [place] [address], [place], [place] [address]    Current Facility-Administered Medications  Medication Dose Route Frequency Provider Last Rate Last Dose  . acetaminophen (TYLENOL) tablet 650 mg  650 mg Oral Q4H PRN Charlesetta Shanks, MD      . carbamazepine (TEGRETOL XR) 12 hr tablet 200 mg  200 mg Oral BID Darleene Cleaver, Anahla Bevis, MD   200 mg at 02/14/17 1258  . hydrOXYzine (ATARAX/VISTARIL) tablet 25 mg  25 mg Oral TID Darleene Cleaver, Sonoma Firkus, MD      . ondansetron (ZOFRAN) tablet 4 mg  4 mg Oral Q8H PRN Pfeiffer, Marcy, MD      .  potassium chloride SA (K-DUR,KLOR-CON) CR tablet 40 mEq  40 mEq Oral Once Charlesetta Shanks, MD      . risperiDONE (RISPERDAL) tablet 1 mg  1 mg Oral BID Darleene Cleaver, Kayne Yuhas, MD   1 mg at 02/14/17 1258  . traZODone (DESYREL) tablet 50 mg  50 mg Oral QHS Corena Pilgrim, MD       Current Outpatient Prescriptions  Medication Sig Dispense Refill  . carbamazepine (TEGRETOL XR) 200 MG 12 hr tablet Take 1 tablet (200 mg total) by mouth 2 (two) times daily. For mood stabilization 60 tablet 0  . hydrOXYzine (ATARAX/VISTARIL) 25 MG tablet Take 1 tablet (25 mg total) by mouth every 6 (six) hours as needed for anxiety. 60 tablet 0  . propranolol (INDERAL) 10 MG tablet Take 1 tablet (10 mg total) by mouth 2 (two) times daily. For anxiety 60 tablet 0  . traZODone (DESYREL) 50 MG tablet Take 1 tablet (50 mg total) by mouth at bedtime as needed for sleep. 30 tablet 0    Musculoskeletal: Strength & Muscle Tone: within normal limits Gait & Station: normal Patient leans: N/A  Psychiatric Specialty Exam: Physical Exam  Constitutional: She appears well-developed.  HENT:  Head: Normocephalic.  Right Ear: External ear normal.  Left Ear: External ear normal.  Respiratory:  Effort normal.  Musculoskeletal: Normal range of motion.  Neurological: She is alert.  Psychiatric: Her mood appears anxious. Her affect is labile. Her speech is rapid and/or pressured. She is agitated and hyperactive. Thought content is delusional. Cognition and memory are impaired. She expresses impulsivity.    Review of Systems  Psychiatric/Behavioral: Positive for hallucinations (bipolar with manic behavior). Negative for depression, memory loss, substance abuse and suicidal ideas. The patient is nervous/anxious. The patient does not have insomnia.   All other systems reviewed and are negative.   Blood pressure (!) 101/54, pulse 96, temperature 98.9 F (37.2 C), temperature source Oral, resp. rate 14, SpO2 99 %.There is no height or  weight on file to calculate BMI.  General Appearance: Casual  Eye Contact:  Good  Speech:  Pressured and rapid  Volume:  Increased  Mood:  Anxious, Irritable and manic  Affect:  Congruent and Labile  Thought Process:  Disorganized and Irrelevant  Orientation:  Full (Time, Place, and Person)  Thought Content:  Illogical, Delusions, Obsessions and Tangential  Suicidal Thoughts:  No  Homicidal Thoughts:  No  Memory:  Immediate;   Fair Recent;   Fair Remote;   Fair  Judgement:  Poor  Insight:  Lacking  Psychomotor Activity:  Increased  Concentration:  Concentration: Fair and Attention Span: Poor  Recall:  [employer] of Knowledge:  Fair  Language:  Good  Akathisia:  No  Handed:  Right  AIMS (if indicated):     Assets:  Communication Skills Resilience Social Support  ADL's:  Intact  Cognition:  WNL  Sleep:        Treatment Plan Summary: Daily contact with patient to assess and evaluate symptoms and progress in treatment and Medication management  Crisis stabilization Continue the following medications: Continue Tegretol XR 200  Disposition: Recommend psychiatric Inpatient admission when medically cleared. TTS to seek placement.   Ethelene Hal, NP 02/15/2017 12:16 PM  Patient seen face-to-face for psychiatric evaluation, chart reviewed and case discussed with the physician extender and developed treatment plan. Reviewed the information documented and agree with the treatment plan. Corena Pilgrim, MD

## 2017-02-15 NOTE — ED Notes (Signed)
Patient given water per request.

## 2017-02-15 NOTE — ED Notes (Signed)
Pt returned to room after shower, spoke on phone to various people, Pt refused her am meds, continue to observe, graham crackers with peanut butter and cran grape juice given at her request. 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

## 2017-02-15 NOTE — BH Assessment (Signed)
[facility] Assessment Progress Note  Per Thedore MinsMojeed Akintayo, MD, this pt continues to require psychiatric hospitalization at this time.  The following facilities have been contacted to seek placement for this pt, with results as noted:  Beds available, information sent, decision pending[facility]   Declined:  [facility] (due [facility]ity[facility]   At [facility]:  [facility]   Kynan Peasley, [place] Triage Specialist [phone]

## 2017-02-15 NOTE — ED Notes (Signed)
Pt requesting to shower. Personal items given and pt directed to shower.

## 2017-02-15 NOTE — ED Notes (Signed)
Pt is watching tv, has refused all of her meds this evening. Is having flight of ideas. Report given Marcelino DusterMichelle who will resume care. I will continue to monitor.

## 2017-02-15 NOTE — ED Notes (Signed)
Patient arrived to unit from TCU. Pt hyperverbal and with delusional statements. Pt states "I have the dead person faints, cause my son died so it happens to me". Pt reluctant to come onto [facility] unit stating "I don't need to be back here, I'm supposed to get [place]". Pt needing frequent redirection due to flight of ideas.

## 2017-02-15 NOTE — ED Notes (Signed)
Patient's sister at bedside.

## 2017-02-15 NOTE — ED Notes (Addendum)
Patient requesting to use phone. Patient given phone.

## 2017-02-16 NOTE — ED Notes (Signed)
Patient pleasant and cooperative with care. Patient continues to make delusional statements such as "I gotta get [place]lan [place] here so I can make my wedding plans and so we can buy our house next week! My sister is just jealous that I am happy now!". Patient with rapid speech and is disorganized at times. No distress noted; pt remains pleasant. Pt states "I'm leaving tomorrow because the doctor is letting me out at 8am".

## 2017-02-16 NOTE — ED Notes (Signed)
Pt hyper verbal, speech pressured, flight of ideas. Reports she has to leave the hospital because she is getting married.

## 2017-02-16 NOTE — ED Notes (Signed)
Patient refused Yvonne LandauRn Terion Hedman made aware

## 2017-02-16 NOTE — ED Notes (Signed)
Pt refused all morning medication. This nurse notified Dr.A. Special checks q 15 mins in place for safety. Video monitoring in place. Will continue to monitor.

## 2017-02-17 DIAGNOSIS — F3161 Bipolar disorder, current episode mixed, mild: Secondary | ICD-10-CM

## 2017-02-17 MED ORDER — ALUM & MAG HYDROXIDE-SIMETH 200-200-20 MG/5ML PO SUSP
ORAL | Status: AC
  Filled 2017-02-17: qty 30

## 2017-02-17 MED ORDER — RISPERIDONE 1 MG PO TABS: 1.0000 mg | ORAL_TABLET | Freq: Two times a day (BID) | ORAL | 0 refills | Status: DC

## 2017-02-17 NOTE — Discharge Instructions (Signed)
For your behavioral health needs, you are advised to follow up with one of the following providers.  Contact them at your earliest opportunity to ask about scheduling an intake appointment:  For your ongoing behavioral health needs you are advised to follow up with [facility].  New patients are seen at their walk-in clinic.  Walk-in hours are Monday - Friday from 8:00 am - 12:00 pm, and from 1:00 pm - 3:00 pm.  Walk-in patients are seen on a first come, first served basis, so try to arrive as early as possible for the best chance of being seen the same day.  There is an initial fee of $22.50:       [facility]      [address]      [place], [place] [address]      [phone]       [facility]      [address]      [place], [place] [address]      [phone]

## 2017-02-17 NOTE — BH Assessment (Signed)
[facility] Assessment Progress Note  Per Thedore MinsMojeed Akintayo, MD, this pt does not require psychiatric hospitalization at this time.  Pt presents under IVC initiated by EDP Arby BarretteMarcy Pfeiffer, MD, which Dr Jannifer FranklinAkintayo has rescinded.  [facility] with recommendation to follow up with [facility] or [facility].  This has been included in pt's discharge instructions.  Pt's nurse, Morrie Sheldonshley, has been notified.  Doylene Canninghomas Karcyn Menn, MA Triage Specialist [phone]

## 2017-02-17 NOTE — ED Notes (Signed)
Patient refused Yvonne LandauRn Yvonne [name] aware

## 2017-02-17 NOTE — [facility] Suicide Risk Assessment (Signed)
Suicide Risk Assessment  Discharge Assessment   [facility]   Principal Problem: Bipolar affective disorder, current episode mild [facility](HCC) Discharge Diagnoses:  Patient Active Problem List   Diagnosis Date Noted  . Bipolar affective disorder, current episode mild (HCC) [F31.9] 01/05/2017    Priority: High  . 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 Manic behavior (HCC) [F30.10]   . Facial cellulitis [L03.211] 02/10/2014  . MDD (major depressive disorder) [F32.9] 08/02/2012    Total Time spent with patient: 45 minutes  Musculoskeletal: Strength & Muscle Tone: within normal limits Gait & Station: normal Patient leans: N/A  Psychiatric Specialty Exam: Physical Exam  Constitutional: She is oriented to person, place, and time. She appears well-developed and well-nourished.  HENT:  Head: Normocephalic.  Neck: Normal range of motion.  Respiratory: Effort normal.  Musculoskeletal: Normal range of motion.  Neurological: She is alert and oriented to person, place, and time.  Psychiatric: She has a normal mood and affect. Her speech is normal and behavior is normal. Judgment and thought content normal. Cognition and memory are normal.    Review of Systems  All other systems reviewed and are negative.   Blood pressure (!) 92/59, pulse 75, temperature 99.2 F (37.3 C), temperature source Oral, resp. rate 16, SpO2 100 %.There is no height or weight on file to calculate BMI.  General Appearance: Casual  Eye Contact:  Good  Speech:  Normal Rate  Volume:  Normal  Mood:  Euthymic  Affect:  Congruent  Thought Process:  Coherent and Descriptions of Associations: Intact  Orientation:  Full (Time, Place, and Person)  Thought Content:  WDL and Logical  Suicidal Thoughts:  No  Homicidal Thoughts:  No  Memory:  Immediate;   Good Recent;   Good Remote;   Good  Judgement:  Fair  Insight:  Fair  Psychomotor Activity:  Normal   Concentration:  Concentration: Good and Attention Span: Good  Recall:  Good  Fund of Knowledge:  Fair  Language:  Good  Akathisia:  No  Handed:  Right  AIMS (if indicated):     Assets:  Leisure Time Physical Health Resilience Social Support  ADL's:  Intact  Cognition:  WNL  Sleep:      Mental Status Per Nursing Assessment::   On Admission:   mania  Demographic Factors:  Living alone  Loss Factors: NA  Historical Factors: NA  Risk Reduction Factors:   Sense of responsibility to family, Positive social support and Positive therapeutic relationship  Continued Clinical Symptoms:  None  Cognitive Features That Contribute To Risk:  None    Suicide Risk:  Minimal: No identifiable suicidal ideation.  Patients presenting with no risk factors but with morbid ruminations; may be classified as minimal risk based on the severity of the depressive symptoms    Plan Of Care/Follow-up recommendations:  Activity:  as tolerated Diet:  heart healthy diet  Yvonne Vandergriff, NP 02/17/2017, 3:45 PM

## 2017-02-17 NOTE — Progress Notes (Signed)
02/16/2017 Approximately 1:40pm intern and LRT went into pts room to offer activities. Pt remembered intern and LRT [facility]. Pt talked to intern and LRT for approximately 30 minutes. Pt appeared to be very manic. Pt talked about how she is related to [place] and Donn PieriniMichael Jackson. Pt reported that she passed out in your father's yard, who had passed away. Pt reported she has been having nightmares about her deceased son and father. Pt reported she should not be in the hospital because she has places to be, such as getting ready for her wedding. Pt reported she has several places to travel to within the year. Pt offered to take intern and LRT on her trips and pay for them as well as buy them clothes and accessories. Pt then spoke to a newly admitted pt and told this pt she was not crazy, and how she would take care of the newly admitted pt post d/c. Pt also offered to buy the new pt many new things and take the "baby daddy" to the child support office. Intern gave pt a book, two magazines, coloring pages and four word searches. Pt continued to speak with intern and LRT in the hallway and offered to take them to concerts and [place].  Marvell Fullerachel Isebella Upshur, Recreation Therapy Intern

## 2017-02-17 NOTE — Consult Note (Signed)
[facility]   Reason for Consult:  Mania  Referring Physician:  EDP Patient Identification: Yvonne BalsamMelvine [name] MRN:  [id number] Principal Diagnosis: Bipolar affective disorder, current episode mild (HCC) Diagnosis:   Patient Active Problem List   Diagnosis Date Noted  . Bipolar affective disorder, current episode mild (HCC) [F31.9] 01/05/2017    Priority: High  . 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 Manic behavior (HCC) [F30.10]   . Facial cellulitis [L03.211] 02/10/2014  . MDD (major depressive disorder) [F32.9] 08/02/2012    Total Time spent with patient: 30 minutes  Subjective:   Yvonne [name] is a 51 y.o. female patient has stabilized.  HPI:  51 yo female who presented to the ED with mania.  Medications were started and she stabilized.  Yvonne [name] is well known to the providers and is at her baseline.  No suicidal/homicidal ideations, hallucinations, or alcohol/drug abuse.  She requests to leave, encouraged to continue to take her medications.  STable for discharge.  Past Psychiatric History: bipolar disorder  Risk to Self: None Risk to Others: None Prior Inpatient Therapy: Prior Inpatient Therapy:  (UTA) Prior Therapy Dates: UTA Prior Therapy Facilty/Provider(s): UTA Reason for Treatment: UTA Prior Outpatient Therapy: Prior Outpatient Therapy:  ([facility]A) Prior Therapy Dates: UTA Prior Therapy Facilty/Provider(s): UTA Reason for Treatment: UTA Does patient have an ACCT team?: Unknown Does patient have Intensive In-House Services?  : Unknown Does patient have [facility] services? : Unknown Does patient have P4CC services?: Unknown  Past Medical History:  Past Medical History:  Diagnosis Date  . Bipolar 1 disorder (HCC)   . Medical history non-contributory   . Mental disorder   . No pertinent past medical history     Past Surgical History:  Procedure Laterality Date  . NO PAST SURGERIES     Family History:  Family History   Problem Relation Age of Onset  . Depression Sister   . Depression Brother   . Depression Sister   . CAD Mother   . Hypertension Father   . Diabetes Father   . Bipolar disorder Cousin    Family Psychiatric  History: unknown Social History:  History  Alcohol Use No     History  Drug Use No    Social History   Social History  . Marital status: Single    Spouse name: N/A  . Number of children: N/A  . Years of education: N/A   Social History Main Topics  . Smoking status: Never Smoker  . Smokeless tobacco: Never Used  . Alcohol use No  . Drug use: No  . Sexual activity: No   Other Topics Concern  . Not on file   Social History Narrative  . No narrative on file   Additional Social History:    Allergies:  No Known Allergies  Labs: No results found for this or any previous visit (from the past 48 hour(s)).  Current Facility-Administered Medications  Medication Dose Route Frequency Provider Last Rate Last Dose  . acetaminophen (TYLENOL) tablet 650 mg  650 mg Oral Q4H PRN Arby BarrettePfeiffer, Marcy, MD      . alum & mag hydroxide-simeth (MAALOX/MYLANTA) 200-200-20 MG/5ML suspension           . carbamazepine (TEGRETOL XR) 12 hr tablet 200 mg  200 mg Oral BID Jannifer FranklinAkintayo, Vergia Chea, MD   200 mg at 02/14/17 1258  . hydrOXYzine (ATARAX/VISTARIL) tablet 25 mg  25 mg Oral TID Thedore MinsAkintayo, Maree Ainley, MD      . ondansetron (  ZOFRAN) tablet 4 mg  4 mg Oral Q8H PRN Arby Barrette, MD      . potassium chloride SA (K-DUR,KLOR-CON) CR tablet 40 mEq  40 mEq Oral Once Arby Barrette, MD      . risperiDONE (RISPERDAL) tablet 1 mg  1 mg Oral BID Jannifer Franklin, Darly Massi, MD   1 mg at 02/14/17 1258  . traZODone (DESYREL) tablet 50 mg  50 mg Oral QHS Thedore Mins, MD       Current Outpatient Prescriptions  Medication Sig Dispense Refill  . carbamazepine (TEGRETOL XR) 200 MG 12 hr tablet Take 1 tablet (200 mg total) by mouth 2 (two) times daily. For mood stabilization 60 tablet 0  . hydrOXYzine  (ATARAX/VISTARIL) 25 MG tablet Take 1 tablet (25 mg total) by mouth every 6 (six) hours as needed for anxiety. 60 tablet 0  . propranolol (INDERAL) 10 MG tablet Take 1 tablet (10 mg total) by mouth 2 (two) times daily. For anxiety 60 tablet 0  . traZODone (DESYREL) 50 MG tablet Take 1 tablet (50 mg total) by mouth at bedtime as needed for sleep. 30 tablet 0    Musculoskeletal: Strength & Muscle Tone: within normal limits Gait & Station: normal Patient leans: N/A  Psychiatric Specialty Exam: Physical Exam  Constitutional: She is oriented to person, place, and time. She appears well-developed and well-nourished.  HENT:  Head: Normocephalic.  Neck: Normal range of motion.  Respiratory: Effort normal.  Musculoskeletal: Normal range of motion.  Neurological: She is alert and oriented to person, place, and time.  Psychiatric: She has a normal mood and affect. Her speech is normal and behavior is normal. Judgment and thought content normal. Cognition and memory are normal.    Review of Systems  All other systems reviewed and are negative.   Blood pressure (!) 92/59, pulse 75, temperature 99.2 F (37.3 C), temperature source Oral, resp. rate 16, SpO2 100 %.There is no height or weight on file to calculate BMI.  General Appearance: Casual  Eye Contact:  Good  Speech:  Normal Rate  Volume:  Normal  Mood:  Euthymic  Affect:  Congruent  Thought Process:  Coherent and Descriptions of Associations: Intact  Orientation:  Full (Time, Place, and Person)  Thought Content:  WDL and Logical  Suicidal Thoughts:  No  Homicidal Thoughts:  No  Memory:  Immediate;   Good Recent;   Good Remote;   Good  Judgement:  Fair  Insight:  Fair  Psychomotor Activity:  Normal  Concentration:  Concentration: Good and Attention Span: Good  Recall:  Good  Fund of Knowledge:  Fair  Language:  Good  Akathisia:  No  Handed:  Right  AIMS (if indicated):     Assets:  Leisure Time Physical  Health Resilience Social Support  ADL's:  Intact  Cognition:  WNL  Sleep:        Treatment Plan Summary: Daily contact with patient to assess and evaluate symptoms and progress in treatment, Medication management and Plan bipolar affective disorder, mixed mild:  -Crisis stabilization -Medication management:  Continued Risperdal 1 mg BID for psychosis, Trazodone 50 mg at bedtime for sleep, Tegretol 200 mg BID for mood stabilization, and Vistaril 25 mg TID for anxiety -Individual counseling  Disposition: No evidence of imminent risk to self or others at present.    [name], NP 02/17/2017 9:31 AM  Patient seen face-to-face for psychiatric evaluation, chart reviewed and case discussed with the physician extender and developed treatment plan. Reviewed the information documented  and agree with the treatment plan. Corena Pilgrim, MD

## 2017-02-17 NOTE — ED Notes (Signed)
Pt d/c home per MD order. Discharge summary reviewed with pt, RX provided. Pt denies SI/HI/AVH. Pt signed for personal property and property returned. Pt signed e-signature. Ambulatory off unit with [facility].

## 2017-04-07 ENCOUNTER — Emergency Department (HOSPITAL_COMMUNITY)
Admission: EM | Admit: 2017-04-07 | Discharge: 2017-04-07 | Payer: Medicaid Other | Attending: Emergency Medicine | Admitting: Emergency Medicine

## 2017-04-07 ENCOUNTER — Encounter (HOSPITAL_COMMUNITY): Payer: Self-pay | Admitting: Emergency Medicine

## 2017-04-07 DIAGNOSIS — Z79899 Other long term (current) drug therapy: Secondary | ICD-10-CM | POA: Diagnosis not present

## 2017-04-07 DIAGNOSIS — F22 Delusional disorders: Secondary | ICD-10-CM | POA: Insufficient documentation

## 2017-04-07 DIAGNOSIS — R634 Abnormal weight loss: Secondary | ICD-10-CM | POA: Diagnosis present

## 2017-04-07 LAB — COMPREHENSIVE METABOLIC PANEL
ALT: 16 U/L (ref 14–54)
AST: 30 U/L (ref 15–41)
Albumin: 3.6 g/dL (ref 3.5–5.0)
Alkaline Phosphatase: 57 U/L (ref 38–126)
Anion gap: 7 (ref 5–15)
BUN: 13 mg/dL (ref 6–20)
CHLORIDE: 108 mmol/L (ref 101–111)
CO2: 29 mmol/L (ref 22–32)
Calcium: 9.1 mg/dL (ref 8.9–10.3)
Creatinine, Ser: 0.54 mg/dL (ref 0.44–1.00)
Glucose, Bld: 122 mg/dL — ABNORMAL HIGH (ref 65–99)
POTASSIUM: 3.1 mmol/L — AB (ref 3.5–5.1)
SODIUM: 144 mmol/L (ref 135–145)
Total Bilirubin: 0.6 mg/dL (ref 0.3–1.2)
Total Protein: 7.7 g/dL (ref 6.5–8.1)

## 2017-04-07 LAB — CBC WITH DIFFERENTIAL/PLATELET
BASOS ABS: 0 10*3/uL (ref 0.0–0.1)
Basophils Relative: 1 %
EOS PCT: 1 %
Eosinophils Absolute: 0 10*3/uL (ref 0.0–0.7)
HEMATOCRIT: 32 % — AB (ref 36.0–46.0)
HEMOGLOBIN: 10.7 g/dL — AB (ref 12.0–15.0)
LYMPHS PCT: 36 %
Lymphs Abs: 1.3 10*3/uL (ref 0.7–4.0)
MCH: 27.9 pg (ref 26.0–34.0)
MCHC: 33.4 g/dL (ref 30.0–36.0)
MCV: 83.3 fL (ref 78.0–100.0)
MONOS PCT: 16 %
Monocytes Absolute: 0.6 10*3/uL (ref 0.1–1.0)
Neutro Abs: 1.7 10*3/uL (ref 1.7–7.7)
Neutrophils Relative %: 46 %
Platelets: 246 10*3/uL (ref 150–400)
RBC: 3.84 MIL/uL — AB (ref 3.87–5.11)
RDW: 13.3 % (ref 11.5–15.5)
WBC: 3.6 10*3/uL — AB (ref 4.0–10.5)

## 2017-04-07 LAB — RAPID URINE DRUG SCREEN, HOSP PERFORMED
AMPHETAMINES: NOT DETECTED
BARBITURATES: NOT DETECTED
BENZODIAZEPINES: NOT DETECTED
COCAINE: NOT DETECTED
Opiates: NOT DETECTED
Tetrahydrocannabinol: NOT DETECTED

## 2017-04-07 MED ORDER — ZOLPIDEM TARTRATE 5 MG PO TABS: 5.0000 mg | ORAL_TABLET | Freq: Every evening | ORAL | Status: DC | PRN

## 2017-04-07 NOTE — ED Notes (Signed)
Bed: WA30 Expected date:  Expected time:  Means of arrival:  Comments: 

## 2017-04-07 NOTE — ED Provider Notes (Signed)
[facility]-EMERGENCY DEPT Provider Note   CSN: [id number] Arrival date & time: 04/07/17  1429     History   Chief Complaint Chief Complaint  Patient presents with  . Weight Loss    HPI Yvonne [name] is a 51 y.o. female.  HPI Patient presents to the emergency department with weight loss and is very manic and hyperactive with hyperverbal responses.  Patient will not give me any history.  She does state that she wants something to eat but that is the only relevant history she will give me the patient has not been eating at the facility and therefore they were concerned about her health.  Patient only give me any history due to her severe mania Past Medical History:  Diagnosis Date  . Bipolar 1 disorder (HCC)   . Medical history non-contributory   . Mental disorder   . No pertinent past medical history     Patient Active Problem List   Diagnosis Date Noted  . 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 Bipolar affective disorder, current episode mild (HCC) 01/05/2017  . Manic behavior (HCC)   . Facial cellulitis 02/10/2014  . MDD (major depressive disorder) 08/02/2012    Past Surgical History:  Procedure Laterality Date  . NO PAST SURGERIES      OB History    No data available       Home Medications    Prior to Admission medications   Medication Sig Start Date End Date Taking? Authorizing Provider  carbamazepine (TEGRETOL XR) 200 MG 12 hr tablet Take 1 tablet (200 mg total) by mouth 2 (two) times daily. For mood stabilization 01/14/17   Armandina Stammer I, NP  hydrOXYzine (ATARAX/VISTARIL) 25 MG tablet Take 1 tablet (25 mg total) by mouth every 6 (six) hours as needed for anxiety. 01/14/17   Armandina Stammer I, NP  propranolol (INDERAL) 10 MG tablet Take 1 tablet (10 mg total) by mouth 2 (two) times daily. For anxiety 01/14/17   Armandina Stammer I, NP  risperiDONE (RISPERDAL) 1 MG tablet Take 1 tablet (1 mg total) by mouth 2 (two) times daily. 02/17/17   Charm Rings, NP    traZODone (DESYREL) 50 MG tablet Take 1 tablet (50 mg total) by mouth at bedtime as needed for sleep. 01/14/17   [name], NP    Family History Family History  Problem Relation Age of Onset  . Depression Sister   . Depression Brother   . Depression Sister   . CAD Mother   . Hypertension Father   . Diabetes Father   . Bipolar disorder Cousin     Social History Social History  Substance Use Topics  . Smoking status: Never Smoker  . Smokeless tobacco: Never Used  . Alcohol use No     Allergies   Patient has no known allergies.   Review of Systems Review of Systems Level V caveat applies due to her severe mental illness and uncooperativeness  Physical Exam Updated Vital Signs BP 138/70 (BP Location: Right Arm)   Pulse 95   Temp 97.9 F (36.6 C) (Oral)   Resp 20   SpO2 100%   Physical Exam  Constitutional: She is oriented to person, place, and time. She appears well-developed and well-nourished. No distress.  HENT:  Head: Normocephalic and atraumatic.  Eyes: Pupils are equal, round, and reactive to light.  Cardiovascular: Normal rate, regular rhythm and normal heart sounds.  Exam reveals no friction rub.   No murmur heard.  Pulmonary/Chest: Effort normal and breath sounds normal. No respiratory distress. She has no wheezes.  Neurological: She is alert and oriented to person, place, and time.  Skin: Skin is warm and dry.  Psychiatric: She has a normal mood and affect.  Nursing note and vitals reviewed.    ED Treatments / Results  Labs (all labs ordered are listed, but only abnormal results are displayed) Labs Reviewed  COMPREHENSIVE METABOLIC PANEL - Abnormal; Notable for the following:       Result Value   Potassium 3.1 (*)    Glucose, Bld 122 (*)    All other components within normal limits  CBC WITH DIFFERENTIAL/PLATELET - Abnormal; Notable for the following:    WBC 3.6 (*)    RBC 3.84 (*)    Hemoglobin 10.7 (*)    HCT 32.0 (*)    All other  components within normal limits  RAPID URINE DRUG SCREEN, HOSP PERFORMED  ETHANOL    EKG  EKG Interpretation None       Radiology No results found.  Procedures Procedures (including critical care time)  Medications Ordered in ED Medications  zolpidem (AMBIEN) tablet 5 mg (not administered)     Initial Impression / Assessment and Plan / ED Course  I have reviewed the triage vital signs and the nursing notes.  Pertinent labs & imaging results that were available during my care of the patient were reviewed by me and considered in my medical decision making (see chart for details).     The [facility] states that they have a psychiatrist at that facility they can manage her.  They were mainly concerned about her eating.  The patient does not have any significant signs of malnourishment were not dehydration this point.  Her vital signs within normal limits.  Our TTS assessed the patient.  They felt that the [facility] and be [facility] by their psychiatrist.  Patient most likely the medication adjustments will also potentially help with her eating habits  Final Clinical Impressions(s) / ED Diagnoses   Final diagnoses:  None    New Prescriptions New Prescriptions   No medications on file     Charlestine Night, Cordelia Poche 04/07/17 2210    Donnetta Hutching, MD 04/07/17 2317

## 2017-04-07 NOTE — ED Triage Notes (Signed)
[facility] from [facility]-states recent weight loss-120 lbs to 90lbs-patient appears to be manic, hyper verbal, unable to focus-easily redirected although not for long periods of time

## 2017-04-07 NOTE — BH Assessment (Signed)
Assessment Note  Yvonne [name] is an 51 y.o. female.  -Patient is at [facility] in [facility] custody.  Patient is in handcuffs on the bed.  Two deputies are present.  Patient is manic.  Talking about parrots, the zoo, paddle boats.  She has severe flight of ideas.  She talks about being hungry and not eating for a few days.  Patient is unable answer a question directly.  She knows she is at [facility].    Deputy Paulla Fore said that she has a court date sometime in October.  He was not sure why she is in jail currently.  He speculated it may be a failure to appear charge but he was not sure.  Patient, according to the other officer, will pick at her food.  She will eat a few bites and throw away the rest.  Patient was at [facility] on [address] in June of 2018.  She has had two subsequent assessments and was discharged after patient was stabilized.  -Clinician discussed patient care with Lindon Romp, FNP.  He recommended patient be monitored overnight and be seen by psychiatry after monitoring for safety.  Clinician talked to Irena Cords, PA who said that was fine.  Clinician informed officer Paulla Fore (sheriff's dept) of patient staying overnight.  He called his sergeant (Sgt. Klipsteine) who said that patient had psychiatrists at the jail that will continue to monitor her.  He said that patient was at her baseline for her time with them.  Sgt Klipsteine said that she had been sent to [facility] because she has not been eating well for the last couple of weeks and has had weight loss.  He said that they sent her for medical, not psychiatric, reasons.  Sgt said that patient will continue to be followed by their psychiatrists at the jail.  Clinician talked with Irena Cords again and he said they will go ahead and discharge patient back to jail since she can have her psychiatric needs met there and there is no medical need to keep her in the [facility].  Diagnosis: Bi polar 1 d/o manic episode  Past Medical History:  Past Medical  History:  Diagnosis Date  . Bipolar 1 disorder ([place])   . Medical history non-contributory   . Mental disorder   . No pertinent past medical history     Past Surgical History:  Procedure Laterality Date  . NO PAST SURGERIES      Family History:  Family History  Problem Relation Age of Onset  . Depression Sister   . Depression Brother   . Depression Sister   . CAD Mother   . Hypertension Father   . Diabetes Father   . Bipolar disorder Cousin     Social History:  reports that she has never smoked. She has never used smokeless tobacco. She reports that she does not drink alcohol or use drugs.  Additional Social History:  Alcohol / Drug Use Pain Medications: See med list from detention officers Prescriptions: See med list from detention officers Over the Counter: See med list from detention officers. 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 Patient is not currently having periods (Reason: Perimenopausal).  General Assessment Data [facility] of [facility]: [facility] TTS Assessment: In system Is this a  Tele or Face-to-Face Assessment?: Face-to-Face Is this an Initial Assessment or a Re-assessment for this encounter?: Initial Assessment Marital status: Other (comment) (Pt can't answer.) Is patient pregnant?: No Pregnancy Status: No Living Arrangements: Other (Comment) [facility]) Can pt return to current living arrangement?: Yes (Currently in Sheriff's custody.) Admission Status: Voluntary Is patient capable of signing voluntary admission?: No Referral Source: Other (Sheriff's Dept.) Insurance type: [facility]     Crisis Care Plan Living Arrangements: Other (Comment) [facility] [employer]) Name of Psychiatrist: None Name of Therapist:  None  Education Status Is patient currently in school?: No Highest grade of school patient has completed: UTA  Risk to self with the past 6 months Suicidal Ideation: No Has patient been a risk to self within the past 6 months prior to admission? : No Suicidal Intent: No Has patient had any suicidal intent within the past 6 months prior to admission? : No Is patient at risk for suicide?: No Suicidal Plan?: No Has patient had any suicidal plan within the past 6 months prior to admission? : No Access to Means: No What has been your use of drugs/alcohol within the last 12 months?: UDS clear Previous Attempts/Gestures: No How many times?: 0 Other Self Harm Risks: Yes Triggers for Past Attempts: None known Intentional Self Injurious Behavior:  (Pt reportedly not eating well.) Family Suicide History: Unable to assess Recent stressful life event(s): Other (Comment) (Chronic, severe mental illness) Persecutory voices/beliefs?:  Pincus Badder) Depression:  (UTA) Depression Symptoms:  (Unknown) Substance abuse history and/or treatment for substance abuse?:  (Unknown) Suicide prevention information given to non-admitted patients: Not applicable  Risk to Others within the past 6 months Homicidal Ideation: No Does patient have any lifetime risk of violence toward others beyond the six months prior to admission? : No Thoughts of Harm to Others: No Current Homicidal Intent: No Current Homicidal Plan: No Access to Homicidal Means: No Identified Victim: No one History of harm to others?:  (Unknown) Assessment of Violence: None Noted (Unknown) Violent Behavior Description: UTA Does patient have access to weapons?: No Criminal Charges Pending?:  (Unknown) Does patient have a court date: Yes Court Date:  (Unknown, pt is in Sheriff's custody.) Is patient on probation?: Unknown  Psychosis Hallucinations: Auditory, Visual Delusions: Unspecified  Mental Status Report Appearance/Hygiene:  Disheveled Eye Contact: Good Motor Activity: Agitation, Hyperactivity, Restlessness, Other (Comment), Tics (Pt in handcuffs) Speech: Incoherent, Rapid Level of Consciousness: Alert Mood: Anxious, Apprehensive, Helpless Affect: Anxious, Apprehensive Anxiety Level: Severe Thought Processes: Irrelevant, Flight of Ideas Judgement: Impaired Orientation: Unable to assess Obsessive Compulsive Thoughts/Behaviors: Unable to Assess  Cognitive Functioning Concentration: Poor Memory: Unable to Assess IQ: Average Insight: Poor Impulse Control: Poor Appetite: Poor Weight Loss:  (Reports not eating for days) Weight Gain: 0 Sleep: Unable to Assess Total Hours of Sleep:  (Pt cannot answer appropriately) Vegetative Symptoms: Unable to Assess  ADLScreening [facility]) Patient's cognitive ability adequate to safely complete daily activities?: Yes Patient able to express need for assistance with ADLs?: Yes Independently performs ADLs?: Yes (appropriate for developmental age)  Prior Inpatient Therapy Prior Inpatient Therapy: Yes Prior Therapy Dates: 12/2016 Prior Therapy Facilty/Provider(s): [facility] Reason for Treatment: psychosis  Prior Outpatient Therapy Prior Outpatient Therapy:  (UTA) Prior Therapy Dates: UTA Prior Therapy Facilty/Provider(s): UTA Reason for Treatment: UTA Does patient have an ACCT team?: Unknown Does patient have Intensive In-House Services?  : Unknown Does patient have [facility] services? : Unknown Does patient have P4CC services?: No  ADL Screening (condition at time of  admission) Patient's cognitive ability adequate to safely complete daily activities?: Yes Is the patient deaf or have difficulty hearing?: No Does the patient have difficulty seeing, even when wearing glasses/contacts?: No Does the patient have difficulty concentrating, remembering, or making decisions?: Yes Patient able to express need for assistance with ADLs?: Yes Does the patient have  difficulty dressing or bathing?: No Independently performs ADLs?: Yes (appropriate for developmental age) Does the patient have difficulty walking or climbing stairs?: No Weakness of Legs: None Weakness of Arms/Hands: None       Abuse/Neglect Assessment (Assessment to be complete while patient is alone) Physical Abuse: Denies (Pt cannot answer.) Verbal Abuse: Denies (Pt cannot answer directly.) Sexual Abuse: Denies (Pt cannot answer directly.) Exploitation of patient/patient's resources: Denies Self-Neglect: Denies     Regulatory affairs officer (For Healthcare) Does Patient Have a Medical Advance Directive?: No Would patient like information on creating a medical advance directive?: No - Patient declined    Additional Information 1:1 In Past 12 Months?: No CIRT Risk: Yes Elopement Risk: Yes Does patient have medical clearance?: Yes     Disposition:  Disposition Initial Assessment Completed for this Encounter: Yes Disposition of Patient: Other dispositions Other disposition(s):  (Pt is in custody of [place].)  On Site Evaluation by:   Reviewed with Physician:    Curlene Dolphin Ray 04/07/2017 9:34 PM

## 2017-04-22 ENCOUNTER — Emergency Department (HOSPITAL_COMMUNITY)
Admission: EM | Admit: 2017-04-22 | Discharge: 2017-05-02 | Disposition: A | Discharge status: Home / Self Care | Payer: Medicaid Other | Attending: Emergency Medicine | Admitting: Emergency Medicine

## 2017-04-22 ENCOUNTER — Encounter (HOSPITAL_COMMUNITY): Payer: Self-pay | Admitting: *Deleted

## 2017-04-22 DIAGNOSIS — F3189 Other bipolar disorder: Secondary | ICD-10-CM

## 2017-04-22 DIAGNOSIS — F3010 Manic episode without psychotic symptoms, unspecified: Secondary | ICD-10-CM

## 2017-04-22 DIAGNOSIS — F329 Major depressive disorder, single episode, unspecified: Secondary | ICD-10-CM | POA: Diagnosis not present

## 2017-04-22 DIAGNOSIS — F312 Bipolar disorder, current episode manic severe with psychotic features: Secondary | ICD-10-CM | POA: Diagnosis present

## 2017-04-22 DIAGNOSIS — Z008 Encounter for other general examination: Secondary | ICD-10-CM

## 2017-04-22 DIAGNOSIS — F319 Bipolar disorder, unspecified: Secondary | ICD-10-CM | POA: Diagnosis present

## 2017-04-22 DIAGNOSIS — Z79899 Other long term (current) drug therapy: Secondary | ICD-10-CM | POA: Diagnosis not present

## 2017-04-22 DIAGNOSIS — Z8659 Personal history of other mental and behavioral disorders: Secondary | ICD-10-CM

## 2017-04-22 LAB — CBC WITH DIFFERENTIAL/PLATELET
BASOS ABS: 0 10*3/uL (ref 0.0–0.1)
Basophils Relative: 0 %
EOS ABS: 0 10*3/uL (ref 0.0–0.7)
Eosinophils Relative: 0 %
HEMATOCRIT: 30.8 % — AB (ref 36.0–46.0)
HEMOGLOBIN: 10.1 g/dL — AB (ref 12.0–15.0)
LYMPHS PCT: 20 %
Lymphs Abs: 1 10*3/uL (ref 0.7–4.0)
MCH: 28.1 pg (ref 26.0–34.0)
MCHC: 32.8 g/dL (ref 30.0–36.0)
MCV: 85.6 fL (ref 78.0–100.0)
MONOS PCT: 12 %
Monocytes Absolute: 0.6 10*3/uL (ref 0.1–1.0)
NEUTROS ABS: 3.3 10*3/uL (ref 1.7–7.7)
NEUTROS PCT: 68 %
Platelets: 301 10*3/uL (ref 150–400)
RBC: 3.6 MIL/uL — AB (ref 3.87–5.11)
RDW: 13.9 % (ref 11.5–15.5)
WBC: 4.9 10*3/uL (ref 4.0–10.5)

## 2017-04-22 LAB — RAPID URINE DRUG SCREEN, HOSP PERFORMED
Amphetamines: NOT DETECTED
BARBITURATES: NOT DETECTED
Benzodiazepines: NOT DETECTED
COCAINE: NOT DETECTED
Opiates: NOT DETECTED
TETRAHYDROCANNABINOL: NOT DETECTED

## 2017-04-22 LAB — COMPREHENSIVE METABOLIC PANEL
ALBUMIN: 3.4 g/dL — AB (ref 3.5–5.0)
ALK PHOS: 70 U/L (ref 38–126)
ALT: 24 U/L (ref 14–54)
ANION GAP: 8 (ref 5–15)
AST: 40 U/L (ref 15–41)
BILIRUBIN TOTAL: 0.5 mg/dL (ref 0.3–1.2)
BUN: 29 mg/dL — ABNORMAL HIGH (ref 6–20)
CALCIUM: 9.3 mg/dL (ref 8.9–10.3)
CO2: 28 mmol/L (ref 22–32)
CREATININE: 0.67 mg/dL (ref 0.44–1.00)
Chloride: 105 mmol/L (ref 101–111)
GFR calc Af Amer: >60 mL/min (ref >60)
GFR calc non Af Amer: >60 mL/min (ref >60)
GLUCOSE: 87 mg/dL (ref 65–99)
Potassium: 4.5 mmol/L (ref 3.5–5.1)
SODIUM: 141 mmol/L (ref 135–145)
TOTAL PROTEIN: 7.7 g/dL (ref 6.5–8.1)

## 2017-04-22 LAB — SALICYLATE LEVEL

## 2017-04-22 LAB — ETHANOL: Alcohol, Ethyl (B): <10 mg/dL (ref <10)

## 2017-04-22 LAB — ACETAMINOPHEN LEVEL

## 2017-04-22 MED ORDER — TRAZODONE HCL 50 MG PO TABS
50.0000 mg | ORAL_TABLET | Freq: Every day | ORAL | Status: DC
  Administered 2017-04-24 – 2017-05-01 (×4): 50 mg via ORAL
  Filled 2017-04-22 (×6): qty 1

## 2017-04-22 MED ORDER — ACETAMINOPHEN 325 MG PO TABS: 650.0000 mg | ORAL_TABLET | ORAL | Status: DC | PRN

## 2017-04-22 MED ORDER — OLANZAPINE 10 MG PO TBDP
10.0000 mg | ORAL_TABLET | Freq: Every day | ORAL | Status: DC
  Administered 2017-04-22: 10 mg via ORAL
  Filled 2017-04-22: qty 1

## 2017-04-22 NOTE — ED Triage Notes (Signed)
Pt brought in by [facility] Dept under IVC which was initiated by the Sheriff's Dept for hx of anxiety, depression, bipolar, refusing to eat, take meds.

## 2017-04-22 NOTE — ED Notes (Signed)
Per [facility], pt had all charges dropped today and was sent here for psych evaluation.

## 2017-04-22 NOTE — BH Assessment (Addendum)
Assessment Note  Yvonne [name] is an 51 y.o. female. Patient was brought in by [facility], petitioned by court liaison. TTS attempted to assess patient, patient displaying rapid pressured speech, rambling and loose associations. Patient spoke nonstop with loose associations. Patient made reference to counselor gathering up all objects on counter at nurses station to give to her cousin. Patient stated that counselor was batman's twin. Patient continued to make reference to unknown people and events. Patient UDS was negative.  Per IVC, "Respondent is diagnosed with anxiety, depression, bi-polar. Takes zyprex medication. She was committed for mental health issues May 2018, she is now acting aggressive toward jail and medical staff, she is refusing medical treatment and refusing to eat and take prescribed medication, she has a history of suicidal ideations, she is hallucinating, thinks she is working at school for kids, proving them with clothes, flushing linens down the toilet, she has lost thirty pounds in a short time due to refusing to eat and take medications, she refuses to wear clothes, or pratice hygiene. She is unable to fulfill her basic life needs at this time (food, clothing, shelter), having extreme behavior issues, she is a danger to herself and possibly others at this time."  Diagnosis: Bi polar 1 d/o manic episode  Past Medical History:  Past Medical History:  Diagnosis Date  . Bipolar 1 disorder ([facility])   . Medical history non-contributory   . Mental disorder   . No pertinent past medical history     Past Surgical History:  Procedure Laterality Date  . NO PAST SURGERIES      Family History:  Family History  Problem Relation Age of Onset  . Depression Sister   . Depression Brother   . Depression Sister   . CAD Mother   . Hypertension Father   . Diabetes Father   . Bipolar disorder Cousin     Social History:  reports that she has never smoked. She has never used smokeless  tobacco. She reports that she does not drink alcohol or use drugs.  Additional Social History:  Alcohol / Drug Use Pain Medications: see Mar Prescriptions: see Mar Over the Counter: see Mar  CIWA: CIWA-Ar BP: 133/78 Pulse Rate: (!) 104 COWS:    Allergies: No Known Allergies  Home Medications:  (Not in a hospital admission)  OB/GYN Status:  No LMP recorded. Patient is not currently having periods (Reason: Perimenopausal).  General Assessment Data [facility] Assessment: In system Is this a Tele or Face-to-Face Assessment?: Face-to-Face Is this an Initial Assessment or a Re-assessment for this encounter?: Initial Assessment Marital status:  (unable to assess) Is patient pregnant?: Unknown Pregnancy Status: Unknown Living Arrangements:  (Charges dropped, patient released from jail today.) Can pt return to current living arrangement?: No Admission Status: Involuntary Is patient capable of signing voluntary admission?: No Referral Source: Other [facility] [employer]) Community education officer type:  (unknown)  Medical Screening Exam ([facility] Walk-in ONLY) Medical Exam completed: Yes  Crisis Care Plan Living Arrangements:  (Charges dropped, patient released from jail today.) Legal Guardian:  (unable to assess) Name of Psychiatrist:  (unable to assess) Name of Therapist:  (unable to assess)  Education Status Is patient currently in school?:  (unable to assess) Current Grade:  (unable to assess) Highest grade of school patient has completed: UTA Name of school:  [employer]) Contact person:  (uta)  Risk to self with the past 6 months Suicidal Ideation:  (unable to assess) Has patient been a risk to  self within the past 6 months prior to admission? :  (unable to assess) Suicidal Intent:  Rich Reining) Has patient had any suicidal intent within the past 6 months prior to admission? :  (uta) Is patient at risk for suicide?:  (uta) Suicidal Plan?:  (uta) Has patient had any  suicidal plan within the past 6 months prior to admission? :  (uta) Access to Means:  Rich Reining) What has been your use of drugs/alcohol within the last 12 months?:  (uta) Previous Attempts/Gestures:  Rich Reining) How many times?:  (uta) Other Self Harm Risks:  (uta) Triggers for Past Attempts:  (uta) Intentional Self Injurious Behavior:  (uta) Family Suicide History: Unable to assess Recent stressful life event(s): Legal Issues Persecutory voices/beliefs?:  Rich Reining) Depression:  [employer] Reining) Depression Symptoms:  (uta) Substance abuse history and/or treatment for substance abuse?:  (uta) Suicide prevention information given to non-admitted patients:  (uta)  Risk to Others within the past 6 months Homicidal Ideation:  (uta) Does patient have any lifetime risk of violence toward others beyond the six months prior to admission? : Unknown Thoughts of Harm to Others:  (Per IVC, aggressive towards jail and medical staff.) Current Homicidal Intent:  Rich Reining) Current Homicidal Plan:  (uta) Access to Homicidal Means:  Rich Reining) Identified Victim:  (uta) History of harm to others?:  (uta) Assessment of Violence:  (unable to assess) Violent Behavior Description:  (uta) Does patient have access to weapons?:  Rich Reining) Criminal Charges Pending?:  (Charges dropped today.) Does patient have a court date: No Court Date:  (Today, charges dropped today.) Is patient on probation?: Unknown  Psychosis Hallucinations: Patent attorney, Auditory Delusions: Unspecified  Mental Status Report Appearance/Hygiene: Unremarkable Eye Contact: Fair Motor Activity: Agitation Speech: Rapid, Pressured, Loud, Incoherent Level of Consciousness: Alert, Irritable Mood: Anxious, Irritable, Angry Affect: Angry, Irritable Anxiety Level: Severe Thought Processes: Flight of Ideas, Irrelevant Judgement: Impaired Orientation: Unable to assess Obsessive Compulsive Thoughts/Behaviors: Unable to Assess  Cognitive Functioning Concentration: Poor Memory:  Unable to Assess IQ: Average Insight: Poor Impulse Control: Poor Appetite: Poor Sleep: Unable to Assess Total Hours of Sleep:  (uta) Vegetative Symptoms: Unable to Assess  ADLScreening [facility] Assessment Services) Patient's cognitive ability adequate to safely complete daily activities?:  (unable to assess) Patient able to express need for assistance with ADLs?:  (unable to assess) Independently performs ADLs?:  (unable to assess)  Prior Inpatient Therapy Prior Inpatient Therapy: Yes Prior Therapy Dates: 12/2016 Prior Therapy Facilty/Provider(s): [facility] Reason for Treatment: psychosis  Prior Outpatient Therapy Prior Outpatient Therapy:  (unknown) Prior Therapy Dates: UTA Prior Therapy Facilty/Provider(s): UTA Reason for Treatment: UTA Does patient have an ACCT team?: Unknown Does patient have Intensive In-House Services?  : Unknown Does patient have [facility] services? : Unknown Does patient have P4CC services?: Unknown  ADL Screening (condition at time of admission) Patient's cognitive ability adequate to safely complete daily activities?:  (unable to assess) Is the patient deaf or have difficulty hearing?:  (unable to assess) Does the patient have difficulty seeing, even when wearing glasses/contacts?:  (unable to assess) Does the patient have difficulty concentrating, remembering, or making decisions?:  (unable to assess) Patient able to express need for assistance with ADLs?:  (unable to assess) Does the patient have difficulty dressing or bathing?:  (unable to assess) Independently performs ADLs?:  (unable to assess) Does the patient have difficulty walking or climbing stairs?:  (unable to assess) Weakness of Legs:  (unable to assess) Weakness of Arms/Hands:  (unable to assess)       Abuse/Neglect Assessment (Assessment  to be complete while patient is alone) Physical Abuse:  (unable to assess) Verbal Abuse:  (unable to assess) Sexual Abuse:  (unable to  assess) Exploitation of patient/patient's resources:  (unable to assess) Self-Neglect:  (unable to assess) Possible abuse reported to::  (unable to assess)     Advance Directives (For Healthcare) Does Patient Have a Medical Advance Directive?:  (unable to assess) Would patient like information on creating a medical advance directive?:  (unable to assess)    Additional Information 1:1 In Past 12 Months?:  (unknown) CIRT Risk: Yes Elopement Risk: Yes Does patient have medical clearance?: Yes     Disposition: TTS reviewed patient with Celesta Gentile, recommended inpatient treatment. Dr. Jeraldine Loots notified of disposition. Disposition Initial Assessment Completed for this Encounter: Yes Disposition of Patient: Pending Review with psychiatrist  On Site Evaluation by:   Reviewed with Physician:    Burnetta Sabin 04/22/2017 11:01 PM   Al Corpus, MS, [facility], [facility] Triage Specialist [phone]

## 2017-04-22 NOTE — ED Provider Notes (Signed)
[facility]-EM[facility]ENCY DEPT Provider Note   CSN: [id number] Arrival date & time: 04/22/17  1856     History   Chief Complaint Chief Complaint  Patient presents with  . IVC  . Manic Behavior    HPI Yvonne [name] is a 51 y.o. female with a history of bipolar type I who presents from jail with IVC paperwork for fear of being a harm to self and possibly others after charges dropped at court hearing today. Patient paperwork reports that she has been acting aggressive toward jail and medical staff, refusing medical treatment and refusing to eat and take prescribed medication (she is reported to be on Zyprexa). She has a history of SI. Reported to be currently hallucinating, and thinks she is working at a school for kids, providing them with clothes, flushing linens down the toilet. She has reportedly lost 30lbs in a short time due to refusing to eat and take medications. She refuses to wear clothes or practice good hygiene. She is reported to be unable to fulfil her basic needs at this time (food, clothing and shelter). Paperwork notes with her extreme behavior issues is a danger to herself and possibly others.  Patient is in manic state. She has pressured speech, and continually talks about batman and robin. She will not answer questions. Unable to gather history due to patient manic state.   HPI  Past Medical History:  Diagnosis Date  . Bipolar 1 disorder (HCC)   . Medical history non-contributory   . Mental disorder   . No pertinent past medical history     Patient Active Problem List   Diagnosis Date Noted  . 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 Bipolar affective disorder, current episode mild (HCC) 01/05/2017  . Manic behavior (HCC)   . Facial cellulitis 02/10/2014  . MDD (major depressive disorder) 08/02/2012    Past Surgical History:  Procedure Laterality Date  . NO PAST SURGERIES      OB History    No data available       Home Medications    Prior  to Admission medications   Medication Sig Start Date End Date Taking? Authorizing Provider  carbamazepine (TEGRETOL XR) 200 MG 12 hr tablet Take 1 tablet (200 mg total) by mouth 2 (two) times daily. For mood stabilization Patient not taking: Reported on 04/22/2017 01/14/17   Armandina Stammer I, NP  hydrOXYzine (ATARAX/VISTARIL) 25 MG tablet Take 1 tablet (25 mg total) by mouth every 6 (six) hours as needed for anxiety. Patient not taking: Reported on 04/22/2017 01/14/17   Armandina Stammer I, NP  propranolol (INDERAL) 10 MG tablet Take 1 tablet (10 mg total) by mouth 2 (two) times daily. For anxiety Patient not taking: Reported on 04/22/2017 01/14/17   Armandina Stammer I, NP  risperiDONE (RISPERDAL) 1 MG tablet Take 1 tablet (1 mg total) by mouth 2 (two) times daily. Patient not taking: Reported on 04/22/2017 02/17/17   Charm Rings, NP  traZODone (DESYREL) 50 MG tablet Take 1 tablet (50 mg total) by mouth at bedtime as needed for sleep. Patient not taking: Reported on 04/22/2017 01/14/17   [name], NP    Family History Family History  Problem Relation Age of Onset  . Depression Sister   . Depression Brother   . Depression Sister   . CAD Mother   . Hypertension Father   . Diabetes Father   . Bipolar disorder Cousin     Social History Social History  Substance  Use Topics  . Smoking status: Never Smoker  . Smokeless tobacco: Never Used  . Alcohol use No     Allergies   Patient has no known allergies.   Review of Systems Review of Systems Level V caveat due to manic state.  Physical Exam Updated Vital Signs BP 109/66 (BP Location: Left Arm)   Pulse (!) 117   Temp 98.1 F (36.7 C) (Oral)   Resp 20   Ht  (1.6 m)   Wt 38.6 kg (85 lb)   SpO2 100%   BMI 15.06 kg/m   Physical Exam  Constitutional: She appears well-developed and well-nourished.  HENT:  Head: Normocephalic and atraumatic.  Right Ear: External ear normal.  Left Ear: External ear normal.  Eyes: Conjunctivae  are normal. Right eye exhibits no discharge. Left eye exhibits no discharge. No scleral icterus.  Pulmonary/Chest: Effort normal. No respiratory distress.  Neurological: She is alert.  Skin: No pallor.  Psychiatric: Her mood appears anxious. Her speech is rapid and/or pressured. She is agitated.  Nursing note and vitals reviewed.    ED Treatments / Results  Labs (all labs ordered are listed, but only abnormal results are displayed) Labs Reviewed  COMPREHENSIVE METABOLIC PANEL - Abnormal; Notable for the following:       Result Value   BUN 29 (*)    Albumin 3.4 (*)    All other components within normal limits  CBC WITH DIFFERENTIAL/PLATELET - Abnormal; Notable for the following:    RBC 3.60 (*)    Hemoglobin 10.1 (*)    HCT 30.8 (*)    All other components within normal limits  ACETAMINOPHEN LEVEL - Abnormal; Notable for the following:    Acetaminophen (Tylenol), Serum <10 (*)    All other components within normal limits  ETHANOL  RAPID URINE DRUG SCREEN, HOSP PERFORMED  SALICYLATE LEVEL  POC URINE PREG, ED    EKG  EKG Interpretation None       Radiology No results found.  Procedures Procedures (including critical care time)  Medications Ordered in ED Medications  OLANZapine zydis (ZYPREXA) disintegrating tablet 10 mg (10 mg Oral Given 04/22/17 2044)  acetaminophen (TYLENOL) tablet 650 mg (not administered)  traZODone (DESYREL) tablet 50 mg (not administered)     Initial Impression / Assessment and Plan / ED Course  I have reviewed the triage vital signs and the nursing notes.  Pertinent labs & imaging results that were available during my care of the patient were reviewed by me and considered in my medical decision making (see chart for details).     Pt with history of bipolar disorder, currently in manic state sent to ED from jail with IVC paperwork. Unable to obtain history because of patient manic state. Patient reported to be on Zyprexa. Dose given. Med  reconciliation unsuccessful. Appears patient may have been off medication for >1 year after speaking with pharmacy and patient sister. Spoke with Dr. Jeraldine Loots who advised Trazodone and await TTS consult. Patient placed in psych hold awaiting TTS consult.   Final Clinical Impressions(s) / ED Diagnoses   Final diagnoses:  Manic behavior (HCC)  History of bipolar disorder  Encounter for medical clearance for patient hold    New Prescriptions New Prescriptions   No medications on file     Princella Pellegrini 04/22/17 2329    Gerhard Munch, MD 04/23/17 0004

## 2017-04-22 NOTE — ED Notes (Signed)
SBAR Report received from previous nurse. Pt received pressuredand visible on unit. Pt to labile to answer assessment questions and appears otherwise stable and free of distress. Pt unable to accept redirection or reality orientation. Pt reminded of camera surveillance, q 15 min rounds, and rules of the milieu. Pt provided with sandwich and drink. Will continue to assess.

## 2017-04-22 NOTE — ED Notes (Signed)
Bed: [facility] Expected date:  Expected time:  Means of arrival:  Comments: [place]

## 2017-04-22 NOTE — ED Notes (Signed)
Pt with pressured and rapid speech speaking of Batman & Arigato.

## 2017-04-23 MED ORDER — STERILE WATER FOR INJECTION IJ SOLN
INTRAMUSCULAR | Status: AC
  Administered 2017-04-23: 10 mL
  Filled 2017-04-23: qty 10

## 2017-04-23 MED ORDER — ENSURE ENLIVE PO LIQD
237.0000 mL | Freq: Two times a day (BID) | ORAL | Status: DC
  Administered 2017-04-23 – 2017-05-02 (×15): 237 mL via ORAL
  Filled 2017-04-23 (×21): qty 237

## 2017-04-23 MED ORDER — ZIPRASIDONE MESYLATE 20 MG IM SOLR
10.0000 mg | Freq: Once | INTRAMUSCULAR | Status: AC
  Administered 2017-04-23: 10 mg via INTRAMUSCULAR
  Filled 2017-04-23: qty 20

## 2017-04-23 MED ORDER — OLANZAPINE 10 MG IM SOLR
5.0000 mg | Freq: Three times a day (TID) | INTRAMUSCULAR | Status: DC | PRN
  Administered 2017-04-23: 5 mg via INTRAMUSCULAR
  Filled 2017-04-23: qty 10

## 2017-04-23 MED ORDER — DIPHENHYDRAMINE HCL 50 MG/ML IJ SOLN
25.0000 mg | Freq: Once | INTRAMUSCULAR | Status: AC
  Administered 2017-04-23: 25 mg via INTRAMUSCULAR
  Filled 2017-04-23: qty 1

## 2017-04-23 NOTE — ED Notes (Signed)
Introduced self to patient. Pt oriented to unit expectations.  Assessed pt for:  A) Anxiety &/or agitation: Pt has been agitated all morning and is responding to internal stimuli. Her thoughts are disorganized and irrational. She goes on tirades, blaming someone for something, seemingly from the past. Most of the time she goes back to her room and is redirectable, but sometimes she becomes stubborn and difficult to redirect. Zyprexa 5 mg IM was given in left deltoid. Pt was distressed to get it and it required a gently hands on by staff. She appears to have lost a lot of weight and is not very strong physically. She is very fast on her feet. She has been offered food frequently, including an order for Ensure, and appetite is good.    S) Safety: Safety maintained with q-15-minute checks and hourly rounds by staff.  A) ADLs: Pt able to perform ADLs independently.  P) Pick-Up (room cleanliness): Pt's room clean and free of clutter.

## 2017-04-23 NOTE — Progress Notes (Signed)
Per psychiatrist Rene Kocher and NP [facility], patient meets inpatient criteria. CSW faxed patient's referral to: [facility], [facility], [place], [place] and [place].

## 2017-04-23 NOTE — ED Notes (Signed)
Pt hit code blue call light and nurse call light. And continues to respond and ramble to internal stimuli

## 2017-04-23 NOTE — ED Notes (Signed)
SBAR Report received from previous nurse. Pt received calm and visible on unit. Pt asleep and did not answer questions related to current SI/ HI, A/V H, depression, anxiety, or pain at this time, and appears otherwise stable and free of distress with respirations even and unlabored at this time. Pt reminded of camera surveillance, q 15 min rounds, and rules of the milieu. Will continue to assess.

## 2017-04-23 NOTE — ED Notes (Signed)
Pt was roused by another patient and is currently increasing in manic behaviors, coming out into hall yelling out for someone named [place]. Pt redirected back to bed.

## 2017-04-23 NOTE — ED Notes (Signed)
Sometimes pt stays in her room, busy talking to someone(s) not there. Periodically she will come to the nurse's station and hold forth about something that we cannot comprehend. She frequently hits the call bell of whichever room she is in and will cut if off herself. Pt just came out of her room and ran up to a female patient on the telephone and wrapped her self around him, screaming. Staff redirected her to the room and some medication is ordered to help her.

## 2017-04-23 NOTE — ED Notes (Signed)
Yvonne [name] called from [facility] and said that pt has been denied there because of her behaviors.

## 2017-04-23 NOTE — ED Notes (Signed)
Pt continues to respond to self. Pt in room rapidly speaking and pt is alone.

## 2017-04-23 NOTE — Consult Note (Signed)
Patient seen and IVC paperwork completed.

## 2017-04-24 DIAGNOSIS — R451 Restlessness and agitation: Secondary | ICD-10-CM

## 2017-04-24 DIAGNOSIS — Z818 Family history of other mental and behavioral disorders: Secondary | ICD-10-CM

## 2017-04-24 DIAGNOSIS — F3189 Other bipolar disorder: Secondary | ICD-10-CM

## 2017-04-24 MED ORDER — DIPHENHYDRAMINE HCL 50 MG/ML IJ SOLN
50.0000 mg | Freq: Once | INTRAMUSCULAR | Status: AC
  Administered 2017-04-24: 50 mg via INTRAMUSCULAR
  Filled 2017-04-24: qty 1

## 2017-04-24 MED ORDER — LORAZEPAM 2 MG/ML IJ SOLN: 2.0000 mg | Freq: Three times a day (TID) | INTRAMUSCULAR | Status: DC | PRN

## 2017-04-24 MED ORDER — OLANZAPINE 10 MG PO TBDP: 10.0000 mg | ORAL_TABLET | Freq: Two times a day (BID) | ORAL | Status: DC

## 2017-04-24 MED ORDER — STERILE WATER FOR INJECTION IJ SOLN
INTRAMUSCULAR | Status: AC
  Administered 2017-04-24: 09:00:00
  Filled 2017-04-24: qty 10

## 2017-04-24 MED ORDER — ZIPRASIDONE MESYLATE 20 MG IM SOLR
10.0000 mg | Freq: Once | INTRAMUSCULAR | Status: AC
  Administered 2017-04-24: 10 mg via INTRAMUSCULAR
  Filled 2017-04-24: qty 20

## 2017-04-24 MED ORDER — OLANZAPINE 10 MG IM SOLR
10.0000 mg | INTRAMUSCULAR | Status: AC
  Administered 2017-04-24: 10 mg via INTRAMUSCULAR
  Filled 2017-04-24: qty 10

## 2017-04-24 MED ORDER — OLANZAPINE 10 MG IM SOLR: 5.0000 mg | Freq: Two times a day (BID) | INTRAMUSCULAR | Status: DC | PRN

## 2017-04-24 MED ORDER — LORAZEPAM 2 MG/ML IJ SOLN
2.0000 mg | Freq: Once | INTRAMUSCULAR | Status: AC
  Administered 2017-04-24: 2 mg via INTRAMUSCULAR
  Filled 2017-04-24: qty 1

## 2017-04-24 MED ORDER — OLANZAPINE 10 MG PO TBDP
10.0000 mg | ORAL_TABLET | Freq: Two times a day (BID) | ORAL | Status: DC
  Administered 2017-04-24 – 2017-05-01 (×8): 10 mg via ORAL
  Filled 2017-04-24 (×10): qty 1

## 2017-04-24 NOTE — ED Notes (Signed)
Pt manic, pt speech rapid, pressured, disorganized. Pt intrusive, poor boundaries, argumentative. behavior not redirectable. This nurse notified Laura,NP of pt behavior. Special checks q 15 mins in place for safety, video monitoring in place. Will continue to monitor.

## 2017-04-24 NOTE — ED Notes (Signed)
Pt talking on hallway phone.  

## 2017-04-24 NOTE — ED Notes (Signed)
Pt threatening to [employer] and continues to [employer]

## 2017-04-24 NOTE — ED Notes (Signed)
Pt remains intrusive, poor boundries, yelling and screaming, argumentative with nursing staff. Grandiose, delusional, disorganized, loose associations. Pt behavior difficult to redirect. Dr. Rene Kocher made aware. Ativan  IM ordered.

## 2017-04-24 NOTE — ED Notes (Signed)
SBAR Report received from previous nurse. Pt received calm and visible on unit. Pt denies current SI/ HI, A/V H, depression, anxiety, or pain at this time, and appears otherwise stable and free of distress. 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 Will continue to assess.

## 2017-04-24 NOTE — ED Notes (Signed)
Pt woke demanding food and cheese and crackers provided. Pt woke labile and demanding, with rapid pressured speech. Pt stated the "Akentio better figure out some shit and get be out of here".

## 2017-04-24 NOTE — ED Notes (Signed)
Pt remains grandiose, delusional. Pt continues to report she owns the hospital. Pt continues to reference "prada" and "gucci" Pt bizarre. "We need to pack bags we are going to [place]." ; "Dr Bernadette Hoit is my daddy." Special checks q 15 mins in place for safety, Video monitoring in place. Will continue to monitor.

## 2017-04-24 NOTE — Progress Notes (Signed)
[facility] MD Progress Note  04/24/2017 12:20 PM Yvonne [name]  MRN:  [id number] Subjective:  Yvonne [name] remains pacing, agitated, intrusive with staff, speaking loudly and pacing the unit.  Discussing how she owns [employer] and other [employer] teams.  Staring intensely with staff.  Continues to require significant redirection and ongoing titration of AAP.  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 Manic behavior (HCC) [F30.10]   . Facial cellulitis [L03.211] 02/10/2014  . MDD (major depressive disorder) [F32.9] 08/02/2012   Total Time spent with patient: 15 minutes  Past Psychiatric History: See intake H&P for full details. Reviewed, with no updates at this time.   Past Medical History:  Past Medical History:  Diagnosis Date  . Bipolar 1 disorder (HCC)   . Medical history non-contributory   . Mental disorder   . No pertinent past medical history     Past Surgical History:  Procedure Laterality Date  . NO PAST SURGERIES     Family History:  Family History  Problem Relation Age of Onset  . Depression Sister   . Depression Brother   . Depression Sister   . CAD Mother   . Hypertension Father   . Diabetes Father   . Bipolar disorder Cousin    Family Psychiatric  History: See intake H&P for full details. Reviewed, with no updates at this time.  Social History:  History  Alcohol Use No     History  Drug Use No    Social History   Social History  . Marital status: Single    Spouse name: N/A  . Number of children: N/A  . Years of education: N/A   Social History Main Topics  . Smoking status: Never Smoker  . Smokeless tobacco: Never Used  . Alcohol use No  . Drug use: No  . Sexual activity: No   Other Topics Concern  . None   Social History Narrative  . None   Additional Social History:     Pain Medications: see Mar Prescriptions: see Mar Over the Counter: see Mar                    Sleep: Poor  Appetite:  Poor  Current Medications: Current Facility-Administered Medications  Medication Dose Route Frequency Provider Last Rate Last Dose  . acetaminophen (TYLENOL) tablet 650 mg  650 mg Oral Q4H PRN Maczis, Michael M, PA-C      . feeding supplement (ENSURE ENLIVE) (ENSURE ENLIVE) liquid 237 mL  237 mL Oral BID BM ,  Arya, MD   237 mL at 04/24/17 0942  . LORazepam (ATIVAN) injection 2 mg  2 mg Intramuscular Q8H PRN ,  Arya, MD      . OLANZapine (ZYPREXA) injection 5 mg  5 mg Intramuscular Q12H PRN ,  Arya, MD      . OLANZapine zydis (ZYPREXA) disintegrating tablet 10 mg  10 mg Oral BID ,  Arya, MD      . traZODone (DESYREL) tablet 50 mg  50 mg Oral QHS Lockwood, Robert, MD       Current Outpatient Prescriptions  Medication Sig Dispense Refill  . carbamazepine (TEGRETOL XR) 200 MG 12 hr tablet Take 1 tablet (200 mg total) by mouth 2 (two) times daily. For mood stabilization (Patient not taking: Reported on 04/22/2017) 60 tablet 0  .   hydrOXYzine (ATARAX/VISTARIL) 25 MG tablet Take 1 tablet (25 mg total) by mouth every 6 (six) hours as needed for anxiety. (Patient not taking: Reported on 04/22/2017) 60 tablet 0  . propranolol (INDERAL) 10 MG tablet Take 1 tablet (10 mg total) by mouth 2 (two) times daily. For anxiety (Patient not taking: Reported on 04/22/2017) 60 tablet 0  . risperiDONE (RISPERDAL) 1 MG tablet Take 1 tablet (1 mg total) by mouth 2 (two) times daily. (Patient not taking: Reported on 04/22/2017) 60 tablet 0  . traZODone (DESYREL) 50 MG tablet Take 1 tablet (50 mg total) by mouth at bedtime as needed for sleep. (Patient not taking: Reported on 04/22/2017) 30 tablet 0    Lab Results:  Results for orders placed or performed during the hospital encounter of 04/22/17 (from the past 48 hour(s))   Comprehensive metabolic panel     Status: Abnormal   Collection Time: 04/22/17  7:33 PM  Result Value Ref Range   Sodium 141 135 - 145 mmol/L   Potassium 4.5 3.5 - 5.1 mmol/L   Chloride 105 101 - 111 mmol/L   CO2 28 22 - 32 mmol/L   Glucose, Bld 87 65 - 99 mg/dL   BUN 29 (H) 6 - 20 mg/dL   Creatinine, Ser 0.67 0.44 - 1.00 mg/dL   Calcium 9.3 8.9 - 10.3 mg/dL   Total Protein 7.7 6.5 - 8.1 g/dL   Albumin 3.4 (L) 3.5 - 5.0 g/dL   AST 40 15 - 41 U/L   ALT 24 14 - 54 U/L   Alkaline Phosphatase 70 38 - 126 U/L   Total Bilirubin 0.5 0.3 - 1.2 mg/dL   GFR calc non Af Amer >60 >60 mL/min   GFR calc Af Amer >60 >60 mL/min    Comment: (NOTE) The eGFR has been calculated using the CKD EPI equation. This calculation has not been validated in all clinical situations. eGFR's persistently <60 mL/min signify possible Chronic Kidney Disease.    Anion gap 8 5 - 15  Ethanol     Status: None   Collection Time: 04/22/17  7:33 PM  Result Value Ref Range   Alcohol, Ethyl (B) <10 <10 mg/dL    Comment:        LOWEST DETECTABLE LIMIT FOR SERUM ALCOHOL IS 10 mg/dL FOR MEDICAL PURPOSES ONLY   CBC with Diff     Status: Abnormal   Collection Time: 04/22/17  7:33 PM  Result Value Ref Range   WBC 4.9 4.0 - 10.5 K/uL   RBC 3.60 (L) 3.87 - 5.11 MIL/uL   Hemoglobin 10.1 (L) 12.0 - 15.0 g/dL   HCT 30.8 (L) 36.0 - 46.0 %   MCV 85.6 78.0 - 100.0 fL   MCH 28.1 26.0 - 34.0 pg   MCHC 32.8 30.0 - 36.0 g/dL   RDW 13.9 11.5 - 15.5 %   Platelets 301 150 - 400 K/uL   Neutrophils Relative % 68 %   Lymphocytes Relative 20 %   Monocytes Relative 12 %   Eosinophils Relative 0 %   Basophils Relative 0 %   Neutro Abs 3.3 1.7 - 7.7 K/uL   Lymphs Abs 1.0 0.7 - 4.0 K/uL   Monocytes Absolute 0.6 0.1 - 1.0 K/uL   Eosinophils Absolute 0.0 0.0 - 0.7 K/uL   Basophils Absolute 0.0 0.0 - 0.1 K/uL   Smear Review MORPHOLOGY UNREMARKABLE   Acetaminophen level     Status: Abnormal   Collection Time: 04/22/17  7:33 PM     Result Value Ref Range   Acetaminophen (Tylenol), Serum <10 (L) 10 - 30 ug/mL    Comment:        THERAPEUTIC CONCENTRATIONS VARY SIGNIFICANTLY. A RANGE OF 10-30 ug/mL MAY BE AN EFFECTIVE CONCENTRATION FOR MANY PATIENTS. HOWEVER, SOME ARE BEST TREATED AT CONCENTRATIONS OUTSIDE THIS RANGE. ACETAMINOPHEN CONCENTRATIONS >150 ug/mL AT 4 HOURS AFTER INGESTION AND >50 ug/mL AT 12 HOURS AFTER INGESTION ARE OFTEN ASSOCIATED WITH TOXIC REACTIONS.   Salicylate level     Status: None   Collection Time: 04/22/17  7:33 PM  Result Value Ref Range   Salicylate Lvl <7.0 2.8 - 30.0 mg/dL  Urine rapid drug screen (hosp performed)     Status: None   Collection Time: 04/22/17  7:58 PM  Result Value Ref Range   Opiates NONE DETECTED NONE DETECTED   Cocaine NONE DETECTED NONE DETECTED   Benzodiazepines NONE DETECTED NONE DETECTED   Amphetamines NONE DETECTED NONE DETECTED   Tetrahydrocannabinol NONE DETECTED NONE DETECTED   Barbiturates NONE DETECTED NONE DETECTED    Comment:        DRUG SCREEN FOR MEDICAL PURPOSES ONLY.  IF CONFIRMATION IS NEEDED FOR ANY PURPOSE, NOTIFY LAB WITHIN 5 DAYS.        LOWEST DETECTABLE LIMITS FOR URINE DRUG SCREEN Drug Class       Cutoff (ng/mL) Amphetamine      1000 Barbiturate      200 Benzodiazepine   200 Tricyclics       300 Opiates          300 Cocaine          300 THC              50     Blood Alcohol level:  Lab Results  Component Value Date   ETH <10 04/22/2017   ETH <5 02/13/2017    Metabolic Disorder Labs: Lab Results  Component Value Date   HGBA1C 5.5 06/21/2012   MPG 111 06/21/2012   No results found for: PROLACTIN Lab Results  Component Value Date   CHOL 166 06/21/2012   TRIG 58 06/21/2012   HDL 58 06/21/2012   CHOLHDL 2.9 06/21/2012   VLDL 12 06/21/2012   LDLCALC 96 06/21/2012    Physical Findings: AIMS:  , ,  ,  ,    CIWA:    COWS:     Musculoskeletal: Strength & Muscle Tone: within normal limits Gait & Station:  normal Patient leans: N/A  Psychiatric Specialty Exam: Physical Exam  Review of Systems  Unable to perform ROS: Psychiatric disorder    Blood pressure 112/84, pulse (!) 128, temperature 98 F (36.7 C), temperature source Oral, resp. rate 18, height 5' 3" (1.6 m), weight 38.6 kg (85 lb), SpO2 99 %.Body mass index is 15.06 kg/m.  General Appearance: Bizarre and Disheveled  Eye Contact:  Fair  Speech:  Garbled and Pressured  Volume:  Increased  Mood:  Euphoric  Affect:  Labile  Thought Process:  Irrelevant and Descriptions of Associations: Loose  Orientation:  Full (Time, Place, and Person)  Thought Content:  Illogical, Delusions and Paranoid Ideation  Suicidal Thoughts:  unable to assess  Homicidal Thoughts:  unable to assess  Memory:  Immediate;   Poor  Judgement:  Poor  Insight:  Lacking  Psychomotor Activity:  Restlessness  Concentration:  Concentration: Poor  Recall:  Poor  Fund of Knowledge:  Poor  Language:  Poor  Akathisia:  NA  Handed:  Right  AIMS (if indicated):       Assets:  Communication Skills  ADL's:  Intact  Cognition:  WNL  Sleep:   poor     Treatment Plan Summary: Yvonne [name] is a 51 year old female with acute psychosis, history of bipolar disorder. Requiring substantial medication management and will require psychiatric hospitalization.   Zyprexa increased to 10 mg BID Zyprexa 10 mg IM given once this AM Lorazepam 2 mg IM q8H prn agitation - MUST wait for AT LEAST 60 minutes after zyprexa for safe administration Zyprexa 5 mg IM q12hprn agitation   Aundra Dubin, MD 04/24/2017, 12:20 PM

## 2017-04-24 NOTE — ED Notes (Signed)
Pt came back out of room yelling and pointing at writer stating that she has been here since August 23 and she don't even belong here. Not letting writer respond to writer's accusations and demands loosing rationality

## 2017-04-25 ENCOUNTER — Encounter (HOSPITAL_COMMUNITY): Payer: Self-pay | Admitting: Emergency Medicine

## 2017-04-25 MED ORDER — CARBAMAZEPINE 200 MG PO TABS
200.0000 mg | ORAL_TABLET | Freq: Two times a day (BID) | ORAL | Status: DC
  Administered 2017-04-25 – 2017-05-01 (×8): 200 mg via ORAL
  Filled 2017-04-25 (×10): qty 1

## 2017-04-25 MED ORDER — LORAZEPAM 1 MG PO TABS
2.0000 mg | ORAL_TABLET | Freq: Three times a day (TID) | ORAL | Status: DC | PRN
  Administered 2017-04-25 – 2017-04-29 (×3): 2 mg via ORAL
  Filled 2017-04-25 (×3): qty 2

## 2017-04-25 NOTE — BH Assessment (Signed)
[facility] Assessment Progress Note This Clinical research associate spoke with patient this date to discuss treatment progress and evaluate mental status. Patient continues to display bizarre behaviors and refuses to interact with this Clinical research associate. Patient continues to meet inpatient criteria.

## 2017-04-25 NOTE — Progress Notes (Signed)
04/25/17 1354:  Pt was unable to focus on one topic.  Pt stated it was Thanksgiving.  Pt was also going through a some magazines stating she was going to [employer] and [employer].  Pt also stated [employer] was her clothing line.  Pt stated she needed schools supplies, adidas, [employer] Secrets lotion, [employer] and [employer] and some things from [place].  Pt also stated Rolm Bookbinder is her twin and that her bed was a Surveyor, mining car.  Pt was all over the place and could not concentrate on one thing.   Caroll Rancher, LRT/CTRS

## 2017-04-25 NOTE — BH Assessment (Signed)
[facility] Assessment Progress Note  Per Thedore Mins, MD, this pt requires psychiatric hospitalization at this time.  Pt presents under IVC initiated by a court liaison, and upheld by Ardeth Sportsman, MD.  The following facilities have been contacted to seek placement for this pt, with results as noted:  Beds available, information sent, decision pending:  [facility] [place]   At capacity:  [facility] [place], [place] Triage Specialist [phone]

## 2017-04-25 NOTE — ED Notes (Signed)
Patient denies SI/HI/AVH at this however continues to be grandiose in her thought pattern. Encouragement and support provided and safety maintain. Q 15 min safety checks remain in place and video monitoring.

## 2017-04-25 NOTE — BH Assessment (Signed)
Writer forwarded patient's information to [facility] Attending Physician (Dr. Jennet Maduro) and [facility] BMU Charge Nurse Maryelizabeth Kaufmann). Due to patient's acuity and acuity of the unit, at this time unable manage patient's symptoms safely.

## 2017-04-25 NOTE — ED Notes (Signed)
Pt grandiose, delusional. "I have 90 million dollars in the bank." Pt continues to reference "Gucci" and "Prada", "Silver and gold" Pt speech pressured, rapid, tangential, disorganized. Encouragement and support provided. Special checks q 15 mins in place for safety, Video monitoring in place. Will continue to monitor.

## 2017-04-25 NOTE — ED Notes (Signed)
Pt becoming increasingly agitated, speech pressured, demanding her "28 thousand dollar prada bag." Will continue to monitor.

## 2017-04-26 DIAGNOSIS — Z560 Unemployment, unspecified: Secondary | ICD-10-CM

## 2017-04-26 DIAGNOSIS — G4700 Insomnia, unspecified: Secondary | ICD-10-CM

## 2017-04-26 DIAGNOSIS — F22 Delusional disorders: Secondary | ICD-10-CM

## 2017-04-26 DIAGNOSIS — R4586 Emotional lability: Secondary | ICD-10-CM

## 2017-04-26 DIAGNOSIS — F3010 Manic episode without psychotic symptoms, unspecified: Secondary | ICD-10-CM

## 2017-04-26 DIAGNOSIS — R4587 Impulsiveness: Secondary | ICD-10-CM

## 2017-04-26 NOTE — Consult Note (Signed)
[facility] ED Progress Note  04/26/2017 11:00 AM Yvonne [name]  MRN:  [id number] Subjective: '' I don't care what people says, Dr. Mervyn Skeeters you are my father and I want you to buy me a 60 bedroom house in [place], [place].'' Objective: Patient is seen, interviewed, her chart reviewed and her case discussed with the treatment team. Patient has been brought to the ED for 4 days with no significant improvement. She remains grandiose, delusional, psychotic, defiant, argumentative and oppositional. Patient has disorganized thought process, with pressure speech, gets easily agitated, intrusive with staff, speaking loudly and pacing the unit.  She claims to have billions of dollars in the bank, owns the [employer] and [employer] but patient is currently unemployed. She talks to herself as if responding to internal stimuli and often difficult to re-direct.  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 Manic behavior (HCC) [F30.10]   . Facial cellulitis [L03.211] 02/10/2014  . MDD (major depressive disorder) [F32.9] 08/02/2012   Total Time spent with patient: 30 minutes  Past Psychiatric History: as above   Past Medical History:  Past Medical History:  Diagnosis Date  . Bipolar 1 disorder (HCC)   . Medical history non-contributory   . Mental disorder   . No pertinent past medical history     Past Surgical History:  Procedure Laterality Date  . NO PAST SURGERIES     Family History:  Family History  Problem Relation Age of Onset  . Depression Sister   . Depression Brother   . Depression Sister   . CAD Mother   . Hypertension Father   . Diabetes Father   . Bipolar disorder Cousin    Family Psychiatric  History:  Social History:  History  Alcohol Use No     History  Drug Use No    Social History   Social  History  . Marital status: Single    Spouse name: N/A  . Number of children: N/A  . Years of education: N/A   Social History Main Topics  . Smoking status: Never Smoker  . Smokeless tobacco: Never Used  . Alcohol use No  . Drug use: No  . Sexual activity: No   Other Topics Concern  . None   Social History Narrative  . None    Sleep: Poor  Appetite:  Fair  Current Medications: Current Facility-Administered Medications  Medication Dose Route Frequency Provider Last Rate Last Dose  . acetaminophen (TYLENOL) tablet 650 mg  650 mg Oral Q4H PRN Maczis, Elmer Sow, PA-C      . carbamazepine (TEGRETOL) tablet 200 mg  200 mg Oral BID PC Josslynn Mentzer, MD   200 mg at 04/26/17 0831  . feeding supplement (ENSURE ENLIVE) (ENSURE ENLIVE) liquid 237 mL  237 mL Oral BID BM Burnard Leigh, MD   237 mL at 04/26/17 0931  . LORazepam (ATIVAN) injection 2 mg  2 mg Intramuscular Q8H PRN Burnard Leigh, MD      . LORazepam (ATIVAN) tablet 2 mg  2 mg Oral Q8H PRN Laveda Abbe, NP   2 mg at 04/25/17 1444  . OLANZapine (ZYPREXA) injection 5 mg  5 mg Intramuscular Q12H PRN Burnard Leigh, MD      . OLANZapine zydis [facility]) disintegrating tablet 10 mg  10 mg Oral BID Angus Palms  Marcos Eke, MD   10 mg at 04/26/17 0930  . traZODone (DESYREL) tablet 50 mg  50 mg Oral QHS Gerhard Munch, MD   50 mg at 04/24/17 2109   Current Outpatient Prescriptions  Medication Sig Dispense Refill  . carbamazepine (TEGRETOL XR) 200 MG 12 hr tablet Take 1 tablet (200 mg total) by mouth 2 (two) times daily. For mood stabilization (Patient not taking: Reported on 04/22/2017) 60 tablet 0  . hydrOXYzine (ATARAX/VISTARIL) 25 MG tablet Take 1 tablet (25 mg total) by mouth every 6 (six) hours as needed for anxiety. (Patient not taking: Reported on 04/22/2017) 60 tablet 0  . propranolol (INDERAL) 10 MG tablet Take 1 tablet (10 mg total) by mouth 2 (two) times daily. For anxiety (Patient not taking:  Reported on 04/22/2017) 60 tablet 0  . risperiDONE (RISPERDAL) 1 MG tablet Take 1 tablet (1 mg total) by mouth 2 (two) times daily. (Patient not taking: Reported on 04/22/2017) 60 tablet 0  . traZODone (DESYREL) 50 MG tablet Take 1 tablet (50 mg total) by mouth at bedtime as needed for sleep. (Patient not taking: Reported on 04/22/2017) 30 tablet 0    Lab Results: No results found for this or any previous visit (from the past 48 hour(s)).  Blood Alcohol level:  Lab Results  Component Value Date   ETH <10 04/22/2017   ETH <5 02/13/2017    Physical Findings: AIMS:  , ,  ,  ,    CIWA:    COWS:     Musculoskeletal: Strength & Muscle Tone: within normal limits Gait & Station: normal Patient leans: N/A  Psychiatric Specialty Exam: Physical Exam  Psychiatric: Her affect is labile. Her speech is rapid and/or pressured. She is agitated, aggressive and hyperactive. Thought content is paranoid and delusional. Cognition and memory are normal. She expresses impulsivity. She is inattentive.    Review of Systems  Constitutional: Positive for weight loss.  HENT: Negative.   Eyes: Negative.   Respiratory: Negative.   Cardiovascular: Negative.   Gastrointestinal: Negative.   Genitourinary: Negative.   Musculoskeletal: Negative.   Skin: Negative.   Neurological: Negative.   Endo/Heme/Allergies: Negative.   Psychiatric/Behavioral: Positive for hallucinations. The patient has insomnia.     Blood pressure 100/70, pulse (!) 102, temperature 99 F (37.2 C), temperature source Oral, resp. rate 20, height  (1.6 m), weight 38.6 kg (85 lb), SpO2 100 %.Body mass index is 15.06 kg/m.  General Appearance: Bizarre  Eye Contact:  Good  Speech:  Pressured  Volume:  Increased  Mood:  Irritable  Affect:  Labile  Thought Process:  Disorganized  Orientation:  Full (Time, Place, and Person)  Thought Content:  Illogical, Delusions and Hallucinations: Auditory  Suicidal Thoughts:  No  Homicidal  Thoughts:  No  Memory:  Immediate;   Fair Recent;   Fair Remote;   Fair  Judgement:  Impaired  Insight:  Lacking  Psychomotor Activity:  Increased and Restlessness  Concentration:  Concentration: Poor and Attention Span: Poor  Recall:  [employer] of Knowledge:  Fair  Language:  Good  Akathisia:  No  Handed:  Right  AIMS (if indicated):     Assets:  Manufacturing systems engineer Social Support  ADL's:  Intact  Cognition:  WNL  Sleep:   poor      Treatment Plan Summary: Daily contact with patient to assess and evaluate symptoms and progress in treatment and Medication management Zyprexa increased to 10 mg BID Lorazepam 2 mg IM q8H prn agitation -  MUST wait for AT LEAST 60 minutes after zyprexa for safe administration Zyprexa 5 mg IM q12hprn agitation  Disposition: Patient needs referral to [facility] due to level of her acuity.  Thedore Mins, MD 04/26/2017, 11:00 AM

## 2017-04-26 NOTE — ED Notes (Signed)
Pt sleeping during vital signs. RN notified. Pt was not woken up and vitals will be taken when she does.

## 2017-04-26 NOTE — Progress Notes (Signed)
CSW contacted [facility] and spoke to [name], Admission Coordinator.  Per [name], [facility] has been very busy today and they have not yet reviewed new referrals.  Disposition will continue to follow for placement.  Yvonne [name]. Kaylyn Lim, MSW, LCSWA Disposition Clinical Social Work [phone] (cell) [phone] (office)

## 2017-04-26 NOTE — BH Assessment (Addendum)
[facility] Assessment Progress Note  Per Thedore Mins, MD, this pt continues to require psychiatric hospitalization.  Pt presents under IVC initiated by a court liaison, and upheld by Ardeth Sportsman, MD.  At 09:56 this writer spoke to [place] at the [facility] and obtained authorization for [facility] referral, authorization [id number] from 04/26/2017 - 05/02/2017.  Please note that authorization does not mean that pt has been accepted to the facility.  At 11:43 I called [facility] and spoke to [facility], who accepted demographic information by telephone.  As of this writing, referral information is being faxed to [facility]   Doylene Canning, MA Triage Specialist [phone]   Addendum:  At 12:16 Coralee North at [facility] confirms receipt of referral information.  She reports that their nurse is currently reviewing referral.  Final decision is pending.  Doylene Canning, MA Triage Specialist [phone]   Addendum:  Pt has also been referred to the following facilities, with results as noted:  Beds available, information sent, decision pending:  [place]   At capacity:  [facility], [place] Triage Specialist [phone]

## 2017-04-26 NOTE — Progress Notes (Signed)
04/26/17 1403:  LRT spoke with pt.  Pt wanted LRT to stay with her in her room and do activities.  LRT informed pt there were other patients that wanted to do activities and invited pt to dayroom.  Pt declined.   Caroll Rancher, LRT/CTRS

## 2017-04-26 NOTE — ED Notes (Signed)
Pt has been compliant with medication regimen. Pt remains hyperactive, restless, manic. Pt speech rapid, pressured, tangential. Pt grandiose, delusional. When psychiatry team made morning rounds, pt referring to psychiatrist as "My daddy" Pt also asking psychiatrist to buy her a house. Pt intrusive at times. Encouragement and support provided. Special checks q 15 mins in place for safety, Video monitoring in place. Will continue to monitor.

## 2017-04-26 NOTE — ED Notes (Signed)
Patient continues to refuse her night time medication. Patient states "I'm not suppose to take medication and I'll tell [name] that in the morning because I ain't scared I'm a grown ass women." Encouragement and support provided and safety maintain. Q 15 min safety checks in place and video monitoring.

## 2017-04-26 NOTE — ED Notes (Signed)
Patient offered pm medication again and patient continues to refuse medication. Patient states "that I don't need medication because I am only here because my daddy died in 2023/05/05 and I passed out under a big oak tree. I have been in here since August  I need to go home because I work for [employer]". Encouragement and support provided and safety maintain. Q 15 min safety checks remain in place and video monitoring.

## 2017-04-27 NOTE — ED Notes (Signed)
Recheck pt pulse, Educated her on the reasoning of sitting quietly while taking vitals noted "talking and movement can give false readings." Pt cooperated, Pulse 98.

## 2017-04-27 NOTE — B[facility] Counselor (Signed)
Heidi from [facility], called and reported they will have discharges today and the pt will be placed on their waiting list.    Redmond Pullingreylese D Burhan Barham, MS, [facility], [facility] Triage Specialist [phone]

## 2017-04-27 NOTE — BH Assessment (Signed)
[facility] Assessment Progress Note  At 13:46 [name] at [facility] pt is now on their wait list.  She is not a priority patient at this time.  Doylene Canninghomas Duey Liller, MA Triage Specialist [phone]

## 2017-04-27 NOTE — Progress Notes (Signed)
04/27/17 1510:  Pt came in dayroom and was talking about how she makes $125,000 a day.  Pt was also talking about being related to [place] and Donn PieriniMichael Jackson.  Pt was delusional, unable to focus and rambles.  Pt was pleasant.   Caroll RancherMarjette Aneisa Karren, LRT/CTRS

## 2017-04-27 NOTE — ED Notes (Signed)
Pt continues to refuses medication :"I have not taken medication, since 2015. Dr. Mervyn SkeetersA is my daddy, now and he knows that I have not taken medication since then. He has been my daddy, since my daddy died."

## 2017-04-27 NOTE — ED Notes (Signed)
Patient continues to be agitated yelling at staff that it's time for her breakfast. Writer attempted to orient patient to time of night. Writer offered patient a snack but patient refused and stated "you playing with my food and I don't play that. I don't play with your food so don't play with mine. Now bring me my breakfast. Encouragement and support provided and safety maintain. Q 15 min safety checks remain in place and video monitoring.

## 2017-04-27 NOTE — ED Notes (Signed)
Pt continue to refuses am medication. The approach was made by Clinical research associatewriter and another colleague. Staff will continue to attempt medication administration.

## 2017-04-27 NOTE — ED Notes (Signed)
Patient denies pain and is resting comfortably.  

## 2017-04-27 NOTE — ED Notes (Signed)
Pt refusing medications. Will attempt at later time.

## 2017-04-27 NOTE — ED Notes (Signed)
Patient getting agitated at this time and pushing code button. Writer tried to educated patient on the use of code blue call bell. Patient stating " I know how to use bells in my office and you ain't gone tell me I can't ring bells in my own office". Will continue to monitor patient patient for increase agitation. Encouragement and support provided and safety maintain. Q 15 min safety checks remain in place and video monitoring.

## 2017-04-28 NOTE — ED Notes (Signed)
Pt refused medication on first attempt. Writer ask colleague to initiate 2nd attempt, 2nd attempt was successful. Staff will continue to meet needs and monitor

## 2017-04-28 NOTE — ED Notes (Signed)
Patient continues to be paranoid, loud and tangential. Requesting to get her breakfast stated that the dinner served is for her husband. Asking this Clinical research associatewriter and other staff who came to her room and "stole her comforter". Requesting that this writer brings to her "my 3 Gucci purses with $28,000 in each and gold wrist watches". Patient not compliant with her treatment/medication plans. Stated that Dr A. knows that I don't take medicine. Refuses med education offered. Denies pain, SI/HI, AH/VH at this time stated "I am not sick. Everybody knows that" Patient in bed appear to be sleeping at this time. Will continue to monitor patient.

## 2017-04-28 NOTE — BH Assessment (Signed)
[facility] Assessment Progress Note  At 09:04 [name] on their [facility].  At 09:52 [name] reports that pt remains on their list of pts "to be reviewed."  [name], MA Triage Specialist [phone]

## 2017-04-28 NOTE — ED Notes (Signed)
Patient is agitated and demanding that I go get right now. Patient keeps coming to desk ranting and raging about breakfast and pointing finger at this Clinical research associatewriter. Patient is hard to redirect at this time. Encouragement and support provided and safety maintain. Q 15 min safety checks remain in place and video monitoring.

## 2017-04-28 NOTE — Consult Note (Signed)
[facility] Psych ED Progress Note  04/28/2017 11:21 AM Yvonne [name]  MRN:  [id number] Subjective: '' I have a baby I did not tell you about and I hope you are looking to by me a  60 bedroom house in [place], [place].'' Objective: Patient was seen, interviewed, her chart reviewed and her case discussed with the treatment team. Patient  remains disorganized, grandiose, delusional, psychotic, argumentative, fidgety, with pressure speech, racing and intrusive thought. She talks to herself as if responding to internal stimuli and often difficult to re-direct.  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 Manic behavior (HCC) [F30.10]   . Facial cellulitis [L03.211] 02/10/2014  . MDD (major depressive disorder) [F32.9] 08/02/2012   Total Time spent with patient: 30 minutes  Past Psychiatric History: as above   Past Medical History:  Past Medical History:  Diagnosis Date  . Bipolar 1 disorder (HCC)   . Medical history non-contributory   . Mental disorder   . No pertinent past medical history     Past Surgical History:  Procedure Laterality Date  . NO PAST SURGERIES     Family History:  Family History  Problem Relation Age of Onset  . Depression Sister   . Depression Brother   . Depression Sister   . CAD Mother   . Hypertension Father   . Diabetes Father   . Bipolar disorder Cousin    Family Psychiatric  History:  Social History:  History  Alcohol Use No     History  Drug Use No    Social History   Social History  . Marital status: Single    Spouse name: N/A  . Number of children: N/A  . Years of education: N/A   Social History Main Topics  . Smoking status: Never Smoker  . Smokeless tobacco: Never Used  . Alcohol use No  . Drug use: No  . Sexual activity: No   Other Topics Concern  . None    Social History Narrative  . None    Sleep: Poor  Appetite:  Fair  Current Medications: Current Facility-Administered Medications  Medication Dose Route Frequency Provider Last Rate Last Dose  . acetaminophen (TYLENOL) tablet 650 mg  650 mg Oral Q4H PRN Maczis, Elmer Sow, PA-C      . carbamazepine (TEGRETOL) tablet 200 mg  200 mg Oral BID PC Caylah Plouff, MD   200 mg at 04/28/17 0930  . feeding supplement (ENSURE ENLIVE) (ENSURE ENLIVE) liquid 237 mL  237 mL Oral BID BM Burnard Leigh, MD   237 mL at 04/28/17 0930  . LORazepam (ATIVAN) injection 2 mg  2 mg Intramuscular Q8H PRN Burnard Leigh, MD      . LORazepam (ATIVAN) tablet 2 mg  2 mg Oral Q8H PRN Laveda Abbe, NP   2 mg at 04/28/17 0930  . OLANZapine (ZYPREXA) injection 5 mg  5 mg Intramuscular Q12H PRN Burnard Leigh, MD      . OLANZapine zydis [facility]) disintegrating tablet 10 mg  10 mg Oral BID Burnard Leigh, MD   10 mg at 04/28/17 0931  . traZODone (DESYREL) tablet 50 mg  50 mg Oral QHS Gerhard Munch, MD   50 mg at 04/24/17 2109   Current Outpatient Prescriptions  Medication Sig Dispense Refill  . carbamazepine (TEGRETOL XR) 200 MG 12 hr  tablet Take 1 tablet (200 mg total) by mouth 2 (two) times daily. For mood stabilization (Patient not taking: Reported on 04/22/2017) 60 tablet 0  . hydrOXYzine (ATARAX/VISTARIL) 25 MG tablet Take 1 tablet (25 mg total) by mouth every 6 (six) hours as needed for anxiety. (Patient not taking: Reported on 04/22/2017) 60 tablet 0  . propranolol (INDERAL) 10 MG tablet Take 1 tablet (10 mg total) by mouth 2 (two) times daily. For anxiety (Patient not taking: Reported on 04/22/2017) 60 tablet 0  . risperiDONE (RISPERDAL) 1 MG tablet Take 1 tablet (1 mg total) by mouth 2 (two) times daily. (Patient not taking: Reported on 04/22/2017) 60 tablet 0  . traZODone (DESYREL) 50 MG tablet Take 1 tablet (50 mg total) by mouth at bedtime as needed for sleep.  (Patient not taking: Reported on 04/22/2017) 30 tablet 0    Lab Results: No results found for this or any previous visit (from the past 48 hour(s)).  Blood Alcohol level:  Lab Results  Component Value Date   ETH <10 04/22/2017   ETH <5 02/13/2017    Physical Findings: AIMS:  , ,  ,  ,    CIWA:    COWS:     Musculoskeletal: Strength & Muscle Tone: within normal limits Gait & Station: normal Patient leans: N/A  Psychiatric Specialty Exam: Physical Exam  Psychiatric: Her affect is labile. Her speech is rapid and/or pressured. She is agitated, aggressive and hyperactive. Thought content is paranoid and delusional. Cognition and memory are normal. She expresses impulsivity. She is inattentive.    Review of Systems  Constitutional: Positive for weight loss.  HENT: Negative.   Eyes: Negative.   Respiratory: Negative.   Cardiovascular: Negative.   Gastrointestinal: Negative.   Genitourinary: Negative.   Musculoskeletal: Negative.   Skin: Negative.   Neurological: Negative.   Endo/Heme/Allergies: Negative.   Psychiatric/Behavioral: Positive for hallucinations. The patient has insomnia.     Blood pressure 127/65, pulse 99, temperature 98 F (36.7 C), temperature source Oral, resp. rate 16, height 5\' 3"  (1.6 m), weight 38.6 kg (85 lb), SpO2 100 %.Body mass index is 15.06 kg/m.  General Appearance: Bizarre  Eye Contact:  Good  Speech:  Pressured  Volume:  Increased  Mood:  Irritable  Affect:  Labile  Thought Process:  Disorganized  Orientation:  Full (Time, Place, and Person)  Thought Content:  Illogical, Delusions and Hallucinations: Auditory  Suicidal Thoughts:  No  Homicidal Thoughts:  No  Memory:  Immediate;   Fair Recent;   Fair Remote;   Fair  Judgement:  Impaired  Insight:  Lacking  Psychomotor Activity:  Increased and Restlessness  Concentration:  Concentration: Poor and Attention Span: Poor  Recall:  [employer]  Fund of Knowledge:  Fair  Language:  Good   Akathisia:  No  Handed:  Right  AIMS (if indicated):     Assets:  Manufacturing systems engineerCommunication Skills Social Support  ADL's:  Intact  Cognition:  WNL  Sleep:   poor      Treatment Plan Summary: Daily contact with patient to assess and evaluate symptoms and progress in treatment and Medication management Continue Zyprexa  10 mg BID and Carbamazepine 200 mg bid for bipolar.  Dis[facility]: [facility] due to level of her acuity.  Thedore MinsAkintayo, Beverly Ferner, MD 04/28/2017, 11:21 AM

## 2017-04-28 NOTE — ED Notes (Signed)
Prn administered r/t anxiety, increasing manic behavior

## 2017-04-28 NOTE — Progress Notes (Signed)
04/28/17 1404:  LRT went to pt room to offer activities.  Pt was sitting in bed eating lunch and watching television.  LRT sat and talked with pt.  Pt stated she was pregnant but [place]  Pt was still focused on getting Pr[employer] and Gucci clothes from the store.  Pt stated Betsey AmenBatman was her son.  Pt also stated her room was her house.   Caroll RancherMarjette Lataja Newland, LRT/CTRS

## 2017-04-28 NOTE — BH Assessment (Addendum)
[facility] Assessment Progress Note  Per Thedore MinsMojeed Akintayo, MD, this pt continues to require psychiatric hospitalization at this time.  The following facilities have been contacted to seek placement for this pt, with results as noted:  Beds available, information sent, decision pending:  [facility]   At capacity:  [facility]   [place], [place] Triage Specialist [phone]

## 2017-04-29 NOTE — BH Assessment (Signed)
[facility] Assessment Progress Note  At 09:41 V[facility] KotykJay at [facility] reports [facility] pt remains on their wait list.  Doylene Canninghomas Khristen Cheyney, MA Triage Specialist [phone]

## 2017-04-29 NOTE — ED Notes (Signed)
SBAR Report received from previous nurse. Pt received calm and asleep on unit. Pt asleep but appears otherwise stable and free of distress. Pt reminded of camera surveillance, q 15 min rounds, and rules of the milieu. Will continue to assess.

## 2017-04-29 NOTE — ED Notes (Signed)
Pt behavior remains bizarre, delusional, grandiose. Pt asking nursing staff to order her a laborghini car.  Special checks q 15 mins in place for safety, Video monitoring in place. Will continue to monitor.

## 2017-04-29 NOTE — BH Assessment (Addendum)
[facility] Assessment Progress Note This Clinical research associatewriter spoke with patient this date to evaluate treatment progress and discuss treatment planning. Patient continues to be delusional and very grandiose presenting with flight of ideas and is difficult to redirect. Patient's speech is pressured, tangential and is hyper-verbal speaking in a loud voice as she interact with this Clinical research associatewriter. Patient denies any S/I, H/I or AVH but when asked if patient was oriented to time/place, patient replied "I make and control the time." This writer attempted several times to redirect patient and focus on treatment concerns unsuccessfully. Patient denies she has any "mental illness" and continues to be grandiose being fixated on "all her millions." Patient does not seem to be responding to internal stimuli and is observed to be less agitated this date. Per note review, patient continues to be non-compliant with her medication regimen. Patient is also on the waiting list for [facility]. IVC to be renewed this date. Akintayo MD recommends continued inpatient monitoring as appropriate bed placement is investigated.

## 2017-04-29 NOTE — ED Notes (Signed)
Pt remains restless, hyperactive. Pt speech pressured, tangential rapid. Pt grandiose, delusional. "[place]  Pt behavior difficult to redirect. "I own the whole world." Encouragement and support provided. Special checks q 15 mins in place for safety. Video monitoring in place. Will continue to monitor.

## 2017-04-29 NOTE — Progress Notes (Signed)
04/29/17 1356:  LRT went to pt room for activities, pt was sleep.   Caroll RancherMarjette Reegan Bouffard, LRT/CTRS

## 2017-04-30 NOTE — ED Notes (Signed)
Dr Elsie SaasJonnalagadda and Catha NottinghamJamison DNP into see.  Pt states that "dr A is my real father..."  Pt also states that she is missing 3 bags and $28000.00.  Pt reports that she lives alone and works for [employer] and that the [place] are ready to come down here.

## 2017-04-30 NOTE — ED Notes (Signed)
Up in hall talking w/ other pt 

## 2017-04-30 NOTE — ED Notes (Addendum)
On the phone, snack given

## 2017-04-30 NOTE — ED Notes (Signed)
On the phone 

## 2017-04-30 NOTE — ED Notes (Signed)
SBAR Report received from previous nurse. Pt received calm and visible on unit. Pt denies current SI/ HI, A/V H, depression, anxiety, or pain at this time, and appears otherwise stable and free of distress. Pt reminded of camera surveillance, q 15 min rounds, and rules of the milieu. Will continue to assess. 

## 2017-04-30 NOTE — Progress Notes (Signed)
LCSW following for disposition of needs: inpatient psychiatric admission.  [facility] She remains on the wait list for next available bed.  Deretha EmoryHannah Danaya Geddis LCSW, MSW Clinical Social Work: Optician, dispensingystem Wide Float Coverage for :  [phone]

## 2017-04-30 NOTE — ED Notes (Signed)
Up to the desk reports that she has 3 pocket books here that are worth $28000.00 and that she has ordered clothes from numerous stores and they have noe been delivered yet.  Pt states that this is her "ome" and they will be delivered here.

## 2017-04-30 NOTE — ED Notes (Signed)
Up in the hall talking w/ other pt 

## 2017-04-30 NOTE — ED Notes (Signed)
Up to the desk asking to have "baby toys and clothes shipped here over night.Marland Kitchen."

## 2017-04-30 NOTE — ED Notes (Signed)
Pt woke and asked for dinner trey with fixed delusion that she has been here since August 23. Pt states that she is pregnant and that Dr. Ivin BootyAkentio knows that she does not need the medication. Pt took the PO scheduled medication ate cheese, crackers, and Mal[place]key sandwich and went back to bed.

## 2017-04-30 NOTE — ED Notes (Signed)
sitting in bed, nad, pt refused to drink the supplement.

## 2017-04-30 NOTE — Consult Note (Signed)
[facility] Psych ED Progress Note  04/30/2017 1:34 PM Yvonne [name]  MRN:  [id number]   Subjective: '' I have a baby and [name] is my daddy.  I have 3 purses:  Orvan Seen, and [employer].''  Objective: 51 yo female who presented to the Ed with mania.  Pleasant but continues to have flight of ideas and delusions with increase in energy, redirection needed at times due to her distractibility.  She is taking care of her ADLs.    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 Manic behavior (HCC) [F30.10]   . Facial cellulitis [L03.211] 02/10/2014   Total Time spent with patient: 30 minutes  Past Psychiatric History: as above   Past Medical History:  Past Medical History:  Diagnosis Date  . Bipolar 1 disorder (HCC)   . Medical history non-contributory   . Mental disorder   . No pertinent past medical history     Past Surgical History:  Procedure Laterality Date  . NO PAST SURGERIES     Family History:  Family History  Problem Relation Age of Onset  . Depression Sister   . Depression Brother   . Depression Sister   . CAD Mother   . Hypertension Father   . Diabetes Father   . Bipolar disorder Cousin    Family Psychiatric  History:  Social History:  History  Alcohol Use No     History  Drug Use No    Social History   Social History  . Marital status: Single    Spouse name: N/A  . Number of children: N/A  . Years of education: N/A   Social History Main Topics  . Smoking status: Never Smoker  . Smokeless tobacco: Never Used  . Alcohol use No  . Drug use: No  . Sexual activity: No   Other Topics Concern  . None   Social History Narrative  . None    Sleep: Poor  Appetite:  Fair  Current Medications: Current Facility-Administered Medications  Medication Dose Route Frequency Provider Last Rate  Last Dose  . acetaminophen (TYLENOL) tablet 650 mg  650 mg Oral Q4H PRN Maczis, Elmer Sow, PA-C      . carbamazepine (TEGRETOL) tablet 200 mg  200 mg Oral BID PC Akintayo, Mojeed, MD   200 mg at 04/29/17 1725  . feeding supplement (ENSURE ENLIVE) (ENSURE ENLIVE) liquid 237 mL  237 mL Oral BID BM Burnard Leigh, MD   237 mL at 04/29/17 1433  . LORazepam (ATIVAN) injection 2 mg  2 mg Intramuscular Q8H PRN Burnard Leigh, MD      . LORazepam (ATIVAN) tablet 2 mg  2 mg Oral Q8H PRN Laveda Abbe, NP   2 mg at 04/29/17 8657  . OLANZapine (ZYPREXA) injection 5 mg  5 mg Intramuscular Q12H PRN Burnard Leigh, MD      . OLANZapine zydis [facility]) disintegrating tablet 10 mg  10 mg Oral BID Burnard Leigh, MD   10 mg at 04/29/17 2222  . traZODone (DESYREL) tablet 50 mg  50 mg Oral QHS Gerhard Munch, MD   50 mg at 04/29/17 2222   Current Outpatient Prescriptions  Medication Sig Dispense Refill  . carbamazepine (TEGRETOL XR) 200 MG 12 hr tablet Take 1 tablet (200 mg total) by mouth 2 (two) times daily. For mood stabilization (  Patient not taking: Reported on 04/22/2017) 60 tablet 0  . hydrOXYzine (ATARAX/VISTARIL) 25 MG tablet Take 1 tablet (25 mg total) by mouth every 6 (six) hours as needed for anxiety. (Patient not taking: Reported on 04/22/2017) 60 tablet 0  . propranolol (INDERAL) 10 MG tablet Take 1 tablet (10 mg total) by mouth 2 (two) times daily. For anxiety (Patient not taking: Reported on 04/22/2017) 60 tablet 0  . risperiDONE (RISPERDAL) 1 MG tablet Take 1 tablet (1 mg total) by mouth 2 (two) times daily. (Patient not taking: Reported on 04/22/2017) 60 tablet 0  . traZODone (DESYREL) 50 MG tablet Take 1 tablet (50 mg total) by mouth at bedtime as needed for sleep. (Patient not taking: Reported on 04/22/2017) 30 tablet 0    Lab Results: No results found for this or any previous visit (from the past 48 hour(s)).  Blood Alcohol level:  Lab Results  Component  Value Date   ETH <10 04/22/2017   ETH <5 02/13/2017    Physical Findings: AIMS:  , ,  ,  ,    CIWA:    COWS:     Musculoskeletal: Strength & Muscle Tone: within normal limits Gait & Station: normal Patient leans: N/A  Psychiatric Specialty Exam: Physical Exam  Constitutional: She is oriented to person, place, and time. She appears well-developed.  HENT:  Head: Normocephalic.  Neck: Normal range of motion.  Respiratory: Effort normal.  Musculoskeletal: Normal range of motion.  Neurological: She is alert and oriented to person, place, and time.  Psychiatric: Her affect is labile. Her speech is rapid and/or pressured. She is hyperactive. Thought content is paranoid and delusional. Cognition and memory are normal. She expresses impulsivity. She is inattentive.    Review of Systems  Constitutional: Positive for weight loss.  HENT: Negative.   Eyes: Negative.   Respiratory: Negative.   Cardiovascular: Negative.   Gastrointestinal: Negative.   Genitourinary: Negative.   Musculoskeletal: Negative.   Skin: Negative.   Neurological: Negative.   Endo/Heme/Allergies: Negative.   Psychiatric/Behavioral: The patient has insomnia.     Blood pressure 136/72, pulse (!) 110, temperature 98.2 F (36.8 C), temperature source Oral, resp. rate 20, height 5\' 3"  (1.6 m), weight 38.6 kg (85 lb), SpO2 100 %.Body mass index is 15.06 kg/m.  General Appearance: casual  Eye Contact:  Good  Speech:  Pressured  Volume:  normal  Mood:  euphoric  Affect:  Labile  Thought Process:  Flight of ideas  Orientation:  Full (Time, Place, and Person)  Thought Content:  Illogical, Delusions and Hallucinations: Auditory  Suicidal Thoughts:  No  Homicidal Thoughts:  No  Memory:  Immediate;   Fair Recent;   Fair Remote;   Fair  Judgement:  Impaired  Insight:  Lacking  Psychomotor Activity:  Increased and Restlessness  Concentration:  Concentration: Poor and Attention Span: Poor  Recall:  [employer]Fair  Fund of  Knowledge:  Fair  Language:  Good  Akathisia:  No  Handed:  Right  AIMS (if indicated):     Assets:  Manufacturing systems engineerCommunication Skills Social Support  ADL's:  Intact  Cognition:  WNL  Sleep:   fair      Treatment Plan Summary: Daily contact with patient to assess and evaluate symptoms and progress in treatment and Medication management  Bipolar affective disorder, mania, moderate: -Crisis stabilization -Medication management:  Continue Zyprexa  10 mg BID for psychosis and Carbamazepine 200 mg bid for bipolar, Trazodone 50 mg at bedtime for sleep. -Individual counseling  Disposition:  Referred to [facility]   [place], [place], NP 04/30/2017, 1:34 PM  Patient seen, chart reviewed and case discussed with the treatment team for this face-to-face psychiatric evaluation and developed treatment plan. Reviewed the information documented and agree with the treatment plan.  Loyde Orth 04/30/2017 7:05 PM

## 2017-04-30 NOTE — ED Notes (Signed)
Pt declined to take PO meds, reports that she is not going to start them here4 because she is not going to take them when she gets home.

## 2017-05-01 LAB — CARBAMAZEPINE LEVEL, TOTAL: Carbamazepine Lvl: 6.6 ug/mL (ref 4.0–12.0)

## 2017-05-01 MED ORDER — OLANZAPINE 5 MG PO TBDP
15.0000 mg | ORAL_TABLET | Freq: Two times a day (BID) | ORAL | Status: DC
  Administered 2017-05-01 – 2017-05-02 (×2): 15 mg via ORAL
  Filled 2017-05-01 (×2): qty 1

## 2017-05-01 NOTE — Progress Notes (Signed)
LCSW following for disposition of needs: inpatient psychiatric admission.  [facility]  CSW confirmed with Gavin PoundDeborah that patient remains on the wait list for next available bed.  Stacy GardnerErin Elijan Googe, [facility] Emergency Room Clinical Social Worker [phone]

## 2017-05-01 NOTE — ED Notes (Signed)
SBAR Report received from previous nurse. Pt received calm and visible on unit. Pt denies current SI/ HI, A/V H, depression, anxiety, or pain at this time, and appears otherwise stable and free of distress but loose and tangential. Pt reminded of camera surveillance, q 15 min rounds, and rules of the milieu. Will continue to assess.

## 2017-05-01 NOTE — Consult Note (Signed)
[facility] Psych ED Progress Note  05/01/2017 4:07 PM Yvonne [name]  MRN:  [id number]   Subjective: ''I am [name], full blood [name] but tell me where I can get a dot.''  Objective: 51 yo female who presented to the ED with mania.  Pleasant but continues to have delusions but no flight of ideas or increase in energy.  She is taking care of her ADLs.    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 Manic behavior (HCC) [F30.10]   . Facial cellulitis [L03.211] 02/10/2014   Total Time spent with patient: 30 minutes  Past Psychiatric History: as above   Past Medical History:  Past Medical History:  Diagnosis Date  . Bipolar 1 disorder (HCC)   . Medical history non-contributory   . Mental disorder   . No pertinent past medical history     Past Surgical History:  Procedure Laterality Date  . NO PAST SURGERIES     Family History:  Family History  Problem Relation Age of Onset  . Depression Sister   . Depression Brother   . Depression Sister   . CAD Mother   . Hypertension Father   . Diabetes Father   . Bipolar disorder Cousin    Family Psychiatric  History:  Social History:  History  Alcohol Use No     History  Drug Use No    Social History   Social History  . Marital status: Single    Spouse name: N/A  . Number of children: N/A  . Years of education: N/A   Social History Main Topics  . Smoking status: Never Smoker  . Smokeless tobacco: Never Used  . Alcohol use No  . Drug use: No  . Sexual activity: No   Other Topics Concern  . None   Social History Narrative  . None    Sleep: Poor  Appetite:  Fair  Current Medications: Current Facility-Administered Medications  Medication Dose Route Frequency Provider Last Rate Last Dose  . acetaminophen (TYLENOL) tablet 650 mg  650 mg Oral Q4H PRN  Maczis, Elmer Sow, PA-C      . carbamazepine (TEGRETOL) tablet 200 mg  200 mg Oral BID PC Akintayo, Mojeed, MD   200 mg at 05/01/17 0858  . feeding supplement (ENSURE ENLIVE) (ENSURE ENLIVE) liquid 237 mL  237 mL Oral BID BM Green, Terri L, RPH   237 mL at 04/30/17 1351  . LORazepam (ATIVAN) injection 2 mg  2 mg Intramuscular Q8H PRN Burnard Leigh, MD      . LORazepam (ATIVAN) tablet 2 mg  2 mg Oral Q8H PRN Laveda Abbe, NP   2 mg at 04/29/17 4098  . OLANZapine zydis (ZYPREXA) disintegrating tablet 15 mg  15 mg Oral BID Charm Rings, NP      . traZODone (DESYREL) tablet 50 mg  50 mg Oral QHS Gerhard Munch, MD   50 mg at 04/30/17 2129   Current Outpatient Prescriptions  Medication Sig Dispense Refill  . carbamazepine (TEGRETOL XR) 200 MG 12 hr tablet Take 1 tablet (200 mg total) by mouth 2 (two) times daily. For mood stabilization (Patient not taking: Reported on 04/22/2017) 60 tablet 0  . hydrOXYzine (ATARAX/VISTARIL) 25 MG tablet Take 1 tablet (25 mg total) by mouth every 6 (six) hours as needed for anxiety. (Patient not taking: Reported  on 04/22/2017) 60 tablet 0  . propranolol (INDERAL) 10 MG tablet Take 1 tablet (10 mg total) by mouth 2 (two) times daily. For anxiety (Patient not taking: Reported on 04/22/2017) 60 tablet 0  . risperiDONE (RISPERDAL) 1 MG tablet Take 1 tablet (1 mg total) by mouth 2 (two) times daily. (Patient not taking: Reported on 04/22/2017) 60 tablet 0  . traZODone (DESYREL) 50 MG tablet Take 1 tablet (50 mg total) by mouth at bedtime as needed for sleep. (Patient not taking: Reported on 04/22/2017) 30 tablet 0    Lab Results:  Results for orders placed or performed during the hospital encounter of 04/22/17 (from the past 48 hour(s))  Carbamazepine level, total     Status: None   Collection Time: 05/01/17 12:21 PM  Result Value Ref Range   Carbamazepine Lvl 6.6 4.0 - 12.0 ug/mL    Comment: Performed at [facility], [address],  [place], [place] [address]    Blood Alcohol level:  Lab Results  Component Value Date   ETH <10 04/22/2017   ETH <5 02/13/2017    Physical Findings: AIMS:  , ,  ,  ,    CIWA:    COWS:     Musculoskeletal: Strength & Muscle Tone: within normal limits Gait & Station: normal Patient leans: N/A  Psychiatric Specialty Exam: Physical Exam  Constitutional: She is oriented to person, place, and time. She appears well-developed.  HENT:  Head: Normocephalic.  Neck: Normal range of motion.  Respiratory: Effort normal.  Musculoskeletal: Normal range of motion.  Neurological: She is alert and oriented to person, place, and time.  Psychiatric: Her affect is labile. Her speech is rapid and/or pressured. She is hyperactive. Thought content is paranoid and delusional. Cognition and memory are normal. She expresses impulsivity. She is inattentive.    Review of Systems  Constitutional: Positive for weight loss.  HENT: Negative.   Eyes: Negative.   Respiratory: Negative.   Cardiovascular: Negative.   Gastrointestinal: Negative.   Genitourinary: Negative.   Musculoskeletal: Negative.   Skin: Negative.   Neurological: Negative.   Endo/Heme/Allergies: Negative.   Psychiatric/Behavioral: The patient has insomnia.     Blood pressure 123/76, pulse (!) 120, temperature 98.8 F (37.1 C), temperature source Oral, resp. rate 18, height 5\' 3"  (1.6 m), weight 38.6 kg (85 lb), SpO2 98 %.Body mass index is 15.06 kg/m.  General Appearance: casual  Eye Contact:  Good  Speech:  Pressured  Volume:  normal  Mood:  euphoric  Affect:  congruent  Thought Process:  delusions  Orientation:  Full (Time, Place, and Person)  Thought Content:  Delusions   Suicidal Thoughts:  No  Homicidal Thoughts:  No  Memory:  Immediate;   Fair Recent;   Fair Remote;   Fair  Judgement:  Impaired  Insight:  Lacking  Psychomotor Activity:  Normal  Concentration:  Concentration: Poor and Attention Span: Poor  Recall:   [employer] of Knowledge:  Fair  Language:  Good  Akathisia:  No  Handed:  Right  AIMS (if indicated):     Assets:  Manufacturing systems engineer Social Support  ADL's:  Intact  Cognition:  WNL  Sleep:   fair      Treatment Plan Summary: Daily contact with patient to assess and evaluate symptoms and progress in treatment and Medication management  Bipolar affective disorder, mania, moderate: -Crisis stabilization -Medication management:  Increase Zyprexa  15 mg BID for psychosis and Carbamazepine 200 mg bid for bipolar,  Trazodone 50 mg at bedtime for sleep. -Individual counseling  [facility]: [facility]   MelfaLORD, [place] Carolina[place], NP 05/01/2017, 4:07 PM  Patient seen, chart reviewed and case discussed with the treatment team for this face-to-face psychiatric evaluation and developed treatment plan. Reviewed the information documented and agree with the treatment plan.  Elani Delph 05/03/2017 12:02 PM

## 2017-05-01 NOTE — ED Notes (Signed)
At shift change, pt was at desk demanding breakfast. "I'm pregnant! Y'all can't be keeping food from me when I'm pregnant." Pt was reminded of approximate time when breakfast trays arrive. Pt returned to room and is now resting in bed.

## 2017-05-02 MED ORDER — HYDROXYZINE HCL 25 MG PO TABS: 25.0000 mg | ORAL_TABLET | Freq: Four times a day (QID) | ORAL | 0 refills | Status: DC | PRN

## 2017-05-02 MED ORDER — TRAZODONE HCL 50 MG PO TABS: 50.0000 mg | ORAL_TABLET | Freq: Every evening | ORAL | 0 refills | Status: DC | PRN

## 2017-05-02 MED ORDER — OLANZAPINE 15 MG PO TBDP: 15.0000 mg | ORAL_TABLET | Freq: Two times a day (BID) | ORAL | 0 refills | Status: DC

## 2017-05-02 MED ORDER — CARBAMAZEPINE ER 200 MG PO TB12: 200.0000 mg | ORAL_TABLET | Freq: Two times a day (BID) | ORAL | 0 refills | Status: DC

## 2017-05-02 NOTE — [facility] Suicide Risk Assessment (Signed)
Suicide Risk Assessment  Discharge Assessment   [facility]   Principal Problem: Severe bipolar affective disorder with psychosis [facility](HCC) Discharge Diagnoses:  Patient Active Problem List   Diagnosis Date Noted  . 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 Manic behavior (HCC) [F30.10]   . Facial cellulitis [L03.211] 02/10/2014    Total Time spent with patient: 45 minutes  Musculoskeletal: Strength & Muscle Tone: within normal limits Gait & Station: normal Patient leans: N/A  Psychiatric Specialty Exam: Physical Exam  Constitutional: She is oriented to person, place, and time. She appears well-developed and well-nourished.  HENT:  Head: Normocephalic.  Neck: Normal range of motion.  Respiratory: Effort normal.  Musculoskeletal: Normal range of motion.  Neurological: She is alert and oriented to person, place, and time.  Psychiatric: She has a normal mood and affect. Her speech is normal and behavior is normal. Judgment normal. Thought content is delusional. Cognition and memory are normal.    Review of Systems  All other systems reviewed and are negative.   Blood pressure 112/67, pulse (!) 120, temperature 98.1 F (36.7 C), temperature source Oral, resp. rate 16, height 5\' 3"  (1.6 m), weight 38.6 kg (85 lb), SpO2 99 %.Body mass index is 15.06 kg/m.  General Appearance: Casual  Eye Contact:  Good  Speech:  Normal Rate  Volume:  Normal  Mood:  Euthymic  Affect:  Congruent  Thought Process:  Coherent and Descriptions of Associations: Intact  Orientation:  Full (Time, Place, and Person)  Thought Content:  Delusions  Suicidal Thoughts:  No  Homicidal Thoughts:  No  Memory:  Immediate;   Good Recent;   Good Remote;   Good  Judgement:  Fair  Insight:  Fair  Psychomotor Activity:  Normal  Concentration:  Concentration: Good and Attention Span: Good  Recall:  Good  Fund of Knowledge:  Fair  Language:   Good  Akathisia:  No  Handed:  Right  AIMS (if indicated):     Assets:  Housing Leisure Time Physical Health Resilience Social Support  ADL's:  Intact  Cognition:  WNL  Sleep:      Mental Status Per Nursing Assessment::   On Admission:   psychosis, mania  Demographic Factors:  Living alone  Loss Factors: NA  Historical Factors: NA  Risk Reduction Factors:   Sense of responsibility to family, Positive social support and Positive therapeutic relationship  Continued Clinical Symptoms:  Delusional, pleasant  Cognitive Features That Contribute To Risk:  None    Suicide Risk:  Minimal: No identifiable suicidal ideation.  Patients presenting with no risk factors but with morbid ruminations; may be classified as minimal risk based on the severity of the depressive symptoms    Plan Of Care/Follow-up recommendations:  Activity:  as tolerated Diet:  heart healthy diet  Maryjayne Kleven, NP 05/02/2017, 10:30 AM

## 2017-05-02 NOTE — Consult Note (Signed)
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 Manic behavior (HCC) [F30.10]   . Facial cellulitis [L03.211] 02/10/2014    Total Time spent with patient: 30 minutes  Subjective:   Yvonne [name] is a 51 y.o. female patient has stabilized.  HPI:  51 yo female who came with agitation, mania, and psychosis.  Medications were started and she stabilized.  Still has delusions but pleasantly delusions.  Compliant with medications, no suicidal/homicidal ideations, hallucinations, or substance abuse.  Stable for discharges.  Past Psychiatric History: bipolar disorder  Risk to Self: NOne Risk to Others: None Prior Inpatient Therapy: Prior Inpatient Therapy: Yes Prior Therapy Dates: 12/2016 [facility] Reason for Treatment: psychosis Prior Outpatient Therapy: Prior Outpatient Therapy:  (unknown) Prior Therapy Dates: UTA Prior Therapy Facilty/Provider(s): UTA Reason for Treatment: UTA Does patient have an ACCT team?: Unknown Does patient have Intensive In-House Services?  : Unknown Does patient have [facility] services? : Unknown Does patient have P4CC services?: Unknown  Past Medical History:  Past Medical History:  Diagnosis Date  . Bipolar 1 disorder (HCC)   . Medical history non-contributory   . Mental disorder   . No pertinent past medical history     Past Surgical History:  Procedure Laterality Date  . NO PAST SURGERIES     Family History:  Family History  Problem Relation Age of Onset  . Depression Sister   . Depression Brother   . Depression Sister   . CAD Mother   . Hypertension Father   . Diabetes  Father   . Bipolar disorder Cousin    Family Psychiatric  History: unknown Social History:  History  Alcohol Use No     History  Drug Use No    Social History   Social History  . Marital status: Single    Spouse name: N/A  . Number of children: N/A  . Years of education: N/A   Social History Main Topics  . Smoking status: Never Smoker  . Smokeless tobacco: Never Used  . Alcohol use No  . Drug use: No  . Sexual activity: No   Other Topics Concern  . None   Social History Narrative  . None   Additional Social History:    Allergies:  No Known Allergies  Labs:  Results for orders placed or performed during the hospital encounter of 04/22/17 (from the past 48 hour(s))  Carbamazepine level, total     Status: None   Collection Time: 05/01/17 12:21 PM  Result Value Ref Range   Carbamazepine Lvl 6.6 4.0 - 12.0 ug/mL    Comment: Performed at [facility], [address] [place], [place] [address]    Current Facility-Administered Medications  Medication Dose Route Frequency Provider Last Rate Last Dose  . acetaminophen (TYLENOL) tablet 650 mg  650 mg Oral Q4H PRN Maczis, Elmer SowMichael M, PA-C      . carbamazepine (TEGRETOL) tablet 200 mg  200 mg Oral BID PC Zohair Epp, MD   200 mg at 05/01/17 1737  . feeding supplement (ENSURE ENLIVE) (ENSURE ENLIVE) liquid 237 mL  237 mL Oral BID BM Green, Terri L, RPH   237 mL at  05/02/17 1008  . LORazepam (ATIVAN) injection 2 mg  2 mg Intramuscular Q8H PRN Burnard Leigh, MD      . LORazepam (ATIVAN) tablet 2 mg  2 mg Oral Q8H PRN Laveda Abbe, NP   2 mg at 04/29/17 8119  . OLANZapine zydis (ZYPREXA) disintegrating tablet 15 mg  15 mg Oral BID Charm Rings, NP   15 mg at 05/02/17 0959  . traZODone (DESYREL) tablet 50 mg  50 mg Oral QHS Gerhard Munch, MD   50 mg at 05/01/17 2109   Current Outpatient Prescriptions  Medication Sig Dispense Refill  . carbamazepine (TEGRETOL XR) 200 MG 12 hr tablet Take 1  tablet (200 mg total) by mouth 2 (two) times daily. For mood stabilization (Patient not taking: Reported on 04/22/2017) 60 tablet 0  . hydrOXYzine (ATARAX/VISTARIL) 25 MG tablet Take 1 tablet (25 mg total) by mouth every 6 (six) hours as needed for anxiety. (Patient not taking: Reported on 04/22/2017) 60 tablet 0  . propranolol (INDERAL) 10 MG tablet Take 1 tablet (10 mg total) by mouth 2 (two) times daily. For anxiety (Patient not taking: Reported on 04/22/2017) 60 tablet 0  . risperiDONE (RISPERDAL) 1 MG tablet Take 1 tablet (1 mg total) by mouth 2 (two) times daily. (Patient not taking: Reported on 04/22/2017) 60 tablet 0  . traZODone (DESYREL) 50 MG tablet Take 1 tablet (50 mg total) by mouth at bedtime as needed for sleep. (Patient not taking: Reported on 04/22/2017) 30 tablet 0    Musculoskeletal: Strength & Muscle Tone: within normal limits Gait & Station: normal Patient leans: N/A  Psychiatric Specialty Exam: Physical Exam  Constitutional: She is oriented to person, place, and time. She appears well-developed and well-nourished.  HENT:  Head: Normocephalic.  Neck: Normal range of motion.  Respiratory: Effort normal.  Musculoskeletal: Normal range of motion.  Neurological: She is alert and oriented to person, place, and time.  Psychiatric: She has a normal mood and affect. Her speech is normal and behavior is normal. Judgment normal. Thought content is delusional. Cognition and memory are normal.    Review of Systems  All other systems reviewed and are negative.   Blood pressure 112/67, pulse (!) 120, temperature 98.1 F (36.7 C), temperature source Oral, resp. rate 16, height 5\' 3"  (1.6 m), weight 38.6 kg (85 lb), SpO2 99 %.Body mass index is 15.06 kg/m.  General Appearance: Casual  Eye Contact:  Good  Speech:  Normal Rate  Volume:  Normal  Mood:  Euthymic  Affect:  Congruent  Thought Process:  Coherent and Descriptions of Associations: Intact  Orientation:  Full (Time,  Place, and Person)  Thought Content:  Delusions  Suicidal Thoughts:  No  Homicidal Thoughts:  No  Memory:  Immediate;   Good Recent;   Good Remote;   Good  Judgement:  Fair  Insight:  Fair  Psychomotor Activity:  Normal  Concentration:  Concentration: Good and Attention Span: Good  Recall:  Good  Fund of Knowledge:  Fair  Language:  Good  Akathisia:  No  Handed:  Right  AIMS (if indicated):     Assets:  Housing Leisure Time Physical Health Resilience Social Support  ADL's:  Intact  Cognition:  WNL  Sleep:        Treatment Plan Summary: Daily contact with patient to assess and evaluate symptoms and progress in treatment, Medication management and Plan biipolar affective disorder, most recent episode mania with psychosis:  -Crisis stabilization -Medication management:  Continued  Zyprexa 15 mg BID for mood stabilization/psychosis and Tegretol 200 mg BID for mood stabilization, Trazodone 50 mg at bedtime for sleep -Individual counseling  Disposition: No evidence of imminent risk to self or others at present.    [name], NP 05/02/2017 10:21 AM  Patient seen face-to-face for psychiatric evaluation, chart reviewed and case discussed with the physician extender and developed treatment plan. Reviewed the information documented and agree with the treatment plan. Thedore Mins, MD

## 2017-05-02 NOTE — ED Notes (Signed)
Pt sister on unit, reports she is here to drop off pt items, pt sister informed pt that pt could not be discharged home with her.

## 2017-05-02 NOTE — Discharge Instructions (Signed)
For your behavioral health needs you are advised to follow up with [facility].  New patients are seen at their walk-in clinic.  Walk-in hours are Monday - Friday from 8:00 am - 12:00 pm, and from 1:00 pm - 3:00 pm.  Walk-in patients are seen on a first come, first served basis, so try to arrive as early as possible for the best chance of being seen the same day.  There is an initial fee of $22.50: ° °     [facility] °     [address] °     [place], [place] [address] °     [phone] °

## 2017-05-02 NOTE — ED Notes (Signed)
Pt d/c home per MD order. Discharge summary reviewed with pt. Pt verbalizes understanding. RX provided. Pt denies SI/HI. Personal property returned to pt. Clothing given to pt from clothes supply from chaplain d/t pt needing pants. Bus pass provided per pt request. Pt signed e-signature. Ambulatory off unit with [facility].

## 2017-05-02 NOTE — BH Assessment (Signed)
[facility] Assessment Progress Note  Per Mojeed Akintayo, MD, this pt does not require psychiatric hospitalization at this time.  [facility] is [facility] be [facility] from [facility] with recommendation to follow up with [facility].  This has been included in pt's discharge instructions.  Pt's nurse, [name], has been notified.  Janene Yousuf, MA Triage Specialist [phone]     

## 2017-05-02 NOTE — BH Assessment (Signed)
[facility] Assessment Progress Note  At 9:15 [name] calls from [facility].  He reports that pt remains on their wait list.  [name], MA Triage Specialist [phone]

## 2017-05-07 ENCOUNTER — Emergency Department (HOSPITAL_COMMUNITY)
Admission: EM | Admit: 2017-05-07 | Discharge: 2017-05-08 | Disposition: A | Discharge status: Home / Self Care | Payer: Medicaid Other | Attending: Emergency Medicine | Admitting: Emergency Medicine

## 2017-05-07 DIAGNOSIS — F29 Unspecified psychosis not due to a substance or known physiological condition: Secondary | ICD-10-CM | POA: Diagnosis not present

## 2017-05-07 DIAGNOSIS — Z79899 Other long term (current) drug therapy: Secondary | ICD-10-CM | POA: Diagnosis not present

## 2017-05-07 DIAGNOSIS — F319 Bipolar disorder, unspecified: Secondary | ICD-10-CM | POA: Diagnosis present

## 2017-05-07 DIAGNOSIS — F3161 Bipolar disorder, current episode mixed, mild: Secondary | ICD-10-CM | POA: Insufficient documentation

## 2017-05-07 DIAGNOSIS — R451 Restlessness and agitation: Secondary | ICD-10-CM | POA: Insufficient documentation

## 2017-05-07 DIAGNOSIS — F919 Conduct disorder, unspecified: Secondary | ICD-10-CM | POA: Diagnosis present

## 2017-05-07 LAB — CBC
HCT: 28.3 % — ABNORMAL LOW (ref 36.0–46.0)
Hemoglobin: 9.4 g/dL — ABNORMAL LOW (ref 12.0–15.0)
MCH: 28.7 pg (ref 26.0–34.0)
MCHC: 33.2 g/dL (ref 30.0–36.0)
MCV: 86.5 fL (ref 78.0–100.0)
PLATELETS: 371 10*3/uL (ref 150–400)
RBC: 3.27 MIL/uL — ABNORMAL LOW (ref 3.87–5.11)
RDW: 14.4 % (ref 11.5–15.5)
WBC: 3.3 10*3/uL — ABNORMAL LOW (ref 4.0–10.5)

## 2017-05-07 LAB — URINALYSIS, ROUTINE W REFLEX MICROSCOPIC
Bilirubin Urine: NEGATIVE
Glucose, UA: NEGATIVE mg/dL
Hgb urine dipstick: NEGATIVE
KETONES UR: NEGATIVE mg/dL
LEUKOCYTES UA: NEGATIVE
NITRITE: NEGATIVE
PROTEIN: NEGATIVE mg/dL
Specific Gravity, Urine: 1.006 (ref 1.005–1.030)
pH: 7 (ref 5.0–8.0)

## 2017-05-07 LAB — COMPREHENSIVE METABOLIC PANEL
ALK PHOS: 81 U/L (ref 38–126)
ALT: 43 U/L (ref 14–54)
ANION GAP: 9 (ref 5–15)
AST: 34 U/L (ref 15–41)
Albumin: 2.8 g/dL — ABNORMAL LOW (ref 3.5–5.0)
BILIRUBIN TOTAL: 0.3 mg/dL (ref 0.3–1.2)
BUN: 17 mg/dL (ref 6–20)
CALCIUM: 8.3 mg/dL — AB (ref 8.9–10.3)
CO2: 25 mmol/L (ref 22–32)
CREATININE: 0.6 mg/dL (ref 0.44–1.00)
Chloride: 106 mmol/L (ref 101–111)
Glucose, Bld: 101 mg/dL — ABNORMAL HIGH (ref 65–99)
Potassium: 3.3 mmol/L — ABNORMAL LOW (ref 3.5–5.1)
Sodium: 140 mmol/L (ref 135–145)
TOTAL PROTEIN: 7 g/dL (ref 6.5–8.1)

## 2017-05-07 LAB — PREGNANCY, URINE: PREG TEST UR: NEGATIVE

## 2017-05-07 LAB — RAPID URINE DRUG SCREEN, HOSP PERFORMED
Amphetamines: NOT DETECTED
BARBITURATES: NOT DETECTED
BENZODIAZEPINES: NOT DETECTED
Cocaine: NOT DETECTED
Opiates: NOT DETECTED
Tetrahydrocannabinol: NOT DETECTED

## 2017-05-07 LAB — ETHANOL: Alcohol, Ethyl (B): <10 mg/dL (ref <10)

## 2017-05-07 MED ORDER — ARIPIPRAZOLE 10 MG PO TABS
10.0000 mg | ORAL_TABLET | Freq: Every day | ORAL | Status: DC
  Administered 2017-05-07 – 2017-05-08 (×2): 10 mg via ORAL
  Filled 2017-05-07 (×2): qty 1

## 2017-05-07 MED ORDER — ONDANSETRON HCL 4 MG PO TABS: 4.0000 mg | ORAL_TABLET | Freq: Three times a day (TID) | ORAL | Status: DC | PRN

## 2017-05-07 MED ORDER — CARBAMAZEPINE ER 200 MG PO TB12
200.0000 mg | ORAL_TABLET | Freq: Two times a day (BID) | ORAL | Status: DC
  Filled 2017-05-07: qty 1

## 2017-05-07 MED ORDER — POTASSIUM CHLORIDE 20 MEQ/15ML (10%) PO SOLN
40.0000 meq | Freq: Once | ORAL | Status: AC
  Administered 2017-05-07: 40 meq via ORAL
  Filled 2017-05-07 (×2): qty 30

## 2017-05-07 MED ORDER — ARIPIPRAZOLE 5 MG PO TABS: 5.0000 mg | ORAL_TABLET | Freq: Every day | ORAL | Status: DC

## 2017-05-07 MED ORDER — TRAZODONE HCL 50 MG PO TABS: 50.0000 mg | ORAL_TABLET | Freq: Every evening | ORAL | Status: DC | PRN

## 2017-05-07 MED ORDER — OLANZAPINE 5 MG PO TBDP: 15.0000 mg | ORAL_TABLET | Freq: Two times a day (BID) | ORAL | Status: DC

## 2017-05-07 MED ORDER — OLANZAPINE 5 MG PO TBDP
15.0000 mg | ORAL_TABLET | Freq: Two times a day (BID) | ORAL | Status: DC
  Filled 2017-05-07: qty 1

## 2017-05-07 MED ORDER — ALUM & MAG HYDROXIDE-SIMETH 200-200-20 MG/5ML PO SUSP: 30.0000 mL | Freq: Four times a day (QID) | ORAL | Status: DC | PRN

## 2017-05-07 MED ORDER — HYDROXYZINE HCL 25 MG PO TABS: 25.0000 mg | ORAL_TABLET | Freq: Four times a day (QID) | ORAL | Status: DC | PRN

## 2017-05-07 MED ORDER — PROPRANOLOL HCL 10 MG PO TABS
10.0000 mg | ORAL_TABLET | Freq: Two times a day (BID) | ORAL | Status: DC
  Filled 2017-05-07 (×3): qty 1

## 2017-05-07 NOTE — ED Notes (Signed)
Bed: [id number] Expected date:  Expected time:  Means of arrival:  Comments: [facility] pt with [facility]

## 2017-05-07 NOTE — Progress Notes (Signed)
Pt A & O to self and place. Denies SI, HI, AVh and pain. Ambulatory to [facility] from [facility] with a steady gait. Per report and notes, pt has been noncompliant with her medications,leading to this admission. Presents with blunted affect, rapid / tangential speech with fixed grandiose delusions on interactions. Per pt "I had to sleep on the bare concrete floor Oliviet and I have a 28 bedroom house". "I have been going from hospital to hospital, playing merri-go around, I want to go home and see my husband, Minna MerrittsOliviet". Emotional support and availability offered to pt. Encouraged pt to voice concerns. Q 15 minutes safety checks maintained without self harm gestures or outburst to note at this time.

## 2017-05-07 NOTE — ED Notes (Signed)
SBAR Report received from previous nurse. Pt received asleepon unit.  and appears otherwise stable and free of distress. Pt reminded of camera surveillance, q 15 min rounds, and rules of the milieu. Will continue to assess.
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
## 2017-05-07 NOTE — ED Notes (Signed)
PT STATES SHE ONLY TAKES ABILIFY AND NOTHING ELSE

## 2017-05-07 NOTE — ED Notes (Signed)
ETHANOL SAMPLE SENT TO LAB

## 2017-05-07 NOTE — ED Provider Notes (Signed)
[facility]-EMERGENCY DEPT Provider Note   CSN: [id number] Arrival date & time: 05/07/17  0230     History   Chief Complaint Chief Complaint  Patient presents with  . Medical Clearance    HPI Yvonne [name] is a 51 y.o. female.  HPI Yvonne [name] is a 51 y.o. female with history of bipolar disorder, manic behavior, [facility], with complaint of disruptive behavior.  Patient states she is not sure why she is here.  She states that she has not been home in over a year, states she is going from hospital to hospital.  She states she is not having any problems.  Patient is a poor historian. States she was [place] the way to "[place]" and was just around the corner from it when police took her back to [facility] and then here and she is not sure why. States she has not seen her family or children in over a year. Stats she believes she is currently pregnant and has septuptlets at home.   Past Medical History:  Diagnosis Date  . Bipolar 1 disorder (HCC)   . Medical history non-contributory   . Mental disorder   . No pertinent past medical history     Patient Active Problem List   Diagnosis Date Noted  . 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 Manic behavior (HCC)   . Facial cellulitis 02/10/2014    Past Surgical History:  Procedure Laterality Date  . NO PAST SURGERIES      OB History    No data available       Home Medications    Prior to Admission medications   Medication Sig Start Date End Date Taking? Authorizing Provider  carbamazepine (TEGRETOL XR) 200 MG 12 hr tablet Take 1 tablet (200 mg total) by mouth 2 (two) times daily. For mood stabilization 05/02/17   Charm RingsLord, Jamison Y, NP  hydrOXYzine (ATARAX/VISTARIL) 25 MG tablet Take 1 tablet (25 mg total) by mouth every 6 (six) hours as needed for anxiety. 05/02/17   Charm RingsLord, Jamison Y, NP  OLANZapine zydis (ZYPREXA) 15 MG disintegrating  tablet Take 1 tablet (15 mg total) by mouth 2 (two) times daily. 05/02/17   Charm RingsLord, Jamison Y, NP  propranolol (INDERAL) 10 MG tablet Take 1 tablet (10 mg total) by mouth 2 (two) times daily. For anxiety Patient not taking: Reported on 04/22/2017 01/14/17   Armandina StammerNwoko, Agnes I, NP  risperiDONE (RISPERDAL) 1 MG tablet Take 1 tablet (1 mg total) by mouth 2 (two) times daily. Patient not taking: Reported on 04/22/2017 02/17/17   Charm RingsLord, Jamison Y, NP  traZODone (DESYREL) 50 MG tablet Take 1 tablet (50 mg total) by mouth at bedtime as needed for sleep. 05/02/17   Charm RingsLord, Jamison Y, NP    Family History Family History  Problem Relation Age of Onset  . Depression Sister   . Depression Brother   . Depression Sister   . CAD Mother   . Hypertension Father   . Diabetes Father   . Bipolar disorder Cousin     Social History Social History  Substance Use Topics  . Smoking status: Never Smoker  . Smokeless tobacco: Never Used  . Alcohol use No     Allergies   Patient has no known allergies.   Review of Systems Review of Systems  Unable to perform ROS: Psychiatric disorder     Physical Exam Updated Vital Signs BP 100/61 (BP Location: Left  Arm)   Pulse 100   Temp 98.4 F (36.9 C) (Oral)   Resp 16   Ht 5\' 3"  (1.6 m)   Wt 44.5 kg (98 lb)   SpO2 100%   BMI 17.36 kg/m   Physical Exam  Constitutional: She appears well-developed and well-nourished. No distress.  HENT:  Head: Normocephalic.  Eyes: Conjunctivae are normal.  Neck: Neck supple.  Cardiovascular: Normal rate, regular rhythm and normal heart sounds.   Pulmonary/Chest: Effort normal and breath sounds normal. No respiratory distress. She has no wheezes. She has no rales.  Abdominal: Soft. Bowel sounds are normal. She exhibits no distension. There is no tenderness. There is no rebound.  Musculoskeletal: She exhibits no edema.  Neurological: She is alert.  Skin: Skin is warm and dry.  Psychiatric: Her affect is angry. Her speech is  rapid and/or pressured. She is hyperactive.  Nursing note and vitals reviewed.    ED Treatments / Results  Labs (all labs ordered are listed, but only abnormal results are displayed) Labs Reviewed  COMPREHENSIVE METABOLIC PANEL - Abnormal; Notable for the following:       Result Value   Potassium 3.3 (*)    Glucose, Bld 101 (*)    Calcium 8.3 (*)    Albumin 2.8 (*)    All other components within normal limits  CBC - Abnormal; Notable for the following:    WBC 3.3 (*)    RBC 3.27 (*)    Hemoglobin 9.4 (*)    HCT 28.3 (*)    All other components within normal limits  RAPID URINE DRUG SCREEN, HOSP PERFORMED  ETHANOL  URINALYSIS, ROUTINE W REFLEX MICROSCOPIC  PREGNANCY, URINE    EKG  EKG Interpretation None       Radiology No results found.  Procedures Procedures (including critical care time)  Medications Ordered in ED Medications  ondansetron (ZOFRAN) tablet 4 mg (not administered)  alum & mag hydroxide-simeth (MAALOX/MYLANTA) 200-200-20 MG/5ML suspension 30 mL (not administered)     Initial Impression / Assessment and Plan / ED Course  I have reviewed the triage vital signs and the nursing notes.  Pertinent labs & imaging results that were available during my care of the patient were reviewed by me and considered in my medical decision making (see chart for details).     With acute agitation, flight of ideas, tangential speech.  Some delusional thoughts as well.  She believes that she is currently pregnant.  Will get labs, urine pregnancy will monitor.   Patient is not pregnant.  Her labs are unremarkable.  Potassium slightly low, replenished with 40 mEq p.o.  We will get TTS assessment.   Patient assessed by TTS.  Advised inpatient admission.  Vitals:   05/07/17 0247 05/07/17 0304 05/07/17 0640 05/07/17 1119  BP:  111/75 100/61 106/68  Pulse:  95 100 (!) 102  Resp:  16 16 18   Temp:  97.9 F (36.6 C) 98.4 F (36.9 C) 98.4 F (36.9 C)  TempSrc:   Oral Oral Oral  SpO2:  100% 100% 100%  Weight: 44.5 kg (98 lb)     Height: 5\' 3"  (1.6 m)        Final Clinical Impressions(s) / ED Diagnoses   Final diagnoses:  Psychosis, unspecified psychosis type [facility])    New Prescriptions New Prescriptions   No medications on file     Jaynie Crumble, PA-C 05/07/17 1529    Lorre Nick, MD 05/08/17 1501

## 2017-05-07 NOTE — ED Triage Notes (Signed)
Pt brought in by [facility], [facility] for [facility], hx anxiety, depression, bipolar.

## 2017-05-07 NOTE — ED Notes (Signed)
IVC PAPERS PRESENT 

## 2017-05-08 DIAGNOSIS — F3161 Bipolar disorder, current episode mixed, mild: Secondary | ICD-10-CM

## 2017-05-08 DIAGNOSIS — F29 Unspecified psychosis not due to a substance or known physiological condition: Secondary | ICD-10-CM

## 2017-05-08 DIAGNOSIS — Z818 Family history of other mental and behavioral disorders: Secondary | ICD-10-CM

## 2017-05-08 MED ORDER — ARIPIPRAZOLE 10 MG PO TABS: 10.0000 mg | ORAL_TABLET | Freq: Every day | ORAL | 0 refills | Status: DC

## 2017-05-08 NOTE — ED Notes (Signed)
Patient denies pain and is resting comfortably.  

## 2017-05-08 NOTE — Consult Note (Signed)
[facility] Psychiatry Consult   Reason for Consult:  Psychosis  Referring Physician:  EDP Patient Identification: Yvonne [name] MRN:  [id number] Principal Diagnosis: Bipolar affective disorder, current episode mild ([place]) Diagnosis:   Patient Active Problem List   Diagnosis Date Noted  . Severe bipolar affective disorder with psychosis ([place]) [F31.89]     Priority: High  . Bipolar affective disorder, current episode mild ([place]) [F31.9] 01/05/2017    Priority: High  . Manic behavior ([place]) [F30.10]   . Facial cellulitis [A83.419] 02/10/2014    Total Time spent with patient: 45 minutes  Subjective:   Yvonne [name] is a 51 y.o. female patient does not warrant admission.  HPI:  51 yo female who was IVC'd for psychosis, however, she was better than this provider has ever seen her which has been many times.  She was calm on assessment yesterday but irritated that she was sent here.  Pleasantly delusional and grandiose as is her baseline but no aggression or suicidal/homicidal ideations, hallucinations, or substance abuse.  She wanted to go home but did admit she had not been taking her "Abilify" regularly.  Restarted and compliant with her medications, stable for discharge.  Rx for Abilify provided with encouragement to take it daily, a long term injectable is recommended if she returns with similar issues.    Past Psychiatric History: bipolar disorder  Risk to Self: Suicidal Ideation: No Suicidal Intent: No Is patient at risk for suicide?: No Suicidal Plan?: No Access to Means: No What has been your use of drugs/alcohol within the last 12 months?: Denies How many times?:  (Pt denies) Other Self Harm Risks:  (NA) Triggers for Past Attempts: None known Intentional Self Injurious Behavior: None Risk to Others: Homicidal Ideation: No Thoughts of Harm to Others: No Current Homicidal Intent: No Current Homicidal Plan: No Access to Homicidal Means: No Identified Victim: NA History of  harm to others?: No Assessment of Violence: None Noted Violent Behavior Description: NA Does patient have access to weapons?: No Criminal Charges Pending?:  (UTA) Does patient have a court date:  (UTA) Court Date:  (UTA) Prior Inpatient Therapy: Prior Inpatient Therapy: Yes Prior Therapy Dates: 12/2016 Prior Therapy Facilty/Provider(s): [facility] Reason for Treatment: psychosis Prior Outpatient Therapy: Prior Outpatient Therapy:  (UTA) Prior Therapy Dates:  [facility]) Prior Therapy Facilty/Provider(s):  (UTA) Reason for Treatment:  (UTA) Does patient have an ACCT team?: Unknown Does patient have Intensive In-House Services?  : Unknown Does patient have [facility] services? : Yes (IVC from [facility]) Does patient have P4CC services?: Unknown  Past Medical History:  Past Medical History:  Diagnosis Date  . Bipolar 1 disorder ([place])   . Medical history non-contributory   . Mental disorder   . No pertinent past medical history     Past Surgical History:  Procedure Laterality Date  . NO PAST SURGERIES     Family History:  Family History  Problem Relation Age of Onset  . Depression Sister   . Depression Brother   . Depression Sister   . CAD Mother   . Hypertension Father   . Diabetes Father   . Bipolar disorder Cousin    Family Psychiatric  History: unknown Social History:  History  Alcohol Use No     History  Drug Use No    Social History   Social History  . Marital status: Single    Spouse name: N/A  . Number of children: N/A  . Years of education: N/A   Social History Main Topics  .  Smoking status: Never Smoker  . Smokeless tobacco: Never Used  . Alcohol use No  . Drug use: No  . Sexual activity: No   Other Topics Concern  . Not on file   Social History Narrative  . No narrative on file   Additional Social History:    Allergies:  No Known Allergies  Labs:  Results for orders placed or performed during the hospital encounter of 05/07/17 (from the past 48  hour(s))  Comprehensive metabolic panel     Status: Abnormal   Collection Time: 05/07/17  3:33 AM  Result Value Ref Range   Sodium 140 135 - 145 mmol/L   Potassium 3.3 (L) 3.5 - 5.1 mmol/L   Chloride 106 101 - 111 mmol/L   CO2 25 22 - 32 mmol/L   Glucose, Bld 101 (H) 65 - 99 mg/dL   BUN 17 6 - 20 mg/dL   Creatinine, Ser 0.60 0.44 - 1.00 mg/dL   Calcium 8.3 (L) 8.9 - 10.3 mg/dL   Total Protein 7.0 6.5 - 8.1 g/dL   Albumin 2.8 (L) 3.5 - 5.0 g/dL   AST 34 15 - 41 U/L   ALT 43 14 - 54 U/L   Alkaline Phosphatase 81 38 - 126 U/L   Total Bilirubin 0.3 0.3 - 1.2 mg/dL   GFR calc non Af Amer >60 >60 mL/min   GFR calc Af Amer >60 >60 mL/min    Comment: (NOTE) The eGFR has been calculated using the CKD EPI equation. This calculation has not been validated in all clinical situations. eGFR's persistently <60 mL/min signify possible Chronic Kidney Disease.    Anion gap 9 5 - 15  cbc     Status: Abnormal   Collection Time: 05/07/17  3:33 AM  Result Value Ref Range   WBC 3.3 (L) 4.0 - 10.5 K/uL   RBC 3.27 (L) 3.87 - 5.11 MIL/uL   Hemoglobin 9.4 (L) 12.0 - 15.0 g/dL   HCT 28.3 (L) 36.0 - 46.0 %   MCV 86.5 78.0 - 100.0 fL   MCH 28.7 26.0 - 34.0 pg   MCHC 33.2 30.0 - 36.0 g/dL   RDW 14.4 11.5 - 15.5 %   Platelets 371 150 - 400 K/uL  Rapid urine drug screen (hospital performed)     Status: None   Collection Time: 05/07/17  3:33 AM  Result Value Ref Range   Opiates NONE DETECTED NONE DETECTED   Cocaine NONE DETECTED NONE DETECTED   Benzodiazepines NONE DETECTED NONE DETECTED   Amphetamines NONE DETECTED NONE DETECTED   Tetrahydrocannabinol NONE DETECTED NONE DETECTED   Barbiturates NONE DETECTED NONE DETECTED    Comment:        DRUG SCREEN FOR MEDICAL PURPOSES ONLY.  IF CONFIRMATION IS NEEDED FOR ANY PURPOSE, NOTIFY LAB WITHIN 5 DAYS.        LOWEST DETECTABLE LIMITS FOR URINE DRUG SCREEN Drug Class       Cutoff (ng/mL) Amphetamine      1000 Barbiturate      200 Benzodiazepine    485 Tricyclics       462 Opiates          300 Cocaine          300 THC              50   Ethanol     Status: None   Collection Time: 05/07/17 10:00 AM  Result Value Ref Range   Alcohol, Ethyl (B) <10 <10 mg/dL  Comment:        LOWEST DETECTABLE LIMIT FOR SERUM ALCOHOL IS 10 mg/dL FOR MEDICAL PURPOSES ONLY   Urinalysis, Routine w reflex microscopic     Status: Abnormal   Collection Time: 05/07/17 10:35 AM  Result Value Ref Range   Color, Urine STRAW (A) YELLOW   APPearance CLEAR CLEAR   Specific Gravity, Urine 1.006 1.005 - 1.030   pH 7.0 5.0 - 8.0   Glucose, UA NEGATIVE NEGATIVE mg/dL   Hgb urine dipstick NEGATIVE NEGATIVE   Bilirubin Urine NEGATIVE NEGATIVE   Ketones, ur NEGATIVE NEGATIVE mg/dL   Protein, ur NEGATIVE NEGATIVE mg/dL   Nitrite NEGATIVE NEGATIVE   Leukocytes, UA NEGATIVE NEGATIVE  Pregnancy, urine     Status: None   Collection Time: 05/07/17 10:35 AM  Result Value Ref Range   Preg Test, Ur NEGATIVE NEGATIVE    Comment:        THE SENSITIVITY OF THIS METHODOLOGY IS >20 mIU/mL.     Current Facility-Administered Medications  Medication Dose Route Frequency Provider Last Rate Last Dose  . alum & mag hydroxide-simeth (MAALOX/MYLANTA) 200-200-20 MG/5ML suspension 30 mL  30 mL Oral Q6H PRN Kirichenko, Tatyana, PA-C      . ARIPiprazole (ABILIFY) tablet 10 mg  10 mg Oral Daily Patrecia Pour, NP   10 mg at 05/08/17 9518  . hydrOXYzine (ATARAX/VISTARIL) tablet 25 mg  25 mg Oral Q6H PRN Patrecia Pour, NP      . ondansetron [facility]) tablet 4 mg  4 mg Oral Q8H PRN Kirichenko, Tatyana, PA-C      . propranolol (INDERAL) tablet 10 mg  10 mg Oral BID Patrecia Pour, NP      . traZODone (DESYREL) tablet 50 mg  50 mg Oral QHS PRN Patrecia Pour, NP       Current Outpatient Prescriptions  Medication Sig Dispense Refill  . carbamazepine (TEGRETOL XR) 200 MG 12 hr tablet Take 1 tablet (200 mg total) by mouth 2 (two) times daily. For mood stabilization (Patient not  taking: Reported on 05/07/2017) 60 tablet 0  . hydrOXYzine (ATARAX/VISTARIL) 25 MG tablet Take 1 tablet (25 mg total) by mouth every 6 (six) hours as needed for anxiety. (Patient not taking: Reported on 05/07/2017) 60 tablet 0  . OLANZapine zydis (ZYPREXA) 15 MG disintegrating tablet Take 1 tablet (15 mg total) by mouth 2 (two) times daily. (Patient not taking: Reported on 05/07/2017) 60 tablet 0  . propranolol (INDERAL) 10 MG tablet Take 1 tablet (10 mg total) by mouth 2 (two) times daily. For anxiety (Patient not taking: Reported on 04/22/2017) 60 tablet 0  . traZODone (DESYREL) 50 MG tablet Take 1 tablet (50 mg total) by mouth at bedtime as needed for sleep. (Patient not taking: Reported on 05/07/2017) 30 tablet 0    Musculoskeletal: Strength & Muscle Tone: within normal limits Gait & Station: normal Patient leans: N/A  Psychiatric Specialty Exam: Physical Exam  Constitutional: She is oriented to person, place, and time. She appears well-developed.  HENT:  Head: Normocephalic.  Neck: Normal range of motion.  Respiratory: Effort normal.  Musculoskeletal: Normal range of motion.  Neurological: She is alert and oriented to person, place, and time.  Psychiatric: She has a normal mood and affect. Her speech is normal and behavior is normal. Judgment normal. Thought content is delusional. Cognition and memory are normal.    Review of Systems  All other systems reviewed and are negative.   Blood pressure 116/84, pulse 88, temperature  6 F (36.7 C), temperature source Oral, resp. rate 16, height '5\' 3"'  (1.6 m), weight 44.5 kg (98 lb), SpO2 100 %.Body mass index is 17.36 kg/m.  General Appearance: Casual  Eye Contact:  Good  Speech:  Normal Rate  Volume:  Normal  Mood:  Euthymic  Affect:  Congruent  Thought Process:  Coherent and Descriptions of Associations: Intact  Orientation:  Full (Time, Place, and Person)  Thought Content:  Delusions, baseline  Suicidal Thoughts:  No  Homicidal  Thoughts:  No  Memory:  Immediate;   Good Recent;   Good Remote;   Good  Judgement:  Fair  Insight:  Fair  Psychomotor Activity:  Normal  Concentration:  Concentration: Good and Attention Span: Good  Recall:  Good  Fund of Knowledge:  Fair  Language:  Good  Akathisia:  No  Handed:  Right  AIMS (if indicated):     Assets:  Housing Leisure Time Physical Health Resilience Social Support  ADL's:  Intact  Cognition:  WNL  Sleep:        Treatment Plan Summary: Daily contact with patient to assess and evaluate symptoms and progress in treatment, Medication management and Plan bipolar affective disorder, mixed, mild:  -Crisis stabilization -Medication management:  Restarted medical medications but discontinued her Tegretol and Zyprexa as she was recently changed to Abilify.  Abilify 10 mg daily for mood/psychosis started. -Individual counseling  Disposition: No evidence of imminent risk to self or others at present.    Waylan Boga, NP 05/08/2017 10:17 AM  Patient was seen and staffed, agree with notes

## 2017-05-08 NOTE — [facility] Suicide Risk Assessment (Signed)
Suicide Risk Assessment  Discharge Assessment   [facility]   Principal Problem: Bipolar affective disorder, current episode mild [facility](HCC) Discharge Diagnoses:  Patient Active Problem List   Diagnosis Date Noted  . 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 Bipolar affective disorder, current episode mild (HCC) [F31.9] 01/05/2017    Priority: High  . Manic behavior (HCC) [F30.10]   . Facial cellulitis [L03.211] 02/10/2014    Total Time spent with patient: 45 minutes  Musculoskeletal: Strength & Muscle Tone: within normal limits Gait & Station: normal Patient leans: N/A  Psychiatric Specialty Exam: Physical Exam  Constitutional: She is oriented to person, place, and time. She appears well-developed.  HENT:  Head: Normocephalic.  Neck: Normal range of motion.  Respiratory: Effort normal.  Musculoskeletal: Normal range of motion.  Neurological: She is alert and oriented to person, place, and time.  Psychiatric: She has a normal mood and affect. Her speech is normal and behavior is normal. Judgment normal. Thought content is delusional. Cognition and memory are normal.    Review of Systems  All other systems reviewed and are negative.   Blood pressure 116/84, pulse 88, temperature 98 F (36.7 C), temperature source Oral, resp. rate 16, height 5\' 3"  (1.6 m), weight 44.5 kg (98 lb), SpO2 100 %.Body mass index is 17.36 kg/m.  General Appearance: Casual  Eye Contact:  Good  Speech:  Normal Rate  Volume:  Normal  Mood:  Euthymic  Affect:  Congruent  Thought Process:  Coherent and Descriptions of Associations: Intact  Orientation:  Full (Time, Place, and Person)  Thought Content:  Delusions, baseline  Suicidal Thoughts:  No  Homicidal Thoughts:  No  Memory:  Immediate;   Good Recent;   Good Remote;   Good  Judgement:  Fair  Insight:  Fair  Psychomotor Activity:  Normal  Concentration:  Concentration:  Good and Attention Span: Good  Recall:  Good  Fund of Knowledge:  Fair  Language:  Good  Akathisia:  No  Handed:  Right  AIMS (if indicated):     Assets:  Housing Leisure Time Physical Health Resilience Social Support  ADL's:  Intact  Cognition:  WNL  Sleep:       Mental Status Per Nursing Assessment::   On Admission:   Psychosis  Demographic Factors:  Living alone  Loss Factors: NA  Historical Factors: NA  Risk Reduction Factors:   Sense of responsibility to family, Positive social support and Positive therapeutic relationship  Continued Clinical Symptoms:  Delusional, basline  Cognitive Features That Contribute To Risk:  None    Suicide Risk:  Minimal: No identifiable suicidal ideation.  Patients presenting with no risk factors but with morbid ruminations; may be classified as minimal risk based on the severity of the depressive symptoms    Plan Of Care/Follow-up recommendations:  Activity:  as tolerated Diet:  heart healthy diet  LORD, JAMISON, NP 05/08/2017, 10:20 AM

## 2017-05-11 ENCOUNTER — Emergency Department (HOSPITAL_COMMUNITY)
Admission: EM | Admit: 2017-05-11 | Discharge: 2017-06-01 | Disposition: A | Discharge status: Other Acute Inpatient Hospital | Payer: Medicaid Other | Attending: Emergency Medicine | Admitting: Emergency Medicine

## 2017-05-11 ENCOUNTER — Encounter (HOSPITAL_COMMUNITY): Payer: Self-pay

## 2017-05-11 DIAGNOSIS — Z0489 Encounter for examination and observation for other specified reasons: Secondary | ICD-10-CM | POA: Insufficient documentation

## 2017-05-11 DIAGNOSIS — F319 Bipolar disorder, unspecified: Secondary | ICD-10-CM | POA: Diagnosis not present

## 2017-05-11 DIAGNOSIS — D649 Anemia, unspecified: Secondary | ICD-10-CM | POA: Insufficient documentation

## 2017-05-11 DIAGNOSIS — L03211 Cellulitis of face: Secondary | ICD-10-CM | POA: Insufficient documentation

## 2017-05-11 DIAGNOSIS — F3010 Manic episode without psychotic symptoms, unspecified: Secondary | ICD-10-CM | POA: Diagnosis not present

## 2017-05-11 DIAGNOSIS — Z008 Encounter for other general examination: Secondary | ICD-10-CM

## 2017-05-11 DIAGNOSIS — F312 Bipolar disorder, current episode manic severe with psychotic features: Secondary | ICD-10-CM | POA: Diagnosis not present

## 2017-05-11 DIAGNOSIS — R7989 Other specified abnormal findings of blood chemistry: Secondary | ICD-10-CM | POA: Insufficient documentation

## 2017-05-11 DIAGNOSIS — F29 Unspecified psychosis not due to a substance or known physiological condition: Secondary | ICD-10-CM | POA: Insufficient documentation

## 2017-05-11 DIAGNOSIS — Z79899 Other long term (current) drug therapy: Secondary | ICD-10-CM | POA: Diagnosis not present

## 2017-05-11 DIAGNOSIS — Z9119 Patient's noncompliance with other medical treatment and regimen: Secondary | ICD-10-CM | POA: Insufficient documentation

## 2017-05-11 DIAGNOSIS — R4182 Altered mental status, unspecified: Secondary | ICD-10-CM | POA: Diagnosis present

## 2017-05-11 DIAGNOSIS — Z9114 Patient's other noncompliance with medication regimen: Secondary | ICD-10-CM

## 2017-05-11 DIAGNOSIS — R945 Abnormal results of liver function studies: Secondary | ICD-10-CM

## 2017-05-11 LAB — CBC WITH DIFFERENTIAL/PLATELET
BASOS ABS: 0 10*3/uL (ref 0.0–0.1)
BASOS PCT: 1 %
EOS PCT: 1 %
Eosinophils Absolute: 0 10*3/uL (ref 0.0–0.7)
HEMATOCRIT: 30.5 % — AB (ref 36.0–46.0)
Hemoglobin: 9.9 g/dL — ABNORMAL LOW (ref 12.0–15.0)
LYMPHS ABS: 1.1 10*3/uL (ref 0.7–4.0)
Lymphocytes Relative: 25 %
MCH: 27.8 pg (ref 26.0–34.0)
MCHC: 32.5 g/dL (ref 30.0–36.0)
MCV: 85.7 fL (ref 78.0–100.0)
MONOS PCT: 11 %
Monocytes Absolute: 0.5 10*3/uL (ref 0.1–1.0)
NEUTROS ABS: 2.6 10*3/uL (ref 1.7–7.7)
Neutrophils Relative %: 62 %
Platelets: 390 10*3/uL (ref 150–400)
RBC: 3.56 MIL/uL — ABNORMAL LOW (ref 3.87–5.11)
RDW: 14.4 % (ref 11.5–15.5)
WBC: 4.2 10*3/uL (ref 4.0–10.5)

## 2017-05-11 LAB — RAPID URINE DRUG SCREEN, HOSP PERFORMED
Amphetamines: NOT DETECTED
Barbiturates: NOT DETECTED
Benzodiazepines: NOT DETECTED
Cocaine: NOT DETECTED
OPIATES: NOT DETECTED
Tetrahydrocannabinol: NOT DETECTED

## 2017-05-11 LAB — COMPREHENSIVE METABOLIC PANEL
ALK PHOS: 92 U/L (ref 38–126)
ALT: 65 U/L — ABNORMAL HIGH (ref 14–54)
ANION GAP: 8 (ref 5–15)
AST: 50 U/L — ABNORMAL HIGH (ref 15–41)
Albumin: 3.3 g/dL — ABNORMAL LOW (ref 3.5–5.0)
BILIRUBIN TOTAL: 0.6 mg/dL (ref 0.3–1.2)
BUN: 16 mg/dL (ref 6–20)
CALCIUM: 8.7 mg/dL — AB (ref 8.9–10.3)
CO2: 27 mmol/L (ref 22–32)
Chloride: 105 mmol/L (ref 101–111)
Creatinine, Ser: 0.68 mg/dL (ref 0.44–1.00)
Glucose, Bld: 88 mg/dL (ref 65–99)
Potassium: 3.6 mmol/L (ref 3.5–5.1)
Sodium: 140 mmol/L (ref 135–145)
TOTAL PROTEIN: 8.1 g/dL (ref 6.5–8.1)

## 2017-05-11 LAB — ACETAMINOPHEN LEVEL

## 2017-05-11 LAB — I-STAT BETA HCG BLOOD, ED (MC, WL, AP ONLY)

## 2017-05-11 LAB — SALICYLATE LEVEL: Salicylate Lvl: <7 mg/dL (ref 2.8–30.0)

## 2017-05-11 LAB — ETHANOL

## 2017-05-11 MED ORDER — ZOLPIDEM TARTRATE 5 MG PO TABS: 5.0000 mg | ORAL_TABLET | Freq: Every evening | ORAL | Status: DC | PRN

## 2017-05-11 MED ORDER — ALUM & MAG HYDROXIDE-SIMETH 200-200-20 MG/5ML PO SUSP: 30.0000 mL | Freq: Four times a day (QID) | ORAL | Status: DC | PRN

## 2017-05-11 MED ORDER — LORAZEPAM 1 MG PO TABS
1.0000 mg | ORAL_TABLET | ORAL | Status: AC | PRN
  Administered 2017-05-12: 1 mg via ORAL
  Filled 2017-05-11: qty 1

## 2017-05-11 MED ORDER — ONDANSETRON HCL 4 MG PO TABS: 4.0000 mg | ORAL_TABLET | Freq: Three times a day (TID) | ORAL | Status: DC | PRN

## 2017-05-11 MED ORDER — HYDROXYZINE HCL 25 MG PO TABS
25.0000 mg | ORAL_TABLET | Freq: Four times a day (QID) | ORAL | Status: DC | PRN
  Administered 2017-05-13 – 2017-05-22 (×9): 25 mg via ORAL
  Filled 2017-05-11 (×10): qty 1

## 2017-05-11 MED ORDER — ZIPRASIDONE MESYLATE 20 MG IM SOLR
20.0000 mg | INTRAMUSCULAR | Status: AC | PRN
  Administered 2017-05-11: 20 mg via INTRAMUSCULAR
  Filled 2017-05-11: qty 20

## 2017-05-11 MED ORDER — ARIPIPRAZOLE 10 MG PO TABS
10.0000 mg | ORAL_TABLET | Freq: Every day | ORAL | Status: DC
  Administered 2017-05-11 – 2017-05-21 (×11): 10 mg via ORAL
  Filled 2017-05-11 (×12): qty 1

## 2017-05-11 MED ORDER — NICOTINE 21 MG/24HR TD PT24: 21.0000 mg | MEDICATED_PATCH | Freq: Every day | TRANSDERMAL | Status: DC | PRN

## 2017-05-11 MED ORDER — TRAZODONE HCL 50 MG PO TABS
50.0000 mg | ORAL_TABLET | Freq: Every evening | ORAL | Status: DC | PRN
  Administered 2017-05-14 – 2017-05-16 (×2): 50 mg via ORAL
  Filled 2017-05-11 (×6): qty 1

## 2017-05-11 MED ORDER — RISPERIDONE 2 MG PO TBDP
2.0000 mg | ORAL_TABLET | Freq: Three times a day (TID) | ORAL | Status: DC | PRN
  Administered 2017-05-12 – 2017-05-14 (×3): 2 mg via ORAL
  Filled 2017-05-11 (×5): qty 1

## 2017-05-11 MED ORDER — IBUPROFEN 400 MG PO TABS
600.0000 mg | ORAL_TABLET | Freq: Three times a day (TID) | ORAL | Status: DC | PRN
  Administered 2017-05-15: 600 mg via ORAL
  Filled 2017-05-11 (×2): qty 1

## 2017-05-11 NOTE — ED Provider Notes (Signed)
[facility] EMERGENCY DEPARTMENT Provider Note   CSN: [id number] Arrival date & time:        History   Chief Complaint Chief Complaint  Patient presents with  . Altered Mental Status    HPI Yvonne [name] is a 51 y.o. female with a PMHx of bipolar 1 disorder, manic behavior, and psychosis, who presents to the ED via EMS for evaluation of psychosis. LEVEL 5 CAVEAT DUE TO PSYCHOSIS/PSYCHIATRIC CONDITION, HISTORY THEREFORE LIMITED. Per EMS, patient was picked up at the location where she has recently been evicted, she was refusing to leave. She was very disruptive and acutely psychotic. She is having flight of ideas and is very tangential and illogical thought process, stating that she is "pregnant with 9000 twins which they will then eat and then smoke weed with Gaetano Hawthorne". Upon arrival, she is stating that she is my mother and knows who I am, and that her son died today "on halloween" and that he's with her in the chair, and that her sister also died, and that she told [name] she needed her medicine, but that she's fine. She denies drug use aside from her Abilify 10mg  daily that she last took yesterday, although she's a very unreliable historian so it's hard to say whether this is accurate information. She states that she sometimes drinks "a corona or wine" but that she "didn't get to it today because her son died". She doesn't divulge when her last EtOH consumption was. denies Tobacco use. Doesn't really answer whether she's having SI/HI/AVH, but then later states "nothing's wrong with me except for losing my son and now my sister died, it's showing it on TV, you just missed it". She denies being on any other psych medications at this time. She denies any other complaints at this time, however due to her acute psychotic state, history and ROS are severely limited.   Of note, per chart review, pt was seen at [facility] 05/07/17 for similar complaints/issue, had TTS evaluation  who kept her overnight and the next day [facility] determined that she was low risk and without any identifiable SI so she was released with outpatient f/up recommendations. Prior to that visit, she was seen on 04/22/17 and kept in the behavioral area of [facility] until 05/02/17 when she was discharged. She has been seen 8 times times over the last 5 months for psychiatric conditions/complaints, including one admission to [facility] in 12/2016.    The history is provided by the patient, medical records and the EMS personnel. The history is limited by the condition of the patient. No language interpreter was used.  Mental Health Problem  Presenting symptoms: bizarre behavior and disorganized thought process   Presenting symptoms: no hallucinations, no homicidal ideas and no suicidal thoughts   Patient accompanied by: EMS. Degree of incapacity (severity):  Severe Onset quality:  Unable to specify Duration:  5 months Timing:  Intermittent Progression:  Waxing and waning Chronicity:  Chronic Context: noncompliance and stressful life event   Treatment compliance:  Some of the time Time since last psychoactive medication taken:  1 day Relieved by:  None tried Worsened by:  Nothing Ineffective treatments:  None tried Risk factors: hx of mental illness and recent psychiatric admission     Past Medical History:  Diagnosis Date  . Bipolar 1 disorder (HCC)   . Medical history non-contributory   . Mental disorder   . No pertinent past medical history     Patient Active Problem List  Diagnosis Date Noted  . 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 Bipolar affective disorder, current episode mild (HCC) 01/05/2017  . Manic behavior (HCC)   . Facial cellulitis 02/10/2014    Past Surgical History:  Procedure Laterality Date  . NO PAST SURGERIES      OB History    No data available       Home Medications    Prior to Admission medications   Medication Sig Start Date End Date Taking?  Authorizing Provider  ARIPiprazole (ABILIFY) 10 MG tablet Take 1 tablet (10 mg total) by mouth daily. 05/09/17   Charm Rings, NP  carbamazepine (TEGRETOL XR) 200 MG 12 hr tablet Take 1 tablet (200 mg total) by mouth 2 (two) times daily. For mood stabilization Patient not taking: Reported on 05/07/2017 05/02/17   Charm Rings, NP  hydrOXYzine (ATARAX/VISTARIL) 25 MG tablet Take 1 tablet (25 mg total) by mouth every 6 (six) hours as needed for anxiety. Patient not taking: Reported on 05/07/2017 05/02/17   Charm Rings, NP  OLANZapine zydis (ZYPREXA) 15 MG disintegrating tablet Take 1 tablet (15 mg total) by mouth 2 (two) times daily. Patient not taking: Reported on 05/07/2017 05/02/17   Charm Rings, NP  propranolol (INDERAL) 10 MG tablet Take 1 tablet (10 mg total) by mouth 2 (two) times daily. For anxiety Patient not taking: Reported on 04/22/2017 01/14/17   Armandina Stammer I, NP  traZODone (DESYREL) 50 MG tablet Take 1 tablet (50 mg total) by mouth at bedtime as needed for sleep. Patient not taking: Reported on 05/07/2017 05/02/17   Charm Rings, NP    Family History Family History  Problem Relation Age of Onset  . Depression Sister   . Depression Brother   . Depression Sister   . CAD Mother   . Hypertension Father   . Diabetes Father   . Bipolar disorder Cousin     Social History Social History  Substance Use Topics  . Smoking status: Never Smoker  . Smokeless tobacco: Never Used  . Alcohol use No     Allergies   Patient has no known allergies.   Review of Systems Review of Systems  Unable to perform ROS: Psychiatric disorder  Psychiatric/Behavioral: Positive for behavioral problems. Negative for hallucinations, homicidal ideas and suicidal ideas.   Level 5 caveat due to psychosis/psychiatric condition  Physical Exam Updated Vital Signs BP 112/87 (BP Location: Right Arm)   Pulse (!) 112   Temp 98.2 F (36.8 C)   Resp (!) 28   Ht 5\' 3"  (1.6 m)   Wt  44.5 kg (98 lb)   SpO2 100%   BMI 17.36 kg/m   Physical Exam  Constitutional: She is oriented to person, place, and time. Vital signs are normal. She appears well-developed and well-nourished.  Non-toxic appearance. No distress.  Afebrile, nontoxic, non-stop flight of ideas and illogical thought process, doesn't stop talking once throughout entire evaluation, appears acutely psychotic but in no distress  HENT:  Head: Normocephalic and atraumatic.  Mouth/Throat: Oropharynx is clear and moist and mucous membranes are normal.  Eyes: Conjunctivae and EOM are normal. Right eye exhibits no discharge. Left eye exhibits no discharge.  Neck: Normal range of motion. Neck supple.  Cardiovascular: Regular rhythm, normal heart sounds and intact distal pulses.  Tachycardia present.  Exam reveals no gallop and no friction rub.   No murmur heard. Tachycardic in the 110s likely due to not stopping talking throughout entire evaluation;  once she stops talking for a brief period of time, her HR comes down to the upper 90s; appears consistent with prior evaluations. Reg rhythm, nl s1/s2, no m/r/g, distal pulses intact, no pedal edema   Pulmonary/Chest: Effort normal and breath sounds normal. No respiratory distress. She has no decreased breath sounds. She has no wheezes. She has no rhonchi. She has no rales.  Abdominal: Soft. Normal appearance and bowel sounds are normal. She exhibits no distension. There is no tenderness. There is no rigidity, no rebound, no guarding, no CVA tenderness, no tenderness at McBurney's point and negative Murphy's sign.  Musculoskeletal: Normal range of motion.  MAE x4 Strength and sensation grossly intact in all extremities Distal pulses intact  Neurological: She is alert and oriented to person, place, and time. She has normal strength. No sensory deficit.  Alert and oriented to person, place, and time, however tangential illogical thought process with flight of ideas No focal neuro  deficits appreciated  Skin: Skin is warm, dry and intact. No rash noted.  Psychiatric: Her mood appears anxious. Her speech is rapid and/or pressured and tangential. She is hyperactive and actively hallucinating. Thought content is delusional. She expresses no homicidal and no suicidal ideation. She expresses no suicidal plans and no homicidal plans.  Flight of ideas and tangential illogical thought process, doesn't stop talking throughout entire exam, states that her son died and is at bedside, so seems to be potentially responding to visual hallucination although hard to tell. Delusional thought content at times. Somewhat anxious appearing, very rapid and pressured speech, hyperactive and can't stop talking. Denies SI/HI/AVH however hard to say if she's being truthful/accurate because she just continues talking about various different thoughts that she has and doesn't really answer the questions fully.   Nursing note and vitals reviewed.    ED Treatments / Results  Labs (all labs ordered are listed, but only abnormal results are displayed) Labs Reviewed  CBC WITH DIFFERENTIAL/PLATELET - Abnormal; Notable for the following:       Result Value   RBC 3.56 (*)    Hemoglobin 9.9 (*)    HCT 30.5 (*)    All other components within normal limits  COMPREHENSIVE METABOLIC PANEL - Abnormal; Notable for the following:    Calcium 8.7 (*)    Albumin 3.3 (*)    AST 50 (*)    ALT 65 (*)    All other components within normal limits  ACETAMINOPHEN LEVEL - Abnormal; Notable for the following:    Acetaminophen (Tylenol), Serum <10 (*)    All other components within normal limits  ETHANOL  SALICYLATE LEVEL  RAPID URINE DRUG SCREEN, HOSP PERFORMED  I-STAT BETA HCG BLOOD, ED ([facility], [facility], [facility] ONLY)    EKG  EKG Interpretation None       Radiology No results found.  Procedures Procedures (including critical care time)  Medications Ordered in ED Medications  risperiDONE (RISPERDAL M-TABS)  disintegrating tablet 2 mg (not administered)    And  LORazepam (ATIVAN) tablet 1 mg (not administered)    And  ziprasidone (GEODON) injection 20 mg (not administered)  ibuprofen (ADVIL,MOTRIN) tablet 600 mg (not administered)  ondansetron (ZOFRAN) tablet 4 mg (not administered)  alum & mag hydroxide-simeth (MAALOX/MYLANTA) 200-200-20 MG/5ML suspension 30 mL (not administered)  nicotine (NICODERM CQ - dosed in mg/24 hours) patch 21 mg (not administered)  ARIPiprazole (ABILIFY) tablet 10 mg (not administered)  hydrOXYzine (ATARAX/VISTARIL) tablet 25 mg (not administered)  traZODone (DESYREL) tablet 50 mg (not administered)  Initial Impression / Assessment and Plan / ED Course  I have reviewed the triage vital signs and the nursing notes.  Pertinent labs & imaging results that were available during my care of the patient were reviewed by me and considered in my medical decision making (see chart for details).     51 y.o. female here in acute psychosis, apparently this has been a recurring issue since May 2018 and she's had 8 ED visits since then including one on 05/07/17 (cleared by psych the next day), and [facility]. She arrives today acutely psychotic, having flight of ideas talking about her family members who died today, and having a very illogical thought process. Doesn't really answer questions, just continues to state that she's my mother and her son and sister died and that she told Dr. Jannifer FranklinAkintayo that she needs her medicines. She won't really answer whether she's having SI/HI/AVH, but she seems to be responding to a visual hallucination at bedside stating that she sees her son in the chair. States she occasionally drinks alcohol but won't divulge if she drank any recently. Denies drug use and tobacco use. She denies any other complaints, however history/ROS limited due to psychotic state. Oriented to person/place/time, doesn't appear confused, just psychotic; mildly  tachycardic but likely from the fact that she won't stop talking or sit still throughout the entire evaluation. Will get psych clearance labs and reassess after.   7:07 PM CBC w/diff with chronic stable anemia. CMP with marginally elevated AST/ALT 50/65 respectively, but otherwise remainder of CMP WNL. EtOH level undetectable. Salicylate and acetaminophen levels WNL. BetaHCG neg. UDS negative. Pt medically cleared at this time. Psych hold orders and some of her home med orders placed (ordered abilify, trazodone, and vistaril, but didn't reorder the others since she denied being on anything other than abilify; will let psych determine if she needs those restarted). Please see TTS notes for further documentation of care/dispo. PLEASE NOTE THAT PT IS HERE VOLUNTARILY AT THIS TIME, IF PT TRIES TO LEAVE THEY WOULD NEED IVC PAPERWORK TAKEN OUT. Pt stable at time of med clearance.     Final Clinical Impressions(s) / ED Diagnoses   Final diagnoses:  Psychosis, unspecified psychosis type [facility](HCC)  Medical clearance for psychiatric admission  Noncompliance with medication regimen  Elevated LFTs  Chronic anemia    New Prescriptions New Prescriptions   No medications on [id number]     Renise Gillies, [place] Lot[place]anaMercedes, [place]PA-C 05/11/17 Windell [id number]    Rolland PorterJames, Mark, MD 05/20/17 [phone]

## 2017-05-11 NOTE — ED Triage Notes (Signed)
Pt arrives EMS from location where she had been evicted and was trying to get back into house. EMS called by PD. Pt speaking with preassured speech and illogical non linear thought process. States EMS is pregnant and is having 9000 twins which they will then eat and then smoke weed with Gaetano HawthorneFreddy Kreuger.

## 2017-05-11 NOTE — ED Notes (Signed)
Reg tray ordered 

## 2017-05-11 NOTE — ED Notes (Signed)
Pt eating supper and tolerating well. Pt continues to talk constantly through meal.

## 2017-05-11 NOTE — BH Assessment (Addendum)
Tele Assessment Note   Patient Name: Yvonne [name] MRN: [id number] Referring Physician: Dr. Rolland PorterMark James, MD [facility] Location of Provider: Behavioral Health TTS Department  Yvonne [name] is an 51 y.o. female who presents to the ED via EMS for evaluation of psychosis.  Per EMS, patient was picked up at the location where she has recently been evicted, she was refusing to leave. She was very disruptive and acutely psychotic. LPCA was unable to complete the assessment due to pt's psychotic behavior. Pt was yelling at the monitor stating "my son is dead. I just buried 9,000 people the nation is gone.  I told my husband O and can't take it he just died.  See? You see him?"  LPCA was not able to complete the assessment with the patient due to psychosis but use the information from previous assessments.   Patient was wearing hospital scrubs and appeared appropriately groomed.  Pt was  Psychotic and responding to internal stimuli throughout the assessment.  Patient made fair eye contact and had abnormal psychomotor activity.  Patient spoke in a loud voice without pressured speech.      Disposition: LPCA discussed case [facility] provider, Donell SievertSpencer Simon, PA who recommends pt for inpatient treatment.  According to [facility], Fransico MichaelKim Brooks, RN there are not suitable female beds for [facility]net [facility].  LPCA informed pt's ER provider, Dr. Massie BougieFifer, MD and pt's nurse, Perpetue, RN of PA's recommendation.  TTS will search for placement.   Diagnosis: Bipolar schizoaffective with psychosis  Past Medical History:  Past Medical History:  Diagnosis Date  . Bipolar 1 disorder (HCC)   . Medical history non-contributory   . Mental disorder   . No pertinent past medical history     Past Surgical History:  Procedure Laterality Date  . NO PAST SURGERIES      Family History:  Family History  Problem Relation Age of Onset  . Depression Sister   . Depression Brother   .  Depression Sister   . CAD Mother   . Hypertension Father   . Diabetes Father   . Bipolar disorder Cousin     Social History:  reports that she has never smoked. She has never used smokeless tobacco. She reports that she does not drink alcohol or use drugs.  Additional Social History:  Alcohol / Drug Use Pain Medications: See MARs Prescriptions: See MARs Over the Counter: See MARs History of alcohol / drug use?:  (UTA )  CIWA: CIWA-Ar BP: 108/64 Pulse Rate: (!) 103 COWS:    PATIENT STRENGTHS: (choose at least two) Physical Health  Allergies: No Known Allergies  Home Medications:  (Not in a hospital admission)  OB/GYN Status:  No LMP recorded. Patient is not currently having periods (Reason: Perimenopausal).  General Assessment Data Assessment unable to be completed: Yes Reason for not completing assessment: Pt was not able answer question due to pyschosis (pt responding to internal stimuli) [facility] Assessment: In system Is this a Tele or Face-to-Face Assessment?: Tele Assessment Is this an Initial Assessment or a Re-assessment for this encounter?: Initial Assessment Marital status:  (Unable to assess) Juanell FairlyMaiden name: unknown (UTA) Is patient pregnant?: Unknown (UTA pt responding to internal stimuli) Pregnancy Status: Unknown (UTA pt responding to internal stimuli) Living Arrangements:  (UTA pt responding to internal stimuli) Can pt return to current living arrangement?: No (per pt's records pt was evicted from her home) Admission Status: Voluntary Is patient capable of  signing voluntary admission?:  (Pt is currently psychotic and responding to internal stimuli) Referral Source: Other (Pt was brought in by EMS after being called by [employer])     Crisis Care Plan Living Arrangements:  (UTA pt responding to internal stimuli) Legal Guardian: Other: (UTA pt responding to internal stimuli) Name of Psychiatrist: UTA pt responding to internal stimuli (UTA  pt responding to internal stimuli) Name of Therapist: UTA pt responding to internal stimuli  Education Status Is patient currently in school?: No (UTA pt responding to internal stimuli) Current Grade: UTA pt responding to internal stimuli Highest grade of school patient has completed: UTA pt responding to internal stimuli  Risk to self with the past 6 months Suicidal Ideation:  (UTA pt responding to internal stimuli) Has patient been a risk to self within the past 6 months prior to admission? :  (UTA pt responding to internal stimuli) Suicidal Intent:  (UTA pt responding to internal stimuli) Has patient had any suicidal intent within the past 6 months prior to admission? :  (UTA pt responding to internal stimuli) Is patient at risk for suicide?:  (UTA pt responding to internal stimuli) Suicidal Plan?:  (UTA pt responding to internal stimuli) Has patient had any suicidal plan within the past 6 months prior to admission? :  (UTA pt responding to internal stimuli) Access to Means:  (UTA pt responding to internal stimuli) What has been your use of drugs/alcohol within the last 12 months?: UTA pt responding to internal stimuli Previous Attempts/Gestures:  (UTA pt responding to internal stimuli) How many times?:  (UTA pt responding to internal stimuli) Other Self Harm Risks:  (UTA pt responding to internal stimuli) Triggers for Past Attempts:  (UTA pt responding to internal stimuli) Intentional Self Injurious Behavior:  (UTA pt responding to internal stimuli) Family Suicide History: Unable to assess (UTA pt responding to internal stimuli) Recent stressful life event(s):  (UTA pt responding to internal stimuli) Persecutory voices/beliefs?:  (UTA pt responding to internal stimuli) Depression:  (UTA pt responding to internal stimuli) Depression Symptoms:  (UTA pt responding to internal stimuli) Substance abuse history and/or treatment for substance abuse?:  (UTA pt responding to internal  stimuli) Suicide prevention information given to non-admitted patients:  (UTA pt responding to internal stimuli)  Risk to Others within the past 6 months Homicidal Ideation:  (UTA pt responding to internal stimuli) Does patient have any lifetime risk of violence toward others beyond the six months prior to admission? : Unknown (UTA pt responding to internal stimuli) Thoughts of Harm to Others:  (UTA pt responding to internal stimuli) Current Homicidal Intent:  (UTA pt responding to internal stimuli) Current Homicidal Plan:  (UTA pt responding to internal stimuli) Access to Homicidal Means:  (UTA pt responding to internal stimuli) Identified Victim:  (UTA pt responding to internal stimuli) History of harm to others?:  (UTA pt responding to internal stimuli) Assessment of Violence:  (UTA pt responding to internal stimuli) Violent Behavior Description: UTA pt responding to internal stimuli Does patient have access to weapons?:  (UTA pt responding to internal stimuli) Criminal Charges Pending?:  (UTA pt responding to internal stimuli) Does patient have a court date:  (UTA pt responding to internal stimuli) Court Date:  (UTA pt responding to internal stimuli) Is patient on probation?: Unknown (UTA pt responding to internal stimuli)  Psychosis Hallucinations: Visual, Auditory (Pt was behaving as she was responding to internal stimuli) Delusions: Unspecified (UTA pt responding to internal stimuli)  Mental Status Report Appearance/Hygiene: Unable to  Assess (UTA pt responding to internal stimuli) Eye Contact: Unable to Assess (UTA pt responding to internal stimuli) Motor Activity: Agitation, Hyperactivity, Restlessness, Unable to assess (UTA pt responding to internal stimuli) Speech: Incoherent, Rapid, Pressured, Loud, Tangential, Word salad, Aggressive (Pt responding to internal stimuli) Level of Consciousness: Alert, Combative, Restless, Irritable, Unable to assess (UTA pt responding to  internal stimuli) Mood: Anxious, Angry, Ambivalent, Irritable (Pt responding to internal stimuli) Affect: Anxious, Angry, Irritable (Pt responding to internal stimuli) Anxiety Level: Severe (UTA pt responding to internal stimuli) Thought Processes: Unable to Assess (UTA pt responding to internal stimuli) Judgement: Unable to Assess (UTA pt responding to internal stimuli) Orientation: Unable to assess (UTA pt responding to internal stimuli) Obsessive Compulsive Thoughts/Behaviors: Unable to Assess (UTA pt responding to internal stimuli)  Cognitive Functioning Concentration: Unable to Assess (UTA pt responding to internal stimuli) Memory: Unable to Assess (UTA pt responding to internal stimuli) IQ:  (UTA pt responding to internal stimuli) Insight: Unable to Assess (UTA pt responding to internal stimuli) Impulse Control: Unable to Assess (UTA pt responding to internal stimuli) Appetite:  (UTA pt responding to internal stimuli) Total Hours of Sleep:  (UTA pt responding to internal stimuli) Vegetative Symptoms: Unable to Assess (UTA pt responding to internal stimuli)  ADLScreening [facility] Assessment Services) Patient's cognitive ability adequate to safely complete daily activities?:  (UTA pt responding to internal stimuli) Patient able to express need for assistance with ADLs?:  (UTA pt responding to internal stimuli) Independently performs ADLs?:  (UTA pt responding to internal stimuli)  Prior Inpatient Therapy Prior Inpatient Therapy: Yes Prior Therapy Dates: 12/2016 Prior Therapy Facilty/Provider(s): [facility] Reason for Treatment: psychosis  Prior Outpatient Therapy Prior Outpatient Therapy:  (UTA pt responding to internal stimuli) Prior Therapy Dates: UTA (UTA pt responding to internal stimuli) Prior Therapy Facilty/Provider(s): UTA (UTA pt responding to internal stimuli) Reason for Treatment: UTA (UTA pt responding to internal stimuli) Does patient have an ACCT team?: Unknown (UTA pt  responding to internal stimuli) Does patient have Intensive In-House Services?  : Unknown (UTA pt responding to internal stimuli) Does patient have [facility] services? : Unknown (UTA pt responding to internal stimuli) Does patient have P4CC services?: Unknown (UTA pt responding to internal stimuli)  ADL Screening (condition at time of admission) Patient's cognitive ability adequate to safely complete daily activities?:  (UTA pt responding to internal stimuli) Patient able to express need for assistance with ADLs?:  (UTA pt responding to internal stimuli) Independently performs ADLs?:  (UTA pt responding to internal stimuli)             Advance Directives (For Healthcare) Does Patient Have a Medical Advance Directive?: No Would patient like information on creating a medical advance directive?: No - Patient declined    Additional Information 1:1 In Past 12 Months?:  (UTA pt responding to internal stimuli) CIRT Risk:  (UTA pt responding to internal stimuli) Elopement Risk:  (UTA pt responding to internal stimuli) Does patient have medical clearance?: Yes     Disposition: LPCA discussed case with [facility] provider, Donell Sievert, PA who recommends pt for inpatient treatment.  According to [facility], Fransico Michael, RN there are not suitable female beds for patnet at [facility].  LPCA informed pt's ER provider, Dr. Massie Bougie, MD and pt's nurse, Perpetue, RN of PA's recommendation.  TTS will search for placement.  Disposition Initial Assessment Completed for this Encounter: Yes (UTA pt responding to internal stimuli) Disposition of Patient: Inpatient treatment program Type of inpatient treatment program: Adult  This  service was provided via telemedicine using a 2-way, interactive audio and Immunologist.  Names of all persons participating in this telemedicine service and their role in this encounter.               Deondre Marinaro L Hassell Patras, MS, LPCA, NCC 05/11/2017 9:59 PM

## 2017-05-11 NOTE — BH Assessment (Signed)
  BH ASSESSMENT  LPCA contacted pt's nurse to complete TTS assessment.  Nurse in the middle of direct care and will have the cart in the pt's room in 30 minutes.  TTS will follow up accordingly.  Huie Ghuman L. Floyce Bujak, MS, LPCA, [facility] Therapeutic Triage Specialist  [phone]

## 2017-05-11 NOTE — ED Notes (Signed)
Pt screaming, agitated, and swinging arms around after TTS. Attempted to give pt PO meds, but pt insistent on talking about dead brother/child, crying, yelling, etc. Security @ bedside.

## 2017-05-12 LAB — CBC WITH DIFFERENTIAL/PLATELET
Basophils Absolute: 0 10*3/uL (ref 0.0–0.1)
Basophils Relative: 1 %
EOS PCT: 1 %
Eosinophils Absolute: 0 10*3/uL (ref 0.0–0.7)
HEMATOCRIT: 28.1 % — AB (ref 36.0–46.0)
HEMOGLOBIN: 9 g/dL — AB (ref 12.0–15.0)
LYMPHS ABS: 1 10*3/uL (ref 0.7–4.0)
LYMPHS PCT: 30 %
MCH: 27.6 pg (ref 26.0–34.0)
MCHC: 32 g/dL (ref 30.0–36.0)
MCV: 86.2 fL (ref 78.0–100.0)
MONOS PCT: 13 %
Monocytes Absolute: 0.4 10*3/uL (ref 0.1–1.0)
Neutro Abs: 2 10*3/uL (ref 1.7–7.7)
Neutrophils Relative %: 55 %
Platelets: 291 10*3/uL (ref 150–400)
RBC: 3.26 MIL/uL — AB (ref 3.87–5.11)
RDW: 14.8 % (ref 11.5–15.5)
WBC: 3.4 10*3/uL — AB (ref 4.0–10.5)

## 2017-05-12 LAB — COMPREHENSIVE METABOLIC PANEL
ALK PHOS: 74 U/L (ref 38–126)
ALT: 42 U/L (ref 14–54)
ANION GAP: 4 — AB (ref 5–15)
AST: 30 U/L (ref 15–41)
Albumin: 2.7 g/dL — ABNORMAL LOW (ref 3.5–5.0)
BUN: 16 mg/dL (ref 6–20)
CALCIUM: 8.4 mg/dL — AB (ref 8.9–10.3)
CO2: 25 mmol/L (ref 22–32)
Chloride: 110 mmol/L (ref 101–111)
Creatinine, Ser: 0.79 mg/dL (ref 0.44–1.00)
GFR calc non Af Amer: >60 mL/min (ref >60)
Glucose, Bld: 90 mg/dL (ref 65–99)
Potassium: 3.8 mmol/L (ref 3.5–5.1)
SODIUM: 139 mmol/L (ref 135–145)
Total Bilirubin: 0.5 mg/dL (ref 0.3–1.2)
Total Protein: 6.9 g/dL (ref 6.5–8.1)

## 2017-05-12 NOTE — Progress Notes (Signed)
[facility] requesting an EKG be completed to complete review.   Baldo DaubJolan Anhar Mcdermott MSW, LCSWA CSW Disposition [phone]

## 2017-05-12 NOTE — Progress Notes (Signed)
CSW requests new CBC for patient per request of [facility].  Second request (earlier requested at 5:03PM)  Carney BernJean T. [name], MSW, LCSWA Disposition Clinical Social Work [phone] (cell) [phone] (office)

## 2017-05-12 NOTE — ED Notes (Signed)
Regular Diet ordered for Lunch. 

## 2017-05-12 NOTE — ED Notes (Signed)
Patient was given Sprite and Cookies for snack and A Regular Diet ordered for Lunch.

## 2017-05-12 NOTE — ED Notes (Signed)
Patient was given Grape and Strawberry Jelly.

## 2017-05-12 NOTE — Progress Notes (Signed)
Update on the patient's referrals for inpatient treatment  DECLINED: [facility] - due to aggressive behaviors [facility] - due to aggressive behaviors  [facility] - due to not meeting voluntary criteria   CSW advised to initiate a referral to [facility] for possible placement due to the patient being declined at multiple facilities, and history of aggressive behaviors.  Disposition CSW/TTS will continue to seek placement.    Baldo DaubJolan Elad Macphail MSW, LCSWA CSW Disposition [phone]

## 2017-05-12 NOTE — ED Notes (Signed)
Pt talking constantly, multiple flight of ideas,.. She had 98 babies last night, her husband is on the way, calling all moon pies, pt eating breakfast but talking constanly

## 2017-05-12 NOTE — ED Notes (Signed)
Patient refused a snack. 

## 2017-05-12 NOTE — ED Notes (Signed)
Regular Diet Ordered for Dinner. 

## 2017-05-12 NOTE — Progress Notes (Signed)
Disposition CSW faxed referral to [facility] for the patient to be placed on their wait list.   CSW spoke with Siri ColeHerbert at [facility],and provided verbal demographics.  [facility] referral is complete, per Siri ColeHerbert the reviewing RN will call CSW with a decision.   Disposition CSW awaiting confirmation from [facility],  that the patient has been placed on [facility]. Will continue to follow.    Disposition CSW/TTS will continue to seek placement.   Baldo DaubJolan Margaux Engen MSW, LCSWA CSW Disposition [phone]

## 2017-05-12 NOTE — ED Notes (Signed)
Patient is becoming anxious that food is not here yet.  Heart rate elevated due to increased level of anxiety.

## 2017-05-12 NOTE — ED Notes (Signed)
Pt continues to talk constantly, with continuous flight of ideas but is not agitated and is co operative, up to BR

## 2017-05-12 NOTE — ED Notes (Signed)
Pt having flight of ideas, talking about multiple topics at one time that do not make sense. Pt states she does not think she should still be here in the hospital because she has things to do and hopes she is out by the weekend. Pt ambulated to restroom with sitter and back to her bed without issue.

## 2017-05-12 NOTE — ED Notes (Signed)
Pt refused Advil.  St's "that's too strong for me"

## 2017-05-12 NOTE — ED Notes (Signed)
Pt continues to ramble with flight of ideas, pt is redirectable at this time, remains on stretcher.

## 2017-05-12 NOTE — BHH Counselor (Signed)
TTS reassessment: Pt continues to be tangential with flight of ideas, grandious delusions and word salad. She states to this Clinical research associatewriter: "my husband died last night, yall are twins and I'm the momma." "Kanye picked the right door, #3 but he didn't know it". "I just birthed 91,000 babies last night and that's why I can't get up". "Merry Christmas, it's my birthday and it's snowing, everything is Halloween, if I didn't open the door and tel you about the poodles you're going to ride the spider monkey". Pt does not give logical answers to questions asked and continues with tangential speech. [facility].   Kateri PlummerKristin Najib Colmenares, WauseonLCAS, [facility] Lead Triage Specialist  [facility]  Therapeutic Triage Services Phone: [phone] Fax: [phone]

## 2017-05-12 NOTE — ED Notes (Signed)
Patient ambulatory to the restroom.

## 2017-05-12 NOTE — ED Notes (Signed)
Pt requesting some advil for pain in hip.

## 2017-05-12 NOTE — ED Notes (Signed)
Pt better  Still talking but not as loud , pt up to br again ,

## 2017-05-12 NOTE — ED Notes (Signed)
Breakfast ordered 

## 2017-05-12 NOTE — Progress Notes (Signed)
CSW spoke with Yvonne [name], who confirms that they are requesting a new CBC and the final results of patient's EKG.    CSW contacted pt's ED Nurse, [name], and requested new blood work.  [name] [name], MSW, LCSWA Disposition Clinical Social Work [phone] (cell) [phone] (office)

## 2017-05-12 NOTE — BHH Counselor (Signed)
Referrals for inpatient treatment faxed to:  [facility], [facility], [facility], [facility], [address] [facility], [facility], [facility], [facility], [facility], [facility] [address] [facility], [facility], [facility], [facility], [facility], [address], [facility], [facility], [facility], [facility], [facility], [facility], [facility], [address] [facility], [facility], [facility], [facility], [facility], [facility], [facility].

## 2017-05-13 NOTE — Progress Notes (Signed)
Disposition CSW contacted [facility] to determine whether or not the patient was on their wait list for inpatient treatment. CSW faxed updated CBC and [facility]KG results [facility] this morning.   Per Vonna KotykJay, the patient is still being reviewed. He stated the Medical Director was reviewing the patient's information.   Vonna KotykJay requested CSW to call back in 30 minutes for an update.   CSW will continue to follow.   Baldo DaubJolan Marisha Renier MSW, LCSWA CSW Disposition [phone]

## 2017-05-13 NOTE — ED Notes (Signed)
Regular Diet ordered for Dinner. 

## 2017-05-13 NOTE — ED Notes (Signed)
Patient was given cookies and Sprite. 

## 2017-05-13 NOTE — ED Notes (Signed)
Patient has Sprite and Cookies.

## 2017-05-13 NOTE — ED Notes (Signed)
Pt with flight of ideas for the last 4 hours, calling people names, agitating staff. Pt medicated with PRN risperidone and atarax.

## 2017-05-13 NOTE — ED Notes (Signed)
Patient was given a cup of sprite.

## 2017-05-13 NOTE — BHH Counselor (Signed)
TTS reassessment:   Patient continues to present with flight of ideas, grandious delusions, and word salad. Patient states to writer repeatedly she's not crazy and she knows what she talking about. Patient stated the following, "Drexel IhaOprah Winfrey is the president, Garnet KoyanagiDonald Trump is her father, she has 7 twins, she slept on the porch last night, her cousin was killed by a car driving [phone] miles per hour, she had 600 cousins get killed in day and she brought  Her mother back from the died. Patient does not present with logical thinking.   [facility].

## 2017-05-13 NOTE — ED Notes (Signed)
Regular Diet was ordered for Lunch. 

## 2017-05-14 NOTE — ED Notes (Signed)
Pt continuously talking - delusional - and shaking cup of ice while sitting in bed.

## 2017-05-14 NOTE — ED Notes (Signed)
Pt walking barefoot to bathroom - refuses to put on socks.

## 2017-05-14 NOTE — ED Notes (Addendum)
Pt slept for brief time - woke - talked to self - delusional. Then returned to sleeping.

## 2017-05-14 NOTE — ED Notes (Signed)
Pt ambulatory to the restroom, talking with word salad the whole way there and back.

## 2017-05-14 NOTE — BH Assessment (Signed)
BHH Assessment Progress Note    Patient was seen for a re-assessment.  Patient continues to be delusional with loose thought associations and rambling.  Patient appears to know she is in the hospital, but disoriented to why she is in the hospital.  Patient continued to refer to "Chuckie" and was calling the TTS Counselor "Daddy."  Her conversation was incoherent and made no logical sense.  She was very animated and jumping around on the bed and acting very bizarre.  She was very disorganized and disheveled. Patient continues to be severely psychotic and in need of inpatient psych treatment.

## 2017-05-14 NOTE — ED Notes (Signed)
Pt sitting on bed, continues to talk excessively.

## 2017-05-14 NOTE — ED Notes (Signed)
Pt continues to talk excessively and is fixated on delusions.

## 2017-05-14 NOTE — ED Notes (Signed)
Pt awake and talking to sitter with inside voice but word salad. Thoughts not making sense together.

## 2017-05-14 NOTE — Progress Notes (Addendum)
Pt is on waiting list [facility].   Also [facility] to [facility], [facility], and [facility] today.  Declined [facility]  Both due to acuity [facility]  Due to not meeting voluntary criteria (facility only able to accept voluntary pts and per chart admissions feels pt would be unsafe to admit voluntarily due to her acute psychosis)  Ilean SkillMeghan Ayleen Mckinstry, MSW, LCSW Clinical Social Work 05/14/2017 Coverage for [phone]

## 2017-05-14 NOTE — ED Notes (Signed)
Pt refuses to shower - staff has offered and encouraged multiple times.

## 2017-05-14 NOTE — ED Notes (Signed)
Pt noted to be irritated - continues w/repetitive delusional talking. Pt standing in room - encouraging pt to remain in room. Risperdal M-Tab 2mg  given as ordered.

## 2017-05-14 NOTE — ED Notes (Signed)
Pt talking w/Sitter - continues w/delusional thinking.

## 2017-05-14 NOTE — ED Notes (Signed)
Pt sitting on bed - lights turned down as requested - pt continues w/continuous delusional talking.

## 2017-05-14 NOTE — ED Notes (Signed)
Regular diet breakfast tray ordered for pt.  

## 2017-05-14 NOTE — ED Notes (Signed)
Pt very manic, appears to be responding to internal stimuli.  Pt yelling, disrupting unit.  Room moved to further away from another pt, across from Pacific MutualN desk.  Pt appears to be calming down.

## 2017-05-15 ENCOUNTER — Other Ambulatory Visit: Payer: Self-pay

## 2017-05-15 MED ORDER — BENZTROPINE MESYLATE 1 MG PO TABS
1.0000 mg | ORAL_TABLET | Freq: Two times a day (BID) | ORAL | Status: DC
  Administered 2017-05-15 – 2017-06-01 (×30): 1 mg via ORAL
  Filled 2017-05-15 (×33): qty 1

## 2017-05-15 MED ORDER — HALOPERIDOL LACTATE 2 MG/ML PO CONC
5.0000 mg | Freq: Two times a day (BID) | ORAL | Status: DC
  Administered 2017-05-15 – 2017-05-17 (×2): 5 mg via ORAL
  Filled 2017-05-15 (×6): qty 2.5

## 2017-05-15 NOTE — ED Notes (Signed)
Orange juice x 2 given to pt for snack as requested.

## 2017-05-15 NOTE — ED Notes (Signed)
Pt slept briefly - now is awake stating she needs to leave so she can go to [facility]. States she and "Rudene ChristiansKanye West don't need to be just waiting up in here".

## 2017-05-15 NOTE — ED Notes (Addendum)
Pt took meds w/o difficulty - continues w/excessive, repetitive talking - delusional.

## 2017-05-15 NOTE — [facility] Counselor (Signed)
Pt is a 51 year old female who was [facility] for psychotic symptoms.  Pt was reassessed today.  She remains floridly psychotic, exhibiting flight of ideas, largely non-responsive to questions.  Pt spoke rapidly, loudly, incoherently.  Recommend continued inpatient.

## 2017-05-15 NOTE — ED Notes (Signed)
Pt ambulated to bathroom and back to room.

## 2017-05-15 NOTE — ED Notes (Signed)
Pt sleeping. Sitter at bedside.

## 2017-05-15 NOTE — ED Notes (Signed)
Pt has not slept much during the night.  Began to speak loudly and rapidly, having trouble staying in her room.  She verbalized her delusions to other people as she walked to the bathroom.  She speaks of a "chuckie"

## 2017-05-15 NOTE — ED Notes (Signed)
Pt standing in hallway while en route to bathroom talking to Sitter - pt moving both arms swiftly in the air w/o any difficulty as she is talking.

## 2017-05-15 NOTE — ED Notes (Signed)
Pt noted to be continuously talking to herself.

## 2017-05-15 NOTE — ED Notes (Signed)
Pt sharing delusions and hyper-religious thoughts w/ another patient.  Despite requests to stop responding to other pt she continues to sing loudly.  Security called to stand by, pt did refuse PO medications, informed her that RN was concerned about her continuing esclating of her behaviors.  Distraction was used in the form of TV to assist the pt in settling down.

## 2017-05-15 NOTE — ED Notes (Signed)
Pt given snack. 

## 2017-05-15 NOTE — ED Notes (Signed)
Pt has only slept a few minutes at a time. Pt c/o left upper arm pain - denies injury. Motrin given and Vistaril given.

## 2017-05-16 MED ORDER — LORAZEPAM 2 MG/ML IJ SOLN
2.0000 mg | Freq: Once | INTRAMUSCULAR | Status: AC
  Administered 2017-05-16: 2 mg via INTRAMUSCULAR
  Filled 2017-05-16: qty 1

## 2017-05-16 MED ORDER — LORAZEPAM 2 MG/ML IJ SOLN
1.0000 mg | Freq: Once | INTRAMUSCULAR | Status: AC
  Administered 2017-05-16: 1 mg via INTRAMUSCULAR
  Filled 2017-05-16: qty 1

## 2017-05-16 MED ORDER — DIPHENHYDRAMINE HCL 50 MG/ML IJ SOLN
50.0000 mg | Freq: Once | INTRAMUSCULAR | Status: AC
  Administered 2017-05-16: 50 mg via INTRAMUSCULAR
  Filled 2017-05-16: qty 1

## 2017-05-16 MED ORDER — LORAZEPAM 1 MG PO TABS
2.0000 mg | ORAL_TABLET | Freq: Once | ORAL | Status: AC
  Administered 2017-05-17: 2 mg via ORAL
  Filled 2017-05-16 (×2): qty 2

## 2017-05-16 MED ORDER — HALOPERIDOL LACTATE 5 MG/ML IJ SOLN
10.0000 mg | Freq: Once | INTRAMUSCULAR | Status: AC
  Administered 2017-05-16: 10 mg via INTRAMUSCULAR
  Filled 2017-05-16: qty 2

## 2017-05-16 MED ORDER — ARIPIPRAZOLE 10 MG PO TABS
10.0000 mg | ORAL_TABLET | Freq: Once | ORAL | Status: DC
Start: 2017-05-16 — End: 2017-05-16

## 2017-05-16 MED ORDER — DIPHENHYDRAMINE HCL 25 MG PO CAPS
50.0000 mg | ORAL_CAPSULE | Freq: Once | ORAL | Status: AC
  Administered 2017-05-17: 25 mg via ORAL
  Filled 2017-05-16 (×2): qty 2

## 2017-05-16 NOTE — ED Triage Notes (Signed)
PT sleeping ,resp unlabored. 

## 2017-05-16 NOTE — ED Notes (Signed)
Patient Teacher, English as a foreign languageDinner trays at [employer], until patient awake.

## 2017-05-16 NOTE — ED Triage Notes (Signed)
PT awake and ambulatory to bathroom. Sitter with PT.  Pt returned to room to eat lunch.

## 2017-05-16 NOTE — ED Notes (Signed)
Breakfast tray ordered for pt, reg diet, no sharps
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
## 2017-05-16 NOTE — ED Triage Notes (Signed)
Pt trying to leave unit . Pt continues  To speak in a rapid , streaming speech.

## 2017-05-16 NOTE — ED Triage Notes (Signed)
PT  Walking in room and pulled the emergency Code leaver.
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
## 2017-05-16 NOTE — Progress Notes (Signed)
Pt sitting on bed eating breakfast.

## 2017-05-16 NOTE — ED Triage Notes (Signed)
Pt sitting on bed speaking in streaming voice stating" I am Satan " Pt. Voice raspy and loud. Pt reports I am Satan and I killed my dog.

## 2017-05-16 NOTE — ED Triage Notes (Signed)
PT is throwing  Cups at sitter . Pt putting her face directly in front of sitter calling her crazy.

## 2017-05-16 NOTE — ED Triage Notes (Signed)
Pt awake and speaking in streaming disorganized thoughts.
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
## 2017-05-16 NOTE — ED Triage Notes (Signed)
Pt . Agitated and pacing in room . Pt walking up to sitter and putting her face right up to Sitters face. Pt speaking in a very loud voice . Speech is constant streaming. Pt has flight of ideas. Pt woke up at 0300 and has been agitated since then. PT is arguing with sitter about food . Pt reported the food does not belong to her. Sitter  Warmed Pt food up . Pt is now in bed eating meal.
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
## 2017-05-16 NOTE — ED Triage Notes (Signed)
Dr Anitra LauthPlunkett informed  Of PT refusal to take PO meds . Pt threw meds on floor.

## 2017-05-16 NOTE — ED Notes (Signed)
Pt awake walking with sitter and RN to the bathroom.

## 2017-05-16 NOTE — ED Notes (Signed)
Pt ambulatory to the restroom, mumbling random sentences the whole way there and back. Pt helped into bed and provided with warm blankets. Pt calm, resting in bed at this time
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
## 2017-05-16 NOTE — ED Triage Notes (Signed)
Reported to EDP floyd PT behavior.

## 2017-05-16 NOTE — ED Notes (Signed)
Patient given salad and A Regular Diet was taken for Dinner.
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
## 2017-05-16 NOTE — ED Triage Notes (Signed)
PT manic and talking constantly. Pt intermittent laughing and talking.

## 2017-05-17 DIAGNOSIS — G4700 Insomnia, unspecified: Secondary | ICD-10-CM

## 2017-05-17 DIAGNOSIS — F312 Bipolar disorder, current episode manic severe with psychotic features: Secondary | ICD-10-CM

## 2017-05-17 DIAGNOSIS — Z818 Family history of other mental and behavioral disorders: Secondary | ICD-10-CM

## 2017-05-17 MED ORDER — HALOPERIDOL LACTATE 5 MG/ML IJ SOLN
5.0000 mg | INTRAMUSCULAR | Status: DC
  Administered 2017-05-18 – 2017-05-20 (×3): 5 mg via INTRAMUSCULAR
  Filled 2017-05-17 (×3): qty 1

## 2017-05-17 MED ORDER — DIPHENHYDRAMINE HCL 50 MG/ML IJ SOLN
50.0000 mg | Freq: Once | INTRAMUSCULAR | Status: AC
  Administered 2017-05-17: 50 mg via INTRAMUSCULAR
  Filled 2017-05-17: qty 1

## 2017-05-17 MED ORDER — LORAZEPAM 2 MG/ML IJ SOLN
2.0000 mg | Freq: Once | INTRAMUSCULAR | Status: AC
  Administered 2017-05-17: 2 mg via INTRAMUSCULAR
  Filled 2017-05-17: qty 1

## 2017-05-17 MED ORDER — HALOPERIDOL LACTATE 5 MG/ML IJ SOLN
10.0000 mg | Freq: Every evening | INTRAMUSCULAR | Status: DC
  Administered 2017-05-17 – 2017-05-19 (×3): 10 mg via INTRAMUSCULAR
  Filled 2017-05-17 (×3): qty 2

## 2017-05-17 MED ORDER — HALOPERIDOL LACTATE 2 MG/ML PO CONC: 5.0000 mg | ORAL | Status: DC

## 2017-05-17 MED ORDER — OLANZAPINE 5 MG PO TBDP: 10.0000 mg | ORAL_TABLET | Freq: Every day | ORAL | Status: DC

## 2017-05-17 MED ORDER — OLANZAPINE 5 MG PO TBDP
10.0000 mg | ORAL_TABLET | Freq: Once | ORAL | Status: AC
  Administered 2017-05-17: 10 mg via ORAL
  Filled 2017-05-17: qty 2

## 2017-05-17 MED ORDER — LITHIUM CARBONATE ER 300 MG PO TBCR
300.0000 mg | EXTENDED_RELEASE_TABLET | Freq: Two times a day (BID) | ORAL | Status: DC
  Administered 2017-05-18 – 2017-05-20 (×4): 300 mg via ORAL
  Filled 2017-05-17 (×7): qty 1

## 2017-05-17 NOTE — ED Notes (Signed)
Pt continues w/excessive talking, calling staff names, and threatening to kill one of the staff members.

## 2017-05-17 NOTE — ED Notes (Signed)
Security standingby.

## 2017-05-17 NOTE — ED Notes (Signed)
Yvonne [name][facility] - aware of request for Face-to-Face consult d/t pt's behavior not improving - worsening.

## 2017-05-17 NOTE — ED Notes (Signed)
Pt refusing to stay in room and continues w/excessive delusional speech.

## 2017-05-17 NOTE — ED Notes (Signed)
Dr Adela LankFloyd aware of pt's behavior - threatening staff and refusing to stay in room. Order received for Zyprexa.

## 2017-05-17 NOTE — ED Notes (Signed)
Dr Sharma CovertNorman w/pt in hallway - d/t pt refuses to stay in room.

## 2017-05-17 NOTE — Consult Note (Signed)
[facility] Face-to-Face Psychiatry Consult   Reason for Consult:  Medication management for agitation.  Referring Physician:  Dr. Anitra Lauth Patient Identification: Yvonne [name] MRN:  [id number] Principal Diagnosis: Bipolar I disorder, current or most recent episode manic, with psychotic features [facility]) Diagnosis:   Patient Active Problem List   Diagnosis Date Noted  . 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 Bipolar affective disorder, current episode mild (HCC) [F31.9] 01/05/2017  . Manic behavior (HCC) [F30.10]   . Facial cellulitis [L03.211] 02/10/2014    Total Time spent with patient: 45 minutes  Subjective:   Yvonne [name] is a 51 y.o. female patient admitted with agitation and psychosis on 10/31. She is awaiting placement for a bed at [facility].  HPI:   Yvonne [name] refused to see TTS for a telepsych interview today so a face-to-face was requested. She has been agitated since admission even after receiving several emergency behavioral medications for stabilization and treatment. She was placed back on her home medications (Abilify 10 mg daily and Cogentin 1 mg BID). Haldol 5 mg BID was started on 11/5. She received Zyprexa 10 mg and Ativan 2 mg this morning. She is also prescribed Atarax as needed for anxiety. She was last seen for agitation and psychosis in the [facility] and discharged on 10/22 in stable condition on Zyprexa 15 mg BID and Tegretol 200 mg BID for mood stabilization with Trazodone 50 mg qhs for sleep.   Patient was peered in speech and disorganized in thought process and behavior throughout interview. She became increasingly irritable with questions and exhibited multiple delusional thoughts. She kept referring to the notewriter as "[employer] boo" and inquired about money that she had given to the notewriter. She denies problems with appetite and sleeping.   Past Psychiatric History: Bipolar disorder   Risk to Self: Suicidal Ideation: (UTA pt responding to  internal stimuli) Suicidal Intent: (UTA pt responding to internal stimuli) Is patient at risk for suicide?: (UTA pt responding to internal stimuli) Suicidal Plan?: (UTA pt responding to internal stimuli) Access to Means: (UTA pt responding to internal stimuli) What has been your use of drugs/alcohol within the last 12 months?: UTA pt responding to internal stimuli How many times?: (UTA pt responding to internal stimuli) Other Self Harm Risks: (UTA pt responding to internal stimuli) Triggers for Past Attempts: (UTA pt responding to internal stimuli) Intentional Self Injurious Behavior: (UTA pt responding to internal stimuli) Risk to Others: Homicidal Ideation: (UTA pt responding to internal stimuli) Thoughts of Harm to Others: (UTA pt responding to internal stimuli) Current Homicidal Intent: (UTA pt responding to internal stimuli) Current Homicidal Plan: (UTA pt responding to internal stimuli) Access to Homicidal Means: (UTA pt responding to internal stimuli) Identified Victim: (UTA pt responding to internal stimuli) History of harm to others?: (UTA pt responding to internal stimuli) Assessment of Violence: (UTA pt responding to internal stimuli) Violent Behavior Description: UTA pt responding to internal stimuli Does patient have access to weapons?: (UTA pt responding to internal stimuli) Criminal Charges Pending?: (UTA pt responding to internal stimuli) Does patient have a court date: (UTA pt responding to internal stimuli) Court Date: (UTA pt responding to internal stimuli) Prior Inpatient Therapy: Prior Inpatient Therapy: Yes Prior Therapy Dates: 12/2016 Prior Therapy Facilty/Provider(s): [facility] Reason for Treatment: psychosis Prior Outpatient Therapy: She sees [name] for medication management.   Past Medical History:  Past Medical History:  Diagnosis Date  . Bipolar 1 disorder (HCC)   . Medical history non-contributory   .  Mental disorder   . No pertinent past medical  history     Past Surgical History:  Procedure Laterality Date  . NO PAST SURGERIES     Family History:  Family History  Problem Relation Age of Onset  . Depression Sister   . Depression Brother   . Depression Sister   . CAD Mother   . Hypertension Father   . Diabetes Father   . Bipolar disorder Cousin    Family Psychiatric  History: Unknown  Social History:  Social History   Substance and Sexual Activity  Alcohol Use No     Social History   Substance and Sexual Activity  Drug Use No    Social History   Socioeconomic History  . Marital status: Single    Spouse name: None  . Number of children: None  . Years of education: None  . Highest education level: None  Social Needs  . Financial resource strain: None  . Food insecurity - worry: None  . Food insecurity - inability: None  . Transportation needs - medical: None  . Transportation needs - non-medical: None  Occupational History  . None  Tobacco Use  . Smoking status: Never Smoker  . Smokeless tobacco: Never Used  Substance and Sexual Activity  . Alcohol use: No  . Drug use: No  . Sexual activity: No  Other Topics Concern  . None  Social History Narrative  . None   Additional Social History: N/A    Allergies:  No Known Allergies  Labs: No results found for this or any previous visit (from the past 48 hour(s)).  Current Facility-Administered Medications  Medication Dose Route Frequency Provider Last Rate Last Dose  . alum & mag hydroxide-simeth (MAALOX/MYLANTA) 200-200-20 MG/5ML suspension 30 mL  30 mL Oral Q6H PRN Street, [place], [place]      . ARIPiprazole (ABILIFY) tablet 10 mg  10 mg Oral Daily [address], [place], [place]   10 mg at 05/17/17 [id number]  . benztropine (COGENTIN) tablet 1 mg  1 mg Oral BID [place]on[place]wo, Justina A, NP   1 mg at 05/17/17 [id number]  . haloperidol lactate (HALDOL) injection 10 mg  10 mg Intramuscular QPM Juanetta BeetsNorman, Tien Aispuro J, DO   10 mg at 05/17/17 1844  . [START ON 05/18/2017] haloperidol  lactate (HALDOL) injection 5 mg  5 mg Intramuscular Regan LemmingBH-q7a Kohut, Stephen, MD      . hydrOXYzine (ATARAX/VISTARIL) tablet 25 mg  25 mg Oral Q6H PRN Street, [place], [place]   25 mg at 05/17/17 1514  . lithium carbonate (LITHOBID) CR tablet 300 mg  300 mg Oral Q12H Cherly BeachNorman, Everhett Bozard J, DO      . nicotine (NICODERM CQ - dosed in mg/24 hours) patch 21 mg  21 mg Transdermal Daily PRN Street, [place], PA-C      . ondansetron [facility](ZOFRAN) tablet 4 mg  4 mg Oral Q8H PRN Street, [place], [place]      . traZODone (DESYREL) tablet 50 mg  50 mg Oral QHS PRN Street, [place], [place]   50 mg at 05/16/17 [id number]   Current Outpatient Medications  Medication Sig Dispense Refill  . ARIPiprazole (ABILIFY) 10 MG tablet Take 1 tablet (10 mg total) by mouth daily. 30 tablet 0  . carbamazepine (TEGRETOL XR) 200 MG 12 hr tablet Take 1 tablet (200 mg total) by mouth 2 (two) times daily. For mood stabilization 60 tablet 0  . hydrOXYzine (ATARAX/VISTARIL) 25 MG tablet Take 1 tablet (25 mg total) by mouth every 6 (six) hours as  needed for anxiety. 60 tablet 0  . OLANZapine zydis (ZYPREXA) 15 MG disintegrating tablet Take 1 tablet (15 mg total) by mouth 2 (two) times daily. 60 tablet 0  . propranolol (INDERAL) 10 MG tablet Take 1 tablet (10 mg total) by mouth 2 (two) times daily. For anxiety 60 tablet 0  . traZODone (DESYREL) 50 MG tablet Take 1 tablet (50 mg total) by mouth at bedtime as needed for sleep. 30 tablet 0    Musculoskeletal: Strength & Muscle Tone: within normal limits Gait & Station: normal Patient leans: N/A  Psychiatric Specialty Exam: Physical Exam  Nursing note and vitals reviewed. Constitutional: She appears well-developed and well-nourished.  HENT:  Head: Normocephalic and atraumatic.  Neck: Normal range of motion.  Respiratory: Effort normal.  Musculoskeletal: Normal range of motion.  Neurological: She is alert.  Skin: No rash noted.    Review of Systems  Psychiatric/Behavioral: Negative for  depression, substance abuse and suicidal ideas. The patient has insomnia. The patient is not nervous/anxious.     Blood pressure (!) 141/111, pulse (!) 108, temperature 98 F (36.7 C), temperature source Axillary, resp. rate 20, height 5\' 3"  (1.6 m), weight 44.5 kg (98 lb), SpO2 100 %.Body mass index is 17.36 kg/m.  General Appearance: Disheveled, middle aged, African American female with short hair that is matted and hospital scrubs. NAD.   Eye Contact:  Fair  Speech:  Pressured  Volume:  Increased  Mood:  Irritable  Affect:  Labile  Thought Process:  Disorganized  Orientation:  Other:  Oriented to self. Unable to fully assess due to agitation.  Thought Content:  Illogical, Delusions and Tangential  Suicidal Thoughts:  No  Homicidal Thoughts:  No  Memory:  Immediate;   Poor Recent;   Poor Remote;   Poor  Judgement:  Impaired  Insight:  Lacking  Psychomotor Activity:  Increased as patient was agitated and pacing hallway.   Concentration:  Concentration: Poor and Attention Span: Poor  Recall:  Poor  Fund of Knowledge:  Poor  Language:  Poor  Akathisia:  No  Handed:  Right  AIMS (if indicated):   N/A  Assets:  Resilience  ADL's:  Intact  Cognition:   Impaired secondary to illness.  Sleep:   Poor   Assessment: Yvonne [name] is 51 y.o. female who was admitted with agitation and psychosis on 10/31. She [facility]. She continues to be agitated, pressured in speech and disorganized in behavior and thought process today. Medication recommendations were provided below to the primary team.   Treatment Plan Summary: -Continue Abilify 10 mg daily.  -Increase Haldol 5 mg BID to 5 mg q am and 10 mg qhs for mood stabilization and psychosis.  -Continue Cogentin 1 mg BID. -Start Lithium 300 mg BID for mood stabilization. Obtain level in 5 days. -If Lithium is not effective then consider switching to Tegretol since patient was last prescribed this medication  although it appears she was still manic on recent dose (200 mg BID) and it will likely need to be titrated.  -Obtain new EKG to monitor for QTc prolongation.  -Check thyroid labs given Lithium use can cause thyroid disease.  -Continue to have TTS follow up unless she refuses then can have face-to-face as needed.   Disposition: Recommend psychiatric Inpatient admission when medically cleared.  Cherly BeachJacqueline J Onnie Alatorre, DO 05/17/2017 11:06 PM

## 2017-05-17 NOTE — ED Notes (Signed)
Patient was given Administrator, sportsaltine Crackers and [employer]range Juice. A Regular Diet was Ordered for Lunch.

## 2017-05-17 NOTE — ED Notes (Signed)
Pt continues to refuse to be cooperative - standing in hallway and talking to other pt's and staff. RN escorted pt to room and allowed pt to talk.

## 2017-05-17 NOTE — ED Notes (Signed)
Pt ate cracker w/peanut butter and drinking orange juice - will lay down on bed then sit back up - repetitive, excessive speech continues. Pt attempted to stand - pt noted to be weak - pt assisted back to bed - encouraged pt to stay on bed for safety. Pt voices understanding then continues w/delusional speech.

## 2017-05-17 NOTE — ED Notes (Signed)
[facility] paperwork faxed to Magistrate - verified receipt w/Magistrate.

## 2017-05-17 NOTE — ED Notes (Signed)
Patient refused to have her Vital Signs taken.

## 2017-05-17 NOTE — ED Notes (Signed)
Patient walking around at this time.

## 2017-05-17 NOTE — ED Notes (Signed)
Pt refusing to eat dinner - threw food from tray on floor.

## 2017-05-17 NOTE — ED Notes (Signed)
Pt continues to ambulate in hallway - talking to Sitters and pt's - pt calling sitters and pt's names - pt stated to sitter "I'm going to kill you". Pt returns to room when escorted by RN.

## 2017-05-17 NOTE — ED Notes (Signed)
Pt continues to attempt to leave room and continues w/excessive talking.

## 2017-05-17 NOTE — ED Notes (Signed)
Security assisting w/pt d/t threatening staff and refusing to go back to room.

## 2017-05-17 NOTE — ED Notes (Signed)
Pt refusing to stay in room as encouraged by staff - continues w/repetitive excessive talking - delusional - talking to another pt - stating "I'm Satan" and dancing/gesturing inappropriately. Pt refusing to finish eating her breakfast d/t wants toast. Pt given orange juice x 2. Pt took Ativan given well.

## 2017-05-17 NOTE — Progress Notes (Signed)
The patient has been uncooperative and [name] not participate in a "tele-evaluation" with [facility]. It has been requested that the patient be seen for a "Face to Face" consult.   Disposition CSW attempted to contact Dr. Roosvelt HarpsJackie Norman to notify her that the patient [name] need a Face to Face psychiatric consult for medication recommendations. There was no answer at this time.   CSW [name] continue to try and reach Dr. Sharma CovertNorman.    Yvonne DaubJolan Amylah [name] MSW, LCSWA CSW Disposition [phone]

## 2017-05-17 NOTE — ED Notes (Addendum)
Pt continues to have hyper religious ideations; pt currently being assisted to bathroom; Security in POD for safety; Charge RN in pod as well at this time

## 2017-05-17 NOTE — ED Notes (Signed)
Pt continues to refuse to stay in room - attempting to talk to RN at desk. RN escorted pt to her room and advised her RN can only talk to her in her room. Pt continues w/delusional talking.

## 2017-05-17 NOTE — ED Notes (Signed)
Patient refused to eat Dinner.

## 2017-05-17 NOTE — ED Notes (Addendum)
Dr.Plunkett called to get some medication for pt; pt is agitated and verbally abusive to staff and threw food out door at staff; security in pod;

## 2017-05-17 NOTE — ED Notes (Signed)
Pt took meds w/o difficulty. Pt continues to attempt to ambulate in hallway and talk w/other pt's.

## 2017-05-17 NOTE — ED Notes (Signed)
Dr Sharma CovertNorman aware pt's behavior escalating - order received to change Haldol 5mg  BID to - 5mg  po qam and 10mg  IM every evening. Readback for verification.

## 2017-05-17 NOTE — ED Notes (Signed)
[facility] paperwork given to Dr Anitra LauthPlunkett.

## 2017-05-17 NOTE — ED Notes (Addendum)
Pt threw water on floor - Security standing by assisting. Pt continues yelling - refusing to stay in room.

## 2017-05-18 MED ORDER — LORAZEPAM 2 MG/ML IJ SOLN
INTRAMUSCULAR | Status: AC
Start: 2017-05-18 — End: 2017-05-18
  Administered 2017-05-18: 09:00:00 via INTRAMUSCULAR
  Filled 2017-05-18: qty 1

## 2017-05-18 MED ORDER — KETAMINE HCL 50 MG/ML IJ SOLN
4.0000 mg/kg | Freq: Once | INTRAMUSCULAR | Status: AC
  Administered 2017-05-18: 180 mg via INTRAMUSCULAR
  Filled 2017-05-18: qty 10

## 2017-05-18 MED ORDER — LORAZEPAM 2 MG/ML IJ SOLN
2.0000 mg | Freq: Once | INTRAMUSCULAR | Status: AC
  Administered 2017-05-18: 2 mg via INTRAMUSCULAR
  Filled 2017-05-18: qty 1

## 2017-05-18 MED ORDER — ZIPRASIDONE MESYLATE 20 MG IM SOLR
20.0000 mg | Freq: Once | INTRAMUSCULAR | Status: AC
  Administered 2017-05-18: 20 mg via INTRAMUSCULAR
  Filled 2017-05-18: qty 20

## 2017-05-18 NOTE — ED Notes (Signed)
Pt has the yelling and and being very aggressive [employer] has tried to clam and redirect pt . PT had came out of room twice and [employer] has redirected pt back to room

## 2017-05-18 NOTE — ED Notes (Addendum)
This nurse compromised with patient that if patient stays calm and cooperative, she is able to eat dinner in hall chair.

## 2017-05-18 NOTE — ED Notes (Signed)
Pt out of room and will not go back to room. Pt is arguing with staff.  Security called.

## 2017-05-18 NOTE — ED Notes (Signed)
Dr. Rush Landmarkegeler at bedside to assess patient to see what needs to be done due to her aggressive behavior.  Security at bedside.

## 2017-05-18 NOTE — ED Notes (Signed)
Patient refusing to eat lunch

## 2017-05-18 NOTE — ED Notes (Signed)
Pt still has not gone to sleep.  Pt is still talking out loud in tangents.  Pt is staying in bed at this moment.

## 2017-05-18 NOTE — ED Notes (Signed)
Spoke to Dr. Dalene SeltzerSchlossman and she is going to order geodon for patient.

## 2017-05-18 NOTE — ED Notes (Signed)
Pt has been talking to [employer] no stop for a couple of hours. Pt has been getting aggressive every now and then; yelling and cursing at sitter and talking badly about sitter appearance. [employer] is still at bed side with pt.

## 2017-05-18 NOTE — ED Provider Notes (Signed)
8:51 PM Nursing called to request further medication for patient as patient is wandering in the hallways, throwing things, and going into other patient's rooms.  After multiple verbal attempts at redirection patient refused other medications orally.  Patient was discussed with previous provider as patient required multiple doses of IM medications today for similar agitation and dangerous behavior.  Patient received IM Geodon, Haldol, with intermittent success.  According to previous provider, plan was to escalate to IM ketamine for agitation if behavior persisted.  After being called by nursing, I went to evaluate the patient.  Patient was wandering through the hallway and making threats at staff and other patients.  Patient would not be redirected verbally.  Patient refused oral medications.  Patient had IM ketamine ordered to try and alleviate the agitation.  9:33 PM Patient was reassessed by me after IM ketamine.  Patient is resting calmly in the room talking to the ceiling.  Patient is no longer acting as a threat to others.  Nursing instructed to update for any changes or problems.   Cheria Sadiq, Canary Brimhristopher J, MD 05/18/17 [phone]

## 2017-05-18 NOTE — ED Notes (Signed)
Pt asleep. No snack given.  

## 2017-05-18 NOTE — ED Notes (Signed)
Breakfast order placed. Patient pacing back and forth in front of nurses' station.  Security within arms length of patient.

## 2017-05-18 NOTE — ED Notes (Signed)
Dinner tray at bedside and patient is eating.

## 2017-05-18 NOTE — ED Notes (Signed)
Pt laying in bed talking out loud in tangents.

## 2017-05-18 NOTE — ED Notes (Signed)
Pt assisted to bathroom

## 2017-05-18 NOTE — ED Notes (Signed)
Lunch tray ordered 

## 2017-05-18 NOTE — [facility] Counselor (Signed)
Re-assessment:   Per patient nurse Pete Glatter(Hol[place], RN) patient unable to be assessed by TTS. Security has been placed by the patient bedside. Patient has been displaying aggressive behaviors and making verbal threats. Patient assisted with medication administration due to aggressive behaviors. Patient also given 2mg  IM Ativan.
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
## 2017-05-18 NOTE — ED Provider Notes (Signed)
Received care of patient at 599 AM.  Briefly this is a 51 year old female with a history of bipolar, manic behavior and psychosis who presented with concern for psychosis.  Patient has been acutely psychotic since arrival, and has required multiple medications and adjustments.  This morning, she continues to be agitated.  She received 5 mg of as needed Haldol about 30 minutes ago, and continues to have agitation, getting up out of her chair, displaying rapid pressured speech and flight of ideas, and speaking about the "demon" in the room next door.    Ordered 2mg  IM ativan for acute agitation.    Again called regarding agitation. Geodon 20mg  IM given.   Patient slept for 2 hours then became agitated again, I evaluated her at bedside and found her yelling, cussing, leaving her room and making threats towards staff.  She was given additional 2mg  of IM ativan, had continued agitation and was given IM haldol once again.  Would consider ketamine if her agitation continues.  CRITICAL CARE: agitation Performed by: Lynnea FerrierErin E Jerita Wimbush   Total critical care time: 30 minutes  Critical care time was exclusive of separately billable procedures and treating other patients.  Critical care was necessary to treat or prevent imminent or life-threatening deterioration.  Critical care was time spent personally by me on the following activities: development of treatment plan with patient and/or surrogate as well as nursing, discussions with consultants, evaluation of patient's response to treatment, examination of patient, obtaining history from patient or surrogate, ordering and performing treatments and interventions, ordering and review of laboratory studies, ordering and review of radiographic studies, pulse oximetry and re-evaluation of patient's condition.    Alvira MondaySchlossman, Jasmia Angst, MD 05/18/17 [phone]

## 2017-05-18 NOTE — ED Notes (Signed)
Patient is calming down at this time.  Patient is seated on bed.

## 2017-05-18 NOTE — ED Notes (Signed)
Dinner tray ordered.

## 2017-05-18 NOTE — ED Notes (Addendum)
Pt has touch on [employer] twice

## 2017-05-18 NOTE — ED Notes (Signed)
Assisted to bathroom

## 2017-05-18 NOTE — ED Notes (Signed)
Security present at bedside. Assisted with medication administration for aggressive behavior and verbal threats. MD Schlossman rounded on patient, gave verbal order for 2 mg IM ativan.

## 2017-05-19 MED ORDER — STERILE WATER FOR INJECTION IJ SOLN
INTRAMUSCULAR | Status: AC
  Administered 2017-05-19: 1.2 mL
  Filled 2017-05-19: qty 10

## 2017-05-19 MED ORDER — ZIPRASIDONE MESYLATE 20 MG IM SOLR
20.0000 mg | Freq: Once | INTRAMUSCULAR | Status: AC
  Administered 2017-05-19: 20 mg via INTRAMUSCULAR
  Filled 2017-05-19: qty 20

## 2017-05-19 MED ORDER — LORAZEPAM 2 MG/ML IJ SOLN
2.0000 mg | Freq: Once | INTRAMUSCULAR | Status: AC
  Administered 2017-05-19: 2 mg via INTRAVENOUS
  Filled 2017-05-19: qty 1

## 2017-05-19 NOTE — ED Notes (Signed)
Will hold 1800 administration of IM haldol for the time being d/t pt having just received her dinner tray and her apparent agitation toward having been given "shots" earlier in the day.

## 2017-05-19 NOTE — ED Notes (Signed)
Pt  Still yelling cussing, threatening to kill this RN. Pt repeatedly trying to leave room. 4 security guards still at bedside. Pt yelling "daddy!" at the security guards. Pt yelling about batman, robin, the joker, being a crocodile, talking about her bat cave. Pt with pressured speech. Pt agitated in door and repeatedly having to be redirected back into room.

## 2017-05-19 NOTE — ED Notes (Addendum)
Pt out of room and at nurse's station, refusing to return to her room. Pt becoming increasingly agitated and continues to make verbal threats of violence toward sitter and RN.   Security contacted for assistance. Informed Preston FleetingGlick, MD regarding pt's increased agitation. Given verbal orders for 20mg  IM injection of geodon.

## 2017-05-19 NOTE — ED Notes (Signed)
Breakfast tray ordered 

## 2017-05-19 NOTE — ED Notes (Signed)
Pt back in room, resting
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
## 2017-05-19 NOTE — ED Notes (Signed)
Pt repeatedly threatening to kill this RN stating "Im gonna cut her".

## 2017-05-19 NOTE — ED Notes (Signed)
Pt continues to yell at nurses and sitters, sentences are disorganized, violent, and focus mostly on "crunching necks" and "sliting throats"

## 2017-05-19 NOTE — ED Notes (Addendum)
Pt continuing to lean into sitter, seeming to interrogate her about the "boy next door".   "I'm waiting on you to answer. You've got three seconds to live before I choke your neck. I'm waiting for an answer. You stupid."  Pt unaware/unaccepting of her own actions. Will threaten sitter or other staff, then deny that she had just done exactly that.

## 2017-05-19 NOTE — ED Notes (Signed)
Pt still at door of her room, ranting about the pt that had been next door to her. "That nurse just let him go for no reason. He and I are twin brother and sister. He wasn't supposed to get out. I'm Jesus's momma, the Messiah, we done been healing a lot of people in this hospital. That's why there aren't a bunch of dead people here."

## 2017-05-19 NOTE — ED Notes (Signed)
Pt yelling "I am batwoman! Y'all dumbassess!" Pt states that she has to use the bathroom, pt escorted to restroom. Pt yelling "shut up chucky butt!" Pt kicking and pushing trash cans.

## 2017-05-19 NOTE — ED Notes (Signed)
Breakfast tray at bedside 

## 2017-05-19 NOTE — ED Notes (Signed)
Per [facility] pt [facility] still on the [facility], [facility] - no new updates at this time.
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
## 2017-05-19 NOTE — ED Notes (Signed)
Pt ate about a third of her dinner tray. Given juice and cup of CF Coke.
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
## 2017-05-19 NOTE — ED Notes (Signed)
Discussed with Erin HearingMessner, MD regarding pt's continued agitation.

## 2017-05-19 NOTE — Care Management (Signed)
Patient [facility]
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
## 2017-05-19 NOTE — ED Notes (Signed)
Pt beginning to wake up, calling for various people of different names. Becoming agitated. Asked for orange juice. Explained that she would need to wait for her breakfast. Offered water, but pt refused. Pt continuing to demand various things, followed by various verbal threats.
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
## 2017-05-19 NOTE — ED Notes (Signed)
Pt shaking bed rails yelling "there's a fire in the room! Fire! Fire!", no fire noted to be in room.

## 2017-05-19 NOTE — ED Notes (Signed)
Regular Diet has been ordered for Lunch. 
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
## 2017-05-19 NOTE — ED Notes (Addendum)
Pt becoming sleepy, pt helped into bed by security still talking "where is chucky butt?! Have you met my husband?! I don't take no medication and that dumbass nurse there (pointing at this RN) gave me the wrong medication!"

## 2017-05-19 NOTE — ED Notes (Signed)
TTS set up at bedside - pt to sleepy to complete assessment.
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
## 2017-05-19 NOTE — BH Assessment (Signed)
[facility] Assessment Progress Note  Clinician attempted reassessment this morning. Pt too somnolent to participate. Several attempts made by clinician and tech in the room to arouse pt, but attempts were unsuccessful. It is noted that pt remains delusional and psychotic AEB RN notes as recent as this AM. IP treatment is still needed. [facility].   Johny ShockSamantha M. Ladona Ridgelaylor, MS, NCC, LPCA Counselor

## 2017-05-19 NOTE — ED Provider Notes (Signed)
Patient had calmed down following dose of ketamine.  However, he became agitated again and started wandering the halls and throwing things again.  He was given a dose of ziprasidone, and has been sleeping since then.  Patient reevaluated and is resting comfortably.  Vital signs are stable.  CRITICAL CARE Performed by: Dione BoozeGLICK,Warden Buffa Total critical care time: 40 minutes Critical care time was exclusive of separately billable procedures and treating other patients. Critical care was necessary to treat or prevent imminent or life-threatening deterioration. Critical care was time spent personally by me on the following activities: development of treatment plan with patient and/or surrogate as well as nursing, discussions with consultants, evaluation of patient's response to treatment, examination of patient, obtaining history from patient or surrogate, ordering and performing treatments and interventions, ordering and review of laboratory studies, ordering and review of radiographic studies, pulse oximetry and re-evaluation of patient's condition.   Dione BoozeGlick, Felishia Wartman, MD 05/19/17 [phone]

## 2017-05-19 NOTE — ED Notes (Signed)
Pt threatening to kill  This RN. Pt having to be re-directed back into room. Security and [employer] at bedside. Pt yelling about batman and robin. Pt saying "I'm the joker! Don't you know the joker?!".

## 2017-05-19 NOTE — ED Notes (Signed)
Pt threatening and Corporate investment bankerinsulting sitter. This RN explained that she could not continue to speak to other people in such a way. Attempted to close door (with blinds open) to isolate pt because the open door just continues to encourage pt to yell, insult and Production assistant, radiothreaten sitter, RN or other staff. Pt became more agitated, pushing bedside table against wall and yanking door open. When this RN explains that her actions and words are unacceptable, pt denies having been agitated or verbally threatening.   Called Security to assist with medication administration. Explained to pt that she was receiving geodon IM d/t continued agitation, abusive speech, and threats. Pt denies and physically resists.

## 2017-05-19 NOTE — Progress Notes (Signed)
Disposition CSW spoke with [name] at [facility].   Per [name], the [facility].     Baldo DaubJolan Refugio Mcconico MSW, LCSWA CSW Disposition [phone]

## 2017-05-19 NOTE — Progress Notes (Signed)
Disposition CSW received a call back from [facility] regarding the patient being placed on their "Priority List"  Per [facility], the patient does not meet [facility].   The patient remains on their regular wait list.   CSW will continue to follow.   Baldo DaubJolan Tien Spooner MSW, LCSWA CSW Disposition [phone]

## 2017-05-19 NOTE — ED Notes (Signed)
Contacted Visual merchandiserecurity staff and [employer] officer to assist in medication administration of IM haldol. Pt continues to insist that haldol is "for Danny, not me. I don't take any haldol or geodon shots. Dr. Hulen SkainsAkin didn't order that for me." This RN, confirmed that EDP had ordered the haldol for her specifically. Pt became highly verbally aggressive and physically resisted IM administration. Required three Security staff, [employer] officer, and sitter to safely restrain pt for medication administration by this RN.

## 2017-05-20 LAB — LITHIUM LEVEL: LITHIUM LVL: 0.86 mmol/L (ref 0.60–1.20)

## 2017-05-20 MED ORDER — HALOPERIDOL LACTATE 5 MG/ML IJ SOLN
10.0000 mg | Freq: Every day | INTRAMUSCULAR | Status: DC
  Filled 2017-05-20: qty 2

## 2017-05-20 MED ORDER — HALOPERIDOL LACTATE 5 MG/ML IJ SOLN: 5.0000 mg | Freq: Three times a day (TID) | INTRAMUSCULAR | Status: DC | PRN

## 2017-05-20 MED ORDER — HALOPERIDOL 5 MG PO TABS
5.0000 mg | ORAL_TABLET | Freq: Two times a day (BID) | ORAL | Status: DC
  Administered 2017-05-20 – 2017-05-21 (×2): 5 mg via ORAL
  Filled 2017-05-20 (×2): qty 1

## 2017-05-20 MED ORDER — HALOPERIDOL 5 MG PO TABS: 5.0000 mg | ORAL_TABLET | Freq: Three times a day (TID) | ORAL | Status: DC

## 2017-05-20 MED ORDER — HALOPERIDOL LACTATE 5 MG/ML IJ SOLN: 5.0000 mg | Freq: Two times a day (BID) | INTRAMUSCULAR | Status: DC

## 2017-05-20 MED ORDER — HALOPERIDOL 5 MG PO TABS: 5.0000 mg | ORAL_TABLET | Freq: Three times a day (TID) | ORAL | Status: DC | PRN

## 2017-05-20 MED ORDER — LITHIUM CARBONATE ER 300 MG PO TBCR
300.0000 mg | EXTENDED_RELEASE_TABLET | Freq: Three times a day (TID) | ORAL | Status: DC
  Administered 2017-05-20 – 2017-05-23 (×10): 300 mg via ORAL
  Filled 2017-05-20 (×11): qty 1

## 2017-05-20 MED ORDER — HALOPERIDOL 5 MG PO TABS: 10.0000 mg | ORAL_TABLET | Freq: Every evening | ORAL | Status: DC

## 2017-05-20 MED ORDER — HALOPERIDOL LACTATE 5 MG/ML IJ SOLN: 5.0000 mg | Freq: Three times a day (TID) | INTRAMUSCULAR | Status: DC

## 2017-05-20 MED ORDER — HALOPERIDOL 5 MG PO TABS
10.0000 mg | ORAL_TABLET | Freq: Every day | ORAL | Status: DC
  Administered 2017-05-21 – 2017-05-31 (×11): 10 mg via ORAL
  Filled 2017-05-20 (×12): qty 2
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
## 2017-05-20 NOTE — ED Notes (Signed)
Pt talking to staff about "Shampoo" trying to poison her. Pt remains in room but still talking excessively loud about random things.

## 2017-05-20 NOTE — ED Notes (Signed)
Pt remains agitated walking in the hallway in front of the nurse's station.  Continues to curse and talk loudly about Rudene ChristiansKanye West and someone's niece poisoning her orange juice, planning something for her on Valentine's day, and a coach purse that's for her.  Pt's speech is loud, aggressive, and forced.  Sitter at bedside.  Criss AlvineGoldston, EDP and Lillia AbedLindsay [facility] made aware of her behavior.  Lillia AbedLindsay advises to inform EDP that pt needs something emergent to calm pt down.

## 2017-05-20 NOTE — ED Notes (Signed)
Pt states she can not take her med without eating dinner; pt was given Malawiurkey sandwich bag; Pt ate a large portion of bag however it took her over a hour to eat; pt continues to talk about Ann HeldKayne West and his mother. Pt still has not taken night meds
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
## 2017-05-20 NOTE — ED Notes (Signed)
Pt continues to yell out and make treats to staff. Pt states  "Y'all come at with me with a needle again I'm going to come back and kill all of y'all. I don't take librium. I don't take haldol. That's Danny boys medicine. Im going to blow this hospital up. Y'all gave me a shot for no damn reason. Chuckie buckie came down here and put on those [employer] Panther shoes. Guess who almost got killed? That woman there giving me that damn medicine. She killed me. I was dead but my son is the Messiah and he raised me up from the dead. Sallye OberLouise was dead. She died right here. But I said not tonight. But we praised and she raised up. I was so mad that day. Don't tell me to stay in my room. I own this hospital." Pt continues to rant and ramble be agitated.
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
## 2017-05-20 NOTE — ED Notes (Signed)
Lunch tray ordered for patient.

## 2017-05-20 NOTE — ED Notes (Signed)
Regular Diet ordered for Dinner. 

## 2017-05-20 NOTE — ED Notes (Signed)
"  The next person that gives me lithium I'm going to blow their brains out. I raised up every person that had breast cancer. I'm meticulous I need 3 cups of orange juice. One for me one for my Daddy one for that girl with the shampoo bowl. You know I don't take that lithium shit. I am who I am and the next time you give that to me I gonna blow your damn brains out. I love her but I was so mad at her."
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
## 2017-05-20 NOTE — ED Notes (Signed)
Patient ate 20% of her meal.

## 2017-05-20 NOTE — BH Assessment (Signed)
[facility] Assessment Progress Note  Pt reassessed this morning. Pt is extremely manic, delusional, tangential and hyperverbal. She talked for 6 minutes straight about nonsensical things, including in some homicidal threats (I'm gonna blow his/her/your brains out). Clinician had to interrupt pt to get her to stop her stream of tangentiality. As clinician was disconnecting from the telepsych machine, pt just focused her attention to presumably the sitter in the room and continued in her non-stop monologue. IP treatment is still warranted for pt. [facility].   Johny ShockSamantha M. Ladona Ridgelaylor, MS, NCC, LPCA Counselor

## 2017-05-20 NOTE — ED Notes (Signed)
Pt remains agitated, standing at the door talking non-stop.  Pt can be redirected.

## 2017-05-21 DIAGNOSIS — R451 Restlessness and agitation: Secondary | ICD-10-CM

## 2017-05-21 DIAGNOSIS — F419 Anxiety disorder, unspecified: Secondary | ICD-10-CM

## 2017-05-21 MED ORDER — HALOPERIDOL LACTATE 5 MG/ML IJ SOLN
5.0000 mg | Freq: Two times a day (BID) | INTRAMUSCULAR | Status: DC
  Filled 2017-05-21: qty 1

## 2017-05-21 MED ORDER — HALOPERIDOL 5 MG PO TABS
5.0000 mg | ORAL_TABLET | Freq: Two times a day (BID) | ORAL | Status: DC
  Administered 2017-05-21 – 2017-06-01 (×21): 5 mg via ORAL
  Filled 2017-05-21 (×23): qty 1

## 2017-05-21 MED ORDER — ARIPIPRAZOLE 5 MG PO TABS
15.0000 mg | ORAL_TABLET | Freq: Every day | ORAL | Status: DC
  Administered 2017-05-22 – 2017-06-01 (×11): 15 mg via ORAL
  Filled 2017-05-21 (×11): qty 1

## 2017-05-21 NOTE — ED Notes (Signed)
Pt lying on bed watching tv. Pt has been much more calm, cooperative thus far this shift. Pt has appeared to have rested well.

## 2017-05-21 NOTE — ED Notes (Signed)
Pt calls this RN - "Mama Becky" - states Charlett Langonniah, LaneFrankie, Chucky, Bubble Gum, and named a few other names were crying, she asked them why and states she told them Oscar LaMama Becky was coming and it would be OK. States "You know you are wearing my shoes but that's OK" - as she points toward RN's shoes. Pt continues to state she won the lottery and names a few states where she won it from.

## 2017-05-21 NOTE — ED Notes (Signed)
Patient is calm and sitting in bed watching TV. No loud outburst or talking since RN arrival to unit at  7 pm.

## 2017-05-21 NOTE — ED Notes (Signed)
Pt continues w/excessive delusional, circumstantial speech. Pt's voice noted to be lower tone. Pt w/excessive movement noted w/arms as she is talking. Pt remains sitting on bed.

## 2017-05-21 NOTE — ED Notes (Signed)
EKG given to Dr.Messick. 

## 2017-05-21 NOTE — ED Notes (Signed)
Re-TTS being performed.  

## 2017-05-21 NOTE — Consult Note (Signed)
[facility]   Reason for Consult:  Medication management Referring Physician:  EDP Patient Identification: Yvonne BalsamMelvine [name] MRN:  [id number] Principal Diagnosis: Bipolar I disorder, current or most recent episode manic, with psychotic features [facility](HCC) Diagnosis:   Patient Active Problem List   Diagnosis Date Noted  . Bipolar I disorder, current or most recent episode manic, with psychotic features (HCC) [F31.2]   . Bipolar affective disorder, current episode mild (HCC) [F31.9] 01/05/2017  . Manic behavior (HCC) [F30.10]   . Facial cellulitis [L03.211] 02/10/2014    Total Time spent with patient: 20 minutes  Subjective:   Yvonne [name] is a 51 y.o. female patient admitted with bipolar I disorder, who presented to ED for agitation, psychosis on 10.31. Consulted for medication management.   HPI:   Per nursing report, she has been relatively calmer since this morning. On first attempt to see this patient, patient was asleep; she slept total of more than six hours last night. She continues to be disorganized, but no significant behavioral concern.   Per chart review, she has been taking medication except she refused lithium last night. No IM given over 24 hours.   Patient is a very poor historian due to disorganization and tangentiality.  She is pleased to see this note Clinical research associatewriter, stating that she was waiting for a family. She feels "great" today. She randomly talks about some of her family members; talks about story of her father's childhood. She denies AH, VH. She denies SI, HI. She does not feel depressed.   Past Psychiatric History: see initial evaluation  Risk to Self: Suicidal Ideation: (UTA pt responding to internal stimuli) Suicidal Intent: (UTA pt responding to internal stimuli) Is patient at risk for suicide?: (UTA pt responding to internal stimuli) Suicidal Plan?: (UTA pt responding to internal stimuli) Access to Means: (UTA pt responding to internal  stimuli) What has been your use of drugs/alcohol within the last 12 months?: UTA pt responding to internal stimuli How many times?: (UTA pt responding to internal stimuli) Other Self Harm Risks: (UTA pt responding to internal stimuli) Triggers for Past Attempts: (UTA pt responding to internal stimuli) Intentional Self Injurious Behavior: (UTA pt responding to internal stimuli) Risk to Others: Homicidal Ideation: (UTA pt responding to internal stimuli) Thoughts of Harm to Others: (UTA pt responding to internal stimuli) Current Homicidal Intent: (UTA pt responding to internal stimuli) Current Homicidal Plan: (UTA pt responding to internal stimuli) Access to Homicidal Means: (UTA pt responding to internal stimuli) Identified Victim: (UTA pt responding to internal stimuli) History of harm to others?: (UTA pt responding to internal stimuli) Assessment of Violence: (UTA pt responding to internal stimuli) Violent Behavior Description: UTA pt responding to internal stimuli Does patient have access to weapons?: (UTA pt responding to internal stimuli) Criminal Charges Pending?: (UTA pt responding to internal stimuli) Does patient have a court date: (UTA pt responding to internal stimuli) Court Date: (UTA pt responding to internal stimuli) Prior Inpatient Therapy: Prior Inpatient Therapy: Yes Prior Therapy Dates: 12/2016 [facility] Reason for Treatment: psychosis Prior Outpatient Therapy: Prior Outpatient Therapy: (UTA pt responding to internal stimuli) Prior Therapy Dates: UTA(UTA pt responding to internal stimuli) Prior Therapy Facilty/Provider(s): UTA(UTA pt responding to internal stimuli) Reason for Treatment: UTA(UTA pt responding to internal stimuli) Does patient have an ACCT team?: Unknown(UTA pt responding to internal stimuli) Does patient have Intensive In-House Services?  : Unknown(UTA pt responding to internal stimuli) Does patient have [facility] services? :  Unknown(UTA pt responding to  internal stimuli) Does patient have P4CC services?: Unknown(UTA pt responding to internal stimuli)  Past Medical History:  Past Medical History:  Diagnosis Date  . Bipolar 1 disorder (HCC)   . Medical history non-contributory   . Mental disorder   . No pertinent past medical history     Past Surgical History:  Procedure Laterality Date  . NO PAST SURGERIES     Family History:  Family History  Problem Relation Age of Onset  . Depression Sister   . Depression Brother   . Depression Sister   . CAD Mother   . Hypertension Father   . Diabetes Father   . Bipolar disorder Cousin    Family Psychiatric  History: unable to obtain due to current mental state Social History:  Social History   Substance and Sexual Activity  Alcohol Use No     Social History   Substance and Sexual Activity  Drug Use No    Social History   Socioeconomic History  . Marital status: Single    Spouse name: None  . Number of children: None  . Years of education: None  . Highest education level: None  Social Needs  . Financial resource strain: None  . Food insecurity - worry: None  . Food insecurity - inability: None  . Transportation needs - medical: None  . Transportation needs - non-medical: None  Occupational History  . None  Tobacco Use  . Smoking status: Never Smoker  . Smokeless tobacco: Never Used  Substance and Sexual Activity  . Alcohol use: No  . Drug use: No  . Sexual activity: No  Other Topics Concern  . None  Social History Narrative  . None   Additional Social History:    Allergies:  No Known Allergies  Labs:  Results for orders placed or performed during the hospital encounter of 05/11/17 (from the past 48 hour(s))  Lithium level     Status: None   Collection Time: 05/20/17 11:05 AM  Result Value Ref Range   Lithium Lvl 0.86 0.60 - 1.20 mmol/L    Current Facility-Administered Medications  Medication Dose Route Frequency Provider  Last Rate Last Dose  . alum & mag hydroxide-simeth (MAALOX/MYLANTA) 200-200-20 MG/5ML suspension 30 mL  30 mL Oral Q6H PRN Street, [place], [place]      . [START ON 05/22/2017] ARIPiprazole (ABILIFY) tablet 15 mg  15 mg Oral Daily Irem Stoneham, MD      . benztropine (COGENTIN) tablet 1 mg  1 mg Oral BID Okonkwo, Justina A, NP   1 mg at 05/21/17 0858  . haloperidol (HALDOL) tablet 10 mg  10 mg Oral QHS Rankin, Shuvon B, NP       Or  . haloperidol lactate (HALDOL) injection 10 mg  10 mg Intramuscular QHS Rankin, Shuvon B, NP      . haloperidol (HALDOL) tablet 5 mg  5 mg Oral BID AC Rankin, Shuvon B, NP   5 mg at 05/21/17 1705   Or  . haloperidol lactate (HALDOL) injection 5 mg  5 mg Intramuscular BID AC Rankin, Shuvon B, NP      . hydrOXYzine (ATARAX/VISTARIL) tablet 25 mg  25 mg Oral Q6H PRN Street, [place], PA-C   25 mg at 05/20/17 0906  . lithium carbonate (LITHOBID) CR tablet 300 mg  300 mg Oral TID Rankin, Shuvon B, NP   300 mg at 05/21/17 1601  . nicotine (NICODERM CQ - dosed in mg/24 hours) patch 21 mg  21 mg Transdermal Daily  PRN Street, [place], [place]      . ondansetron [facility](ZOFRAN) tablet 4 mg  4 mg Oral Q8H PRN Street, [place]      . traZODone (DESYREL) tablet 50 mg  50 mg Oral QHS PRN Street, [place]   50 mg at 05/16/17 [id number]   Current Outpatient Medications  Medication Sig Dispense Refill  . ARIPiprazole (ABILIFY) 10 MG tablet Take 1 tablet (10 mg total) by mouth daily. 30 tablet 0  . carbamazepine (TEGRETOL XR) 200 MG 12 hr tablet Take 1 tablet (200 mg total) by mouth 2 (two) times daily. For mood stabilization 60 tablet 0  . hydrOXYzine (ATARAX/VISTARIL) 25 MG tablet Take 1 tablet (25 mg total) by mouth every 6 (six) hours as needed for anxiety. 60 tablet 0  . OLANZapine zydis (ZYPREXA) 15 MG disintegrating tablet Take 1 tablet (15 mg total) by mouth 2 (two) times daily. 60 tablet 0  . propranolol (INDERAL) 10 MG tablet Take 1 tablet (10 mg total) by mouth 2 (two) times  daily. For anxiety 60 tablet 0  . traZODone (DESYREL) 50 MG tablet Take 1 tablet (50 mg total) by mouth at bedtime as needed for sleep. 30 tablet 0    Musculoskeletal: Strength & Muscle Tone: within normal limits Gait & Station: lying in the bed Patient leans: N/A  Psychiatric Specialty Exam: Physical Exam  ROS  Blood pressure (!) 94/49, pulse 74, temperature 98.4 F (36.9 C), temperature source Oral, resp. rate 18, height 5\' 3"  (1.6 m), weight 98 lb (44.5 kg), SpO2 100 %.Body mass index is 17.36 kg/m.  General Appearance: Disheveled  Eye Contact:  Good  Speech:  pressured, rapid  Volume:  Increased  Mood:  "great"  Affect:  bright  Thought Process:  Disorganized  Orientation:  Full (Time, Place, and Person)  Thought Content:  no paranoia Perceptions: denies AH/VH  Suicidal Thoughts:  No  Homicidal Thoughts:  No  Memory:  Immediate;   unable to assess due to current mental status Recent;   unable to assess due to current mental status Remote;   unable to assess due to current mental status  Judgement:  Impaired  Insight:  Lacking  Psychomotor Activity:  Increased  Concentration:  Concentration: Poor and Attention Span: Poor  Recall:  unable to assess due to current mental status  Fund of Knowledge:  unable to assess due to current mental status  Language:  Good  Akathisia:  No  Handed:  Right  AIMS (if indicated):   no tremors, no rigidity  Assets:  Physical Health  ADL's:  Intact  Cognition:  WNL unable to assess due to current mental status  Sleep:   fair    Assessment Yvonne BalsamMelvine [name] is a 51 y.o. female patient admitted with bipolar I disorder, who presented to ED for agitation, psychosis on 10.31. Psychiatry is consulted for medication management. [facility]   # Bipolar I disorder Exam is notable for rapid and pressured speech with marked disorganization, although there appears to be some improvement in aggression/behavioral issues since  admission per staff/chart review. Will uptitrate Abilify while monitoring EPS. Noted that patient is on two antipsychotics given severity of her symptoms. Will continue lithium; level 0.86 on 11/9 while patient takes this medication on and off. Consider repeating another level on 11/14. Will order TSH.   Plan - Increase Abilify 15 mg daily  - Continue Haldol 5 mg BID and 10 mg qhs - Continue cogentin 1 mg BID -  Continue lithium 300 mg TID  - Continue hydroxyzine 25 mg q6hprn for anxiety  - Check TSH - Obtain EKG for QTc prolongation  Treatment Plan Summary: Plan as above  Disposition: Recommend psychiatric Inpatient admission when medically cleared.  Neysa Hotter, MD 05/21/2017 6:17 PM

## 2017-05-21 NOTE — ED Notes (Signed)
Lying on bed watching tv. 

## 2017-05-21 NOTE — ED Notes (Signed)
Pt continues w/delusional speech.

## 2017-05-21 NOTE — ED Notes (Signed)
Pt woke ambulated to bathroom and back to room w/o difficulty - took med then laid back down on bed.

## 2017-05-21 NOTE — [facility] Counselor (Signed)
Patient re-assessed this morning. Patient continue to speak tangential and hyperverbal. Patient report today was her birthday and she turning 51 year old. Patient voice is 'horse'. Patient discussed she is losing her voice due to all the parting she done last night.  [facility]

## 2017-05-22 LAB — TSH: TSH: 0.965 u[IU]/mL (ref 0.350–4.500)

## 2017-05-22 NOTE — ED Notes (Signed)
Re-TTS completed.  

## 2017-05-22 NOTE — ED Notes (Addendum)
Pt noted w/excessive, rapid, circumstantial, repetitive speech - stating she won the lottery and that is why she was fired from [employer]. States the nurse is wearing her shoes. Pt remains sitting on bed. Stating to Apple ComputerSitter "You need to wear your diamond ring. I paid $25 million for that thing. Don't leave the house w/o it". States Jeffie PollockBurney Mack is her brother. States she will kill anybody that tries to break into her house.

## 2017-05-22 NOTE — ED Notes (Signed)
Dinner tray delivered to pt - Pt refusing tray - stating "This is not what I ordered!" Pt stating she ordered collard greens, Malawiturkey, and other items. Advised pt those items are not available today. Pt then stated "Then I'm going to need you to order me something from [employer]!" Pt given apple sauce.

## 2017-05-22 NOTE — ED Notes (Signed)
Pt continues talking w/Sitter - continues w/delusions. Pt sitting on bed in room.

## 2017-05-22 NOTE — [facility] Counselor (Signed)
Pt re-assessed this AM.  She remains floridly psychotic.  Pt reported that she was going to make it snow, that she won the [employer] lottery, that she wanted author to come come over to play cards and eat the food she had prepared, that she had clothing she wanted to give away.  Recommend continued inpatient.

## 2017-05-22 NOTE — ED Notes (Signed)
Pt sitting on her bed talking w/Sitter. Pt continues w/excessive speech.

## 2017-05-22 NOTE — ED Notes (Signed)
Nurse will collect blood at 0500

## 2017-05-23 NOTE — Progress Notes (Signed)
[facility]    Baldo DaubJolan Marialuiza Car MSW, LCSWA CSW Disposition [phone]

## 2017-05-23 NOTE — ED Notes (Signed)
Pt eating lunch tray at this time  

## 2017-05-23 NOTE — BH Assessment (Signed)
[facility] Assessment Progress Note  Pt reassessed today. Pt continues to be floridly psychotic-tangential, pressured speech and delusional thought processes. IP treatment still recommended.   Johny ShockSamantha M. Ladona Ridgelaylor, MS, NCC, LPCA Counselor

## 2017-05-23 NOTE — ED Notes (Signed)
Pt states she did not want to eat breakfast because she is "so full from last night".

## 2017-05-23 NOTE — ED Notes (Signed)
Pt in TTS interview at this time. 

## 2017-05-24 LAB — LITHIUM LEVEL: LITHIUM LVL: 1.84 mmol/L — AB (ref 0.60–1.20)

## 2017-05-24 MED ORDER — LITHIUM CARBONATE ER 300 MG PO TBCR
300.0000 mg | EXTENDED_RELEASE_TABLET | Freq: Two times a day (BID) | ORAL | Status: DC
  Administered 2017-05-24 – 2017-06-01 (×16): 300 mg via ORAL
  Filled 2017-05-24 (×18): qty 1

## 2017-05-24 NOTE — ED Notes (Signed)
Pt. Took the Haldol

## 2017-05-24 NOTE — ED Notes (Signed)
Attempted to resubmit IVC paperwork d/t paperwork expiring. IVC paperwork denied 4 times d/t not having enough information. Charge nurse and MD notified.

## 2017-05-24 NOTE — ED Notes (Signed)
Notified Dr. Adela LankFloyd of elevated Lithium level (1.84). [facility] also notified of critical lab, will notify the pt's psychiatrist and AM dose to be held.

## 2017-05-24 NOTE — ED Notes (Signed)
Patient was given a snack and Drink, A Regular Diet ordered for Lunch.

## 2017-05-24 NOTE — ED Notes (Signed)
Pt states that this hospital is her home and we are living in it w/ her.

## 2017-05-24 NOTE — [facility] Counselor (Signed)
Pt is cooperative and talkative during reassessment. She reports she slept well and ate breakfast. She continues to be delusional. Pt talks a lot about [place] [facility] and her concern for everyone in storm's path. She says she is worried about her family and she tells Clinical research associatewriter that Clinical research associatewriter is part of pt's family. 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  Pt says she has won pots (lotteries) all over the country. Leighton Ruffina Okonkwo NP recommends inpatient treatment for pt.   Yvonne Cristalaroline Paige Toini Failla, [place]CS[place] Therapeutic Triage Specialist

## 2017-05-24 NOTE — Progress Notes (Signed)
Lithium dose adjusted per critical lab result of 1.84.  Plan: Decrease and change Lithium from 300 mg PO TID to Lithium 300 mg P.O BID (0800:2000). Recheck lab in 3-5 days.   Signed: Ferne ReusJustina Okonkwo, NP

## 2017-05-24 NOTE — Care Management (Signed)
Patient's Lithium dosage adjustment today [facility]. [facility] to [facility].

## 2017-05-24 NOTE — ED Notes (Signed)
Pt. Refused the Haldol

## 2017-05-24 NOTE — ED Provider Notes (Signed)
Pt's IVC papers expire tonight, so I redid them.  The [facility].  Lithium adjusted by Ferne ReusJustina Okonkwo, NP.  The pt is currently resting comfortably in her room.     Jacalyn LefevreHaviland, Otis Burress, MD 05/24/17 [phone]

## 2017-05-24 NOTE — ED Notes (Signed)
Regular Diet has been ordered for Dinner. 

## 2017-05-24 NOTE — Progress Notes (Signed)
Updated IVC paperwork (05/24/17-05/30/17) sent to [facility] Intake/Admissions ([phone]).  [name] [name], MSW, LCSWA Disposition Clinical Social Work [phone] (cell) [phone] (office)

## 2017-05-25 NOTE — [facility] Counselor (Signed)
Pt remains in the ED while TTS continues placement efforts.  Pt continues to be floridly psychotic, stating that she is in her hospital home, that [place] large [place] [place], and that we (TTS) should come stay with her so we can be safe.  Recommend continued inpatient.

## 2017-05-25 NOTE — Discharge Planning (Signed)
[facility] aiding pt sister with completing Medicaid application.  [facility] with this process. Will refer to DSS.

## 2017-05-25 NOTE — ED Provider Notes (Signed)
Patient resting comfortably.  Alert Glasgow Coma Score 15 denies complaint.  Patient noted to be mildly lithium toxic yesterday.  I have asked that lithium dose be withheld and have consulted pharmacy to adjust lithium dose   Yvonne [name], Yvonne Skilling, MD 05/25/17 [phone]

## 2017-05-25 NOTE — Progress Notes (Signed)
Pharmacy Consult:  Lithium Dosing   51 yof continuing on lithium for severe bipolar disorder with psychosis. Lithium level was toxic level at 1.84 on 11/13 AM - drawn appropriately (wnl on 11/9), and dose was adjusted per Psychiatry NP from Lithium 300mg  PO TID to 300mg  PO BID (no held doses) with follow-up level to be rechecked in 3-5 days.  EDP consulted Pharmacy to help manage lithium dosing on 11/4. Patient has not missed any doses. BMET/CBC ok and TSH wnl this admit. No lithium toxicity symptoms specifically noted in documentation.   Plan: Continue reduced lithium dose 300mg  PO BID per Psychiatry orders Re-check level in 3-5 days per Psych recs Monitor renal function, electrolytes, weight changes - will recheck BMET in AM   Babs BertinHaley Jamilett Ferrante, PharmD, BCPS Clinical Pharmacist 05/25/2017 9:47 AM

## 2017-05-25 NOTE — ED Notes (Addendum)
Patient talking of unrealistic things (ex. "I gave birth last week to 9000 babies out of one hole. I look good for having that many babies, don't I? And they were all healthy. 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

## 2017-05-25 NOTE — Progress Notes (Addendum)
CSW received call from pt's sister Birdie RiddleHazel Nelson. Pt's sister is seeking information on pt's  medication so that she can fill out Medicaid application for pt. CSW informed Jerrye BeaversHazel that she would have RN CM call back to discuss this with her. CSW signing off.     Claude MangesKierra S. Jaicey Sweaney, MSW, LCSW-A Emergency Department Clinical Social Worker [phone]

## 2017-05-25 NOTE — Discharge Planning (Signed)
Per [facility]:  Pt reassessed this AM. Continues to be floridly psychotic. Recommend continued inpatient care.

## 2017-05-26 LAB — BASIC METABOLIC PANEL
Anion gap: 4 — ABNORMAL LOW (ref 5–15)
BUN: 8 mg/dL (ref 6–20)
CALCIUM: 9.6 mg/dL (ref 8.9–10.3)
CO2: 27 mmol/L (ref 22–32)
CREATININE: 0.8 mg/dL (ref 0.44–1.00)
Chloride: 109 mmol/L (ref 101–111)
GFR calc Af Amer: >60 mL/min (ref >60)
GLUCOSE: 88 mg/dL (ref 65–99)
Potassium: 3.4 mmol/L — ABNORMAL LOW (ref 3.5–5.1)
Sodium: 140 mmol/L (ref 135–145)

## 2017-05-26 NOTE — ED Notes (Signed)
Pt ambulated to bathroom w/ a steady gait.

## 2017-05-26 NOTE — ED Notes (Signed)
Went to give pt her 8 am meds, pt became angry states was poison and she is not taking then, Lithium and haldol, states they are meds for pt next door NOT her, Pt angry shaking fists but calmed when I said OK and that I would talk to pharmacy

## 2017-05-26 NOTE — [facility] Counselor (Signed)
Reassessment- Pt has tangential speech. Pt had flight of ideas.  Pt denies SI/HI and AVH.  Pt remains on TTS wait list.  Wolfgang PhoenixBrandi Aylene Acoff, [facility] Triage Specialist

## 2017-05-26 NOTE — ED Notes (Signed)
Pt ambulated to bathroom with steady gait. 

## 2017-05-26 NOTE — ED Notes (Signed)
TTS being done 

## 2017-05-26 NOTE — ED Notes (Signed)
Pt ate lunch , pt dozing

## 2017-05-26 NOTE — ED Notes (Signed)
Pt sitting up in bed stating "i've been dizzy since my dad died"

## 2017-05-26 NOTE — ED Notes (Signed)
Pt ended up taking Haldol that was ordered at 8 am with her 1000 meds

## 2017-05-27 NOTE — Progress Notes (Signed)
CSW and RN CM [facility] and confirmed [facility]. [facility] continues to follow for placement at this time.     Claude MangesKierra S. Ashutosh Dieguez, MSW, LCSW-A Emergency Department Clinical Social Worker [phone]

## 2017-05-27 NOTE — ED Notes (Addendum)
Pt once again became very agitated when RN walked into the room.  She did take the OJ that she had requested.  Once RN requested that she take the medications she became agitated, yelling and stating that I was trying to kill her.  RN explained that these were the same medications she had taken earlier.  She made a movement as if she was going to throw the OJ on RN but she stopped herself.  She became so agitated that security was called.  It had been reported that she works better w/ female staff, they requested that she take her medication, she again refused.  RN prepared PRN IM medication, once pt saw the medications she agreed to take the pills, still continuing to yell and express delusions such as she owns the hospital.    Security left and pt appeared to calm down.  Will continue to monitor.

## 2017-05-27 NOTE — ED Notes (Signed)
RN attempted to give pt lithium however she got very agitated "I've told you a thousand times... I don't take that stuff."  RN left room w/ pt continuing to speak very fast but at a decent volume about "not taking that stuff."

## 2017-05-27 NOTE — ED Notes (Signed)
Pt up to restroom. Steady gait. Cooperative. Back to bed without incident.

## 2017-05-27 NOTE — ED Notes (Signed)
Await breakfast

## 2017-05-27 NOTE — ED Notes (Signed)
Regular diet ordered for Lunch. 

## 2017-05-27 NOTE — ED Notes (Signed)
When RN offered patient medications at approximately 2120 patient refused and became irate and began yelling and cursing at [employer].  EMT called for security because patient threatened RN by stating that she was going to "kill that bxxx".  Security arrived and patient still refused medications and repeated the same threats directed to RN.  Security again reminded her that she would be getting a shot unless she took the medications, after several attempts, patient finally requested the pills she had initially declined. Patient cursing and yelling at all staff at this point, but finally took her medication.

## 2017-05-27 NOTE — ED Notes (Signed)
Regular Diet was ordered for Dinner. 

## 2017-05-27 NOTE — [facility] Counselor (Signed)
Re-assessment:   Patient continues to speak in tangential discussing cooking a big fest for Thanksgiving and the food was ready.   [facility] to be [facility] the waiting for [facility].

## 2017-05-27 NOTE — ED Notes (Signed)
Pt states that she does not want to bathe today

## 2017-05-27 NOTE — ED Notes (Signed)
Patient was given a Cup of Hot Chocolate.

## 2017-05-27 NOTE — ED Notes (Signed)
Breakfast tray ordered 

## 2017-05-28 LAB — BASIC METABOLIC PANEL
Anion gap: 6 (ref 5–15)
BUN: 11 mg/dL (ref 6–20)
CO2: 26 mmol/L (ref 22–32)
CREATININE: 0.8 mg/dL (ref 0.44–1.00)
Calcium: 9.7 mg/dL (ref 8.9–10.3)
Chloride: 105 mmol/L (ref 101–111)
Glucose, Bld: 92 mg/dL (ref 65–99)
Potassium: 3.7 mmol/L (ref 3.5–5.1)
SODIUM: 137 mmol/L (ref 135–145)

## 2017-05-28 LAB — LITHIUM LEVEL: LITHIUM LVL: 1.15 mmol/L (ref 0.60–1.20)

## 2017-05-28 MED ORDER — ENSURE ENLIVE PO LIQD
237.0000 mL | Freq: Four times a day (QID) | ORAL | Status: DC
  Administered 2017-05-28 – 2017-05-29 (×3): 237 mL via ORAL
  Filled 2017-05-28 (×8): qty 237

## 2017-05-28 NOTE — ED Notes (Signed)
Pt continues w/flight of ideas, confabulations, and grandiose speech. States she rose Donn PieriniMichael Jackson and [place] IIPrince from the dead. States [place] owns [place] [employer]. States she would go there but she doesn't have a car right now. Then states she is going to buy her a "[employer] - a $50 million car". Pt noted to be calm and cooperative thus far this shift. But also continues to state she is not taking any Haldol or Lithium.

## 2017-05-28 NOTE — ED Provider Notes (Signed)
Patient currently awake, and content appearing.    Patient continues to have delusional and paranoid thoughts/acute psychosis.   Vitals:   05/27/17 2116 05/28/17 0637  BP: 114/61 94/61  Pulse: 98 64  Resp: 16 16  Temp: 99.4 F (37.4 C) 98.9 F (37.2 C)  SpO2: 99% 100%   BH team working on inpatient psychiatric placement.      Cathren LaineSteinl, Camille Dragan, MD 05/28/17 1009

## 2017-05-28 NOTE — ED Notes (Signed)
Dr Fayrene FearingJames aware pt noted to be weak and shaking when stands and ambulates and aware pt's appetite has been decreasing. Aware pt states she will drink Ensure - order received.

## 2017-05-28 NOTE — ED Notes (Signed)
Pt talking w/Sitter.  

## 2017-05-28 NOTE — Progress Notes (Signed)
[facility] Currently under IVC which will need to be renewed 05/30/17 if pt still meeting criteria at that time.  Ilean SkillMeghan Dakota Stangl, MSW, LCSW Clinical Social Work 05/28/2017 [phone]

## 2017-05-28 NOTE — BHH Counselor (Signed)
TTS reassessment note: Pt was calm and cooperative. She was upset because she states that the night shift nurse "forced her to take haldol and she doesn't take medication". Pt states that she only takes "abilify and a vitamin and she doesn't need anything else". Pt states that she "doesn't know what she is going to do if that nurse tries to give her haldol tonight". [place] "[place] is my momma". Pt states that she is only here because she "owns the hospital". Pt denies SI, HI and AVH. Still exhibiting delusional thinking and flight of ideas. Pt meets inpatient criteria [facility]  [facility] CenterKristin Ross Bender LPC, LCAS

## 2017-05-28 NOTE — ED Notes (Signed)
Pt woke up - asking for orange juice - given.

## 2017-05-28 NOTE — ED Notes (Signed)
Lab to draw labs

## 2017-05-29 LAB — CBC
HCT: 33.2 % — ABNORMAL LOW (ref 36.0–46.0)
Hemoglobin: 10.7 g/dL — ABNORMAL LOW (ref 12.0–15.0)
MCH: 27.9 pg (ref 26.0–34.0)
MCHC: 32.2 g/dL (ref 30.0–36.0)
MCV: 86.5 fL (ref 78.0–100.0)
PLATELETS: 284 10*3/uL (ref 150–400)
RBC: 3.84 MIL/uL — AB (ref 3.87–5.11)
RDW: 13.8 % (ref 11.5–15.5)
WBC: 4.7 10*3/uL (ref 4.0–10.5)

## 2017-05-29 LAB — BASIC METABOLIC PANEL
Anion gap: 6 (ref 5–15)
BUN: 10 mg/dL (ref 6–20)
CALCIUM: 9.7 mg/dL (ref 8.9–10.3)
CHLORIDE: 105 mmol/L (ref 101–111)
CO2: 25 mmol/L (ref 22–32)
CREATININE: 0.74 mg/dL (ref 0.44–1.00)
Glucose, Bld: 117 mg/dL — ABNORMAL HIGH (ref 65–99)
Potassium: 3.8 mmol/L (ref 3.5–5.1)
SODIUM: 136 mmol/L (ref 135–145)

## 2017-05-29 LAB — LITHIUM LEVEL: LITHIUM LVL: 1.09 mmol/L (ref 0.60–1.20)

## 2017-05-29 NOTE — ED Notes (Signed)
Phlebotomist in w/pt.  

## 2017-05-29 NOTE — ED Notes (Signed)
Pt tolerated bloodwork being drawn well.

## 2017-05-29 NOTE — ED Notes (Signed)
[facility] aware pt w/reduced appetite, weight loss of 11 lbs - pt c/o generalized weakness - advised will notify NP, [facility].

## 2017-05-29 NOTE — ED Notes (Signed)
PT in w/pt.  

## 2017-05-29 NOTE — ED Notes (Signed)
Patient up to bathroom. Sitter at side.

## 2017-05-29 NOTE — BHH Counselor (Signed)
Reassessment- Pt denies SI/HI and AVH. Pt's speech was tangential and had flight of ideas. Pt's state "I have won all of the lotteries. I have so much money I don't know what to do with it."  Becky, RN states that the Pt has lost 11lbs. EDP will complete additional labs. NP will be advised of weight loss and will review mental health medication.  Pt continues to be on [facility].  Yvonne [name], [facility] Triage Specialist

## 2017-05-29 NOTE — ED Notes (Signed)
Patient provided peanut butter and jelly sandwich of which she consumed half. States it was good and she is now full

## 2017-05-29 NOTE — ED Notes (Signed)
Re-TTS being performed.  

## 2017-05-29 NOTE — Evaluation (Signed)
Physical Therapy Evaluation Patient Details Name: Yvonne BalsamMelvine Ferrer MRN: [id number] DOB: [birth year] Today's Date: 05/29/2017   History of Present Illness  51 y.o. female patient admitted with bipolar I disorder, who presented to ED for agitation, psychosis on 05-11-17.  Clinical Impression  PT eval complete. Pt is supervision to min guard assist due to safety for all functional mobility including ambulation 150 feet.  No physical assist needed.  See below for further details. On initial approach, pt refusing to participate in PT eval. With minimal encouragement from RN, pt agreeable. Recommend pt continue to mobilize on unit with nursing staff assist. [facility] No further PT intervention indicated. PT signing off.    Follow Up Recommendations No PT follow up;Supervision/Assistance - 24 hour    Equipment Recommendations  None recommended by PT    Recommendations for Other Services       Precautions / Restrictions Precautions Precautions: None      Mobility  Bed Mobility Overal bed mobility: Needs Assistance Bed Mobility: Supine to Sit;Sit to Supine     Supine to sit: Supervision Sit to supine: Supervision   General bed mobility comments: supervision for safety. Pt with mild trembling during initial transition to sitting EOB. Trembling cleared in < 30 seconds.  Transfers Overall transfer level: Needs assistance   Transfers: Sit to/from Stand Sit to Stand: Min guard         General transfer comment: min guard assist for safety only  Ambulation/Gait Ambulation/Gait assistance: Min guard Ambulation Distance (Feet): 150 Feet Assistive device: 1 person hand held assist Gait Pattern/deviations: Step-through pattern;Decreased stride length Gait velocity: decreased Gait velocity interpretation: Below normal speed for age/gender General Gait Details: slow, steady gait. Hand held min guard assist for safety.  Stairs            Wheelchair  Mobility    Modified Rankin (Stroke Patients Only)       Balance Overall balance assessment: Needs assistance Sitting-balance support: No upper extremity supported;Feet supported Sitting balance-Leahy Scale: Good     Standing balance support: No upper extremity supported;During functional activity Standing balance-Leahy Scale: Fair                               Pertinent Vitals/Pain Pain Assessment: No/denies pain    Home Living Family/patient expects to be discharged to:: Unsure                 Additional Comments: Unsure of pt's living arrangements PTA.    Prior Function Level of Independence: Independent         Comments: Independent with mobility. Ambulatory. Pt is a poor historian. No further history available.     Hand Dominance        Extremity/Trunk Assessment   Upper Extremity Assessment Upper Extremity Assessment: Generalized weakness    Lower Extremity Assessment Lower Extremity Assessment: Generalized weakness    Cervical / Trunk Assessment Cervical / Trunk Assessment: Normal  Communication   Communication: Other (comment)(tangential, grandeous speech. Easily agitated.)  Cognition Arousal/Alertness: Awake/alert Behavior During Therapy: Anxious Overall Cognitive Status: Impaired/Different from baseline                                 General Comments: Pt is delusional and tangential in speech. Disorganized in behavior and thought process.  Making random statements during assessment: "This is my [place] day." "  I have been dizzy since my daddy died." "I've won the lottery in every state." Pt is easily agitated.       General Comments      Exercises     Assessment/Plan    PT Assessment Patent does not need any further PT services  PT Problem List         PT Treatment Interventions      PT Goals (Current goals can be found in the Care Plan section)  Acute Rehab PT Goals Patient Stated Goal:  unable to state PT Goal Formulation: All assessment and education complete, DC therapy    Frequency     Barriers to discharge        Co-evaluation               AM-PAC PT "6 Clicks" Daily Activity  Outcome Measure Difficulty turning over in bed (including adjusting bedclothes, sheets and blankets)?: None Difficulty moving from lying on back to sitting on the side of the bed? : A Little Difficulty sitting down on and standing up from a chair with arms (e.g., wheelchair, bedside commode, etc,.)?: A Little Help needed moving to and from a bed to chair (including a wheelchair)?: None Help needed walking in hospital room?: None Help needed climbing 3-5 steps with a railing? : A Little 6 Click Score: 21    End of Session   Activity Tolerance: Patient tolerated treatment well Patient left: in bed;with nursing/sitter in room Nurse Communication: Mobility status PT Visit Diagnosis: Unsteadiness on feet (R26.81);Muscle weakness (generalized) (M62.81)    Time: [id number] PT Time Calculation (min) (ACUTE ONLY): 11 min   Charges:   PT Evaluation $PT Eval Moderate Complexity: 1 Mod     PT G Codes:   PT G-Codes **NOT FOR INPATIENT CLASS** Functional Assessment Tool Used: AM-PAC 6 Clicks Basic Mobility Functional Limitation: Mobility: Walking and moving around Mobility: Walking and Moving Around Current Status (W4132(G8978): At least 20 percent but less than 40 percent impaired, limited or restricted Mobility: Walking and Moving Around Goal Status [phone](G8979): At least 20 percent but less than 40 percent impaired, limited or restricted Mobility: Walking and Moving Around Discharge Status [phone](G8980): At least 20 percent but less than 40 percent impaired, limited or restricted    Aida RaiderWendy Terrisha Lopata, PT  Office # [phone] Pager [phone]   Ilda FoilGarrow, Tadhg Eskew Rene 05/29/2017, 2:25 PM

## 2017-05-29 NOTE — ED Notes (Signed)
Pt ambulating to the bathroom with sitter with a steady gait at this time.

## 2017-05-29 NOTE — ED Notes (Signed)
Ambulated w/pt in hallway x 2 - pt tolerated well. Pt declines to shower x 2.

## 2017-05-29 NOTE — ED Notes (Addendum)
Patient refused Ensure supplement. Says she is tired of drinking them. Patient offered something else but said she might eat her pizza later, and wanted it to stay at her bedside in case her niece wanted some.

## 2017-05-29 NOTE — Progress Notes (Signed)
Received a request to review patient's psych medications in relation to 11 lbs weight loss in 18 days, since admission in 05/11/17.   It appears that patient is not currently on any psych medication that has the potential of causing that significant amount of weight loss in such a short period of time.   I'm not sure how much patient is feeding given her psychotic state of mind. Also, patient's hemoglobin is low (9.0), RBC (3.26), HCT (28.1) as well as her albumin (2.7). It's possible that patient might be bleeding somewhere and there is no hemoccult result on patient's file. EDP to consider doing a detail workup on patient and seek further consults as necessary.    Signed: Ferne ReusJustina Okonkwo NP

## 2017-05-29 NOTE — ED Notes (Signed)
Toniann FailWendy, PT, aware of order request for PT consult.

## 2017-05-30 NOTE — ED Notes (Signed)
This RN attempted to go in to the room to give patient Haldol pill, patient became agitated and refusing at this time.  Will seek security assistance in taking injection

## 2017-05-30 NOTE — ED Triage Notes (Signed)
Pt reports she cannot eat because she is full from breakfast . Pt is drinking the ice tea.

## 2017-05-30 NOTE — ED Triage Notes (Signed)
Pt 's  IVC paper work expires on 05-31-17.

## 2017-05-30 NOTE — BH Assessment (Signed)
[facility] Assessment Progress Note  Pt reassessed this morning. Pt was calm and cooperative, as she had just woken up. Pt denied SI, HI, AVH. When asked if she was taking her medications, pt said, "I don't have any medications. I have a verifier."  It seems that pt may be starting to mentally clear up. It is unsure if it's just b/c pt was just woken up and still sleepy. TTS will continue to review pt for improvement and stability. [facility]   Johny ShockSamantha M. Ladona Ridgelaylor, MS, NCC, LPCA Counselor

## 2017-05-30 NOTE — ED Triage Notes (Signed)
Pt  Reported she did not want the  Ensure  At 1000.

## 2017-05-30 NOTE — ED Notes (Signed)
Ordered dinner tray for patient.

## 2017-05-30 NOTE — ED Notes (Signed)
Patient asking for peanut butter and jelly sandwich.  Sitter informed her we only have [place] this late at night.  Pt refused.

## 2017-05-30 NOTE — ED Notes (Signed)
Pt ambulated to restroom.  Gait steady and even.   

## 2017-05-30 NOTE — ED Triage Notes (Signed)
Pt reported to sitter she would eat some frosted flakes. Frosted flakes ordered but Pt only ate a small amount.

## 2017-05-30 NOTE — ED Notes (Signed)
With assistance of sitter, patient agreed to take pill.  This RN attempted to calmly explain to patient circumstances of pill delivery and need to provide medications, patient threatening this RN, stating she is going to "slap you" and called this RN multiple names.  Patient states she does not want this RN going back in to her room.  This RN gave patient space after medication delivery and will continue to monitor situation

## 2017-05-30 NOTE — ED Triage Notes (Signed)
TTS - Yvonne [name]  Called  To asses PT . Pt sat up in bed and [facility] with [facility].

## 2017-05-30 NOTE — ED Triage Notes (Signed)
PT ate a small amount of her breakfast . Pt wanted the tray to stay in her room,because she wanted to eat later.

## 2017-05-30 NOTE — ED Triage Notes (Signed)
Lunch ordered 

## 2017-05-30 NOTE — ED Triage Notes (Signed)
Pt ambulated to bath room . Sitter with PT.

## 2017-05-30 NOTE — ED Notes (Signed)
This RN went in to offer the patient an Ensure.  Patient refused and began speaking about how she has won the lottery in all [place] states, and she needs a wad of cash from this RN to put in her pocket from her lottery winnings.

## 2017-05-30 NOTE — ED Notes (Signed)
Dinner tray delivered to patients room. 

## 2017-05-31 DIAGNOSIS — R450 Nervousness: Secondary | ICD-10-CM

## 2017-05-31 DIAGNOSIS — R443 Hallucinations, unspecified: Secondary | ICD-10-CM

## 2017-05-31 DIAGNOSIS — F22 Delusional disorders: Secondary | ICD-10-CM

## 2017-05-31 NOTE — ED Notes (Signed)
Encouraged pt to ambulate in hallway - declined,.

## 2017-05-31 NOTE — ED Notes (Signed)
Encouraged pt to drink Ensure - declines.

## 2017-05-31 NOTE — ED Notes (Signed)
Pt refused to shower and initially refused to bathe using sink in room. Pt then was compliant and allowed Sitter to assist.

## 2017-05-31 NOTE — ED Notes (Signed)
Pt continues to state she won the lottery but isn't sure how she is to get the money. States she won $40 million.

## 2017-05-31 NOTE — Progress Notes (Signed)
ED CM discussed patient transitional care plan with ED CSW, patient is  known to CSW, she states this is patient's baseline. [facility] Patient denies any SI,HI at this time. Medication adjustments done last week. Patient is reported to be calm and cooperative.  Discussed with Medical Advisor Dr. Jacky KindleAronson. Recommendation is reevaluation tomorrow. CSW will follow up in the am.

## 2017-05-31 NOTE — Consult Note (Signed)
Telepsych Consultation   Reason for Consult: For possible D/C Referring Physician: EDP Location of Patient: [facility] ED Location of Provider: [facility]  Patient Identification: Yvonne [name] MRN:  [id number] Principal Diagnosis: Bipolar I disorder, current or most recent episode manic, with psychotic features [facility]) Diagnosis:   Patient Active Problem List   Diagnosis Date Noted  . Bipolar I disorder, current or most recent episode manic, with psychotic features ([place]) [F31.2]   . Bipolar affective disorder, current episode mild ([place]) [F31.9] 01/05/2017  . Manic behavior ([place]) [F30.10]   . Facial cellulitis [C12.751] 02/10/2014    Total Time spent with patient: 30 minutes  Subjective:   Yvonne [name] is a 51 y.o. female patient admitted with psychosis.  HPI: Per the TTS assessment Yvonne [name] is an 51 y.o. female. Patient was brought in by [facility], petitioned by court liaison. TTS attempted to assess patient, patient displaying rapid pressured speech, rambling and loose associations. Patient spoke nonstop with loose associations. Patient made reference to counselor gathering up all objects on counter at nurses station to give to her cousin. Patient stated that counselor was batman's twin. Patient continued to make reference to unknown people and events. Patient UDS was negative.  Per IVC, "Respondent is diagnosed with anxiety, depression, bi-polar. Takes zyprex medication. She was committed for mental health issues May 2018, she is now acting aggressive toward jail and medical staff, she is refusing medical treatment and refusing to eat and take prescribed medication, she has a history of suicidal ideations, she is hallucinating, thinks she is working at school for kids, proving them with clothes, flushing linens down the toilet, she has lost thirty pounds in a short time due to refusing to eat and take medications, she refuses to wear clothes, or pratice hygiene. She is unable to  fulfill her basic life needs at this time (food, clothing, shelter), having extreme behavior issues, she is a danger to herself and possibly others at this time."  05/14/17 reassessment:  Patient was seen for a re-assessment.  Patient continues to be delusional with loose thought associations and rambling.  Patient appears to know she is in the hospital, but disoriented to why she is in the hospital.  Patient continued to refer to "Chuckie" and was calling the TTS Counselor "Daddy."  Her conversation was incoherent and made no logical sense.  She was very animated and jumping around on the bed and acting very bizarre.  She was very disorganized and disheveled. Patient continues to be severely psychotic and in need of inpatient psych treatment.   05/16/17 reassessment  This clinician spoke with the patient who believes she is at [facility]. States she is in her husbands house. Called this clinician "Lexine Baton" states that is my nickname. Then said Sharlett Iles is my daddy. Report she will crack my neck. Continued to reference the [employer] homecoming parade and having babies while in the ED. When asked about the month of the year the patient reports,  "October, November, December, its snowing."  Did not know the date, year, place or person. The patient stood through out the assessment, put her face up to the telepsych machine. She remained agitated and with rapid speech. Patient nurse informed this clinician she had been given ativan and haldol earlier. TTS to continue to look for placement  On Exam: Patient was seen today, chart reviewed with treatment team. Patient was laying down in bed awake and alert and oriented to self only. Patient continues to believe that she  won a lottery and was asking for her money. Patient stating that over 600 people have died in her family including her father, son and mother. Patient's stating that she came to the hospital because she passed when she realized that her  father passed away. Patient stated that her father passed away over a year ago. Patient is not able to carry on a logical conversation, she is still floridly psychotic at this time although denying SI/HI.   Past Psychiatric History: As in H&P  Risk to Self: Suicidal Ideation: (UTA pt responding to internal stimuli) Suicidal Intent: (UTA pt responding to internal stimuli) Is patient at risk for suicide?: (UTA pt responding to internal stimuli) Suicidal Plan?: (UTA pt responding to internal stimuli) Access to Means: (UTA pt responding to internal stimuli) What has been your use of drugs/alcohol within the last 12 months?: UTA pt responding to internal stimuli How many times?: (UTA pt responding to internal stimuli) Other Self Harm Risks: (UTA pt responding to internal stimuli) Triggers for Past Attempts: (UTA pt responding to internal stimuli) Intentional Self Injurious Behavior: (UTA pt responding to internal stimuli) Risk to Others: Homicidal Ideation: (UTA pt responding to internal stimuli) Thoughts of Harm to Others: (UTA pt responding to internal stimuli) Current Homicidal Intent: (UTA pt responding to internal stimuli) Current Homicidal Plan: (UTA pt responding to internal stimuli) Access to Homicidal Means: (UTA pt responding to internal stimuli) Identified Victim: (UTA pt responding to internal stimuli) History of harm to others?: (UTA pt responding to internal stimuli) Assessment of Violence: (UTA pt responding to internal stimuli) Violent Behavior Description: UTA pt responding to internal stimuli Does patient have access to weapons?: (UTA pt responding to internal stimuli) Criminal Charges Pending?: (UTA pt responding to internal stimuli) Does patient have a court date: (UTA pt responding to internal stimuli) Court Date: (UTA pt responding to internal stimuli) Prior Inpatient Therapy: Prior Inpatient Therapy: Yes Prior Therapy Dates: 12/2016 Prior Therapy Facilty/Provider(s):  [facility] Reason for Treatment: psychosis Prior Outpatient Therapy: Prior Outpatient Therapy: (UTA pt responding to internal stimuli) Prior Therapy Dates: UTA(UTA pt responding to internal stimuli) Prior Therapy Facilty/Provider(s): UTA(UTA pt responding to internal stimuli) Reason for Treatment: UTA(UTA pt responding to internal stimuli) Does patient have an ACCT team?: Unknown(UTA pt responding to internal stimuli) Does patient have Intensive In-House Services?  : Unknown(UTA pt responding to internal stimuli) Does patient have [facility] services? : Unknown(UTA pt responding to internal stimuli) Does patient have P4CC services?: Unknown(UTA pt responding to internal stimuli)  Past Medical History:  Past Medical History:  Diagnosis Date  . Bipolar 1 disorder ([place])   . Medical history non-contributory   . Mental disorder   . No pertinent past medical history     Past Surgical History:  Procedure Laterality Date  . NO PAST SURGERIES     Family History:  Family History  Problem Relation Age of Onset  . Depression Sister   . Depression Brother   . Depression Sister   . CAD Mother   . Hypertension Father   . Diabetes Father   . Bipolar disorder Cousin    Family Psychiatric  History: Unknown Social History:  Social History   Substance and Sexual Activity  Alcohol Use No     Social History   Substance and Sexual Activity  Drug Use No    Social History   Socioeconomic History  . Marital status: Single    Spouse name: None  . Number of children: None  . Years of education:  None  . Highest education level: None  Social Needs  . Financial resource strain: None  . Food insecurity - worry: None  . Food insecurity - inability: None  . Transportation needs - medical: None  . Transportation needs - non-medical: None  Occupational History  . None  Tobacco Use  . Smoking status: Never Smoker  . Smokeless tobacco: Never Used  Substance and Sexual Activity  . Alcohol use: No   . Drug use: No  . Sexual activity: No  Other Topics Concern  . None  Social History Narrative  . None   Additional Social History:    Allergies:  No Known Allergies  Labs:  Results for orders placed or performed during the hospital encounter of 05/11/17 (from the past 48 hour(s))  Basic metabolic panel     Status: Abnormal   Collection Time: 05/29/17 11:18 AM  Result Value Ref Range   Sodium 136 135 - 145 mmol/L   Potassium 3.8 3.5 - 5.1 mmol/L   Chloride 105 101 - 111 mmol/L   CO2 25 22 - 32 mmol/L   Glucose, Bld 117 (H) 65 - 99 mg/dL   BUN 10 6 - 20 mg/dL   Creatinine, Ser 0.74 0.44 - 1.00 mg/dL   Calcium 9.7 8.9 - 10.3 mg/dL   GFR calc non Af Amer >60 >60 mL/min   GFR calc Af Amer >60 >60 mL/min    Comment: (NOTE) The eGFR has been calculated using the CKD EPI equation. This calculation has not been validated in all clinical situations. eGFR's persistently <60 mL/min signify possible Chronic Kidney Disease.    Anion gap 6 5 - 15  CBC     Status: Abnormal   Collection Time: 05/29/17 11:18 AM  Result Value Ref Range   WBC 4.7 4.0 - 10.5 K/uL   RBC 3.84 (L) 3.87 - 5.11 MIL/uL   Hemoglobin 10.7 (L) 12.0 - 15.0 g/dL   HCT 33.2 (L) 36.0 - 46.0 %   MCV 86.5 78.0 - 100.0 fL   MCH 27.9 26.0 - 34.0 pg   MCHC 32.2 30.0 - 36.0 g/dL   RDW 13.8 11.5 - 15.5 %   Platelets 284 150 - 400 K/uL  Lithium level     Status: None   Collection Time: 05/29/17 11:18 AM  Result Value Ref Range   Lithium Lvl 1.09 0.60 - 1.20 mmol/L    Medications:  Current Facility-Administered Medications  Medication Dose Route Frequency Provider Last Rate Last Dose  . alum & mag hydroxide-simeth (MAALOX/MYLANTA) 200-200-20 MG/5ML suspension 30 mL  30 mL Oral Q6H PRN Street, [place], [place]      . ARIPiprazole (ABILIFY) tablet 15 mg  15 mg Oral Daily Hisada, Reina, MD   15 mg at 05/30/17 1014  . benztropine (COGENTIN) tablet 1 mg  1 mg Oral BID Abryanna Musolino A, NP   1 mg at 05/30/17 0809  .  feeding supplement (ENSURE ENLIVE) (ENSURE ENLIVE) liquid 237 mL  237 mL Oral QID Tanna Furry, MD   237 mL at 05/29/17 1420  . haloperidol (HALDOL) tablet 10 mg  10 mg Oral QHS Rankin, Shuvon B, NP   10 mg at 05/30/17 2226   Or  . haloperidol lactate (HALDOL) injection 10 mg  10 mg Intramuscular QHS Rankin, Shuvon B, NP      . haloperidol (HALDOL) tablet 5 mg  5 mg Oral BID AC Rankin, Shuvon B, NP   5 mg at 05/30/17 1602   Or  .  haloperidol lactate (HALDOL) injection 5 mg  5 mg Intramuscular BID AC Rankin, Shuvon B, NP      . hydrOXYzine (ATARAX/VISTARIL) tablet 25 mg  25 mg Oral Q6H PRN Street, [place], [place]   25 mg at 05/22/17 1633  . lithium carbonate (LITHOBID) CR tablet 300 mg  300 mg Oral BID Hamp Moreland A, NP   300 mg at 05/30/17 2225  . nicotine (NICODERM CQ - dosed in mg/24 hours) patch 21 mg  21 mg Transdermal Daily PRN Street, [place], PA-C      . ondansetron [facility]) tablet 4 mg  4 mg Oral Q8H PRN Street, [place], [place]      . traZODone (DESYREL) tablet 50 mg  50 mg Oral QHS PRN Street, [place], [place]   50 mg at 05/16/17 5465   Current Outpatient Medications  Medication Sig Dispense Refill  . ARIPiprazole (ABILIFY) 10 MG tablet Take 1 tablet (10 mg total) by mouth daily. 30 tablet 0  . carbamazepine (TEGRETOL XR) 200 MG 12 hr tablet Take 1 tablet (200 mg total) by mouth 2 (two) times daily. For mood stabilization 60 tablet 0  . hydrOXYzine (ATARAX/VISTARIL) 25 MG tablet Take 1 tablet (25 mg total) by mouth every 6 (six) hours as needed for anxiety. 60 tablet 0  . OLANZapine zydis (ZYPREXA) 15 MG disintegrating tablet Take 1 tablet (15 mg total) by mouth 2 (two) times daily. 60 tablet 0  . propranolol (INDERAL) 10 MG tablet Take 1 tablet (10 mg total) by mouth 2 (two) times daily. For anxiety 60 tablet 0  . traZODone (DESYREL) 50 MG tablet Take 1 tablet (50 mg total) by mouth at bedtime as needed for sleep. 30 tablet 0    Musculoskeletal: UTA via camera  Psychiatric  Specialty Exam: Physical Exam  Nursing note and vitals reviewed.   Review of Systems  Psychiatric/Behavioral: Positive for hallucinations. Negative for depression, substance abuse and suicidal ideas. The patient is nervous/anxious.     Blood pressure (!) 92/51, pulse 85, temperature 98.6 F (37 C), temperature source Oral, resp. rate 14, height '5\' 3"'  (1.6 m), weight 39.5 kg (87 lb), SpO2 100 %.Body mass index is 15.41 kg/m.  General Appearance: on hospital scrub  Eye Contact:  Good  Speech:  Pressured  Volume:  Normal  Mood:  Euphoric  Affect:  Restricted  Thought Process:  Disorganized and Irrelevant  Orientation:  Other:  self only  Thought Content:  Illogical and Paranoid Ideation  Suicidal Thoughts:  No  Homicidal Thoughts:  No  Memory:  Immediate;   Poor Recent;   Poor Remote;   Poor  Judgement:  Impaired  Insight:  Lacking  Psychomotor Activity:  Normal  Concentration:  Concentration: Fair and Attention Span: Fair  Recall:  Poor  Fund of Knowledge:  Fair  Language:  Fair  Akathisia:  Negative  Handed:  Right  AIMS (if indicated):     Assets:  Desire for Improvement Social Support  ADL's:  Impaired  Cognition:  Impaired,  Moderate  Sleep:        Treatment Plan Summary: Daily contact with patient to assess and evaluate symptoms and progress in treatment, Medication management and Plan for inpatient psychiatric hospitalization  Disposition: Recommend psychiatric Inpatient admission when medically cleared.  This service was provided via telemedicine using a 2-way, interactive audio and video technology.  Names of all persons participating in this telemedicine service and their role in this encounter. Name: Chandra Batch Role: Patient  Name: Siarra Gilkerson A. Lu Duffel  Role: NP           Vicenta Aly, NP 05/31/2017 10:03 AM

## 2017-05-31 NOTE — BH Assessment (Signed)
Contacted [facility] and verified with Selena BattenKim that patient is on the wait list.   Writer also [facility]ed [facility]. Kim verified receipt of tthose forms.

## 2017-05-31 NOTE — Care Management Note (Signed)
Case Management Note  Patient Details  Name: Gypsy BalsamMelvine Curro MRN: [id number] Date of Birth: 09-Aug-1965   If discussed at Long Length of Stay Meetings, dates discussed:   05/31/17:  [facility]   Additional Comments:  Oletta CohnWood, Harm Jou, RN 05/31/2017, 10:52 AM

## 2017-05-31 NOTE — ED Notes (Signed)
Pt ambulated independently to restroom, back in room at this time.  Calm, cooperative

## 2017-05-31 NOTE — ED Notes (Signed)
Pt given peanut butter and jelly sandwich. Pt sitting up in the bed eating.

## 2017-05-31 NOTE — ED Notes (Signed)
New set of IVC papers given to Dr Phineas RealMabe to complete d/t run out today.

## 2017-05-31 NOTE — ED Notes (Signed)
Encouraged pt to ambulate in hallway - declined.

## 2017-05-31 NOTE — ED Notes (Signed)
Pt ambulated in hallway w/o difficulty w/Sitter.

## 2017-05-31 NOTE — ED Notes (Signed)
Telepsych completed.  

## 2017-06-01 ENCOUNTER — Other Ambulatory Visit: Payer: Self-pay

## 2017-06-01 ENCOUNTER — Encounter (HOSPITAL_COMMUNITY): Payer: Self-pay | Admitting: Registered Nurse

## 2017-06-01 ENCOUNTER — Encounter (HOSPITAL_COMMUNITY): Payer: Self-pay | Admitting: *Deleted

## 2017-06-01 ENCOUNTER — Inpatient Hospital Stay (HOSPITAL_COMMUNITY)
Admission: AD | Admit: 2017-06-01 | Discharge: 2017-06-22 | DRG: 885 | Disposition: A | Discharge status: Home / Self Care | Payer: Federal, State, Local not specified - Other | Source: Intra-hospital | Attending: Psychiatry | Admitting: Psychiatry

## 2017-06-01 DIAGNOSIS — F319 Bipolar disorder, unspecified: Secondary | ICD-10-CM | POA: Diagnosis present

## 2017-06-01 DIAGNOSIS — Z833 Family history of diabetes mellitus: Secondary | ICD-10-CM

## 2017-06-01 DIAGNOSIS — F314 Bipolar disorder, current episode depressed, severe, without psychotic features: Secondary | ICD-10-CM | POA: Diagnosis present

## 2017-06-01 DIAGNOSIS — Z818 Family history of other mental and behavioral disorders: Secondary | ICD-10-CM

## 2017-06-01 DIAGNOSIS — Z634 Disappearance and death of family member: Secondary | ICD-10-CM | POA: Diagnosis not present

## 2017-06-01 DIAGNOSIS — Z560 Unemployment, unspecified: Secondary | ICD-10-CM | POA: Diagnosis not present

## 2017-06-01 DIAGNOSIS — F29 Unspecified psychosis not due to a substance or known physiological condition: Secondary | ICD-10-CM | POA: Diagnosis not present

## 2017-06-01 DIAGNOSIS — Z9114 Patient's other noncompliance with medication regimen: Secondary | ICD-10-CM | POA: Diagnosis not present

## 2017-06-01 DIAGNOSIS — F312 Bipolar disorder, current episode manic severe with psychotic features: Principal | ICD-10-CM | POA: Diagnosis present

## 2017-06-01 DIAGNOSIS — Z8249 Family history of ischemic heart disease and other diseases of the circulatory system: Secondary | ICD-10-CM

## 2017-06-01 DIAGNOSIS — Z590 Homelessness: Secondary | ICD-10-CM | POA: Diagnosis not present

## 2017-06-01 MED ORDER — HALOPERIDOL 5 MG PO TABS
5.0000 mg | ORAL_TABLET | Freq: Two times a day (BID) | ORAL | Status: DC
  Filled 2017-06-01 (×5): qty 1

## 2017-06-01 MED ORDER — LAMOTRIGINE 100 MG PO TABS
300.0000 mg | ORAL_TABLET | Freq: Two times a day (BID) | ORAL | Status: DC
  Filled 2017-06-01: qty 2
  Filled 2017-06-01: qty 3
  Filled 2017-06-01: qty 2
  Filled 2017-06-01 (×2): qty 3

## 2017-06-01 MED ORDER — MAGNESIUM HYDROXIDE 400 MG/5ML PO SUSP: 30.0000 mL | Freq: Every day | ORAL | Status: DC | PRN

## 2017-06-01 MED ORDER — TRAZODONE HCL 50 MG PO TABS: 50.0000 mg | ORAL_TABLET | Freq: Every evening | ORAL | Status: DC | PRN

## 2017-06-01 MED ORDER — HYDROXYZINE HCL 25 MG PO TABS: 25.0000 mg | ORAL_TABLET | Freq: Four times a day (QID) | ORAL | Status: DC | PRN

## 2017-06-01 MED ORDER — ENSURE ENLIVE PO LIQD: 237.0000 mL | Freq: Two times a day (BID) | ORAL | Status: DC

## 2017-06-01 MED ORDER — ALUM & MAG HYDROXIDE-SIMETH 200-200-20 MG/5ML PO SUSP: 30.0000 mL | ORAL | Status: DC | PRN

## 2017-06-01 MED ORDER — ACETAMINOPHEN 325 MG PO TABS: 650.0000 mg | ORAL_TABLET | Freq: Four times a day (QID) | ORAL | Status: DC | PRN

## 2017-06-01 MED ORDER — ENSURE ENLIVE PO LIQD: 237.0000 mL | Freq: Three times a day (TID) | ORAL | Status: DC

## 2017-06-01 MED ORDER — BENZTROPINE MESYLATE 1 MG PO TABS
1.0000 mg | ORAL_TABLET | Freq: Two times a day (BID) | ORAL | Status: DC
  Filled 2017-06-01 (×5): qty 1

## 2017-06-01 NOTE — Progress Notes (Addendum)
9:32: CSW spoke with pt at bedside. Pt informed CSW that pt has nobody to stay with and that pt does not want to go to a shelter but would like to be given house to stay in. CSW informed pt that unfortunately the hospital is unable to provide that resources to pt at this time. CSW explained the available resources to pt and pt seemed to be understanding but still unsure about a shelter.   CSW spoke with pt's sister and was informed that pt's sister returned Medicaid Application to DSS on Friday. Pt's sister reports that she received a letter informing her that they did receive the application for pt and that they are reviewing it. CSW was informed that pt can not stay with pt's sister or any other family members as pt has been very aggressive and threatening towards her and other family members. CSW updated Yvonne [name] of this and explained to pt's sister and Jolan that at this time considering that pt does not have any financial assistance placement for pt would be hard or impossible.    At this time, if pt is psych cleared and medically cleared for discharge, [facility] for those needing shelter.      Yvonne [name], MSW, LCSW-A Emergency Department Clinical Social Worker [phone]

## 2017-06-01 NOTE — ED Notes (Signed)
Report called to [facility] 

## 2017-06-01 NOTE — ED Notes (Signed)
Pt doing tele-psych.  

## 2017-06-01 NOTE — Tx Team (Signed)
Initial Treatment Plan 06/01/2017 4:24 PM Yvonne BalsamMelvine Hoobler ZOX:[id number]    PATIENT STRESSORS: Financial difficulties Medication change or noncompliance Other: "I'm homeless"   PATIENT STRENGTHS: Average or above average intelligence Communication skills General fund of knowledge   PATIENT IDENTIFIED PROBLEMS: Psychosis "I'm upset, there's nothing wrong with me, I don't need to be here" "Just love, I've lost everything"                     DISCHARGE CRITERIA:  Ability to meet basic life and health needs Improved stabilization in mood, thinking, and/or behavior Verbal commitment to aftercare and medication compliance  PRELIMINARY DISCHARGE PLAN: Attend aftercare/continuing care group Placement in alternative living arrangements  PATIENT/FAMILY INVOLVEMENT: This treatment plan has been presented to and reviewed with the patient, Yvonne [name], and/or family member, .  The patient and family have been given the opportunity to ask questions and make suggestions.  Tammera Engert, [place] Wayne, [place]RN 06/01/2017, 4:24 PM

## 2017-06-01 NOTE — ED Notes (Signed)
Regular diet lunch tray ordered @ 1206. 

## 2017-06-01 NOTE — ED Notes (Signed)
Pt verbalized understanding discharge instructions and denies any further needs or questions at this time. VS stable, ambulatory and steady gait.   [facility]

## 2017-06-01 NOTE — Progress Notes (Signed)
Johnna AcostaMelvine is a 51 year old female pt admitted on involuntary basis. On admission, she immediately responds by stating that she is upset and does not need to be here and reports there is nothing wrong with her. This was the theme for much of the admission process, however she was able to explain that she did not take her medications when she left here. She also spoke about how she is homeless and needs us to help her find somewhere to go and then turned around and spoke about how she is waiting on some lottery money and about how she has a lot of money and then went into how she is suppose to be getting a check for SSI and does not know where this money is. Michaline appears emaciated on admission and reports that ever since her father died she has been unable to eat much. She denies any SI and is able to contract for safety while in the hospital. Sumaya was oriented to the hospital and safety maintained.

## 2017-06-01 NOTE — Progress Notes (Signed)
CSW [facility] for an update regarding pt. CSW acknowledges [facility], however is still unsure as to whether pt is cleared or still needing to be seen by NP at this time.CSW spoke with Jolan for clarity and at this time is awaiting call back from [place] on pt's psych status.     Claude MangesKierra S. Tanor Glaspy, MSW, LCSW-A Emergency Department Clinical Social Worker [phone]

## 2017-06-01 NOTE — Progress Notes (Signed)
Per Yvonne [name] , [facility] ; Accepting provider is Yvonne FoundShuvon Rankin, NP; Attending provider is Dr.Cobos.  Patient can arrive now, the bed is ready. Number for report is [phone].   Yvonne Hamperasey Hamann, RN notified.    [name] MSW, LCSWA CSW Disposition [phone]

## 2017-06-01 NOTE — ED Notes (Signed)
Regular Diet has been ordered for Lunch. 

## 2017-06-01 NOTE — ED Notes (Signed)
[facility]  Time and bed are pending. Pt is IVC'd until 06-07-17 so Pt will need to go [facility].

## 2017-06-01 NOTE — ED Notes (Signed)
Patient was given a peanut and jelly Sandwich w/ Orange Juice for [employer].

## 2017-06-01 NOTE — Progress Notes (Addendum)
CSW spoke with [facility] disposition CSW Jolan about pt. CSW was informed that pt was reassessed on yesterday and was said to still meet inpt criteria for placement. As of this morning pt still has not been reassess, however CSW did speak with Jolan and made request that pt be reevaluated ASAP today per Dr. Loni Muse (Medical Director)  so that [place] can work towards ensuring that pt's needs are met. Jolan reports that she will bring pt's case up in bed meetings this morning and update CSW ASAP. At this time CSW continues to assist with pt's needs.      Virgie Dad Iesha Summerhill, MSW, [place] Emergency Department Clinical Social Worker [phone]

## 2017-06-01 NOTE — Progress Notes (Addendum)
2:29 PM - CSW received phone call from Dr. Loni Muse. Dr. A informed CSW that [facility] has a bed available for the pt and the pt will be going to [facility] today. CSW called Radonna Ricker and confirmed that [facility] will be accepting the pt. Jolan stated that they are unsure when the pt's bed will be available. CSW was informed to inform RN to call for time and transport to [facility]. Pt is IVC. Per RN pt will be transported by [facility] to [facility]. At this time, CSW has updated RN and doctor of plan of care. CSW will handoff to ED evening CSW for further guidance in getting pt to [facility]. At this time, this CSW is signing off.   2:01pm- CSW received call from Dr. Loni Muse informing CSW that he had spoken with Fulton Reek Chief Nurse Executive at [facility] and was informed that pt has three options. Per Dr. Loni Muse these options include 1- pt is either cleared and is discharged, 2 and 3- pt is not cleared then pt must go to [facility] or [facility] tonight. CSW spoke with Dr. Loni Muse and informed him that CSW would continues to assist in seeing that pt get needs met and would handoff to [place] as needed.     CSW received call from  Dr A Clinical cytogeneticist) and was asked for an update regarding pt. CSW informed Dr. Loni Muse that at this time pt has been reassessed but CSW was not sure if pt had been cleared as note from [facility] Counselor did not specify. CSW was asked by Dr. Loni Muse for pt's name so that he can find out more I information. CSW was informed by Dr. Loni Muse that if pt is cleared by psych then we have no reason to continue to hold pt. At this time CSW  Remains available for discharge needs.     Virgie Dad Alexsia Klindt, MSW, [place] Emergency Department Clinical Social Worker [phone]

## 2017-06-01 NOTE — Progress Notes (Signed)
Yvonne [name] was awake and visible in milieu, attended and participated in evening group activity. She had minimal interaction with peers or staff and went to her room shortly after 9pm this evening. She refused medications this evening and became very defensive and agitated when asked to take them. She refused haldol and said she hadn't been taking them and rebuffed any attempts to educate pt in regards to her medicatons. A. Medication education attempted R. Yvonne [name] refused medications, will continue to monitor.

## 2017-06-01 NOTE — Progress Notes (Signed)
Group Note:   The patient shared in group that she did not have a very good day. The patient expressed that she does not agree with being admitted to the hospital and is very concerned about being homeless. Patient also states that she recently lost her job and is concerned about not receiving any pension or medical benefits. Her goal for tomorrow is to speak with the case manager about housing and medical benefits.

## 2017-06-01 NOTE — ED Notes (Signed)
[facility] called for transport 

## 2017-06-01 NOTE — ED Notes (Signed)
Pt requesting RN to call her sister, Jerrye BeaversHazel. Attempt to call, no answer.

## 2017-06-01 NOTE — BHH Counselor (Signed)
Re-assessed:   Patient concerned about being discharged. Patient stated someone told her she would be discharged today. Patient expressed if she's discharged she would be homeless because she has no where to go. Patient also complained of having no shoes and no money. Patient report she's sleeping good.  Patient denies SI, HI, and AVH.

## 2017-06-01 NOTE — ED Notes (Signed)
Received report from Ryland, RN 

## 2017-06-02 DIAGNOSIS — Z818 Family history of other mental and behavioral disorders: Secondary | ICD-10-CM

## 2017-06-02 DIAGNOSIS — F312 Bipolar disorder, current episode manic severe with psychotic features: Principal | ICD-10-CM

## 2017-06-02 MED ORDER — CLONAZEPAM 0.5 MG PO TABS: 0.5000 mg | ORAL_TABLET | Freq: Two times a day (BID) | ORAL | Status: DC

## 2017-06-02 MED ORDER — CLONAZEPAM 0.5 MG PO TABS
0.5000 mg | ORAL_TABLET | Freq: Every day | ORAL | Status: DC
  Filled 2017-06-02: qty 1

## 2017-06-02 MED ORDER — OLANZAPINE 10 MG PO TABS
10.0000 mg | ORAL_TABLET | Freq: Every day | ORAL | Status: DC
  Administered 2017-06-04: 10 mg via ORAL
  Filled 2017-06-02 (×5): qty 1

## 2017-06-02 NOTE — H&P (Signed)
Psychiatric Admission Assessment Adult  Patient Identification: Yvonne [name] MRN:  [id number] Date of Evaluation:  06/02/2017 Chief Complaint:  bipolar 1 disorder most recent episode manic with psychotic features Principal Diagnosis: Bipolar I disorder, current or most recent episode manic, with psychotic features (HCC) Diagnosis:   Patient Active Problem List   Diagnosis Date Noted  . Bipolar disorder, curr episode depressed, severe, w/psychotic features (HCC) [F31.5] 06/01/2017  . Bipolar I disorder, current or most recent episode manic, with psychotic features (HCC) [F31.2]   . Bipolar affective disorder, current episode mild (HCC) [F31.9] 01/05/2017  . Manic behavior (HCC) [F30.10]   . Facial cellulitis [L03.211] 02/10/2014   History of Present Illness: Yvonne [name] is presently admitted for acute psychosis in the context of suspected mania.  UDS has been negative.  She has a history of bipolar disorder with intermittent medication adherence, history of aggressive behavior, and continues to struggle with agitation and disorganized thinking.  Patient reports that her primary issue is that she was fired from working at the [employer].  She reports that she has worked there for 47 years (since she was 51 years old).  She reports that she is healthy and [place] and does not need any medicine.  Distrusting of providers distrusting of healthcare system.  Inquired about where she would go when she leaves the hospital, and she is not sure.  She is willing to speak with social work to consider group home.  Spent time exploring the patient's mood, sleep, appetite, anxiety.  She does endorse dysphoria, anxiety about her future, difficulty with appetite.  She refuses medications and continues to insist that she does not need medicine.   She is tangential at times, and presents irrelevant information.  Past Psychiatric History: Patient has a lengthy psychiatric history of bipolar  disorder multiple psychiatric hospitalizations with aggression and combative behaviors.  Has been hospitalized at behavioral health most recently in July 2018.  Struggles with poor medication adherence.  Patient has been seen by this writer as well in the past, has generally refused to take medications except for Tegretol.  Is the patient at risk to self? Yes.    Has the patient been a risk to self in the past 6 months? Yes.    Has the patient been a risk to self within the distant past? Yes.    Is the patient a risk to others? Yes.    Has the patient been a risk to others in the past 6 months? Yes.    Has the patient been a risk to others within the distant past? Yes.     Prior Inpatient Therapy:   Prior Outpatient Therapy:    Alcohol Screening: 1. How often do you have a drink containing alcohol?: Never 2. How many drinks containing alcohol do you have on a typical day when you are drinking?: 1 or 2 3. How often do you have six or more drinks on one occasion?: Never AUDIT-C Score: 0 4. How often during the last year have you found that you were not able to stop drinking once you had started?: Never 5. How often during the last year have you failed to do what was normally expected from you becasue of drinking?: Never 6. How often during the last year have you needed a first drink in the morning to get yourself going after a heavy drinking session?: Never 7. How often during the last year have you had a feeling of guilt of remorse after drinking?:  Never 8. How often during the last year have you been unable to remember what happened the night before because you had been drinking?: Never 9. Have you or someone else been injured as a result of your drinking?: No 10. Has a relative or friend or a doctor or another health worker been concerned about your drinking or suggested you cut down?: No Alcohol Use Disorder Identification Test Final Score (AUDIT): 0 Intervention/Follow-up: AUDIT Score <7  follow-up not indicated Substance Abuse History in the last 12 months:  No. Consequences of Substance Abuse: Negative Previous Psychotropic Medications: Yes  Psychological Evaluations: No  Past Medical History:  Past Medical History:  Diagnosis Date  . Bipolar 1 disorder (HCC)   . Medical history non-contributory   . Mental disorder   . No pertinent past medical history     Past Surgical History:  Procedure Laterality Date  . NO PAST SURGERIES     Family History:  Family History  Problem Relation Age of Onset  . Depression Sister   . Depression Brother   . Depression Sister   . CAD Mother   . Hypertension Father   . Diabetes Father   . Bipolar disorder Cousin    Family Psychiatric  History: n/a Tobacco Screening: Have you used any form of tobacco in the last 30 days? (Cigarettes, Smokeless Tobacco, Cigars, and/or Pipes): No Social History:  Social History   Substance and Sexual Activity  Alcohol Use No     Social History   Substance and Sexual Activity  Drug Use No    Additional Social History:                           Allergies:  No Known Allergies Lab Results: No results found for this or any previous visit (from the past 48 hour(s)).  Blood Alcohol level:  Lab Results  Component Value Date   ETH <10 05/11/2017   ETH <10 05/07/2017    Metabolic Disorder Labs:  Lab Results  Component Value Date   HGBA1C 5.5 06/21/2012   MPG 111 06/21/2012   No results found for: PROLACTIN Lab Results  Component Value Date   CHOL 166 06/21/2012   TRIG 58 06/21/2012   HDL 58 06/21/2012   CHOLHDL 2.9 06/21/2012   VLDL 12 06/21/2012   LDLCALC 96 06/21/2012    Current Medications: Current Facility-Administered Medications  Medication Dose Route Frequency Provider Last Rate Last Dose  . acetaminophen (TYLENOL) tablet 650 mg  650 mg Oral Q6H PRN Rankin, Shuvon B, NP      . alum & mag hydroxide-simeth (MAALOX/MYLANTA) 200-200-20 MG/5ML suspension 30 mL   30 mL Oral Q4H PRN Rankin, Shuvon B, NP      . clonazePAM (KLONOPIN) tablet 0.5 mg  0.5 mg Oral Daily Eksir, Bo McclintockAlexander Arya, MD      . feeding supplement (ENSURE ENLIVE) (ENSURE ENLIVE) liquid 237 mL  237 mL Oral TID BM Rankin, Shuvon B, NP      . magnesium hydroxide (MILK OF MAGNESIA) suspension 30 mL  30 mL Oral Daily PRN Rankin, Shuvon B, NP      . OLANZapine (ZYPREXA) tablet 10 mg  10 mg Oral QHS Eksir, Bo McclintockAlexander Arya, MD       PTA Medications: Medications Prior to Admission  Medication Sig Dispense Refill Last Dose  . ARIPiprazole (ABILIFY) 10 MG tablet Take 1 tablet (10 mg total) by mouth daily. 30 tablet 0 See note at ??  .  carbamazepine (TEGRETOL XR) 200 MG 12 hr tablet Take 1 tablet (200 mg total) by mouth 2 (two) times daily. For mood stabilization 60 tablet 0 See note at ??  . hydrOXYzine (ATARAX/VISTARIL) 25 MG tablet Take 1 tablet (25 mg total) by mouth every 6 (six) hours as needed for anxiety. 60 tablet 0 See note at ??  . propranolol (INDERAL) 10 MG tablet Take 1 tablet (10 mg total) by mouth 2 (two) times daily. For anxiety 60 tablet 0 See note at ??  . traZODone (DESYREL) 50 MG tablet Take 1 tablet (50 mg total) by mouth at bedtime as needed for sleep. 30 tablet 0 See note at ??    Musculoskeletal: Strength & Muscle Tone: within normal limits Gait & Station: normal Patient leans: N/A  Psychiatric Specialty Exam: Physical Exam  Review of Systems  Reason unable to perform ROS: Patient is unable to participate in review of systems due to psychosis.    Blood pressure (!) 78/46, pulse (!) 115, temperature 98.8 F (37.1 C), temperature source Oral, resp. rate 14, height 5\' 2"  (1.575 m), weight 39.5 kg (87 lb).Body mass index is 15.91 kg/m.  General Appearance: Bizarre  Eye Contact:  Minimal  Speech:  Slow  Volume:  Decreased  Mood:  Dysphoric  Affect:  Constricted and Flat  Thought Process:  Disorganized, Irrelevant and Descriptions of Associations: Tangential   Orientation:  Full (Time, Place, and Person)  Thought Content:  Delusions and Tangential  Suicidal Thoughts:  No  Homicidal Thoughts:  No  Memory:  Immediate;   Fair  Judgement:  Impaired  Insight:  Lacking  Psychomotor Activity:  Decreased  Concentration:  Concentration: Poor  Recall:  Poor  Fund of Knowledge:  Poor  Language:  Fair  Akathisia:  Negative  Handed:  Right  AIMS (if indicated):     Assets:  Communication Skills  ADL's:  Intact  Cognition:  WNL  Sleep:       Treatment Plan Summary: Yvonne [name] is a 51 year old female with multiple psychiatric hospitalizations for bipolar disorder, who has continued to struggle with poor medication adherence.  She presents for admission today once again in the context of medication nonadherence.  She continues to present with delusional thinking, tangentiality, and paranoia.  She is refusing medications, so I will discontinue Lamictal given the increased risk of Stevens-Johnson with intermittent administration.  Will initiate clonazepam daily and Zyprexa nightly.  If the patient continues to refuse medications, we will likely need to initiate nonemergent forced medications, and she can be offered either Zyprexa oral or injectable nightly.  Observation Level/Precautions:  15 minute checks  Laboratory:  completed in ER  Psychotherapy:  Individual and miliue therapies  Medications:  Clonazepam 0.5 mg daily and Zyprexa 10 mg nightly Initiate Nonemergent forced medications if continued refusal   Consultations:  psychiatry  Discharge Concerns:  Group home placement, and obtain POA if not already started. Patient should likely have ACT if not already in place  Estimated LOS: 10-15 days  Other:     Physician Treatment Plan for Primary Diagnosis: Bipolar I disorder, current or most recent episode manic, with psychotic features (HCC) Long Term Goal(s): Improvement in symptoms so as ready for discharge  Short Term Goals: Ability to identify  changes in lifestyle to reduce recurrence of condition will improve, Ability to verbalize feelings will improve, Ability to disclose and discuss suicidal ideas, Ability to demonstrate self-control will improve, Ability to identify and develop effective coping behaviors will improve, Ability to  maintain clinical measurements within normal limits will improve, Compliance with prescribed medications will improve and Ability to identify triggers associated with substance abuse/mental health issues will improve  Physician Treatment Plan for Secondary Diagnosis: Principal Problem:   Bipolar I disorder, current or most recent episode manic, with psychotic features (HCC) Active Problems:   Bipolar disorder, curr episode depressed, severe, w/psychotic features (HCC)  Long Term Goal(s): Improvement in symptoms so as ready for discharge  Short Term Goals: Ability to identify changes in lifestyle to reduce recurrence of condition will improve, Ability to verbalize feelings will improve, Ability to disclose and discuss suicidal ideas, Ability to demonstrate self-control will improve, Ability to identify and develop effective coping behaviors will improve, Ability to maintain clinical measurements within normal limits will improve, Compliance with prescribed medications will improve and Ability to identify triggers associated with substance abuse/mental health issues will improve  I certify that inpatient services furnished can reasonably be expected to improve the patient's condition.    Burnard Leigh, MD 11/22/201810:33 AM

## 2017-06-02 NOTE — Progress Notes (Signed)
Did not attend group 

## 2017-06-02 NOTE — [facility] Suicide Risk Assessment (Signed)
[facility]   Nursing information obtained from:    Demographic factors:    Current Mental Status:    Loss Factors:    Historical Factors:    Risk Reduction Factors:     Total Time spent with patient: 30 minutes Principal Problem: Bipolar I disorder, current or most recent episode manic, with psychotic features [facility](HCC) Diagnosis:   Patient Active Problem List   Diagnosis Date Noted  . Bipolar disorder, curr episode depressed, severe, w/psychotic features (HCC) [F31.5] 06/01/2017  . Bipolar I disorder, current or most recent episode manic, with psychotic features (HCC) [F31.2]   . Bipolar affective disorder, current episode mild (HCC) [F31.9] 01/05/2017  . Manic behavior (HCC) [F30.10]   . Facial cellulitis [L03.211] 02/10/2014   Subjective Data: See intake H&P for full details. Reviewed, with no updates at this time.   Continued Clinical Symptoms:  Alcohol Use Disorder Identification Test Final Score (AUDIT): 0 The "Alcohol Use Disorders Identification Test", Guidelines for Use in Primary Care, Second Edition.  World Science writerHealth Organization [facility](WHO). Score between 0-7:  no or low risk or alcohol related problems. Score between 8-15:  moderate risk of alcohol related problems. Score between 16-19:  high risk of alcohol related problems. Score 20 or above:  warrants further diagnostic evaluation for alcohol dependence and treatment.   CLINICAL FACTORS:   Schizophrenia:   Paranoid or undifferentiated type   Musculoskeletal: Strength & Muscle Tone: within normal limits Gait & Station: normal Patient leans: N/A  Psychiatric Specialty Exam: Physical Exam  Review of Systems  Unable to perform ROS: Psychiatric disorder (disorganized, psychotic)    Blood pressure (!) 78/46, pulse (!) 115, temperature 98.8 F (37.1 C), temperature source Oral, resp. rate 14, height 5\' 2"  (1.575 m), weight 39.5 kg (87 lb).Body mass index is 15.91 kg/m.  General Appearance:  Bizarre and Disheveled  Eye Contact:  Minimal  Speech:  Slow  Volume:  Decreased  Mood:  Dysphoric  Affect:  Flat  Thought Process:  Disorganized, Irrelevant and Descriptions of Associations: Tangential  Orientation:  NA  Thought Content:  Delusions and Paranoid Ideation  Suicidal Thoughts:  No  Homicidal Thoughts:  No  Memory:  Immediate;   Poor  Judgement:  Impaired  Insight:  Lacking  Psychomotor Activity:  Normal  Concentration:  Concentration: Poor  Recall:  NA  Fund of Knowledge:  Poor  Language:  Fair  Akathisia:  Negative  Handed:  Right  AIMS (if indicated):     Assets:  Communication Skills  ADL's:  Intact  Cognition:  WNL  Sleep:         COGNITIVE FEATURES THAT CONTRIBUTE TO RISK:  Loss of executive function    SUICIDE RISK:   Moderate:  Frequent suicidal ideation with limited intensity, and duration, some specificity in terms of plans, no associated intent, good self-control, limited dysphoria/symptomatology, some risk factors present, and identifiable protective factors, including available and accessible social support.  PLAN OF CARE: See intake H&P for full details. Reviewed, with no updates at this time.   I certify that inpatient services furnished can reasonably be expected to improve the patient's condition.   Burnard LeighAlexander Arya Koden Hunzeker, MD 06/02/2017, 10:22 AM

## 2017-06-02 NOTE — Progress Notes (Signed)
D: Pt denies SI/HI/AVH. Pt is pleasant and cooperative most of the evening. Pt stated "I'm not taking no medicine" pt stated she was homeless and did not need any medications, pt said her father had a house but the pt did not have a key , but that would be a place for her to stay. Pt appears paranoid and resistant to care and Tx.   A: Pt was offered support and encouragement. Pt was encourage to attend groups. Q 15 minute checks were done for safety.   R: safety maintained on unit.

## 2017-06-02 NOTE — Plan of Care (Signed)
Pt safe on the unit at this time, pt has been sleeping at night per check sheet

## 2017-06-02 NOTE — Progress Notes (Signed)
Report received. Patient observed in room asleep in bed. Respirations are even and non labored. No distress noted. Monitoring of patient continues. Patient remains safe on unit.

## 2017-06-02 NOTE — Progress Notes (Signed)
Patient ID: Gypsy BalsamMelvine [name], female   DOB: Jan 13, 1966, 51 y.o.   MRN: [id number]  DAR: Pt. Denies SI/HI and A/V Hallucinations. She appears anxious and tearful this morning. She reports, "my daddy died and I lost my job at [employer]." "I need a job." She reports that she needs Support and encouragement provided to the patient however patient is minimal with Clinical research associatewriter at this time. Patient refused all scheduled medication and Ensure at this time. Writer encouraged patient to try Ensure however patient again refused. Patient was encouraged to eat and drink today as tolerated. Patient is calm when speaking generally however when writer asked patient about her medications she became irritable and tangential. Q15 minute checks are maintained for safety.

## 2017-06-03 DIAGNOSIS — Z590 Homelessness: Secondary | ICD-10-CM

## 2017-06-03 DIAGNOSIS — Z560 Unemployment, unspecified: Secondary | ICD-10-CM

## 2017-06-03 NOTE — Progress Notes (Signed)
D: Pt stayed in room duration of the evening A: Pt was offered support and encouragement. Pt was given scheduled medications. Pt was encourage to attend groups. Q 15 minute checks were done for safety.   R: safety maintained on unit.

## 2017-06-03 NOTE — BHH Counselor (Signed)
Adult Comprehensive Assessment  Patient ID: Yvonne [name], female   DOB: 06-28-1966, 51 y.o.   MRN: [id number]  Information Source: Information source: Patient  Current Stressors:  Employment / Job issues: Unemployed- No income Family Relationships: Limited Youth workersupport Financial / Lack of resources (include bankruptcy): No income/clothing Housing / Lack of housing: Homeless  Living/Environment/Situation:  Living Arrangements: Other (Comment)(homeless) Living conditions (as described by patient or guardian): Homeless How long has patient lived in current situation?: 1 year What is atmosphere in current home: Dangerous  Family History:  Marital status: Single Are you sexually active?: No What is your sexual orientation?: Heterosexual Has your sexual activity been affected by drugs, alcohol, medication, or emotional stress?: N/A Does patient have children?: Yes(Son passeed away 2015) How many children?: 1 How is patient's relationship with their children?: Son passed away in 2015  Childhood History:  By whom was/is the patient raised?: Both parents Description of patient's relationship with caregiver when they were a child: Wonderful realtionship, spent time with both parents. Patient's description of current relationship with people who raised him/her: Parents deseased How were you disciplined when you got in trouble as a child/adolescent?: Whoppings Does patient have siblings?: Yes Number of Siblings: 7 Description of patient's current relationship with siblings: Good relationship with them.  Did patient suffer any verbal/emotional/physical/sexual abuse as a child?: No Did patient suffer from severe childhood neglect?: No Has patient ever been sexually abused/assaulted/raped as an adolescent or adult?: No Was the patient ever a victim of a crime or a disaster?: No Witnessed domestic violence?: No Has patient been effected by domestic violence as an adult?: No  Education:  Highest  grade of school patient has completed: 12th grade, some college, Hotel managermilitary Currently a Consulting civil engineerstudent?: No Learning disability?: No  Employment/Work Situation:   Employment situation: Unemployed Patient's job has been impacted by current illness: Yes Describe how patient's job has been impacted: Stayed out too long becuase of fathers death, client was let go. 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) Has patient ever served in combat?: No Did You Receive Any Psychiatric Treatment/Services While in Equities traderthe Military?: No Are There Guns or Other Weapons in Your Home?: No  Financial Resources:   Financial resources: No income Does patient have a Lawyerrepresentative payee or guardian?: No  Alcohol/Substance Abuse:   What has been your use of drugs/alcohol within the last 12 months?: None reported Alcohol/Substance Abuse Treatment Hx: Denies past history Has alcohol/substance abuse ever caused legal problems?: (N/A)  Social Support System:   Patient's Community Support System: None Type of faith/religion: Chrisitan How does patient's faith help to cope with current illness?: "I know God aint bring me this far to leave me" Gives hope.  Leisure/Recreation:   Leisure and Hobbies: Volleyball  Strengths/Needs:   What things does the patient do well?: Working In what areas does patient struggle / problems for patient: Employment and housing  Discharge Plan:   Does patient have access to transportation?: Yes Will patient be returning to same living situation after discharge?: Yes Currently receiving community mental health services: No If no, would patient like referral for services when discharged?: Yes (What county?)([place]) Does patient have financial barriers related to discharge medications?: Yes Patient description of barriers related to discharge  medications: No income  Summary/Recommendations:   Summary and Recommendations (to be completed by the  evaluator): Yvonne [name] is a 51 year old African American female diagnosed with Bipolar I disorder, current or most recent episode manic, with psychotic features. She was admitted involuntarily for agitation and psychosis. Client reports that she is homeless and had nowhere else to go "I'll die if I stay out there". She presents alert, depressed, teary and anxious. Her current plan is to find somewhere to stay, find a job and apply for SSI disability. While here, Lileigh can benefit from crisis stabilization, medication management, therapeutic milieu and referral for services.    Johny Shearsassandra  Silus Lanzo. 06/03/2017

## 2017-06-03 NOTE — Plan of Care (Signed)
Pt safe on the unit 

## 2017-06-03 NOTE — Progress Notes (Signed)
DAR NOTE: Patient presents with anxious affect and depressed mood. Pt has been in the dayroom watching TV with peers. Pt did not ate break fast, but was able to eat lunch and dinner. Pt has been drinking fluids, but refused the ensure and medications. Denies pain, auditory and visual hallucinations.  Rates depression at 4, hopelessness at 8, and anxiety at 6.  Maintained on routine safety checks.  Support and encouragement offered as needed.  Attended group and participated.  Patient observed socializing with peers in the dayroom.  Offered no complaint.

## 2017-06-03 NOTE — BHH Group Notes (Signed)
06/03/2017 11AM Type of Therapy/Topic:  Group Therapy:  Feelings about Diagnosis  Participation Level:  None   Description of Group:   This group will allow patients to explore their thoughts and feelings about diagnoses they have received. Patients will be guided to explore their level of understanding and acceptance of these diagnoses. Facilitator will encourage patients to process their thoughts and feelings about the reactions of others to their diagnosis and will guide patients in identifying ways to discuss their diagnosis with significant others in their lives. This group will be process-oriented, with patients participating in exploration of their own experiences, giving and receiving support, and processing challenge from other group members.   Therapeutic Goals: 1. Patient will demonstrate understanding of diagnosis as evidenced by identifying two or more symptoms of the disorder 2. Patient will be able to express two feelings regarding the diagnosis 3. Patient will demonstrate their ability to communicate their needs through discussion and/or role play  Summary of Patient Progress:  Tamiyah attended group, but did not participate in discussion.   Therapeutic Modalities:   Cognitive Behavioral Therapy Brief Therapy Feelings Identification    Johny ShearsCassandra  Ryman Rathgeber, Alexander MtLCSW 06/03/2017 12:25 PM

## 2017-06-03 NOTE — Progress Notes (Addendum)
[facility]  06/03/2017 8:36 AM Yvonne BalsamMelvine [name]  MRN:  [id number] Subjective:   Patient seen, chart reviewed and case discussed with nursing staff. She refused to eat meals and take medication.   Patient states that she is here as she is homeless. She states that she needs to find a job. She states that she was fired from work as she lost her father last year. She wants to "get back on feet" and is trying to find a key of her father's house as she is homeless. [facility]. She states she was told bipolar once when she lost her son in [id number]; she states that she does not have any mental issues and adamantly refuses to take medication. When she is asked the reason to stay in the bed, she talks about case worker and she hopes that the staff could help for her job issues. She ate breakfast this morning. She agrees to eat each meal and come out from her room. She denies feeling depressed. She reports appetite loss, stating that the food does not taste good. She denies SI, HI, AH/VH. She denies insomnia. She denies paranoia.   Principal Problem: Bipolar I disorder, current or most recent episode manic, with psychotic features (HCC) Diagnosis:   Patient Active Problem List   Diagnosis Date Noted  . Bipolar disorder, curr episode depressed, severe, w/psychotic features (HCC) [F31.5] 06/01/2017  . Bipolar I disorder, current or most recent episode manic, with psychotic features (HCC) [F31.2]   . Bipolar affective disorder, current episode mild (HCC) [F31.9] 01/05/2017  . Manic behavior (HCC) [F30.10]   . Facial cellulitis [L03.211] 02/10/2014   Total Time spent with patient: 20 minutes  Past Psychiatric History: See initial H&P for dull details. Reviewed, with no updates at this time.   Past Medical History:  Past Medical History:  Diagnosis Date  . Bipolar 1 disorder (HCC)   . Medical history non-contributory   . Mental disorder   . No  pertinent past medical history     Past Surgical History:  Procedure Laterality Date  . NO PAST SURGERIES     Family History:  Family History  Problem Relation Age of Onset  . Depression Sister   . Depression Brother   . Depression Sister   . CAD Mother   . Hypertension Father   . Diabetes Father   . Bipolar disorder Cousin    Family Psychiatric  History: See initial H&P for dull details. Reviewed, with no updates at this time.  Social History:  Social History   Substance and Sexual Activity  Alcohol Use No     Social History   Substance and Sexual Activity  Drug Use No    Social History   Socioeconomic History  . Marital status: Single    Spouse name: None  . Number of children: None  . Years of education: None  . Highest education level: None  Social Needs  . Financial resource strain: None  . Food insecurity - worry: None  . Food insecurity - inability: None  . Transportation needs - medical: None  . Transportation needs - non-medical: None  Occupational History  . None  Tobacco Use  . Smoking status: Never Smoker  . Smokeless tobacco: Never Used  Substance and Sexual Activity  . Alcohol use: No  . Drug use: No  . Sexual activity: No  Other Topics Concern  . None  Social History Narrative  .  None   Additional Social History:                         Sleep: Good  Appetite:  Poor  Current Medications: Current Facility-Administered Medications  Medication Dose Route Frequency Provider Last Rate Last Dose  . acetaminophen (TYLENOL) tablet 650 mg  650 mg Oral Q6H PRN Rankin, Shuvon B, NP      . alum & mag hydroxide-simeth (MAALOX/MYLANTA) 200-200-20 MG/5ML suspension 30 mL  30 mL Oral Q4H PRN Rankin, Shuvon B, NP      . clonazePAM (KLONOPIN) tablet 0.5 mg  0.5 mg Oral Daily Eksir, Bo Mcclintock, MD      . feeding supplement (ENSURE ENLIVE) (ENSURE ENLIVE) liquid 237 mL  237 mL Oral TID BM Rankin, Shuvon B, NP      . magnesium hydroxide  (MILK OF MAGNESIA) suspension 30 mL  30 mL Oral Daily PRN Rankin, Shuvon B, NP      . OLANZapine (ZYPREXA) tablet 10 mg  10 mg Oral QHS Eksir, Bo Mcclintock, MD        Lab Results: No results found for this or any previous visit (from the past 48 hour(s)).  Blood Alcohol level:  Lab Results  Component Value Date   ETH <10 05/11/2017   ETH <10 05/07/2017    Metabolic Disorder Labs: Lab Results  Component Value Date   HGBA1C 5.5 06/21/2012   MPG 111 06/21/2012   No results found for: PROLACTIN Lab Results  Component Value Date   CHOL 166 06/21/2012   TRIG 58 06/21/2012   HDL 58 06/21/2012   CHOLHDL 2.9 06/21/2012   VLDL 12 06/21/2012   LDLCALC 96 06/21/2012    Physical Findings: AIMS: Facial and Oral Movements Muscles of Facial Expression: None, normal Lips and Perioral Area: None, normal Jaw: None, normal Tongue: None, normal,Extremity Movements Upper (arms, wrists, hands, fingers): None, normal Lower (legs, knees, ankles, toes): None, normal, Trunk Movements Neck, shoulders, hips: None, normal, Overall Severity Severity of abnormal movements (highest score from questions above): None, normal Incapacitation due to abnormal movements: None, normal Patient's awareness of abnormal movements (rate only patient's report): No Awareness, Dental Status Current problems with teeth and/or dentures?: No Does patient usually wear dentures?: No  CIWA:    COWS:     Musculoskeletal: Strength & Muscle Tone: within normal limits Gait & Station: normal Patient leans: N/A  Psychiatric Specialty Exam: Physical Exam  Review of Systems  Psychiatric/Behavioral: Negative for depression, hallucinations, memory loss, substance abuse and suicidal ideas. The patient is not nervous/anxious and does not have insomnia.   All other systems reviewed and are negative.   Blood pressure (!) 97/52, pulse (!) 107, temperature 98.4 F (36.9 C), temperature source Oral, resp. rate 16, height 5\' 2"   (1.575 m), weight 87 lb (39.5 kg).Body mass index is 15.91 kg/m.  General Appearance: Disheveled  Eye Contact:  Good  Speech:  Clear and Coherent  Volume:  Normal  Mood:  I want my job   Affect:  Appropriate, Restricted, Tearful and down  Thought Process:  Coherent but tangential at times  Orientation:  Full (Time, Place, and Person)  Thought Content:  Rumination  Suicidal Thoughts:  No  Homicidal Thoughts:  No  Memory:  Immediate;   Good Recent;   Good Remote;   Good  Judgement:  Impaired  Insight:  Lacking  Psychomotor Activity:  Normal  Concentration:  Concentration: Good and Attention Span: Good  Recall:  Good  Fund of Knowledge:  Good  Language:  Good  Akathisia:  No  Handed:  Right  AIMS (if indicated):     Assets:  Physical Health  ADL's:  Intact  Cognition:  WNL  Sleep:  Number of Hours: 6.75   Yvonne [name] is a 51 year old female with bipolar I disorder with multiple psychiatric hospitalizations, who presented to ED for agitation, psychosis on 10.31 in the setting of non adherence to medication. UDS negative. [facility]   Exam is notable for rumination on losing her job and lack of insight into her mental state. She demonstrates tangential thought process at times, although her psychomotor appears to be significantly improved compared to the evaluation by this note Clinical research associatewriter when she was at ED. She is gravely disabled and had been refusing to eat meals. Will continue olanzapine, clonazepam for bipolar disorder. Will consider non emergency forced medication if she were to continue to refuse taking medication. She is strongly encouraged to eat meals and come to the day room during the day.  - Continue olanzapine 10 mg qhs - Continue clonazepam 0.5 mg daily - TSH wnl, preg negative - last EKG 11/11 QTc 415 msec  Treatment Plan Summary: Daily contact with patient to assess and evaluate symptoms and progress in treatment  Neysa Hottereina Drevon Plog,  MD 06/03/2017, 8:36 AM

## 2017-06-03 NOTE — Tx Team (Signed)
Interdisciplinary Treatment and Diagnostic Plan Update  06/03/2017 Time of Session: 3:15 PM  Yvonne BalsamMelvine [name] MRN: [id number]  Principal Diagnosis: Bipolar I disorder, current or most recent episode manic, with psychotic features (HCC)  Secondary Diagnoses: Principal Problem:   Bipolar I disorder, current or most recent episode manic, with psychotic features (HCC) Active Problems:   Bipolar disorder, curr episode depressed, severe, w/psychotic features (HCC)   Current Medications:  Current Facility-Administered Medications  Medication Dose Route Frequency Provider Last Rate Last Dose  . acetaminophen (TYLENOL) tablet 650 mg  650 mg Oral Q6H PRN Rankin, Shuvon B, NP      . alum & mag hydroxide-simeth (MAALOX/MYLANTA) 200-200-20 MG/5ML suspension 30 mL  30 mL Oral Q4H PRN Rankin, Shuvon B, NP      . clonazePAM (KLONOPIN) tablet 0.5 mg  0.5 mg Oral Daily Eksir, Bo McclintockAlexander Arya, MD      . feeding supplement (ENSURE ENLIVE) (ENSURE ENLIVE) liquid 237 mL  237 mL Oral TID BM Rankin, Shuvon B, NP      . magnesium hydroxide (MILK OF MAGNESIA) suspension 30 mL  30 mL Oral Daily PRN Rankin, Shuvon B, NP      . OLANZapine (ZYPREXA) tablet 10 mg  10 mg Oral QHS Eksir, Bo McclintockAlexander Arya, MD        PTA Medications: Medications Prior to Admission  Medication Sig Dispense Refill Last Dose  . ARIPiprazole (ABILIFY) 10 MG tablet Take 1 tablet (10 mg total) by mouth daily. 30 tablet 0 See note at ??  . carbamazepine (TEGRETOL XR) 200 MG 12 hr tablet Take 1 tablet (200 mg total) by mouth 2 (two) times daily. For mood stabilization 60 tablet 0 See note at ??  . hydrOXYzine (ATARAX/VISTARIL) 25 MG tablet Take 1 tablet (25 mg total) by mouth every 6 (six) hours as needed for anxiety. 60 tablet 0 See note at ??  . propranolol (INDERAL) 10 MG tablet Take 1 tablet (10 mg total) by mouth 2 (two) times daily. For anxiety 60 tablet 0 See note at ??  . traZODone (DESYREL) 50 MG tablet Take 1 tablet (50 mg total) by mouth  at bedtime as needed for sleep. 30 tablet 0 See note at ??    Patient Stressors: Financial difficulties Medication change or noncompliance Other: "I'm homeless"  Patient Strengths: Average or above average intelligence Communication skills General fund of knowledge  Treatment Modalities: Medication Management, Group therapy, Case management,  1 to 1 session with clinician, Psychoeducation, Recreational therapy.   Physician Treatment Plan for Primary Diagnosis: Bipolar I disorder, current or most recent episode manic, with psychotic features (HCC) Long Term Goal(s): Improvement in symptoms so as ready for discharge  Short Term Goals: Ability to identify changes in lifestyle to reduce recurrence of condition will improve Ability to verbalize feelings will improve Ability to disclose and discuss suicidal ideas Ability to demonstrate self-control will improve Ability to identify and develop effective coping behaviors will improve Ability to maintain clinical measurements within normal limits will improve Compliance with prescribed medications will improve Ability to identify triggers associated with substance abuse/mental health issues will improve Ability to identify changes in lifestyle to reduce recurrence of condition will improve Ability to verbalize feelings will improve Ability to disclose and discuss suicidal ideas Ability to demonstrate self-control will improve Ability to identify and develop effective coping behaviors will improve Ability to maintain clinical measurements within normal limits will improve Compliance with prescribed medications will improve Ability to identify triggers associated with substance abuse/mental health issues  will improve  Medication Management: Evaluate patient's response, side effects, and tolerance of medication regimen.  Therapeutic Interventions: 1 to 1 sessions, Unit Group sessions and Medication administration.  Evaluation of Outcomes:  Progressing  Physician Treatment Plan for Secondary Diagnosis: Principal Problem:   Bipolar I disorder, current or most recent episode manic, with psychotic features (HCC) Active Problems:   Bipolar disorder, curr episode depressed, severe, w/psychotic features (HCC)   Long Term Goal(s): Improvement in symptoms so as ready for discharge  Short Term Goals: Ability to identify changes in lifestyle to reduce recurrence of condition will improve Ability to verbalize feelings will improve Ability to disclose and discuss suicidal ideas Ability to demonstrate self-control will improve Ability to identify and develop effective coping behaviors will improve Ability to maintain clinical measurements within normal limits will improve Compliance with prescribed medications will improve Ability to identify triggers associated with substance abuse/mental health issues will improve Ability to identify changes in lifestyle to reduce recurrence of condition will improve Ability to verbalize feelings will improve Ability to disclose and discuss suicidal ideas Ability to demonstrate self-control will improve Ability to identify and develop effective coping behaviors will improve Ability to maintain clinical measurements within normal limits will improve Compliance with prescribed medications will improve Ability to identify triggers associated with substance abuse/mental health issues will improve  Medication Management: Evaluate patient's response, side effects, and tolerance of medication regimen.  Therapeutic Interventions: 1 to 1 sessions, Unit Group sessions and Medication administration.  Evaluation of Outcomes: Progressing   RN Treatment Plan for Primary Diagnosis: Bipolar I disorder, current or most recent episode manic, with psychotic features (HCC) Long Term Goal(s): Knowledge of disease and therapeutic regimen to maintain health will improve  Short Term Goals: Compliance with prescribed  medications will improve  Medication Management: RN will administer medications as ordered by provider, will assess and evaluate patient's response and provide education to patient for prescribed medication. RN will report any adverse and/or side effects to prescribing provider.  Therapeutic Interventions: 1 on 1 counseling sessions, Psychoeducation, Medication administration, Evaluate responses to treatment, Monitor vital signs and CBGs as ordered, Perform/monitor CIWA, COWS, AIMS and Fall Risk screenings as ordered, Perform wound care treatments as ordered.  Evaluation of Outcomes: Progressing   LCSW Treatment Plan for Primary Diagnosis: Bipolar I disorder, current or most recent episode manic, with psychotic features (HCC) Long Term Goal(s): Safe transition to appropriate next level of care at discharge, Engage patient in therapeutic group addressing interpersonal concerns.  Short Term Goals: Engage patient in aftercare planning with referrals and resources, Increase social support and Facilitate acceptance of mental health diagnosis and concerns  Therapeutic Interventions: Assess for all discharge needs, 1 to 1 time with Social worker, Explore available resources and support systems, Assess for adequacy in community support network, Educate family and significant other(s) on suicide prevention, Complete Psychosocial Assessment, Interpersonal group therapy.  Evaluation of Outcomes: Progressing   Progress in Treatment: Attending groups: Yes Participating in groups: No Taking medication as prescribed: Yes Toleration medication: Yes, no side effects reported at this time Family/Significant other contact made: Not yet Patient understands diagnosis: No, Limited insight Discussing patient identified problems/goals with staff: Yes Medical problems stabilized or resolved: Yes Denies suicidal/homicidal ideation: Yes Issues/concerns per patient self-inventory: None Other: N/A  New problem(s)  identified: None identified at this time.   New Short Term/Long Term Goal(s): None identified at this time.   Discharge Plan or Barriers:Her current plan is to find somewhere to stay, find a job  and apply for SSI disability.  Reason for Continuation of Hospitalization: Anxiety Depression Mania Medication Stabilization  Estimated Length of Stay: 06/08/2017  Attendees: Patient: Yvonne [name] 06/03/2017  3:15 PM Physician: Neysa Hotter, MD        06/03/2017  3:15 PM Nursing:            06/03/2017  3:15 PM RN Care Manager:     06/03/2017  3:15 PM Social Worker: Johny Shears LCSWA           06/03/2017  3:15 PM Recreational Therapist:        06/03/2017  3:15 PM Other:      06/03/2017  3:15 PM Other:   06/03/2017  3:15 PM   Scribe for Treatment Team: Johny Shears LCSWA 06/03/2017 3:15 PM

## 2017-06-04 MED ORDER — OLANZAPINE 10 MG IM SOLR
5.0000 mg | Freq: Every day | INTRAMUSCULAR | Status: DC
  Filled 2017-06-04 (×3): qty 10

## 2017-06-04 NOTE — Progress Notes (Signed)
Dar Note: Patient presents with flat affect and depressed mood.  Refused morning Klonopin after several encouragements.  Food and fluid intake encouraged.  Patient visible in the dayroom interacting and socializing with peers.  Routine safety checks maintained.  Patient safe on the unit.  Denies suicidal thoughts, auditory and visual hallucinations.  Preoccupied with finding placement before discharge.

## 2017-06-04 NOTE — Plan of Care (Signed)
Pt remains a low fall risk, denies SI at this time.  

## 2017-06-04 NOTE — [facility] Group Notes (Signed)
[facility] LCSW Group Therapy Note  Date/Time:    06/04/2017 11:15AM-12:00PM  Type of Therapy and Topic:  Group Therapy:  Healthy vs Unhealthy Coping Skills  Participation Level:  Minimal   Description of Group:  The focus of this group was to determine what unhealthy coping techniques typically are used by group members and what healthy coping techniques would be helpful in coping with various problems.  In particular we addressed the unhealthy coping techniques typically used on a rainy, gloomy day like today, and what would be a healthier coping method to use. Patients were guided in becoming aware of the differences between healthy and unhealthy coping techniques.   Patients were encouraged to consider the changes they can make in their lives including taking medication, telling their doctor when their medicine is not working, introducing new hobbies, creating a schedule and many more.  Therapeutic Goals 1. Patients learned that coping is what human beings do all day long to deal with various situations in their lives 2. Patients defined and discussed healthy vs unhealthy coping techniques 3. Patients identified their preferred coping techniques and identified whether these were healthy or unhealthy 4. Patients determined 1-2 healthy coping skills they would like to become more familiar with and use more often 5. Patients provided support and ideas to each other  Summary of Patient Progress: During group, patient expressed she is very upset today because she has lost everything including her house and car.  She initially shared that she could not think of healthy coping skills to use because she is focused on her losses.  She later did agree that meditation and "staying in the moment" while in the hospital could be helpful.   Therapeutic Modalities Cognitive Behavioral Therapy Motivational Interviewing

## 2017-06-04 NOTE — Progress Notes (Signed)
[facility] Second Phys[facility] Opinion Progress Note for Medication Administration to Non-consenting Patients (For Involuntarily Committed Patients)  Patient: Yvonne BalsamMelvine [name] Date of Birth: [id number] MRN: [id number]  Reason for the Medication: The patient, without the benefit of the specific treatment measure, is incapable of participating in any available treatment plan that will give the patient a realistic opportunity of improving the patient's condition.  Consideration of Side Effects: Consideration of the side effects related to the medication plan has been given.  Rationale for Medication Administration:  51 y.o AAF with long history of  Bipolar Disorder. She was brought in by the police. She has been staying at an property she believes belongs to her father. She has been getting agitated and erratic. Patient has delusions about being mistreated by Yvonne [name]. Feels she worked there since she was [age] years of age. Patient refers to some of her caregivers as her father. At the same time talks about her deceased father.   Patient has no grasp of reality. She has not been adherent with prescribed medication. She has no insight into her illness.  I believe that she is at risk of negligence if not properly treated. I agree with her treating psychiatrist for forced medication with Olanzapine which she responded well to in the past.     Yvonne CockerVincent A Blanca Carreon, MD 06/04/17  3:00 PM   This documentation is good for (7) seven days from the date of the MD signature. New documentation must be completed every seven (7) days with detailed justification in the medical record if the patient requires continued non-emergent administration of psychotropic medications.

## 2017-06-04 NOTE — Progress Notes (Addendum)
[facility] Progress Note  06/04/2017 8:49 AM Yvonne [name]  MRN:  [id number] Subjective:   Patient seen, chart reviewed and case discussed with nursing staff.  She has very poor appetite and has been refusing to eat. She took Gatorade after multiple encouragement. She is isolative and ruminates on her father who was deceased. Orthostatic test negative this morning (HR raised by 11)  She states that her son "made a record on radio number one." She said that it was "double platinum" and she needs to get that record so that she can get money. She states that she needs to get a job. She then talks about group, which usually held on weekday. She states that her daughter is in [employer], and "you play my song. I don't know how you do about it." She insisted that she has been eating well and drank Gatorade without any problems. She does not like a taste of food. She denies paranoia in relation to her food. She adamantly denies taking medication, stating that she has not taken medication since 2015. She denies feeling depressed. She denies SI, HI, AH, VH. She denies dizziness.  Wt Readings from Last 3 Encounters:  06/01/17 87 lb (39.5 kg)  05/31/17 87 lb (39.5 kg)  04/22/17 85 lb (38.6 kg)     Principal Problem: Bipolar I disorder, current or most recent episode manic, with psychotic features (HCC) Diagnosis:   Patient Active Problem List   Diagnosis Date Noted  . Bipolar disorder, curr episode depressed, severe, w/psychotic features (HCC) [F31.5] 06/01/2017  . Bipolar I disorder, current or most recent episode manic, with psychotic features (HCC) [F31.2]   . Bipolar affective disorder, current episode mild (HCC) [F31.9] 01/05/2017  . Manic behavior (HCC) [F30.10]   . Facial cellulitis [L03.211] 02/10/2014   Total Time spent with patient: 15 minutes  Past Psychiatric History: See initial H&P for dull details. Reviewed, with no updates at this time.   Past Medical History:  Past Medical History:   Diagnosis Date  . Bipolar 1 disorder (HCC)   . Medical history non-contributory   . Mental disorder   . No pertinent past medical history     Past Surgical History:  Procedure Laterality Date  . NO PAST SURGERIES     Family History:  Family History  Problem Relation Age of Onset  . Depression Sister   . Depression Brother   . Depression Sister   . CAD Mother   . Hypertension Father   . Diabetes Father   . Bipolar disorder Cousin    Family Psychiatric  History: See initial H&P for dull details. Reviewed, with no updates at this time.  Social History:  Social History   Substance and Sexual Activity  Alcohol Use No     Social History   Substance and Sexual Activity  Drug Use No    Social History   Socioeconomic History  . Marital status: Single    Spouse name: None  . Number of children: None  . Years of education: None  . Highest education level: None  Social Needs  . Financial resource strain: None  . Food insecurity - worry: None  . Food insecurity - inability: None  . Transportation needs - medical: None  . Transportation needs - non-medical: None  Occupational History  . None  Tobacco Use  . Smoking status: Never Smoker  . Smokeless tobacco: Never Used  Substance and Sexual Activity  . Alcohol use: No  . Drug use: No  .  Sexual activity: No  Other Topics Concern  . None  Social History Narrative  . None   Additional Social History:                         Sleep: Good  Appetite:  Poor  Current Medications: Current Facility-Administered Medications  Medication Dose Route Frequency Provider Last Rate Last Dose  . acetaminophen (TYLENOL) tablet 650 mg  650 mg Oral Q6H PRN Rankin, Shuvon B, NP      . alum & mag hydroxide-simeth (MAALOX/MYLANTA) 200-200-20 MG/5ML suspension 30 mL  30 mL Oral Q4H PRN Rankin, Shuvon B, NP      . clonazePAM (KLONOPIN) tablet 0.5 mg  0.5 mg Oral Daily Eksir, Bo Mcclintock, MD      . feeding supplement  (ENSURE ENLIVE) (ENSURE ENLIVE) liquid 237 mL  237 mL Oral TID BM Rankin, Shuvon B, NP      . magnesium hydroxide (MILK OF MAGNESIA) suspension 30 mL  30 mL Oral Daily PRN Rankin, Shuvon B, NP      . OLANZapine (ZYPREXA) tablet 10 mg  10 mg Oral QHS Eksir, Bo Mcclintock, MD        Lab Results: No results found for this or any previous visit (from the past 48 hour(s)).  Blood Alcohol level:  Lab Results  Component Value Date   ETH <10 05/11/2017   ETH <10 05/07/2017    Metabolic Disorder Labs: Lab Results  Component Value Date   HGBA1C 5.5 06/21/2012   MPG 111 06/21/2012   No results found for: PROLACTIN Lab Results  Component Value Date   CHOL 166 06/21/2012   TRIG 58 06/21/2012   HDL 58 06/21/2012   CHOLHDL 2.9 06/21/2012   VLDL 12 06/21/2012   LDLCALC 96 06/21/2012    Physical Findings: AIMS: Facial and Oral Movements Muscles of Facial Expression: None, normal Lips and Perioral Area: None, normal Jaw: None, normal Tongue: None, normal,Extremity Movements Upper (arms, wrists, hands, fingers): None, normal Lower (legs, knees, ankles, toes): None, normal, Trunk Movements Neck, shoulders, hips: None, normal, Overall Severity Severity of abnormal movements (highest score from questions above): None, normal Incapacitation due to abnormal movements: None, normal Patient's awareness of abnormal movements (rate only patient's report): No Awareness, Dental Status Current problems with teeth and/or dentures?: No Does patient usually wear dentures?: No  CIWA:    COWS:     Musculoskeletal: Strength & Muscle Tone: within normal limits Gait & Station: normal Patient leans: N/A  Psychiatric Specialty Exam: Physical Exam  Review of Systems  Psychiatric/Behavioral: Negative for depression, hallucinations, memory loss, substance abuse and suicidal ideas. The patient is not nervous/anxious and does not have insomnia.   All other systems reviewed and are negative.   Blood  pressure 103/69, pulse (!) 104, temperature 98.4 F (36.9 C), temperature source Oral, resp. rate 16, height 5\' 2"  (1.575 m), weight 87 lb (39.5 kg).Body mass index is 15.91 kg/m.  General Appearance: Disheveled  Eye Contact:  Good  Speech:  Clear and Coherent  Volume:  Decreased  Mood:  "not depressed"  Affect:  Blunt  Thought Process:  Disorganized  Orientation:  Full (Time, Place, and Person)  Thought Content:  Rumination Perceptions: denies AH/VH  Suicidal Thoughts:  No  Homicidal Thoughts:  No  Memory:  Immediate;   Fair  Judgement:  Impaired  Insight:  Lacking  Psychomotor Activity:  Normal  Concentration:  Concentration: Fair and Attention Span: Fair  Recall:  difficult  to assess due to her current mental state  Fund of Knowledge:  difficult to assess due to her current mental state  Language:  Good  Akathisia:  No  Handed:  Right  AIMS (if indicated):     Assets:  Physical Health  ADL's:  Intact  Cognition:  WNL  Sleep:  Number of Hours: 6.75    Yvonne [name] is a [age] year old female with bipolar 1 disorder with multiple psychiatric hospitalizations, who presented to ED on 10/31 for agitation, psychosis in the setting of non adherence to medication. UDS negative. [facility].   Exam is notable for continued rumination on losing her job, loss of her father and she demonstrates tangential and disorganized thought process.  She lacks insight into her current mental state. Per nursing report, she has been refusing to eat and been drinking little water, although she denies this during the interview. She has been refusing to take medication since admission. She is gravely disabled and is at risk to herself given she is unable to take care of herself. Will have non emergent forced medication to target psychotic features. Will discuss with other provider in the unit. Will discontinue clonazepam given she is at risk of fall given her limited PO intake.    Reviewed and updated the plan as below - Continue olanzapine 10 mg qhs. If she refuses it, will have olanzapine 5 mg IM (will have lower dose to minimize the risk of orthostatic hypotension); discussed with Dr. Guss BundeIzeduino, who is in agreement with plan. - Discontinue clonazepam - Check EKG 11/25. last EKG 11/11 QTc 415 msec - Recheck CBC, CMP tomorrow - TSH wnl, preg negative  Treatment Plan Summary: Daily contact with patient to assess and evaluate symptoms and progress in treatment  Neysa Hottereina Seylah Wernert, MD 06/04/2017, 8:49 AM

## 2017-06-05 MED ORDER — ADULT MULTIVITAMIN W/MINERALS CH
1.0000 | ORAL_TABLET | Freq: Every day | ORAL | Status: DC
  Administered 2017-06-05 – 2017-06-22 (×18): 1 via ORAL
  Filled 2017-06-05 (×21): qty 1

## 2017-06-05 MED ORDER — OLANZAPINE 10 MG PO TABS
10.0000 mg | ORAL_TABLET | Freq: Every day | ORAL | Status: DC
  Administered 2017-06-05: 10 mg via ORAL
  Filled 2017-06-05 (×2): qty 1

## 2017-06-05 MED ORDER — OLANZAPINE 10 MG IM SOLR
5.0000 mg | Freq: Every day | INTRAMUSCULAR | Status: DC
  Filled 2017-06-05: qty 10

## 2017-06-05 MED ORDER — OLANZAPINE 10 MG IM SOLR
10.0000 mg | Freq: Every day | INTRAMUSCULAR | Status: DC
  Filled 2017-06-05 (×2): qty 10

## 2017-06-05 MED ORDER — OLANZAPINE 10 MG IM SOLR
10.0000 mg | Freq: Every day | INTRAMUSCULAR | Status: DC
  Filled 2017-06-05: qty 10

## 2017-06-05 MED ORDER — OLANZAPINE 10 MG PO TABS
10.0000 mg | ORAL_TABLET | Freq: Every day | ORAL | Status: DC
  Filled 2017-06-05: qty 1

## 2017-06-05 NOTE — Progress Notes (Signed)
  DATA ACTION RESPONSE  Objective- Pt. is visible in the room, seen resting in bed with eyes closed. Pt was awaken for assessment.Presents with an agitated/irrtable/flat/paranoid affect and mood. Initially, Pt refused PO medications. Pt states "Nothing is wrong with me; I am homeless and needed a place to stay". After multiple attempts and encouragement; Pt willingly took PO. Pt is isolative to room.Pt was encourage to push fluids. Subjective- Denies having any SI/HI/AVH/Pain at this time. Is cooperative and remain safe on the unit.  1:1 interaction in private to establish rapport. Encouragement, education, & support given from staff.  Ensure refused at bedtime.   Safety maintained with Q 15 checks. Continue with POC.

## 2017-06-05 NOTE — Progress Notes (Addendum)
Patient went to dining room for dinner, stated she ate approximately 70% of her dinner.     Patient's self inventory sheet, patient slept good last night, no sleep medication.  Good appetite, normal energy level, good concentration.  Denied depression, hopeless and anxiety.  Denied withdrawals.  Denied SI.  Denied physical problems.  Denied physical pain.  Goal is to be discharged and have place to live and have money.  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  "When my daddy died, my life hit rock bottom."  No discharge plans.  After returning from dining room, patient went back to her bed.  Patient has refused EKG and labs today.

## 2017-06-05 NOTE — Progress Notes (Signed)
[facility]  06/05/2017 2:50 PM Yvonne BalsamMelvine [name]  MRN:  [id number] Subjective:   Patient seen, chart reviewed and case discussed with nursing staff. She has been refusing to eat meals; took olanzapine last night.   She states that "nothing happened" although she attended groups. When she is asked to elaborate it, she ruminates on being discharged soon and wants to get a job. She wants to talk with SW to apply for disability. She states that she is here as she is a homeless. She denies any need to take medication or need for treatment. When she is asked about poor appetite, she states that "food is food" and insisted that she has been eating meals. She denies SI HI, AH.VH.   Wt Readings from Last 3 Encounters:  06/01/17 87 lb (39.5 kg)  05/31/17 87 lb (39.5 kg)  04/22/17 85 lb (38.6 kg)   Principal Problem: Bipolar I disorder, current or most recent episode manic, with psychotic features (HCC) Diagnosis:   Patient Active Problem List   Diagnosis Date Noted  . Bipolar disorder, curr episode depressed, severe, w/psychotic features (HCC) [F31.5] 06/01/2017  . Bipolar I disorder, current or most recent episode manic, with psychotic features (HCC) [F31.2]   . Bipolar affective disorder, current episode mild (HCC) [F31.9] 01/05/2017  . Manic behavior (HCC) [F30.10]   . Facial cellulitis [L03.211] 02/10/2014   Total Time spent with patient: 15 minutes  Past Psychiatric History: See initial H&P for dull details. Reviewed, with no updates at this time.   Past Medical History:  Past Medical History:  Diagnosis Date  . Bipolar 1 disorder (HCC)   . Medical history non-contributory   . Mental disorder   . No pertinent past medical history     Past Surgical History:  Procedure Laterality Date  . NO PAST SURGERIES     Family History:  Family History  Problem Relation Age of Onset  . Depression Sister   . Depression Brother   . Depression Sister   . CAD Mother   . Hypertension  Father   . Diabetes Father   . Bipolar disorder Cousin    Family Psychiatric  History: See initial H&P for dull details. Reviewed, with no updates at this time.  Social History:  Social History   Substance and Sexual Activity  Alcohol Use No     Social History   Substance and Sexual Activity  Drug Use No    Social History   Socioeconomic History  . Marital status: Single    Spouse name: None  . Number of children: None  . Years of education: None  . Highest education level: None  Social Needs  . Financial resource strain: None  . Food insecurity - worry: None  . Food insecurity - inability: None  . Transportation needs - medical: None  . Transportation needs - non-medical: None  Occupational History  . None  Tobacco Use  . Smoking status: Never Smoker  . Smokeless tobacco: Never Used  Substance and Sexual Activity  . Alcohol use: No  . Drug use: No  . Sexual activity: No  Other Topics Concern  . None  Social History Narrative  . None   Additional Social History:                         Sleep: Fair  Appetite:  Poor  Current Medications: Current Facility-Administered Medications  Medication Dose Route Frequency Provider Last Rate Last Dose  .  acetaminophen (TYLENOL) tablet 650 mg  650 mg Oral Q6H PRN Rankin, Shuvon B, NP      . alum & mag hydroxide-simeth (MAALOX/MYLANTA) 200-200-20 MG/5ML suspension 30 mL  30 mL Oral Q4H PRN Rankin, Shuvon B, NP      . feeding supplement (ENSURE ENLIVE) (ENSURE ENLIVE) liquid 237 mL  237 mL Oral TID BM Rankin, Shuvon B, NP      . magnesium hydroxide (MILK OF MAGNESIA) suspension 30 mL  30 mL Oral Daily PRN Rankin, Shuvon B, NP      . multivitamin with minerals tablet 1 tablet  1 tablet Oral Daily Cobos, [name], MD   1 tablet at 06/05/17 1256  . OLANZapine (ZYPREXA) injection 5 mg  5 mg Intramuscular QHS Omunique Pederson, MD      . OLANZapine (ZYPREXA) tablet 10 mg  10 mg Oral QHS Burnard LeighEksir, Alexander Arya, MD   10 mg  at 06/04/17 2056    Lab Results: No results found for this or any previous visit (from the past 48 hour(s)).  Blood Alcohol level:  Lab Results  Component Value Date   ETH <10 05/11/2017   ETH <10 05/07/2017    Metabolic Disorder Labs: Lab Results  Component Value Date   HGBA1C 5.5 06/21/2012   MPG 111 06/21/2012   No results found for: PROLACTIN Lab Results  Component Value Date   CHOL 166 06/21/2012   TRIG 58 06/21/2012   HDL 58 06/21/2012   CHOLHDL 2.9 06/21/2012   VLDL 12 06/21/2012   LDLCALC 96 06/21/2012    Physical Findings: AIMS: Facial and Oral Movements Muscles of Facial Expression: None, normal Lips and Perioral Area: None, normal Jaw: None, normal Tongue: None, normal,Extremity Movements Upper (arms, wrists, hands, fingers): None, normal Lower (legs, knees, ankles, toes): None, normal, Trunk Movements Neck, shoulders, hips: None, normal, Overall Severity Severity of abnormal movements (highest score from questions above): None, normal Incapacitation due to abnormal movements: None, normal Patient's awareness of abnormal movements (rate only patient's report): No Awareness, Dental Status Current problems with teeth and/or dentures?: No Does patient usually wear dentures?: No  CIWA:  CIWA-Ar Total: 1 COWS:  COWS Total Score: 2  Musculoskeletal: Strength & Muscle Tone: within normal limits Gait & Station: normal Patient leans: N/A  Psychiatric Specialty Exam: Physical Exam  Review of Systems  Psychiatric/Behavioral: Negative for depression, hallucinations, memory loss, substance abuse and suicidal ideas. The patient is not nervous/anxious and does not have insomnia.   All other systems reviewed and are negative.   Blood pressure 96/67, pulse 95, temperature 98.6 F (37 C), temperature source Oral, resp. rate 18, height 5\' 2"  (1.575 m), weight 87 lb (39.5 kg).Body mass index is 15.91 kg/m.  General Appearance: Disheveled  Eye Contact:  Fair   Speech:  mumbles  Volume:  Decreased  Mood:  "fine"  Affect:  Flat  Thought Process:  Disorganized  Orientation:  Full (Time, Place, and Person)  Thought Content:  Rumination  Suicidal Thoughts:  No  Homicidal Thoughts:  No  Memory:  Immediate;   Fair  Judgement:  Poor  Insight:  Lacking  Psychomotor Activity:  Decreased  Concentration:  Concentration: Fair and Attention Span: Fair  Recall:  [employer]  Fund of Knowledge:  Poor  Language:  Good  Akathisia:  No  Handed:  Right  AIMS (if indicated):     Assets:  Physical Health  ADL's:  Intact  Cognition:  WNL  Sleep:  Number of Hours: 6.75   Atlee  Reddoch is a 51 year old female with bipolar 1 disorder with multiple psychiatric hospitalizations, who presented to the ED on 1031 for agitation, psychosis in the setting of non adherence to medication. UDS negative. Admitted to IVC to [facility] for continued care.   Exam is notable for continued rumination on losing her job, and she demonstrates tangential and disorganized thought process. she lacks insight into her current medical status and has been refusing to eat.  She will be continued on non-emergent forced medication given she is gravely disabled, while she started to take medication since yesterday.   Reviewed and updated plan as below - Continue olanzapine 10 mg qhs. If she refuses it, will have olanzapine 10 mg IM  - Pending EKG. last EKG 11/11 QTc 415 msec - Pending CBC, CMP  - TSH wnl, preg negative  Treatment Plan Summary: Daily contact with patient to assess and evaluate symptoms and progress in treatment  Neysa Hotter, MD 06/05/2017, 2:50 PM

## 2017-06-05 NOTE — Progress Notes (Signed)
Writer observed patient lying in the bed resting but not asleep with the lights off. Writer introduced myself to her and informed her of medication scheduled. She complained that she does not need any medication and all she needed was her disability and a place to stay because she is homeless. Writer encouraged her to take her medicine in pill form and she agreed. Writer informed her to talk with her doctor on tomorrow about her medication if feeling she does not need it. She was encouraged to eat a snack and fluids given before she returned to her room. Safety maintained on unit with 15 min checks.

## 2017-06-05 NOTE — Progress Notes (Signed)
Pt refused labs and EKG this a.m. Pt becomes irritable when staff encourages Pt. Will pass on to day team to try again.

## 2017-06-05 NOTE — [facility] Group Notes (Signed)
[facility]  Date/Time:  06/05/2017  11:00AM-12:00PM  Type of Therapy and Topic:  Group Therapy:  Music and Mood  Participation Level:  Active   Description of Group: In this process group, members listened to a variety of genres of music and identified that different types of music evoke different responses.  Patients were encouraged to identify music that was soothing for them and music that was energizing for them.  Patients discussed how this knowledge can help with wellness and recovery in various ways including managing depression and anxiety as well as encouraging healthy sleep habits.    Therapeutic Goals: 1. Patients will explore the impact of different varieties of music on mood 2. Patients will verbalize the thoughts they have when listening to different types of music 3. Patients will identify music that is soothing to them as well as music that is energizing to them 4. Patients will discuss how to use this knowledge to assist in maintaining wellness and recovery 5. Patients will explore the use of music as a coping skill  Summary of Patient Progress:  At the beginning of group, patient expressed that she felt relaxed after taking a shower, but was anxious about her discharge plan, not wanting to stay in the hospital long but also not wanting to discharge without a plan.  At the end of group, she stated she felt "soothed."  Therapeutic Modalities: Solution Focused Brief Therapy Motivational Interviewing Activity   Ambrose MantleMareida Grossman-Orr, LCSW 06/05/2017 8:29 AM

## 2017-06-05 NOTE — Plan of Care (Signed)
Nurse discussed depression/anxiety/coping skills with patient. 

## 2017-06-05 NOTE — Progress Notes (Signed)
Adult Psychoeducational Group Note  Date:  06/05/2017 Time:  11:36 PM  Group Topic/Focus:  Wrap-Up Group:   The focus of this group is to help patients review their daily goal of treatment and discuss progress on daily workbooks.  Participation Level:  Did Not Attend  Participation Quality:  Did Not Attend  Affect:  Did Not Attend  Cognitive:  Did Not Attend  Insight: None  Engagement in Group:  Did Not Attend  Modes of Intervention:  Did Not Attend  Additional Comments:  Pt did not attend evening wrap up group tonight.  Yvonne FurnaceChristopher  [name] 06/05/2017, 11:36 PM

## 2017-06-05 NOTE — Progress Notes (Addendum)
D:  Patient denied SI and HI while talking to nurse, contracts for safety.  Denied A/V hallucinations.  Safety maintained with 15 minute checks. A:  Patient did take her multivitamin after lunch.  Patient has refused ensure today.  Patient did eat nutragrain bar for breakfast and went to dining room for lunch, stated she ate 50% of her lunch.  Patient has drank several cups of ginger ale today.  Patient took nap this morning and is sleeping after lunch.  Staff will continue to push fluids/food for patient.   R:  Safety maintained with 15 minute checks. Patient admits she is very concerned about being homeless, that she spent money after her dad died, and does not know how she ran out of money.  That she lost her job and needs to be on disability so she will have a place to live. Patient has been pleasant today.

## 2017-06-05 NOTE — Progress Notes (Signed)
NUTRITION ASSESSMENT  Pt identified as at risk on the Malnutrition Screen Tool  INTERVENTION: 1. Multivitamin with minerals daily 2. Supplements: Ensure Enlive po TID, each supplement provides 350 kcal and 20 grams of protein  NUTRITION DIAGNOSIS: Unintentional weight loss related to sub-optimal intake as evidenced by pt report.   Goal: Pt to meet >/= 90% of their estimated nutrition needs.  Monitor:  PO intake  Assessment:  Pt admitted with bipolar disorder and manic behavior. Pt was last seen by nutrition in April 2018, at that time she exhibited weight loss and poor PO intakes. Pt has since lost even more weight, now 26 lb weight loss which is 23% wt loss x 5 months (significant for time frame). Per chart review, pt [facility]. Pt reports not liking the taste of food. [facility]  Will continue Ensure supplements and will order daily MVI.   Height: Ht Readings from Last 1 Encounters:  06/01/17 5\' 2"  (1.575 m)    Weight: Wt Readings from Last 1 Encounters:  06/01/17 87 lb (39.5 kg)    Weight Hx: Wt Readings from Last 10 Encounters:  06/01/17 87 lb (39.5 kg)  05/31/17 87 lb (39.5 kg)  05/07/17 98 lb (44.5 kg)  04/22/17 85 lb (38.6 kg)  01/05/17 113 lb (51.3 kg)  11/29/16 140 lb (63.5 kg)  01/08/16 144 lb (65.3 kg)  09/23/15 148 lb (67.1 kg)  11/16/14 157 lb (71.2 kg)  02/10/14 139 lb (63 kg)    BMI:  Body mass index is 15.91 kg/m. Pt meets criteria for underweight based on current BMI.  Estimated Nutritional Needs: Kcal: 25-30 kcal/kg Protein: > 1 gram protein/kg Fluid: 1 ml/kcal  Diet Order: Diet 2 gram sodium Room service appropriate? Yes; Fluid consistency: Thin Pt is also offered choice of unit snacks mid-morning and mid-afternoon.  Pt is eating as desired.   Lab results and medications reviewed.   Tilda FrancoLindsey Iridian Reader, MS, RD, LDN [facility] Clinical Dietitian Pager: [phone] After Hours Pager:  [phone]

## 2017-06-05 NOTE — Progress Notes (Signed)
Patient woke up from her afternoon nap, sitting in dayroom watching TV with peers.  Patient refused afternoon ensure.  Refused gatorade or ginger ale.  Staff will continue to push fluids and food.  Patient cooperative and pleasant.  Respirations even and unlabored.  No signs/symptoms of pain/distress noted on patient's face/body movements.  Safety maintained with 15 minute checks.

## 2017-06-06 MED ORDER — ARIPIPRAZOLE 10 MG PO TABS
10.0000 mg | ORAL_TABLET | Freq: Every day | ORAL | Status: DC
  Administered 2017-06-06 – 2017-06-20 (×15): 10 mg via ORAL
  Filled 2017-06-06 (×18): qty 1

## 2017-06-06 NOTE — Progress Notes (Signed)
D:  Patient denied SI and HI, contracts for safety.  Denied A/V hallucinations.  Denied pain.   A:  Patient did take her multivitamin this morning and abilify 10 mg after lunch.  Staff has repeatedly encouraged  patient to drink fluids and eat today.  Emotional support and encouragement given patient. R:  Safety maintained with 15 minute checks. Patient stated her problems began after her dad died.  That she lost her job and place to live.  Patient stated she needs to get disability check and help her pay living expenses.

## 2017-06-06 NOTE — Progress Notes (Signed)
D:  Yvonne [name] has been in the bed much of the evening.  She didn't attend evening wrap up group.  She denies SI/HI or A/V hallucinations.  She continues to voice depression but mainly due to she doesn't know how to get her life back.  She declined to take ensure and reported that she doesn't need any hs medications because "I take Abilify in the morning now."  Affect was flat.   A:  1:1 with RN for support and encouragement. Medication offered as ordered.  Q 15 minute checks maintained for safety.  Encouraged participation in group and unit activities.   R:  Yvonne [name] remains safe on the unit.  We will continue to monitor the progress towards her goals.

## 2017-06-06 NOTE — Progress Notes (Signed)
[facility]  06/06/2017 2:10 PM Yvonne BalsamMelvine [name]  MRN:  [id number] Subjective:   Yvonne [name] is a 51 y/o F with history of Bipolar I who was admitted on IVC petitioned by court liason with worsening symptoms of psychosis. As per IVC, pt had been non-adherent to prescribed medications, she was refusing medical treatment and refusing to eat while she was in jail. She had poor hygiene and lost an estimated 30lbs due to not eating. She had additional disorganized behaviors such as flushing linens down the toilet. Today upon evaluation, pt is calm and cooperative. She states that the only reason she was admitted is because she is homeless. She states that she was sleeping on the porch of her father's former residence, when authorities apprehended her. She does not have any specific concerns, and she denies having any mental illness. She acknowledges former diagnosis of bipolar disorder, but she rejects the diagnosis. Pt states that she does not need any medications such as zyprexa, but she is willing to take abilify. Pt is willing to have our SW team look into shelters and/or placement options. She had no further questions, comments, or concerns.  Principal Problem: Bipolar I disorder, current or most recent episode manic, with psychotic features (HCC) Diagnosis:   Patient Active Problem List   Diagnosis Date Noted  . Bipolar disorder, curr episode depressed, severe, w/psychotic features (HCC) [F31.5] 06/01/2017  . Bipolar I disorder, current or most recent episode manic, with psychotic features (HCC) [F31.2]   . Bipolar affective disorder, current episode mild (HCC) [F31.9] 01/05/2017  . Manic behavior (HCC) [F30.10]   . Facial cellulitis [L03.211] 02/10/2014   Total Time spent with patient: 30 minutes  Past Psychiatric History: see H&P  Past Medical History:  Past Medical History:  Diagnosis Date  . Bipolar 1 disorder (HCC)   . Medical history non-contributory   . Mental disorder   .  No pertinent past medical history     Past Surgical History:  Procedure Laterality Date  . NO PAST SURGERIES     Family History:  Family History  Problem Relation Age of Onset  . Depression Sister   . Depression Brother   . Depression Sister   . CAD Mother   . Hypertension Father   . Diabetes Father   . Bipolar disorder Cousin    Family Psychiatric  History: see H&P Social History:  Social History   Substance and Sexual Activity  Alcohol Use No     Social History   Substance and Sexual Activity  Drug Use No    Social History   Socioeconomic History  . Marital status: Single    Spouse name: None  . Number of children: None  . Years of education: None  . Highest education level: None  Social Needs  . Financial resource strain: None  . Food insecurity - worry: None  . Food insecurity - inability: None  . Transportation needs - medical: None  . Transportation needs - non-medical: None  Occupational History  . None  Tobacco Use  . Smoking status: Never Smoker  . Smokeless tobacco: Never Used  Substance and Sexual Activity  . Alcohol use: No  . Drug use: No  . Sexual activity: No  Other Topics Concern  . None  Social History Narrative  . None   Additional Social History:                         Sleep: Good  Appetite:  Good  Current Medications: Current Facility-Administered Medications  Medication Dose Route Frequency Provider Last Rate Last Dose  . acetaminophen (TYLENOL) tablet 650 mg  650 mg Oral Q6H PRN Rankin, Shuvon B, NP      . alum & mag hydroxide-simeth (MAALOX/MYLANTA) 200-200-20 MG/5ML suspension 30 mL  30 mL Oral Q4H PRN Rankin, Shuvon B, NP      . ARIPiprazole (ABILIFY) tablet 10 mg  10 mg Oral Daily Micheal Likens, MD   10 mg at 06/06/17 1300  . feeding supplement (ENSURE ENLIVE) (ENSURE ENLIVE) liquid 237 mL  237 mL Oral TID BM Rankin, Shuvon B, NP      . magnesium hydroxide (MILK OF MAGNESIA) suspension 30 mL  30 mL  Oral Daily PRN Rankin, Shuvon B, NP      . multivitamin with minerals tablet 1 tablet  1 tablet Oral Daily Cobos, Rockey Situ, MD   1 tablet at 06/06/17 1054    Lab Results: No results found for this or any previous visit (from the past 48 hour(s)).  Blood Alcohol level:  Lab Results  Component Value Date   ETH <10 05/11/2017   ETH <10 05/07/2017    Metabolic Disorder Labs: Lab Results  Component Value Date   HGBA1C 5.5 06/21/2012   MPG 111 06/21/2012   No results found for: PROLACTIN Lab Results  Component Value Date   CHOL 166 06/21/2012   TRIG 58 06/21/2012   HDL 58 06/21/2012   CHOLHDL 2.9 06/21/2012   VLDL 12 06/21/2012   LDLCALC 96 06/21/2012    Physical Findings: AIMS: Facial and Oral Movements Muscles of Facial Expression: None, normal Lips and Perioral Area: None, normal Jaw: None, normal Tongue: None, normal,Extremity Movements Upper (arms, wrists, hands, fingers): None, normal Lower (legs, knees, ankles, toes): None, normal, Trunk Movements Neck, shoulders, hips: None, normal, Overall Severity Severity of abnormal movements (highest score from questions above): None, normal Incapacitation due to abnormal movements: None, normal Patient's awareness of abnormal movements (rate only patient's report): No Awareness, Dental Status Current problems with teeth and/or dentures?: No Does patient usually wear dentures?: No  CIWA:  CIWA-Ar Total: 1 COWS:  COWS Total Score: 2  Musculoskeletal: Strength & Muscle Tone: within normal limits Gait & Station: normal Patient leans: N/A  Psychiatric Specialty Exam: Physical Exam  Nursing note and vitals reviewed.   Review of Systems  Constitutional: Negative for chills and fever.  Respiratory: Negative for cough.   Cardiovascular: Negative for chest pain.  Gastrointestinal: Negative for abdominal pain, heartburn, nausea and vomiting.  Psychiatric/Behavioral: Negative for depression, hallucinations and suicidal  ideas. The patient is not nervous/anxious.     Blood pressure (!) 103/54, pulse 96, temperature 99 F (37.2 C), temperature source Oral, resp. rate 18, height 5\' 2"  (1.575 m), weight 39.5 kg (87 lb).Body mass index is 15.91 kg/m.  General Appearance: Disheveled  Eye Contact:  Good  Speech:  Clear and Coherent and Normal Rate  Volume:  Normal  Mood:  Anxious  Affect:  Blunt, Congruent and Constricted  Thought Process:  Disorganized, Goal Directed and Descriptions of Associations: Loose  Orientation:  Full (Time, Place, and Person)  Thought Content:  Logical and Delusions  Suicidal Thoughts:  No  Homicidal Thoughts:  No  Memory:  Immediate;   Fair Recent;   Fair Remote;   Fair  Judgement:  Impaired  Insight:  Lacking  Psychomotor Activity:  Normal  Concentration:  Concentration: Good  Recall:  Good  Fund of Knowledge:  Good  Language:  Good  Akathisia:  No  Handed:    AIMS (if indicated):     Assets:  Communication Skills Resilience Social Support  ADL's:  Intact  Cognition:  WNL  Sleep:  Number of Hours: 6.75     Treatment Plan Summary: Daily contact with patient to assess and evaluate symptoms and progress in treatment and Medication management. Pt asserts that she has no mental illness and does not require medications; however, she was admitted with disorganized psychosis. She agrees to switch to abilify today. SW team will continue to investigate shelter options and outpatient follow up.  -Continue inpatient hospitalization - Bipolar I  - Discontinue olanzapine  - Start abilify 10mg  qDay - Encourage participation in groups and the therapeutic milieu - Discharge planning will be ongoing   Micheal Likenshristopher T Kayton Ripp, MD 06/06/2017, 2:10 PM

## 2017-06-06 NOTE — Plan of Care (Signed)
Nurse continues to talk to patient about her anxiety/depression/coping skills.

## 2017-06-06 NOTE — Progress Notes (Signed)
Recreation Therapy Notes  INPATIENT RECREATION THERAPY ASSESSMENT  Patient Details Name: Gypsy BalsamMelvine Nolting MRN: [id number] DOB: 04-24-1966 Today's Date: 06/06/2017  Patient Stressors: (Pt stated she wasn't stressed about anything.)  Pt stated she was here because she is homeless.  Coping Skills:   Exercise, Talking, Music, Sports  Personal Challenges: (Pt identified no personal challenges.)  Leisure Interests (2+):  Community - [employer], Individual - TV  Pt stated she likes to watch sports.  Awareness of Community Resources:  Yes  Community Resources:  Other (Comment)(Resource center)  Current Use: Yes  Patient Strengths:  staying positive; get the right information  Patient Identified Areas of Improvement:  Budgeting  Current Recreation Participation:  Once every week  Patient Goal for Hospitalization:  "Get out with benefits, get a place, live my life"  [place]  [place]  Current [place] (including self-harm):  No  Current HI:  No  Consent to Intern Participation: N/A    Caroll RancherMarjette Rayna Brenner, LRT/CTRS  Caroll RancherLindsay, Cortne Amara A 06/06/2017, 12:57 PM

## 2017-06-06 NOTE — Progress Notes (Signed)
Recreation Therapy Notes  Date: 06/06/17 Time: 1000 Location: 500 [address] Dayroom  Group Topic: Coping Skills  Goal Area(s) Addresses:  Patient will be able to identify positive coping skills. Patient will be able to identify what holds them back. Patient will be able to identify benefits of using coping skills post d/c.  Intervention: Web design, pencils  Activity: Web Design.  Patients were to identify the things they feel have them struck and prevents them from progressing and write them inside of the web.  Once those things were identified, patients were to come up with at least two coping skills for each setback and write them on the outside of the web.   Education: Coping Skills, Discharge Planning.   Education Outcome: Acknowledges understanding/In group clarification offered/Needs additional education.   Clinical Observations/Feedback: Pt did not attend group.   Yvonne [name], LRT/CTRS         Yvonne [name] 06/06/2017 11:59 AM 

## 2017-06-07 DIAGNOSIS — Z634 Disappearance and death of family member: Secondary | ICD-10-CM

## 2017-06-07 NOTE — Progress Notes (Signed)
Recreation Therapy Notes  Date: 06/07/17 Time: 1000 Location: 500 [address] Dayroom  Group Topic: Anger Management  Goal Area(s) Addresses:  Patient will identify triggers for anger.  Patient will identify physical reaction to anger.   Patient will identify benefit of using coping skills when angry.  Behavioral Response: None  Intervention: Worksheet, pencils  Activity:  Anger Thermometer.  Patients were given a worksheet with a thermometer on it.  Patients were to rank at least 6 things that get them angry from 1-10 (1being the lowest, 10 the highest).  Patients were to then identify coping skills they can use to combat their anger.   Education: Anger Management, Discharge Planning   Education Outcome: Acknowledges education/In group clarification offered/Needs additional education.   Clinical Observations/Feedback: Pt stated she didn't want to participate, she just wanted to listen.   Caroll RancherMarjette Nalina Yeatman, LRT/CTRS          Caroll RancherLindsay, Sativa Gelles A 06/07/2017 12:01 PM

## 2017-06-07 NOTE — [facility] Suicide Risk Assessment (Signed)
[facility] INPATIENT:  Family/Significant Other Suicide Prevention Education  Suicide Prevention Education:  Education Completed; Birdie RiddleHazel Nelson, sister, [phone]  has been identified by the patient as the family member/significant other with whom the patient will be residing, and identified as the person(s) who will aid the patient in the event of a mental health crisis (suicidal ideations/suicide attempt).  With written consent from the patient, the family member/significant other has been provided the following suicide prevention education, prior to the and/or following the discharge of the patient.  The suicide prevention education provided includes the following:  Suicide risk factors  Suicide prevention and interventions  National Suicide Hotline telephone number  [facility] assessment telephone number  [facility] Emergency Assistance 911  [facility] and/or Residential Mobile Crisis Unit telephone number  Request made of family/significant other to:  Remove weapons (e.g., guns, rifles, knives), all items previously/currently identified as safety concern.    Remove drugs/medications (over-the-counter, prescriptions, illicit drugs), all items previously/currently identified as a safety concern.  The family member/significant other verbalizes understanding of the suicide prevention education information provided.  The family member/significant other agrees to remove the items of safety concern listed above. Sister states she has an appointment at the Disability Office on Dec 6 @ 12:45PM, and she took papers to DSS to apply for MCD on 05/20/17.   Besides this, sister is unwilling to do anything else for sister, as are the rest of the family as pt has burned bridges with everyone due to her behavior.  Ida RogueRodney B Jhoel Stieg 06/07/2017, 12:57 PM

## 2017-06-07 NOTE — Progress Notes (Signed)
Nursing Note 06/07/2017 [id number]  Data Reports sleeping good.  Did not complete self-inventory. Affect anxious, blunted. Denies HI, SI, AVH.  Continues to endorse she plans on moving back to the boarding house she was kicked out of.  Pleasant, interacts well with staff and peers.  Remains safe on unit.   Action Spoke with patient 1:1, nurse offered support to patient throughout shift.  Continues to be monitored on 15 minute checks for safety.  Response Remains safe on unit.

## 2017-06-07 NOTE — Progress Notes (Signed)
Patient did not attend wrap up group. 

## 2017-06-07 NOTE — Progress Notes (Signed)
[facility]  06/07/2017 3:58 PM Yvonne BalsamMelvine [name]  MRN:  [id number]  Subjective: Yvonne [name] reports, "I was diagnosed with Bipolar disorder after my son died. Then my father recently died, I lost everything, became homeless. I'm currently a homeless Veteran trying to get my disability approved. I'm not suicidal, homicidal or hearing any voices. I'm just a homeless veteran, nothing more to it".   Objective: Yvonne BalsamMelvine Helle is a 51 y/o AA F with history of Bipolar I disorder who was admitted on IVC petitioned by court liason with worsening symptoms of psychosis. As per IVC, pt had been non-adherent to prescribed medications, she was refusing medical treatment and refusing to eat while she was in jail. She had poor hygiene and lost an estimated 30lbs due to not eating. She had additional disorganized behaviors such as flushing linens down the toilet. Today, 06-07-17, upon evaluation, pt presents calm and cooperative. She states that the only reason she was admitted is because she is homeless. She states that she was sleeping on the porch of her father's former residence, when authorities apprehended her. She does not have any specific concerns, and she denies having any mental illness. She acknowledges former diagnosis of bipolar disorder after the death of her son, but she rejects the diagnosis. Pt states that she does not need any medications such as zyprexa, but she is willing to take abilify. Pt is willing to have our SW team look into shelters and/or placement options. She had no further questions, comments, or concerns. She is hoping to get her disability application approved as does not have a job.  Principal Problem: Bipolar I disorder, current or most recent episode manic, with psychotic features (HCC) Diagnosis:   Patient Active Problem List   Diagnosis Date Noted  . Bipolar disorder, curr episode depressed, severe, w/psychotic features (HCC) [F31.5] 06/01/2017  . Bipolar I disorder, current or  most recent episode manic, with psychotic features (HCC) [F31.2]   . Bipolar affective disorder, current episode mild (HCC) [F31.9] 01/05/2017  . Manic behavior (HCC) [F30.10]   . Facial cellulitis [L03.211] 02/10/2014   Total Time spent with patient: 15 minutes  Past Psychiatric History: See H&P  Past Medical History:  Past Medical History:  Diagnosis Date  . Bipolar 1 disorder (HCC)   . Medical history non-contributory   . Mental disorder   . No pertinent past medical history     Past Surgical History:  Procedure Laterality Date  . NO PAST SURGERIES     Family History:  Family History  Problem Relation Age of Onset  . Depression Sister   . Depression Brother   . Depression Sister   . CAD Mother   . Hypertension Father   . Diabetes Father   . Bipolar disorder Cousin    Family Psychiatric  History: See H&P Social History:  Social History   Substance and Sexual Activity  Alcohol Use No     Social History   Substance and Sexual Activity  Drug Use No    Social History   Socioeconomic History  . Marital status: Single    Spouse name: None  . Number of children: None  . Years of education: None  . Highest education level: None  Social Needs  . Financial resource strain: None  . Food insecurity - worry: None  . Food insecurity - inability: None  . Transportation needs - medical: None  . Transportation needs - non-medical: None  Occupational History  . None  Tobacco Use  .  Smoking status: Never Smoker  . Smokeless tobacco: Never Used  Substance and Sexual Activity  . Alcohol use: No  . Drug use: No  . Sexual activity: No  Other Topics Concern  . None  Social History Narrative  . None   Additional Social History:   Sleep: Good  Appetite:  Good  Current Medications: Current Facility-Administered Medications  Medication Dose Route Frequency Provider Last Rate Last Dose  . acetaminophen (TYLENOL) tablet 650 mg  650 mg Oral Q6H PRN Rankin, Shuvon  B, NP      . alum & mag hydroxide-simeth (MAALOX/MYLANTA) 200-200-20 MG/5ML suspension 30 mL  30 mL Oral Q4H PRN Rankin, Shuvon B, NP      . ARIPiprazole (ABILIFY) tablet 10 mg  10 mg Oral Daily Micheal Likensain[place], [name], MD   10 mg at 06/07/17 0751  . feeding supplement (ENSURE ENLIVE) (ENSURE ENLIVE) liquid 237 mL  237 mL Oral TID BM Rankin, Shuvon B, NP      . magnesium hydroxide (MILK OF MAGNESIA) suspension 30 mL  30 mL Oral Daily PRN Rankin, Shuvon B, NP      . multivitamin with minerals tablet 1 tablet  1 tablet Oral Daily Cobos, [name], MD   1 tablet at 06/07/17 0750   Lab Results: No results found for this or any previous visit (from the past 48 hour(s)).  Blood Alcohol level:  Lab Results  Component Value Date   ETH <10 05/11/2017   ETH <10 05/07/2017   Metabolic Disorder Labs: Lab Results  Component Value Date   HGBA1C 5.5 06/21/2012   MPG 111 06/21/2012   No results found for: PROLACTIN Lab Results  Component Value Date   CHOL 166 06/21/2012   TRIG 58 06/21/2012   HDL 58 06/21/2012   CHOLHDL 2.9 06/21/2012   VLDL 12 06/21/2012   LDLCALC 96 06/21/2012   Physical Findings: AIMS: Facial and Oral Movements Muscles of Facial Expression: None, normal Lips and Perioral Area: None, normal Jaw: None, normal Tongue: None, normal,Extremity Movements Upper (arms, wrists, hands, fingers): None, normal Lower (legs, knees, ankles, toes): None, normal, Trunk Movements Neck, shoulders, hips: None, normal, Overall Severity Severity of abnormal movements (highest score from questions above): None, normal Incapacitation due to abnormal movements: None, normal Patient's awareness of abnormal movements (rate only patient's report): No Awareness, Dental Status Current problems with teeth and/or dentures?: No Does patient usually wear dentures?: No  CIWA:  CIWA-Ar Total: 1 COWS:  COWS Total Score: 2  Musculoskeletal: Strength & Muscle Tone: within normal limits Gait &  Station: normal Patient leans: N/A  Psychiatric Specialty Exam: Physical Exam  Nursing note and vitals reviewed.   Review of Systems  Constitutional: Negative for chills and fever.  Respiratory: Negative for cough.   Cardiovascular: Negative for chest pain.  Gastrointestinal: Negative for abdominal pain, heartburn, nausea and vomiting.  Psychiatric/Behavioral: Negative for depression, hallucinations and suicidal ideas. The patient is not nervous/anxious.     Blood pressure 98/65, pulse (!) 121, temperature 99.3 F (37.4 C), temperature source Oral, resp. rate 16, height 5\' 2"  (1.575 m), weight 39.5 kg (87 lb).Body mass index is 15.91 kg/m.  General Appearance: Disheveled  Eye Contact:  Good  Speech:  Clear and Coherent and Normal Rate  Volume:  Normal  Mood:  Anxious  Affect:  Blunt, Congruent and Constricted  Thought Process:  Disorganized, Goal Directed and Descriptions of Associations: Loose  Orientation:  Full (Time, Place, and Person)  Thought Content:  Logical and Delusions  Suicidal Thoughts:  No  Homicidal Thoughts:  No  Memory:  Immediate;   Fair Recent;   Fair Remote;   Fair  Judgement:  Impaired  Insight:  Lacking  Psychomotor Activity:  Normal  Concentration:  Concentration: Good  Recall:  Good  Fund of Knowledge:  Good  Language:  Good  Akathisia:  No  Handed:    AIMS (if indicated):     Assets:  Communication Skills Resilience Social Support  ADL's:  Intact  Cognition:  WNL  Sleep:  Number of Hours: 6.75   Treatment Plan Summary: Daily contact with patient to assess and evaluate symptoms and progress in treatment and Medication management. Pt asserts that she has no mental illness and does not require medications; however, she was admitted with disorganized psychosis. She agrees to switch to abilify today. She continues to say, I'm a homeless Veteran & nothing more. SW team will continue to investigate shelter options and outpatient follow  up.  -Continue inpatient hospitalization  Will continue today 06/07/2017 plan as below except where it is noted. - Bipolar I        - Continue abilify 10mg  qDay - Encourage participation in groups and the therapeutic milieu - Discharge planning will be ongoing  [name], NP, PMHNP, FNP-BC. 06/07/2017, 3:58 PMPatient ID: Yvonne [name], female   DOB: 1965-08-06, 51 y.o.   MRN: [id number]

## 2017-06-08 MED ORDER — TUBERCULIN PPD 5 UNIT/0.1ML ID SOLN
5.0000 [IU] | Freq: Once | INTRADERMAL | Status: AC
  Administered 2017-06-08: 5 [IU] via INTRADERMAL

## 2017-06-08 NOTE — Progress Notes (Signed)
Patient did not attend wrap up group. 

## 2017-06-08 NOTE — Progress Notes (Signed)
Patient ID: Yvonne BalsamMelvine [name], female   DOB: 17-Sep-1965, 51 y.o.   MRN: [id number]  DAR: Pt. Denies SI/HI and A/V Hallucinations. She does not report any pain or discomfort at this time. Support and encouragement provided to the patient however patient remains somewhat suspicious and guarded. She was willing to accept a PPD placement and tolerated well. She was weighed today and has had a 9 pound increase, MD [place] was notified. Patient does report that she is thankful that she has gained some weight. Her appearance still presents as poor with disheveled and matted hair. She continues to wear hospital supplied scrubs. She refuses Ensure but takes her other medications this morning as prescribed. She states, "I just want to get Ar[facility] Organoutta here as soon as possible." She does interact with her roommate intermittently. Her affect remains flat and her mood depressed. Q15 minute checks are maintained for safety.

## 2017-06-08 NOTE — [facility] Counselor (Signed)
[facility] called with request for updated information on pt.  FAXED

## 2017-06-08 NOTE — Progress Notes (Signed)
[facility] MD Progress Note  06/08/2017 12:16 PM Yvonne [name]  MRN:  [id number] Subjective:   Yvonne [name] is a 51 y/o F with history of Bipolar I who was admitted on IVC petitioned by the court liason due to worsening symptoms of psychosis. As per IVC, pt had been non-adherent to prescribed medications, she was refusing medical traetment, and she was not meeting her ADL's while in jail such as not eating food. She had poor hygiene and had lost an estimated 30lbs while in jail due to not eating. She also had disorganized behaviors of flushing linens down the toilet. Pt denies history of mental illness and she perseverates that she is only here because she is homeless, but she has been compliant with her prescribed medication of abilify and she has been eating well since admission. Her hygiene is still poor to fair. Today upon evaluation, pt reports she is doing well overall. She is sleeping well and her appetite is good. She denies SI/HI/AH/VH. She is tolerating her medications without difficulty or side effects. SW team has been in contact with pt's sister and found out that pt's sister had started application process for disability and medicAID, so pt is eligible for placement in a group home. Pt was in agreement to have SW team begin searching for eligible group homes. Pt agrees to continue her current regimen without changes. She had no furtehr questions, comments, or concerns.  Principal Problem: Bipolar I disorder, current or most recent episode manic, with psychotic features (HCC) Diagnosis:   Patient Active Problem List   Diagnosis Date Noted  . Bipolar disorder, curr episode depressed, severe, w/psychotic features (HCC) [F31.5] 06/01/2017  . Bipolar I disorder, current or most recent episode manic, with psychotic features (HCC) [F31.2]   . Bipolar affective disorder, current episode mild (HCC) [F31.9] 01/05/2017  . Manic behavior (HCC) [F30.10]   . Facial cellulitis [L03.211] 02/10/2014   Total  Time spent with patient: 30 minutes  Past Psychiatric History: see H&P  Past Medical History:  Past Medical History:  Diagnosis Date  . Bipolar 1 disorder (HCC)   . Medical history non-contributory   . Mental disorder   . No pertinent past medical history     Past Surgical History:  Procedure Laterality Date  . NO PAST SURGERIES     Family History:  Family History  Problem Relation Age of Onset  . Depression Sister   . Depression Brother   . Depression Sister   . CAD Mother   . Hypertension Father   . Diabetes Father   . Bipolar disorder Cousin    Family Psychiatric  History: see H&P Social History:  Social History   Substance and Sexual Activity  Alcohol Use No     Social History   Substance and Sexual Activity  Drug Use No    Social History   Socioeconomic History  . Marital status: Single    Spouse name: None  . Number of children: None  . Years of education: None  . Highest education level: None  Social Needs  . Financial resource strain: None  . Food insecurity - worry: None  . Food insecurity - inability: None  . Transportation needs - medical: None  . Transportation needs - non-medical: None  Occupational History  . None  Tobacco Use  . Smoking status: Never Smoker  . Smokeless tobacco: Never Used  Substance and Sexual Activity  . Alcohol use: No  . Drug use: No  . Sexual activity: No  Other Topics Concern  . None  Social History Narrative  . None   Additional Social History:                         Sleep: Good  Appetite:  Good  Current Medications: Current Facility-Administered Medications  Medication Dose Route Frequency Provider Last Rate Last Dose  . acetaminophen (TYLENOL) tablet 650 mg  650 mg Oral Q6H PRN Rankin, Shuvon B, NP      . alum & mag hydroxide-simeth (MAALOX/MYLANTA) 200-200-20 MG/5ML suspension 30 mL  30 mL Oral Q4H PRN Rankin, Shuvon B, NP      . ARIPiprazole (ABILIFY) tablet 10 mg  10 mg Oral Daily  Micheal Likensainville, Terran Klinke T, MD   10 mg at 06/08/17 0756  . feeding supplement (ENSURE ENLIVE) (ENSURE ENLIVE) liquid 237 mL  237 mL Oral TID BM Rankin, Shuvon B, NP      . magnesium hydroxide (MILK OF MAGNESIA) suspension 30 mL  30 mL Oral Daily PRN Rankin, Shuvon B, NP      . multivitamin with minerals tablet 1 tablet  1 tablet Oral Daily Cobos, [name], MD   1 tablet at 06/08/17 0756  . tuberculin injection 5 Units  5 Units Intradermal Once Micheal Likensainville, Sheffield Hawker T, MD   5 Units at 06/08/17 1154    Lab Results: No results found for this or any previous visit (from the past 48 hour(s)).  Blood Alcohol level:  Lab Results  Component Value Date   ETH <10 05/11/2017   ETH <10 05/07/2017    Metabolic Disorder Labs: Lab Results  Component Value Date   HGBA1C 5.5 06/21/2012   MPG 111 06/21/2012   No results found for: PROLACTIN Lab Results  Component Value Date   CHOL 166 06/21/2012   TRIG 58 06/21/2012   HDL 58 06/21/2012   CHOLHDL 2.9 06/21/2012   VLDL 12 06/21/2012   LDLCALC 96 06/21/2012    Physical Findings: AIMS: Facial and Oral Movements Muscles of Facial Expression: None, normal Lips and Perioral Area: None, normal Jaw: None, normal Tongue: None, normal,Extremity Movements Upper (arms, wrists, hands, fingers): None, normal Lower (legs, knees, ankles, toes): None, normal, Trunk Movements Neck, shoulders, hips: None, normal, Overall Severity Severity of abnormal movements (highest score from questions above): None, normal Incapacitation due to abnormal movements: None, normal Patient's awareness of abnormal movements (rate only patient's report): No Awareness, Dental Status Current problems with teeth and/or dentures?: No Does patient usually wear dentures?: No  CIWA:  CIWA-Ar Total: 1 COWS:  COWS Total Score: 2  Musculoskeletal: Strength & Muscle Tone: within normal limits Gait & Station: normal Patient leans: N/A  Psychiatric Specialty Exam: Physical Exam   Nursing note and vitals reviewed.   Review of Systems  Constitutional: Negative for chills and fever.  Respiratory: Negative for cough.   Cardiovascular: Negative for chest pain.  Gastrointestinal: Negative for heartburn and nausea.  Psychiatric/Behavioral: Negative for depression, hallucinations and suicidal ideas.    Blood pressure 103/64, pulse (!) 117, temperature 99.3 F (37.4 C), temperature source Oral, resp. rate 18, height 5\' 2"  (1.575 m), weight 39.5 kg (87 lb).Body mass index is 15.91 kg/m.  General Appearance: Disheveled  Eye Contact:  Good  Speech:  Clear and Coherent and Normal Rate  Volume:  Normal  Mood:  Euthymic  Affect:  Congruent and Constricted  Thought Process:  Coherent and Goal Directed  Orientation:  Full (Time, Place, and Person)  Thought Content:  Logical  Suicidal Thoughts:  No  Homicidal Thoughts:  No  Memory:  Immediate;   Good Recent;   Good Remote;   Good  Judgement:  Fair  Insight:  Fair  Psychomotor Activity:  Normal  Concentration:  Concentration: Good  Recall:  Good  Fund of Knowledge:  Good  Language:  Good  Akathisia:  No  Handed:    AIMS (if indicated):     Assets:  Communication Skills Leisure Time Physical Health Resilience Social Support  ADL's:  Intact  Cognition:  WNL  Sleep:  Number of Hours: 6.75     Treatment Plan Summary: Daily contact with patient to assess and evaluate symptoms and progress in treatment and Medication management. Pt has stability of her mood and psychotic symptoms, and she has been showing improvement in her appetite but she still has poor hygiene.  She is tolerating her medications without difficulty. She is in agreement to look into group home referrals.  - Continue inpatient hospitalization - Bipolar I        - Continue abilify 10mg  qDay - Encourage participation in groups and the therapeutic milieu - Discharge planning will be ongoing  Micheal Likenshristopher T Juanangel Soderholm, MD 06/08/2017, 12:16 PM

## 2017-06-08 NOTE — Tx Team (Signed)
Interdisciplinary Treatment and Diagnostic Plan Update  06/08/2017 Time of Session: 3:16 PM  Yvonne BalsamMelvine [name] MRN: [id number]  Principal Diagnosis: Bipolar I disorder, current or most recent episode manic, with psychotic features (HCC)  Secondary Diagnoses: Principal Problem:   Bipolar I disorder, current or most recent episode manic, with psychotic features (HCC) Active Problems:   Bipolar disorder, curr episode depressed, severe, w/psychotic features (HCC)   Current Medications:  Current Facility-Administered Medications  Medication Dose Route Frequency Provider Last Rate Last Dose  . acetaminophen (TYLENOL) tablet 650 mg  650 mg Oral Q6H PRN Rankin, Shuvon B, NP      . alum & mag hydroxide-simeth (MAALOX/MYLANTA) 200-200-20 MG/5ML suspension 30 mL  30 mL Oral Q4H PRN Rankin, Shuvon B, NP      . ARIPiprazole (ABILIFY) tablet 10 mg  10 mg Oral Daily Micheal Likensainville, [name], MD   10 mg at 06/08/17 0756  . feeding supplement (ENSURE ENLIVE) (ENSURE ENLIVE) liquid 237 mL  237 mL Oral TID BM Rankin, Shuvon B, NP      . magnesium hydroxide (MILK OF MAGNESIA) suspension 30 mL  30 mL Oral Daily PRN Rankin, Shuvon B, NP      . multivitamin with minerals tablet 1 tablet  1 tablet Oral Daily Cobos, [name], MD   1 tablet at 06/08/17 0756  . tuberculin injection 5 Units  5 Units Intradermal Once Micheal Likensainville, [name], MD   5 Units at 06/08/17 1154    PTA Medications: Medications Prior to Admission  Medication Sig Dispense Refill Last Dose  . ARIPiprazole (ABILIFY) 10 MG tablet Take 1 tablet (10 mg total) by mouth daily. 30 tablet 0 See note at ??  . carbamazepine (TEGRETOL XR) 200 MG 12 hr tablet Take 1 tablet (200 mg total) by mouth 2 (two) times daily. For mood stabilization 60 tablet 0 See note at ??  . hydrOXYzine (ATARAX/VISTARIL) 25 MG tablet Take 1 tablet (25 mg total) by mouth every 6 (six) hours as needed for anxiety. 60 tablet 0 See note at ??  . propranolol (INDERAL) 10 MG tablet  Take 1 tablet (10 mg total) by mouth 2 (two) times daily. For anxiety 60 tablet 0 See note at ??  . traZODone (DESYREL) 50 MG tablet Take 1 tablet (50 mg total) by mouth at bedtime as needed for sleep. 30 tablet 0 See note at ??    Patient Stressors: Financial difficulties Medication change or noncompliance Other: "I'm homeless"  Patient Strengths: Average or above average intelligence Communication skills General fund of knowledge  Treatment Modalities: Medication Management, Group therapy, Case management,  1 to 1 session with clinician, Psychoeducation, Recreational therapy.   Physician Treatment Plan for Primary Diagnosis: Bipolar I disorder, current or most recent episode manic, with psychotic features (HCC) Long Term Goal(s): Improvement in symptoms so as ready for discharge  Short Term Goals: Ability to identify changes in lifestyle to reduce recurrence of condition will improve Ability to verbalize feelings will improve Ability to disclose and discuss suicidal ideas Ability to demonstrate self-control will improve Ability to identify and develop effective coping behaviors will improve Ability to maintain clinical measurements within normal limits will improve Compliance with prescribed medications will improve Ability to identify triggers associated with substance abuse/mental health issues will improve Ability to identify changes in lifestyle to reduce recurrence of condition will improve Ability to verbalize feelings will improve Ability to disclose and discuss suicidal ideas Ability to demonstrate self-control will improve Ability to identify and develop effective coping  behaviors will improve Ability to maintain clinical measurements within normal limits will improve Compliance with prescribed medications will improve Ability to identify triggers associated with substance abuse/mental health issues will improve  Medication Management: Evaluate patient's response, side  effects, and tolerance of medication regimen.  Therapeutic Interventions: 1 to 1 sessions, Unit Group sessions and Medication administration.  Evaluation of Outcomes: Progressing  Physician Treatment Plan for Secondary Diagnosis: Principal Problem:   Bipolar I disorder, current or most recent episode manic, with psychotic features (HCC) Active Problems:   Bipolar disorder, curr episode depressed, severe, w/psychotic features (HCC)   Long Term Goal(s): Improvement in symptoms so as ready for discharge  Short Term Goals: Ability to identify changes in lifestyle to reduce recurrence of condition will improve Ability to verbalize feelings will improve Ability to disclose and discuss suicidal ideas Ability to demonstrate self-control will improve Ability to identify and develop effective coping behaviors will improve Ability to maintain clinical measurements within normal limits will improve Compliance with prescribed medications will improve Ability to identify triggers associated with substance abuse/mental health issues will improve Ability to identify changes in lifestyle to reduce recurrence of condition will improve Ability to verbalize feelings will improve Ability to disclose and discuss suicidal ideas Ability to demonstrate self-control will improve Ability to identify and develop effective coping behaviors will improve Ability to maintain clinical measurements within normal limits will improve Compliance with prescribed medications will improve Ability to identify triggers associated with substance abuse/mental health issues will improve  Medication Management: Evaluate patient's response, side effects, and tolerance of medication regimen.  Therapeutic Interventions: 1 to 1 sessions, Unit Group sessions and Medication administration.  Evaluation of Outcomes: Progressing   RN Treatment Plan for Primary Diagnosis: Bipolar I disorder, current or most recent episode manic, with  psychotic features (HCC) Long Term Goal(s): Knowledge of disease and therapeutic regimen to maintain health will improve  Short Term Goals: Compliance with prescribed medications will improve  Medication Management: RN will administer medications as ordered by provider, will assess and evaluate patient's response and provide education to patient for prescribed medication. RN will report any adverse and/or side effects to prescribing provider.  Therapeutic Interventions: 1 on 1 counseling sessions, Psychoeducation, Medication administration, Evaluate responses to treatment, Monitor vital signs and CBGs as ordered, Perform/monitor CIWA, COWS, AIMS and Fall Risk screenings as ordered, Perform wound care treatments as ordered.  Evaluation of Outcomes: Progressing   11/28:  Pt has stability of her mood and psychotic symptoms, and she has been showing improvement in her appetite but she still has poor hygiene.  She is tolerating her medications without difficulty. She is in agreement to look into group home referrals. She was refusing meds as recently as 4 days ago, but once second opinion was rendered and she was offered injection, she chose PO.  - Continue inpatient hospitalization - Bipolar I - Continueabilify 10mg  qDay     LCSW Treatment Plan for Primary Diagnosis: Bipolar I disorder, current or most recent episode manic, with psychotic features (HCC) Long Term Goal(s): Safe transition to appropriate next level of care at discharge, Engage patient in therapeutic group addressing interpersonal concerns.  Short Term Goals: Engage patient in aftercare planning with referrals and resources, Increase social support and Facilitate acceptance of mental health diagnosis and concerns  Therapeutic Interventions: Assess for all discharge needs, 1 to 1 time with Social worker, Explore available resources and support systems, Assess for adequacy in community support network, Educate family and  significant other(s) on suicide  prevention, Complete Psychosocial Assessment, Interpersonal group therapy.  Evaluation of Outcomes: Progressing   Progress in Treatment: Attending groups: Yes Participating in groups: No Taking medication as prescribed: Yes Toleration medication: Yes, no side effects reported at this time Family/Significant other contact made: Not yet Patient understands diagnosis: No, Limited insight Discussing patient identified problems/goals with staff: Yes Medical problems stabilized or resolved: Yes Denies suicidal/homicidal ideation: Yes Issues/concerns per patient self-inventory: None Other: N/A  New problem(s) identified: None identified at this time.   New Short Term/Long Term Goal(s): None identified at this time.   Discharge Plan or Barriers:CSW looking for placement with [facility] supplying LOG [possibly]  Back up plan is shelter.  Reason for Continuation of Hospitalization: Anxiety Depression Mania Medication Stabilization  Estimated Length of Stay: 12/6  Attendees: Patient: Physician: Jolyne Loahristopher Rainville, MD        06/08/2017  3:16 PM Nursing:  Elby Beckoni Waller        06/08/2017  3:16 PM RN Care Manager:     06/08/2017  3:16 PM Social Worker: Richelle Itood Judyann Casasola           06/08/2017  3:16 PM Recreational Therapist:        06/08/2017  3:16 PM Other:      06/08/2017  3:16 PM Other:   06/08/2017  3:16 PM   Scribe for Treatment Team: Richelle Itood Owyn Raulston LCSW 06/08/2017 3:16 PM

## 2017-06-08 NOTE — Progress Notes (Signed)
Recreation Therapy Notes  Date: 06/08/17 Time: 1000 Location: 500 Hall Dayroom  Group Topic: Communication, Team Building, Problem Solving  Goal Area(s) Addresses:  Patient will effectively work with peer towards shared goal.  Patient will identify skill used to make activity successful.  Patient will identify how skills used during activity can be used to reach post d/c goals.   Behavioral Response: None  Intervention: STEM Activity   Activity: [employer] Tower. In teams, patients were asked to build the tallest freestanding tower possible out of 15 pipe cleaners. Systematically resources were removed, for example patient ability to use both hands and patient ability to verbally communicate.    Education: Pharmacist, communityocial Skills, Building control surveyorDischarge Planning.   Education Outcome: Acknowledges education/In group clarification offered/Needs additional education.   Clinical Observations/Feedback: Pt sat, listened and observed but did not participate.     Caroll RancherMarjette Yizel Canby, LRT/CTRS         Caroll RancherLindsay, Raiza Kiesel A 06/08/2017 12:35 PM

## 2017-06-08 NOTE — Progress Notes (Signed)
D: Pt was laying in bed prior to the assessment. Informed the writer that she "didn't want to go to group". Writer asked pt if she wanted to step into the hall for privacy. Pt stated, "for what, I'm just homeless. I hit rock bottom since my daddy died". Writer gave condolences. According to the pt her father died June of 2017. Pt stated, "I'm just trying to get a place to stay".  Pt has no questions or concerns.    A:  Support and encouragement was offered. 15 min checks continued for safety.  R: Pt remains safe.

## 2017-06-09 NOTE — Progress Notes (Signed)
Patient ID: Yvonne BalsamMelvine Blaney, female   DOB: Jan 23, 1966, 51 y.o.   MRN: [id number]  Writer made MD Rainville aware of patient's elevated pulse this morning during treatment team at 0830.

## 2017-06-09 NOTE — Progress Notes (Signed)
Patient did not attend wrap up group. 

## 2017-06-09 NOTE — Progress Notes (Signed)
Patient ID: Yvonne BalsamMelvine [name], female   DOB: 08/18/1965, 51 y.o.   MRN: [id number]  DAR: Pt. Denies SI/HI and A/V Hallucinations. Patient does not report any pain or discomfort at this time. Support and encouragement provided to the patient. Scheduled medications except Ensure nutritional supplement was administered as patient refused Ensure. Patient remains guarded and isolated to her room throughout most of the day. She is seen in the dayroom from time to time but does not appear to be interacting with anyone except her roommate. She presents with flat affect and depressed mood. She does not brighten during interaction. Q15 minute checks are maintained for safety.

## 2017-06-09 NOTE — Progress Notes (Signed)
Patient sleeping soundly at this time. Safety checks maintained. Will continue to monitor patient.

## 2017-06-09 NOTE — Progress Notes (Signed)
Patient ID: Yvonne [name], female   DOB: [birth year], 51 y.o.   MRN: [id number]  Pt currently presents with a flat affect and depressed, isolative behavior. Pt forwards little to writer, remains in the room all night tonight. During interaction with pt, pt presents as irritable. Pt states "I'm just trying to go to sleep." Pt reports good sleep without any current medication. Pt refused alternative nutritional support tonight.   Pt's labs and vitals were monitored throughout the night. Pt given a 1:1 about emotional and mental status. Pt supported and encouraged to express concerns and questions. Educated on the importance of nutritional and fluid intake.   Pt's safety ensured with 15 minute and environmental checks. Pt currently denies SI/HI and A/V hallucinations. Pt verbally agrees to seek staff if SI/HI or A/VH occurs and to consult with staff before acting on any harmful thoughts. Staff will encourage nutritional intake tomorrow during breakfast. Will continue POC.

## 2017-06-09 NOTE — Progress Notes (Signed)
[facility]  06/09/2017 4:23 PM Yvonne [name]  MRN:  [id number]  Subjective: Angle reports, "I'm okay, just scared because I still don't know where I'm going after I'm discharged from this place. I'm hoping to still stay some where [place] because my family is here. I have lost everything that I owned, I just do not know how".  Objective: Yvonne [name] is a 51 y/o AA F with history of Bipolar I who was admitted on IVC petitioned by the court liason due to worsening symptoms of psychosis. As per IVC, pt had been non-adherent to prescribed medications, she was refusing medical traetment, and she was not meeting her ADL's while in jail such as not eating food. She had poor hygiene and had lost an estimated 30lbs while in jail due to not eating. She also had disorganized behaviors of flushing linens down the toilet. Pt denies history of mental illness and she perseverates that she is only here because she is homeless, but she has been compliant with her prescribed medication of abilify and she has been eating well since admission. Her hygiene is still poor to fair. Today, 06-09-17, upon evaluation, pt reports she is doing okay, only scared because she still does not know where she is going after discharge. She is sleeping well and her appetite is good. She denies SI/HI/AH/VH. She is tolerating her medications without difficulty or side effects. SW team has been in contact with pt's sister and found out that pt's sister had started application process for disability and medicAID, so pt is eligible for placement in a group home. Pt was in agreement to have SW team begin searching for eligible group homes. Pt agrees to continue her current regimen without changes. She had no furtehr questions, comments, or concerns.  Principal Problem: Bipolar I disorder, current or most recent episode manic, with psychotic features (HCC)  Diagnosis:   Patient Active Problem List   Diagnosis Date Noted   . Bipolar disorder, curr episode depressed, severe, w/psychotic features (HCC) [F31.5] 06/01/2017  . Bipolar I disorder, current or most recent episode manic, with psychotic features (HCC) [F31.2]   . Bipolar affective disorder, current episode mild (HCC) [F31.9] 01/05/2017  . Manic behavior (HCC) [F30.10]   . Facial cellulitis [L03.211] 02/10/2014   Total Time spent with patient: 15 minutes  Past Psychiatric History: see H&P  Past Medical History:  Past Medical History:  Diagnosis Date  . Bipolar 1 disorder (HCC)   . Medical history non-contributory   . Mental disorder   . No pertinent past medical history     Past Surgical History:  Procedure Laterality Date  . NO PAST SURGERIES     Family History:  Family History  Problem Relation Age of Onset  . Depression Sister   . Depression Brother   . Depression Sister   . CAD Mother   . Hypertension Father   . Diabetes Father   . Bipolar disorder Cousin    Family Psychiatric  History: see H&P  Social History:  Social History   Substance and Sexual Activity  Alcohol Use No     Social History   Substance and Sexual Activity  Drug Use No    Social History   Socioeconomic History  . Marital status: Single    Spouse name: None  . Number of children: None  . Years of education: None  . Highest education level: None  Social Needs  . Financial resource strain: None  . Food insecurity -  worry: None  . Food insecurity - inability: None  . Transportation needs - medical: None  . Transportation needs - non-medical: None  Occupational History  . None  Tobacco Use  . Smoking status: Never Smoker  . Smokeless tobacco: Never Used  Substance and Sexual Activity  . Alcohol use: No  . Drug use: No  . Sexual activity: No  Other Topics Concern  . None  Social History Narrative  . None   Additional Social History:   Sleep: Good  Appetite:  Good  Current Medications: Current Facility-Administered Medications   Medication Dose Route Frequency Provider Last Rate Last Dose  . acetaminophen (TYLENOL) tablet 650 mg  650 mg Oral Q6H PRN Rankin, Shuvon B, NP      . alum & mag hydroxide-simeth (MAALOX/MYLANTA) 200-200-20 MG/5ML suspension 30 mL  30 mL Oral Q4H PRN Rankin, Shuvon B, NP      . ARIPiprazole (ABILIFY) tablet 10 mg  10 mg Oral Daily Micheal Likens, MD   10 mg at 06/09/17 0754  . feeding supplement (ENSURE ENLIVE) (ENSURE ENLIVE) liquid 237 mL  237 mL Oral TID BM Rankin, Shuvon B, NP      . magnesium hydroxide (MILK OF MAGNESIA) suspension 30 mL  30 mL Oral Daily PRN Rankin, Shuvon B, NP      . multivitamin with minerals tablet 1 tablet  1 tablet Oral Daily Cobos, Rockey Situ, MD   1 tablet at 06/09/17 0754  . tuberculin injection 5 Units  5 Units Intradermal Once Micheal Likens, MD   5 Units at 06/08/17 1154    Lab Results: No results found for this or any previous visit (from the past 48 hour(s)).  Blood Alcohol level:  Lab Results  Component Value Date   ETH <10 05/11/2017   ETH <10 05/07/2017    Metabolic Disorder Labs: Lab Results  Component Value Date   HGBA1C 5.5 06/21/2012   MPG 111 06/21/2012   No results found for: PROLACTIN Lab Results  Component Value Date   CHOL 166 06/21/2012   TRIG 58 06/21/2012   HDL 58 06/21/2012   CHOLHDL 2.9 06/21/2012   VLDL 12 06/21/2012   LDLCALC 96 06/21/2012    Physical Findings: AIMS: Facial and Oral Movements Muscles of Facial Expression: None, normal Lips and Perioral Area: None, normal Jaw: None, normal Tongue: None, normal,Extremity Movements Upper (arms, wrists, hands, fingers): None, normal Lower (legs, knees, ankles, toes): None, normal, Trunk Movements Neck, shoulders, hips: None, normal, Overall Severity Severity of abnormal movements (highest score from questions above): None, normal Incapacitation due to abnormal movements: None, normal Patient's awareness of abnormal movements (rate only patient's  report): No Awareness, Dental Status Current problems with teeth and/or dentures?: No Does patient usually wear dentures?: No  CIWA:  CIWA-Ar Total: 1 COWS:  COWS Total Score: 2  Musculoskeletal: Strength & Muscle Tone: within normal limits Gait & Station: normal Patient leans: N/A  Psychiatric Specialty Exam: Physical Exam  Nursing note and vitals reviewed.   Review of Systems  Constitutional: Negative for chills and fever.  Respiratory: Negative for cough.   Cardiovascular: Negative for chest pain.  Gastrointestinal: Negative for heartburn and nausea.  Psychiatric/Behavioral: Negative for depression, hallucinations and suicidal ideas.    Blood pressure 98/64, pulse (!) 120, temperature 98.5 F (36.9 C), temperature source Oral, resp. rate 16, height 5\' 2"  (1.575 m), weight 43.5 kg (96 lb).Body mass index is 17.56 kg/m.  General Appearance: Disheveled  Eye Contact:  Good  Speech:  Clear and Coherent and Normal Rate  Volume:  Normal  Mood:  Euthymic  Affect:  Congruent and Constricted  Thought Process:  Coherent and Goal Directed  Orientation:  Full (Time, Place, and Person)  Thought Content:  Logical  Suicidal Thoughts:  No  Homicidal Thoughts:  No  Memory:  Immediate;   Good Recent;   Good Remote;   Good  Judgement:  Fair  Insight:  Fair  Psychomotor Activity:  Normal  Concentration:  Concentration: Good  Recall:  Good  Fund of Knowledge:  Good  Language:  Good  Akathisia:  No  Handed:    AIMS (if indicated):     Assets:  Communication Skills Leisure Time Physical Health Resilience Social Support  ADL's:  Intact  Cognition:  WNL  Sleep:  Number of Hours: 6.75   Treatment Plan Summary: Daily contact with patient to assess and evaluate symptoms and progress in treatment and Medication management. Pt has stability of her mood and psychotic symptoms, and she has been showing improvement in her appetite but she still has poor hygiene.  She is tolerating her  medications without difficulty. She is in agreement to look into group home referrals.  - Continue inpatient hospitalization.  Will continue today 06/09/2017 plan as below except where it is noted.  - Bipolar I        - Continue abilify 10mg  qDay - Encourage participation in groups and the therapeutic milieu - Discharge planning will be ongoing  Sanjuana KavaNwoko, Agnes I, NP, PMHNP, FNP-BC 06/09/2017, 4:23 PMPatient ID: Yvonne BalsamMelvine Postema, female   DOB: 06-13-1966, 51 y.o.   MRN: [id number]

## 2017-06-09 NOTE — Progress Notes (Signed)
Recreation Therapy Notes  Date: 06/09/17 Time: 1000 Location: 500 Hall Dayroom  Group Topic: Wellness  Goal Area(s) Addresses:  Patient will define components of whole wellness. Patient will verbalize benefit of whole wellness.  Behavioral Response: None  Intervention:  Social workerubber Disc  Activity: [place].  Each patient was given [name] rubber disc, with one extra disc for the group.  Patients were to use the discs to step on to get from one end of the hall to the other and back.  If anyone stepped off their disc, the group would have to start over from the beginning.   Education: Wellness, Building control surveyorDischarge Planning.   Education Outcome: Acknowledges education/In group clarification offered/Needs additional education.   Clinical Observations/Feedback: Pt did not participate in group.  Pt observed and listened during group.   Yvonne [name], Yvonne [name]         Yvonne [name], Yvonne [name] 06/09/2017 12:16 PM

## 2017-06-10 NOTE — [facility] Group Notes (Signed)
Attended spiritual care group facilitated by Wilkie Ayehaplain Issaac Shipper, MDiv   Group focused on topic of "hope"  Patients identified how they defined hope in their lives and engaged in facilitated conversation around topic.  Participated in visual explorer activity, finding images that resonated with their concept of hope for today.     Yvonne [name] was present throughout group.  Noted that hope was difficult for her to hold, as she was feeling bewildered.  Stated "I never expected to be homeless."    Noted her hope rests in her faith and she leans on her older sister.  Chose a photo of a flower blooming out of a rock and stated "I feel like this flower - where I only have one finger up and everything is piled on top of me, but I know God will reach down and lift me up"    [facility] Chaplain Yvonne KingfisherMatthew Treyvon [name], MDiv

## 2017-06-10 NOTE — Progress Notes (Signed)
Did not attend group 

## 2017-06-10 NOTE — Progress Notes (Signed)
[facility]  06/10/2017 12:42 PM Yvonne BalsamMelvine Puopolo  MRN:  [id number] Subjective:    Yvonne [name] is a 51 y/o F with history of Bipolar I who was admitted on IVC petitioned by the court liason due to worsening symptoms of psychosis. As per IVC, pt had been non-adherent to prescribed medications, she was refusing medical treatment/not meeting her ADL's while in jail. Pt had not been eating food and had lost about 30 lbs while in jail. She also had poor hygiene. She had disorganized behaviors such as flushing linens down the toilet. Pt was restarted on abilify and she reported that she has been doing well as SW team searches for eligible group homes. Today upon evaluation, she reports that she is doing well overall. She has gained about 9 lbs since admission and she reports that she is trying to gain weight, so she is happy about that news. She denies SI/HI/AH/VH. She has some anxiety about the uncertainty of her next housing location, and she explains, "I'm just waiting to see where I'm going." She is tolerating her medications without difficulty or side effects. She would like to continue her current regimen without changes. She will continue to work with SW team to look for eligible group home. She had no further questions, comments, or concerns.   Principal Problem: Bipolar I disorder, current or most recent episode manic, with psychotic features (HCC) Diagnosis:   Patient Active Problem List   Diagnosis Date Noted  . Bipolar disorder, curr episode depressed, severe, w/psychotic features (HCC) [F31.5] 06/01/2017  . Bipolar I disorder, current or most recent episode manic, with psychotic features (HCC) [F31.2]   . Bipolar affective disorder, current episode mild (HCC) [F31.9] 01/05/2017  . Manic behavior (HCC) [F30.10]   . Facial cellulitis [L03.211] 02/10/2014   Total Time spent with patient: 30 minutes  Past Psychiatric History: see H&P  Past Medical History:  Past Medical History:   Diagnosis Date  . Bipolar 1 disorder (HCC)   . Medical history non-contributory   . Mental disorder   . No pertinent past medical history     Past Surgical History:  Procedure Laterality Date  . NO PAST SURGERIES     Family History:  Family History  Problem Relation Age of Onset  . Depression Sister   . Depression Brother   . Depression Sister   . CAD Mother   . Hypertension Father   . Diabetes Father   . Bipolar disorder Cousin    Family Psychiatric  History: see H&P Social History:  Social History   Substance and Sexual Activity  Alcohol Use No     Social History   Substance and Sexual Activity  Drug Use No    Social History   Socioeconomic History  . Marital status: Single    Spouse name: None  . Number of children: None  . Years of education: None  . Highest education level: None  Social Needs  . Financial resource strain: None  . Food insecurity - worry: None  . Food insecurity - inability: None  . Transportation needs - medical: None  . Transportation needs - non-medical: None  Occupational History  . None  Tobacco Use  . Smoking status: Never Smoker  . Smokeless tobacco: Never Used  Substance and Sexual Activity  . Alcohol use: No  . Drug use: No  . Sexual activity: No  Other Topics Concern  . None  Social History Narrative  . None   Additional Social History:  Sleep: Good  Appetite:  Good  Current Medications: Current Facility-Administered Medications  Medication Dose Route Frequency Provider Last Rate Last Dose  . acetaminophen (TYLENOL) tablet 650 mg  650 mg Oral Q6H PRN Rankin, Shuvon B, NP      . alum & mag hydroxide-simeth (MAALOX/MYLANTA) 200-200-20 MG/5ML suspension 30 mL  30 mL Oral Q4H PRN Rankin, Shuvon B, NP      . ARIPiprazole (ABILIFY) tablet 10 mg  10 mg Oral Daily Micheal Likens, MD   10 mg at 06/10/17 0759  . feeding supplement (ENSURE ENLIVE) (ENSURE ENLIVE) liquid 237 mL   237 mL Oral TID BM Rankin, Shuvon B, NP      . magnesium hydroxide (MILK OF MAGNESIA) suspension 30 mL  30 mL Oral Daily PRN Rankin, Shuvon B, NP      . multivitamin with minerals tablet 1 tablet  1 tablet Oral Daily Cobos, Rockey Situ, MD   1 tablet at 06/10/17 0759    Lab Results: No results found for this or any previous visit (from the past 48 hour(s)).  Blood Alcohol level:  Lab Results  Component Value Date   ETH <10 05/11/2017   ETH <10 05/07/2017    Metabolic Disorder Labs: Lab Results  Component Value Date   HGBA1C 5.5 06/21/2012   MPG 111 06/21/2012   No results found for: PROLACTIN Lab Results  Component Value Date   CHOL 166 06/21/2012   TRIG 58 06/21/2012   HDL 58 06/21/2012   CHOLHDL 2.9 06/21/2012   VLDL 12 06/21/2012   LDLCALC 96 06/21/2012    Physical Findings: AIMS: Facial and Oral Movements Muscles of Facial Expression: None, normal Lips and Perioral Area: None, normal Jaw: None, normal Tongue: None, normal,Extremity Movements Upper (arms, wrists, hands, fingers): None, normal Lower (legs, knees, ankles, toes): None, normal, Trunk Movements Neck, shoulders, hips: None, normal, Overall Severity Severity of abnormal movements (highest score from questions above): None, normal Incapacitation due to abnormal movements: None, normal Patient's awareness of abnormal movements (rate only patient's report): No Awareness, Dental Status Current problems with teeth and/or dentures?: No Does patient usually wear dentures?: No  CIWA:  CIWA-Ar Total: 1 COWS:  COWS Total Score: 2  Musculoskeletal: Strength & Muscle Tone: within normal limits Gait & Station: normal Patient leans: N/A  Psychiatric Specialty Exam: Physical Exam  Nursing note and vitals reviewed.   Review of Systems  Constitutional: Negative for chills and fever.  Respiratory: Negative for cough and shortness of breath.   Cardiovascular: Negative for chest pain.  Gastrointestinal: Negative  for abdominal pain, heartburn, nausea and vomiting.  Psychiatric/Behavioral: Negative for depression, hallucinations and suicidal ideas. The patient is not nervous/anxious.     Blood pressure 97/69, pulse (!) 114, temperature 98.7 F (37.1 C), temperature source Oral, resp. rate 18, height 5\' 2"  (1.575 m), weight 43.5 kg (96 lb).Body mass index is 17.56 kg/m.  General Appearance: Casual and Disheveled  Eye Contact:  Good  Speech:  Clear and Coherent and Normal Rate  Volume:  Normal  Mood:  Anxious  Affect:  Appropriate, Congruent and Constricted  Thought Process:  Coherent and Goal Directed  Orientation:  Full (Time, Place, and Person)  Thought Content:  Logical  Suicidal Thoughts:  No  Homicidal Thoughts:  No  Memory:  Immediate;   Good Recent;   Good Remote;   Good  Judgement:  Fair  Insight:  Fair  Psychomotor Activity:  Normal  Concentration:  Concentration: Good  Recall:  Good  Fund of Knowledge:  Good  Language:  Good  Akathisia:  No  Handed:    AIMS (if indicated):     Assets:  Communication Skills Desire for Improvement Leisure Time Physical Health Resilience Social Support  ADL's:  Intact  Cognition:  WNL  Sleep:  Number of Hours: 6.75     Treatment Plan Summary: Daily contact with patient to assess and evaluate symptoms and progress in treatment and Medication management. Pt reports stability of her mood symptoms and she continues to focus on placement in a group home. She is tolerating her medications well and her only complaint is anxiety about waiting to find out where she will be placed. She agrees to continue her current regimen without changes.  - Continue inpatient hospitalization.  - Bipolar I - Continueabilify 10mg  qDay  - RN staff to read PPD (results to be reported to possible group home placement options)  - Encourage participation in groups and the therapeutic milieu - Discharge planning will be ongoing   Micheal Likenshristopher T  Jozee Hammer, MD 06/10/2017, 12:42 PM

## 2017-06-10 NOTE — Progress Notes (Signed)
Recreation Therapy Notes  Date: 06/10/17 Time: 1000 Location: 500 Hall Dayroom  Group Topic: Leisure Education  Goal Area(s) Addresses:  Patient will identify positive leisure activities.  Patient will identify one positive benefit of participation in leisure activities.   Behavioral Response: None  Intervention: AT[employer]TWhite board, dry erase marker, eraser, various words  Activity:  Pictionary.  LRT divided the group into two teams.  Each team was given one minute to draw/guess their picture.  If they did not guess the correct answer, the other team got a chance to still the point.    Education:  Leisure Education, Building control surveyorDischarge Planning  Education Outcome: Acknowledges education/In group clarification offered/Needs additional education  Clinical Observations/Feedback: Pt did not participate.  Pt was quiet, flat and observed the group session.    Caroll RancherMarjette Pleasant Bensinger, LRT/CTRS         Caroll RancherLindsay, Errika Narvaiz A 06/10/2017 11:39 AM

## 2017-06-10 NOTE — Progress Notes (Addendum)
D: Pt visible in milieu at intervals during shift. A & O to self and place. Denies SI,HI, AVH and pain. Presents with depressed affect and mood but remains pleasant and cooperative with care. Worried about current situation with housing and financial difficulties "do you think it will get better Alvena Kiernan, do you think I will find somewhere to live? Attended unit groups as scheduled. Takes her medications without issues.  A: Encouragement offered towards treatment compliance and to exercise patience with CSW in finding suitable placement for her. All medications given as per MD's orders with verbal education and effects monitored.PPD read earlier this shift 0 mm of induration noted at site (R forearm). Emotional support and availability offered to pt. Q 15 minutes safety checks continues without self harm gestures or outburst thus far.   R: Pt receptive to care. Denies adverse drug reactions or concerns this shift. Tolerates all PO intake well. Refused ensure when offered this shift. Off unit for meals with peers, returned without issues.

## 2017-06-11 NOTE — [facility] Group Notes (Signed)
  [facility] LCSW Group Therapy Note  Date/Time:  06/11/2017 11:15AM-12:00PM  Type of Therapy and Topic:  Group Therapy:  Feelings About Hospitalization  Participation Level:  Minimal   Description of Group This process group involved patients discussing their feelings related to being hospitalized, as well as the benefits they see to being in the hospital.  These feelings and benefits were itemized.  The group then brainstormed specific ways in which they could seek those same benefits when they discharge and return home.  Therapeutic Goals 1. Patient will identify and describe positive and negative feelings related to hospitalization 2. Patient will verbalize benefits of hospitalization to themselves personally 3. Patients will brainstorm together ways they can obtain similar benefits in the outpatient setting, identify barriers to wellness and possible solutions  Summary of Patient Progress:  The patient expressed her primary feelings about being hospitalized are "I don't like being here" then referenced her father's death in June that had rendered her homeless.  She stated she is trusting her weekday social worker to find her a place to go.  She did not talk further during group.  Therapeutic Modalities Cognitive Behavioral Therapy Motivational Interviewing    Ambrose MantleMareida Grossman-Orr, LCSW 06/11/2017, 12:26 PM

## 2017-06-11 NOTE — Progress Notes (Signed)
[facility]  06/11/2017 2:04 PM Yvonne [name]  MRN:  [id number]\  Subjective:  Yvonne [name] reports " I am okay, just waiting for placement"   Objective: Yvonne [name] is awake, alert. Seen resting in dayroom. Patient present flat,guarded and irritable during this assessment. Patient has short quick responses. "no and yes".  Denies suicidal or homicidal ideation. Denies auditory or visual hallucination and does not appear to be responding to internal stimuli. Patient reports " I am just waiting for placement where Rod, he was helping me?"  Reports group home or long term. Reports she is taken medications as ordered and states she is tolerating medications well.  Support, encouragement and reassurance was provided.   Principal Problem: Bipolar I disorder, current or most recent episode manic, with psychotic features (HCC) Diagnosis:   Patient Active Problem List   Diagnosis Date Noted  . Bipolar disorder, curr episode depressed, severe, w/psychotic features (HCC) [F31.5] 06/01/2017  . Bipolar I disorder, current or most recent episode manic, with psychotic features (HCC) [F31.2]   . Bipolar affective disorder, current episode mild (HCC) [F31.9] 01/05/2017  . Manic behavior (HCC) [F30.10]   . Facial cellulitis [L03.211] 02/10/2014   Total Time spent with patient: 30 minutes  Past Psychiatric History: see H&P  Past Medical History:  Past Medical History:  Diagnosis Date  . Bipolar 1 disorder (HCC)   . Medical history non-contributory   . Mental disorder   . No pertinent past medical history     Past Surgical History:  Procedure Laterality Date  . NO PAST SURGERIES     Family History:  Family History  Problem Relation Age of Onset  . Depression Sister   . Depression Brother   . Depression Sister   . CAD Mother   . Hypertension Father   . Diabetes Father   . Bipolar disorder Cousin    Family Psychiatric  History: see H&P Social History:  Social History   Substance and  Sexual Activity  Alcohol Use No     Social History   Substance and Sexual Activity  Drug Use No    Social History   Socioeconomic History  . Marital status: Single    Spouse name: None  . Number of children: None  . Years of education: None  . Highest education level: None  Social Needs  . Financial resource strain: None  . Food insecurity - worry: None  . Food insecurity - inability: None  . Transportation needs - medical: None  . Transportation needs - non-medical: None  Occupational History  . None  Tobacco Use  . Smoking status: Never Smoker  . Smokeless tobacco: Never Used  Substance and Sexual Activity  . Alcohol use: No  . Drug use: No  . Sexual activity: No  Other Topics Concern  . None  Social History Narrative  . None   Additional Social History:                         Sleep: Good  Appetite:  Good  Current Medications: Current Facility-Administered Medications  Medication Dose Route Frequency Provider Last Rate Last Dose  . acetaminophen (TYLENOL) tablet 650 mg  650 mg Oral Q6H PRN Rankin, Shuvon B, NP      . alum & mag hydroxide-simeth (MAALOX/MYLANTA) 200-200-20 MG/5ML suspension 30 mL  30 mL Oral Q4H PRN Rankin, Shuvon B, NP      . ARIPiprazole (ABILIFY) tablet 10 mg  10 mg Oral Daily  Micheal Likensainville, [name], MD   10 mg at 06/11/17 0813  . feeding supplement (ENSURE ENLIVE) (ENSURE ENLIVE) liquid 237 mL  237 mL Oral TID BM Rankin, Shuvon B, NP      . magnesium hydroxide (MILK OF MAGNESIA) suspension 30 mL  30 mL Oral Daily PRN Rankin, Shuvon B, NP      . multivitamin with minerals tablet 1 tablet  1 tablet Oral Daily Cobos, [name], MD   1 tablet at 06/11/17 0813    Lab Results: No results found for this or any previous visit (from the past 48 hour(s)).  Blood Alcohol level:  Lab Results  Component Value Date   ETH <10 05/11/2017   ETH <10 05/07/2017    Metabolic Disorder Labs: Lab Results  Component Value Date   HGBA1C 5.5  06/21/2012   MPG 111 06/21/2012   No results found for: PROLACTIN Lab Results  Component Value Date   CHOL 166 06/21/2012   TRIG 58 06/21/2012   HDL 58 06/21/2012   CHOLHDL 2.9 06/21/2012   VLDL 12 06/21/2012   LDLCALC 96 06/21/2012    Physical Findings: AIMS: Facial and Oral Movements Muscles of Facial Expression: None, normal Lips and Perioral Area: None, normal Jaw: None, normal Tongue: None, normal,Extremity Movements Upper (arms, wrists, hands, fingers): None, normal Lower (legs, knees, ankles, toes): None, normal, Trunk Movements Neck, shoulders, hips: None, normal, Overall Severity Severity of abnormal movements (highest score from questions above): None, normal Incapacitation due to abnormal movements: None, normal Patient's awareness of abnormal movements (rate only patient's report): No Awareness, Dental Status Current problems with teeth and/or dentures?: No Does patient usually wear dentures?: No  CIWA:  CIWA-Ar Total: 1 COWS:  COWS Total Score: 2  Musculoskeletal: Strength & Muscle Tone: within normal limits Gait & Station: normal Patient leans: N/A  Psychiatric Specialty Exam: Physical Exam  Nursing note and vitals reviewed. Constitutional: She is oriented to person, place, and time. She appears well-developed.  Neurological: She is alert and oriented to person, place, and time.  Psychiatric: She has a normal mood and affect.    Review of Systems  Psychiatric/Behavioral: Negative for depression, hallucinations and suicidal ideas. The patient is not nervous/anxious.     Blood pressure 97/69, pulse (!) 114, temperature 98.7 F (37.1 C), temperature source Oral, resp. rate 18, height 5\' 2"  (1.575 m), weight 43.5 kg (96 lb).Body mass index is 17.56 kg/m.  General Appearance: Casual paper scrubs, flat and guarded  Eye Contact:  Good  Speech:  Clear and Coherent and Normal Rate  Volume:  Normal  Mood:  Anxious  Affect:  Appropriate, Congruent and  Constricted  Thought Process:  Coherent and Goal Directed  Orientation:  Full (Time, Place, and Person)  Thought Content:  Hallucinations: None  Suicidal Thoughts:  No  Homicidal Thoughts:  No  Memory:  Immediate;   Good Recent;   Good Remote;   Good  Judgement:  Fair  Insight:  Fair  Psychomotor Activity:  Normal  Concentration:  Concentration: Good  Recall:  Good  Fund of Knowledge:  Good  Language:  Good  Akathisia:  No  Handed:    AIMS (if indicated):     Assets:  Desire for Improvement Physical Health Social Support  ADL's:  Intact  Cognition:  WNL  Sleep:  Number of Hours: 6.75     Treatment Plan Summary: Daily contact with patient to assess and evaluate symptoms and progress in treatment and Medication management.   Continue with  current treatment plan on 06/11/2017 except where noted  - Continue inpatient hospitalization.  - Bipolar I - ContinueAbilify 10mg  qDay  - RN staff to read PPD (results to be reported to possible group home placement options)  - Encourage participation in groups and the therapeutic milieu - Discharge planning will be ongoing  Oneta Rack, NP 06/11/2017, 2:04 PM

## 2017-06-11 NOTE — Progress Notes (Signed)
D. Pt presents with a flat affect, depressed mood and guarded behavior Pt observed attending group in am, but the majority of the day she remains in her room. Pt currently denies SI/HI and AVH. Pt reports feeling overwhelmed over her living situation.  A. Labs and vitals monitored. Pt compliant medications. Sat with pt and offered support and encouraged.Pt brightened with interaction R. Pt remains safe with 15 minute checks. Will continue POC.

## 2017-06-11 NOTE — Progress Notes (Signed)
Adult Psychoeducational Group Note  Date:  06/11/2017 Time:  9:28 PM  Group Topic/Focus:  Wrap-Up Group:   The focus of this group is to help patients review their daily goal of treatment and discuss progress on daily workbooks.  Participation Level:  Did Not Attend  Participation Quality:  Did not attend  Affect:  Did not attend  Cognitive:  Did not attend  Insight: None  Engagement in Group:  Did not attend  Modes of Intervention:  Did not attend  Additional Comments:  Pt did not attend evening wrap up group tonight.  Yvonne FurnaceChristopher  [name] 06/11/2017, 9:28 PM

## 2017-06-12 NOTE — Progress Notes (Addendum)
D. Pt presents with a flat affect and depressed mood, but brightens a little upon interaction. Pt remains guarded, but is cooperative and compliant with meds. Pt observed interacting in group this am and reports, that "she felt a little better" at the end of group. Pt did not attend nurse led group in the afternoon.Pt currently denies SI/HI and AVH.  A. Labs and vitals monitored. Pt compliant with medications. Pt supported emotionally and encouraged to express concerns and ask questions.   R. Pt remains safe with 15 minute checks. Will continue POC.

## 2017-06-12 NOTE — [facility] Group Notes (Signed)
[facility] Therapy [facility]  Date/Time:  06/12/2017  11:00AM-12:00PM  Type of Therapy and Topic:  Group Therapy:  Music and Mood  Participation Level:  Active   Description of Group: In this process group, members listened to a variety of genres of music and identified that different types of music evoke different responses.  Patients were encouraged to identify music that was soothing for them and music that was energizing for them.  Patients discussed how this knowledge can help with wellness and recovery in various ways including managing depression and anxiety as well as encouraging healthy sleep habits.    Therapeutic Goals: 1. Patients will explore the impact of different varieties of music on mood 2. Patients will verbalize the thoughts they have when listening to different types of music 3. Patients will identify music that is soothing to them as well as music that is energizing to them 4. Patients will discuss how to use this knowledge to assist in maintaining wellness and recovery 5. Patients will explore the use of music as a coping skill  Summary of Patient Progress:  At the beginning of group, patient expressed she felt sluggish and at the end of group said she felt "a little better."  Therapeutic Modalities: Solution Focused Brief Therapy Motivational Interviewing Activity   Ambrose MantleMareida Grossman-Orr, LCSW 06/12/2017 8:22 AM

## 2017-06-12 NOTE — Progress Notes (Signed)
D: Pt was in bed in her room upon initial approach.  Pt presents with depressed affect and mood.  When asked about her day, she reports it was "okay, nothing to brag about."  Pt states "I'm just waiting on them to place me somewhere else, I'm homeless, I hit rock bottom."  Pt denies SI/HI, denies hallucinations, denies pain.  Pt has isolated to her room for the majority of the night and she did not attend evening group.  A: Introduced self to pt.  Actively listened to pt and offered support and encouragement. Q15 minute safety checks maintained.  R: Pt is safe on the unit.  Pt verbally contracts for safety.  Will continue to monitor and assess.

## 2017-06-12 NOTE — Progress Notes (Signed)
[facility] MD Progress Note  06/12/2017 9:27 AM Yvonne [name]  MRN:  [id number]\  Subjective:  [facility] reports " I just want my life to get better, I don't know why I was moved over her from [facility] anyway"   Objective: [name] seen resting in bedroom. Reports feeling "fine" stats he is tired of waiting for placement, however reports I know this is the process because I am homeless. Continues to  present flat and guarded. Denies suicidal or homicidal ideation. Denies auditory or visual hallucination and does not appear to be responding to internal stimuli.  States she is taken medications as ordered and is  tolerating medications well.  Support, encouragement and reassurance was provided.   Principal Problem: Bipolar I disorder, current or most recent episode manic, with psychotic features (HCC) Diagnosis:   Patient Active Problem List   Diagnosis Date Noted  . Bipolar disorder, curr episode depressed, severe, w/psychotic features (HCC) [F31.5] 06/01/2017  . Bipolar I disorder, current or most recent episode manic, with psychotic features (HCC) [F31.2]   . Bipolar affective disorder, current episode mild (HCC) [F31.9] 01/05/2017  . Manic behavior (HCC) [F30.10]   . Facial cellulitis [L03.211] 02/10/2014   Total Time spent with patient: 30 minutes  Past Psychiatric History: see H&P  Past Medical History:  Past Medical History:  Diagnosis Date  . Bipolar 1 disorder (HCC)   . Medical history non-contributory   . Mental disorder   . No pertinent past medical history     Past Surgical History:  Procedure Laterality Date  . NO PAST SURGERIES     Family History:  Family History  Problem Relation Age of Onset  . Depression Sister   . Depression Brother   . Depression Sister   . CAD Mother   . Hypertension Father   . Diabetes Father   . Bipolar disorder Cousin    Family Psychiatric  History: see H&P Social History:  Social History   Substance and Sexual Activity  Alcohol Use  No     Social History   Substance and Sexual Activity  Drug Use No    Social History   Socioeconomic History  . Marital status: Single    Spouse name: None  . Number of children: None  . Years of education: None  . Highest education level: None  Social Needs  . Financial resource strain: None  . Food insecurity - worry: None  . Food insecurity - inability: None  . Transportation needs - medical: None  . Transportation needs - non-medical: None  Occupational History  . None  Tobacco Use  . Smoking status: Never Smoker  . Smokeless tobacco: Never Used  Substance and Sexual Activity  . Alcohol use: No  . Drug use: No  . Sexual activity: No  Other Topics Concern  . None  Social History Narrative  . None   Additional Social History:                         Sleep: Good  Appetite:  Good  Current Medications: Current Facility-Administered Medications  Medication Dose Route Frequency Provider Last Rate Last Dose  . acetaminophen (TYLENOL) tablet 650 mg  650 mg Oral Q6H PRN Rankin, Shuvon B, NP      . alum & mag hydroxide-simeth (MAALOX/MYLANTA) 200-200-20 MG/5ML suspension 30 mL  30 mL Oral Q4H PRN Rankin, Shuvon B, NP      . ARIPiprazole (ABILIFY) tablet 10 mg  10 mg  Oral Daily Micheal Likensainville, [name], MD   10 mg at 06/12/17 0747  . magnesium hydroxide (MILK OF MAGNESIA) suspension 30 mL  30 mL Oral Daily PRN Rankin, Shuvon B, NP      . multivitamin with minerals tablet 1 tablet  1 tablet Oral Daily Cobos, [name], MD   1 tablet at 06/12/17 0747    Lab Results: No results found for this or any previous visit (from the past 48 hour(s)).  Blood Alcohol level:  Lab Results  Component Value Date   ETH <10 05/11/2017   ETH <10 05/07/2017    Metabolic Disorder Labs: Lab Results  Component Value Date   HGBA1C 5.5 06/21/2012   MPG 111 06/21/2012   No results found for: PROLACTIN Lab Results  Component Value Date   CHOL 166 06/21/2012   TRIG 58  06/21/2012   HDL 58 06/21/2012   CHOLHDL 2.9 06/21/2012   VLDL 12 06/21/2012   LDLCALC 96 06/21/2012    Physical Findings: AIMS: Facial and Oral Movements Muscles of Facial Expression: None, normal Lips and Perioral Area: None, normal Jaw: None, normal Tongue: None, normal,Extremity Movements Upper (arms, wrists, hands, fingers): None, normal Lower (legs, knees, ankles, toes): None, normal, Trunk Movements Neck, shoulders, hips: None, normal, Overall Severity Severity of abnormal movements (highest score from questions above): None, normal Incapacitation due to abnormal movements: None, normal Patient's awareness of abnormal movements (rate only patient's report): No Awareness, Dental Status Current problems with teeth and/or dentures?: No Does patient usually wear dentures?: No  CIWA:  CIWA-Ar Total: 1 COWS:  COWS Total Score: 2  Musculoskeletal: Strength & Muscle Tone: within normal limits Gait & Station: normal Patient leans: N/A  Psychiatric Specialty Exam: Physical Exam  Nursing note and vitals reviewed. Constitutional: She is oriented to person, place, and time. She appears well-developed.  Neurological: She is alert and oriented to person, place, and time.  Skin: Skin is warm and dry.  Psychiatric: She has a normal mood and affect.    Review of Systems  Psychiatric/Behavioral: Negative for depression, hallucinations and suicidal ideas. The patient is not nervous/anxious.     Blood pressure 123/77, pulse (!) 107, temperature 97.7 F (36.5 C), temperature source Oral, resp. rate 16, height 5\' 2"  (1.575 m), weight 43.5 kg (96 lb).Body mass index is 17.56 kg/m.  General Appearance: Casual paper scrubs, flat and guarded  Eye Contact:  Good  Speech:  Clear and Coherent and Normal Rate  Volume:  Normal  Mood:  Anxious  Affect:  Appropriate, Congruent and Constricted  Thought Process:  Coherent and Goal Directed  Orientation:  Full (Time, Place, and Person)  Thought  Content:  Hallucinations: None  Suicidal Thoughts:  No  Homicidal Thoughts:  No  Memory:  Immediate;   Good Recent;   Good Remote;   Good  Judgement:  Fair  Insight:  Fair  Psychomotor Activity:  Normal-noted that patient is more visible on the unit during the day   Concentration:  Concentration: Good  Recall:  Good  Fund of Knowledge:  Good  Language:  Good  Akathisia:  No  Handed:    AIMS (if indicated):     Assets:  Desire for Improvement Physical Health Social Support  ADL's:  Intact  Cognition:  WNL  Sleep:  Number of Hours: 6.75     Treatment Plan Summary: Daily contact with patient to assess and evaluate symptoms and progress in treatment and Medication management.   Continue with current treatment plan on  06/12/2017 except where noted   - Bipolar I - ContinueAbilify 10mg  qDay  - RN staff to read PPD (results to be reported to possible group home placement options)  - Encourage participation in groups and the therapeutic milieu - Discharge planning will be ongoing  Oneta Rackanika N Lewis, NP 06/12/2017, 9:27 AM

## 2017-06-12 NOTE — Progress Notes (Signed)
D: Pt was in bed in her room upon initial approach.  Pt presents with depressed affect and mood.  Reports her day was "alright."  Goal is "trying to get out of here."  Reports the best part of her day was "now, just relaxing."  Pt denies SI/HI, denies hallucinations, denies pain.  Pt has been isolative to her room for the majority of the night.  She refused to attend evening group again.   A: Introduced self to pt.  Actively listened to pt and offered support and encouragement. Q15 minute safety checks maintained.  R: Pt is safe on the unit.  She reports she will inform staff of needs and concerns.  Pt verbally contracts for safety.  Will continue to monitor and assess.

## 2017-06-12 NOTE — [facility] Group Notes (Signed)
[facility] Group Notes:  (Nursing)  Date:  06/12/2017  Time:  1:30 Type of Therapy:  Nurse Education  Participation Level:  Did Not Attend  Participation Quality:  did not attend  Affect:  did not attend  Cognitive:  did not attend  Insight:  None  Engagement in Group:  None  Modes of Intervention:  did not attend  Summary of Progress/Problems: Patient encouraged to attend group, but pt chose to rest in bed  Shela NevinValerie S Kingdavid Leinbach 06/12/2017, 3:19 PM

## 2017-06-13 NOTE — Progress Notes (Signed)
DAR NOTE: Patient presents with anxious affect and depressed mood. Pt has been isolative, quite and not interacting much. Pt also not eating much, refused ensure, but been pushing fluids Denies pain, auditory and visual hallucinations.  Rates depression at, hopelessness at 4, and anxiety at 7.  Maintained on routine safety checks.  Medications given as prescribed.  Support and encouragement offered as needed.  Attended group. Will continue to monitor.

## 2017-06-13 NOTE — Progress Notes (Signed)
Recreation Therapy Notes  Date: 06/13/17 Time: 1000 Location: [address] Dayroom  Group Topic: Coping Skills  Goal Area(s) Addresses:  Patient will be able to identify positive coping skills. Patient will be able to identify the importance of using coping skills. Patient will be able to identify the benefits of using coping skills post d/c.  Behavioral Response: None  Intervention: Mind map, pencils, white board, marker, eraser  Activity: Mind map.  Patients were given a blank mind map.  LRT and patients filled in the first eight boxes together with anxiety, mourning a loss, depression, stop smoking, not being able to sleep, anger, loneliness and pain.  Individually, patients were to identify at least three coping skills for each situation identified.  Group would come back together and LRT would right the coping skills identified on the board.  Education: PharmacologistCoping Skills, Building control surveyorDischarge Planning.   Education Outcome: Acknowledges understanding/In group clarification offered/Needs additional education.   Clinical Observations/Feedback: Pt sat in group quietly and observed.  Pt was flat and appeared depressed.   Caroll RancherMarjette Zorawar Strollo, LRT/CTRS      Caroll RancherLindsay, Shanvi Moyd A 06/13/2017 11:41 AM

## 2017-06-13 NOTE — Tx Team (Signed)
Interdisciplinary Treatment and Diagnostic Plan Update  06/13/2017 Time of Session: 12:45 PM  Yvonne [name] MRN: [id number]  Principal Diagnosis: Bipolar I disorder, current or most recent episode manic, with psychotic features (HCC)  Secondary Diagnoses: Principal Problem:   Bipolar I disorder, current or most recent episode manic, with psychotic features (HCC) Active Problems:   Bipolar disorder, curr episode depressed, severe, w/psychotic features (HCC)   Current Medications:  Current Facility-Administered Medications  Medication Dose Route Frequency Provider Last Rate Last Dose  . acetaminophen (TYLENOL) tablet 650 mg  650 mg Oral Q6H PRN Rankin, Shuvon B, NP      . alum & mag hydroxide-simeth (MAALOX/MYLANTA) 200-200-20 MG/5ML suspension 30 mL  30 mL Oral Q4H PRN Rankin, Shuvon B, NP      . ARIPiprazole (ABILIFY) tablet 10 mg  10 mg Oral Daily Micheal Likensainville, [name], MD   10 mg at 06/13/17 0744  . magnesium hydroxide (MILK OF MAGNESIA) suspension 30 mL  30 mL Oral Daily PRN Rankin, Shuvon B, NP      . multivitamin with minerals tablet 1 tablet  1 tablet Oral Daily Cobos, [name], MD   1 tablet at 06/13/17 0744    PTA Medications: Medications Prior to Admission  Medication Sig Dispense Refill Last Dose  . ARIPiprazole (ABILIFY) 10 MG tablet Take 1 tablet (10 mg total) by mouth daily. 30 tablet 0 See note at ??  . carbamazepine (TEGRETOL XR) 200 MG 12 hr tablet Take 1 tablet (200 mg total) by mouth 2 (two) times daily. For mood stabilization 60 tablet 0 See note at ??  . hydrOXYzine (ATARAX/VISTARIL) 25 MG tablet Take 1 tablet (25 mg total) by mouth every 6 (six) hours as needed for anxiety. 60 tablet 0 See note at ??  . propranolol (INDERAL) 10 MG tablet Take 1 tablet (10 mg total) by mouth 2 (two) times daily. For anxiety 60 tablet 0 See note at ??  . traZODone (DESYREL) 50 MG tablet Take 1 tablet (50 mg total) by mouth at bedtime as needed for sleep. 30 tablet 0 See note at  ??    Patient Stressors: Financial difficulties Medication change or noncompliance Other: "I'm homeless"  Patient Strengths: Average or above average intelligence Communication skills General fund of knowledge  Treatment Modalities: Medication Management, Group therapy, Case management,  1 to 1 session with clinician, Psychoeducation, Recreational therapy.   Physician Treatment Plan for Primary Diagnosis: Bipolar I disorder, current or most recent episode manic, with psychotic features (HCC) Long Term Goal(s): Improvement in symptoms so as ready for discharge  Short Term Goals: Ability to identify changes in lifestyle to reduce recurrence of condition will improve Ability to verbalize feelings will improve Ability to disclose and discuss suicidal ideas Ability to demonstrate self-control will improve Ability to identify and develop effective coping behaviors will improve Ability to maintain clinical measurements within normal limits will improve Compliance with prescribed medications will improve Ability to identify triggers associated with substance abuse/mental health issues will improve Ability to identify changes in lifestyle to reduce recurrence of condition will improve Ability to verbalize feelings will improve Ability to disclose and discuss suicidal ideas Ability to demonstrate self-control will improve Ability to identify and develop effective coping behaviors will improve Ability to maintain clinical measurements within normal limits will improve Compliance with prescribed medications will improve Ability to identify triggers associated with substance abuse/mental health issues will improve  Medication Management: Evaluate patient's response, side effects, and tolerance of medication regimen.  Therapeutic Interventions:  1 to 1 sessions, Unit Group sessions and Medication administration.  Evaluation of Outcomes: Progressing       Physician Treatment Plan for  Secondary Diagnosis: Principal Problem:   Bipolar I disorder, current or most recent episode manic, with psychotic features (HCC) Active Problems:   Bipolar disorder, curr episode depressed, severe, w/psychotic features (HCC)   Long Term Goal(s): Improvement in symptoms so as ready for discharge  Short Term Goals: Ability to identify changes in lifestyle to reduce recurrence of condition will improve Ability to verbalize feelings will improve Ability to disclose and discuss suicidal ideas Ability to demonstrate self-control will improve Ability to identify and develop effective coping behaviors will improve Ability to maintain clinical measurements within normal limits will improve Compliance with prescribed medications will improve Ability to identify triggers associated with substance abuse/mental health issues will improve Ability to identify changes in lifestyle to reduce recurrence of condition will improve Ability to verbalize feelings will improve Ability to disclose and discuss suicidal ideas Ability to demonstrate self-control will improve Ability to identify and develop effective coping behaviors will improve Ability to maintain clinical measurements within normal limits will improve Compliance with prescribed medications will improve Ability to identify triggers associated with substance abuse/mental health issues will improve  Medication Management: Evaluate patient's response, side effects, and tolerance of medication regimen.  Therapeutic Interventions: 1 to 1 sessions, Unit Group sessions and Medication administration.  Evaluation of Outcomes: Progressing   RN Treatment Plan for Primary Diagnosis: Bipolar I disorder, current or most recent episode manic, with psychotic features (HCC) Long Term Goal(s): Knowledge of disease and therapeutic regimen to maintain health will improve  Short Term Goals: Compliance with prescribed medications will improve  Medication  Management: RN will administer medications as ordered by provider, will assess and evaluate patient's response and provide education to patient for prescribed medication. RN will report any adverse and/or side effects to prescribing provider.  Therapeutic Interventions: 1 on 1 counseling sessions, Psychoeducation, Medication administration, Evaluate responses to treatment, Monitor vital signs and CBGs as ordered, Perform/monitor CIWA, COWS, AIMS and Fall Risk screenings as ordered, Perform wound care treatments as ordered.  Evaluation of Outcomes: Progressing   11/28:  Pt has stability of her mood and psychotic symptoms, and she has been showing improvement in her appetite but she still has poor hygiene.  She is tolerating her medications without difficulty. She is in agreement to look into group home referrals. She was refusing meds as recently as 4 days ago, but once second opinion was rendered and she was offered injection, she chose PO.  - Continue inpatient hospitalization - Bipolar I - Continueabilify 10mg  qDay  12/3:  Bipolar I - Continueabilify 10mg  qDay CSW has gotten approval for LOG by [employer] [facility] [employer]ch[employer], [employer]uper[employer].  Have identified potential placement and sent information to them on pt[facility]       LCSW Treatment Plan for Primary Diagnosis: Bipolar I disorder, current or most recent episode manic, with psychotic features (HCC) Long Term Goal(s): Safe transition to appropriate next level of care at discharge, Engage patient in therapeutic group addressing interpersonal concerns.  Short Term Goals: Engage patient in aftercare planning with referrals and resources, Increase social support and Facilitate acceptance of mental health diagnosis and concerns  Therapeutic Interventions: Assess for all discharge needs, 1 to 1 time with Social worker, Explore available resources and support systems, Assess for adequacy in community support network, Educate family  and significant other(s) on suicide prevention, Complete Psychosocial Assessment, Interpersonal group  therapy.  Evaluation of Outcomes: Progressing   Progress in Treatment: Attending groups: Yes Participating in groups: No Taking medication as prescribed: Yes Toleration medication: Yes, no side effects reported at this time Family/Significant other contact made: Not yet Patient understands diagnosis: No, Limited insight Discussing patient identified problems/goals with staff: Yes Medical problems stabilized or resolved: Yes Denies suicidal/homicidal ideation: Yes Issues/concerns per patient self-inventory: None Other: N/A  New problem(s) identified: None identified at this time.   New Short Term/Long Term Goal(s): None identified at this time.   Discharge Plan or Barriers:CSW looking for placement with [facility] supplying LOG [possibly]  Back up plan is shelter.  Reason for Continuation of Hospitalization: Anxiety Depression Mania Medication Stabilization  Estimated Length of Stay: 12/7  Attendees: Patient: Physician: Jolyne Loa, MD        06/13/2017  12:45 PM Nursing:  Elby Beck        06/13/2017  12:45 PM RN Care Manager:     06/13/2017  12:45 PM Social Worker: Richelle Ito           06/13/2017  12:45 PM Recreational Therapist:        06/13/2017  12:45 PM Other:      06/13/2017  12:45 PM Other:   06/13/2017  12:45 PM   Scribe for Treatment Team: Richelle Ito LCSW 06/13/2017 12:45 PM

## 2017-06-13 NOTE — Progress Notes (Signed)
[facility]  06/13/2017 12:22 PM Yvonne [name]  MRN:  [id number] Subjective:   Subjective:   Yvonne BalsamMelvine Watt is a 51 y/o F with history of Bipolar I who was admitted initially on IVC petitioned by the court liason (pt later signed in) due to worsening symptoms of psychosis. As per IVC, pt had been non-adherent to prescribed medications, she was refusing medical treatment/not meeting her ADL's while in jail. Pt had not been eating food and had lost about 30 lbs while in jail, had poor hygiene, and had disorganized behaviors such as flushing linens down the toilet. Pt was restarted on abilify, and she had improvement of her symptoms. She has demonstrated good hygiene, organized behaviors, and she has been gaining weight. The SW team has been searching for eligible group homes. Today upon evaluation, pt reports she is doing well overall but she feels slightly depressed as there has not been any group home found for her yet. She denies SI/HI/AH/VH. She is sleeping well and her appetite is good. Ensure supplements were discontinued as pt has improved oral intake of her meals and she is gaining weight. Pt feels somewhat fatigued and and sluggish, but she associates that with lack of news on where she will be staying. Pt was encouraged to continue participating in groups and therapeutic milieu. She agrees to continue her current treatment regimen without changes. She had no further questions, comments, or concerns.     Principal Problem: Bipolar I disorder, current or most recent episode manic, with psychotic features (HCC) Diagnosis:   Patient Active Problem List   Diagnosis Date Noted  . Bipolar disorder, curr episode depressed, severe, w/psychotic features (HCC) [F31.5] 06/01/2017  . Bipolar I disorder, current or most recent episode manic, with psychotic features (HCC) [F31.2]   . Bipolar affective disorder, current episode mild (HCC) [F31.9] 01/05/2017  . Manic behavior (HCC) [F30.10]   .  Facial cellulitis [L03.211] 02/10/2014   Total Time spent with patient: 30 minutes  Past Psychiatric History: see H&P  Past Medical History:  Past Medical History:  Diagnosis Date  . Bipolar 1 disorder (HCC)   . Medical history non-contributory   . Mental disorder   . No pertinent past medical history     Past Surgical History:  Procedure Laterality Date  . NO PAST SURGERIES     Family History:  Family History  Problem Relation Age of Onset  . Depression Sister   . Depression Brother   . Depression Sister   . CAD Mother   . Hypertension Father   . Diabetes Father   . Bipolar disorder Cousin    Family Psychiatric  History: see H&P Social History:  Social History   Substance and Sexual Activity  Alcohol Use No     Social History   Substance and Sexual Activity  Drug Use No    Social History   Socioeconomic History  . Marital status: Single    Spouse name: None  . Number of children: None  . Years of education: None  . Highest education level: None  Social Needs  . Financial resource strain: None  . Food insecurity - worry: None  . Food insecurity - inability: None  . Transportation needs - medical: None  . Transportation needs - non-medical: None  Occupational History  . None  Tobacco Use  . Smoking status: Never Smoker  . Smokeless tobacco: Never Used  Substance and Sexual Activity  . Alcohol use: No  . Drug use: No  .  Sexual activity: No  Other Topics Concern  . None  Social History Narrative  . None   Additional Social History:                         Sleep: Good  Appetite:  Good  Current Medications: Current Facility-Administered Medications  Medication Dose Route Frequency Provider Last Rate Last Dose  . acetaminophen (TYLENOL) tablet 650 mg  650 mg Oral Q6H PRN Rankin, Shuvon B, NP      . alum & mag hydroxide-simeth (MAALOX/MYLANTA) 200-200-20 MG/5ML suspension 30 mL  30 mL Oral Q4H PRN Rankin, Shuvon B, NP      .  ARIPiprazole (ABILIFY) tablet 10 mg  10 mg Oral Daily Micheal Likensainville, Tristain Daily T, MD   10 mg at 06/13/17 0744  . magnesium hydroxide (MILK OF MAGNESIA) suspension 30 mL  30 mL Oral Daily PRN Rankin, Shuvon B, NP      . multivitamin with minerals tablet 1 tablet  1 tablet Oral Daily Cobos, [name], MD   1 tablet at 06/13/17 0744    Lab Results: No results found for this or any previous visit (from the past 48 hour(s)).  Blood Alcohol level:  Lab Results  Component Value Date   ETH <10 05/11/2017   ETH <10 05/07/2017    Metabolic Disorder Labs: Lab Results  Component Value Date   HGBA1C 5.5 06/21/2012   MPG 111 06/21/2012   No results found for: PROLACTIN Lab Results  Component Value Date   CHOL 166 06/21/2012   TRIG 58 06/21/2012   HDL 58 06/21/2012   CHOLHDL 2.9 06/21/2012   VLDL 12 06/21/2012   LDLCALC 96 06/21/2012    Physical Findings: AIMS: Facial and Oral Movements Muscles of Facial Expression: None, normal Lips and Perioral Area: None, normal Jaw: None, normal Tongue: None, normal,Extremity Movements Upper (arms, wrists, hands, fingers): None, normal Lower (legs, knees, ankles, toes): None, normal, Trunk Movements Neck, shoulders, hips: None, normal, Overall Severity Severity of abnormal movements (highest score from questions above): None, normal Incapacitation due to abnormal movements: None, normal Patient's awareness of abnormal movements (rate only patient's report): No Awareness, Dental Status Current problems with teeth and/or dentures?: No Does patient usually wear dentures?: No  CIWA:  CIWA-Ar Total: 1 COWS:  COWS Total Score: 2  Musculoskeletal: Strength & Muscle Tone: within normal limits Gait & Station: normal Patient leans: N/A  Psychiatric Specialty Exam: Physical Exam  Nursing note and vitals reviewed.   Review of Systems  Constitutional: Positive for malaise/fatigue. Negative for chills and fever.  Respiratory: Negative for cough.    Gastrointestinal: Negative for heartburn and nausea.  Psychiatric/Behavioral: Positive for depression. Negative for hallucinations and suicidal ideas. The patient is not nervous/anxious.     Blood pressure 124/80, pulse 93, temperature 98.4 F (36.9 C), temperature source Oral, resp. rate 16, height 5\' 2"  (1.575 m), weight 43.5 kg (96 lb).Body mass index is 17.56 kg/m.  General Appearance: Casual and Fairly Groomed  Eye Contact:  Good  Speech:  Clear and Coherent  Volume:  Normal  Mood:  Anxious and Depressed  Affect:  Appropriate, Congruent and Constricted  Thought Process:  Coherent and Goal Directed  Orientation:  Full (Time, Place, and Person)  Thought Content:  Logical  Suicidal Thoughts:  No  Homicidal Thoughts:  No  Memory:  Immediate;   Good Recent;   Good Remote;   Good  Judgement:  Fair  Insight:  Fair  Psychomotor Activity:  Normal  Concentration:  Concentration: Good  Recall:  Good  Fund of Knowledge:  Good  Language:  Good  Akathisia:  No  Handed:  Right  AIMS (if indicated):     Assets:  Communication Skills Resilience Social Support  ADL's:  Intact  Cognition:  WNL  Sleep:  Number of Hours: 9.75     Treatment Plan Summary: Daily contact with patient to assess and evaluate symptoms and progress in treatment and Medication management. Pt has some depression and anxiety about uncertainty of her group home placement, but overall she is has improvement of symptoms since admission and she would be appropriate for group home placement when available.    - Continue inpatient hospitalization.  - Bipolar I - Continueabilify 10mg  qDay  - Encourage participation in groups and the therapeutic milieu - Discharge planning will be ongoing    Micheal Likens, MD 06/13/2017, 12:22 PM

## 2017-06-14 NOTE — Progress Notes (Signed)
Recreation Therapy Notes  Date: 06/14/17 Time: 0950 Location: [address] Dayroom  Group Topic: Goal Setting  Goal Area(s) Addresses:  Patient will be able to identify at least 3 life goals.  Patient will be able to identify benefit of investing in life goals.  Patient will be able to identify benefit of setting life goals.   Behavioral Response:  None  Intervention: Worksheet  Activity: Life Goals.  Patients were given a worksheet broken down into 6 categories (family, friends, work/school, spirituality, body and mental health).  Patients were to identify what they were doing well, what they need to improve and make a goal to complete the improvement.  Education:  Discharge Planning, Coping Skills, Life Goals  Education Outcome: Acknowledges Education/In Group Clarification Provided/Needs Additional Education  Clinical Observations: Pt was flat and appeared depressed.  Pt attempted to complete the assignment but pt stated she was unable to focus on it and wasn't confident in her understanding of it.  Pt left early and did not return.   Caroll RancherMarjette Mehar [place], LRT/CTRS      Lillia AbedLindsay, Bosco Paparella A 06/14/2017 10:58 AM

## 2017-06-14 NOTE — Plan of Care (Signed)
Pt has been isolative today with minimal interaction.

## 2017-06-14 NOTE — Progress Notes (Signed)
D:Pt has been isolative to her room. She has a worried sad/affect. Pt says that she feels lonely and "like I can't get myself together." " I want to be able to get back on my feet." Pt reports that she lost her home when her father passed away. She says that she has a sister but they do not talk often.  A:Offered support, encouragement and 15 minute checks.  R:Pt denies si and hi. Safety maintained on the until.

## 2017-06-14 NOTE — Progress Notes (Signed)
Pt has been in her room in bed all evening.  She was awake some of the time, and writer was able to speak with her.  She denies SI/HI/AVH.  She says she is fine, but did not want to go to the evening group.  She did not get up to get a snack, not did she have any medications scheduled for the night.  Pt was encouraged to make her needs known to staff.  Pt voiced no needs or concerns.  Support and encouragement offered.  Discharge plans are in process.  Safety maintained with q15 minute checks.

## 2017-06-14 NOTE — Progress Notes (Signed)
[facility] MD Progress Note  06/14/2017 12:28 PM Yvonne [name]  MRN:  [id number] Subjective:    Yvonne [name] is a 51 y/o F with history of Bipolar I who was admitted initially on IVC petitioned by the court liason (pt later signed in) due to worsening symptoms of psychosis. As per IVC, pt had been non-adherent to prescribed medications, she was refusing medical treatment/not meeting her ADL's while in jail. Pt had not been eating food and had lost approximately 30 lbs while in jail. Pt also had poor hygiene and disorganized behaviors such as flushing linens down the toilet. Upon admission, pt was restarted on abilify, and she had improvement of her symptoms. Since being restarted on medications, pt has demonstrated good hygiene, organized behaviors, and she has been gaining weight. Pt is now appropriate for placement in group homes and SW team has been working on referrals to appropriate locations.   Today upon evaluation, pt reports she is doing "okay." She is sleeping well and her appetite is good. She is no longer needing Ensure because her appetite has improved. She denies SI/HI/AH/VH. She is tolerating her medication without difficulty or side effects. She is anticipating meeting with staff from a group home today that is evaluating her for possible placement. Pt has some anxiety about that upcoming meeting but otherwise she has no concerns. She agrees to continue her current regimen without changes. She had no further questions, comments, or concerns.    Principal Problem: Bipolar I disorder, current or most recent episode manic, with psychotic features (HCC) Diagnosis:   Patient Active Problem List   Diagnosis Date Noted  . Bipolar disorder, curr episode depressed, severe, w/psychotic features (HCC) [F31.5] 06/01/2017  . Bipolar I disorder, current or most recent episode manic, with psychotic features (HCC) [F31.2]   . Bipolar affective disorder, current episode mild (HCC) [F31.9] 01/05/2017  .  Manic behavior (HCC) [F30.10]   . Facial cellulitis [L03.211] 02/10/2014   Total Time spent with patient: 30 minutes  Past Psychiatric History: see H&P  Past Medical History:  Past Medical History:  Diagnosis Date  . Bipolar 1 disorder (HCC)   . Medical history non-contributory   . Mental disorder   . No pertinent past medical history     Past Surgical History:  Procedure Laterality Date  . NO PAST SURGERIES     Family History:  Family History  Problem Relation Age of Onset  . Depression Sister   . Depression Brother   . Depression Sister   . CAD Mother   . Hypertension Father   . Diabetes Father   . Bipolar disorder Cousin    Family Psychiatric  History: see H&P Social History:  Social History   Substance and Sexual Activity  Alcohol Use No     Social History   Substance and Sexual Activity  Drug Use No    Social History   Socioeconomic History  . Marital status: Single    Spouse name: None  . Number of children: None  . Years of education: None  . Highest education level: None  Social Needs  . Financial resource strain: None  . Food insecurity - worry: None  . Food insecurity - inability: None  . Transportation needs - medical: None  . Transportation needs - non-medical: None  Occupational History  . None  Tobacco Use  . Smoking status: Never Smoker  . Smokeless tobacco: Never Used  Substance and Sexual Activity  . Alcohol use: No  . Drug use: No  .  Sexual activity: No  Other Topics Concern  . None  Social History Narrative  . None   Additional Social History:                         Sleep: Good  Appetite:  Good  Current Medications: Current Facility-Administered Medications  Medication Dose Route Frequency Provider Last Rate Last Dose  . acetaminophen (TYLENOL) tablet 650 mg  650 mg Oral Q6H PRN Rankin, Shuvon B, NP      . alum & mag hydroxide-simeth (MAALOX/MYLANTA) 200-200-20 MG/5ML suspension 30 mL  30 mL Oral Q4H PRN  Rankin, Shuvon B, NP      . ARIPiprazole (ABILIFY) tablet 10 mg  10 mg Oral Daily Micheal Likensainville, Ikey Omary T, MD   10 mg at 06/14/17 0745  . magnesium hydroxide (MILK OF MAGNESIA) suspension 30 mL  30 mL Oral Daily PRN Rankin, Shuvon B, NP      . multivitamin with minerals tablet 1 tablet  1 tablet Oral Daily Cobos, [name], MD   1 tablet at 06/14/17 0745    Lab Results: No results found for this or any previous visit (from the past 48 hour(s)).  Blood Alcohol level:  Lab Results  Component Value Date   ETH <10 05/11/2017   ETH <10 05/07/2017    Metabolic Disorder Labs: Lab Results  Component Value Date   HGBA1C 5.5 06/21/2012   MPG 111 06/21/2012   No results found for: PROLACTIN Lab Results  Component Value Date   CHOL 166 06/21/2012   TRIG 58 06/21/2012   HDL 58 06/21/2012   CHOLHDL 2.9 06/21/2012   VLDL 12 06/21/2012   LDLCALC 96 06/21/2012    Physical Findings: AIMS: Facial and Oral Movements Muscles of Facial Expression: None, normal Lips and Perioral Area: None, normal Jaw: None, normal Tongue: None, normal,Extremity Movements Upper (arms, wrists, hands, fingers): None, normal Lower (legs, knees, ankles, toes): None, normal, Trunk Movements Neck, shoulders, hips: None, normal, Overall Severity Severity of abnormal movements (highest score from questions above): None, normal Incapacitation due to abnormal movements: None, normal Patient's awareness of abnormal movements (rate only patient's report): No Awareness, Dental Status Current problems with teeth and/or dentures?: No Does patient usually wear dentures?: No  CIWA:  CIWA-Ar Total: 1 COWS:  COWS Total Score: 2  Musculoskeletal: Strength & Muscle Tone: within normal limits Gait & Station: normal Patient leans: N/A  Psychiatric Specialty Exam: Physical Exam  Nursing note and vitals reviewed.   Review of Systems  Constitutional: Negative for chills and fever.  Respiratory: Negative for cough and  shortness of breath.   Cardiovascular: Negative for chest pain and palpitations.  Gastrointestinal: Negative for heartburn.  Psychiatric/Behavioral: Negative for depression, hallucinations and suicidal ideas. The patient is nervous/anxious.     Blood pressure 97/75, pulse (!) 114, temperature 98.5 F (36.9 C), temperature source Oral, resp. rate 18, height 5\' 2"  (1.575 m), weight 43.5 kg (96 lb).Body mass index is 17.56 kg/m.  General Appearance: Casual and Disheveled  Eye Contact:  Good  Speech:  Clear and Coherent and Normal Rate  Volume:  Normal  Mood:  Anxious  Affect:  Appropriate, Congruent and Constricted  Thought Process:  Coherent and Goal Directed  Orientation:  Full (Time, Place, and Person)  Thought Content:  Logical  Suicidal Thoughts:  No  Homicidal Thoughts:  No  Memory:  Immediate;   Good Recent;   Good Remote;   Good  Judgement:  Good  Insight:  Fair  Psychomotor Activity:  Normal  Concentration:  Concentration: Fair  Recall:  [employer]Fair  Fund of Knowledge:  Fair  Language:  Fair  Akathisia:  No  Handed:    AIMS (if indicated):     Assets:  Communication Skills Desire for Improvement Physical Health Resilience Social Support  ADL's:  Intact  Cognition:  WNL  Sleep:  Number of Hours: 6.75     Treatment Plan Summary: Daily contact with patient to assess and evaluate symptoms and progress in treatment and Medication management. Pt has some ongoing anxiety, but overall she has stability of her mood symptoms and she is appropriate for placement at group home once she has been accepted; we are anticipating group home to evaluate pt today.   - Continue inpatient hospitalization.  - Bipolar I - Continueabilify 10mg  qDay  - Encourage participation in groups and the therapeutic milieu - Discharge planning will be ongoing     Micheal Likenshristopher T Itzel Lowrimore, MD 06/14/2017, 12:28 PM

## 2017-06-14 NOTE — Progress Notes (Signed)
Did not attend group 

## 2017-06-15 DIAGNOSIS — R450 Nervousness: Secondary | ICD-10-CM

## 2017-06-15 DIAGNOSIS — F315 Bipolar disorder, current episode depressed, severe, with psychotic features: Secondary | ICD-10-CM

## 2017-06-15 DIAGNOSIS — F419 Anxiety disorder, unspecified: Secondary | ICD-10-CM

## 2017-06-15 NOTE — Plan of Care (Signed)
Nurse discussed depression, anxiety, coping skills with patient.  

## 2017-06-15 NOTE — Progress Notes (Signed)
D:  Patient sitting in dayroom this morning.  Patient denied SI and HI, contracts for safety.  Denied A/V hallucinations.  Denied pain.  Patient stated she is worried about she is going to live [facility]. A:  Medications administered per MD orders.  Emotional support and encouragement given patient. R:   Safety maintained with 15 minute checks.

## 2017-06-15 NOTE — Progress Notes (Signed)
Yvonne [name]  06/15/2017 12:29 PM Yvonne [name]  MRN:  [id number] Subjective:   Yvonne [name] is a 51 y/o F with history of Bipolar I who was admittedwith worsening symptoms of psychosis initially on IVC but later she signed in voluntarily. Pt had been non-adherent to prescribed medications, and she was refusing medical treatment/not meeting her ADL's while in jail. She had lost about 30lbs, she had poor hygiene, and disorganized behaviors. Upon admission, pt was restarted on abilify, and she had improvement of her symptoms. Since being restarted on medications, pt has maintained good hygiene and organized behaviors. She has been gaining weight. SW team has been working on referrals to appropriate locations.  Today upon evaluation, pt reports she is doing "alright." She is sleeping well and her appetite is good. Pt has somewhat depressed affect, and she notes that she feels anxious about not knowing where she will stay after discharge. She denies SI/HI/AH/VH. She is anticipating staff from group home to come and evaluate her for placement today. She feels that abilify has been helpful for her, and she would like to continue her current regimen without changes. She had no further questions, comments, or concerns.    Principal Problem: Bipolar I disorder, current or most recent episode manic, with psychotic features (HCC) Diagnosis:   Patient Active Problem List   Diagnosis Date Noted  . Bipolar disorder, curr episode depressed, severe, w/psychotic features (HCC) [F31.5] 06/01/2017  . Bipolar I disorder, current or most recent episode manic, with psychotic features (HCC) [F31.2]   . Bipolar affective disorder, current episode mild (HCC) [F31.9] 01/05/2017  . Manic behavior (HCC) [F30.10]   . Facial cellulitis [L03.211] 02/10/2014   Total Time spent with patient: 30 minutes  Past Psychiatric History: see H&P  Past Medical History:  Past Medical History:  Diagnosis Date  . Bipolar 1  disorder (HCC)   . Medical history non-contributory   . Mental disorder   . No pertinent past medical history     Past Surgical History:  Procedure Laterality Date  . NO PAST SURGERIES     Family History:  Family History  Problem Relation Age of Onset  . Depression Sister   . Depression Brother   . Depression Sister   . CAD Mother   . Hypertension Father   . Diabetes Father   . Bipolar disorder Cousin    Family Psychiatric  History: see H&P Social History:  Social History   Substance and Sexual Activity  Alcohol Use No     Social History   Substance and Sexual Activity  Drug Use No    Social History   Socioeconomic History  . Marital status: Single    Spouse name: None  . Number of children: None  . Years of education: None  . Highest education level: None  Social Needs  . Financial resource strain: None  . Food insecurity - worry: None  . Food insecurity - inability: None  . Transportation needs - medical: None  . Transportation needs - non-medical: None  Occupational History  . None  Tobacco Use  . Smoking status: Never Smoker  . Smokeless tobacco: Never Used  Substance and Sexual Activity  . Alcohol use: No  . Drug use: No  . Sexual activity: No  Other Topics Concern  . None  Social History Narrative  . None   Additional Social History:  Sleep: Good  Appetite:  Good  Current Medications: Current Facility-Administered Medications  Medication Dose Route Frequency Provider Last Rate Last Dose  . acetaminophen (TYLENOL) tablet 650 mg  650 mg Oral Q6H PRN Rankin, Shuvon B, NP      . alum & mag hydroxide-simeth (MAALOX/MYLANTA) 200-200-20 MG/5ML suspension 30 mL  30 mL Oral Q4H PRN Rankin, Shuvon B, NP      . ARIPiprazole (ABILIFY) tablet 10 mg  10 mg Oral Daily Micheal Likensainville, Jereme Loren T, MD   10 mg at 06/15/17 0759  . magnesium hydroxide (MILK OF MAGNESIA) suspension 30 mL  30 mL Oral Daily PRN Rankin, Shuvon B, NP       . multivitamin with minerals tablet 1 tablet  1 tablet Oral Daily Cobos, [name], MD   1 tablet at 06/15/17 0759    Lab Results: No results found for this or any previous visit (from the past 48 hour(s)).  Blood Alcohol level:  Lab Results  Component Value Date   ETH <10 05/11/2017   ETH <10 05/07/2017    Metabolic Disorder Labs: Lab Results  Component Value Date   HGBA1C 5.5 06/21/2012   MPG 111 06/21/2012   No results found for: PROLACTIN Lab Results  Component Value Date   CHOL 166 06/21/2012   TRIG 58 06/21/2012   HDL 58 06/21/2012   CHOLHDL 2.9 06/21/2012   VLDL 12 06/21/2012   LDLCALC 96 06/21/2012    Physical Findings: AIMS: Facial and Oral Movements Muscles of Facial Expression: None, normal Lips and Perioral Area: None, normal Jaw: None, normal Tongue: None, normal,Extremity Movements Upper (arms, wrists, hands, fingers): None, normal Lower (legs, knees, ankles, toes): None, normal, Trunk Movements Neck, shoulders, hips: None, normal, Overall Severity Severity of abnormal movements (highest score from questions above): None, normal Incapacitation due to abnormal movements: None, normal Patient's awareness of abnormal movements (rate only patient's report): No Awareness, Dental Status Current problems with teeth and/or dentures?: No Does patient usually wear dentures?: No  CIWA:  CIWA-Ar Total: 1 COWS:  COWS Total Score: 1  Musculoskeletal: Strength & Muscle Tone: within normal limits Gait & Station: normal Patient leans: N/A  Psychiatric Specialty Exam: Physical Exam  Nursing [name] and vitals reviewed.   Review of Systems  Constitutional: Negative for fever.  Respiratory: Negative for cough.   Gastrointestinal: Negative for heartburn and nausea.  Psychiatric/Behavioral: Positive for depression. Negative for hallucinations and suicidal ideas. The patient is nervous/anxious.     Blood pressure 102/70, pulse 80, temperature 98.6 F (37 C),  resp. rate 16, height 5\' 2"  (1.575 m), weight 43.5 kg (96 lb).Body mass index is 17.56 kg/m.  General Appearance: Casual and Fairly Groomed  Eye Contact:  Good  Speech:  Clear and Coherent and Normal Rate  Volume:  Normal  Mood:  Anxious and Depressed  Affect:  Appropriate, Congruent and Constricted  Thought Process:  Coherent and Goal Directed  Orientation:  Full (Time, Place, and Person)  Thought Content:  Logical  Suicidal Thoughts:  No  Homicidal Thoughts:  No  Memory:  Immediate;   Good Recent;   Good Remote;   Good  Judgement:  Fair  Insight:  Fair  Psychomotor Activity:  Normal  Concentration:  Concentration: Good  Recall:  Good  Fund of Knowledge:  Good  Language:  Good  Akathisia:  No  Handed:    AIMS (if indicated):     Assets:  Communication Skills Leisure Time Physical Health Resilience Social Support  ADL's:  Intact  Cognition:  WNL  Sleep:  Number of Hours: 6.75     Treatment Plan Summary: Daily contact with patient to assess and evaluate symptoms and progress in treatment. Pt has stability of her mood symptoms, psychotic symptoms, and ability to meet her own needs. She has been gaining weight. She has good adherence to the treatment plan. She is appropriate for discharge to group home when available, but at this time they still need to evaluate her to be accepted, and as there is not an available safe discharge location at this time, pt will remain on inpatient setting.   - Continue inpatient hospitalization.  - Bipolar I - Continueabilify 10mg  qDay  - Encourage participation in groups and the therapeutic milieu - Discharge planning will be ongoing     Micheal Likenshristopher T Carla Whilden, MD 06/15/2017, 12:29 PM

## 2017-06-15 NOTE — Progress Notes (Signed)
Recreation Therapy Notes  Date: 06/15/17 Time: 1000 Location: 500 Hall Dayroom  Group Topic: Anger Management  Goal Area(s) Addresses:  Patient will identify triggers for anger.  Patient will identify physical reaction to anger.   Patient will identify benefit of using coping skills when angry.  Behavioral Response:  None  Intervention: Worksheet  Activity: The [employer]Umbrella Emotion.  Patients were given a worksheet with an empty umbrella.  Patients were to identify and write in the umbrella the emotions and situations that can cause anger.  Patients were to then write at least 5 coping skills on the outside of the umbrella they could use to deal with their anger.  Education:Anger Management, Discharge Planning   Education Outcome: Acknowledges education/In group clarification offered/Needs additional education.   Clinical Observations/Feedback:  Pt did not participate but sat quietly and listened.    Caroll RancherMarjette Deaunna Olarte, LRT/CTRS       Lillia AbedLindsay, Nechama Escutia A 06/15/2017 1:00 PM

## 2017-06-15 NOTE — Progress Notes (Signed)
Pt continues to isolate to her room.  She told Clinical research associatewriter that she was tired and just wanted to lie down.  She declined getting up to get a snack.  She denies SI/HI/AVH.  She voiced no needs or concerns.  Pt has been getting up in the mornings to go to breakfast, so she is eating.  Support and encouragement offered.  Discharge plans are in process.  Pt is on the list for [facility].  Safety maintained with q15 minute checks.

## 2017-06-16 NOTE — Progress Notes (Signed)
Nursing Progress Note 1900-0730  D) Patient presents with flat affect and depressed mood. Patient is isolative to her room and did not attend group. Patient provided snacks and fluids. Patient denies needs or concerns for writer this evening. Patient denies SI/HI/AVH or pain. Patient contracts for safety on the unit. No medications scheduled/requested/indicated this shift.  A) Emotional support given. 1:1 interaction and active listening provided. Snacks and fluids provided. Opportunities for questions or concerns presented to patient. Patient encouraged to continue to work on treatment goals. Labs, vital signs and patient behavior monitored throughout shift. Patient safety maintained with q15 min safety checks. Low fall risk precautions in place and reviewed with patient; patient verbalized understanding.  R) Patient receptive to interaction with nurse. Patient remains safe on the unit at this time. Patient denies any adverse medication reactions at this time. Patient is resting in bed without complaints. Will continue to monitor.

## 2017-06-16 NOTE — Progress Notes (Signed)
D: Pt presents very flat with depressed mood. Denies SI, HI, AVH and pain. Returned to unit early from cafeteria for meals "I just anxious, I felt like I was about to have a panic attack in the cafeteria this morning". Pt expressed her concerns related to d/c and placement "do you think someone will come see me today". Pt attended unit groups but will not stay. Compliant with medications when offered, denies adverse drug reactions. Group home owner visited with pt this shift, per owner "we will probably pick her up on Monday with this snow coming". A: All medications given as per MD's orders. Compliance with treatment regimen encouraged. Emotional support and availability offered to pt throughout this shift. Q 15 minutes safety checks continues without self harm gestures at this time.  R: Pt receptive to care. Cooperative with unit routines. Remains safe on and off unit. Denies concerns at this time.

## 2017-06-16 NOTE — Progress Notes (Signed)
[facility]  06/16/2017 12:20 PM Yvonne [name]  MRN:  [id number] Subjective:   Yvonne [name] is a 51 y/o F with history of Bipolar I who was admittedwith worsening symptoms of psychosis initially on IVC but later she signed in voluntarily. Prior to hospitalization, pt had been non-adherent to prescribed medications. She was in jail prior to admission and she had been refusing medical treatment/not meeting her ADL's. After admission, ptwas restarted on abilify. She had improvement of her symptoms including improved hygiene and food/liquid intake. She has been gaining weight. She is adherent to her recommended treatment regimen, but she is unable to meet her own basic needs outside the hospital and she is requiring to be placed in a group home. Today upon evaluation, pt reports she is feeling depressed. She states, "I just can't get myself together." Pt denies SI/HI/AH/VH. Pt also endorses anxiety. Much of her anxiety and depressive symptoms are related to uncertainty of her discharge location, and pt was reassured that we will continue to work with resources in the community to find her placement. Pt reports she is sleeping well and her appetite is good. She is tolerating her medications well without difficulty or side effects. She agrees to continue her current treatment plan without changes, and we will continue to research group home placement options.    Principal Problem: Bipolar I disorder, current or most recent episode manic, with psychotic features (HCC) Diagnosis:   Patient Active Problem List   Diagnosis Date Noted  . Bipolar disorder, curr episode depressed, severe, w/psychotic features (HCC) [F31.5] 06/01/2017  . Bipolar I disorder, current or most recent episode manic, with psychotic features (HCC) [F31.2]   . Bipolar affective disorder, current episode mild (HCC) [F31.9] 01/05/2017  . Manic behavior (HCC) [F30.10]   . Facial cellulitis [L03.211] 02/10/2014   Total Time  spent with patient: 30 minutes  Past Psychiatric History: see H&P  Past Medical History:  Past Medical History:  Diagnosis Date  . Bipolar 1 disorder (HCC)   . Medical history non-contributory   . Mental disorder   . No pertinent past medical history     Past Surgical History:  Procedure Laterality Date  . NO PAST SURGERIES     Family History:  Family History  Problem Relation Age of Onset  . Depression Sister   . Depression Brother   . Depression Sister   . CAD Mother   . Hypertension Father   . Diabetes Father   . Bipolar disorder Cousin    Family Psychiatric  History: see H&P Social History:  Social History   Substance and Sexual Activity  Alcohol Use No     Social History   Substance and Sexual Activity  Drug Use No    Social History   Socioeconomic History  . Marital status: Single    Spouse name: None  . Number of children: None  . Years of education: None  . Highest education level: None  Social Needs  . Financial resource strain: None  . Food insecurity - worry: None  . Food insecurity - inability: None  . Transportation needs - medical: None  . Transportation needs - non-medical: None  Occupational History  . None  Tobacco Use  . Smoking status: Never Smoker  . Smokeless tobacco: Never Used  Substance and Sexual Activity  . Alcohol use: No  . Drug use: No  . Sexual activity: No  Other Topics Concern  . None  Social History Narrative  . None  Additional Social History:                         Sleep: Good  Appetite:  Good  Current Medications: Current Facility-Administered Medications  Medication Dose Route Frequency Provider Last Rate Last Dose  . acetaminophen (TYLENOL) tablet 650 mg  650 mg Oral Q6H PRN Rankin, Shuvon B, NP      . alum & mag hydroxide-simeth (MAALOX/MYLANTA) 200-200-20 MG/5ML suspension 30 mL  30 mL Oral Q4H PRN Rankin, Shuvon B, NP      . ARIPiprazole (ABILIFY) tablet 10 mg  10 mg Oral Daily  Micheal Likens, MD   10 mg at 06/16/17 0754  . magnesium hydroxide (MILK OF MAGNESIA) suspension 30 mL  30 mL Oral Daily PRN Rankin, Shuvon B, NP      . multivitamin with minerals tablet 1 tablet  1 tablet Oral Daily Cobos, Rockey Situ, MD   1 tablet at 06/16/17 0754    Lab Results: No results found for this or any previous visit (from the past 48 hour(s)).  Blood Alcohol level:  Lab Results  Component Value Date   ETH <10 05/11/2017   ETH <10 05/07/2017    Metabolic Disorder Labs: Lab Results  Component Value Date   HGBA1C 5.5 06/21/2012   MPG 111 06/21/2012   No results found for: PROLACTIN Lab Results  Component Value Date   CHOL 166 06/21/2012   TRIG 58 06/21/2012   HDL 58 06/21/2012   CHOLHDL 2.9 06/21/2012   VLDL 12 06/21/2012   LDLCALC 96 06/21/2012    Physical Findings: AIMS: Facial and Oral Movements Muscles of Facial Expression: None, normal Lips and Perioral Area: None, normal Jaw: None, normal Tongue: None, normal,Extremity Movements Upper (arms, wrists, hands, fingers): None, normal Lower (legs, knees, ankles, toes): None, normal, Trunk Movements Neck, shoulders, hips: None, normal, Overall Severity Severity of abnormal movements (highest score from questions above): None, normal Incapacitation due to abnormal movements: None, normal Patient's awareness of abnormal movements (rate only patient's report): No Awareness, Dental Status Current problems with teeth and/or dentures?: No Does patient usually wear dentures?: No  CIWA:  CIWA-Ar Total: 1 COWS:  COWS Total Score: 1  Musculoskeletal: Strength & Muscle Tone: within normal limits Gait & Station: normal Patient leans: N/A  Psychiatric Specialty Exam: Physical Exam  Nursing note and vitals reviewed.   Review of Systems  Constitutional: Negative for chills and fever.  Respiratory: Negative for cough.   Cardiovascular: Negative for chest pain.  Gastrointestinal: Negative for heartburn  and nausea.  Psychiatric/Behavioral: Positive for depression. Negative for hallucinations and suicidal ideas. The patient is nervous/anxious.     Blood pressure 119/71, pulse (!) 127, temperature 98.2 F (36.8 C), temperature source Oral, resp. rate 16, height 5\' 2"  (1.575 m), weight 43.5 kg (96 lb).Body mass index is 17.56 kg/m.  General Appearance: Casual and Disheveled  Eye Contact:  Fair  Speech:  Clear and Coherent and Slow  Volume:  Decreased  Mood:  Anxious and Depressed  Affect:  Congruent, Constricted and Depressed  Thought Process:  Coherent and Goal Directed  Orientation:  Full (Time, Place, and Person)  Thought Content:  Logical  Suicidal Thoughts:  No  Homicidal Thoughts:  No  Memory:  Immediate;   Fair Recent;   Fair Remote;   Fair  Judgement:  Fair  Insight:  Fair  Psychomotor Activity:  Normal  Concentration:  Concentration: Fair  Recall:  [employer] of Knowledge:  Fair  Language:  Fair  Akathisia:  No  Handed:    AIMS (if indicated):     Assets:  Communication Skills Leisure Time Physical Health Resilience Social Support  ADL's:  Intact  Cognition:  WNL  Sleep:  Number of Hours: 6     Treatment Plan Summary: Daily contact with patient to assess and evaluate symptoms and progress in treatment and Medication management. Pt has stability of her presenting symptoms, and she is tolerating her current medication regimen well. She has some anxiety and depression about uncertainty of her discharge location, but pt was able to be reassured that we are continuing to work on her placement and [facility].   - Continue inpatient hospitalization.  - Bipolar I - Continueabilify 10mg  qDay  - Encourage participation in groups and the therapeutic milieu - Discharge planning will be ongoing     Micheal Likenshristopher T Ariel Wingrove, MD 06/16/2017, 12:20 PM

## 2017-06-16 NOTE — Progress Notes (Signed)
D:  Kailana continues to isolate in her room.  She continues to report feeling anxious about a plan for her discharge.  She did get up for dinner and stated that she ate pretty good.  She denies any SI/HI or A/V hallucinations.  She denies any pain or discomfort and appears to be in no physical distress.   A:  1:1 interaction with RN for support and encouragement.  Medications as ordered.  Q 15 minute checks maintained for safety.  Encouraged participation in group and unit activities.   R:  Dellar remains safe on the unit.  We will continue to monitor the progress towards her goals.

## 2017-06-16 NOTE — Progress Notes (Signed)
Recreation Therapy Notes  Date:06/16/17 Time: 1000 Location:500 Hall Dayroom  Group Topic:Self-Esteem  Goal Area(s) Addresses:  Patient will successfully identify positive attributes about themselves.  Patient will successfully identify benefit of improved self-esteem.   Behavioral Response: None  Intervention: Construction paper, markers  Activity: [employer] Advertisement. Patients were to create an advertisement that describes and highlights the positive traits and qualities about themselves.  Education:Self-Esteem, Discharge Planning.   Education Outcome: Acknowledges education/In group clarification offered/Needs additional education  Clinical Observations/Feedback:Pt did not participate.  Pt was flat and depressed.  Pt sat and observed. Pt left a few times and came back before finally leaving for good.    Caroll RancherMarjette Hanne Kegg, LRT/CTRS       Caroll RancherLindsay, Madison Direnzo A 06/16/2017 12:08 PM

## 2017-06-16 NOTE — Progress Notes (Signed)
Nursing Progress Note 0100-0730  D) Patient is resting in bed in no acute distress.   A) Labs, vital signs and patient behavior monitored throughout shift. Patient safety maintained with q15 min safety checks. Low fall risk precautions in place and reviewed with patient; patient verbalized understanding.  R) Patient remains safe on the unit at this time.  Patient is resting in bed without complaints. Will continue to monitor.   

## 2017-06-16 NOTE — Progress Notes (Signed)
Did not attend group 

## 2017-06-17 NOTE — Progress Notes (Signed)
Recreation Therapy Notes  Date: 06/17/17 Time: 1045 Location: Gym  Group Topic: Wellness  Goal Area(s) Addresses:  Patient will define components of whole wellness. Patient will verbalize benefit of whole wellness.  Behavioral Response: None  Intervention: 4 rubber bases, rubber ball   Activity: Kickball.  Patients were divided into two teams.  Each team would go until they reached three outs.  The teams would then reverse fields and the other team would get [name] chance to go.  The team that finished with the most points, wins the game.  Education: Wellness, Building control surveyorDischarge Planning.   Education Outcome: Acknowledges education/In group clarification offered/Needs additional education.   Clinical Observations/Feedback:  Pt did not participate.  Pt just observed.  Pt was flat and appeared to be depressed.    Caroll RancherMarjette Emlyn [name], Yvonne [name]         Caroll RancherLindsay, Yvonne [name] 06/17/2017 11:51 AM

## 2017-06-17 NOTE — Tx Team (Signed)
Interdisciplinary Treatment and Diagnostic Plan Update  06/17/2017 Time of Session: 8:17 AM  Yvonne [name] MRN: [id number]  Principal Diagnosis: Bipolar I disorder, current or most recent episode manic, with psychotic features (HCC)  Secondary Diagnoses: Principal Problem:   Bipolar I disorder, current or most recent episode manic, with psychotic features (HCC) Active Problems:   Bipolar disorder, curr episode depressed, severe, w/psychotic features (HCC)   Current Medications:  Current Facility-Administered Medications  Medication Dose Route Frequency Provider Last Rate Last Dose  . acetaminophen (TYLENOL) tablet 650 mg  650 mg Oral Q6H PRN Rankin, Shuvon B, NP      . alum & mag hydroxide-simeth (MAALOX/MYLANTA) 200-200-20 MG/5ML suspension 30 mL  30 mL Oral Q4H PRN Rankin, Shuvon B, NP      . ARIPiprazole (ABILIFY) tablet 10 mg  10 mg Oral Daily Micheal [name], [name], MD   10 mg at 06/17/17 0745  . magnesium hydroxide (MILK OF MAGNESIA) suspension 30 mL  30 mL Oral Daily PRN Rankin, Shuvon B, NP      . multivitamin with minerals tablet 1 tablet  1 tablet Oral Daily Cobos, Yvonne SituFernando A, MD   1 tablet at 06/17/17 0745    PTA Medications: Medications Prior to Admission  Medication Sig Dispense Refill Last Dose  . ARIPiprazole (ABILIFY) 10 MG tablet Take 1 tablet (10 mg total) by mouth daily. 30 tablet 0 See note at ??  . carbamazepine (TEGRETOL XR) 200 MG 12 hr tablet Take 1 tablet (200 mg total) by mouth 2 (two) times daily. For mood stabilization 60 tablet 0 See note at ??  . hydrOXYzine (ATARAX/VISTARIL) 25 MG tablet Take 1 tablet (25 mg total) by mouth every 6 (six) hours as needed for anxiety. 60 tablet 0 See note at ??  . propranolol (INDERAL) 10 MG tablet Take 1 tablet (10 mg total) by mouth 2 (two) times daily. For anxiety 60 tablet 0 See note at ??  . traZODone (DESYREL) 50 MG tablet Take 1 tablet (50 mg total) by mouth at bedtime as needed for sleep. 30 tablet 0 See note at ??     Patient Stressors: Financial difficulties Medication change or noncompliance Other: "I'm homeless"  Patient Strengths: Average or above average intelligence Communication skills General fund of knowledge  Treatment Modalities: Medication Management, Group therapy, Case management,  1 to 1 session with clinician, Psychoeducation, Recreational therapy.   Physician Treatment Plan for Primary Diagnosis: Bipolar I disorder, current or most recent episode manic, with psychotic features (HCC) Long Term Goal(s): Improvement in symptoms so as ready for discharge  Short Term Goals: Ability to identify changes in lifestyle to reduce recurrence of condition will improve Ability to verbalize feelings will improve Ability to disclose and discuss suicidal ideas Ability to demonstrate self-control will improve Ability to identify and develop effective coping behaviors will improve Ability to maintain clinical measurements within normal limits will improve Compliance with prescribed medications will improve Ability to identify triggers associated with substance abuse/mental health issues will improve Ability to identify changes in lifestyle to reduce recurrence of condition will improve Ability to verbalize feelings will improve Ability to disclose and discuss suicidal ideas Ability to demonstrate self-control will improve Ability to identify and develop effective coping behaviors will improve Ability to maintain clinical measurements within normal limits will improve Compliance with prescribed medications will improve Ability to identify triggers associated with substance abuse/mental health issues will improve  Medication Management: Evaluate patient's response, side effects, and tolerance of medication regimen.  Therapeutic Interventions:  1 to 1 sessions, Unit Group sessions and Medication administration.  Evaluation of Outcomes: Progressing       Physician Treatment Plan for  Secondary Diagnosis: Principal Problem:   Bipolar I disorder, current or most recent episode manic, with psychotic features (HCC) Active Problems:   Bipolar disorder, curr episode depressed, severe, w/psychotic features (HCC)   Long Term Goal(s): Improvement in symptoms so as ready for discharge  Short Term Goals: Ability to identify changes in lifestyle to reduce recurrence of condition will improve Ability to verbalize feelings will improve Ability to disclose and discuss suicidal ideas Ability to demonstrate self-control will improve Ability to identify and develop effective coping behaviors will improve Ability to maintain clinical measurements within normal limits will improve Compliance with prescribed medications will improve Ability to identify triggers associated with substance abuse/mental health issues will improve Ability to identify changes in lifestyle to reduce recurrence of condition will improve Ability to verbalize feelings will improve Ability to disclose and discuss suicidal ideas Ability to demonstrate self-control will improve Ability to identify and develop effective coping behaviors will improve Ability to maintain clinical measurements within normal limits will improve Compliance with prescribed medications will improve Ability to identify triggers associated with substance abuse/mental health issues will improve  Medication Management: Evaluate patient's response, side effects, and tolerance of medication regimen.  Therapeutic Interventions: 1 to 1 sessions, Unit Group sessions and Medication administration.  Evaluation of Outcomes: Progressing   RN Treatment Plan for Primary Diagnosis: Bipolar I disorder, current or most recent episode manic, with psychotic features (HCC) Long Term Goal(s): Knowledge of disease and therapeutic regimen to maintain health will improve  Short Term Goals: Compliance with prescribed medications will improve  Medication  Management: RN will administer medications as ordered by provider, will assess and evaluate patient's response and provide education to patient for prescribed medication. RN will report any adverse and/or side effects to prescribing provider.  Therapeutic Interventions: 1 on 1 counseling sessions, Psychoeducation, Medication administration, Evaluate responses to treatment, Monitor vital signs and CBGs as ordered, Perform/monitor CIWA, COWS, AIMS and Fall Risk screenings as ordered, Perform wound care treatments as ordered.  Evaluation of Outcomes: Progressing   11/28:  Pt has stability of her mood and psychotic symptoms, and she has been showing improvement in her appetite but she still has poor hygiene.  She is tolerating her medications without difficulty. She is in agreement to look into group home referrals. She was refusing meds as recently as 4 days ago, but once second opinion was rendered and she was offered injection, she chose PO.  - Continue inpatient hospitalization - Bipolar I - Continueabilify 10mg  qDay  12/3:  Bipolar I - Continueabilify 10mg  qDay CSW has gotten approval for LOG by [employer] Merchand[employer], retailupervisor.  Have identified potential placement and sent information to them on pt[facility]       LCSW Treatment Plan for Primary Diagnosis: Bipolar I disorder, current or most recent episode manic, with psychotic features (HCC) Long Term Goal(s): Safe transition to appropriate next level of care at discharge, Engage patient in therapeutic group addressing interpersonal concerns.  Short Term Goals: Engage patient in aftercare planning with referrals and resources, Increase social support and Facilitate acceptance of mental health diagnosis and concerns  Therapeutic Interventions: Assess for all discharge needs, 1 to 1 time with Social worker, Explore available resources and support systems, Assess for adequacy in community support network, Educate family  and significant other(s) on suicide prevention, Complete Psychosocial Assessment, Interpersonal group  therapy.  Evaluation of Outcomes: Progressing   Progress in Treatment: Attending groups: Yes Participating in groups: No Taking medication as prescribed: Yes Toleration medication: Yes, no side effects reported at this time Family/Significant other contact made: Not yet Patient understands diagnosis: No, Limited insight Discussing patient identified problems/goals with staff: Yes Medical problems stabilized or resolved: Yes Denies suicidal/homicidal ideation: Yes Issues/concerns per patient self-inventory: None Other: N/[name]  New problem(s) identified: None identified at this time.   New Short Term/Long Term Goal(s): None identified at this time.   Discharge Plan or Barriers:CSW looking for placement with [facility] supplying LOG [possibly]  Back up plan is shelter. 12/7:  [facility] [place].  Details of LOG to worked out between [employer] [facility]  Plan to transfer Monday, but with coming weather likely will happen T or W.  Reason for Continuation of Hospitalization: Anxiety Depression Medication Stabilization  Estimated Length of Stay: 12/12  Attendees: Patient: Physician: Jolyne Loahristopher Rainville, MD        06/17/2017  8:17 AM Nursing:  Elby Beckoni Waller        06/17/2017  8:17 AM RN Care Manager:     06/17/2017  8:17 AM Social Worker: Richelle Itood Ostin Mathey           06/17/2017  8:17 AM Recreational Therapist:        06/17/2017  8:17 AM Other:      06/17/2017  8:17 AM Other:   06/17/2017  8:17 AM   Scribe for Treatment Team: Richelle Itood Jacorion Klem LCSW 06/17/2017 8:17 AM

## 2017-06-17 NOTE — Progress Notes (Signed)
[facility] MD Progress Note  06/17/2017 1:45 PM Yvonne [name]  MRN:  [id number]  Subjective: Yvonne [name] reports, "It is really not going well. I just cannot get it together. I don't want to be sleeping too much. I don't want to be feeling depressed any more. A part of me wants to lay down & sleep all the time, but, I don't want that for myself. This is not me"  Objective: Yvonne [name] is a 51 y/o F with history of Bipolar I who was admittedwith worsening symptoms of psychosis initially on IVC but later she signed in voluntarily. Prior to hospitalization, pt had been non-adherent to prescribed medications. She was in jail prior to admission and she had been refusing medical treatment/not meeting her ADL's. After admission, ptwas restarted on abilify. She had improvement of her symptoms including improved hygiene and food/liquid intake. She has been gaining weight. She is adherent to her recommended treatment regimen, but she is unable to meet her own basic needs outside the hospital and she is requiring to be placed in a group home. Today 06-17-17,  upon evaluation, pt reports she is feeling depressed. She states, "I just can't get myself together." Pt denies SI/HI/AH/VH. Pt also endorses anxiety. Much of her anxiety and depressive symptoms are related to uncertainty of her discharge location, and pt was reassured that we will continue to work with resources in the community to find her placement. Pt reports she is sleeping well and her appetite is good. She is tolerating her medications well without difficulty or side effects. She agrees to continue her current treatment plan without changes, and we will continue to research group home placement options. Staff continue to provide support & encouragement.  Principal Problem: Bipolar I disorder, current or most recent episode manic, with psychotic features (HCC) Diagnosis:   Patient Active Problem List   Diagnosis Date Noted  . Bipolar disorder, curr episode  depressed, severe, w/psychotic features (HCC) [F31.5] 06/01/2017  . Bipolar I disorder, current or most recent episode manic, with psychotic features (HCC) [F31.2]   . Bipolar affective disorder, current episode mild (HCC) [F31.9] 01/05/2017  . Manic behavior (HCC) [F30.10]   . Facial cellulitis [L03.211] 02/10/2014   Total Time spent with patient: 15 minutes  Past Psychiatric History: See H&P  Past Medical History:  Past Medical History:  Diagnosis Date  . Bipolar 1 disorder (HCC)   . Medical history non-contributory   . Mental disorder   . No pertinent past medical history     Past Surgical History:  Procedure Laterality Date  . NO PAST SURGERIES     Family History:  Family History  Problem Relation Age of Onset  . Depression Sister   . Depression Brother   . Depression Sister   . CAD Mother   . Hypertension Father   . Diabetes Father   . Bipolar disorder Cousin    Family Psychiatric  History: See H&P  Social History:  Social History   Substance and Sexual Activity  Alcohol Use No     Social History   Substance and Sexual Activity  Drug Use No    Social History   Socioeconomic History  . Marital status: Single    Spouse name: None  . Number of children: None  . Years of education: None  . Highest education level: None  Social Needs  . Financial resource strain: None  . Food insecurity - worry: None  . Food insecurity - inability: None  . Transportation needs - medical:  None  . Transportation needs - non-medical: None  Occupational History  . None  Tobacco Use  . Smoking status: Never Smoker  . Smokeless tobacco: Never Used  Substance and Sexual Activity  . Alcohol use: No  . Drug use: No  . Sexual activity: No  Other Topics Concern  . None  Social History Narrative  . None   Additional Social History:   Sleep: Good  Appetite:  Good  Current Medications: Current Facility-Administered Medications  Medication Dose Route Frequency  Provider Last Rate Last Dose  . acetaminophen (TYLENOL) tablet 650 mg  650 mg Oral Q6H PRN Rankin, Shuvon B, NP      . alum & mag hydroxide-simeth (MAALOX/MYLANTA) 200-200-20 MG/5ML suspension 30 mL  30 mL Oral Q4H PRN Rankin, Shuvon B, NP      . ARIPiprazole (ABILIFY) tablet 10 mg  10 mg Oral Daily Micheal Likensainville, [name], MD   10 mg at 06/17/17 0745  . magnesium hydroxide (MILK OF MAGNESIA) suspension 30 mL  30 mL Oral Daily PRN Rankin, Shuvon B, NP      . multivitamin with minerals tablet 1 tablet  1 tablet Oral Daily Cobos, [name], MD   1 tablet at 06/17/17 0745   Lab Results: No results found for this or any previous visit (from the past 48 hour(s)).  Blood Alcohol level:  Lab Results  Component Value Date   ETH <10 05/11/2017   ETH <10 05/07/2017   Metabolic Disorder Labs: Lab Results  Component Value Date   HGBA1C 5.5 06/21/2012   MPG 111 06/21/2012   No results found for: PROLACTIN Lab Results  Component Value Date   CHOL 166 06/21/2012   TRIG 58 06/21/2012   HDL 58 06/21/2012   CHOLHDL 2.9 06/21/2012   VLDL 12 06/21/2012   LDLCALC 96 06/21/2012   Physical Findings: AIMS: Facial and Oral Movements Muscles of Facial Expression: None, normal Lips and Perioral Area: None, normal Jaw: None, normal Tongue: None, normal,Extremity Movements Upper (arms, wrists, hands, fingers): None, normal Lower (legs, knees, ankles, toes): None, normal, Trunk Movements Neck, shoulders, hips: None, normal, Overall Severity Severity of abnormal movements (highest score from questions above): None, normal Incapacitation due to abnormal movements: None, normal Patient's awareness of abnormal movements (rate only patient's report): No Awareness, Dental Status Current problems with teeth and/or dentures?: No Does patient usually wear dentures?: No  CIWA:  CIWA-Ar Total: 1 COWS:  COWS Total Score: 1  Musculoskeletal: Strength & Muscle Tone: within normal limits Gait & Station:  normal Patient leans: N/A  Psychiatric Specialty Exam: Physical Exam  Nursing note and vitals reviewed.   Review of Systems  Constitutional: Negative for chills and fever.  Respiratory: Negative for cough.   Cardiovascular: Negative for chest pain.  Gastrointestinal: Negative for heartburn and nausea.  Psychiatric/Behavioral: Positive for depression. Negative for hallucinations and suicidal ideas. The patient is nervous/anxious.     Blood pressure 112/77, pulse (!) 133, temperature 98.7 F (37.1 C), temperature source Oral, resp. rate 16, height 5\' 2"  (1.575 m), weight 43.5 kg (96 lb).Body mass index is 17.56 kg/m.  General Appearance: Casual and Disheveled  Eye Contact:  Fair  Speech:  Clear and Coherent and Slow  Volume:  Decreased  Mood:  Anxious and Depressed  Affect:  Congruent, Constricted and Depressed  Thought Process:  Coherent and Goal Directed  Orientation:  Full (Time, Place, and Person)  Thought Content:  Logical  Suicidal Thoughts:  No  Homicidal Thoughts:  No  Memory:  Immediate;   Fair Recent;   Fair Remote;   Fair  Judgement:  Fair  Insight:  Fair  Psychomotor Activity:  Normal  Concentration:  Concentration: Fair  Recall:  [employer]  Fund of Knowledge:  Fair  Language:  Fair  Akathisia:  No  Handed:    AIMS (if indicated):     Assets:  Communication Skills Leisure Time Physical Health Resilience Social Support  ADL's:  Intact  Cognition:  WNL  Sleep:  Number of Hours: 6   Treatment Plan Summary: Daily contact with patient to assess and evaluate symptoms and progress in treatment and Medication management. Pt has some  stability of her presenting symptoms, and she is tolerating her current medication regimen well. She has some anxiety and depression about uncertainty of her discharge location, but pt was able to be reassured that we are continuing to work on her placement and [facility] secured.  -  Continue inpatient hospitalization.  Will continue today 06/17/2017 plan as below except where it is noted.  - Bipolar I - Continueabilify 10mg  qDay  - Encourage participation in groups and the therapeutic milieu - Discharge planning will be ongoing  Sanjuana KavaNwoko,  I, NP, PMHNP, FNP-BC 06/17/2017, 1:45 PMPatient ID: Gypsy BalsamMelvine Barra, female   DOB: 08/10/1965, 51 y.o.   MRN: [id number]

## 2017-06-17 NOTE — Progress Notes (Signed)
D.  Pt in bed on approach, resting with eyes closed.  Respirations are even and unlabored.  No distress noted.  A.  Will continue to monitor.  R.  Pt remains safe on the unit

## 2017-06-18 DIAGNOSIS — R251 Tremor, unspecified: Secondary | ICD-10-CM

## 2017-06-18 MED ORDER — BENZTROPINE MESYLATE 0.5 MG PO TABS
0.5000 mg | ORAL_TABLET | Freq: Every day | ORAL | Status: DC
  Administered 2017-06-18 – 2017-06-22 (×5): 0.5 mg via ORAL
  Filled 2017-06-18 (×4): qty 1
  Filled 2017-06-18 (×2): qty 30
  Filled 2017-06-18 (×3): qty 1

## 2017-06-18 NOTE — [facility] Group Notes (Signed)
[facility] Group Notes: (Clinical Social Work)   06/18/2017      Type of Therapy:  Group Therapy   Participation Level:  Did Not Attend - came into the room twice, did not want to speak, then left   Ambrose MantleMareida Grossman-Orr, LCSW 06/18/2017, 12:31 PM

## 2017-06-18 NOTE — Progress Notes (Signed)
D. Pt presents with a flat affect and depressed behavior. Pt observed sitting in dayroom - no interaction with peers- Pt states to this writer, " I want this depression to go. It just won't leave". Pt currently denies SI/HI and AVH and agrees to contact staff before acting on any harmful thoughts.  A. Labs and vitals monitored. Pt given and educated on medications. Pt supported emotionally and encouraged to express voice feelings. R. Pt remains safe with 15 minute checks. Will continue POC.

## 2017-06-18 NOTE — Progress Notes (Signed)
[facility] MD Progress [facility]  06/18/2017 12:01 PM Gypsy BalsamMelvine Parodi  MRN:  [id number]  Subjective: Yvonne [name] reports " This is not me, I am feeling depressed today."  Objective: [name] is awake and alert. Present flat,guarded and solument affect. reports she is not feeling her self for the past "few days." reports taken and tolerating medications well. Noted that patient well be transported to a group home.  Denies suicidal or homicidal ideation. Denies auditory or visual hallucination and does not appear to be responding to internal stimuli. Patient present with some irritability and dejected throughout this assessment. Reports low energy and continues to isolate. Discussed initiating  cogentin or benadryl for EPS patient was aggrable. . Support, encouragement and reassurance was provided.   Principal Problem: Bipolar I disorder, current or most recent episode manic, with psychotic features (HCC) Diagnosis:   Patient Active Problem List   Diagnosis Date Noted  . Bipolar disorder, curr episode depressed, severe, w/psychotic features (HCC) [F31.5] 06/01/2017  . Bipolar I disorder, current or most recent episode manic, with psychotic features (HCC) [F31.2]   . Bipolar affective disorder, current episode mild (HCC) [F31.9] 01/05/2017  . Manic behavior (HCC) [F30.10]   . Facial cellulitis [L03.211] 02/10/2014   Total Time spent with patient: 15 minutes  Past Psychiatric History: See H&P  Past Medical History:  Past Medical History:  Diagnosis Date  . Bipolar 1 disorder (HCC)   . Medical history non-contributory   . Mental disorder   . No pertinent past medical history     Past Surgical History:  Procedure Laterality Date  . NO PAST SURGERIES     Family History:  Family History  Problem Relation Age of Onset  . Depression Sister   . Depression Brother   . Depression Sister   . CAD Mother   . Hypertension Father   . Diabetes Father   . Bipolar disorder Cousin    Family Psychiatric   History: See H&P  Social History:  Social History   Substance and Sexual Activity  Alcohol Use No     Social History   Substance and Sexual Activity  Drug Use No    Social History   Socioeconomic History  . Marital status: Single    Spouse name: None  . Number of children: None  . Years of education: None  . Highest education level: None  Social Needs  . Financial resource strain: None  . Food insecurity - worry: None  . Food insecurity - inability: None  . Transportation needs - medical: None  . Transportation needs - non-medical: None  Occupational History  . None  Tobacco Use  . Smoking status: Never Smoker  . Smokeless tobacco: Never Used  Substance and Sexual Activity  . Alcohol use: No  . Drug use: No  . Sexual activity: No  Other Topics Concern  . None  Social History Narrative  . None   Additional Social History:   Sleep: Good  Appetite:  Good  Current Medications: Current Facility-Administered Medications  Medication Dose Route Frequency Provider Last Rate Last Dose  . acetaminophen (TYLENOL) tablet 650 mg  650 mg Oral Q6H PRN Rankin, Shuvon B, NP      . alum & mag hydroxide-simeth (MAALOX/MYLANTA) 200-200-20 MG/5ML suspension 30 mL  30 mL Oral Q4H PRN Rankin, Shuvon B, NP      . ARIPiprazole (ABILIFY) tablet 10 mg  10 mg Oral Daily Micheal Likensainville, [name], MD   10 mg at 06/18/17 0756  . benztropine (COGENTIN)  tablet 0.5 mg  0.5 mg Oral Daily Oneta Rack, NP   0.5 mg at 06/18/17 1059  . magnesium hydroxide (MILK OF MAGNESIA) suspension 30 mL  30 mL Oral Daily PRN Rankin, Shuvon B, NP      . multivitamin with minerals tablet 1 tablet  1 tablet Oral Daily Cobos, Rockey Situ, MD   1 tablet at 06/18/17 1610   Lab Results: No results found for this or any previous visit (from the past 48 hour(s)).  Blood Alcohol level:  Lab Results  Component Value Date   ETH <10 05/11/2017   ETH <10 05/07/2017   Metabolic Disorder Labs: Lab Results   Component Value Date   HGBA1C 5.5 06/21/2012   MPG 111 06/21/2012   No results found for: PROLACTIN Lab Results  Component Value Date   CHOL 166 06/21/2012   TRIG 58 06/21/2012   HDL 58 06/21/2012   CHOLHDL 2.9 06/21/2012   VLDL 12 06/21/2012   LDLCALC 96 06/21/2012   Physical Findings: AIMS: Facial and Oral Movements Muscles of Facial Expression: None, normal Lips and Perioral Area: None, normal Jaw: None, normal Tongue: None, normal,Extremity Movements Upper (arms, wrists, hands, fingers): None, normal Lower (legs, knees, ankles, toes): None, normal, Trunk Movements Neck, shoulders, hips: None, normal, Overall Severity Severity of abnormal movements (highest score from questions above): None, normal Incapacitation due to abnormal movements: None, normal Patient's awareness of abnormal movements (rate only patient's report): No Awareness, Dental Status Current problems with teeth and/or dentures?: No Does patient usually wear dentures?: No  CIWA:  CIWA-Ar Total: 1 COWS:  COWS Total Score: 1  Musculoskeletal: Strength & Muscle Tone: within normal limits Gait & Station: normal Patient leans: N/A  Psychiatric Specialty Exam: Physical Exam  Nursing note and vitals reviewed. Neurological: She is alert.  Psychiatric: She has a normal mood and affect.    Review of Systems  Neurological: Positive for tremors (slight).  Psychiatric/Behavioral: Positive for depression. Negative for hallucinations and suicidal ideas. The patient is nervous/anxious.     Blood pressure 107/73, pulse (!) 127, temperature 98.5 F (36.9 C), temperature source Oral, resp. rate 16, height 5\' 2"  (1.575 m), weight 43.5 kg (96 lb).Body mass index is 17.56 kg/m.  General Appearance: Casual and Disheveled  Eye Contact:  Fair  Speech:  Clear and Coherent and Slow  Volume:  Decreased  Mood:  Anxious and Depressed  Affect:  Congruent, Constricted and Depressed  Thought Process:  Coherent and Goal  Directed  Orientation:  Full (Time, Place, and Person)  Thought Content:  Logical  Suicidal Thoughts:  No  Homicidal Thoughts:  No  Memory:  Immediate;   Fair Recent;   Fair Remote;   Fair  Judgement:  Fair  Insight:  Fair  Psychomotor Activity:  Normal  Concentration:  Concentration: Fair  Recall:  [employer] of Knowledge:  Fair  Language:  Fair  Akathisia:  No  Handed:    AIMS (if indicated):     Assets:  Communication Skills Leisure Time Physical Health Resilience Social Support  ADL's:  Intact  Cognition:  WNL  Sleep:  Number of Hours: 6.75   Treatment Plan Summary: Daily contact with patient to assess and evaluate symptoms and progress in treatment and Medication management.   Will continue today 06/18/2017 plan as below except where it is noted.  - Bipolar I - Continueabilify 10mg  Q Day -ESP     Initiate cogentin 0.5mg  QD   - Encourage participation in groups and  the therapeutic milieu - Discharge planning will be ongoing  Oneta Rackanika N , NP 06/18/2017, 12:01 PM

## 2017-06-19 NOTE — Plan of Care (Signed)
  Safety: Periods of time without injury will increase 06/19/2017 2233 by Delos HaringPhillips, Matt Delpizzo A, RN Note Pt safe on the unit at this time

## 2017-06-19 NOTE — Progress Notes (Signed)
D. Pt presents with a flat affect and depressed, almost catatonic behavior. Pt sitting in dayroom upon initial approach, staring straight ahead. Pt remains flat upon interaction, stating, "when is this depression going to leave, it just won't leave." Pt's heart rate elevated-Pt given pitcher of water and encouraged to drink.  Pt currently denies SI/HI and AVH or pain.  A. Labs and vitals monitored. Pt compliant with medications. Emotional support given to pt 1:1  R. Pt remains safe with 15 minute checks. Will continue POC.

## 2017-06-19 NOTE — Progress Notes (Signed)
[facility] MD Progress Note  06/19/2017 12:06 PM Yvonne [name]  MRN:  [id number]  Subjective: Yvonne [name] reports "how can I stop feeling this way? I can help myself and no one with take me looking like this."   Objective: Rameen Ballow seen pacing the unit. Patient is flat, blunted and solument. Patient continue to ruminate with her current depressed state. Noted that patient appetite is low and patient continues to refuse ensure.   Denies suicidal or homicidal ideations. Denies auditory or visual hallucination and does not appear to be responding to internal stimuli. Patient reports she doesn't feel the Abilify is helping with my symptoms. Patient continues to present with some irritability and dejected throughout this assessment.  Hygiene was encouraged.  Will consider rehydrations with IV fluids. Support, encouragement and reassurance was provided.   Principal Problem: Bipolar I disorder, current or most recent episode manic, with psychotic features (HCC) Diagnosis:   Patient Active Problem List   Diagnosis Date Noted  . Bipolar disorder, curr episode depressed, severe, w/psychotic features (HCC) [F31.5] 06/01/2017  . Bipolar I disorder, current or most recent episode manic, with psychotic features (HCC) [F31.2]   . Bipolar affective disorder, current episode mild (HCC) [F31.9] 01/05/2017  . Manic behavior (HCC) [F30.10]   . Facial cellulitis [L03.211] 02/10/2014   Total Time spent with patient: 15 minutes  Past Psychiatric History: See H&P  Past Medical History:  Past Medical History:  Diagnosis Date  . Bipolar 1 disorder (HCC)   . Medical history non-contributory   . Mental disorder   . No pertinent past medical history     Past Surgical History:  Procedure Laterality Date  . NO PAST SURGERIES     Family History:  Family History  Problem Relation Age of Onset  . Depression Sister   . Depression Brother   . Depression Sister   . CAD Mother   . Hypertension Father   . Diabetes  Father   . Bipolar disorder Cousin    Family Psychiatric  History: See H&P  Social History:  Social History   Substance and Sexual Activity  Alcohol Use No     Social History   Substance and Sexual Activity  Drug Use No    Social History   Socioeconomic History  . Marital status: Single    Spouse name: None  . Number of children: None  . Years of education: None  . Highest education level: None  Social Needs  . Financial resource strain: None  . Food insecurity - worry: None  . Food insecurity - inability: None  . Transportation needs - medical: None  . Transportation needs - non-medical: None  Occupational History  . None  Tobacco Use  . Smoking status: Never Smoker  . Smokeless tobacco: Never Used  Substance and Sexual Activity  . Alcohol use: No  . Drug use: No  . Sexual activity: No  Other Topics Concern  . None  Social History Narrative  . None   Additional Social History:   Sleep: Good  Appetite:  Good  Current Medications: Current Facility-Administered Medications  Medication Dose Route Frequency Provider Last Rate Last Dose  . acetaminophen (TYLENOL) tablet 650 mg  650 mg Oral Q6H PRN Rankin, Shuvon B, NP      . alum & mag hydroxide-simeth (MAALOX/MYLANTA) 200-200-20 MG/5ML suspension 30 mL  30 mL Oral Q4H PRN Rankin, Shuvon B, NP      . ARIPiprazole (ABILIFY) tablet 10 mg  10 mg Oral Daily Micheal Likens,  MD   10 mg at 06/19/17 0726  . benztropine (COGENTIN) tablet 0.5 mg  0.5 mg Oral Daily Oneta RackLewis, Tanika N, NP   0.5 mg at 06/19/17 0726  . magnesium hydroxide (MILK OF MAGNESIA) suspension 30 mL  30 mL Oral Daily PRN Rankin, Shuvon B, NP      . multivitamin with minerals tablet 1 tablet  1 tablet Oral Daily Cobos, [name], MD   1 tablet at 06/19/17 [id number]   Lab Results: No results found for this or any previous visit (from the past 48 hour(s)).  Blood Alcohol level:  Lab Results  Component Value Date   ETH <10 05/11/2017   ETH <10  05/07/2017   Metabolic Disorder Labs: Lab Results  Component Value Date   HGBA1C 5.5 06/21/2012   MPG 111 06/21/2012   No results found for: PROLACTIN Lab Results  Component Value Date   CHOL 166 06/21/2012   TRIG 58 06/21/2012   HDL 58 06/21/2012   CHOLHDL 2.9 06/21/2012   VLDL 12 06/21/2012   LDLCALC 96 06/21/2012   Physical Findings: AIMS: Facial and Oral Movements Muscles of Facial Expression: None, normal Lips and Perioral Area: None, normal Jaw: None, normal Tongue: None, normal,Extremity Movements Upper (arms, wrists, hands, fingers): None, normal Lower (legs, knees, ankles, toes): None, normal, Trunk Movements Neck, shoulders, hips: None, normal, Overall Severity Severity of abnormal movements (highest score from questions above): None, normal Incapacitation due to abnormal movements: None, normal Patient's awareness of abnormal movements (rate only patient's report): No Awareness, Dental Status Current problems with teeth and/or dentures?: No Does patient usually wear dentures?: No  CIWA:  CIWA-Ar Total: 1 COWS:  COWS Total Score: 1  Musculoskeletal: Strength & Muscle Tone: within normal limits Gait & Station: shuffle Patient leans: N/A  Psychiatric Specialty Exam: Physical Exam  Nursing note and vitals reviewed. Constitutional: She appears well-developed.  Neurological: She is alert.  Psychiatric: She has a normal mood and affect.    Review of Systems  Neurological: Positive for tremors (slight).  Psychiatric/Behavioral: Positive for depression. Negative for hallucinations and suicidal ideas. The patient is nervous/anxious.   All other systems reviewed and are negative.   Blood pressure 121/75, pulse 99, temperature 98 F (36.7 C), resp. rate 16, height 5\' 2"  (1.575 m), weight 43.5 kg (96 lb).Body mass index is 17.56 kg/m.  General Appearance: Casual and Disheveled  Eye Contact:  Fair  Speech:  Clear and Coherent and Slow  Volume:  Decreased  Mood:   Anxious and Depressed  Affect:  Congruent, Constricted and Depressed  Thought Process:  Coherent and Goal Directed  Orientation:  Full (Time, Place, and Person)  Thought Content:  Logical  Suicidal Thoughts:  No  Homicidal Thoughts:  No  Memory:  Immediate;   Fair Recent;   Fair Remote;   Fair  Judgement:  Fair  Insight:  Fair  Psychomotor Activity:  Normal  Concentration:  Concentration: Fair  Recall:  [employer]Fair  Fund of Knowledge:  Fair  Language:  Fair  Akathisia:  No  Handed:    AIMS (if indicated):     Assets:  Communication Skills Leisure Time Physical Health Resilience Social Support  ADL's:  Intact  Cognition:  WNL  Sleep:  Number of Hours: 6.25   Treatment Plan Summary: Daily contact with patient to assess and evaluate symptoms and progress in treatment and Medication management.   Will continue today 06/19/2017 plan as below except where it is noted.  - Bipolar I - ContinueAbilify  10mg  Q Day -ESP     - continue cogentin 0.5mg  QD   - Encourage participation in groups and the therapeutic milieu - Discharge planning will be ongoing  Oneta Rackanika N Lewis, NP 06/19/2017, 12:06 PM

## 2017-06-19 NOTE — [facility] Group Notes (Signed)
[facility] LCSW Group Therapy Note  Date/Time:  06/19/2017  11:00AM-12:00PM  Type of Therapy and Topic:  Group Therapy:  Music and Mood  Participation Level:  Minimal   Description of Group: In this process group, members listened to a variety of genres of music and identified that different types of music evoke different responses.  Patients were encouraged to identify music that was soothing for them and music that was energizing for them.  Patients discussed how this knowledge can help with wellness and recovery in various ways including managing depression and anxiety as well as encouraging healthy sleep habits.    Therapeutic Goals: 1. Patients will explore the impact of different varieties of music on mood 2. Patients will verbalize the thoughts they have when listening to different types of music 3. Patients will identify music that is soothing to them as well as music that is energizing to them 4. Patients will discuss how to use this knowledge to assist in maintaining wellness and recovery 5. Patients will explore the use of music as a coping skill  Summary of Patient Progress:  At the beginning of group, patient expressed she was feeling very depressed.  She was in and out of the room multiple times, completely flat affect.  When asked how various songs made her feel, she did not respond.  Therapeutic Modalities: Solution Focused Brief Therapy Motivational Interviewing Activity   Ambrose MantleMareida Grossman-Orr, LCSW 06/19/2017 11:54 AM

## 2017-06-19 NOTE — Progress Notes (Signed)
D: Pt stayed in her room this evening  A: Pt was offered support and encouragement.  Pt was encourage to attend groups. Q 15 minute checks were done for safety.   R: safety maintained on unit.

## 2017-06-20 MED ORDER — ARIPIPRAZOLE 15 MG PO TABS
15.0000 mg | ORAL_TABLET | Freq: Every day | ORAL | Status: DC
  Administered 2017-06-21 – 2017-06-22 (×2): 15 mg via ORAL
  Filled 2017-06-20: qty 1
  Filled 2017-06-20 (×2): qty 30
  Filled 2017-06-20 (×2): qty 1

## 2017-06-20 NOTE — Progress Notes (Signed)
[facility]  06/20/2017 12:11 PM Yvonne BalsamMelvine [name]  MRN:  [id number]  Objective: Yvonne [name] continues to reports depression and sadness. States she doesn't  feel ready to discharge with " all this depression." continues to present  flat, blunted and solument. Rates he depression 10/10 and states she is medication compliant. Discussed increasing Abilify  for mood stabilization patient was agreeable.  Patient continues to need on going encouragement for daily ALD's and hygiene. Support, encouragement and reassurance was provided.   Principal Problem: Bipolar I disorder, current or most recent episode manic, with psychotic features (HCC) Diagnosis:   Patient Active Problem List   Diagnosis Date Noted  . Bipolar disorder, curr episode depressed, severe, w/psychotic features (HCC) [F31.5] 06/01/2017  . Bipolar I disorder, current or most recent episode manic, with psychotic features (HCC) [F31.2]   . Bipolar affective disorder, current episode mild (HCC) [F31.9] 01/05/2017  . Manic behavior (HCC) [F30.10]   . Facial cellulitis [L03.211] 02/10/2014   Total Time spent with patient: 15 minutes  Past Psychiatric History: See H&P  Past Medical History:  Past Medical History:  Diagnosis Date  . Bipolar 1 disorder (HCC)   . Medical history non-contributory   . Mental disorder   . No pertinent past medical history     Past Surgical History:  Procedure Laterality Date  . NO PAST SURGERIES     Family History:  Family History  Problem Relation Age of Onset  . Depression Sister   . Depression Brother   . Depression Sister   . CAD Mother   . Hypertension Father   . Diabetes Father   . Bipolar disorder Cousin    Family Psychiatric  History: See H&P  Social History:  Social History   Substance and Sexual Activity  Alcohol Use No     Social History   Substance and Sexual Activity  Drug Use No    Social History   Socioeconomic History  . Marital status: Single     Spouse name: None  . Number of children: None  . Years of education: None  . Highest education level: None  Social Needs  . Financial resource strain: None  . Food insecurity - worry: None  . Food insecurity - inability: None  . Transportation needs - medical: None  . Transportation needs - non-medical: None  Occupational History  . None  Tobacco Use  . Smoking status: Never Smoker  . Smokeless tobacco: Never Used  Substance and Sexual Activity  . Alcohol use: No  . Drug use: No  . Sexual activity: No  Other Topics Concern  . None  Social History Narrative  . None   Additional Social History:   Sleep: Good  Appetite:  Good  Current Medications: Current Facility-Administered Medications  Medication Dose Route Frequency Provider Last Rate Last Dose  . acetaminophen (TYLENOL) tablet 650 mg  650 mg Oral Q6H PRN Rankin, Shuvon B, NP      . alum & mag hydroxide-simeth (MAALOX/MYLANTA) 200-200-20 MG/5ML suspension 30 mL  30 mL Oral Q4H PRN Rankin, Shuvon B, NP      . [START ON 06/21/2017] ARIPiprazole (ABILIFY) tablet 15 mg  15 mg Oral Daily Oneta RackLewis, Yanelli Zapanta N, NP      . benztropine (COGENTIN) tablet 0.5 mg  0.5 mg Oral Daily Oneta RackLewis, Iyanna Drummer N, NP   0.5 mg at 06/20/17 0819  . magnesium hydroxide (MILK OF MAGNESIA) suspension 30 mL  30 mL Oral Daily PRN Rankin, Shuvon B, NP      .  multivitamin with minerals tablet 1 tablet  1 tablet Oral Daily Cobos, [name], MD   1 tablet at 06/20/17 [id number]   Lab Results: No results found for this or any previous visit (from the past 48 hour(s)).  Blood Alcohol level:  Lab Results  Component Value Date   ETH <10 05/11/2017   ETH <10 05/07/2017   Metabolic Disorder Labs: Lab Results  Component Value Date   HGBA1C 5.5 06/21/2012   MPG 111 06/21/2012   No results found for: PROLACTIN Lab Results  Component Value Date   CHOL 166 06/21/2012   TRIG 58 06/21/2012   HDL 58 06/21/2012   CHOLHDL 2.9 06/21/2012   VLDL 12 06/21/2012   LDLCALC  96 06/21/2012   Physical Findings: AIMS: Facial and Oral Movements Muscles of Facial Expression: None, normal Lips and Perioral Area: None, normal Jaw: None, normal Tongue: None, normal,Extremity Movements Upper (arms, wrists, hands, fingers): None, normal Lower (legs, knees, ankles, toes): None, normal, Trunk Movements Neck, shoulders, hips: None, normal, Overall Severity Severity of abnormal movements (highest score from questions above): None, normal Incapacitation due to abnormal movements: None, normal Patient's awareness of abnormal movements (rate only patient's report): No Awareness, Dental Status Current problems with teeth and/or dentures?: No Does patient usually wear dentures?: No  CIWA:  CIWA-Ar Total: 1 COWS:  COWS Total Score: 1  Musculoskeletal: Strength & Muscle Tone: within normal limits Gait & Station: shuffle Patient leans: N/A  Psychiatric Specialty Exam: Physical Exam  Nursing note and vitals reviewed. Constitutional: She appears well-developed.  Neurological: She is alert.  Psychiatric: She has a normal mood and affect.    Review of Systems  Neurological: Positive for tremors (slight and improvement noted this assessment ).  Psychiatric/Behavioral: Positive for depression. Negative for hallucinations and suicidal ideas. The patient is nervous/anxious.   All other systems reviewed and are negative.   Blood pressure 108/80, pulse (!) 139, temperature 99.1 F (37.3 C), resp. rate 18, height 5\' 2"  (1.575 m), weight 43.5 kg (96 lb).Body mass index is 17.56 kg/m.  General Appearance: Casual and Disheveled  Eye Contact:  Fair  Speech:  Clear and Coherent and Slow  Volume:  Decreased  Mood:  Anxious and Depressed  Affect:  Congruent, Constricted and Depressed  Thought Process:  Coherent and Goal Directed  Orientation:  Full (Time, Place, and Person)  Thought Content:  Logical  Suicidal Thoughts:  No  Homicidal Thoughts:  No  Memory:  Immediate;    Fair Recent;   Fair Remote;   Fair  Judgement:  Fair  Insight:  Fair  Psychomotor Activity:  Normal  Concentration:  Concentration: Fair  Recall:  [employer]Fair  Fund of Knowledge:  Fair  Language:  Fair  Akathisia:  No  Handed:    AIMS (if indicated):     Assets:  Communication Skills Leisure Time Physical Health Resilience Social Support  ADL's:  Intact  Cognition:  WNL  Sleep:  Number of Hours: 5.75   Treatment Plan Summary: Daily contact with patient to assess and evaluate symptoms and progress in treatment and Medication management.   Will continue today 06/20/2017 plan as below except where it is noted.  - Bipolar I - Increased Abilify 10 mg to 15 mg  Q Day -ESP     - continue cogentin 0.5mg  QD   - Encourage participation in groups and the therapeutic milieu - Discharge planning will be ongoing  Oneta Rackanika N Naveed Humphres, NP 06/20/2017, 12:11 PM

## 2017-06-20 NOTE — Plan of Care (Signed)
Nurse discussed depression/anxiety/coping skills with patient. 

## 2017-06-20 NOTE — Progress Notes (Signed)
D:  Patient stated "I am very depressed."  Patient denied SI and HI, contracts for safety.  Denied A/V hallucinations. A:   Medications administered per MD orders.  Emotional support and encouragement given patient. R:  Safety maintained with 15 minute checks. Patient stayed in bed most of the day.

## 2017-06-21 MED ORDER — LORAZEPAM 0.5 MG PO TABS
0.5000 mg | ORAL_TABLET | Freq: Four times a day (QID) | ORAL | Status: DC | PRN
  Administered 2017-06-21 – 2017-06-22 (×2): 0.5 mg via ORAL
  Filled 2017-06-21 (×2): qty 1

## 2017-06-21 NOTE — Progress Notes (Signed)
[facility]  06/21/2017 12:29 PM Yvonne [name]  MRN:  [id number] Subjective:    Yvonne [name] is a 51 y/o F with history of Bipolar I who was admittedwithworsening symptoms of psychosis initially on IVC but later she signed in voluntarily. Prior to hospitalization, pt had poor adherence to prescribed medications. She was in jail prior to admission and she had been refusing medical treatment/not meeting her ADL's. After admission, ptwas restarted on abilify. She had improvement of her symptoms including improved hygiene and food/liquid intake. She has been gaining weight. Pt had achieved stability of her mood symptoms in the hospital, and she was adherent to her recommended treatment regimen. However, she was determined to be unable to meet her own basic needs outside the hospital and she is requires placement in a group home. Pt has been accepted to a group home after interview, and her anticipated discharge to group home is tomorrow 06/22/17.  Today upon evaluation, pt reports she is still struggling with depression. She explains, "I'm trying to fight this depression." Pt does not have a specific trigger or concern which is worsening her depression, but she does express that sometimes she feels as if "I will never get out of here." Pt was reassured that the plan is to discharge to a group home, and she verbalized agreement and good understanding. She denies SI/HI/AH/VH. She is sleeping adequately. Her appetite is fair. Pt appears anxious and tearful at time of evaluation, and she was offered to be started on PRN medication to address her current symptoms of anxiety and depression. Pt was in agreement to attempt trial of PRN ativan to address her current anxiety. She agrees to continue other medication of abilify without changes. Discussed with patient that we will meet with her again tomorrow to verify that she feels she can be safe to discharge to group home, and pt verbalized good  understanding. She had no further questions, comments, or concerns.   Principal Problem: Bipolar I disorder, current or most recent episode manic, with psychotic features (HCC) Diagnosis:   Patient Active Problem List   Diagnosis Date Noted  . Bipolar disorder, curr episode depressed, severe, w/psychotic features (HCC) [F31.5] 06/01/2017  . Bipolar I disorder, current or most recent episode manic, with psychotic features (HCC) [F31.2]   . Bipolar affective disorder, current episode mild (HCC) [F31.9] 01/05/2017  . Manic behavior (HCC) [F30.10]   . Facial cellulitis [L03.211] 02/10/2014   Total Time spent with patient: 30 minutes  Past Psychiatric History: see H&P  Past Medical History:  Past Medical History:  Diagnosis Date  . Bipolar 1 disorder (HCC)   . Medical history non-contributory   . Mental disorder   . No pertinent past medical history     Past Surgical History:  Procedure Laterality Date  . NO PAST SURGERIES     Family History:  Family History  Problem Relation Age of Onset  . Depression Sister   . Depression Brother   . Depression Sister   . CAD Mother   . Hypertension Father   . Diabetes Father   . Bipolar disorder Cousin    Family Psychiatric  History: see H&P Social History:  Social History   Substance and Sexual Activity  Alcohol Use No     Social History   Substance and Sexual Activity  Drug Use No    Social History   Socioeconomic History  . Marital status: Single    Spouse name: None  . Number of children: None  .  Years of education: None  . Highest education level: None  Social Needs  . Financial resource strain: None  . Food insecurity - worry: None  . Food insecurity - inability: None  . Transportation needs - medical: None  . Transportation needs - non-medical: None  Occupational History  . None  Tobacco Use  . Smoking status: Never Smoker  . Smokeless tobacco: Never Used  Substance and Sexual Activity  . Alcohol use: No   . Drug use: No  . Sexual activity: No  Other Topics Concern  . None  Social History Narrative  . None   Additional Social History:                         Sleep: Fair  Appetite:  Fair  Current Medications: Current Facility-Administered Medications  Medication Dose Route Frequency Provider Last Rate Last Dose  . acetaminophen (TYLENOL) tablet 650 mg  650 mg Oral Q6H PRN Rankin, Shuvon B, NP      . alum & mag hydroxide-simeth (MAALOX/MYLANTA) 200-200-20 MG/5ML suspension 30 mL  30 mL Oral Q4H PRN Rankin, Shuvon B, NP      . ARIPiprazole (ABILIFY) tablet 15 mg  15 mg Oral Daily Oneta RackLewis, Tanika N, NP   15 mg at 06/21/17 0800  . benztropine (COGENTIN) tablet 0.5 mg  0.5 mg Oral Daily Oneta RackLewis, Tanika N, NP   0.5 mg at 06/21/17 0800  . LORazepam (ATIVAN) tablet 0.5 mg  0.5 mg Oral Q6H PRN Micheal Likensainville, Jowell Bossi T, MD   0.5 mg at 06/21/17 1009  . magnesium hydroxide (MILK OF MAGNESIA) suspension 30 mL  30 mL Oral Daily PRN Rankin, Shuvon B, NP      . multivitamin with minerals tablet 1 tablet  1 tablet Oral Daily Cobos, [name], MD   1 tablet at 06/21/17 0800    Lab Results: No results found for this or any previous visit (from the past 48 hour(s)).  Blood Alcohol level:  Lab Results  Component Value Date   ETH <10 05/11/2017   ETH <10 05/07/2017    Metabolic Disorder Labs: Lab Results  Component Value Date   HGBA1C 5.5 06/21/2012   MPG 111 06/21/2012   No results found for: PROLACTIN Lab Results  Component Value Date   CHOL 166 06/21/2012   TRIG 58 06/21/2012   HDL 58 06/21/2012   CHOLHDL 2.9 06/21/2012   VLDL 12 06/21/2012   LDLCALC 96 06/21/2012    Physical Findings: AIMS: Facial and Oral Movements Muscles of Facial Expression: None, normal Lips and Perioral Area: None, normal Jaw: None, normal Tongue: None, normal,Extremity Movements Upper (arms, wrists, hands, fingers): None, normal Lower (legs, knees, ankles, toes): None, normal, Trunk  Movements Neck, shoulders, hips: None, normal, Overall Severity Severity of abnormal movements (highest score from questions above): None, normal Incapacitation due to abnormal movements: None, normal Patient's awareness of abnormal movements (rate only patient's report): No Awareness, Dental Status Current problems with teeth and/or dentures?: No Does patient usually wear dentures?: No  CIWA:  CIWA-Ar Total: 1 COWS:  COWS Total Score: 3  Musculoskeletal: Strength & Muscle Tone: within normal limits Gait & Station: normal Patient leans: N/A  Psychiatric Specialty Exam: Physical Exam  Nursing note and vitals reviewed.   Review of Systems  Constitutional: Negative for chills and fever.  Respiratory: Negative for cough and shortness of breath.   Gastrointestinal: Negative for abdominal pain, heartburn, nausea and vomiting.  Psychiatric/Behavioral: Positive for depression. Negative for  hallucinations and suicidal ideas. The patient is nervous/anxious.     Blood pressure 123/81, pulse (!) 120, temperature 98.3 F (36.8 C), temperature source Oral, resp. rate 16, height 5\' 2"  (1.575 m), weight 43.5 kg (96 lb).Body mass index is 17.56 kg/m.  General Appearance: Disheveled  Eye Contact:  Good  Speech:  Slow  Volume:  Decreased  Mood:  Anxious and Depressed  Affect:  Appropriate and Congruent  Thought Process:  Coherent and Goal Directed  Orientation:  Full (Time, Place, and Person)  Thought Content:  Logical  Suicidal Thoughts:  No  Homicidal Thoughts:  No  Memory:  Immediate;   Good Recent;   Good Remote;   Good  Judgement:  Fair  Insight:  Fair  Psychomotor Activity:  Normal  Concentration:  Concentration: Good  Recall:  Fair  Fund of Knowledge:  Fair  Language:  Fair  Akathisia:  No  Handed:    AIMS (if indicated):     Assets:  Communication Skills Leisure Time Physical Health Resilience  ADL's:  Intact  Cognition:  WNL  Sleep:  Number of Hours: 6.75      Treatment Plan Summary: Daily contact with patient to assess and evaluate symptoms and progress in treatment and Medication management. Pt has overall improvement of her mood symptoms as compared to initial presentation, but she reports some ongoing depression and anxiety today. She continues to be in agreement to discharge to group home tomorrow and there are no imminent safety concerns at this time. She is in agreement to attempt trial of PRN ativan to address her current anxiety and depression symptoms.   - Continue inpatient hospitalization.  - Bipolar I - Continueabilify 10mg  qDay  -Anxiety         - Start Ativan 0.5mg  q6h prn anxiety  - Encourage participation in groups and the therapeutic milieu - Discharge planning will be ongoing      Micheal Likens, MD 06/21/2017, 12:29 PM

## 2017-06-21 NOTE — Progress Notes (Signed)
D: Pt was in bed in her room upon initial approach.  Pt presents with depressed affect and mood.  Her speech is slow and her eye contact is brief.  She forwards little information to Clinical research associatewriter.  Her goal is "trying to find a way to go to sleep and this depression."  When asked for clarification, pt would not elaborate.  Pt denies SI/HI, denies hallucinations, denies pain.  Pt been isolative to her room for the majority of the night and she did not attend evening group.    A: Introduced self to pt.  Actively listened to pt and offered support and encouragement.  Encouraged pt to attend group.  Q15 minute safety checks maintained.  R: Pt is safe on the unit.  Pt verbally contracts for safety.  Will continue to monitor and assess.

## 2017-06-21 NOTE — Progress Notes (Signed)
Did not attend group 

## 2017-06-21 NOTE — Progress Notes (Signed)
DAR NOTE: Patient presents with anxious affect and depressed mood. Pt is very flat and very depressed. Pt stated she is depressed all the time, and does not know how to get depression out of her head. Pt stated " I am not anxious, I am just too depressed and don't know what to." Denies pain, auditory and visual hallucinations.  Rates depression at 9, hopelessness at 7, and anxiety at 4.  Maintained on routine safety checks.  Medications given as prescribed.  Support and encouragement offered as needed. Stayed in the room most of the time, will continue to monitor.

## 2017-06-22 MED ORDER — LORAZEPAM 0.5 MG PO TABS: 0.5000 mg | ORAL_TABLET | Freq: Four times a day (QID) | ORAL | 0 refills | Status: DC | PRN

## 2017-06-22 MED ORDER — BENZTROPINE MESYLATE 0.5 MG PO TABS: 0.5000 mg | ORAL_TABLET | Freq: Every day | ORAL | 0 refills | Status: AC

## 2017-06-22 MED ORDER — ARIPIPRAZOLE 15 MG PO TABS: 15.0000 mg | ORAL_TABLET | Freq: Every day | ORAL | 0 refills | Status: AC

## 2017-06-22 NOTE — [facility] Suicide Risk Assessment (Signed)
[facility]   Principal Problem: Bipolar I disorder, current or most recent episode manic, with psychotic features [facility](HCC) Discharge Diagnoses:  Patient Active Problem List   Diagnosis Date Noted  . Bipolar disorder, curr episode depressed, severe, w/psychotic features (HCC) [F31.5] 06/01/2017  . Bipolar I disorder, current or most recent episode manic, with psychotic features (HCC) [F31.2]   . Bipolar affective disorder, current episode mild (HCC) [F31.9] 01/05/2017  . Manic behavior (HCC) [F30.10]   . Facial cellulitis [L03.211] 02/10/2014    Total Time spent with patient: 30 minutes  Musculoskeletal: Strength & Muscle Tone: within normal limits Gait & Station: normal Patient leans: N/A  Psychiatric Specialty Exam: Review of Systems  Constitutional: Negative for chills and fever.  Respiratory: Negative for cough and hemoptysis.   Cardiovascular: Negative for chest pain.  Gastrointestinal: Negative for heartburn, nausea and vomiting.  Psychiatric/Behavioral: Positive for depression. Negative for hallucinations and suicidal ideas. The patient is nervous/anxious.     Blood pressure 115/85, pulse (!) 101, temperature 98.8 F (37.1 C), resp. rate 18, height 5\' 2"  (1.575 m), weight 43.5 kg (96 lb).Body mass index is 17.56 kg/m.  General Appearance: Casual  Eye Contact::  Good  Speech:  Clear and Coherent and Normal Rate  Volume:  Normal  Mood:  Depressed  Affect:  Appropriate, Congruent and Constricted  Thought Process:  Coherent and Goal Directed  Orientation:  Full (Time, Place, and Person)  Thought Content:  Logical  Suicidal Thoughts:  No  Homicidal Thoughts:  No  Memory:  Immediate;   Fair Recent;   Fair Remote;   Fair  Judgement:  Fair  Insight:  Fair  Psychomotor Activity:  Normal  Concentration:  Fair  Recall:  [employer]Fair  Fund of Knowledge:Fair  Language: Fair  Akathisia:  No  Handed:    AIMS (if indicated):     Assets:  Communication  Skills Resilience Social Support  Sleep:  Number of Hours: 6  Cognition: WNL  ADL's:  Intact   Mental Status Per Nursing Assessment::   On Admission:     Demographic Factors:  Low socioeconomic status and Unemployed  Loss Factors: Financial problems/change in socioeconomic status  Historical Factors: Impulsivity  Risk Reduction Factors:   Positive social support, Positive therapeutic relationship and Positive coping skills or problem solving skills  Continued Clinical Symptoms:  Bipolar Disorder:   Depressive phase Depression:   Severe  Cognitive Features That Contribute To Risk:  None    Suicide Risk:  Minimal: No identifiable suicidal ideation.  Patients presenting with no risk factors but with morbid ruminations; may be classified as minimal risk based on the severity of the depressive symptoms  Follow-up Information    [facility] Follow up on 06/24/2017.   Why:  Friday at [facility]9AM for your hospital follow up appointment.  You will see 2 clinicians-[name] and Morrie SheldonAshley.  Please bring along your hospital d/c paperwork Contact information: [address] [place] [address] [phone]         Subjective Data:  Yvonne [name] is a 51 y/o F with history of Bipolar I who was admittedwithworsening symptoms of psychosis initially on IVC but later she signed in voluntarily.Prior to hospitalization, pt was not adherent to prescribed medications. She was in jail recently before admission and she had beenrefusing medical treatment/not meeting her ADL's. After admission,ptwas restarted on abilify. Shehad improvement of her symptoms including improved hygiene and food/liquid intake, and staff recorded pt has been gaining weight. Pt had improvement of her  mood symptoms during this hospitalization, and she has been tolerating her treatment regimen without difficulty.  Pt has been accepted to a group home after interview, and her anticipated discharge to group home is today  06/22/17.  Today upon evaluation, pt reports she continues to struggle with depression. She explains, "I just can't get it out of my head - this depression." Pt describes feeling overwhelmed and having difficulty with decisions. She denies SI/HI/AH/VH. She has been eating adequately and her hygiene is fair. Pt was provided reassurance that she is currently participating in her treatment for depression and she is doing all the right things. Pt was encouraged to utilize her coping skills such as soothing behaviors and distractions. Discussed with patient that she will receive additional support at her group home and she will only have to worry about her own ADL's and staying involved. Pt verbalized good understanding and she was in agreement with the plan to discharge to group home. Pt was able to engage in safety planning including [facility] or contact emergency services if she feels unable to maintain her own safety. Pt had no further questions, comments, or concerns.    Plan Of Care/Follow-up recommendations:  - Discharge to outpatient level of care (discharge directly to group home)  - Bipolar I - Continueabilify 10mg  qDay   Activity:  as tolerated Diet:  normal Tests:  NA Other:  see above for DC plan  Micheal Likenshristopher T Farah Benish, MD 06/22/2017, 9:00 AM

## 2017-06-22 NOTE — [place] FL2 (Addendum)
  [place] MEDICAID FL2 LEVEL OF CARE SCREENING TOOL     IDENTIFICATION  Patient Name: Yvonne [name] Birthdate: 26-Mar-1966 Sex: female Admission Date (Current Location): 06/01/2017  [facility] [id number]   Facility and Address:  [facility] [address], [place], [place] [address] )      Provider Number: [id number]  Attending Physician Name and Address:  Jolyne Loaainville, [name], MD  Relative Name and Phone Number:  Birdie RiddleHazel [name], [phone]    Current Level of Care: Hospital Recommended Level of Care: Assisted Living Facility, Family Care Home Prior Approval Number:    Date Approved/Denied:   PASRR Number:    [facility]    Current Diagnoses: Patient Active Problem List   Diagnosis Date Noted  . Bipolar disorder, curr episode depressed, severe, w/psychotic features (HCC) 06/01/2017  . Bipolar I disorder, current or most recent episode manic, with psychotic features (HCC)   . Bipolar affective disorder, current episode mild (HCC) 01/05/2017  . Manic behavior (HCC)   . Facial cellulitis 02/10/2014    Orientation RESPIRATION BLADDER Height & Weight     Self, Time, Situation, Place  Normal Continent Weight: 96 lb (43.5 kg) Height:  5\' 2"  (157.5 cm)  BEHAVIORAL SYMPTOMS/MOOD NEUROLOGICAL BOWEL NUTRITION STATUS  (None ) (None ) Continent (Regular Diet )  AMBULATORY STATUS COMMUNICATION OF NEEDS Skin   Independent Verbally Normal                       Personal Care Assistance Level of Assistance  Bathing, Feeding, Dressing Bathing Assistance: Independent Feeding assistance: Independent Dressing Assistance: Independent     Functional Limitations Info  (None )          SPECIAL CARE FACTORS FREQUENCY  ( None )                    Contractures Contractures Info: Not present    Additional Factors Info  Psychotropic     Psychotropic Info: Perscribed medications          Current Medications  (06/22/2017):  This is the current hospital active medication list Aripiprazole (ABILIFY) tablet Dose: 15 mg Freq: Daily Route: PO Start: 06/21/17 0800   0733    0800    0800    0800    0800    0800    0800     benztropine (COGENTIN) tablet 0.5 mg  Dose: 0.5 mg Freq: Daily Route: PO Start: 06/18/17 1015   0733    0800    0800    0800    0800    0800    0800     multivitamin with minerals tablet 1 tablet  Dose: 1 tablet Freq: Daily Route: PO Start: 06/05/17 1015               Discharge Medications: Please see discharge summary for a list of discharge medications.  Relevant Imaging Results:  Relevant Lab Results:   Additional Information    Yvonne Rogueodney B Jenea Dake, LCSW

## 2017-06-22 NOTE — Progress Notes (Signed)
Discharge Note:  Patient discharged to group home with driver.  Patient denied SI and HI.  Denied A/V hallucinations.  Denied pain.  Suicide prevention information given and discussed with patient who stated she understood and had no questions.  Patient stated she received all her belongings.  Patient stated she [facility].  All required discharge information given to patient at discharge.

## 2017-06-22 NOTE — Progress Notes (Signed)
  [facility] Discharge Plan :  Will you be returning to the same living situation after discharge:  No. At discharge, do you have transportation home?: Yes,  [name] Do you have the ability to pay for your medications: Yes,  sent with month supply, mental health  Release of information consent forms completed and in the chart;  Patient's signature needed at discharge.  Patient to Follow up at: Follow-up Information    [facility] Follow up on 06/24/2017.   Why:  Friday at [facility] for your hospital follow up appointment.  You will see 2 clinicians-[name] and Morrie SheldonAshley.  Please bring along your hospital d/c paperwork Contact information: [address] [place] [address] [phone]           Next level of care provider has [facility] Link:no  Safety Planning and Suicide Prevention discussed: Yes,  yes  Have you used any form of tobacco in the last 30 days? (Cigarettes, Smokeless Tobacco, Cigars, and/or Pipes): No  Has patient been referred to the Quitline?: N/A patient is not a smoker  Patient has been referred for addiction treatment: N/A  Yvonne RogueRodney [name] Yvonne Rone, LCSW 06/22/2017, 8:53 AM

## 2017-06-22 NOTE — Progress Notes (Signed)
Recreation Therapy Notes  INPATIENT RECREATION TR PLAN  Patient Details Name: Yvonne [name] MRN: [id number] DOB: 1966/02/14 Today's Date: 06/22/2017  Rec Therapy Plan Is patient appropriate for Therapeutic Recreation?: Yes Treatment times per week: about 3 days  Estimated Length of Stay: 5-7 days TR Treatment/Interventions: Group participation (Comment)  Discharge Criteria Pt will be discharged from therapy if:: Discharged Treatment plan/goals/alternatives discussed and agreed upon by:: Patient/family  Discharge Summary Short term goals set: Pt will attend and participate in recreation therapy group sessions. Short term goals met: Adequate for discharge Progress toward goals comments: Groups attended Which groups?: Self-esteem, Wellness, Communication, Coping skills, Leisure education, Goal setting, Anger management Reason goals not met: Pt did not participate in groups. Therapeutic equipment acquired: N/A Reason patient discharged from therapy: Discharge from hospital Pt/family agrees with progress & goals achieved: Yes Date patient discharged from therapy: 06/22/17    Victorino Sparrow, LRT/CTRS  Ria Comment, Marshia Tropea A 06/22/2017, 11:38 AM

## 2017-06-22 NOTE — Progress Notes (Signed)
D:  Patient denied SI and HI, contracts for safety.  Denied A/V hallucinations.  Denied pain. A:  Medications administered per MD orders.  Emotional support and encouragement given patient. R:  Safety maintained with 15 minute checks.  

## 2017-06-22 NOTE — Discharge Summary (Signed)
Physician Discharge Summary Note  Patient:  Yvonne [name] is an 51 y.o., female MRN:  [id number] DOB:  [birth year] Patient phone:  [phone] (home)  Patient address:   [place] [address],   Total Time spent with patient: Greater than 30 minutes  Date of Admission:  06/01/2017 Date of Discharge: 06-22-17  Reason for Admission: Worsening symptoms of Bipolar disorder,  episode.  Principal Problem: Bipolar I disorder, current or most recent episode manic, with psychotic features [facility])  Discharge Diagnoses: Patient Active Problem List   Diagnosis Date Noted  . Bipolar disorder, curr episode depressed, severe, w/psychotic features (HCC) [F31.5] 06/01/2017  . Bipolar I disorder, current or most recent episode manic, with psychotic features (HCC) [F31.2]   . Bipolar affective disorder, current episode mild (HCC) [F31.9] 01/05/2017  . Manic behavior (HCC) [F30.10]   . Facial cellulitis [L03.211] 02/10/2014   Past Psychiatric History: Bipolar affective disorder.  Past Medical History:  Past Medical History:  Diagnosis Date  . Bipolar 1 disorder (HCC)   . Medical history non-contributory   . Mental disorder   . No pertinent past medical history     Past Surgical History:  Procedure Laterality Date  . NO PAST SURGERIES     Family History:  Family History  Problem Relation Age of Onset  . Depression Sister   . Depression Brother   . Depression Sister   . CAD Mother   . Hypertension Father   . Diabetes Father   . Bipolar disorder Cousin    Family Psychiatric  History: See H&P Social History:  Social History   Substance and Sexual Activity  Alcohol Use No     Social History   Substance and Sexual Activity  Drug Use No    Social History   Socioeconomic History  . Marital status: Single    Spouse name: None  . Number of children: None  . Years of education: None  . Highest education level: None  Social Needs  . Financial resource strain: None   . Food insecurity - worry: None  . Food insecurity - inability: None  . Transportation needs - medical: None  . Transportation needs - non-medical: None  Occupational History  . None  Tobacco Use  . Smoking status: Never Smoker  . Smokeless tobacco: Never Used  Substance and Sexual Activity  . Alcohol use: No  . Drug use: No  . Sexual activity: No  Other Topics Concern  . None  Social History Narrative  . None   Hospital Course: Yvonne [name] is a 51 y/o F with history of Bipolar I who was admittedwithworsening symptoms of psychosis initially on IVC but later she signed in voluntarily.Prior to hospitalization, pt was not adherentto prescribed medications. She was in jail recently before admission and she had beenrefusing medical treatment/not meeting her ADL's. After admission,ptwas restarted on abilify. Shehad improvement of her symptoms including improved hygiene and food/liquid intake, and staff recorded pt has been gaining weight.Pt had improvement of her mood symptoms during this hospitalization, and she has been tolerating her treatment regimen without difficulty.  Pt has been accepted to a group home after interview, and her anticipated discharge to group home is today 06/22/17.  After admission assessment, Yvonne [name]'s presenting symptoms were identified & the medication regimen for the symptoms initiated. She was medicated & discharged on; Abilify 15 mg for mood control, Cogentin 0.5 mg for EPS & Lorazepam 0.5 mg prn for anxiety. She was enrolled & participated in the group counseling sessions being  offered & held on this unit. He presented no other significant health issues that required treatment.  Today upon her discharge evaluation,pt reports she continues to struggle with depression. She explains, "I just can't get it out of my head - this depression." Pt describes feeling overwhelmed and having difficulty with decisions. She denies SI/HI/AH/VH. She has been eating  adequately and her hygiene is fair. Pt was provided reassurance that she is currently participating in her treatment for depression and she is doing all the right things. Pt was encouraged to utilize her coping skills such as soothing behaviors and distractions. Discussed with patient that she will receive additional support at her group home and she will only have to worry about her own ADL's and staying involved. Pt verbalized good understanding and she was in agreement with the plan to discharge to group home. Pt was able to engage in safety planning including [facility] or contact emergency services if she feels unable to maintain her own safety. Pt had no further questions, comments, or concerns.   Yvonne [name] was provided with a 7 days worth supply samples of her [facility] medications. [facility] with all personal belongings in no apparent distress. [facility].  Physical Findings: AIMS: Facial and Oral Movements Muscles of Facial Expression: None, normal Lips and Perioral Area: None, normal Jaw: None, normal Tongue: None, normal,Extremity Movements Upper (arms, wrists, hands, fingers): None, normal Lower (legs, knees, ankles, toes): None, normal, Trunk Movements Neck, shoulders, hips: None, normal, Overall Severity Severity of abnormal movements (highest score from questions above): None, normal Incapacitation due to abnormal movements: None, normal Patient's awareness of abnormal movements (rate only patient's report): No Awareness, Dental Status Current problems with teeth and/or dentures?: No Does patient usually wear dentures?: No  CIWA:  CIWA-Ar Total: 1 COWS:  COWS Total Score: 3  Musculoskeletal: Strength & Muscle Tone: within normal limits Gait & Station: normal Patient leans: N/A  Psychiatric Specialty Exam: Physical Exam  Constitutional: She appears well-developed.  HENT:  Head: Normocephalic.  Eyes: Pupils are equal, round,  and reactive to light.  Neck: Normal range of motion.  Cardiovascular: Normal rate.  Respiratory: Effort normal.  Genitourinary:  Genitourinary Comments: Deferred  Musculoskeletal: Normal range of motion.  Neurological: She is alert.  Skin: Skin is warm.    Review of Systems  Constitutional: Negative.   HENT: Negative.   Eyes: Negative.   Respiratory: Negative.   Cardiovascular: Negative.   Gastrointestinal: Negative.   Genitourinary: Negative.   Musculoskeletal: Negative.   Skin: Negative.   Neurological: Negative.   Endo/Heme/Allergies: Negative.   Psychiatric/Behavioral: Positive for depression and hallucinations (Hx. Psychosis, mania). Negative for memory loss, substance abuse and suicidal ideas. The patient has insomnia (Stable). The patient is not nervous/anxious.     Blood pressure 115/85, pulse (!) 101, temperature 98.8 F (37.1 C), resp. rate 18, height 5\' 2"  (1.575 m), weight 43.5 kg (96 lb).Body mass index is 17.56 kg/m.  See Md's SRA   Have you used any form of tobacco in the last 30 days? (Cigarettes, Smokeless Tobacco, Cigars, and/or Pipes): No  Has this patient used any form of tobacco in the last 30 days? (Cigarettes, Smokeless Tobacco, Cigars, and/or Pipes)No  Blood Alcohol level:  Lab Results  Component Value Date   [facility] <10 05/11/2017   ETH <10 05/07/2017   Metabolic Disorder Labs:  Lab Results  Component Value Date   HGBA1C 5.5 06/21/2012   MPG 111 06/21/2012  No results found for: PROLACTIN Lab Results  Component Value Date   CHOL 166 06/21/2012   TRIG 58 06/21/2012   HDL 58 06/21/2012   CHOLHDL 2.9 06/21/2012   VLDL 12 06/21/2012   LDLCALC 96 06/21/2012   See Psychiatric Specialty Exam and Suicide Risk Assessment completed by Attending Physician prior to discharge.  Discharge destination:  [facility]  Is patient on multiple antipsychotic therapies at discharge:  No   Has Patient had three or more failed trials of  antipsychotic monotherapy by history:  No  Recommended Plan for Multiple Antipsychotic Therapies: NA  Allergies as of 06/22/2017   No Known Allergies     Medication List    STOP taking these medications   carbamazepine 200 MG 12 hr tablet Commonly known as:  TEGRETOL XR   hydrOXYzine 25 MG tablet Commonly known as:  ATARAX/VISTARIL   propranolol 10 MG tablet Commonly known as:  INDERAL   traZODone 50 MG tablet Commonly known as:  DESYREL     TAKE these medications     Indication  ARIPiprazole 15 MG tablet Commonly known as:  ABILIFY Take 1 tablet (15 mg total) by mouth daily. For mood control Start taking on:  06/23/2017 What changed:    medication strength  how much to take  additional instructions  Indication:  Mood stable   benztropine 0.5 MG tablet Commonly known as:  COGENTIN Take 1 tablet (0.5 mg total) by mouth daily. For prevention of drug induced tremors Start taking on:  06/23/2017  Indication:  Extrapyramidal Reaction caused by Medications   LORazepam 0.5 MG tablet Commonly known as:  ATIVAN Take 1 tablet (0.5 mg total) by mouth every 6 (six) hours as needed for anxiety.  Indication:  Agitation, Anxiousness associated with Depression      Follow-up Information    [facility] Follow up on 06/24/2017.   Why:  Friday at [facility] for your hospital follow up appointment.  You will see 2 clinicians-[name] and Morrie SheldonAshley.  Please bring along your hospital d/c paperwork Contact information: [address] [place] [address] [phone]          Follow-up recommendations:  Activity:  As tolerated Diet: As recommended by your primary care doctor. Keep all scheduled follow-up appointments as recommended.  Comments: Patient is instructed prior to discharge to: Take all medications as prescribed by his/her mental healthcare provider. Report any adverse effects and or reactions from the medicines to his/her outpatient provider promptly. Patient has been  instructed & cautioned: To not engage in alcohol and or illegal drug use while on prescription medicines. In the event of worsening symptoms, patient is instructed to call the crisis hotline, 911 and or go to the nearest ED for appropriate evaluation and treatment of symptoms. To follow-up with his/her primary care provider for your other medical issues, concerns and or health care needs.   Signed: Sanjuana KavaNwoko, Agnes I, NP, PMHNP, FNP-BC 06/22/2017, 9:48 AM   Patient seen, Suicide Assessment Completed.  Disposition Plan Reviewed   Gypsy BalsamMelvine Hayner is a 51 y/o F with history of Bipolar I who was admittedwithworsening symptoms of psychosis initially on IVC but later she signed in voluntarily.Prior to hospitalization, pt was not adherentto prescribed medications. She was in jail recently before admission and she had beenrefusing medical treatment/not meeting her ADL's. After admission,ptwas restarted on abilify. Shehad improvement of her symptoms including improved hygiene and food/liquid intake, and staff recorded pt has been gaining weight.Pt had improvement of her mood symptoms during  this hospitalization, and she has been tolerating her treatment regimen without difficulty.  Pt has been accepted to a group home after interview, and her anticipated discharge to group home is today 06/22/17.  Today upon evaluation,pt reports she continues to struggle with depression. She explains, "I just can't get it out of my head - this depression." Pt describes feeling overwhelmed and having difficulty with decisions. She denies SI/HI/AH/VH. She has been eating adequately and her hygiene is fair. Pt was provided reassurance that she is currently participating in her treatment for depression and she is doing all the right things. Pt was encouraged to utilize her coping skills such as soothing behaviors and distractions. Discussed with patient that she will receive additional support at her group home and she will  only have to worry about her own ADL's and staying involved. Pt verbalized good understanding and she was in agreement with the plan to discharge to group home. Pt was able to engage in safety planning including plan to return to [facility] or contact emergency services if she feels unable to maintain her own safety. Pt had no further questions, comments, or concerns.    Plan Of Care/Follow-up recommendations:  - Discharge to outpatient level of care (discharge directly to group home)  - Bipolar I - Continueabilify 10mg  qDay   Activity:  as tolerated Diet:  normal Tests:  NA Other:  see above for DC plan  Micheal Likens, MD

## 2017-06-22 NOTE — Plan of Care (Signed)
Pt attended recreation therapy group sessions but did not participate.   Caroll RancherMarjette Chanya Chrisley, LRT/CTRS

## 2017-06-22 NOTE — Progress Notes (Signed)
Recreation Therapy Notes  Date: 06/22/17 Time: 1000 Location: 500 Hall Dayroom  Group Topic: Communication  Goal Area(s) Addresses:  Patient will effectively communicate with peers in group.  Patient will verbalize benefit of healthy communication. Patient will verbalize positive effect of healthy communication on post d/c goals.  Patient will identify communication techniques that made activity effective for group.   Intervention:  Blank paper, pencils, geometrical drawings                   Activity: Back to Back Drawings.  Patients were grouped in pairs.  Patients were seated back to back.  One person was given a picture to describe to their partner.  The person listening could not ask any questions of the speaker.  Once they were finished, they could compare pictures to see how accurate the listener was in drawing the picture.  Education:Communication, Discharge Planning  Education Outcome: Acknowledges understanding/In group clarification offered/Needs additional education.   Clinical Observations/Feedback: Pt did not attend group.    Willy Vorce, LRT/CTRS         Mikail Goostree A 06/22/2017 12:06 PM 

## 2017-06-22 NOTE — NC FL2 (Signed)
  [place] MEDICAID FL2 LEVEL OF CARE SCREENING TOOL     IDENTIFICATION  Patient Name: Yvonne BalsamMelvine [name] Birthdate: July 22, 1965 Sex: female Admission Date (Current Location): 06/01/2017  [facility] [place]  [id number]   Facility and Address:  [facility] [address], [place], [place] [address] )      Provider Number: [id number]  Attending Physician Name and Address:  Jolyne Loaainville, [name], MD  Relative Name and Phone Number:  Birdie RiddleHazel Nelson, [phone]    Current Level of Care: Hospital Recommended Level of Care: Assisted Living Facility, Family Care Home Prior Approval Number:    Date Approved/Denied:   PASRR Number:    [facility]    Current Diagnoses: Patient Active Problem List   Diagnosis Date Noted  . Bipolar disorder, curr episode depressed, severe, w/psychotic features (HCC) 06/01/2017  . Bipolar I disorder, current or most recent episode manic, with psychotic features (HCC)   . Bipolar affective disorder, current episode mild (HCC) 01/05/2017  . Manic behavior (HCC)   . Facial cellulitis 02/10/2014    Orientation RESPIRATION BLADDER Height & Weight     Self, Time, Situation, Place  Normal Continent Weight: 96 lb (43.5 kg) Height:  5\' 2"  (157.5 cm)  BEHAVIORAL SYMPTOMS/MOOD NEUROLOGICAL BOWEL NUTRITION STATUS  (None ) (None ) Continent (Regular Diet )  AMBULATORY STATUS COMMUNICATION OF NEEDS Skin   Independent Verbally Normal                       Personal Care Assistance Level of Assistance  Bathing, Feeding, Dressing Bathing Assistance: Independent Feeding assistance: Independent Dressing Assistance: Independent     Functional Limitations Info  (None )          SPECIAL CARE FACTORS FREQUENCY  ( None )                    Contractures Contractures Info: Not present    Additional Factors Info  Psychotropic     Psychotropic Info: Perscribed medications          Current Medications  (06/22/2017):  This is the current hospital active medication list          ARIPiprazole 15 MG tablet  Commonly known as: ABILIFY  Start taking on: 06/23/2017  Take 1 tablet (15 mg total) by mouth daily. For mood control  What changed:   0 medication strength  0 how much to take  0 additional instructions  For: Mood stable  Next does due:           benztropine 0.5 MG tablet  Commonly known as: COGENTIN  Start taking on: 06/23/2017  Take 1 tablet (0.5 mg total) by mouth daily. For prevention of drug induced tremors  For: Extrapyramidal Reaction caused by Medications  Next does due:          LORazepam 0.5 MG tablet  Commonly known as: ATIVAN  Take 1 tablet (0.5 mg total) by mouth every 6 (six) hours as needed for anxiety.  For: Agitation, Anxiousness associated with Depression  Next does due:                               Discharge Medications: Please see discharge summary for a list of discharge medications.  Relevant Imaging Results:  Relevant Lab Results:   Additional Information    Ida Rogueodney B Katrinna Travieso, LCSW

## 2018-05-09 ENCOUNTER — Other Ambulatory Visit: Payer: Self-pay

## 2018-05-09 ENCOUNTER — Emergency Department (HOSPITAL_COMMUNITY)
Admission: EM | Admit: 2018-05-09 | Discharge: 2018-05-11 | Disposition: A | Discharge status: Other Acute Inpatient Hospital | Payer: Medicaid Other | Attending: Emergency Medicine | Admitting: Emergency Medicine

## 2018-05-09 DIAGNOSIS — Z79899 Other long term (current) drug therapy: Secondary | ICD-10-CM | POA: Insufficient documentation

## 2018-05-09 DIAGNOSIS — F314 Bipolar disorder, current episode depressed, severe, without psychotic features: Secondary | ICD-10-CM | POA: Diagnosis present

## 2018-05-09 DIAGNOSIS — R45851 Suicidal ideations: Secondary | ICD-10-CM | POA: Diagnosis not present

## 2018-05-09 DIAGNOSIS — F349 Persistent mood [affective] disorder, unspecified: Secondary | ICD-10-CM | POA: Diagnosis present

## 2018-05-09 DIAGNOSIS — F319 Bipolar disorder, unspecified: Secondary | ICD-10-CM

## 2018-05-09 DIAGNOSIS — R509 Fever, unspecified: Secondary | ICD-10-CM

## 2018-05-09 LAB — ETHANOL: Alcohol, Ethyl (B): <10 mg/dL (ref <10)

## 2018-05-09 LAB — CBC WITH DIFFERENTIAL/PLATELET
HCT: 34.3 % — ABNORMAL LOW (ref 36.0–46.0)
HEMOGLOBIN: 10.2 g/dL — AB (ref 12.0–15.0)
MCH: 27.2 pg (ref 26.0–34.0)
MCHC: 29.7 g/dL — AB (ref 30.0–36.0)
MCV: 91.5 fL (ref 80.0–100.0)
PLATELETS: 209 10*3/uL (ref 150–400)
RBC: 3.75 MIL/uL — AB (ref 3.87–5.11)
RDW: 14.4 % (ref 11.5–15.5)
WBC: 3.3 10*3/uL — AB (ref 4.0–10.5)
nRBC: 0 % (ref 0.0–0.2)

## 2018-05-09 LAB — COMPREHENSIVE METABOLIC PANEL
ALBUMIN: 2.8 g/dL — AB (ref 3.5–5.0)
ALT: 33 U/L (ref 0–44)
AST: 113 U/L — AB (ref 15–41)
Alkaline Phosphatase: 72 U/L (ref 38–126)
Anion gap: 10 (ref 5–15)
BILIRUBIN TOTAL: 0.7 mg/dL (ref 0.3–1.2)
BUN: 13 mg/dL (ref 6–20)
CALCIUM: 8.4 mg/dL — AB (ref 8.9–10.3)
CO2: 25 mmol/L (ref 22–32)
CREATININE: 0.82 mg/dL (ref 0.44–1.00)
Chloride: 103 mmol/L (ref 98–111)
GFR calc Af Amer: >60 mL/min (ref >60)
GLUCOSE: 106 mg/dL — AB (ref 70–99)
Potassium: 3.8 mmol/L (ref 3.5–5.1)
Sodium: 138 mmol/L (ref 135–145)
TOTAL PROTEIN: 8.5 g/dL — AB (ref 6.5–8.1)

## 2018-05-09 LAB — ACETAMINOPHEN LEVEL: Acetaminophen (Tylenol), Serum: <10 ug/mL — ABNORMAL LOW (ref 10–30)

## 2018-05-09 LAB — SALICYLATE LEVEL: Salicylate Lvl: <7 mg/dL (ref 2.8–30.0)

## 2018-05-09 MED ORDER — ACETAMINOPHEN 325 MG PO TABS
650.0000 mg | ORAL_TABLET | Freq: Once | ORAL | Status: AC
  Administered 2018-05-09: 650 mg via ORAL
  Filled 2018-05-09: qty 2

## 2018-05-09 NOTE — ED Notes (Signed)
CBC will still need to be collected

## 2018-05-09 NOTE — ED Triage Notes (Signed)
[facility] Pt lives in a group home and reports suicidal thoughts and ideation.  Pt is calm and cooperative at this time. Pt reportedly lives at [facility] group home. Pt has a hx of bipolar disorder

## 2018-05-09 NOTE — BH Assessment (Addendum)
Assessment Note  Yvonne [name] is a 52 y.o. female in voluntarily to [facility] due to sudden onset of SI. Pt says, "I just had a bunch of suicidal thoughts, but I wasn't gonna kill myself". Pt reports that she just started having SI @ a week ago. She cannot think of any trigger or stressor to cause the SI. Pt reports she's had thoughts of "taking a knife and putting in in my stomach". Pt denies any intent to carry out her SI plans. Pt denies HI or AVH.   Pt reports that she has been med compliant and does not think she needs to be hospitalized or her meds changed, at this time. Pt reports that she has an appt with mental health at the [facility] on 05/11/2018. Pt does not appear to be responding to internal stimuli or operating in delusional thought.   Staffed case with Malachy Chamber, NP. Pt is recommended to be observed overnight and re-evaluated by psychiatry in the morning.    Diagnosis: F31.4 Bipolar d/o, current episode depressed, severe, w/out psychotic features  Past Medical History:  Past Medical History:  Diagnosis Date  . Bipolar 1 disorder (HCC)   . Medical history non-contributory   . Mental disorder   . No pertinent past medical history     Past Surgical History:  Procedure Laterality Date  . NO PAST SURGERIES      Family History:  Family History  Problem Relation Age of Onset  . Depression Sister   . Depression Brother   . Depression Sister   . CAD Mother   . Hypertension Father   . Diabetes Father   . Bipolar disorder Cousin     Social History:  reports that she has never smoked. She has never used smokeless tobacco. She reports that she does not drink alcohol or use drugs.  Additional Social History:  Alcohol / Drug Use Pain Medications: see PTA meds Prescriptions: see PTA meds Over the Counter: see PTA meds History of alcohol / drug use?: No history of alcohol / drug abuse  CIWA: CIWA-Ar BP: 119/87 Pulse Rate: (!) 111 COWS:    Allergies: No Known  Allergies  Home Medications:  (Not in a hospital admission)  OB/GYN Status:  No LMP recorded. (Menstrual status: Perimenopausal).  General Assessment Data Location of Assessment: [facility] TTS Assessment: In system Is this a Tele or Face-to-Face Assessment?: Face-to-Face Is this an Initial Assessment or a Re-assessment for this encounter?: Initial Assessment Patient Accompanied by:: N/A Language Other than English: No Living Arrangements: In Group Home: (Comment: Name of Group Home)([facility]) What gender do you identify as?: Female Marital status: Divorced Jordan name: Cheeks Pregnancy Status: No Living Arrangements: Group Home Can pt return to current living arrangement?: Yes Admission Status: Voluntary Is patient capable of signing voluntary admission?: Yes Referral Source: Self/Family/Friend     Crisis Care Plan Living Arrangements: Group Home Name of Psychiatrist: none-appt with [facility] Name of Therapist: none-appt with [facility] on [facility]  Education Status Is patient currently in school?: No Is the patient employed, unemployed or receiving disability?: Receiving disability income, Unemployed  Risk to self with the past 6 months Suicidal Ideation: Yes-Currently Present Has patient been a risk to self within the past 6 months prior to admission? : No Suicidal Intent: No Has patient had any suicidal intent within the past 6 months prior to admission? : No Is patient at risk for suicide?: No Suicidal Plan?: Yes-Currently Present Has patient had any suicidal plan within the  past 6 months prior to admission? : No Specify Current Suicidal Plan: putting a knife in her stomach Access to Means: Yes Specify Access to Suicidal Means: knife Previous Attempts/Gestures: No Intentional Self Injurious Behavior: None Family Suicide History: Unknown Persecutory voices/beliefs?: No Depression: No Substance abuse history and/or treatment for substance abuse?:  No Suicide prevention information given to non-admitted patients: Not applicable  Risk to Others within the past 6 months Homicidal Ideation: No Does patient have any lifetime risk of violence toward others beyond the six months prior to admission? : No Thoughts of Harm to Others: No Current Homicidal Intent: No Current Homicidal Plan: No Access to Homicidal Means: No History of harm to others?: No Assessment of Violence: None Noted Does patient have access to weapons?: No Criminal Charges Pending?: No Does patient have a court date: No Is patient on probation?: Unknown  Psychosis Hallucinations: None noted Delusions: None noted  Mental Status Report Appearance/Hygiene: Unremarkable Eye Contact: Good Motor Activity: Unremarkable Speech: Logical/coherent Level of Consciousness: Alert, Quiet/awake Mood: Pleasant Affect: Blunted Anxiety Level: Minimal Thought Processes: Relevant, Coherent Judgement: Partial Orientation: Person, Place, Time, Situation Obsessive Compulsive Thoughts/Behaviors: None  Cognitive Functioning Concentration: Normal Memory: Recent Intact, Remote Intact Is patient IDD: No Insight: Fair Impulse Control: Fair Appetite: Fair Have you had any weight changes? : No Change Sleep: No Change Vegetative Symptoms: None  ADLScreening [facility]) Patient's cognitive ability adequate to safely complete daily activities?: Yes Patient able to express need for assistance with ADLs?: Yes Independently performs ADLs?: Yes (appropriate for developmental age)  Prior Inpatient Therapy Prior Inpatient Therapy: Yes Prior Therapy Dates: multiple admissions with last admission in 2018 Prior Therapy Facilty/Provider(s): [facility] Reason for Treatment: psychosis  Prior Outpatient Therapy Prior Outpatient Therapy: No Does patient have an ACCT team?: No Does patient have Intensive In-House Services?  : No Does patient have [facility] services? : No Does  patient have P4CC services?: No  ADL Screening (condition at time of admission) Patient's cognitive ability adequate to safely complete daily activities?: Yes Is the patient deaf or have difficulty hearing?: No Does the patient have difficulty seeing, even when wearing glasses/contacts?: No Does the patient have difficulty concentrating, remembering, or making decisions?: No Patient able to express need for assistance with ADLs?: Yes Does the patient have difficulty dressing or bathing?: No Independently performs ADLs?: Yes (appropriate for developmental age) Does the patient have difficulty walking or climbing stairs?: No Weakness of Legs: None Weakness of Arms/Hands: None  Home Assistive Devices/Equipment Home Assistive Devices/Equipment: None    Abuse/Neglect Assessment (Assessment to be complete while patient is alone) Abuse/Neglect Assessment Can Be Completed: Yes Physical Abuse: Denies Verbal Abuse: Denies Sexual Abuse: Denies Exploitation of patient/patient's resources: Denies Self-Neglect: Denies     Merchant navy officer (For Healthcare) Does Patient Have a Medical Advance Directive?: No Would patient like information on creating a medical advance directive?: No - Patient declined          Disposition:  Disposition Initial Assessment Completed for this Encounter: Yes  On Site Evaluation by:   Reviewed with Physician:    Laddie Aquas 05/09/2018 4:45 PM

## 2018-05-09 NOTE — ED Provider Notes (Signed)
[facility]-EMERGENCY DEPT Provider Note   CSN: [id number] Arrival date & time: 05/09/18  1257     History   Chief Complaint Chief Complaint  Patient presents with  . Suicidal    HPI Yvonne [name] is a 52 y.o. female.  The history is provided by the patient.  Mental Health Problem  Presenting symptoms: suicidal thoughts and suicidal threats   Degree of incapacity (severity):  Mild Onset quality:  Gradual Timing:  Constant Progression:  Worsening Chronicity:  New Context: recent medication change   Treatment compliance:  Most of the time Relieved by:  None tried Worsened by:  Nothing Ineffective treatments:  None tried Associated symptoms: no abdominal pain     Past Medical History:  Diagnosis Date  . Bipolar 1 disorder (HCC)   . Medical history non-contributory   . Mental disorder   . No pertinent past medical history     Patient Active Problem List   Diagnosis Date Noted  . Bipolar disorder, curr episode depressed, severe, w/psychotic features (HCC) 06/01/2017  . Bipolar I disorder, current or most recent episode manic, with psychotic features (HCC)   . Bipolar affective disorder, current episode mild (HCC) 01/05/2017  . Manic behavior (HCC)   . Facial cellulitis 02/10/2014    Past Surgical History:  Procedure Laterality Date  . NO PAST SURGERIES       OB History   None      Home Medications    Prior to Admission medications   Medication Sig Start Date End Date Taking? Authorizing Provider  ARIPiprazole (ABILIFY) 15 MG tablet Take 1 tablet (15 mg total) by mouth daily. For mood control 06/23/17   Armandina Stammer I, NP  benztropine (COGENTIN) 0.5 MG tablet Take 1 tablet (0.5 mg total) by mouth daily. For prevention of drug induced tremors 06/23/17   Armandina Stammer I, NP  LORazepam (ATIVAN) 0.5 MG tablet Take 1 tablet (0.5 mg total) by mouth every 6 (six) hours as needed for anxiety. 06/22/17   [name], NP    Family  History Family History  Problem Relation Age of Onset  . Depression Sister   . Depression Brother   . Depression Sister   . CAD Mother   . Hypertension Father   . Diabetes Father   . Bipolar disorder Cousin     Social History Social History   Tobacco Use  . Smoking status: Never Smoker  . Smokeless tobacco: Never Used  Substance Use Topics  . Alcohol use: No  . Drug use: No     Allergies   Patient has no known allergies.   Review of Systems Review of Systems  Gastrointestinal: Negative for abdominal pain.  Psychiatric/Behavioral: Positive for suicidal ideas.  All other systems reviewed and are negative.    Physical Exam Updated Vital Signs BP 119/87   Pulse (!) 111   Temp (!) 97.5 F (36.4 C)   Resp 14   SpO2 99%   Physical Exam  Constitutional: She is oriented to person, place, and time. She appears well-developed and well-nourished.  HENT:  Head: Normocephalic and atraumatic.  Eyes: Conjunctivae and EOM are normal.  Neck: Normal range of motion.  Cardiovascular: Normal rate and regular rhythm.  Pulmonary/Chest: No stridor. No respiratory distress.  Abdominal: Soft. Bowel sounds are normal. She exhibits no distension.  Musculoskeletal: Normal range of motion. She exhibits no edema or deformity.  Neurological: She is alert and oriented to person, place, and time. No cranial nerve deficit.  Coordination normal.  Skin: Skin is warm and dry.  Psychiatric: She is withdrawn. She expresses impulsivity. She expresses suicidal ideation. She expresses no homicidal ideation. She expresses no suicidal plans and no homicidal plans.  Nursing note and vitals reviewed.    ED Treatments / Results  Labs (all labs ordered are listed, but only abnormal results are displayed) Labs Reviewed  ACETAMINOPHEN LEVEL - Abnormal; Notable for the following components:      Result Value   Acetaminophen (Tylenol), Serum <10 (*)    All other components within normal limits  CBC  WITH DIFFERENTIAL/PLATELET - Abnormal; Notable for the following components:   WBC 3.3 (*)    RBC 3.75 (*)    Hemoglobin 10.2 (*)    HCT 34.3 (*)    MCHC 29.7 (*)    All other components within normal limits  ETHANOL  SALICYLATE LEVEL  COMPREHENSIVE METABOLIC PANEL  RAPID URINE DRUG SCREEN, HOSP PERFORMED  POC URINE PREG, ED    EKG None  Radiology No results found.  Procedures Procedures (including critical care time)  Medications Ordered in ED Medications - No data to display   Initial Impression / Assessment and Plan / ED Course  I have reviewed the triage vital signs and the nursing notes.  Pertinent labs & imaging results that were available during my care of the patient were reviewed by me and considered in my medical decision making (see chart for details).  New symptoms of suicidal ideation without plan. Will medically clear. TTS consult.   Final Clinical Impressions(s) / ED Diagnoses   Final diagnoses:  None    ED Discharge Orders    None       Shweta Aman, Barbara Cower, MD 05/09/18 1536

## 2018-05-09 NOTE — ED Notes (Signed)
RN stuck pt x1 to obtain bloodwork. RN unable to obtain blood

## 2018-05-09 NOTE — ED Notes (Signed)
Pt alert and oriented. Pt denies any pain or discomfort at this time. Pt denies any si,hi, avh. Pt resting in bed quietly. Will continue to monitor.

## 2018-05-10 ENCOUNTER — Emergency Department (HOSPITAL_COMMUNITY): Payer: Medicaid Other

## 2018-05-10 DIAGNOSIS — F314 Bipolar disorder, current episode depressed, severe, without psychotic features: Secondary | ICD-10-CM

## 2018-05-10 LAB — URINALYSIS, ROUTINE W REFLEX MICROSCOPIC
BILIRUBIN URINE: NEGATIVE
Glucose, UA: NEGATIVE mg/dL
Hgb urine dipstick: NEGATIVE
KETONES UR: NEGATIVE mg/dL
Leukocytes, UA: NEGATIVE
Nitrite: NEGATIVE
PH: 6 (ref 5.0–8.0)
Protein, ur: NEGATIVE mg/dL
SPECIFIC GRAVITY, URINE: 1.01 (ref 1.005–1.030)

## 2018-05-10 LAB — RAPID URINE DRUG SCREEN, HOSP PERFORMED
AMPHETAMINES: NOT DETECTED
BARBITURATES: NOT DETECTED
BENZODIAZEPINES: NOT DETECTED
Cocaine: NOT DETECTED
Opiates: NOT DETECTED
Tetrahydrocannabinol: NOT DETECTED

## 2018-05-10 LAB — COMPREHENSIVE METABOLIC PANEL
ALBUMIN: 2.4 g/dL — AB (ref 3.5–5.0)
ALK PHOS: 66 U/L (ref 38–126)
ALT: 32 U/L (ref 0–44)
AST: 109 U/L — AB (ref 15–41)
Anion gap: 8 (ref 5–15)
BILIRUBIN TOTAL: 0.3 mg/dL (ref 0.3–1.2)
BUN: 10 mg/dL (ref 6–20)
CALCIUM: 8.1 mg/dL — AB (ref 8.9–10.3)
CO2: 26 mmol/L (ref 22–32)
CREATININE: 0.66 mg/dL (ref 0.44–1.00)
Chloride: 103 mmol/L (ref 98–111)
GFR calc Af Amer: >60 mL/min (ref >60)
GFR calc non Af Amer: >60 mL/min (ref >60)
Glucose, Bld: 93 mg/dL (ref 70–99)
Potassium: 3.7 mmol/L (ref 3.5–5.1)
Sodium: 137 mmol/L (ref 135–145)
TOTAL PROTEIN: 7.9 g/dL (ref 6.5–8.1)

## 2018-05-10 LAB — I-STAT BETA HCG BLOOD, ED (MC, WL, AP ONLY)

## 2018-05-10 LAB — CBC
HEMATOCRIT: 28 % — AB (ref 36.0–46.0)
HEMOGLOBIN: 8.6 g/dL — AB (ref 12.0–15.0)
MCH: 27 pg (ref 26.0–34.0)
MCHC: 30.7 g/dL (ref 30.0–36.0)
MCV: 88.1 fL (ref 80.0–100.0)
Platelets: 183 10*3/uL (ref 150–400)
RBC: 3.18 MIL/uL — AB (ref 3.87–5.11)
RDW: 14.1 % (ref 11.5–15.5)
WBC: 3.1 10*3/uL — ABNORMAL LOW (ref 4.0–10.5)
nRBC: 0 % (ref 0.0–0.2)

## 2018-05-10 LAB — CBC WITH DIFFERENTIAL/PLATELET
Abs Immature Granulocytes: 0.04 10*3/uL (ref 0.00–0.07)
BASOS PCT: 0 %
Basophils Absolute: 0 10*3/uL (ref 0.0–0.1)
EOS ABS: 0 10*3/uL (ref 0.0–0.5)
Eosinophils Relative: 0 %
HCT: 28.1 % — ABNORMAL LOW (ref 36.0–46.0)
Hemoglobin: 8.8 g/dL — ABNORMAL LOW (ref 12.0–15.0)
Immature Granulocytes: 1 %
Lymphocytes Relative: 23 %
Lymphs Abs: 0.8 10*3/uL (ref 0.7–4.0)
MCH: 27.4 pg (ref 26.0–34.0)
MCHC: 31.3 g/dL (ref 30.0–36.0)
MCV: 87.5 fL (ref 80.0–100.0)
MONO ABS: 0.1 10*3/uL (ref 0.1–1.0)
Monocytes Relative: 4 %
Neutro Abs: 2.3 10*3/uL (ref 1.7–7.7)
Neutrophils Relative %: 72 %
PLATELETS: 218 10*3/uL (ref 150–400)
RBC: 3.21 MIL/uL — ABNORMAL LOW (ref 3.87–5.11)
RDW: 14 % (ref 11.5–15.5)
WBC: 3.2 10*3/uL — ABNORMAL LOW (ref 4.0–10.5)
nRBC: 0 % (ref 0.0–0.2)

## 2018-05-10 LAB — POC URINE PREG, ED: Preg Test, Ur: NEGATIVE

## 2018-05-10 LAB — INFLUENZA PANEL BY PCR (TYPE A & B)
Influenza A By PCR: NEGATIVE
Influenza B By PCR: NEGATIVE

## 2018-05-10 LAB — POC OCCULT BLOOD, ED: Fecal Occult Bld: NEGATIVE

## 2018-05-10 LAB — I-STAT CG4 LACTIC ACID, ED: Lactic Acid, Venous: 0.74 mmol/L (ref 0.5–1.9)

## 2018-05-10 MED ORDER — HYDROXYZINE HCL 25 MG PO TABS: 25.0000 mg | ORAL_TABLET | Freq: Three times a day (TID) | ORAL | Status: DC | PRN

## 2018-05-10 MED ORDER — IBUPROFEN 200 MG PO TABS
600.0000 mg | ORAL_TABLET | Freq: Once | ORAL | Status: AC
  Administered 2018-05-10: 600 mg via ORAL
  Filled 2018-05-10: qty 3

## 2018-05-10 MED ORDER — DOXYCYCLINE HYCLATE 100 MG PO TABS
100.0000 mg | ORAL_TABLET | Freq: Two times a day (BID) | ORAL | Status: DC
  Administered 2018-05-10: 100 mg via ORAL
  Filled 2018-05-10 (×2): qty 1

## 2018-05-10 MED ORDER — ARIPIPRAZOLE 5 MG PO TABS
15.0000 mg | ORAL_TABLET | Freq: Once | ORAL | Status: AC
  Administered 2018-05-10: 15 mg via ORAL
  Filled 2018-05-10: qty 1

## 2018-05-10 MED ORDER — CEPHALEXIN 500 MG PO CAPS: 500.0000 mg | ORAL_CAPSULE | Freq: Two times a day (BID) | ORAL | Status: DC

## 2018-05-10 MED ORDER — SODIUM CHLORIDE 0.9 % IV BOLUS
1000.0000 mL | Freq: Once | INTRAVENOUS | Status: AC
  Administered 2018-05-10: 1000 mL via INTRAVENOUS

## 2018-05-10 NOTE — ED Notes (Signed)
Bed: WA24 Expected date:  Expected time:  Means of arrival:  Comments: 

## 2018-05-10 NOTE — BH Assessment (Signed)
[facility] Assessment Progress Note  Per Juanetta Beets, DO, this pt requires psychiatric hospitalization at this time.  At 15:45 Alene calls from [facility].  Pt has been accepted to their facility by Dr Wendall Stade to the [facility].  Hillery Jacks, NP, concurs with this disposition, as does the pt who is currently under voluntary status.  Pt's nurse, Kendal Hymen, has been notified, and agrees to call report to [phone].  Pt is to be transported via Leota Sauers, [place] Behavioral Health Coordinator [phone]

## 2018-05-10 NOTE — Progress Notes (Signed)
Per Dr. Sharma Covert and Hillery Jacks, NP patient meets in patient criteria. TTS to seek placement.

## 2018-05-10 NOTE — ED Notes (Addendum)
Pt is very withdrawn and depressed.  She contracts for safety but does not feel ready for discharge.  Pt took her medication without trouble.  Pt asked me to notify her sister and tell her she is being admitted.  Family was notified as directed.

## 2018-05-10 NOTE — ED Notes (Signed)
Bed: NW29 Expected date:  Expected time:  Means of arrival:  Comments: 36 per [facility]

## 2018-05-10 NOTE — ED Provider Notes (Addendum)
5:06 PM I was called to assess patient due to a fever of 101.5.  The patient denies any acute complaints besides being suicidal and depressed.  She is not altered on my exam.  She has no meningismus.  She has no feelings of a fever and denies headache, cough, sore throat, shortness of breath or chest pain, abdominal pain, vomiting or diarrhea, or urinary symptoms.  She had a urinalysis obtained this morning that was negative.  Chart review shows she also had a fever with a temperature of 101.3 last night at around 9 PM.  However this was never relayed to her provider.  She does seem to have a dark red rash to her right lateral neck.  She states is been there for about 3 weeks and she was recently treated for a skin infection after she scratched it.  It is not warm/hot.  There is no other rash or boils.  This is at the time, the only possible source of infection I can see based on her exam.  I will start work-up to make sure there is no occult sepsis or other clear source of infection. If nothing shows up will treat with doxycyline to cover skin infection.  7:55 PM Patient's lab work is reassuring except for a drop in hemoglobin.  Clinically this does not make sense however as she has not had any signs or symptoms of bleeding.  No reported melena.  Occult blood exam was negative.  Given this clinically does not make sense, we will repeat this in about 4 hours.  If it is stable, then I think she can continue to be observed and probably would be stable to be admitted to a psych facility.  If it is dropping she will need to be admitted.  Currently no obvious source of fever has been found and the chest x-ray was negative.  While blood cultures are pending, I think continue with doxycycline for presumed possible cellulitis is reasonable.  She does not appear to have sepsis.  She has some low blood pressures although this seems to be somewhat near her baseline of 90/low 100s.  She is not symptomatic from this.  11:27  PM Patient's hemoglobin is now stable.  It is unclear why it dropped from previous check on 10/29.  Is possible this one was abnormal and it of itself and her true baseline is around 8.  She has previously been anemic in the past.  She has some soft blood pressures but no hypotension and she does not appear to be symptomatic.  She shows no signs or symptoms of septic shock.  She is not altered. Her BP has been in the 90s for about 24 hours, and so I think if this was pathologic it would continue to drop.  At this point, while she did have the fever, I do not see any obvious bacterial cause.  We are covering for the possible cellulitis although is unclear if this is truly cellulitis.  She is not rigid in her muscles to suggest a neuroleptic malignant syndrome. Is not altered. At this point I think she can continue to get antibiotics and if she remains afebrile throughout the night and into tomorrow, probably can start to look for psychiatric placement again.  Results for orders placed or performed during the hospital encounter of 05/09/18  Culture, blood (routine x 2)  Result Value Ref Range   Specimen Description      BLOOD RIGHT ANTECUBITAL Performed at [facility],  [address] [place], [place] [address]    Special Requests      BOTTLES DRAWN AEROBIC AND ANAEROBIC Blood Culture adequate volume Performed at [facility], [address], [place], [place] [address]    Culture PENDING    Report Status PENDING   Culture, blood (routine x 2)  Result Value Ref Range   Specimen Description      BLOOD LEFT ANTECUBITAL Performed at [facility], [address] [place], [place] [address]    Special Requests      BOTTLES DRAWN AEROBIC AND ANAEROBIC Blood Culture adequate volume Performed at [facility], [address] [place], [place] [address]    Culture PENDING    Report Status PENDING   Comprehensive metabolic panel  Result Value Ref Range    Sodium 138 135 - 145 mmol/L   Potassium 3.8 3.5 - 5.1 mmol/L   Chloride 103 98 - 111 mmol/L   CO2 25 22 - 32 mmol/L   Glucose, Bld 106 (H) 70 - 99 mg/dL   BUN 13 6 - 20 mg/dL   Creatinine, Ser 2.95 0.44 - 1.00 mg/dL   Calcium 8.4 (L) 8.9 - 10.3 mg/dL   Total Protein 8.5 (H) 6.5 - 8.1 g/dL   Albumin 2.8 (L) 3.5 - 5.0 g/dL   AST 621 (H) 15 - 41 U/L   ALT 33 0 - 44 U/L   Alkaline Phosphatase 72 38 - 126 U/L   Total Bilirubin 0.7 0.3 - 1.2 mg/dL   GFR calc non Af Amer >60 >60 mL/min   GFR calc Af Amer >60 >60 mL/min   Anion gap 10 5 - 15  Ethanol  Result Value Ref Range   Alcohol, Ethyl (B) <10 <10 mg/dL  Salicylate level  Result Value Ref Range   Salicylate Lvl <7.0 2.8 - 30.0 mg/dL  Acetaminophen level  Result Value Ref Range   Acetaminophen (Tylenol), Serum <10 (L) 10 - 30 ug/mL  Rapid urine drug screen (hospital performed)  Result Value Ref Range   Opiates NONE DETECTED NONE DETECTED   Cocaine NONE DETECTED NONE DETECTED   Benzodiazepines NONE DETECTED NONE DETECTED   Amphetamines NONE DETECTED NONE DETECTED   Tetrahydrocannabinol NONE DETECTED NONE DETECTED   Barbiturates NONE DETECTED NONE DETECTED  CBC with Differential/Platelet  Result Value Ref Range   WBC 3.3 (L) 4.0 - 10.5 K/uL   RBC 3.75 (L) 3.87 - 5.11 MIL/uL   Hemoglobin 10.2 (L) 12.0 - 15.0 g/dL   HCT 30.8 (L) 65.7 - 84.6 %   MCV 91.5 80.0 - 100.0 fL   MCH 27.2 26.0 - 34.0 pg   MCHC 29.7 (L) 30.0 - 36.0 g/dL   RDW 96.2 95.2 - 84.1 %   Platelets 209 150 - 400 K/uL   nRBC 0.0 0.0 - 0.2 %   Neutrophils Relative %  %    THIS TEST WAS ORDERED IN ERROR AND HAS BEEN CREDITED.   Neutro Abs  1.7 - 7.7 K/uL    THIS TEST WAS ORDERED IN ERROR AND HAS BEEN CREDITED.   Band Neutrophils  %    THIS TEST WAS ORDERED IN ERROR AND HAS BEEN CREDITED.   Lymphocytes Relative  %    THIS TEST WAS ORDERED IN ERROR AND HAS BEEN CREDITED.   Lymphs Abs  0.7 - 4.0 K/uL    THIS TEST WAS ORDERED IN ERROR AND HAS BEEN CREDITED.    Monocytes Relative  %  THIS TEST WAS ORDERED IN ERROR AND HAS BEEN CREDITED.   Monocytes Absolute  0.1 - 1.0 K/uL    THIS TEST WAS ORDERED IN ERROR AND HAS BEEN CREDITED.   Eosinophils Relative  %    THIS TEST WAS ORDERED IN ERROR AND HAS BEEN CREDITED.   Eosinophils Absolute  0.0 - 0.5 K/uL    THIS TEST WAS ORDERED IN ERROR AND HAS BEEN CREDITED.   Basophils Relative  %    THIS TEST WAS ORDERED IN ERROR AND HAS BEEN CREDITED.   Basophils Absolute  0.0 - 0.1 K/uL    THIS TEST WAS ORDERED IN ERROR AND HAS BEEN CREDITED.   WBC Morphology      THIS TEST WAS ORDERED IN ERROR AND HAS BEEN CREDITED.   RBC Morphology      THIS TEST WAS ORDERED IN ERROR AND HAS BEEN CREDITED.   Smear Review      THIS TEST WAS ORDERED IN ERROR AND HAS BEEN CREDITED.   Other  %    THIS TEST WAS ORDERED IN ERROR AND HAS BEEN CREDITED.   nRBC  0 /100 WBC    THIS TEST WAS ORDERED IN ERROR AND HAS BEEN CREDITED.   Metamyelocytes Relative  %    THIS TEST WAS ORDERED IN ERROR AND HAS BEEN CREDITED.   Myelocytes  %    THIS TEST WAS ORDERED IN ERROR AND HAS BEEN CREDITED.   Promyelocytes Relative  %    THIS TEST WAS ORDERED IN ERROR AND HAS BEEN CREDITED.   Blasts  %    THIS TEST WAS ORDERED IN ERROR AND HAS BEEN CREDITED.  Urinalysis, Routine w reflex microscopic  Result Value Ref Range   Color, Urine YELLOW YELLOW   APPearance CLEAR CLEAR   Specific Gravity, Urine 1.010 1.005 - 1.030   pH 6.0 5.0 - 8.0   Glucose, UA NEGATIVE NEGATIVE mg/dL   Hgb urine dipstick NEGATIVE NEGATIVE   Bilirubin Urine NEGATIVE NEGATIVE   Ketones, ur NEGATIVE NEGATIVE mg/dL   Protein, ur NEGATIVE NEGATIVE mg/dL   Nitrite NEGATIVE NEGATIVE   Leukocytes, UA NEGATIVE NEGATIVE  Comprehensive metabolic panel  Result Value Ref Range   Sodium 137 135 - 145 mmol/L   Potassium 3.7 3.5 - 5.1 mmol/L   Chloride 103 98 - 111 mmol/L   CO2 26 22 - 32 mmol/L   Glucose, Bld 93 70 - 99 mg/dL   BUN 10 6 - 20 mg/dL   Creatinine, Ser  1.61 0.44 - 1.00 mg/dL   Calcium 8.1 (L) 8.9 - 10.3 mg/dL   Total Protein 7.9 6.5 - 8.1 g/dL   Albumin 2.4 (L) 3.5 - 5.0 g/dL   AST 096 (H) 15 - 41 U/L   ALT 32 0 - 44 U/L   Alkaline Phosphatase 66 38 - 126 U/L   Total Bilirubin 0.3 0.3 - 1.2 mg/dL   GFR calc non Af Amer >60 >60 mL/min   GFR calc Af Amer >60 >60 mL/min   Anion gap 8 5 - 15  CBC with Differential  Result Value Ref Range   WBC 3.2 (L) 4.0 - 10.5 K/uL   RBC 3.21 (L) 3.87 - 5.11 MIL/uL   Hemoglobin 8.8 (L) 12.0 - 15.0 g/dL   HCT 04.5 (L) 40.9 - 81.1 %   MCV 87.5 80.0 - 100.0 fL   MCH 27.4 26.0 - 34.0 pg   MCHC 31.3 30.0 - 36.0 g/dL   RDW 91.4 78.2 - 95.6 %  Platelets 218 150 - 400 K/uL   nRBC 0.0 0.0 - 0.2 %   Neutrophils Relative % 72 %   Neutro Abs 2.3 1.7 - 7.7 K/uL   Lymphocytes Relative 23 %   Lymphs Abs 0.8 0.7 - 4.0 K/uL   Monocytes Relative 4 %   Monocytes Absolute 0.1 0.1 - 1.0 K/uL   Eosinophils Relative 0 %   Eosinophils Absolute 0.0 0.0 - 0.5 K/uL   Basophils Relative 0 %   Basophils Absolute 0.0 0.0 - 0.1 K/uL   Immature Granulocytes 1 %   Abs Immature Granulocytes 0.04 0.00 - 0.07 K/uL  Influenza panel by PCR (type A & B)  Result Value Ref Range   Influenza A By PCR NEGATIVE NEGATIVE   Influenza B By PCR NEGATIVE NEGATIVE  CBC  Result Value Ref Range   WBC 3.1 (L) 4.0 - 10.5 K/uL   RBC 3.18 (L) 3.87 - 5.11 MIL/uL   Hemoglobin 8.6 (L) 12.0 - 15.0 g/dL   HCT 95.6 (L) 21.3 - 08.6 %   MCV 88.1 80.0 - 100.0 fL   MCH 27.0 26.0 - 34.0 pg   MCHC 30.7 30.0 - 36.0 g/dL   RDW 57.8 46.9 - 62.9 %   Platelets 183 150 - 400 K/uL   nRBC 0.0 0.0 - 0.2 %  CBC with Differential  Result Value Ref Range   WBC 3.2 (L) 4.0 - 10.5 K/uL   RBC 3.13 (L) 3.87 - 5.11 MIL/uL   Hemoglobin 8.7 (L) 12.0 - 15.0 g/dL   HCT 52.8 (L) 41.3 - 24.4 %   MCV 87.9 80.0 - 100.0 fL   MCH 27.8 26.0 - 34.0 pg   MCHC 31.6 30.0 - 36.0 g/dL   RDW 01.0 27.2 - 53.6 %   Platelets 186 150 - 400 K/uL   nRBC 0.6 (H) 0.0 - 0.2 %    Neutrophils Relative % PENDING %   Neutro Abs PENDING 1.7 - 7.7 K/uL   Band Neutrophils PENDING %   Lymphocytes Relative PENDING %   Lymphs Abs PENDING 0.7 - 4.0 K/uL   Monocytes Relative PENDING %   Monocytes Absolute PENDING 0.1 - 1.0 K/uL   Eosinophils Relative PENDING %   Eosinophils Absolute PENDING 0.0 - 0.5 K/uL   Basophils Relative PENDING %   Basophils Absolute PENDING 0.0 - 0.1 K/uL   WBC Morphology PENDING    RBC Morphology PENDING    Smear Review PENDING    Other PENDING %   nRBC PENDING 0 /100 WBC   Metamyelocytes Relative PENDING %   Myelocytes PENDING %   Promyelocytes Relative PENDING %   Blasts PENDING %  I-Stat beta hCG blood, ED  Result Value Ref Range   I-stat hCG, quantitative <5.0 <5 mIU/mL   Comment 3          POC Urine Pregnancy, ED (do NOT order at [facility])  Result Value Ref Range   Preg Test, Ur NEGATIVE NEGATIVE  I-Stat CG4 Lactic Acid, ED  Result Value Ref Range   Lactic Acid, Venous 0.74 0.5 - 1.9 mmol/L  POC occult blood, ED RN will collect  Result Value Ref Range   Fecal Occult Bld NEGATIVE NEGATIVE   Dg Chest 2 View  Result Date: 05/10/2018 CLINICAL DATA:  Fever intermittently today. EXAM: CHEST - 2 VIEW COMPARISON:  None. FINDINGS: The heart size and mediastinal contours are within normal limits. Both lungs are clear. The visualized skeletal structures are unremarkable. IMPRESSION: No active cardiopulmonary disease.  Electronically Signed   By: Elberta Fortis M.D.   On: 05/10/2018 18:51      Pricilla Loveless, MD 05/10/18 [id number]    Pricilla Loveless, MD 05/10/18 Dorna Mai    Pricilla Loveless, MD 05/11/18 Moses Manners

## 2018-05-10 NOTE — Consult Note (Addendum)
[facility] Face-to-Face Consultation Note   05/10/2018 11:37 AM Yvonne [name]  MRN:  [id number]  Evaluation: Yvonne [name] was seen resting in bed. Patient is awake, alert and oriented * 3. Patient was evaluated by DO and NP.  Reports " I had a mental breakdown" Patient denies any recent stressors related to mood instability. Reports she has been taking medications as prescribed, as she reports tolerating medications well. Patient states she wouldn't feel safe to discharge today. Patient present flat and guarded. Reports hopelessness, depression and suicidal ideations. [facility] reports worsening depression and would like to be evaluated closely. Patient reports she will resche[facility].  Patient reports previous inpatient admissions. Support, encouragement and reassurances was provided.    History: Per assessment note: Yvonne [name] is a 52 y.o. female in voluntarily to [facility] due to sudden onset of SI. Pt says, "I just had a bunch of suicidal thoughts, but I wasn't gonna kill myself". Pt reports that she just started having SI a week ago. She cannot think of any trigger or stressor to cause the SI. Pt reports she's had thoughts of "taking a knife and putting in in my stomach". Pt denies any intent to carry out her SI plans. Pt denies HI or AVH. Pt reports that she has been med compliant and does not think she needs to be hospitalized or her meds changed, at this time. Pt reports that she has an appt with mental health at the [facility] on 05/11/2018. Pt does not appear to be responding to internal stimuli or operating in delusional thought.     Principal Problem: Severe bipolar I disorder, most recent episode depressed ([place]) Diagnosis:   Patient Active Problem List   Diagnosis Date Noted  . Bipolar disorder, curr episode depressed, severe, w/psychotic features ([place]) [F31.5] 06/01/2017  . Bipolar I disorder, current or most recent episode manic, with  psychotic features ([place]) [F31.2]   . Bipolar affective disorder, current episode mild ([place]) [F31.9] 01/05/2017  . Manic behavior ([place]) [F30.10]   . Facial cellulitis [X90.240] 02/10/2014   Total Time spent with patient: 15 minutes  Past Psychiatric History: Previous inpatient admissions  Past Medical History:  Past Medical History:  Diagnosis Date  . Bipolar 1 disorder ([place])   . Medical history non-contributory   . Mental disorder   . No pertinent past medical history     Past Surgical History:  Procedure Laterality Date  . NO PAST SURGERIES     Family History:  Family History  Problem Relation Age of Onset  . Depression Sister   . Depression Brother   . Depression Sister   . CAD Mother   . Hypertension Father   . Diabetes Father   . Bipolar disorder Cousin    Family Psychiatric  History: As listed above.  Social History:  Social History   Substance and Sexual Activity  Alcohol Use No     Social History   Substance and Sexual Activity  Drug Use No    Social History   Socioeconomic History  . Marital status: Single    Spouse name: Not on file  . Number of children: Not on file  . Years of education: Not on file  . Highest education level: Not on file  Occupational History  . Not on file  Social Needs  . Financial resource strain: Not on file  . Food insecurity:    Worry: Not on file    Inability: Not on file  .  Transportation needs:    Medical: Not on file    Non-medical: Not on file  Tobacco Use  . Smoking status: Never Smoker  . Smokeless tobacco: Never Used  Substance and Sexual Activity  . Alcohol use: No  . Drug use: No  . Sexual activity: Never  Lifestyle  . Physical activity:    Days per week: Not on file    Minutes per session: Not on file  . Stress: Not on file  Relationships  . Social connections:    Talks on phone: Not on file    Gets together: Not on file    Attends religious service: Not on file    Active member of club or  organization: Not on file    Attends meetings of clubs or organizations: Not on file    Relationship status: Not on file  Other Topics Concern  . Not on file  Social History Narrative  . Not on file    Sleep: Fair  Appetite:  Fair  Current Medications: Current Facility-Administered Medications  Medication Dose Route Frequency Provider Last Rate Last Dose  . ARIPiprazole (ABILIFY) tablet 15 mg  15 mg Oral Once Derrill Center, NP      . hydrOXYzine (ATARAX/VISTARIL) tablet 25 mg  25 mg Oral TID PRN Derrill Center, NP       Current Outpatient Medications  Medication Sig Dispense Refill  . acetaminophen (TYLENOL) 325 MG tablet Take 650 mg by mouth 3 (three) times daily.    . ARIPiprazole (ABILIFY) 15 MG tablet Take 1 tablet (15 mg total) by mouth daily. For mood control 30 tablet 0  . benztropine (COGENTIN) 0.5 MG tablet Take 1 tablet (0.5 mg total) by mouth daily. For prevention of drug induced tremors 30 tablet 0  . hydrOXYzine (ATARAX/VISTARIL) 25 MG tablet Take 25 mg by mouth 3 (three) times daily as needed for anxiety.    Marland Kitchen LORazepam (ATIVAN) 0.5 MG tablet Take 1 tablet (0.5 mg total) by mouth every 6 (six) hours as needed for anxiety. 30 tablet 0    Lab Results:  Results for orders placed or performed during the hospital encounter of 05/09/18 (from the past 48 hour(s))  Comprehensive metabolic panel     Status: Abnormal   Collection Time: 05/09/18  1:25 PM  Result Value Ref Range   Sodium 138 135 - 145 mmol/L   Potassium 3.8 3.5 - 5.1 mmol/L   Chloride 103 98 - 111 mmol/L   CO2 25 22 - 32 mmol/L   Glucose, Bld 106 (H) 70 - 99 mg/dL   BUN 13 6 - 20 mg/dL   Creatinine, Ser 0.82 0.44 - 1.00 mg/dL   Calcium 8.4 (L) 8.9 - 10.3 mg/dL   Total Protein 8.5 (H) 6.5 - 8.1 g/dL   Albumin 2.8 (L) 3.5 - 5.0 g/dL   AST 113 (H) 15 - 41 U/L   ALT 33 0 - 44 U/L   Alkaline Phosphatase 72 38 - 126 U/L   Total Bilirubin 0.7 0.3 - 1.2 mg/dL   GFR calc non Af Amer >60 >60 mL/min   GFR  calc Af Amer >60 >60 mL/min    Comment: (NOTE) The eGFR has been calculated using the CKD EPI equation. This calculation has not been validated in all clinical situations. eGFR's persistently <60 mL/min signify possible Chronic Kidney Disease.    Anion gap 10 5 - 15    Comment: Performed at [facility], [place] [address] [place], [place]  [address]  Ethanol     Status: None   Collection Time: 05/09/18  1:25 PM  Result Value Ref Range   Alcohol, Ethyl (B) <10 <10 mg/dL    Comment: (NOTE) Lowest detectable limit for serum alcohol is 10 mg/dL. For medical purposes only. Performed at [facility], [place] [address], [place], [place] [address]   Salicylate level     Status: None   Collection Time: 05/09/18  1:25 PM  Result Value Ref Range   Salicylate Lvl <1.4 2.8 - 30.0 mg/dL    Comment: Performed at [facility], [place] [address], [place], [place] [address]  Acetaminophen level     Status: Abnormal   Collection Time: 05/09/18  1:25 PM  Result Value Ref Range   Acetaminophen (Tylenol), Serum <10 (L) 10 - 30 ug/mL    Comment: (NOTE) Therapeutic concentrations vary significantly. A range of 10-30 ug/mL  may be an effective concentration for many patients. However, some  are best treated at concentrations outside of this range. Acetaminophen concentrations >150 ug/mL at 4 hours after ingestion  and >50 ug/mL at 12 hours after ingestion are often associated with  toxic reactions. Performed at [facility], [place] [address], [place], [place] [address]   CBC with Differential/Platelet     Status: Abnormal   Collection Time: 05/09/18  2:18 PM  Result Value Ref Range   WBC 3.3 (L) 4.0 - 10.5 K/uL   RBC 3.75 (L) 3.87 - 5.11 MIL/uL   Hemoglobin 10.2 (L) 12.0 - 15.0 g/dL   HCT 34.3 (L) 36.0 - 46.0 %   MCV 91.5 80.0 - 100.0 fL   MCH 27.2 26.0 - 34.0 pg   MCHC 29.7 (L) 30.0 - 36.0 g/dL   RDW 14.4 11.5 - 15.5 %    Platelets 209 150 - 400 K/uL   nRBC 0.0 0.0 - 0.2 %    Comment: Performed at [facility], [place] [address], [place], [place] [address]   Neutrophils Relative %  %    THIS TEST WAS ORDERED IN ERROR AND HAS BEEN CREDITED.   Neutro Abs  1.7 - 7.7 K/uL    THIS TEST WAS ORDERED IN ERROR AND HAS BEEN CREDITED.   Band Neutrophils  %    THIS TEST WAS ORDERED IN ERROR AND HAS BEEN CREDITED.   Lymphocytes Relative  %    THIS TEST WAS ORDERED IN ERROR AND HAS BEEN CREDITED.   Lymphs Abs  0.7 - 4.0 K/uL    THIS TEST WAS ORDERED IN ERROR AND HAS BEEN CREDITED.   Monocytes Relative  %    THIS TEST WAS ORDERED IN ERROR AND HAS BEEN CREDITED.   Monocytes Absolute  0.1 - 1.0 K/uL    THIS TEST WAS ORDERED IN ERROR AND HAS BEEN CREDITED.   Eosinophils Relative  %    THIS TEST WAS ORDERED IN ERROR AND HAS BEEN CREDITED.   Eosinophils Absolute  0.0 - 0.5 K/uL    THIS TEST WAS ORDERED IN ERROR AND HAS BEEN CREDITED.   Basophils Relative  %    THIS TEST WAS ORDERED IN ERROR AND HAS BEEN CREDITED.   Basophils Absolute  0.0 - 0.1 K/uL    THIS TEST WAS ORDERED IN ERROR AND HAS BEEN CREDITED.   WBC Morphology      THIS TEST WAS ORDERED IN ERROR AND HAS BEEN CREDITED.   RBC Morphology      THIS TEST WAS ORDERED  IN ERROR AND HAS BEEN CREDITED.   Smear Review      THIS TEST WAS ORDERED IN ERROR AND HAS BEEN CREDITED.   Other  %    THIS TEST WAS ORDERED IN ERROR AND HAS BEEN CREDITED.   nRBC  0 /100 WBC    THIS TEST WAS ORDERED IN ERROR AND HAS BEEN CREDITED.   Metamyelocytes Relative  %    THIS TEST WAS ORDERED IN ERROR AND HAS BEEN CREDITED.   Myelocytes  %    THIS TEST WAS ORDERED IN ERROR AND HAS BEEN CREDITED.   Promyelocytes Relative  %    THIS TEST WAS ORDERED IN ERROR AND HAS BEEN CREDITED.   Blasts  %    THIS TEST WAS ORDERED IN ERROR AND HAS BEEN CREDITED.  Rapid urine drug screen (hospital performed)     Status: None   Collection Time: 05/10/18  7:16 AM  Result Value Ref  Range   Opiates NONE DETECTED NONE DETECTED   Cocaine NONE DETECTED NONE DETECTED   Benzodiazepines NONE DETECTED NONE DETECTED   Amphetamines NONE DETECTED NONE DETECTED   Tetrahydrocannabinol NONE DETECTED NONE DETECTED   Barbiturates NONE DETECTED NONE DETECTED    Comment: (NOTE) DRUG SCREEN FOR MEDICAL PURPOSES ONLY.  IF CONFIRMATION IS NEEDED FOR ANY PURPOSE, NOTIFY LAB WITHIN 5 DAYS. LOWEST DETECTABLE LIMITS FOR URINE DRUG SCREEN Drug Class                     Cutoff (ng/mL) Amphetamine and metabolites    1000 Barbiturate and metabolites    200 Benzodiazepine                 283 Tricyclics and metabolites     300 Opiates and metabolites        300 Cocaine and metabolites        300 THC                            50 Performed at [facility], [place] [address], [place], [place] [address]   Urinalysis, Routine w reflex microscopic     Status: None   Collection Time: 05/10/18  7:16 AM  Result Value Ref Range   Color, Urine YELLOW YELLOW   APPearance CLEAR CLEAR   Specific Gravity, Urine 1.010 1.005 - 1.030   pH 6.0 5.0 - 8.0   Glucose, UA NEGATIVE NEGATIVE mg/dL   Hgb urine dipstick NEGATIVE NEGATIVE   Bilirubin Urine NEGATIVE NEGATIVE   Ketones, ur NEGATIVE NEGATIVE mg/dL   Protein, ur NEGATIVE NEGATIVE mg/dL   Nitrite NEGATIVE NEGATIVE   Leukocytes, UA NEGATIVE NEGATIVE    Comment: Microscopic not done on urines with negative protein, blood, leukocytes, nitrite, or glucose < 500 mg/dL. Performed at [facility], [place] [address], [place], [place] [address]    Additional social history: N/A   Blood Alcohol level:  Lab Results  Component Value Date   [facility] <10 05/09/2018   ETH <10 05/11/2017     Musculoskeletal: Strength & Muscle Tone: within normal limits Gait & Station: normal Patient leans: N/A  Psychiatric Specialty Exam: Physical Exam  Nursing note and vitals reviewed. Constitutional: She is oriented to person,  place, and time. She appears well-developed and well-nourished.  HENT:  Head: Normocephalic and atraumatic.  Neck: Normal range of motion.  Respiratory: Effort normal.  Musculoskeletal: Normal range of motion.  Neurological: She is alert and  oriented to person, place, and time.  Psychiatric: Her behavior is normal. Judgment normal. Cognition and memory are normal. She exhibits a depressed mood. She expresses suicidal ideation. She expresses suicidal plans.    Review of Systems  Psychiatric/Behavioral: Positive for depression and suicidal ideas. The patient is nervous/anxious.   All other systems reviewed and are negative.   Blood pressure 98/60, pulse 92, temperature 99.6 F (37.6 C), temperature source Oral, resp. rate 18, SpO2 100 %.There is no height or weight on file to calculate BMI.  General Appearance: Guarded  Eye Contact:  Fair  Speech:  Clear and Coherent  Volume:  Normal  Mood:  Depressed  Affect:  Blunt, Depressed and Flat  Thought Process:  Coherent and Descriptions of Associations: Intact  Orientation:  Full (Time, Place, and Person)  Thought Content:  Logical  Suicidal Thoughts:  No  Homicidal Thoughts:  No  Memory:  Immediate;   Fair Recent;   Fair Remote;   Fair  Judgement:  Fair  Insight:  Fair  Psychomotor Activity:  Normal  Concentration:  Concentration: Fair  Recall:  [employer] of Knowledge:  Fair  Language:  Fair  Akathisia:  No  Handed:  Right  AIMS (if indicated):   N/A  Assets:  Communication Skills Desire for Improvement Social Support  ADL's:  Intact  Cognition:  WNL  Sleep:   N/A      Treatment Plan Summary: Daily contact with patient to assess and evaluate symptoms and progress in treatment and Medication management    Recommendation- Inpatient admission  Continue Abilify 15 mg po Q day    Derrill Center, NP 05/10/2018, 11:37 AM   Patient seen face-to-face for psychiatric evaluation, chart reviewed and case discussed with the  physician extender and developed treatment plan. Reviewed the information documented and agree with the treatment plan.  Buford Dresser, DO 05/10/18 12:10 PM

## 2018-05-10 NOTE — ED Notes (Addendum)
Pt has temp of 101.5.  When we retook it it was 102.3.  Pt has stated she does not feel bad but she has not gotten out of bed all day.  She appears lathargic and morose.  EDP notified.  Pt transfer put on hold.  [facility] notified.

## 2018-05-11 LAB — CBC WITH DIFFERENTIAL/PLATELET
ABS IMMATURE GRANULOCYTES: 0.05 10*3/uL (ref 0.00–0.07)
Basophils Absolute: 0 10*3/uL (ref 0.0–0.1)
Basophils Relative: 0 %
Eosinophils Absolute: 0 10*3/uL (ref 0.0–0.5)
Eosinophils Relative: 0 %
HEMATOCRIT: 27.5 % — AB (ref 36.0–46.0)
Hemoglobin: 8.7 g/dL — ABNORMAL LOW (ref 12.0–15.0)
Immature Granulocytes: 2 %
LYMPHS ABS: 0.9 10*3/uL (ref 0.7–4.0)
LYMPHS PCT: 28 %
MCH: 27.8 pg (ref 26.0–34.0)
MCHC: 31.6 g/dL (ref 30.0–36.0)
MCV: 87.9 fL (ref 80.0–100.0)
MONO ABS: 0.1 10*3/uL (ref 0.1–1.0)
MONOS PCT: 4 %
NEUTROS ABS: 2.1 10*3/uL (ref 1.7–7.7)
Neutrophils Relative %: 66 %
PLATELETS: 186 10*3/uL (ref 150–400)
RBC: 3.13 MIL/uL — ABNORMAL LOW (ref 3.87–5.11)
RDW: 14.3 % (ref 11.5–15.5)
WBC: 3.2 10*3/uL — ABNORMAL LOW (ref 4.0–10.5)
nRBC: 0.6 % — ABNORMAL HIGH (ref 0.0–0.2)

## 2018-05-11 MED ORDER — ARIPIPRAZOLE 5 MG PO TABS: 15.0000 mg | ORAL_TABLET | Freq: Every day | ORAL | Status: DC

## 2018-05-11 NOTE — ED Notes (Signed)
Receiving nurse at [facility] stated tht she was making rounds and needed to be called back in 10 minutes.

## 2018-05-11 NOTE — ED Notes (Signed)
[facility] called for transportation to [facility].

## 2018-05-11 NOTE — ED Provider Notes (Signed)
Vitals:   05/11/18 0424 05/11/18 0624  BP: 104/63 94/68  Pulse: 80 79  Resp: 16 16  Temp:  98.1 F (36.7 C)  SpO2: 97% 99%   6:30 AM Patient has remained afebrile overnight.  Her blood pressure has been somewhat low but this is been consistent for her.  She denies complaint at the present time except for feeling sleepy.  The cause of her fever earlier is unclear but there is no evidence of sepsis.    Paula Libra, MD 05/11/18 [phone]

## 2018-05-15 LAB — CULTURE, BLOOD (ROUTINE X 2)
CULTURE: NO GROWTH
Culture: NO GROWTH
Special Requests: ADEQUATE
Special Requests: ADEQUATE

## 2018-05-23 ENCOUNTER — Other Ambulatory Visit: Payer: Self-pay

## 2018-05-23 ENCOUNTER — Emergency Department (HOSPITAL_COMMUNITY)
Admission: EM | Admit: 2018-05-23 | Discharge: 2018-05-24 | Disposition: A | Discharge status: Home / Self Care | Payer: Medicaid Other | Attending: Emergency Medicine | Admitting: Emergency Medicine

## 2018-05-23 ENCOUNTER — Encounter (HOSPITAL_COMMUNITY): Payer: Self-pay | Admitting: Emergency Medicine

## 2018-05-23 DIAGNOSIS — I951 Orthostatic hypotension: Secondary | ICD-10-CM

## 2018-05-23 DIAGNOSIS — R531 Weakness: Secondary | ICD-10-CM

## 2018-05-23 DIAGNOSIS — F319 Bipolar disorder, unspecified: Secondary | ICD-10-CM | POA: Insufficient documentation

## 2018-05-23 DIAGNOSIS — Z79899 Other long term (current) drug therapy: Secondary | ICD-10-CM | POA: Insufficient documentation

## 2018-05-23 LAB — CBC
HCT: 32.4 % — ABNORMAL LOW (ref 36.0–46.0)
HEMOGLOBIN: 10 g/dL — AB (ref 12.0–15.0)
MCH: 27.6 pg (ref 26.0–34.0)
MCHC: 30.9 g/dL (ref 30.0–36.0)
MCV: 89.5 fL (ref 80.0–100.0)
NRBC: 0.6 % — AB (ref 0.0–0.2)
PLATELETS: 141 10*3/uL — AB (ref 150–400)
RBC: 3.62 MIL/uL — AB (ref 3.87–5.11)
RDW: 14.4 % (ref 11.5–15.5)
WBC: 3.6 10*3/uL — AB (ref 4.0–10.5)

## 2018-05-23 LAB — BASIC METABOLIC PANEL
ANION GAP: 8 (ref 5–15)
BUN: 17 mg/dL (ref 6–20)
CALCIUM: 8.2 mg/dL — AB (ref 8.9–10.3)
CO2: 28 mmol/L (ref 22–32)
Chloride: 102 mmol/L (ref 98–111)
Creatinine, Ser: 0.7 mg/dL (ref 0.44–1.00)
Glucose, Bld: 102 mg/dL — ABNORMAL HIGH (ref 70–99)
POTASSIUM: 4 mmol/L (ref 3.5–5.1)
SODIUM: 138 mmol/L (ref 135–145)

## 2018-05-23 LAB — CBG MONITORING, ED: GLUCOSE-CAPILLARY: 87 mg/dL (ref 70–99)

## 2018-05-23 LAB — I-STAT BETA HCG BLOOD, ED (MC, WL, AP ONLY)

## 2018-05-23 MED ORDER — SODIUM CHLORIDE 0.9 % IV BOLUS
1000.0000 mL | Freq: Once | INTRAVENOUS | Status: AC
  Administered 2018-05-23: 1000 mL via INTRAVENOUS

## 2018-05-23 NOTE — ED Notes (Signed)
I attempted to collect labs and the nurse attempted and we were unsuccessful

## 2018-05-23 NOTE — ED Triage Notes (Signed)
Patient BIB group home administrator, seen at psychiatrist this morning and told she was tachycardic and hypotensive. Caregiver reports she was going to follow up with PCP tomorrow but decided to bring her in tonight. Patient c/o weakness x1 week. Denies N/V/D, chest pain and SOB.

## 2018-05-23 NOTE — ED Provider Notes (Signed)
[facility]-EMERGENCY DEPT Provider Note   CSN: [id number] Arrival date & time: 05/23/18  1900     History   Chief Complaint Chief Complaint  Patient presents with  . Weakness    HPI Yvonne [name] is a 52 y.o. female.  HPI   Yvonne [name] is a 52 y.o. female, with a history of bipolar, presenting to the ED with generalized weakness for about the past week. She arrives with the director from her group home.  They both offer information about the patient's current complaint. She was placed on Prozac last week.  Since that time she has had decreased appetite and fatigue.  She has been eating and drinking less.  She thinks this is what is making her weaker. She was seen at [facility] this morning.  They chose to keep her medications as is, but told her she needed to go to the hospital because her pulse was high and her blood pressure was low.  Denies fever/chills, N/V/D, chest pain, shortness of breath, cough, abdominal pain, urinary symptoms, syncope, or any other complaints.  Past Medical History:  Diagnosis Date  . Bipolar 1 disorder ([facility])   . Medical history non-contributory   . Mental disorder   . No pertinent past medical history     Patient Active Problem List   Diagnosis Date Noted  . Severe bipolar I disorder, most recent episode depressed ([facility]) 06/01/2017  . Bipolar I disorder, current or most recent episode manic, with psychotic features ([facility])   . Affective psychosis, bipolar ([facility]) 01/05/2017  . Manic behavior ([facility])   . Facial cellulitis 02/10/2014    Past Surgical History:  Procedure Laterality Date  . NO PAST SURGERIES       OB History   None      Home Medications    Prior to Admission medications   Medication Sig Start Date End Date Taking? Authorizing Provider  acetaminophen (TYLENOL) 325 MG tablet Take 650 mg by mouth 3 (three) times daily.   Yes [provider]  ARIPiprazole (ABILIFY) 15 MG tablet Take 1 tablet (15  mg total) by mouth daily. For mood control 06/23/17  Yes Armandina Stammer I, NP  benztropine (COGENTIN) 0.5 MG tablet Take 1 tablet (0.5 mg total) by mouth daily. For prevention of drug induced tremors 06/23/17  Yes Nwoko, Nicole Kindred I, NP  FLUoxetine (PROZAC) 20 MG capsule Take 20 mg by mouth daily.   Yes [provider]  hydrOXYzine (ATARAX/VISTARIL) 25 MG tablet Take 25 mg by mouth 3 (three) times daily as needed for anxiety.   Yes [provider]  Multiple Vitamins-Minerals (MULTIVITAMIN WOMEN) TABS Take 1 tablet by mouth daily.   Yes [provider]  LORazepam (ATIVAN) 0.5 MG tablet Take 1 tablet (0.5 mg total) by mouth every 6 (six) hours as needed for anxiety. Patient not taking: Reported on 05/23/2018 06/22/17   [name], NP    Family History Family History  Problem Relation Age of Onset  . Depression Sister   . Depression Brother   . Depression Sister   . CAD Mother   . Hypertension Father   . Diabetes Father   . Bipolar disorder Cousin     Social History Social History   Tobacco Use  . Smoking status: Never Smoker  . Smokeless tobacco: Never Used  Substance Use Topics  . Alcohol use: No  . Drug use: No     Allergies   Patient has no known allergies.   Review  of Systems Review of Systems  Constitutional: Positive for fatigue. Negative for chills and fever.  Respiratory: Negative for cough and shortness of breath.   Cardiovascular: Negative for chest pain.  Gastrointestinal: Negative for abdominal pain, diarrhea, nausea and vomiting.  Genitourinary: Negative for dysuria, flank pain and hematuria.  Musculoskeletal: Negative for neck pain and neck stiffness.  Skin: Negative for rash.  Neurological: Positive for weakness (generalized). Negative for dizziness, syncope, light-headedness, numbness and headaches.  All other systems reviewed and are negative.    Physical Exam Updated Vital Signs BP 102/76   Pulse (!) 109   Temp 98.7 F  (37.1 C) (Oral)   Resp (!) 21   SpO2 100%   Physical Exam  Constitutional: She appears well-developed and well-nourished. No distress.  HENT:  Head: Normocephalic and atraumatic.  Eyes: Pupils are equal, round, and reactive to light. Conjunctivae and EOM are normal.  Neck: Neck supple.  Cardiovascular: Regular rhythm, normal heart sounds and intact distal pulses. Tachycardia present.  Pulmonary/Chest: Effort normal and breath sounds normal. No respiratory distress.  Abdominal: Soft. There is no tenderness. There is no guarding.  Musculoskeletal: She exhibits no edema.  Lymphadenopathy:    She has no cervical adenopathy.  Neurological: She is alert.  Sensation grossly intact to light touch in the extremities. Strength 5/5 in all extremities. No gait disturbance. Coordination intact. Cranial nerves III-XII grossly intact. No facial droop.   Skin: Skin is warm and dry. She is not diaphoretic.  Psychiatric: She has a normal mood and affect. Her behavior is normal.  Nursing note and vitals reviewed.    ED Treatments / Results  Labs (all labs ordered are listed, but only abnormal results are displayed) Labs Reviewed  BASIC METABOLIC PANEL - Abnormal; Notable for the following components:      Result Value   Glucose, Bld 102 (*)    Calcium 8.2 (*)    All other components within normal limits  CBC - Abnormal; Notable for the following components:   WBC 3.6 (*)    RBC 3.62 (*)    Hemoglobin 10.0 (*)    HCT 32.4 (*)    Platelets 141 (*)    nRBC 0.6 (*)    All other components within normal limits  URINALYSIS, ROUTINE W REFLEX MICROSCOPIC  CBG MONITORING, ED  I-STAT BETA HCG BLOOD, ED ([facility], [facility], [facility] ONLY)   Hemoglobin  Date Value Ref Range Status  05/23/2018 10.0 (L) 12.0 - 15.0 g/dL Final  16/04/9603 8.7 (L) 12.0 - 15.0 g/dL Final  54/03/8118 8.6 (L) 12.0 - 15.0 g/dL Final  14/78/2956 8.8 (L) 12.0 - 15.0 g/dL Final    EKG EKG Interpretation  Date/Time:  Tuesday May 23 2018 19:21:53 EST Ventricular Rate:  127 PR Interval:    QRS Duration: 66 QT Interval:  298 QTC Calculation: 434 R Axis:   29 Text Interpretation:  Sinus tachycardia Since last tracing rate faster Confirmed by Mancel Bale [phone]) on 05/23/2018 8:22:46 PM   Radiology No results found.  Procedures Procedures (including critical care time)  Medications Ordered in ED Medications  sodium chloride 0.9 % bolus 1,000 mL (has no administration in time range)  sodium chloride 0.9 % bolus 1,000 mL (1,000 mLs Intravenous New Bag/Given 05/23/18 2203)     Initial Impression / Assessment and Plan / ED Course  I have reviewed the triage vital signs and the nursing notes.  Pertinent labs & imaging results that were available during my care of the patient were  reviewed by me and considered in my medical decision making (see chart for details).     Patient presents with generalized weakness.  She has had poor appetite and poor oral intake since starting Prozac last week.  Suspect this has caused dehydration.  She has orthostatic changes.  Treated with IV fluids.  End of shift patient care handoff report given to Sharilyn SitesLisa Sanders, PA-C. Plan: Finish IV fluids.  Reassess patient.  Patient will follow-up with her PCP versus her psychiatry team for reevaluation of her medication regimen.   Vitals:   05/23/18 1911 05/23/18 2030 05/23/18 2043  BP: 103/70 102/76 102/76  Pulse: (!) 127  (!) 109  Resp: 19 (!) 22 (!) 21  Temp: 98.7 F (37.1 C)    TempSrc: Oral    SpO2: 98% 100% 100%   Orthostatic VS for the past 24 hrs:  BP- Lying Pulse- Lying BP- Sitting Pulse- Sitting BP- Standing at 0 minutes Pulse- Standing at 0 minutes  05/23/18 2158 94/66 108 105/69 125 95/73 141     Final Clinical Impressions(s) / ED Diagnoses   Final diagnoses:  None    ED Discharge Orders    None       Concepcion LivingJoy, Kaleesi Guyton C, PA-C 05/23/18 2310    Tegeler, Canary Brimhristopher J, MD 05/24/18 [phone]

## 2018-05-24 LAB — URINALYSIS, ROUTINE W REFLEX MICROSCOPIC
Bilirubin Urine: NEGATIVE
Glucose, UA: NEGATIVE mg/dL
HGB URINE DIPSTICK: NEGATIVE
KETONES UR: NEGATIVE mg/dL
Nitrite: NEGATIVE
Protein, ur: NEGATIVE mg/dL
Specific Gravity, Urine: 1.017 (ref 1.005–1.030)
pH: 5 (ref 5.0–8.0)

## 2018-05-24 NOTE — ED Notes (Signed)
Pt states she's unable to provide urine sample

## 2018-05-24 NOTE — ED Provider Notes (Signed)
Assumed care from PA Joy at shift change.  See prior notes for full H&P.  Briefly, 52 y.o. F here with generalized weakness for the past week.  Recently started on Prozac and since then has had decreased PO intake and fatigue.  [facility] and sent in with concern of low BP.  Labs thus far reassuring.  Did have some orthostatic changes so getting IVF.  Plan:  IVF infusing, will also check UA.  If reassuring, can d/c home with OP follow-up.  Results for orders placed or performed during the hospital encounter of 05/23/18  Basic metabolic panel  Result Value Ref Range   Sodium 138 135 - 145 mmol/L   Potassium 4.0 3.5 - 5.1 mmol/L   Chloride 102 98 - 111 mmol/L   CO2 28 22 - 32 mmol/L   Glucose, Bld 102 (H) 70 - 99 mg/dL   BUN 17 6 - 20 mg/dL   Creatinine, Ser 1.610.70 0.44 - 1.00 mg/dL   Calcium 8.2 (L) 8.9 - 10.3 mg/dL   GFR calc non Af Amer >60 >60 mL/min   GFR calc Af Amer >60 >60 mL/min   Anion gap 8 5 - 15  CBC  Result Value Ref Range   WBC 3.6 (L) 4.0 - 10.5 K/uL   RBC 3.62 (L) 3.87 - 5.11 MIL/uL   Hemoglobin 10.0 (L) 12.0 - 15.0 g/dL   HCT 09.632.4 (L) 04.536.0 - 40.946.0 %   MCV 89.5 80.0 - 100.0 fL   MCH 27.6 26.0 - 34.0 pg   MCHC 30.9 30.0 - 36.0 g/dL   RDW 81.114.4 91.411.5 - 78.215.5 %   Platelets 141 (L) 150 - 400 K/uL   nRBC 0.6 (H) 0.0 - 0.2 %  Urinalysis, Routine w reflex microscopic  Result Value Ref Range   Color, Urine YELLOW YELLOW   APPearance HAZY (A) CLEAR   Specific Gravity, Urine 1.017 1.005 - 1.030   pH 5.0 5.0 - 8.0   Glucose, UA NEGATIVE NEGATIVE mg/dL   Hgb urine dipstick NEGATIVE NEGATIVE   Bilirubin Urine NEGATIVE NEGATIVE   Ketones, ur NEGATIVE NEGATIVE mg/dL   Protein, ur NEGATIVE NEGATIVE mg/dL   Nitrite NEGATIVE NEGATIVE   Leukocytes, UA SMALL (A) NEGATIVE   RBC / HPF 0-5 0 - 5 RBC/hpf   WBC, UA 0-5 0 - 5 WBC/hpf   Bacteria, UA RARE (A) NONE SEEN   Squamous Epithelial / LPF 0-5 0 - 5   Mucus PRESENT    Hyaline Casts, UA PRESENT   CBG monitoring, ED  Result  Value Ref Range   Glucose-Capillary 87 70 - 99 mg/dL  I-Stat beta hCG blood, ED  Result Value Ref Range   I-stat hCG, quantitative <5.0 <5 mIU/mL   Comment 3           Dg Chest 2 View  Result Date: 05/10/2018 CLINICAL DATA:  Fever intermittently today. EXAM: CHEST - 2 VIEW COMPARISON:  None. FINDINGS: The heart size and mediastinal contours are within normal limits. Both lungs are clear. The visualized skeletal structures are unremarkable. IMPRESSION: No active cardiopulmonary disease. Electronically Signed   By: Elberta Fortisaniel  Boyle M.D.   On: 05/10/2018 18:51   Patient's UA without signs of infection.  BP has been stable-- low 100's systolic which seems about her baseline when compared with prior values.  Recommended she follow-up with her PCP/psychiatry team to discuss medications.  Return here for any new/acute changes.   Garlon HatchetSanders, Kendale Rembold M, PA-C 05/24/18 0244    Pollina,  Canary Brim, MD 05/24/18 [phone]

## 2018-05-24 NOTE — Discharge Instructions (Signed)
Labs today all looked good.  It did seem that you were a bit dehydrated which should improve with the IV fluids. Follow-up with your primary care doctor or your psychiatry team to discuss your medications. Return here for any new/acute changes.

## 2018-05-30 ENCOUNTER — Other Ambulatory Visit: Payer: Self-pay

## 2018-05-30 ENCOUNTER — Encounter (HOSPITAL_COMMUNITY): Payer: Self-pay

## 2018-05-30 ENCOUNTER — Emergency Department (HOSPITAL_COMMUNITY): Payer: Medicaid Other

## 2018-05-30 ENCOUNTER — Inpatient Hospital Stay (HOSPITAL_COMMUNITY)
Admission: EM | Admit: 2018-05-30 | Discharge: 2018-06-22 | DRG: 545 | Disposition: A | Discharge status: Skilled Nursing Facility | Payer: Medicaid Other | Attending: Internal Medicine | Admitting: Internal Medicine

## 2018-05-30 DIAGNOSIS — L89152 Pressure ulcer of sacral region, stage 2: Secondary | ICD-10-CM | POA: Diagnosis not present

## 2018-05-30 DIAGNOSIS — R945 Abnormal results of liver function studies: Secondary | ICD-10-CM

## 2018-05-30 DIAGNOSIS — J9811 Atelectasis: Secondary | ICD-10-CM | POA: Diagnosis present

## 2018-05-30 DIAGNOSIS — R64 Cachexia: Secondary | ICD-10-CM | POA: Diagnosis present

## 2018-05-30 DIAGNOSIS — R7401 Elevation of levels of liver transaminase levels: Secondary | ICD-10-CM | POA: Diagnosis present

## 2018-05-30 DIAGNOSIS — F319 Bipolar disorder, unspecified: Secondary | ICD-10-CM | POA: Diagnosis present

## 2018-05-30 DIAGNOSIS — R000 Tachycardia, unspecified: Secondary | ICD-10-CM | POA: Diagnosis present

## 2018-05-30 DIAGNOSIS — A419 Sepsis, unspecified organism: Secondary | ICD-10-CM | POA: Diagnosis not present

## 2018-05-30 DIAGNOSIS — I951 Orthostatic hypotension: Secondary | ICD-10-CM | POA: Diagnosis present

## 2018-05-30 DIAGNOSIS — R627 Adult failure to thrive: Secondary | ICD-10-CM | POA: Diagnosis present

## 2018-05-30 DIAGNOSIS — M359 Systemic involvement of connective tissue, unspecified: Secondary | ICD-10-CM | POA: Diagnosis present

## 2018-05-30 DIAGNOSIS — S0012XA Contusion of left eyelid and periocular area, initial encounter: Secondary | ICD-10-CM | POA: Diagnosis present

## 2018-05-30 DIAGNOSIS — F312 Bipolar disorder, current episode manic severe with psychotic features: Secondary | ICD-10-CM | POA: Diagnosis present

## 2018-05-30 DIAGNOSIS — Z8249 Family history of ischemic heart disease and other diseases of the circulatory system: Secondary | ICD-10-CM

## 2018-05-30 DIAGNOSIS — I10 Essential (primary) hypertension: Secondary | ICD-10-CM | POA: Diagnosis present

## 2018-05-30 DIAGNOSIS — R7989 Other specified abnormal findings of blood chemistry: Secondary | ICD-10-CM

## 2018-05-30 DIAGNOSIS — D696 Thrombocytopenia, unspecified: Secondary | ICD-10-CM | POA: Diagnosis not present

## 2018-05-30 DIAGNOSIS — R740 Nonspecific elevation of levels of transaminase and lactic acid dehydrogenase [LDH]: Secondary | ICD-10-CM

## 2018-05-30 DIAGNOSIS — Z79899 Other long term (current) drug therapy: Secondary | ICD-10-CM

## 2018-05-30 DIAGNOSIS — W19XXXA Unspecified fall, initial encounter: Secondary | ICD-10-CM | POA: Diagnosis present

## 2018-05-30 DIAGNOSIS — Z818 Family history of other mental and behavioral disorders: Secondary | ICD-10-CM

## 2018-05-30 DIAGNOSIS — D649 Anemia, unspecified: Secondary | ICD-10-CM | POA: Diagnosis not present

## 2018-05-30 DIAGNOSIS — R509 Fever, unspecified: Secondary | ICD-10-CM | POA: Diagnosis not present

## 2018-05-30 DIAGNOSIS — R590 Localized enlarged lymph nodes: Secondary | ICD-10-CM | POA: Diagnosis present

## 2018-05-30 DIAGNOSIS — R636 Underweight: Secondary | ICD-10-CM | POA: Diagnosis not present

## 2018-05-30 DIAGNOSIS — E860 Dehydration: Secondary | ICD-10-CM | POA: Diagnosis present

## 2018-05-30 DIAGNOSIS — E43 Unspecified severe protein-calorie malnutrition: Secondary | ICD-10-CM | POA: Diagnosis present

## 2018-05-30 DIAGNOSIS — M329 Systemic lupus erythematosus, unspecified: Secondary | ICD-10-CM | POA: Diagnosis present

## 2018-05-30 DIAGNOSIS — R935 Abnormal findings on diagnostic imaging of other abdominal regions, including retroperitoneum: Secondary | ICD-10-CM | POA: Diagnosis not present

## 2018-05-30 DIAGNOSIS — Z9114 Patient's other noncompliance with medication regimen: Secondary | ICD-10-CM | POA: Diagnosis not present

## 2018-05-30 DIAGNOSIS — Z681 Body mass index (BMI) 19 or less, adult: Secondary | ICD-10-CM

## 2018-05-30 DIAGNOSIS — E876 Hypokalemia: Secondary | ICD-10-CM | POA: Diagnosis present

## 2018-05-30 DIAGNOSIS — L8990 Pressure ulcer of unspecified site, unspecified stage: Secondary | ICD-10-CM

## 2018-05-30 DIAGNOSIS — D61818 Other pancytopenia: Secondary | ICD-10-CM | POA: Diagnosis present

## 2018-05-30 DIAGNOSIS — Z888 Allergy status to other drugs, medicaments and biological substances status: Secondary | ICD-10-CM | POA: Diagnosis not present

## 2018-05-30 DIAGNOSIS — Z833 Family history of diabetes mellitus: Secondary | ICD-10-CM

## 2018-05-30 LAB — CBC
HCT: 27.5 % — ABNORMAL LOW (ref 36.0–46.0)
Hemoglobin: 8.6 g/dL — ABNORMAL LOW (ref 12.0–15.0)
MCH: 27.5 pg (ref 26.0–34.0)
MCHC: 31.3 g/dL (ref 30.0–36.0)
MCV: 87.9 fL (ref 80.0–100.0)
NRBC: 0.5 % — AB (ref 0.0–0.2)
PLATELETS: 71 10*3/uL — AB (ref 150–400)
RBC: 3.13 MIL/uL — ABNORMAL LOW (ref 3.87–5.11)
RDW: 14.9 % (ref 11.5–15.5)
WBC: 3.7 10*3/uL — AB (ref 4.0–10.5)

## 2018-05-30 LAB — COMPREHENSIVE METABOLIC PANEL
ALBUMIN: 2.3 g/dL — AB (ref 3.5–5.0)
ALT: 62 U/L — ABNORMAL HIGH (ref 0–44)
AST: 393 U/L — ABNORMAL HIGH (ref 15–41)
Alkaline Phosphatase: 294 U/L — ABNORMAL HIGH (ref 38–126)
Anion gap: 7 (ref 5–15)
BUN: 15 mg/dL (ref 6–20)
CHLORIDE: 106 mmol/L (ref 98–111)
CO2: 25 mmol/L (ref 22–32)
Calcium: 7.7 mg/dL — ABNORMAL LOW (ref 8.9–10.3)
Creatinine, Ser: 0.61 mg/dL (ref 0.44–1.00)
GFR calc Af Amer: >60 mL/min (ref >60)
GFR calc non Af Amer: >60 mL/min (ref >60)
GLUCOSE: 90 mg/dL (ref 70–99)
Potassium: 4.2 mmol/L (ref 3.5–5.1)
SODIUM: 138 mmol/L (ref 135–145)
Total Bilirubin: 0.5 mg/dL (ref 0.3–1.2)
Total Protein: 7.1 g/dL (ref 6.5–8.1)

## 2018-05-30 LAB — LACTIC ACID, PLASMA: LACTIC ACID, VENOUS: 1 mmol/L (ref 0.5–1.9)

## 2018-05-30 LAB — LIPASE, BLOOD: LIPASE: 61 U/L — AB (ref 11–51)

## 2018-05-30 LAB — ETHANOL: Alcohol, Ethyl (B): <10 mg/dL (ref <10)

## 2018-05-30 LAB — I-STAT BETA HCG BLOOD, ED (MC, WL, AP ONLY): I-stat hCG, quantitative: <5 m[IU]/mL (ref <5)

## 2018-05-30 LAB — ACETAMINOPHEN LEVEL: Acetaminophen (Tylenol), Serum: <10 ug/mL — ABNORMAL LOW (ref 10–30)

## 2018-05-30 LAB — PROCALCITONIN: PROCALCITONIN: 0.59 ng/mL

## 2018-05-30 MED ORDER — ARIPIPRAZOLE 5 MG PO TABS
15.0000 mg | ORAL_TABLET | Freq: Every day | ORAL | Status: DC
  Administered 2018-05-31 – 2018-06-22 (×22): 15 mg via ORAL
  Filled 2018-05-30 (×23): qty 1

## 2018-05-30 MED ORDER — SODIUM CHLORIDE 0.9 % IV BOLUS
2000.0000 mL | Freq: Once | INTRAVENOUS | Status: AC
  Administered 2018-05-30: 2000 mL via INTRAVENOUS

## 2018-05-30 MED ORDER — DEXTROSE-NACL 5-0.9 % IV SOLN
INTRAVENOUS | Status: DC
  Administered 2018-05-30 – 2018-06-07 (×12): via INTRAVENOUS

## 2018-05-30 MED ORDER — IOPAMIDOL (ISOVUE-300) INJECTION 61%
INTRAVENOUS | Status: AC
  Filled 2018-05-30: qty 100

## 2018-05-30 MED ORDER — ESCITALOPRAM OXALATE 10 MG PO TABS
5.0000 mg | ORAL_TABLET | Freq: Every day | ORAL | Status: DC
  Administered 2018-05-31 – 2018-06-19 (×19): 5 mg via ORAL
  Filled 2018-05-30 (×19): qty 1

## 2018-05-30 MED ORDER — ACETAMINOPHEN 325 MG PO TABS
650.0000 mg | ORAL_TABLET | Freq: Four times a day (QID) | ORAL | Status: DC | PRN
  Administered 2018-05-30: 650 mg via ORAL
  Filled 2018-05-30: qty 2

## 2018-05-30 MED ORDER — POLYETHYLENE GLYCOL 3350 17 G PO PACK: 17.0000 g | PACK | Freq: Every day | ORAL | Status: DC | PRN

## 2018-05-30 MED ORDER — ENOXAPARIN SODIUM 40 MG/0.4ML ~~LOC~~ SOLN: 40.0000 mg | SUBCUTANEOUS | Status: DC

## 2018-05-30 MED ORDER — SODIUM CHLORIDE (PF) 0.9 % IJ SOLN
INTRAMUSCULAR | Status: AC
  Filled 2018-05-30: qty 50

## 2018-05-30 MED ORDER — ENSURE ENLIVE PO LIQD
237.0000 mL | Freq: Two times a day (BID) | ORAL | Status: DC
  Administered 2018-06-03 – 2018-06-22 (×19): 237 mL via ORAL

## 2018-05-30 MED ORDER — ONDANSETRON HCL 4 MG/2ML IJ SOLN: 4.0000 mg | Freq: Four times a day (QID) | INTRAMUSCULAR | Status: DC | PRN

## 2018-05-30 MED ORDER — ACETAMINOPHEN 650 MG RE SUPP: 650.0000 mg | Freq: Four times a day (QID) | RECTAL | Status: DC | PRN

## 2018-05-30 MED ORDER — CALCIUM GLUCONATE-NACL 1-0.675 GM/50ML-% IV SOLN
1.0000 g | Freq: Once | INTRAVENOUS | Status: AC
  Administered 2018-05-30: 1000 mg via INTRAVENOUS
  Filled 2018-05-30: qty 50

## 2018-05-30 MED ORDER — IOPAMIDOL (ISOVUE-300) INJECTION 61%
100.0000 mL | Freq: Once | INTRAVENOUS | Status: AC | PRN
  Administered 2018-05-30: 100 mL via INTRAVENOUS

## 2018-05-30 MED ORDER — HYDROXYZINE HCL 25 MG PO TABS
25.0000 mg | ORAL_TABLET | Freq: Three times a day (TID) | ORAL | Status: DC | PRN
  Administered 2018-06-05: 25 mg via ORAL
  Filled 2018-05-30: qty 1

## 2018-05-30 MED ORDER — ONDANSETRON HCL 4 MG PO TABS: 4.0000 mg | ORAL_TABLET | Freq: Four times a day (QID) | ORAL | Status: DC | PRN

## 2018-05-30 MED ORDER — BENZTROPINE MESYLATE 0.5 MG PO TABS
0.5000 mg | ORAL_TABLET | Freq: Every day | ORAL | Status: DC
  Administered 2018-05-31: 0.5 mg via ORAL
  Filled 2018-05-30: qty 1

## 2018-05-30 NOTE — H&P (Signed)
History and Physical  Yvonne [name] XBM:[id number] DOB: [birth year] DOA: 05/30/2018  Referring physician: Azalia BilisKevin Campos, ER physician PCP: Kaleen MaskElkins, Wilson Oliver, MD  Outpatient Specialists: None Patient coming from: Group home & is able to ambulate without assistance  Chief Complaint: Weakness and poor p.o. intake  HPI: Yvonne [name] is a 52 y.o. female with medical history significant for bipolar disorder who resides in a group home and presented to the emergency room after having a fall following lightheadedness.  In the emergency room, noted to have significant orthostatic hypotension.White count normal, although patient noted to have some mild transaminitis.  Mildly elevated lipase level although she does not report any abdominal pain.  Initially no evidence of fever.  Chest x-ray unremarkable.  Abdominal CT also unrevealing.  Hospitalist were called for further evaluation.  Following admission to the floor, patient spiked a fever of 101.4, remained tachycardic.  Lactic acid and procalcitonin level ordered.  Review of Systems: Patient seen after arrival to floor. Pt very withdrawn, poor historian.  She just states that she does not feel good.  She does not really go into more detail than that even when asked.  Pt denies any shortness of breath, dysuria.  When asked about pain, she states she only hurts all over when she is moved.  Unable to get a good review of systems from her beyond this..     Past Medical History:  Diagnosis Date  . Bipolar 1 disorder (HCC)   . Medical history non-contributory   . Mental disorder   . No pertinent past medical history    Past Surgical History:  Procedure Laterality Date  . NO PAST SURGERIES      Social History:  reports that she has never smoked. She has never used smokeless tobacco. She reports that she does not drink alcohol or use drugs.  Resides in a group home.  Ambulates without assistance.   No Known Allergies  Family History    Problem Relation Age of Onset  . Depression Sister   . Depression Brother   . Depression Sister   . CAD Mother   . Hypertension Father   . Diabetes Father   . Bipolar disorder Cousin       Prior to Admission medications   Medication Sig Start Date End Date Taking? Authorizing Provider  acetaminophen (TYLENOL) 325 MG tablet Take 650 mg by mouth 3 (three) times daily.   Yes [provider]  ARIPiprazole (ABILIFY) 15 MG tablet Take 1 tablet (15 mg total) by mouth daily. For mood control 06/23/17  Yes Armandina StammerNwoko, Agnes I, NP  benztropine (COGENTIN) 0.5 MG tablet Take 1 tablet (0.5 mg total) by mouth daily. For prevention of drug induced tremors 06/23/17  Yes Nwoko, Agnes I, NP  escitalopram (LEXAPRO) 5 MG tablet Take 5 mg by mouth daily.   Yes [provider]  hydrOXYzine (ATARAX/VISTARIL) 25 MG tablet Take 25 mg by mouth 3 (three) times daily as needed for anxiety.   Yes [provider]  sennosides-docusate sodium (SENOKOT-S) 8.6-50 MG tablet Take 2 tablets by mouth at bedtime as needed for constipation.   Yes [provider]  Vitamin D, Ergocalciferol, (DRISDOL) 1.25 MG (50000 UT) CAPS capsule Take 50,000 Units by mouth every 14 (fourteen) days.   Yes [provider]    Physical Exam: BP 109/75   Pulse (!) 111   Temp (!) 101.4 F (38.6 C) (Oral)   Resp 18   Ht 5\' 2"  (1.575 m)   Wt 46.3  kg   SpO2 100%   BMI 18.66 kg/m    General: Awake, oriented x2 Eyes: Sclera nonicteric ENT: Normocephalic, noted periorbital hematoma Neck: Supple, no JVD Cardiovascular: Regular rhythm, tachycardic Respiratory: Clear to auscultation bilaterally Abdomen: Soft, nontender, nondistended, hypoactive bowel sounds Skin: No skin breaks, tears or lesions Musculoskeletal: No clubbing or cyanosis or edema Psychiatric: Flat affect, no evidence of acute psychoses Neurologic: No focal deficits          Labs on Admission:  Basic Metabolic Panel: Recent Labs   Lab 05/23/18 2030 05/30/18 1010  NA 138 138  K 4.0 4.2  CL 102 106  CO2 28 25  GLUCOSE 102* 90  BUN 17 15  CREATININE 0.70 0.61  CALCIUM 8.2* 7.7*   Liver Function Tests: Recent Labs  Lab 05/30/18 1010  AST 393*  ALT 62*  ALKPHOS 294*  BILITOT 0.5  PROT 7.1  ALBUMIN 2.3*   Recent Labs  Lab 05/30/18 1010  LIPASE 61*   No results for input(s): AMMONIA in the last 168 hours. CBC: Recent Labs  Lab 05/23/18 2030 05/30/18 1010  WBC 3.6* 3.7*  HGB 10.0* 8.6*  HCT 32.4* 27.5*  MCV 89.5 87.9  PLT 141* 71*   Cardiac Enzymes: No results for input(s): CKTOTAL, CKMB, CKMBINDEX, TROPONINI in the last 168 hours.  BNP (last 3 results) No results for input(s): BNP in the last 8760 hours.  ProBNP (last 3 results) No results for input(s): PROBNP in the last 8760 hours.  CBG: Recent Labs  Lab 05/23/18 2041  GLUCAP 87    Radiological Exams on Admission: Dg Chest 2 View  Result Date: 05/30/2018 CLINICAL DATA:  Mental status change, possible fall, forehead hematoma, intermittent urinary incompetence. Generalized weakness. EXAM: CHEST - 2 VIEW COMPARISON:  PA and lateral chest x-ray of May 10, 2018 FINDINGS: The lungs are well-expanded. There is no focal infiltrate. There is no pleural effusion. The heart and pulmonary vascularity are normal. The mediastinum is normal in width. The bony thorax exhibits no acute abnormality. IMPRESSION: There is no active cardiopulmonary disease. Electronically Signed   By: David  [place] M.D.   On: 05/30/2018 09:54   Ct Head Wo Contrast  Result Date: 05/30/2018 CLINICAL DATA:  Generalized weakness, recent fall EXAM: CT HEAD WITHOUT CONTRAST TECHNIQUE: Contiguous axial images were obtained from the base of the skull through the vertex without intravenous contrast. COMPARISON:  None. FINDINGS: Brain: No evidence of acute infarction, hemorrhage, hydrocephalus, extra-axial collection or mass lesion/mass effect. Vascular: No hyperdense vessel  or unexpected calcification. Skull: Normal. Negative for fracture or focal lesion. Sinuses/Orbits: Mild partial opacification of the right frontal sinus and bilateral sphenoid sinuses. Mastoid air cells are clear. Other: Small extracranial hematoma overlying the left frontal bone (series 2/image 11). IMPRESSION: Small extracranial hematoma overlying the left frontal bone. No evidence of acute intracranial abnormality. Electronically Signed   By: Charline Bills M.D.   On: 05/30/2018 10:56   Ct Abdomen Pelvis W Contrast  Result Date: 05/30/2018 CLINICAL DATA:  Elevated liver function tests. Adult failure to thrive. EXAM: CT ABDOMEN AND PELVIS WITH CONTRAST TECHNIQUE: Multidetector CT imaging of the abdomen and pelvis was performed using the standard protocol following bolus administration of intravenous contrast. CONTRAST:  ISOVUE-300 IOPAMIDOL (ISOVUE-300) INJECTION 61% COMPARISON:  CT scan of April 04, 2010. FINDINGS: Lower chest: No acute abnormality. Hepatobiliary: No focal liver abnormality is seen. No gallstones, gallbladder wall thickening, or biliary dilatation. Pancreas: Unremarkable. No pancreatic ductal dilatation or surrounding inflammatory changes. Spleen:  Normal in size without focal abnormality. Adrenals/Urinary Tract: Adrenal glands are unremarkable. Kidneys are normal, without renal calculi, focal lesion, or hydronephrosis. Bladder is unremarkable. Stomach/Bowel: The stomach appears normal. There is no evidence of bowel obstruction or inflammation. The appendix is not visualized. Vascular/Lymphatic: Mildly enlarged mesenteric and retroperitoneal adenopathy is noted, all of which are less than 1 cm in size, and most likely are inflammatory in etiology. Reproductive: Multiple small uterine fibroids are noted. No adnexal abnormality is noted. Other: No abdominal wall hernia or abnormality. No abdominopelvic ascites. Musculoskeletal: No acute or significant osseous findings. IMPRESSION:  Mildly enlarged mesenteric and retroperitoneal adenopathy is now noted, most likely inflammatory or reactive in etiology. Multiple small uterine fibroids are noted. Electronically Signed   By: Lupita Raider, M.D.   On: 05/30/2018 14:26    EKG: Not done  Assessment/Plan Present on Admission: . Orthostatic hypotension . Bipolar I disorder, current or most recent episode manic, with psychotic features (HCC) . Severe protein-calorie malnutrition (HCC) . Hypocalcemia . Transaminitis  Principal Problem:   Orthostatic hypotension: Dehydration from poor p.o. intake and/or signs of sepsis.  See below.  Aggressively fluid resuscitating. Active Problems:   Bipolar I disorder, current or most recent episode manic, with psychotic features (HCC)   Severe protein-calorie malnutrition (HCC): More of a chronic issue.  Nutrition to see.  Suspect poor appetite secondary to her mental health issues.  Have started Ensure.    Hypocalcemia: 1 amp of calcium gluconate given.  Probably from poor p.o. intake in general.  Thrombocytopenia: Unclear etiology.  Lab work from a week ago noted platelets at 141, now at 80.  SCDs and no anticoagulation.  No active evidence of bleeding. ?Early sign of systemic infection.  Monitor closely.    Transaminitis: Unclear etiology.  Suspect mild shock liver from hypotension.  Repeat labs in the morning following fluids.    Fever: New occurrence following admission.  Patient spiked a fever of 101.4, remained tachycardic.  It is possible that the fever may be from the Abilify or Cogentin, but certainly concerning for the possibility of sepsis.  Patient denies any respiratory complaints and chest x-ray unrevealing.  CT scan of abdomen also unrevealing.  Have ordered procalcitonin level, lactic acid level and urinalysis.  Have increased IV fluids to 125 cc/hr.  If lactic acid level elevated, will check blood cultures and start broad-spectrum antibiotics.   DVT prophylaxis:  SCDs  Code Status: Full code  Family Communication: No family  Disposition Plan: Continue in hospital, until confirm no infection, platelets stabilized  Consults called: None  Admission status: Initially placed as observation when thought that she only had orthostatic hypotension, but now with fever and thrombocytopenia, will go ahead and change to inpatient.    Hollice Espy MD [facility] Pager [phone]  If 7PM-7AM, please contact night-coverage [email or web address] Password [facility]  05/30/2018, 7:43 PM

## 2018-05-30 NOTE — Progress Notes (Signed)
   05/30/18 1743  MEWS Score  Resp 18  Pulse Rate (!) 111  BP 109/75  Temp (!) 101.4 F (38.6 C)  SpO2 100 %  O2 Device Room Air  MEWS Score  MEWS RR 0  MEWS Pulse 2  MEWS Systolic 0  MEWS LOC 0  MEWS Temp 1  MEWS Score 3  MEWS Score Color Yellow  MEWS Assessment  Is this an acute change? Yes  MEWS guidelines implemented *See Row Information* Yellow  Rapid Response Notification  Name of Rapid Response RN Notified Christian RN  Date Rapid Response Notified 05/30/18  Time Rapid Response Notified 1819  Provider Notification  Provider Name/Title Rito EhrlichKrishnan MD  Date Provider Notified 05/30/18  Time Provider Notified 1821  Notification Type Page  Notification Reason Change in status  Response No new orders  Date of Provider Response 05/30/18  Time of Provider Response 1822   Pt is in the bed and at baseline. Pt given tylenol per MAR. Will continue to monitor.

## 2018-05-30 NOTE — ED Provider Notes (Signed)
[facility] DEPT Provider Note   CSN: [id number] Arrival date & time: 05/30/18  1660     History   Chief Complaint Chief Complaint  Patient presents with  . Fall  . Weakness  . Dysuria    HPI Yvonne [name] is a 52 y.o. female.  HPI 52 year old female who resides in a nursing home secondary to her bipolar disorder and history of medication noncompliance presents the emergency department with increasing generalized weakness and decreased oral intake.  Reported lightheadedness when standing with a fall and left periorbital hematoma noted.  Patient reports nausea and reports feeling as though when she eats she quickly becomes full.  She denies vomiting.  Denies diarrhea.  States she feels weak all over.  Feels like she is losing weight.  She presents from EMS tachycardic with dry chapped lips   Past Medical History:  Diagnosis Date  . Bipolar 1 disorder ([place])   . Medical history non-contributory   . Mental disorder   . No pertinent past medical history     Patient Active Problem List   Diagnosis Date Noted  . Severe bipolar I disorder, most recent episode depressed ([place]) 06/01/2017  . Bipolar I disorder, current or most recent episode manic, with psychotic features ([place])   . Affective psychosis, bipolar ([place]) 01/05/2017  . Manic behavior ([place])   . Facial cellulitis 02/10/2014    Past Surgical History:  Procedure Laterality Date  . NO PAST SURGERIES       OB History   None      Home Medications    Prior to Admission medications   Medication Sig Start Date End Date Taking? Authorizing Provider  acetaminophen (TYLENOL) 325 MG tablet Take 650 mg by mouth 3 (three) times daily.   Yes [provider]  ARIPiprazole (ABILIFY) 15 MG tablet Take 1 tablet (15 mg total) by mouth daily. For mood control 06/23/17  Yes Lindell Spar I, NP  benztropine (COGENTIN) 0.5 MG tablet Take 1 tablet (0.5 mg total) by mouth daily. For prevention of  drug induced tremors 06/23/17  Yes Nwoko, Agnes I, NP  escitalopram (LEXAPRO) 5 MG tablet Take 5 mg by mouth daily.   Yes [provider]  hydrOXYzine (ATARAX/VISTARIL) 25 MG tablet Take 25 mg by mouth 3 (three) times daily as needed for anxiety.   Yes [provider]  sennosides-docusate sodium (SENOKOT-S) 8.6-50 MG tablet Take 2 tablets by mouth at bedtime as needed for constipation.   Yes [provider]  Vitamin D, Ergocalciferol, (DRISDOL) 1.25 MG (50000 UT) CAPS capsule Take 50,000 Units by mouth every 14 (fourteen) days.   Yes [provider]  LORazepam (ATIVAN) 0.5 MG tablet Take 1 tablet (0.5 mg total) by mouth every 6 (six) hours as needed for anxiety. Patient not taking: Reported on 05/23/2018 06/22/17   Encarnacion Slates, NP    Family History Family History  Problem Relation Age of Onset  . Depression Sister   . Depression Brother   . Depression Sister   . CAD Mother   . Hypertension Father   . Diabetes Father   . Bipolar disorder Cousin     Social History Social History   Tobacco Use  . Smoking status: Never Smoker  . Smokeless tobacco: Never Used  Substance Use Topics  . Alcohol use: No  . Drug use: No     Allergies   Patient has no known allergies.   Review of Systems Review of Systems  All other  systems reviewed and are negative.    Physical Exam Updated Vital Signs BP 100/69   Pulse (!) 108   Temp 98.8 F (37.1 C) (Oral)   Resp 16   Ht _0  (1.575 m)   Wt 46.3 kg   SpO2 98%   BMI 18.66 kg/m   Physical Exam  Constitutional: She is oriented to person, place, and time. No distress.  Cachectic.  Dry lips and mouth  HENT:  Head: Normocephalic.  Dry mucous membranes.  Eyes: EOM are normal.  Neck: Normal range of motion.  Cardiovascular: Regular rhythm and normal heart sounds.  Tachycardia  Pulmonary/Chest: Effort normal and breath sounds normal.  Abdominal: Soft. She exhibits no distension. There is no  tenderness.  Musculoskeletal: Normal range of motion.  Neurological: She is alert and oriented to person, place, and time.  Skin: Skin is warm and dry.  Psychiatric: She has a normal mood and affect. Judgment normal.  Nursing note and vitals reviewed.    ED Treatments / Results  Labs (all labs ordered are listed, but only abnormal results are displayed) Labs Reviewed  CBC - Abnormal; Notable for the following components:      Result Value   WBC 3.7 (*)    RBC 3.13 (*)    Hemoglobin 8.6 (*)    HCT 27.5 (*)    Platelets 71 (*)    nRBC 0.5 (*)    All other components within normal limits  COMPREHENSIVE METABOLIC PANEL - Abnormal; Notable for the following components:   Calcium 7.7 (*)    Albumin 2.3 (*)    AST 393 (*)    ALT 62 (*)    Alkaline Phosphatase 294 (*)    All other components within normal limits  LIPASE, BLOOD - Abnormal; Notable for the following components:   Lipase 61 (*)    All other components within normal limits  ETHANOL  RAPID URINE DRUG SCREEN, HOSP PERFORMED  URINALYSIS, ROUTINE W REFLEX MICROSCOPIC  I-STAT BETA HCG BLOOD, ED ([facility], [facility], [facility] ONLY)   ALT  Date Value Ref Range Status  05/30/2018 62 (H) 0 - 44 U/L Final  05/10/2018 32 0 - 44 U/L Final  05/09/2018 33 0 - 44 U/L Final  05/12/2017 42 14 - 54 U/L Final    AST  Date Value Ref Range Status  05/30/2018 393 (H) 15 - 41 U/L Final  05/10/2018 109 (H) 15 - 41 U/L Final  05/09/2018 113 (H) 15 - 41 U/L Final  05/12/2017 30 15 - 41 U/L Final   Hemoglobin  Date Value Ref Range Status  05/30/2018 8.6 (L) 12.0 - 15.0 g/dL Final  05/23/2018 10.0 (L) 12.0 - 15.0 g/dL Final  05/10/2018 8.7 (L) 12.0 - 15.0 g/dL Final  05/10/2018 8.6 (L) 12.0 - 15.0 g/dL Final    EKG None  Radiology Dg Chest 2 View  Result Date: 05/30/2018 CLINICAL DATA:  Mental status change, possible fall, forehead hematoma, intermittent urinary incompetence. Generalized weakness. EXAM: CHEST - 2 VIEW COMPARISON:  PA and  lateral chest x-ray of May 10, 2018 FINDINGS: The lungs are well-expanded. There is no focal infiltrate. There is no pleural effusion. The heart and pulmonary vascularity are normal. The mediastinum is normal in width. The bony thorax exhibits no acute abnormality. IMPRESSION: There is no active cardiopulmonary disease. Electronically Signed   By: David  Martinique M.D.   On: 05/30/2018 09:54   Ct Head Wo Contrast  Result Date: 05/30/2018 CLINICAL DATA:  Generalized weakness, recent  fall EXAM: CT HEAD WITHOUT CONTRAST TECHNIQUE: Contiguous axial images were obtained from the base of the skull through the vertex without intravenous contrast. COMPARISON:  None. FINDINGS: Brain: No evidence of acute infarction, hemorrhage, hydrocephalus, extra-axial collection or mass lesion/mass effect. Vascular: No hyperdense vessel or unexpected calcification. Skull: Normal. Negative for fracture or focal lesion. Sinuses/Orbits: Mild partial opacification of the right frontal sinus and bilateral sphenoid sinuses. Mastoid air cells are clear. Other: Small extracranial hematoma overlying the left frontal bone (series 2/image 11). IMPRESSION: Small extracranial hematoma overlying the left frontal bone. No evidence of acute intracranial abnormality. Electronically Signed   By: Julian Hy M.D.   On: 05/30/2018 10:56   Ct Abdomen Pelvis W Contrast  Result Date: 05/30/2018 CLINICAL DATA:  Elevated liver function tests. Adult failure to thrive. EXAM: CT ABDOMEN AND PELVIS WITH CONTRAST TECHNIQUE: Multidetector CT imaging of the abdomen and pelvis was performed using the standard protocol following bolus administration of intravenous contrast. CONTRAST:  130m ISOVUE-300 IOPAMIDOL (ISOVUE-300) INJECTION 61% COMPARISON:  CT scan of April 04, 2010. FINDINGS: Lower chest: No acute abnormality. Hepatobiliary: No focal liver abnormality is seen. No gallstones, gallbladder wall thickening, or biliary dilatation. Pancreas:  Unremarkable. No pancreatic ductal dilatation or surrounding inflammatory changes. Spleen: Normal in size without focal abnormality. Adrenals/Urinary Tract: Adrenal glands are unremarkable. Kidneys are normal, without renal calculi, focal lesion, or hydronephrosis. Bladder is unremarkable. Stomach/Bowel: The stomach appears normal. There is no evidence of bowel obstruction or inflammation. The appendix is not visualized. Vascular/Lymphatic: Mildly enlarged mesenteric and retroperitoneal adenopathy is noted, all of which are less than 1 cm in size, and most likely are inflammatory in etiology. Reproductive: Multiple small uterine fibroids are noted. No adnexal abnormality is noted. Other: No abdominal wall hernia or abnormality. No abdominopelvic ascites. Musculoskeletal: No acute or significant osseous findings. IMPRESSION: Mildly enlarged mesenteric and retroperitoneal adenopathy is now noted, most likely inflammatory or reactive in etiology. Multiple small uterine fibroids are noted. Electronically Signed   By: JMarijo Conception M.D.   On: 05/30/2018 14:26    Procedures Procedures (including critical care time)  Medications Ordered in ED Medications  iopamidol (ISOVUE-300) 61 % injection (has no administration in time range)  sodium chloride (PF) 0.9 % injection (has no administration in time range)  sodium chloride 0.9 % bolus 2,000 mL (0 mLs Intravenous Stopped 05/30/18 1141)  iopamidol (ISOVUE-300) 61 % injection 100 mL (100 mLs Intravenous Contrast Given 05/30/18 1401)     Initial Impression / Assessment and Plan / ED Course  I have reviewed the triage vital signs and the nursing notes.  Pertinent labs & imaging results that were available during my care of the patient were reviewed by me and considered in my medical decision making (see chart for details).     Nonspecific worsening LFTs now with an AST of 393 and ALT of 62.  Alk phos is elevated to 94.  CT imaging without acute  intra-abdominal pathology except for nonspecific intraperitoneal adenopathy.  Worsening hypocalcemia.  Worsening generalized weakness.  Orthostatic vital signs.  Remains tachycardic after 2 L IV fluids.  Patient will benefit from acute hospitalization for ongoing medical work-up for adult failure to thrive type picture  Final Clinical Impressions(s) / ED Diagnoses   Final diagnoses:  Adult failure to thrive  Hypocalcemia  Acute dehydration  Elevated liver function tests    ED Discharge Orders    None       CJola Schmidt MD 05/30/18 1530

## 2018-05-30 NOTE — ED Notes (Signed)
Bed: [id number] Expected date: 05/30/18 Expected time: 8:43 AM Means of arrival: Ambulance Comments: EMS-fever

## 2018-05-30 NOTE — ED Notes (Signed)
ED TO INPATIENT HANDOFF REPORT  Name/Age/Gender Yvonne [name] 52 y.o. female  Code Status Code Status History    Date Active Date Inactive Code Status Order ID Comments User Context   05/09/2018 1557 05/11/2018 1246 Full Code [id number]  Merrily Pew, MD ED   06/01/2017 1922 06/22/2017 1920 Full Code [id number]  Consuello Closs, NP Inpatient   05/11/2017 1905 06/01/2017 1603 Full Code [id number]  Street, [place], [place] ED   05/07/2017 0712 05/08/2017 1435 Full Code [id number]  Jeannett Senior, PA-C ED   04/22/2017 2243 05/02/2017 1642 Full Code [id number]  Lorelle Gibbs ED   04/07/2017 1800 04/08/2017 0209 Full Code [id number]  Dalia Heading, PA-C ED   02/13/2017 2327 02/17/2017 1303 Full Code [id number]  Charlesetta Shanks, MD ED   01/25/2017 0204 01/25/2017 1354 Full Code [id number]  Nona Dell, PA-C ED   01/05/2017 1652 01/14/2017 1646 Full Code [id number]  Ethelene Hal, NP Inpatient   01/05/2017 1651 01/05/2017 1652 Full Code [id number]  Ethelene Hal, NP Inpatient   01/02/2017 0616 01/05/2017 1642 Full Code [id number]  Antonietta Breach, PA-C ED   11/29/2016 1953 12/01/2016 1234 Full Code [id number]  Varney Biles, MD ED   11/25/2016 0042 11/25/2016 0507 Full Code [id number]  Junius Creamer, NP ED   02/10/2014 0118 02/10/2014 1737 Full Code [id number]  Bernadene Bell, MD Inpatient   04/24/2013 1936 04/25/2013 0212 Full Code [id number]  Dewaine Oats, PA-C ED   12/31/2012 2257 01/01/2013 1855 Full Code [id number]  Pollina, Gwenyth Allegra., MD ED      Home/SNF/Other Home  Chief Complaint Fall; Weakness; Dysuria  Level of Care/Admitting Diagnosis ED Disposition    ED Disposition Condition Comment   Admit  Hospital Area: [facility] [[id number]]  Level of Care: Telemetry [5]  Admit to tele based on following criteria: Eval of Syncope  Diagnosis: Orthostatic hypotension [458.0.ICD-9-CM]  Admitting Physician: Junius Argyle  Attending  Physician: Junius Argyle  PT Class (Do Not Modify): Observation [104]  PT Acc Code (Do Not Modify): Observation [10022]       Medical History Past Medical History:  Diagnosis Date  . Bipolar 1 disorder ([place])   . Medical history non-contributory   . Mental disorder   . No pertinent past medical history     Allergies No Known Allergies  IV Location/Drains/Wounds Patient Lines/Drains/Airways Status   Active Line/Drains/Airways    Name:   Placement date:   Placement time:   Site:   Days:   Peripheral IV 05/30/18 Left Antecubital   05/30/18    0856    Antecubital   less than 1          Labs/Imaging Results for orders placed or performed during the hospital encounter of 05/30/18 (from the past 48 hour(s))  CBC     Status: Abnormal   Collection Time: 05/30/18 10:10 AM  Result Value Ref Range   WBC 3.7 (L) 4.0 - 10.5 K/uL   RBC 3.13 (L) 3.87 - 5.11 MIL/uL   Hemoglobin 8.6 (L) 12.0 - 15.0 g/dL   HCT 27.5 (L) 36.0 - 46.0 %   MCV 87.9 80.0 - 100.0 fL   MCH 27.5 26.0 - 34.0 pg   MCHC 31.3 30.0 - 36.0 g/dL   RDW 14.9 11.5 - 15.5 %   Platelets 71 (L) 150 - 400 K/uL    Comment: REPEATED TO VERIFY PLATELET COUNT CONFIRMED BY SMEAR SPECIMEN CHECKED FOR CLOTS Immature Platelet Fraction may be clinically  indicated, consider ordering this additional test [id number]    nRBC 0.5 (H) 0.0 - 0.2 %    Comment: Performed at [facility], [place] [address], [place], [place] [address]  Comprehensive metabolic panel     Status: Abnormal   Collection Time: 05/30/18 10:10 AM  Result Value Ref Range   Sodium 138 135 - 145 mmol/L   Potassium 4.2 3.5 - 5.1 mmol/L   Chloride 106 98 - 111 mmol/L   CO2 25 22 - 32 mmol/L   Glucose, Bld 90 70 - 99 mg/dL   BUN 15 6 - 20 mg/dL   Creatinine, Ser 0.61 0.44 - 1.00 mg/dL   Calcium 7.7 (L) 8.9 - 10.3 mg/dL   Total Protein 7.1 6.5 - 8.1 g/dL   Albumin 2.3 (L) 3.5 - 5.0 g/dL   AST 393 (H) 15 - 41 U/L   ALT 62 (H) 0 - 44  U/L   Alkaline Phosphatase 294 (H) 38 - 126 U/L   Total Bilirubin 0.5 0.3 - 1.2 mg/dL   GFR calc non Af Amer >60 >60 mL/min   GFR calc Af Amer >60 >60 mL/min    Comment: (NOTE) The eGFR has been calculated using the CKD EPI equation. This calculation has not been validated in all clinical situations. eGFR's persistently <60 mL/min signify possible Chronic Kidney Disease.    Anion gap 7 5 - 15    Comment: Performed at [facility], [place] [address] [place], [place] [address]  Lipase, blood     Status: Abnormal   Collection Time: 05/30/18 10:10 AM  Result Value Ref Range   Lipase 61 (H) 11 - 51 U/L    Comment: Performed at [facility], [place] [address], [place], [place] [address]  I-Stat Beta hCG blood, ED ([facility], [facility], [facility] only)     Status: None   Collection Time: 05/30/18 10:16 AM  Result Value Ref Range   I-stat hCG, quantitative <5.0 <5 mIU/mL   Comment 3            Comment:   GEST. AGE      CONC.  (mIU/mL)   <=1 WEEK        5 - 50     2 WEEKS       50 - 500     3 WEEKS       100 - 10,000     4 WEEKS     1,000 - 30,000        FEMALE AND NON-PREGNANT FEMALE:     LESS THAN 5 mIU/mL    Dg Chest 2 View  Result Date: 05/30/2018 CLINICAL DATA:  Mental status change, possible fall, forehead hematoma, intermittent urinary incompetence. Generalized weakness. EXAM: CHEST - 2 VIEW COMPARISON:  PA and lateral chest x-ray of May 10, 2018 FINDINGS: The lungs are well-expanded. There is no focal infiltrate. There is no pleural effusion. The heart and pulmonary vascularity are normal. The mediastinum is normal in width. The bony thorax exhibits no acute abnormality. IMPRESSION: There is no active cardiopulmonary disease. Electronically Signed   By: David  Martinique M.D.   On: 05/30/2018 09:54   Ct Head Wo Contrast  Result Date: 05/30/2018 CLINICAL DATA:  Generalized weakness, recent fall EXAM: CT HEAD WITHOUT CONTRAST TECHNIQUE: Contiguous axial images were  obtained from the base of the skull through the vertex without intravenous contrast. COMPARISON:  None. FINDINGS: Brain: No evidence of acute infarction, hemorrhage, hydrocephalus, extra-axial collection or mass  lesion/mass effect. Vascular: No hyperdense vessel or unexpected calcification. Skull: Normal. Negative for fracture or focal lesion. Sinuses/Orbits: Mild partial opacification of the right frontal sinus and bilateral sphenoid sinuses. Mastoid air cells are clear. Other: Small extracranial hematoma overlying the left frontal bone (series 2/image 11). IMPRESSION: Small extracranial hematoma overlying the left frontal bone. No evidence of acute intracranial abnormality. Electronically Signed   By: Julian Hy M.D.   On: 05/30/2018 10:56   Ct Abdomen Pelvis W Contrast  Result Date: 05/30/2018 CLINICAL DATA:  Elevated liver function tests. Adult failure to thrive. EXAM: CT ABDOMEN AND PELVIS WITH CONTRAST TECHNIQUE: Multidetector CT imaging of the abdomen and pelvis was performed using the standard protocol following bolus administration of intravenous contrast. CONTRAST:  164m ISOVUE-300 IOPAMIDOL (ISOVUE-300) INJECTION 61% COMPARISON:  CT scan of April 04, 2010. FINDINGS: Lower chest: No acute abnormality. Hepatobiliary: No focal liver abnormality is seen. No gallstones, gallbladder wall thickening, or biliary dilatation. Pancreas: Unremarkable. No pancreatic ductal dilatation or surrounding inflammatory changes. Spleen: Normal in size without focal abnormality. Adrenals/Urinary Tract: Adrenal glands are unremarkable. Kidneys are normal, without renal calculi, focal lesion, or hydronephrosis. Bladder is unremarkable. Stomach/Bowel: The stomach appears normal. There is no evidence of bowel obstruction or inflammation. The appendix is not visualized. Vascular/Lymphatic: Mildly enlarged mesenteric and retroperitoneal adenopathy is noted, all of which are less than 1 cm in size, and most likely are  inflammatory in etiology. Reproductive: Multiple small uterine fibroids are noted. No adnexal abnormality is noted. Other: No abdominal wall hernia or abnormality. No abdominopelvic ascites. Musculoskeletal: No acute or significant osseous findings. IMPRESSION: Mildly enlarged mesenteric and retroperitoneal adenopathy is now noted, most likely inflammatory or reactive in etiology. Multiple small uterine fibroids are noted. Electronically Signed   By: JMarijo Conception M.D.   On: 05/30/2018 14:26   None  Pending Labs Unresulted Labs (From admission, onward)    Start     Ordered   05/30/18 0927  Ethanol  ONCE - STAT,   STAT     05/30/18 0926   05/30/18 0927  Rapid urine drug screen (hospital performed)  ONCE - STAT,   R     05/30/18 0926   05/30/18 0927  Urinalysis, Routine w reflex microscopic  Once,   R     05/30/18 0926          Vitals/Pain Today's Vitals   05/30/18 1059 05/30/18 1205 05/30/18 1409 05/30/18 1619  BP: 101/71 100/69 100/69 112/74  Pulse: (!) 118 (!) 108 (!) 108 (!) 120  Resp: '16 16 16 16  ' Temp:      TempSrc:      SpO2: 99% 98% 98% 100%  Weight:      Height:      PainSc:        Isolation Precautions No active isolations  Medications Medications  iopamidol (ISOVUE-300) 61 % injection (has no administration in time range)  sodium chloride (PF) 0.9 % injection (has no administration in time range)  sodium chloride 0.9 % bolus 2,000 mL (0 mLs Intravenous Stopped 05/30/18 1141)  iopamidol (ISOVUE-300) 61 % injection 100 mL (100 mLs Intravenous Contrast Given 05/30/18 1401)    Mobility walks

## 2018-05-30 NOTE — ED Triage Notes (Signed)
EMS reports from group home, [facility] admitted to [facility] for SI. Dc'd on Saturday, nurse tech at group home post Dc noted hematoma to left forehead on Sunday. Pt c/o lethargy since possible fall and noted hematoma. Pt c/o episodes of urinary incontinence.  BP 109/66 HR 118 RR 20 Sp02 100 RA CBG 106  20ga LAC  500ml NS

## 2018-05-30 NOTE — ED Notes (Signed)
Pt states that she was unable to sit-up with regards to orthostatic vitals

## 2018-05-31 ENCOUNTER — Inpatient Hospital Stay (HOSPITAL_COMMUNITY): Payer: Medicaid Other

## 2018-05-31 DIAGNOSIS — A419 Sepsis, unspecified organism: Secondary | ICD-10-CM

## 2018-05-31 DIAGNOSIS — F312 Bipolar disorder, current episode manic severe with psychotic features: Secondary | ICD-10-CM

## 2018-05-31 DIAGNOSIS — R636 Underweight: Secondary | ICD-10-CM

## 2018-05-31 DIAGNOSIS — D696 Thrombocytopenia, unspecified: Secondary | ICD-10-CM

## 2018-05-31 LAB — CBC
HEMATOCRIT: 24 % — AB (ref 36.0–46.0)
Hemoglobin: 7.2 g/dL — ABNORMAL LOW (ref 12.0–15.0)
MCH: 27 pg (ref 26.0–34.0)
MCHC: 30 g/dL (ref 30.0–36.0)
MCV: 89.9 fL (ref 80.0–100.0)
Platelets: 58 10*3/uL — ABNORMAL LOW (ref 150–400)
RBC: 2.67 MIL/uL — ABNORMAL LOW (ref 3.87–5.11)
RDW: 15.2 % (ref 11.5–15.5)
WBC: 2.5 10*3/uL — ABNORMAL LOW (ref 4.0–10.5)
nRBC: 1.2 % — ABNORMAL HIGH (ref 0.0–0.2)

## 2018-05-31 LAB — COMPREHENSIVE METABOLIC PANEL
ALBUMIN: 1.7 g/dL — AB (ref 3.5–5.0)
ALK PHOS: 238 U/L — AB (ref 38–126)
ALT: 49 U/L — AB (ref 0–44)
AST: 302 U/L — ABNORMAL HIGH (ref 15–41)
Anion gap: 4 — ABNORMAL LOW (ref 5–15)
BUN: 9 mg/dL (ref 6–20)
CHLORIDE: 113 mmol/L — AB (ref 98–111)
CO2: 22 mmol/L (ref 22–32)
CREATININE: 0.52 mg/dL (ref 0.44–1.00)
Calcium: 7.3 mg/dL — ABNORMAL LOW (ref 8.9–10.3)
GFR calc Af Amer: >60 mL/min (ref >60)
GFR calc non Af Amer: >60 mL/min (ref >60)
GLUCOSE: 105 mg/dL — AB (ref 70–99)
Potassium: 3.5 mmol/L (ref 3.5–5.1)
SODIUM: 139 mmol/L (ref 135–145)
Total Bilirubin: 0.4 mg/dL (ref 0.3–1.2)
Total Protein: 5.8 g/dL — ABNORMAL LOW (ref 6.5–8.1)

## 2018-05-31 LAB — SALICYLATE LEVEL

## 2018-05-31 LAB — RAPID URINE DRUG SCREEN, HOSP PERFORMED
Amphetamines: NOT DETECTED
BENZODIAZEPINES: NOT DETECTED
Barbiturates: NOT DETECTED
Cocaine: NOT DETECTED
OPIATES: NOT DETECTED
Tetrahydrocannabinol: NOT DETECTED

## 2018-05-31 LAB — LACTIC ACID, PLASMA
LACTIC ACID, VENOUS: 1.7 mmol/L (ref 0.5–1.9)
Lactic Acid, Venous: 2.1 mmol/L (ref 0.5–1.9)

## 2018-05-31 LAB — TSH: TSH: 0.659 u[IU]/mL (ref 0.350–4.500)

## 2018-05-31 LAB — INFLUENZA PANEL BY PCR (TYPE A & B)
Influenza A By PCR: NEGATIVE
Influenza B By PCR: NEGATIVE

## 2018-05-31 LAB — HIV ANTIBODY (ROUTINE TESTING W REFLEX): HIV Screen 4th Generation wRfx: NONREACTIVE

## 2018-05-31 LAB — PROCALCITONIN: Procalcitonin: 0.43 ng/mL

## 2018-05-31 LAB — ETHANOL: Alcohol, Ethyl (B): <10 mg/dL (ref <10)

## 2018-05-31 LAB — MAGNESIUM: Magnesium: 1.8 mg/dL (ref 1.7–2.4)

## 2018-05-31 LAB — PHOSPHORUS: Phosphorus: 2.1 mg/dL — ABNORMAL LOW (ref 2.5–4.6)

## 2018-05-31 LAB — LIPASE, BLOOD: Lipase: 55 U/L — ABNORMAL HIGH (ref 11–51)

## 2018-05-31 MED ORDER — IBUPROFEN 200 MG PO TABS
400.0000 mg | ORAL_TABLET | Freq: Four times a day (QID) | ORAL | Status: DC | PRN
  Administered 2018-05-31 – 2018-06-06 (×5): 400 mg via ORAL
  Filled 2018-05-31 (×5): qty 2

## 2018-05-31 MED ORDER — FOLIC ACID 1 MG PO TABS
1.0000 mg | ORAL_TABLET | Freq: Every day | ORAL | Status: DC
  Administered 2018-05-31 – 2018-06-22 (×22): 1 mg via ORAL
  Filled 2018-05-31 (×22): qty 1

## 2018-05-31 MED ORDER — VITAMIN B-1 100 MG PO TABS
100.0000 mg | ORAL_TABLET | Freq: Every day | ORAL | Status: DC
  Administered 2018-06-01 – 2018-06-22 (×21): 100 mg via ORAL
  Filled 2018-05-31 (×21): qty 1

## 2018-05-31 MED ORDER — K PHOS MONO-SOD PHOS DI & MONO 155-852-130 MG PO TABS
500.0000 mg | ORAL_TABLET | Freq: Two times a day (BID) | ORAL | Status: AC
  Administered 2018-05-31 (×2): 500 mg via ORAL
  Filled 2018-05-31 (×2): qty 2

## 2018-05-31 MED ORDER — SODIUM CHLORIDE 0.9 % IV SOLN
1.0000 g | Freq: Two times a day (BID) | INTRAVENOUS | Status: DC
  Administered 2018-05-31 – 2018-06-04 (×8): 1 g via INTRAVENOUS
  Filled 2018-05-31 (×8): qty 1

## 2018-05-31 MED ORDER — CALCIUM GLUCONATE-NACL 1-0.675 GM/50ML-% IV SOLN
1.0000 g | Freq: Once | INTRAVENOUS | Status: AC
  Administered 2018-05-31: 1000 mg via INTRAVENOUS
  Filled 2018-05-31: qty 50

## 2018-05-31 MED ORDER — LORAZEPAM 2 MG/ML IJ SOLN: 1.0000 mg | Freq: Four times a day (QID) | INTRAMUSCULAR | Status: AC | PRN

## 2018-05-31 MED ORDER — LORAZEPAM 1 MG PO TABS: 1.0000 mg | ORAL_TABLET | Freq: Four times a day (QID) | ORAL | Status: AC | PRN

## 2018-05-31 MED ORDER — THIAMINE HCL 100 MG/ML IJ SOLN
100.0000 mg | Freq: Every day | INTRAMUSCULAR | Status: DC
  Administered 2018-05-31: 100 mg via INTRAVENOUS
  Filled 2018-05-31 (×2): qty 2

## 2018-05-31 MED ORDER — SODIUM CHLORIDE 0.9 % IV BOLUS
1000.0000 mL | Freq: Once | INTRAVENOUS | Status: AC
  Administered 2018-05-31: 1000 mL via INTRAVENOUS

## 2018-05-31 MED ORDER — VANCOMYCIN HCL IN DEXTROSE 750-5 MG/150ML-% IV SOLN
750.0000 mg | INTRAVENOUS | Status: DC
  Administered 2018-06-01 – 2018-06-03 (×3): 750 mg via INTRAVENOUS
  Filled 2018-05-31 (×3): qty 150

## 2018-05-31 MED ORDER — VANCOMYCIN HCL IN DEXTROSE 1-5 GM/200ML-% IV SOLN
1000.0000 mg | Freq: Once | INTRAVENOUS | Status: AC
  Administered 2018-05-31: 1000 mg via INTRAVENOUS
  Filled 2018-05-31: qty 200

## 2018-05-31 MED ORDER — ADULT MULTIVITAMIN W/MINERALS CH
1.0000 | ORAL_TABLET | Freq: Every day | ORAL | Status: DC
  Administered 2018-05-31 – 2018-06-22 (×22): 1 via ORAL
  Filled 2018-05-31 (×22): qty 1

## 2018-05-31 NOTE — Progress Notes (Signed)
PROGRESS NOTE    Channie Bostick  WUJ:[id number] DOB: Jun 27, 1966 DOA: 05/30/2018 PCP: Kaleen Mask, MD   Brief Narrative:  HPI per Dr. Virginia Rochester on 05/30/18 Yvonne [name] is a 52 y.o. female with medical history significant for bipolar disorder who resides in a group home and presented to the emergency room after having a fall following lightheadedness.  In the emergency room, noted to have significant orthostatic hypotension.White count normal, although patient noted to have some mild transaminitis.  Mildly elevated lipase level although she does not report any abdominal pain.  Initially no evidence of fever.  Chest x-ray unremarkable.  Abdominal CT also unrevealing.  Hospitalist were called for further evaluation.  Following admission to the floor, patient spiked a fever of 101.4, remained tachycardic.  Lactic acid and procalcitonin level ordered.  **LFTs still elevated and patient still spiking temperature so will place on [employer] Abx and give 2 liter NS Boluses. U/S of the Abdomen with diffuse hepatocellular disease such as possible hepatitis with minimal ascites noted around the liver.  There is also 2.3 cm round hypoechoic solid abnormality in the right hepatic lobe and MRI is recommended so have ordered MRCP  Assessment & Plan:   Principal Problem:   Orthostatic hypotension Active Problems:   Bipolar I disorder, current or most recent episode manic, with psychotic features (HCC)   Severe protein-calorie malnutrition (HCC)   Hypocalcemia   Transaminitis   Fever   Thrombocytopenia (HCC)  Sepsis from Unclear Etiology -Patient spiked a temperature (101.8), was tachycardic, and tachypenic, and has a Leukopenia (2.5) -Will hold Benztropine given that she keeps spiking fevers -Check CXR, Urine Cx, Blood Cx x2 -Give Boluses 2 Liters and will continue IVF hydration with D5 saline at rate 125 mils per hour -Follow-up cultures -Empirically start antibiotics with IV  vancomycin and IV cefepime -Check lactic acid level and pro calcitonin level was elevated at slightly 0.59 is trending down to 0.43; LA was 2.1 -Continue to Monitor and Repeat CBC in AM   Hypophosphatemia -Patient's phosphorus level this morning was 2.1 -Replete with p.o. K-Phos Neutral 500 mg p.o. twice daily x2 doses -Continue monitor and replete as necessary -Repeat phosphorus level in a.m.  Abnormal LFTs/Transaminitis -Patient's AST on admission was 393 and then trended down to 302 -Patient ALT was 62 and then trended down to 49 -Patient resides in a group home so unclear if she had a alcohol use or not -Placed on CIWA protocol empirically -Obtained right upper quadrant ultrasound showed coarse echotexture of the hepatic parenchyma suggesting diffuse hepatocellular disease such as possible hepatitis.  There is also hypoechoic areas that were noted around the portal triads suggesting edema potentially related to hepatic information or lymphatic obstruction secondary adenopathy at the porta hepatis region.  There is minimal ascites noted around the liver and there is a 2.3 cm rounded hypoechoic solid abnormality that was seen in the right hepatic lobe which was not seen on prior CT scan of the abdomen and further evaluation the MRI is recommended to evaluate for possible neoplasm.  There is a mild amount of sludge seen within the gallbladder lumen -Obtain hepatitis panel along with ANA reflex; HIV Was negative -EtOH Level was Negative and Salicylate Level was Negative -UDS Negative  -We will obtain a MRI of the abdomen with and without contrast -Discussed the case with Dr. Claudette Head of gastroenterology informally he recommends pursuing these and if patient does not improve consulting GI for formal evaluation  Orthostatic Hypotension -In  the setting of dehydration from poor p.o. intake and likely sepsis -Given 2 L of normal saline boluses and will continue with maintenance IV fluid  hydration with D5 normal saline rate of 125 mL's per hour -Repeat orthostatic vital signs in a.m. -PT OT evaluation recommending skilled nursing facility  Bipolar 1 disorder -Currently not manic but does have a history of psychotic features -Continue with home medications including aripiprazole 50 mg p.o. daily and escitalopram 5 mg p.o. daily -Hold benztropine as it can cause fever -Continue monitor very carefully  Underweight -Continue with Ensure Enlive p.o. twice daily supplements -Nutrition is consulted for further evaluation recommendations  Hypocalcemia -Was given 1 amp of calcium gluconate yesterday -Likely in the setting of poor p.o. intake -We will repeat 1 amp of calcium gluconate  Thrombocytopenia -Unclear etiology but likely in the setting of sepsis -Lab work from a week ago was noted to have platelet counts of 141 and then trended down to 71 is now 58 this morning -Continue SCDs and hold anticoagulation -Continue to monitor for signs and symptoms of bleeding -We will repeat CBC in the a.m.  Normocytic Anemia -Patient's hemoglobin/hematocrit went from 8.6/27.5 now 7.2/24.0 -Check anemia panel in a.m. -Continue to monitor for signs and symptoms of bleeding -Repeat CBC in the a.m.  Pancytopenia -As above patient's WBC is 2.5, hemoglobin/hematocrit 7.2/24.0, and the count was 58 -If continues to drop may obtain a hematology consult -Repeat CBC in a.m.  DVT prophylaxis: SCDs given thrombocytopenia Code Status: FULL CODE Family Communication: No family present at bedside Disposition Plan: Remain inpatient for further work-up and evaluation  Consultants:   None   Procedures:  RUQ U/S MRCP  Antimicrobials: Anti-infectives (From admission, onward)   Start     Dose/Rate Route Frequency Ordered Stop   05/31/18 1700  ceFEPIme (MAXIPIME) 1 g in sodium chloride 0.9 % 100 mL IVPB     1 g 200 mL/hr over 30 Minutes Intravenous Every 12 hours 05/31/18 1616      05/31/18 1630  vancomycin (VANCOCIN) IVPB 1000 mg/200 mL premix     1,000 mg 200 mL/hr over 60 Minutes Intravenous  Once 05/31/18 1616       Subjective: Seen and examined at bedside and was not feeling well.  Denied any chest pain, lightheadedness or dizziness and denies any burning discomfort but just felt fatigued and tired.  Slightly temperature this afternoon became tachypneic and tachycardic.  Rapid response was called by nurse patient given 2 L normal saline boluses any fever improved with ibuprofen.  Patient was hemodynamically stable entire time and has no other complaints or concerns at this time  Objective: Vitals:   05/31/18 1359 05/31/18 1439 05/31/18 1517 05/31/18 1537  BP: 112/72 108/70 110/74 108/73  Pulse: (!) 128 (!) 116 (!) 116 (!) 115  Resp: (!) 24 (!) 22 20 20   Temp: (!) 100.4 F (38 C) (!) 101.8 F (38.8 C) (!) 100.8 F (38.2 C) (!) 101.1 F (38.4 C)  TempSrc: Oral Oral Oral Oral  SpO2: 100% 99% 100% 100%  Weight:      Height:        Intake/Output Summary (Last 24 hours) at 05/31/2018 1611 Last data filed at 05/31/2018 0800 Gross per 24 hour  Intake 1448.81 ml  Output -  Net 1448.81 ml   Filed Weights   05/30/18 0854  Weight: 46.3 kg   Examination: Physical Exam:  Constitutional: Thin AAF in NAD and appears calm but uncomfortable Eyes: Lids and conjunctivae normal, sclerae  anicteric  ENMT: External Ears, Nose appear normal. Grossly normal hearing.  Neck: Appears normal, supple, no cervical masses, normal ROM, no appreciable thyromegaly; no JVD Respiratory: Diminished to auscultation bilaterally, no wheezing, rales, rhonchi or crackles. Normal respiratory effort and patient is not tachypenic. No accessory muscle use.  Cardiovascular: Tachycardic Rate, no murmurs / rubs / gallops. S1 and S2 auscultated. No extremity edema. Abdomen: Soft, mildly tender, non-distended. No masses palpated. No appreciable hepatosplenomegaly. Bowel sounds positive x4.  GU:  Deferred. Musculoskeletal: No clubbing / cyanosis of digits/nails. Normal strength and muscle tone.  Skin: No rashes, lesions, ulcers on a limited skin evaluation. No induration; Warm and dry.  Neurologic: CN 2-12 grossly intact with no focal deficits. Strength 5/5 in all 4. Romberg sign cerebellar reflexes not assessed.  Psychiatric: Normal judgment and insight. Alert and oriented x 3. Normal mood and appropriate affect.   Data Reviewed: I have personally reviewed following labs and imaging studies  CBC: Recent Labs  Lab 05/30/18 1010 05/31/18 0614  WBC 3.7* 2.5*  HGB 8.6* 7.2*  HCT 27.5* 24.0*  MCV 87.9 89.9  PLT 71* 58*   Basic Metabolic Panel: Recent Labs  Lab 05/30/18 1010 05/31/18 0614 05/31/18 0745 05/31/18 0749  NA 138 139  --   --   K 4.2 3.5  --   --   CL 106 113*  --   --   CO2 25 22  --   --   GLUCOSE 90 105*  --   --   BUN 15 9  --   --   CREATININE 0.61 0.52  --   --   CALCIUM 7.7* 7.3*  --   --   MG  --   --  1.8  --   PHOS  --   --   --  2.1*   GFR: Estimated Creatinine Clearance: 59.4 mL/min (by C-G formula based on SCr of 0.52 mg/dL). Liver Function Tests: Recent Labs  Lab 05/30/18 1010 05/31/18 0614  AST 393* 302*  ALT 62* 49*  ALKPHOS 294* 238*  BILITOT 0.5 0.4  PROT 7.1 5.8*  ALBUMIN 2.3* 1.7*   Recent Labs  Lab 05/30/18 1010 05/31/18 0614  LIPASE 61* 55*   No results for input(s): AMMONIA in the last 168 hours. Coagulation Profile: No results for input(s): INR, PROTIME in the last 168 hours. Cardiac Enzymes: No results for input(s): CKTOTAL, CKMB, CKMBINDEX, TROPONINI in the last 168 hours. BNP (last 3 results) No results for input(s): PROBNP in the last 8760 hours. HbA1C: No results for input(s): HGBA1C in the last 72 hours. CBG: No results for input(s): GLUCAP in the last 168 hours. Lipid Profile: No results for input(s): CHOL, HDL, LDLCALC, TRIG, CHOLHDL, LDLDIRECT in the last 72 hours. Thyroid Function Tests: Recent Labs     05/31/18 0614  TSH 0.659   Anemia Panel: No results for input(s): VITAMINB12, FOLATE, FERRITIN, TIBC, IRON, RETICCTPCT in the last 72 hours. Sepsis Labs: Recent Labs  Lab 05/30/18 1835  PROCALCITON 0.59  LATICACIDVEN 1.0    No results found for this or any previous visit (from the past 240 hour(s)).   Radiology Studies: Dg Chest 2 View  Result Date: 05/30/2018 CLINICAL DATA:  Mental status change, possible fall, forehead hematoma, intermittent urinary incompetence. Generalized weakness. EXAM: CHEST - 2 VIEW COMPARISON:  PA and lateral chest x-ray of May 10, 2018 FINDINGS: The lungs are well-expanded. There is no focal infiltrate. There is no pleural effusion. The heart and pulmonary vascularity  are normal. The mediastinum is normal in width. The bony thorax exhibits no acute abnormality. IMPRESSION: There is no active cardiopulmonary disease. Electronically Signed   By: David  SwazilandJordan M.D.   On: 05/30/2018 09:54   Ct Head Wo Contrast  Result Date: 05/30/2018 CLINICAL DATA:  Generalized weakness, recent fall EXAM: CT HEAD WITHOUT CONTRAST TECHNIQUE: Contiguous axial images were obtained from the base of the skull through the vertex without intravenous contrast. COMPARISON:  None. FINDINGS: Brain: No evidence of acute infarction, hemorrhage, hydrocephalus, extra-axial collection or mass lesion/mass effect. Vascular: No hyperdense vessel or unexpected calcification. Skull: Normal. Negative for fracture or focal lesion. Sinuses/Orbits: Mild partial opacification of the right frontal sinus and bilateral sphenoid sinuses. Mastoid air cells are clear. Other: Small extracranial hematoma overlying the left frontal bone (series 2/image 11). IMPRESSION: Small extracranial hematoma overlying the left frontal bone. No evidence of acute intracranial abnormality. Electronically Signed   By: Charline BillsSriyesh  Krishnan M.D.   On: 05/30/2018 10:56   Ct Abdomen Pelvis W Contrast  Result Date:  05/30/2018 CLINICAL DATA:  Elevated liver function tests. Adult failure to thrive. EXAM: CT ABDOMEN AND PELVIS WITH CONTRAST TECHNIQUE: Multidetector CT imaging of the abdomen and pelvis was performed using the standard protocol following bolus administration of intravenous contrast. CONTRAST:  100mL ISOVUE-300 IOPAMIDOL (ISOVUE-300) INJECTION 61% COMPARISON:  CT scan of April 04, 2010. FINDINGS: Lower chest: No acute abnormality. Hepatobiliary: No focal liver abnormality is seen. No gallstones, gallbladder wall thickening, or biliary dilatation. Pancreas: Unremarkable. No pancreatic ductal dilatation or surrounding inflammatory changes. Spleen: Normal in size without focal abnormality. Adrenals/Urinary Tract: Adrenal glands are unremarkable. Kidneys are normal, without renal calculi, focal lesion, or hydronephrosis. Bladder is unremarkable. Stomach/Bowel: The stomach appears normal. There is no evidence of bowel obstruction or inflammation. The appendix is not visualized. Vascular/Lymphatic: Mildly enlarged mesenteric and retroperitoneal adenopathy is noted, all of which are less than 1 cm in size, and most likely are inflammatory in etiology. Reproductive: Multiple small uterine fibroids are noted. No adnexal abnormality is noted. Other: No abdominal wall hernia or abnormality. No abdominopelvic ascites. Musculoskeletal: No acute or significant osseous findings. IMPRESSION: Mildly enlarged mesenteric and retroperitoneal adenopathy is now noted, most likely inflammatory or reactive in etiology. Multiple small uterine fibroids are noted. Electronically Signed   By: Lupita RaiderJames  Green Jr, M.D.   On: 05/30/2018 14:26   Dg Chest Port 1 View  Result Date: 05/31/2018 CLINICAL DATA:  Fever EXAM: PORTABLE CHEST 1 VIEW COMPARISON:  05/30/2018 chest radiograph FINDINGS: This is a mildly low volume film. The cardiomediastinal silhouette is unremarkable. There is no evidence of focal airspace disease, pulmonary edema,  suspicious pulmonary nodule/mass, pleural effusion, or pneumothorax. No acute bony abnormalities are identified. IMPRESSION: Low volume film without acute abnormality. Electronically Signed   By: Harmon PierJeffrey  Hu M.D.   On: 05/31/2018 15:16   [place] Abdomen Limited Ruq  Result Date: 05/31/2018 CLINICAL DATA:  Abnormal liver function tests. EXAM: ULTRASOUND ABDOMEN LIMITED RIGHT UPPER QUADRANT COMPARISON:  CT scan of May 30, 2018. FINDINGS: Gallbladder: No gallstones or wall thickening visualized. No sonographic Murphy sign noted by sonographer. Mild amount of sludge is seen within gallbladder lumen. Common bile duct: Diameter: 3 mm which is within normal limits. Liver: 2.3 cm hypoechoic solid abnormality is seen in the right hepatic lobe which is not visualized on prior CT scan. Coarse echotexture of hepatic parenchyma is noted suggesting diffuse hepatocellular disease. Hypoechoic areas are noted around the portal triads suggesting edema potentially related to hepatic  inflammation or adenopathy at porta hepatis region. Minimal ascites is noted around the liver. Portal vein is patent on color Doppler imaging with normal direction of blood flow towards the liver. IMPRESSION: Coarse echotexture of hepatic parenchyma is noted suggesting diffuse hepatocellular disease such as possible hepatitis. Hypoechoic areas are noted around the portal triads suggesting edema potentially related to hepatic inflammation, or lymphatic obstruction secondary to adenopathy at the porta hepatis region. Minimal ascites is noted around the liver. 2.3 cm rounded hypoechoic solid abnormality is seen in the right hepatic lobe which is not seen on prior CT scan. Further evaluation with MRI is recommended when patient can hold still to evaluate for possible neoplasm. Mild amount of sludge seen within gallbladder lumen. Electronically Signed   By: Lupita Raider, M.D.   On: 05/31/2018 12:42   Scheduled Meds: . ARIPiprazole  15 mg Oral Daily   . benztropine  0.5 mg Oral Daily  . escitalopram  5 mg Oral Daily  . feeding supplement (ENSURE ENLIVE)  237 mL Oral BID BM  . folic acid  1 mg Oral Daily  . multivitamin with minerals  1 tablet Oral Daily  . phosphorus  500 mg Oral BID  . thiamine  100 mg Oral Daily   Or  . thiamine  100 mg Intravenous Daily   Continuous Infusions: . dextrose 5 % and 0.9% NaCl 125 mL/hr at 05/31/18 1137  . sodium chloride      LOS: 1 day   Merlene Laughter, DO [facility] PAGER is on AMION  If 7PM-7AM, please contact night-coverage [email or web address] Password [email or web address] 05/31/2018, 4:11 PM

## 2018-05-31 NOTE — Progress Notes (Signed)
Pharmacy Antibiotic Note  Yvonne BalsamMelvine [name] is a 52 y.o. female resident of [facility] with a h/o bipolar disorder admitted on 05/30/2018 with generalized weakness, decreased oral intake, and possible sepsis of unknown source.  Pharmacy has been consulted for vancomycin and cefepime dosing.  Plan: Cefepime 1 g iv q 12h.   Vancomycin 1000 mg iv loading dose followed by 750 mg iv q 24 h. Predicted AUC 431 using IBW/ABW.   - F/U culture results and renal function   Height: 5\' 2"  (157.5 cm) Weight: 102 lb (46.3 kg) IBW/kg (Calculated) : 50.1  Temp (24hrs), Avg:100.6 F (38.1 C), Min:99.1 F (37.3 C), Max:101.8 F (38.8 C)  Recent Labs  Lab 05/30/18 1010 05/30/18 1835 05/31/18 0614  WBC 3.7*  --  2.5*  CREATININE 0.61  --  0.52  LATICACIDVEN  --  1.0  --     Estimated Creatinine Clearance: 59.4 mL/min (by C-G formula based on SCr of 0.52 mg/dL).    No Known Allergies  Antimicrobials this admission: 11/20 vancomycin >>  11/20 cefepime >>   Dose adjustments this admission:  Microbiology results: 11/20 BCx:  11/20 UCx:    Thank you for allowing pharmacy to be a part of this patient's care.  Luisa HartChristy, Abbegayle Denault D 05/31/2018 4:21 PM

## 2018-05-31 NOTE — Care Management Note (Signed)
Case Management Note  Patient Details  Name: Yvonne BalsamMelvine [name] MRN: [id number] Date of Birth: [birth year]  Subjective/Objective:                  52 y.o. female with medical history significant for bipolar disorder who resides in a group home and presented to the emergency room after having a fall following lightheadedness.  In the emergency room, noted to have significant orthostatic hypotension.White count normal, although patient noted to have some mild transaminitis.  Mildly elevated lipase level although she does not report any abdominal pain.  Initially no evidence of fever.  Chest x-ray unremarkable.  Abdominal CT also unrevealing. Following admission to the floor, patient spiked a fever of 101.4, remained tachycardic  Action/Plan: Will follow for progression of care and clinical status. Will follow for case management needs none present at this time.  Expected Discharge Date:  (unknown)               Expected Discharge Plan:  Group Home  In-House Referral:  Clinical Social Work  Discharge planning Services  CM Consult  Post Acute Care Choice:    Choice offered to:     DME Arranged:    DME Agency:     HH Arranged:    HH Agency:     Status of Service:  In process, will continue to follow  If discussed at Long Length of Stay Meetings, dates discussed:    Additional Comments:  Golda AcreDavis,  Lynn, RN 05/31/2018, 8:42 AM

## 2018-05-31 NOTE — Progress Notes (Signed)
CRITICAL VALUE ALERT  Critical Value:  Lactic Acid 2.1  Date & Time Notied:  05/31/18 @ 1651  Provider Notified: Dr. Marland McalpineSheikh @ 1652  Orders Received/Actions taken:   Two boluses and antibiotics already ordered, see new orders.

## 2018-05-31 NOTE — Progress Notes (Signed)
PT Cancellation Note  Patient Details Name: Gypsy BalsamMelvine Bohlin MRN: [id number] DOB: 1966-06-17   Cancelled Treatment:    Reason Eval/Treat Not Completed: Patient at procedure or test/unavailable, patient having US when checked on. Will check back later.RN aware.   Rada HayHill, Jakiera Ehler Elizabeth 05/31/2018, 1:07 PM Blanchard KelchKaren Anabela Crayton PT Acute Rehabilitation Services Pager [phone] Office [phone]

## 2018-05-31 NOTE — Progress Notes (Signed)
   05/31/18 1439  MEWS Score  Resp (!) 22  Pulse Rate (!) 116  BP 108/70  Temp (!) 101.8 F (38.8 C)  SpO2 99 %  MEWS Score  MEWS RR 1  MEWS Pulse 2  MEWS Systolic 0  MEWS LOC 0  MEWS Temp 2  MEWS Score 5  MEWS Score Color Red  MEWS Assessment  Is this an acute change? Yes  MEWS guidelines implemented *See Row Information* Red  Rapid Response Notification  Name of Rapid Response RN Notified Zoey RN  Date Rapid Response Notified 05/31/18  Time Rapid Response Notified 1442  Provider Notification  Provider Name/Title Dr. Marland McalpineSheikh  Date Provider Notified 05/31/18  Time Provider Notified 1440  Notification Type Page  Notification Reason Change in status  Response See new orders  Date of Provider Response 05/31/18  Time of Provider Response 1441    MD placed new orders, see new orders.  Will continue to assess for further needed interventions. Levora AngelHannah V Tracyann Duffell, RN

## 2018-05-31 NOTE — Evaluation (Signed)
Physical Therapy Evaluation Patient Details Name: Yvonne [name] MRN: [id number] DOB: 1965-09-25 Today's Date: 05/31/2018   History of Present Illness  52 yo female with H/O bipolar DO, presented to ED after a fall, orthostatic hypotension, fever. patient resides in a group home.  Clinical Impression  The patient is very difficult to understand the speech. The patient did not assist  With mobility and required 2 total assist. Patient did  State " I'm dizzy" multiple times. Sat with patient x 5 minutes. BP 121/77. The patient's sister present and reports that patient was independent until recently. Pt admitted with above diagnosis. Pt currently with functional limitations due to the deficits listed below (see PT Problem List).  Pt will benefit from skilled PT to increase their independence and safety with mobility to allow discharge to the venue listed below.       Follow Up Recommendations SNF    Equipment Recommendations  None recommended by PT    Recommendations for Other Services   OT    Precautions / Restrictions Precautions Precautions: Fall      Mobility  Bed Mobility Overal bed mobility: Needs Assistance Bed Mobility: Supine to Sit;Sit to Supine     Supine to sit: Total assist;+2 for physical assistance;+2 for safety/equipment Sit to supine: Total assist;+2 for physical assistance;+2 for safety/equipment   General bed mobility comments: patient offers no assistance for mobility. entire body is tending to be rigid/catatonic-like  Transfers                 General transfer comment: unable, constantly repeated, I'm dizzy"  Ambulation/Gait                Stairs            Wheelchair Mobility    Modified Rankin (Stroke Patients Only)       Balance Overall balance assessment: Needs assistance;History of Falls Sitting-balance support: Feet supported;Bilateral upper extremity supported Sitting balance-Leahy Scale: Zero   Postural control:  Right lateral lean                                   Pertinent Vitals/Pain Pain Assessment: Faces Faces Pain Scale: Hurts even more Pain Location: upper body Pain Descriptors / Indicators: Discomfort Pain Intervention(s): Limited activity within patient's tolerance;Monitored during session    Home Living Family/patient expects to be discharged to:: Group home Living Arrangements: Group Home               Additional Comments: per sister, patient independent in ADL's    Prior Function Level of Independence: Independent               Hand Dominance        Extremity/Trunk Assessment   Upper Extremity Assessment Upper Extremity Assessment: RUE deficits/detail;LUE deficits/detail RUE Deficits / Details: keeps arms flexed and rigid, noted shaking of the arms LUE Deficits / Details: similar    Lower Extremity Assessment Lower Extremity Assessment: RLE deficits/detail;LLE deficits/detail RLE Deficits / Details: legs are stiff and tending to be extension tone.  RLE: Unable to fully assess due to pain LLE Deficits / Details: similar LLE: Unable to fully assess due to pain    Cervical / Trunk Assessment Cervical / Trunk Assessment: Other exceptions Cervical / Trunk Exceptions: listing to right  Communication   Communication: (speech is limited and very quiet.)  Cognition Arousal/Alertness: Awake/alert   Overall Cognitive Status: Difficult to assess  General Comments[facility], patient did not provide information so asked siter to provide.       General Comments      Exercises     Assessment/Plan    PT Assessment Patient needs continued PT services  PT Problem List Decreased range of motion;Decreased activity tolerance;Decreased balance;Decreased mobility;Decreased coordination;Cardiopulmonary status limiting activity;Decreased knowledge of precautions;Decreased safety  awareness;Decreased knowledge of use of DME;Decreased cognition;Impaired tone       PT Treatment Interventions DME instruction;Gait training;Functional mobility training;Therapeutic activities;Therapeutic exercise;Patient/family education    PT Goals (Current goals can be found in the Care Plan section)  Acute Rehab PT Goals Patient Stated Goal: agreed with mobility,  PT Goal Formulation: With patient/family Time For Goal Achievement: 06/14/18 Potential to Achieve Goals: Fair    Frequency Min 2X/week   Barriers to discharge Decreased caregiver support      Co-evaluation               AM-PAC PT "6 Clicks" Daily Activity  Outcome Measure Difficulty turning over in bed (including adjusting bedclothes, sheets and blankets)?: Unable Difficulty moving from lying on back to sitting on the side of the bed? : Unable Difficulty sitting down on and standing up from a chair with arms (e.g., wheelchair, bedside commode, etc,.)?: Unable Help needed moving to and from a bed to chair (including a wheelchair)?: Total Help needed walking in hospital room?: Total Help needed climbing 3-5 steps with a railing? : Total 6 Click Score: 6    End of Session   Activity Tolerance: Patient limited by fatigue;Treatment limited secondary to medical complications (Comment) Patient left: in bed;with call bell/phone within reach;with bed alarm set;with family/visitor present Nurse Communication: Mobility status PT Visit Diagnosis: Unsteadiness on feet (R26.81)    Time: [id number] PT Time Calculation (min) (ACUTE ONLY): 20 min   Charges:   PT Evaluation $PT Eval Low Complexity: 1 Low          Blanchard KelchKaren Saidee Geremia PT Acute Rehabilitation Services Pager [phone] Office [phone]   Rada HayHill, Jerman Tinnon Elizabeth 05/31/2018, 3:14 PM

## 2018-05-31 NOTE — Progress Notes (Signed)
Initial Nutrition Assessment  DOCUMENTATION CODES:   Underweight  INTERVENTION:   Continue Ensure Enlive po BID, each supplement provides 350 kcal and 20 grams of protein Encourage PO intake  NUTRITION DIAGNOSIS:   Inadequate oral intake related to (mental status) as evidenced by per patient/family report, meal completion < 25%.  GOAL:   Patient will meet greater than or equal to 90% of their needs   MONITOR:   PO intake, Supplement acceptance, Labs, Weight trends, I & O's  REASON FOR ASSESSMENT:   Consult Assessment of nutrition requirement/status  ASSESSMENT:   52 y.o. female with medical history significant for bipolar disorder who resides in a group home and presented to the emergency room after having a fall following lightheadedness.  Patient in room with niece and nurse tech at bedside. Tech currently feeding patient some of her lunch. Pt consumed most of her chocolate pudding and some broth. This is her typical intake per patient. Pt not a good historian. Pt refused breakfast this morning. Pt agreed to drink some Ensure supplements later today. States she will as long as it is chocolate flavor.  Per weight records, pt's weight is up since 2018.  Suspect some degree of malnutrition given poor PO intake and underweight status.  Medications: Folic acid tablet daily, Multivitamin with minerals daily, K-Phos tablet BID, IV Thiamine daily, D5-.9% NaCl infusion at 125 ml/hr Labs reviewed: Low Phos   NUTRITION - FOCUSED PHYSICAL EXAM:  Unable to perform as patient being fed lunch meal.  Diet Order:   Diet Order            Diet regular Room service appropriate? Yes; Fluid consistency: Thin  Diet effective now              EDUCATION NEEDS:   Not appropriate for education at this time  Skin:  Skin Assessment: Reviewed RN Assessment  Last BM:  PTA  Height:   Ht Readings from Last 1 Encounters:  05/30/18 5\' 2"  (1.575 m)    Weight:   Wt Readings from  Last 1 Encounters:  05/30/18 46.3 kg    Ideal Body Weight:  50 kg  BMI:  Body mass index is 18.66 kg/m.  Estimated Nutritional Needs:   Kcal:  1400-1600  Protein:  65-75g  Fluid:  1.6L/day  Tilda FrancoLindsey Kynesha Guerin, MS, RD, LDN [facility] Clinical Dietitian Pager: [phone] After Hours Pager: [phone]

## 2018-06-01 ENCOUNTER — Inpatient Hospital Stay (HOSPITAL_COMMUNITY): Payer: Medicaid Other

## 2018-06-01 DIAGNOSIS — R935 Abnormal findings on diagnostic imaging of other abdominal regions, including retroperitoneum: Secondary | ICD-10-CM

## 2018-06-01 DIAGNOSIS — R945 Abnormal results of liver function studies: Secondary | ICD-10-CM

## 2018-06-01 LAB — COMPREHENSIVE METABOLIC PANEL
ALBUMIN: 1.6 g/dL — AB (ref 3.5–5.0)
ALT: 49 U/L — ABNORMAL HIGH (ref 0–44)
ANION GAP: 5 (ref 5–15)
AST: 327 U/L — AB (ref 15–41)
Alkaline Phosphatase: 285 U/L — ABNORMAL HIGH (ref 38–126)
BUN: 7 mg/dL (ref 6–20)
CHLORIDE: 113 mmol/L — AB (ref 98–111)
CO2: 21 mmol/L — AB (ref 22–32)
Calcium: 7 mg/dL — ABNORMAL LOW (ref 8.9–10.3)
Creatinine, Ser: 0.43 mg/dL — ABNORMAL LOW (ref 0.44–1.00)
GFR calc Af Amer: >60 mL/min (ref >60)
GFR calc non Af Amer: >60 mL/min (ref >60)
GLUCOSE: 93 mg/dL (ref 70–99)
Potassium: 3.6 mmol/L (ref 3.5–5.1)
SODIUM: 139 mmol/L (ref 135–145)
Total Bilirubin: 0.4 mg/dL (ref 0.3–1.2)
Total Protein: 5.6 g/dL — ABNORMAL LOW (ref 6.5–8.1)

## 2018-06-01 LAB — CBC WITH DIFFERENTIAL/PLATELET
Abs Immature Granulocytes: 0.13 10*3/uL — ABNORMAL HIGH (ref 0.00–0.07)
BASOS ABS: 0 10*3/uL (ref 0.0–0.1)
BASOS PCT: 0 %
EOS PCT: 0 %
Eosinophils Absolute: 0 10*3/uL (ref 0.0–0.5)
HEMATOCRIT: 23.4 % — AB (ref 36.0–46.0)
Hemoglobin: 7.1 g/dL — ABNORMAL LOW (ref 12.0–15.0)
IMMATURE GRANULOCYTES: 6 %
Lymphocytes Relative: 29 %
Lymphs Abs: 0.6 10*3/uL — ABNORMAL LOW (ref 0.7–4.0)
MCH: 27.2 pg (ref 26.0–34.0)
MCHC: 30.3 g/dL (ref 30.0–36.0)
MCV: 89.7 fL (ref 80.0–100.0)
Monocytes Absolute: 0.2 10*3/uL (ref 0.1–1.0)
Monocytes Relative: 8 %
NEUTROS PCT: 57 %
NRBC: 1.4 % — AB (ref 0.0–0.2)
Neutro Abs: 1.3 10*3/uL — ABNORMAL LOW (ref 1.7–7.7)
PLATELETS: 52 10*3/uL — AB (ref 150–400)
RBC: 2.61 MIL/uL — AB (ref 3.87–5.11)
RDW: 15.2 % (ref 11.5–15.5)
WBC: 2.2 10*3/uL — AB (ref 4.0–10.5)

## 2018-06-01 LAB — HEPATITIS PANEL, ACUTE
HCV AB: 0.4 {s_co_ratio} (ref 0.0–0.9)
HEP A IGM: NEGATIVE
HEP B S AG: NEGATIVE
Hep B C IgM: NEGATIVE

## 2018-06-01 LAB — PROCALCITONIN: PROCALCITONIN: 0.38 ng/mL

## 2018-06-01 LAB — RETICULOCYTES
Immature Retic Fract: 9.6 % (ref 2.3–15.9)
RBC.: 2.61 MIL/uL — ABNORMAL LOW (ref 3.87–5.11)
Retic Count, Absolute: 32.1 10*3/uL (ref 19.0–186.0)
Retic Ct Pct: 1.2 % (ref 0.4–3.1)

## 2018-06-01 LAB — PREPARE RBC (CROSSMATCH)

## 2018-06-01 LAB — FERRITIN: Ferritin: 5643 ng/mL — ABNORMAL HIGH (ref 11–307)

## 2018-06-01 LAB — VITAMIN B12: VITAMIN B 12: 270 pg/mL (ref 180–914)

## 2018-06-01 LAB — PROTIME-INR
INR: 0.9
PROTHROMBIN TIME: 12.1 s (ref 11.4–15.2)

## 2018-06-01 LAB — PHOSPHORUS: Phosphorus: 2.3 mg/dL — ABNORMAL LOW (ref 2.5–4.6)

## 2018-06-01 LAB — MAGNESIUM: Magnesium: 1.6 mg/dL — ABNORMAL LOW (ref 1.7–2.4)

## 2018-06-01 LAB — IRON AND TIBC
IRON: 33 ug/dL (ref 28–170)
SATURATION RATIOS: 29 % (ref 10.4–31.8)
TIBC: 115 ug/dL — AB (ref 250–450)
UIBC: 82 ug/dL

## 2018-06-01 LAB — ABO/RH: ABO/RH(D): O POS

## 2018-06-01 LAB — FOLATE: FOLATE: 9.3 ng/mL (ref >5.9)

## 2018-06-01 MED ORDER — MAGNESIUM SULFATE 2 GM/50ML IV SOLN
2.0000 g | Freq: Once | INTRAVENOUS | Status: AC
  Administered 2018-06-01: 2 g via INTRAVENOUS
  Filled 2018-06-01: qty 50

## 2018-06-01 MED ORDER — K PHOS MONO-SOD PHOS DI & MONO 155-852-130 MG PO TABS
500.0000 mg | ORAL_TABLET | Freq: Two times a day (BID) | ORAL | Status: AC
  Administered 2018-06-01 (×2): 500 mg via ORAL
  Filled 2018-06-01 (×2): qty 2

## 2018-06-01 MED ORDER — SODIUM CHLORIDE 0.9% IV SOLUTION
Freq: Once | INTRAVENOUS | Status: AC
  Administered 2018-06-01: 11:00:00 via INTRAVENOUS

## 2018-06-01 NOTE — Progress Notes (Signed)
Patient too tachycardic while standing to complete orthostatic vitals.

## 2018-06-01 NOTE — Consult Note (Addendum)
Referring Provider: [facility]  Primary Care Physician:  Yvonne Downing, MD Primary Gastroenterologist:   unassigned     Reason for Consultation: abnormal liver tests    ASSESSMENT & RECOMMENDATIONS    52 yo female with progressive weakness / fevers / worsening pancytopenia with left shift and now new elevation in liver tests (AST and Alk Phos). U/S suggests diffuse hepatocellular disease and a solid hypoechoic lesion in right lobe. Her physical weakness / deconditioning is most striking. She didn't have the strength to pull herself up in bed. Etiology of abnormal liver tests, U/S are not yet clear. Autoimmune disease, underlying neoplasm with mets?  Other?  -Viral hepatitis studies negative. await ANA. Adding AMA, ASMA, ceruloplasmin, EBV and CMV antibodies.  -obtaining INR, alk phos isoenzymes -await MRI results.  -continue empiric antibiotics. Awaiting blood cultures. .    Attending Physician Note   I have taken a history, examined the patient and reviewed the chart. I agree with the Advanced Practitioner's note, impression and recommendations. Severe weakness, pancytopenia, suspected sepsis with recently rising LFTs: mainly AST and alk phos. [place] with solid right hepatic lobe lesion and suspected diffuse hepatocellular disease. Given normal iron and normal iron saturation the ferritin elevation is an acute phase reactant - this is not hemochromatosis. Abd MRI/MRCP today. ANA, AMA, ASMA, ceruloplasmin, EBV IgM, CMV IgM. Continue antibiotics.   Yvonne Edward, MD [facility] [phone]       HPI: Yvonne [name] is a 52 y.o. female with bipolar disorder, resident of a group home who was seen in ED on 10/31 for fever. Labs were okay except for a drop in Hgb in absence of bleeding and heme negative stools. She was seen again in ED on 11/12 for generalized weakness and poor appetite after starting prozac. Labs reassuring. She did have orthostatic changes. Given IV fluids and  advised to follow up with Psychiatry to discuss medications.   Yvonne [name] came back to ED on 05/30/18 with weakness, falls and dysuria. This time she was found to have abnormal liver tests. She was tachycardic with orthostatic hypotension. CTAP with contrast remarkable for mesenteric and retroperitoneal adenopathy but otherwise unrevealing. Following admission she spike fever of 101.4.  No source of infection found, getting antibiotics empirically. U/S obtained with findings below.  RUQ U/S IMPRESSION: Coarse echotexture of hepatic parenchyma is noted suggesting diffuse hepatocellular disease such as possible hepatitis. Hypoechoic areas are noted around the portal triads suggesting edema potentially related to hepatic inflammation, or lymphatic obstruction secondary to adenopathy at the porta hepatis region. Minimal ascites is noted around the liver.  2.3 cm rounded hypoechoic solid abnormality is seen in the right hepatic lobe which is not seen on prior CT scan. Further evaluation with MRI is recommended when patient can hold still to evaluate for possible neoplasm.  Mild amount of sludge seen within gallbladder lumen.  Labs Viral Hep studies negative. . Influenza negative. HIV negative.  Ferritin is 5600 Alk phos 285 AST 327 ALT 49 Tbili 0.4   Patient hasn't started any new meds recently except for prozac which was stopped due to loss of appetite. UDS negative. No known history of liver disease. She has no abdominal pain, mainly complains of weakness.   Past Medical History:  Diagnosis Date  . Bipolar 1 disorder ([place])   . Medical history non-contributory   . Mental disorder   . No pertinent past medical history     Past Surgical History:  Procedure Laterality Date  . NO PAST SURGERIES  Prior to Admission medications   Medication Sig Start Date End Date Taking? Authorizing Provider  acetaminophen (TYLENOL) 325 MG tablet Take 650 mg by mouth 3 (three) times daily.   Yes  [provider]  ARIPiprazole (ABILIFY) 15 MG tablet Take 1 tablet (15 mg total) by mouth daily. For mood control 06/23/17  Yes Lindell Spar I, NP  benztropine (COGENTIN) 0.5 MG tablet Take 1 tablet (0.5 mg total) by mouth daily. For prevention of drug induced tremors 06/23/17  Yes Nwoko, Agnes I, NP  escitalopram (LEXAPRO) 5 MG tablet Take 5 mg by mouth daily.   Yes [provider]  hydrOXYzine (ATARAX/VISTARIL) 25 MG tablet Take 25 mg by mouth 3 (three) times daily as needed for anxiety.   Yes [provider]  sennosides-docusate sodium (SENOKOT-S) 8.6-50 MG tablet Take 2 tablets by mouth at bedtime as needed for constipation.   Yes [provider]  Vitamin D, Ergocalciferol, (DRISDOL) 1.25 MG (50000 UT) CAPS capsule Take 50,000 Units by mouth every 14 (fourteen) days.   Yes [provider]    Current Facility-Administered Medications  Medication Dose Route Frequency Provider Last Rate Last Dose  . 0.9 %  sodium chloride infusion (Manually program via Guardrails IV Fluids)   Intravenous Once [place], Omair Latif, DO      . ARIPiprazole (ABILIFY) tablet 15 mg  15 mg Oral Daily Annita Brod, MD   15 mg at 05/31/18 1239  . ceFEPIme (MAXIPIME) 1 g in sodium chloride 0.9 % 100 mL IVPB  1 g Intravenous Q12H Sheikh, Omair Latif, DO 200 mL/hr at 06/01/18 0530 1 g at 06/01/18 0530  . dextrose 5 %-0.9 % sodium chloride infusion   Intravenous Continuous Annita Brod, MD 125 mL/hr at 06/01/18 0529    . escitalopram (LEXAPRO) tablet 5 mg  5 mg Oral Daily Annita Brod, MD   5 mg at 05/31/18 1239  . feeding supplement (ENSURE ENLIVE) (ENSURE ENLIVE) liquid 237 mL  237 mL Oral BID BM Annita Brod, MD      . folic acid (FOLVITE) tablet 1 mg  1 mg Oral Daily Raiford Noble [place], DO   1 mg at 05/31/18 1238  . hydrOXYzine (ATARAX/VISTARIL) tablet 25 mg  25 mg Oral TID PRN Annita Brod, MD      . ibuprofen (ADVIL,MOTRIN) tablet 400 mg  400 mg  Oral Q6H PRN Raiford Noble [place], DO   400 mg at 06/01/18 0548  . LORazepam (ATIVAN) tablet 1 mg  1 mg Oral Q6H PRN Raiford Noble [place], DO       Or  . LORazepam (ATIVAN) injection 1 mg  1 mg Intravenous Q6H PRN Raiford Noble Latif, DO      . multivitamin with minerals tablet 1 tablet  1 tablet Oral Daily Raiford Noble [place], DO   1 tablet at 05/31/18 1238  . ondansetron (ZOFRAN) tablet 4 mg  4 mg Oral Q6H PRN Annita Brod, MD       Or  . ondansetron [facility]) injection 4 mg  4 mg Intravenous Q6H PRN Annita Brod, MD      . phosphorus (K PHOS NEUTRAL) tablet 500 mg  500 mg Oral BID Sheikh, Omair Latif, DO      . polyethylene glycol (MIRALAX / GLYCOLAX) packet 17 g  17 g Oral Daily PRN Annita Brod, MD      . thiamine (VITAMIN B-1) tablet 100 mg  100 mg Oral Daily [place], Georgina Quint [place], [place]  Or  . thiamine (B-1) injection 100 mg  100 mg Intravenous Daily Raiford Noble Latif, DO   100 mg at 05/31/18 1240  . vancomycin (VANCOCIN) IVPB 750 mg/150 ml premix  750 mg Intravenous Q24H Sheikh, Georgina Quint [place], DO        Allergies as of 05/30/2018  . (No Known Allergies)    Family History  Problem Relation Age of Onset  . Depression Sister   . Depression Brother   . Depression Sister   . CAD Mother   . Hypertension Father   . Diabetes Father   . Bipolar disorder Cousin     Social History   Socioeconomic History  . Marital status: Single    Spouse name: Not on file  . Number of children: Not on file  . Years of education: Not on file  . Highest education level: Not on file  Occupational History  . Not on file  Social Needs  . Financial resource strain: Not on file  . Food insecurity:    Worry: Not on file    Inability: Not on file  . Transportation needs:    Medical: Not on file    Non-medical: Not on file  Tobacco Use  . Smoking status: Never Smoker  . Smokeless tobacco: Never Used  Substance and Sexual Activity  . Alcohol use: No  . Drug use: No  . Sexual  activity: Never  Lifestyle  . Physical activity:    Days per week: Not on file    Minutes per session: Not on file  . Stress: Not on file  Relationships  . Social connections:    Talks on phone: Not on file    Gets together: Not on file    Attends religious service: Not on file    Active member of club or organization: Not on file    Attends meetings of clubs or organizations: Not on file    Relationship status: Not on file  . Intimate partner violence:    Fear of current or ex partner: Not on file    Emotionally abused: Not on file    Physically abused: Not on file    Forced sexual activity: Not on file  Other Topics Concern  . Not on file  Social History Narrative  . Not on file    Review of Systems: All systems reviewed and negative except where noted in HPI.  Physical Exam: Vital signs in last 24 hours: Temp:  [98.1 F (36.7 C)-101.8 F (38.8 C)] 98.1 F (36.7 C) (11/21 1107) Pulse Rate:  [99-128] 99 (11/21 1107) Resp:  [16-24] 18 (11/21 1107) BP: (93-115)/(59-78) 95/59 (11/21 1107) SpO2:  [96 %-100 %] 99 % (11/21 1107)   General:   Alert, thin female in NAD Psych:  cooperative. Flat affect. Eyes:  Pupils equal, sclera clear, no icterus.   Conjunctiva pink. Ears:  Normal auditory acuity. Nose:  No deformity, discharge,  or lesions. Neck:  Supple; no masses Lungs:  Clear throughout to auscultation.   No wheezes, crackles, or rhonchi.  Heart:  Regular rate and rhythm; no murmurs, no lower extremity edema Abdomen:  Soft, non-distended, nontender, BS active, no palp mass    Rectal:  Deferred  Msk:  Symmetrical without gross deformities. . Neurologic:  Alert and  oriented x4;  grossly normal neurologically. Skin:  Intact without significant lesions or rashes.   Intake/Output from previous day: 11/20 0701 - 11/21 0700 In: 3077.3 [P.O.:10; I.V.:978; IV Piggyback:2089.3] Out: -  Intake/Output this shift:  Total I/O In: -  Out: 700 [Urine:700]  Lab  Results: Recent Labs    05/30/18 1010 05/31/18 0614 06/01/18 0627  WBC 3.7* 2.5* 2.2*  HGB 8.6* 7.2* 7.1*  HCT 27.5* 24.0* 23.4*  PLT 71* 58* 52*   BMET Recent Labs    05/30/18 1010 05/31/18 0614 06/01/18 0627  NA 138 139 139  K 4.2 3.5 3.6  CL 106 113* 113*  CO2 25 22 21*  GLUCOSE 90 105* 93  BUN _0 CREATININE 0.61 0.52 0.43*  CALCIUM 7.7* 7.3* 7.0*   LFT Recent Labs    06/01/18 0627  PROT 5.6*  ALBUMIN 1.6*  AST 327*  ALT 49*  ALKPHOS 285*  BILITOT 0.4   PT/INR No results for input(s): LABPROT, INR in the last 72 hours. Hepatitis Panel Recent Labs    05/31/18 0749  HEPBSAG Negative  HCVAB 0.4  HEPAIGM Negative  HEPBIGM Negative      Studies/Results: Ct Abdomen Pelvis W Contrast  Result Date: 05/30/2018 CLINICAL DATA:  Elevated liver function tests. Adult failure to thrive. EXAM: CT ABDOMEN AND PELVIS WITH CONTRAST TECHNIQUE: Multidetector CT imaging of the abdomen and pelvis was performed using the standard protocol following bolus administration of intravenous contrast. CONTRAST:  148m ISOVUE-300 IOPAMIDOL (ISOVUE-300) INJECTION 61% COMPARISON:  CT scan of April 04, 2010. FINDINGS: Lower chest: No acute abnormality. Hepatobiliary: No focal liver abnormality is seen. No gallstones, gallbladder wall thickening, or biliary dilatation. Pancreas: Unremarkable. No pancreatic ductal dilatation or surrounding inflammatory changes. Spleen: Normal in size without focal abnormality. Adrenals/Urinary Tract: Adrenal glands are unremarkable. Kidneys are normal, without renal calculi, focal lesion, or hydronephrosis. Bladder is unremarkable. Stomach/Bowel: The stomach appears normal. There is no evidence of bowel obstruction or inflammation. The appendix is not visualized. Vascular/Lymphatic: Mildly enlarged mesenteric and retroperitoneal adenopathy is noted, all of which are less than 1 cm in size, and most likely are inflammatory in etiology. Reproductive:  Multiple small uterine fibroids are noted. No adnexal abnormality is noted. Other: No abdominal wall hernia or abnormality. No abdominopelvic ascites. Musculoskeletal: No acute or significant osseous findings. IMPRESSION: Mildly enlarged mesenteric and retroperitoneal adenopathy is now noted, most likely inflammatory or reactive in etiology. Multiple small uterine fibroids are noted. Electronically Signed   By: JMarijo Conception M.D.   On: 05/30/2018 14:26   Dg Chest Port 1 View  Result Date: 05/31/2018 CLINICAL DATA:  Fever EXAM: PORTABLE CHEST 1 VIEW COMPARISON:  05/30/2018 chest radiograph FINDINGS: This is a mildly low volume film. The cardiomediastinal silhouette is unremarkable. There is no evidence of focal airspace disease, pulmonary edema, suspicious pulmonary nodule/mass, pleural effusion, or pneumothorax. No acute bony abnormalities are identified. IMPRESSION: Low volume film without acute abnormality. Electronically Signed   By: JMargarette CanadaM.D.   On: 05/31/2018 15:16   [place]domen Limited Ruq  Result Date: 05/31/2018 CLINICAL DATA:  Abnormal liver function tests. EXAM: ULTRASOUND ABDOMEN LIMITED RIGHT UPPER QUADRANT COMPARISON:  CT scan of May 30, 2018. FINDINGS: Gallbladder: No gallstones or wall thickening visualized. No sonographic Murphy sign noted by sonographer. Mild amount of sludge is seen within gallbladder lumen. Common bile duct: Diameter: 3 mm which is within normal limits. Liver: 2.3 cm hypoechoic solid abnormality is seen in the right hepatic lobe which is not visualized on prior CT scan. Coarse echotexture of hepatic parenchyma is noted suggesting diffuse hepatocellular disease. Hypoechoic areas are noted around the portal triads suggesting edema potentially related to hepatic inflammation or adenopathy at porta  hepatis region. Minimal ascites is noted around the liver. Portal vein is patent on color Doppler imaging with normal direction of blood flow towards the liver.  IMPRESSION: Coarse echotexture of hepatic parenchyma is noted suggesting diffuse hepatocellular disease such as possible hepatitis. Hypoechoic areas are noted around the portal triads suggesting edema potentially related to hepatic inflammation, or lymphatic obstruction secondary to adenopathy at the porta hepatis region. Minimal ascites is noted around the liver. 2.3 cm rounded hypoechoic solid abnormality is seen in the right hepatic lobe which is not seen on prior CT scan. Further evaluation with MRI is recommended when patient can hold still to evaluate for possible neoplasm. Mild amount of sludge seen within gallbladder lumen. Electronically Signed   By: Marijo Conception, M.D.   On: 05/31/2018 12:42     Tye Savoy, NP-C @  06/01/2018, 12:10 PM

## 2018-06-01 NOTE — Progress Notes (Signed)
OT Cancellation Note  Patient Details Name: Gypsy BalsamMelvine Willets MRN: [id number] DOB: [birth year]   Cancelled Treatment:    Reason Eval/Treat Not Completed: Medical issues which prohibited therapy.  Noted blood planned for symptomatic anemia. Will either check back this pm or tomorrow  Geneva Barrero 06/01/2018, 8:14 AM  Marica OtterMaryellen Maguire [place], OTR/L Acute Rehabilitation Services [phone] [facility] pager [phone] office 06/01/2018

## 2018-06-01 NOTE — Progress Notes (Signed)
Pt alert but not oriented. Pt was able to complete about 3/4ths of her MRI/MRCP. Pt refused any further imaging due to pain. Pt did not want to continue. The remainder of the scan aborted and images sent across as a MRCP without,

## 2018-06-01 NOTE — Progress Notes (Signed)
PROGRESS NOTE    Yvonne [name]  ZOX:[id number] DOB: Jan 15, 1966 DOA: 05/30/2018 PCP: Leonard Downing, MD   Brief Narrative:  HPI per Dr. Gevena Barre on 05/30/18 Yvonne [name] is a 52 y.o. female with medical history significant for bipolar disorder who resides in a group home and presented to the emergency room after having a fall following lightheadedness.  In the emergency room, noted to have significant orthostatic hypotension.White count normal, although patient noted to have some mild transaminitis.  Mildly elevated lipase level although she does not report any abdominal pain.  Initially no evidence of fever.  Chest x-ray unremarkable.  Abdominal CT also unrevealing.  Hospitalist were called for further evaluation.  Following admission to the floor, patient spiked a fever of 101.4, remained tachycardic.  Lactic acid and procalcitonin level ordered.  **LFTs still elevated and patient still spiking temperature so will place on [employer] Abx and give 2 liter NS Boluses. U/S of the Abdomen with diffuse hepatocellular disease such as possible hepatitis with minimal ascites noted around the liver.  There is also 2.3 cm round hypoechoic solid abnormality in the right hepatic lobe and MRI is recommended so have ordered MRCP; patient was noted to be still pancytopenic so I discussed the case with Dr. Alen Blew of oncology who recommends continue to watch.  We will transfuse 1 unit of PRBCs for the patient and I have consulted gastroenterology for further evaluation recommendations given that her ferritin level extremely high and her LFTs are still elevated.  Currently awaiting MRI of the abdomen  Assessment & Plan:   Principal Problem:   Orthostatic hypotension Active Problems:   Bipolar I disorder, current or most recent episode manic, with psychotic features (HCC)   Severe protein-calorie malnutrition (HCC)   Hypocalcemia   Transaminitis   Fever   Thrombocytopenia (HCC)  Sepsis  from Unclear Etiology -Patient spiked a temperature (101.8), was tachycardic, and tachypenic, and has a Leukopenia (2.5 -> and is now 2.2)) -Sepsis physiology Improved somewhat but still tachycardic  -Will hold Benztropine given that she keeps spiking fevers -Check CXR, Urine Cx, Blood Cx x2 -CXR showed low volume film without acute abnornality -Give Boluses 2 Liters and will continue IVF hydration with D5 saline at rate 125 mils per hour -Follow-up cultures; Blood Cx's from 11/20 showed NGTD at 1 Day  -Hepatitis Panel Negative and Influenza Negative  -Empirically start antibiotics with IV vancomycin and IV cefepime -Check lactic acid level and pro calcitonin level was elevated at slightly 0.59 is trending down to 0.43 -> 0.38; LA was 2.1 and trended down to 1.7 -Continue to Monitor and Repeat CBC in AM   Hypophosphatemia -Patient's phosphorus level this morning was 2.3 -Replete with p.o. K-Phos Neutral 500 mg p.o. twice daily x2 doses again -Continue monitor and replete as necessary -Repeat phosphorus level in a.m.  Abnormal LFTs/Transaminitis -Patient's AST on admission was 393 and then trended down to 302 and is now back up to 327 -Patient ALT was 62 and then trended down to 49 and ALT is still 49 -Patient resides in a group home so unclear if she had a alcohol use or not -Placed on CIWA protocol empirically -Obtained right upper quadrant ultrasound showed coarse echotexture of the hepatic parenchyma suggesting diffuse hepatocellular disease such as possible hepatitis.  There is also hypoechoic areas that were noted around the portal triads suggesting edema potentially related to hepatic information or lymphatic obstruction secondary adenopathy at the porta hepatis region.  There is minimal ascites noted  around the liver and there is a 2.3 cm rounded hypoechoic solid abnormality that was seen in the right hepatic lobe which was not seen on prior CT scan of the abdomen and further  evaluation the MRI is recommended to evaluate for possible neoplasm.  There is a mild amount of sludge seen within the gallbladder lumen -Obtained hepatitis panel and was Negative -ANA reflex ordered; HIV Was negative -EtOH Level was Negative and Salicylate Level was Negative -UDS Negative  -We will obtain a MRI of the abdomen with and without contrast and this is currently being done -Discussed the case with Dr. Lucio Edward of gastroenterology and GI to see the patient in formal consultation today  -GI ordering additional workup during adding AMA, ASMA, EBV, CMV antibodies along with ceruloplasmin  Orthostatic Hypotension -In the setting of dehydration from poor p.o. intake and likely sepsis -Given 2 L of normal saline boluses and will continue with maintenance IV fluid hydration with D5 normal saline rate of 125 mL's per hour -Repeat orthostatic vital signs in a.m showed she did not drop her BP but became extremely tachycardic  -PT OT evaluation recommending skilled nursing facility  Bipolar 1 disorder -Currently not manic but does have a history of psychotic features -Continue with home medications including aripiprazole 50 mg p.o. daily and escitalopram 5 mg p.o. daily -Hold benztropine as it can cause fever -Continue monitor very carefully  Underweight -Continue with Ensure Enlive p.o. twice daily supplements -Nutrition is consulted for further evaluation recommendations  Hypocalcemia -Was given 1 amp of calcium gluconate yesterday -Likely in the setting of poor p.o. intake -We will repeat 1 amp of calcium gluconate -Ca2+ is 7.0  Thrombocytopenia -Unclear etiology but likely in the setting of sepsis -Lab work from a week ago was noted to have platelet counts of 141 and then trended down to 71 is now 52 this morning -Continue SCDs and hold anticoagulation -Continue to monitor for signs and symptoms of bleeding -We will repeat CBC in the a.m.  Normocytic Anemia -Patient's  hemoglobin/hematocrit went from 8.6/27.5 now 7.1/23.4 -Checked anemia panel and showed iron level 33, U IBC of 82, TIBC of 115, saturation ratios of 29%, ferritin level 5643, folate level of 9.3, and vitamin B12 level 270 -Type and screen and transfuse 1 unit of PRBCs -Continue to monitor for signs and symptoms of bleeding -Repeat CBC in the a.m.  Pancytopenia -As above patient's WBC is now 2.2, hemoglobin/hematocrit 7.1/23.4, and the count was 52 -Discussed the case with hematology Dr. Alen Blew who recommends continuing to monitor and watch and repeat CBC in the a.m.  Dr. Alen Blew does not feel strongly for a bone marrow biopsy at this time and recommends continuing to observe.  If counts drop further hematology will formally consult -Repeat CBC in a.m.  Hypomagnesemia -Patient magnesium level this morning was 1.6 -Replete with IV mag sulfate 2 g -Continue monitor replete as necessary -Repeat Magnesium Level in AM  DVT prophylaxis: SCDs given thrombocytopenia Code Status: FULL CODE Family Communication: No family present at bedside Disposition Plan: Remain inpatient for further work-up and evaluation  Consultants:   Gastroenterology  I discussed the case with hematology Dr. Alen Blew   Procedures:  RUQ U/S MRCP  Antimicrobials: Anti-infectives (From admission, onward)   Start     Dose/Rate Route Frequency Ordered Stop   06/01/18 1730  vancomycin (VANCOCIN) IVPB 750 mg/150 ml premix     750 mg 150 mL/hr over 60 Minutes Intravenous Every 24 hours 05/31/18 1629  05/31/18 1700  ceFEPIme (MAXIPIME) 1 g in sodium chloride 0.9 % 100 mL IVPB     1 g 200 mL/hr over 30 Minutes Intravenous Every 12 hours 05/31/18 1616     05/31/18 1630  vancomycin (VANCOCIN) IVPB 1000 mg/200 mL premix     1,000 mg 200 mL/hr over 60 Minutes Intravenous  Once 05/31/18 1616 05/31/18 1810     Subjective: Seen and examined at bedside that she was feeling mildly better but still felt very malaised and  fatigued.  Patient extremely weak and states that she does not know how this happened.  No chest pain, lightheadedness or dizziness.  No actual complaints the patient denies any abdominal pain.  Objective: Vitals:   06/01/18 1050 06/01/18 1107 06/01/18 1250 06/01/18 1348  BP: 93/61 (!) 95/59 118/85 120/82  Pulse: (!) 101 99 98 93  Resp: _0 Temp: 98.1 F (36.7 C) 98.1 F (36.7 C) 98.3 F (36.8 C) 98.7 F (37.1 C)  TempSrc: Oral Oral Oral Oral  SpO2: 99% 99% 99% 100%  Weight:      Height:        Intake/Output Summary (Last 24 hours) at 06/01/2018 1422 Last data filed at 06/01/2018 1250 Gross per 24 hour  Intake 3432.3 ml  Output 700 ml  Net 2732.3 ml   Filed Weights   05/30/18 0854  Weight: 46.3 kg   Examination: Physical Exam:  Constitutional: Thin African-American female currently no acute distress appears calm but does appear uncomfortable and very fatigued Eyes: Lids and conjunctive are normal.  Sclera anicteric ENMT: External ears and nose appear normal.  Grossly normal hearing Neck: Appears supple with no JVD Respiratory: Slightly diminished to auscultation bilaterally no appreciable wheezing, rales, rhonchi.  Patient was not tachypneic wheezing excess muscle breathe Cardiovascular: Slightly tachycardic rate but regular rhythm.  No appreciable murmurs, rubs, gallops.  No lower extremity edema noted Abdomen: Soft, mildly tender, nondistended.  Bowel sounds present in 4 quadrants GU: Deferred Musculoskeletal: No contractures or cyanosis.  Has very diminished strength Skin: No appreciable rashes or lesions on limited skin evaluation Neurologic: Cranial nerves II through XII grossly intact no appreciable focal deficits.  Romberg sign and cerebellar reflexes were not assessed Psychiatric: Normal judgment and insight.  Patient is awake alert and oriented x3.  Data Reviewed: I have personally reviewed following labs and imaging studies  CBC: Recent Labs  Lab  05/30/18 1010 05/31/18 0614 06/01/18 0627  WBC 3.7* 2.5* 2.2*  NEUTROABS  --   --  1.3*  HGB 8.6* 7.2* 7.1*  HCT 27.5* 24.0* 23.4*  MCV 87.9 89.9 89.7  PLT 71* 58* 52*   Basic Metabolic Panel: Recent Labs  Lab 05/30/18 1010 05/31/18 0614 05/31/18 0745 05/31/18 0749 06/01/18 0627  NA 138 139  --   --  139  K 4.2 3.5  --   --  3.6  CL 106 113*  --   --  113*  CO2 25 22  --   --  21*  GLUCOSE 90 105*  --   --  93  BUN 15 9  --   --  7  CREATININE 0.61 0.52  --   --  0.43*  CALCIUM 7.7* 7.3*  --   --  7.0*  MG  --   --  1.8  --  1.6*  PHOS  --   --   --  2.1* 2.3*   GFR: Estimated Creatinine Clearance: 59.4 mL/min (A) (by C-G formula based  on SCr of 0.43 mg/dL (L)). Liver Function Tests: Recent Labs  Lab 05/30/18 1010 05/31/18 0614 06/01/18 0627  AST 393* 302* 327*  ALT 62* 49* 49*  ALKPHOS 294* 238* 285*  BILITOT 0.5 0.4 0.4  PROT 7.1 5.8* 5.6*  ALBUMIN 2.3* 1.7* 1.6*   Recent Labs  Lab 05/30/18 1010 05/31/18 0614  LIPASE 61* 55*   No results for input(s): AMMONIA in the last 168 hours. Coagulation Profile: Recent Labs  Lab 06/01/18 1344  INR 0.90   Cardiac Enzymes: No results for input(s): CKTOTAL, CKMB, CKMBINDEX, TROPONINI in the last 168 hours. BNP (last 3 results) No results for input(s): PROBNP in the last 8760 hours. HbA1C: No results for input(s): HGBA1C in the last 72 hours. CBG: No results for input(s): GLUCAP in the last 168 hours. Lipid Profile: No results for input(s): CHOL, HDL, LDLCALC, TRIG, CHOLHDL, LDLDIRECT in the last 72 hours. Thyroid Function Tests: Recent Labs    05/31/18 0614  TSH 0.659   Anemia Panel: Recent Labs    06/01/18 0627 06/01/18 0628  VITAMINB12 270  --   FOLATE  --  9.3  FERRITIN 5,643*  --   TIBC 115*  --   IRON 33  --   RETICCTPCT 1.2  --    Sepsis Labs: Recent Labs  Lab 05/30/18 1835 05/31/18 1553 05/31/18 1610 05/31/18 1817 06/01/18 0627  PROCALCITON 0.59  --  0.43  --  0.38    LATICACIDVEN 1.0 2.1*  --  1.7  --     Recent Results (from the past 240 hour(s))  Culture, blood (routine x 2)     Status: None (Preliminary result)   Collection Time: 05/31/18  7:45 AM  Result Value Ref Range Status   Specimen Description   Final    BLOOD RIGHT ANTECUBITAL Performed at [facility], [place] [address], [place], [place] [address]    Special Requests   Final    BOTTLES DRAWN AEROBIC AND ANAEROBIC Blood Culture adequate volume Performed at [facility] [address] [place], [place] [address]    Culture   Final    NO GROWTH 1 DAY Performed at [facility], [place] [address] [place], [place] [address]    Report Status PENDING  Incomplete  Culture, blood (routine x 2)     Status: None (Preliminary result)   Collection Time: 05/31/18  7:49 AM  Result Value Ref Range Status   Specimen Description   Final    BLOOD RIGHT ARM Performed at [facility] [address] [place], [place] [address]    Special Requests   Final    BOTTLES DRAWN AEROBIC AND ANAEROBIC Blood Culture adequate volume Performed at [facility] [address], [place], [place] [address]    Culture   Final    NO GROWTH 1 DAY Performed at [facility], [place] [address] [place], [place] [address]    Report Status PENDING  Incomplete     Radiology Studies: Dg Chest Port 1 View  Result Date: 05/31/2018 CLINICAL DATA:  Fever EXAM: PORTABLE CHEST 1 VIEW COMPARISON:  05/30/2018 chest radiograph FINDINGS: This is a mildly low volume film. The cardiomediastinal silhouette is unremarkable. There is no evidence of focal airspace disease, pulmonary edema, suspicious pulmonary nodule/mass, pleural effusion, or pneumothorax. No acute bony abnormalities are identified. IMPRESSION: Low volume film without acute abnormality. Electronically Signed   By: Margarette Canada M.D.   On: 05/31/2018 15:16  US Abdomen Limited Ruq  Result  Date: 05/31/2018 CLINICAL DATA:  Abnormal liver function tests. EXAM: ULTRASOUND ABDOMEN LIMITED RIGHT UPPER QUADRANT COMPARISON:  CT scan of May 30, 2018. FINDINGS: Gallbladder: No gallstones or wall thickening visualized. No sonographic Murphy sign noted by sonographer. Mild amount of sludge is seen within gallbladder lumen. Common bile duct: Diameter: 3 mm which is within normal limits. Liver: 2.3 cm hypoechoic solid abnormality is seen in the right hepatic lobe which is not visualized on prior CT scan. Coarse echotexture of hepatic parenchyma is noted suggesting diffuse hepatocellular disease. Hypoechoic areas are noted around the portal triads suggesting edema potentially related to hepatic inflammation or adenopathy at porta hepatis region. Minimal ascites is noted around the liver. Portal vein is patent on color Doppler imaging with normal direction of blood flow towards the liver. IMPRESSION: Coarse echotexture of hepatic parenchyma is noted suggesting diffuse hepatocellular disease such as possible hepatitis. Hypoechoic areas are noted around the portal triads suggesting edema potentially related to hepatic inflammation, or lymphatic obstruction secondary to adenopathy at the porta hepatis region. Minimal ascites is noted around the liver. 2.3 cm rounded hypoechoic solid abnormality is seen in the right hepatic lobe which is not seen on prior CT scan. Further evaluation with MRI is recommended when patient can hold still to evaluate for possible neoplasm. Mild amount of sludge seen within gallbladder lumen. Electronically Signed   By: Marijo Conception, M.D.   On: 05/31/2018 12:42   Scheduled Meds: . sodium chloride   Intravenous Once  . ARIPiprazole  15 mg Oral Daily  . escitalopram  5 mg Oral Daily  . feeding supplement (ENSURE ENLIVE)  237 mL Oral BID BM  . folic acid  1 mg Oral Daily  . multivitamin with minerals  1 tablet Oral Daily  . phosphorus  500 mg Oral BID  . thiamine  100 mg Oral  Daily   Or  . thiamine  100 mg Intravenous Daily   Continuous Infusions: . ceFEPime (MAXIPIME) IV 1 g (06/01/18 0530)  . dextrose 5 % and 0.9% NaCl 125 mL/hr at 06/01/18 0529  . vancomycin      LOS: 2 days   Kerney Elbe, DO [facility] PAGER is on [place]  If 7PM-7AM, please contact night-coverage [email or web address] Password [email or web address] 06/01/2018, 2:22 PM

## 2018-06-02 ENCOUNTER — Other Ambulatory Visit: Payer: Self-pay | Admitting: Oncology

## 2018-06-02 DIAGNOSIS — R509 Fever, unspecified: Secondary | ICD-10-CM

## 2018-06-02 DIAGNOSIS — F319 Bipolar disorder, unspecified: Secondary | ICD-10-CM

## 2018-06-02 DIAGNOSIS — D61818 Other pancytopenia: Secondary | ICD-10-CM

## 2018-06-02 DIAGNOSIS — D649 Anemia, unspecified: Secondary | ICD-10-CM

## 2018-06-02 DIAGNOSIS — E43 Unspecified severe protein-calorie malnutrition: Secondary | ICD-10-CM

## 2018-06-02 DIAGNOSIS — I951 Orthostatic hypotension: Secondary | ICD-10-CM

## 2018-06-02 DIAGNOSIS — R740 Nonspecific elevation of levels of transaminase and lactic acid dehydrogenase [LDH]: Secondary | ICD-10-CM

## 2018-06-02 LAB — CBC WITH DIFFERENTIAL/PLATELET
Abs Immature Granulocytes: 0.12 10*3/uL — ABNORMAL HIGH (ref 0.00–0.07)
BASOS ABS: 0 10*3/uL (ref 0.0–0.1)
BASOS PCT: 1 %
EOS ABS: 0 10*3/uL (ref 0.0–0.5)
Eosinophils Relative: 1 %
HCT: 28.1 % — ABNORMAL LOW (ref 36.0–46.0)
Hemoglobin: 9.1 g/dL — ABNORMAL LOW (ref 12.0–15.0)
Immature Granulocytes: 6 %
LYMPHS ABS: 0.7 10*3/uL (ref 0.7–4.0)
Lymphocytes Relative: 35 %
MCH: 28.4 pg (ref 26.0–34.0)
MCHC: 32.4 g/dL (ref 30.0–36.0)
MCV: 87.8 fL (ref 80.0–100.0)
Monocytes Absolute: 0.2 10*3/uL (ref 0.1–1.0)
Monocytes Relative: 11 %
NEUTROS PCT: 46 %
NRBC: 2.6 % — AB (ref 0.0–0.2)
Neutro Abs: 0.9 10*3/uL — ABNORMAL LOW (ref 1.7–7.7)
PLATELETS: 43 10*3/uL — AB (ref 150–400)
RBC: 3.2 MIL/uL — AB (ref 3.87–5.11)
RDW: 15 % (ref 11.5–15.5)
WBC: 1.9 10*3/uL — AB (ref 4.0–10.5)

## 2018-06-02 LAB — ENA+DNA/DS+ANTICH+CENTRO+JO...
Anti JO-1: <0.2 AI (ref 0.0–0.9)
DS DNA AB: 135 [IU]/mL — AB (ref 0–9)
Ribonucleic Protein: 7.6 AI — ABNORMAL HIGH (ref 0.0–0.9)
SSA (Ro) (ENA) Antibody, IgG: >8 AI — ABNORMAL HIGH (ref 0.0–0.9)
Scleroderma (Scl-70) (ENA) Antibody, IgG: <0.2 AI (ref 0.0–0.9)

## 2018-06-02 LAB — ANTI-SMOOTH MUSCLE ANTIBODY, IGG: F-Actin IgG: 37 Units — ABNORMAL HIGH (ref 0–19)

## 2018-06-02 LAB — COMPREHENSIVE METABOLIC PANEL
ALT: 47 U/L — ABNORMAL HIGH (ref 0–44)
ANION GAP: 3 — AB (ref 5–15)
AST: 324 U/L — ABNORMAL HIGH (ref 15–41)
Albumin: 1.5 g/dL — ABNORMAL LOW (ref 3.5–5.0)
Alkaline Phosphatase: 330 U/L — ABNORMAL HIGH (ref 38–126)
BUN: 6 mg/dL (ref 6–20)
CALCIUM: 6.7 mg/dL — AB (ref 8.9–10.3)
CO2: 23 mmol/L (ref 22–32)
Chloride: 113 mmol/L — ABNORMAL HIGH (ref 98–111)
Creatinine, Ser: 0.48 mg/dL (ref 0.44–1.00)
Glucose, Bld: 88 mg/dL (ref 70–99)
Potassium: 3.4 mmol/L — ABNORMAL LOW (ref 3.5–5.1)
SODIUM: 139 mmol/L (ref 135–145)
TOTAL PROTEIN: 5.5 g/dL — AB (ref 6.5–8.1)
Total Bilirubin: 0.6 mg/dL (ref 0.3–1.2)

## 2018-06-02 LAB — BPAM RBC
BLOOD PRODUCT EXPIRATION DATE: 201912202359
ISSUE DATE / TIME: 201911211048
UNIT TYPE AND RH: 5100

## 2018-06-02 LAB — URINE CULTURE: Culture: 60000 — AB

## 2018-06-02 LAB — MAGNESIUM: MAGNESIUM: 1.9 mg/dL (ref 1.7–2.4)

## 2018-06-02 LAB — ANA W/REFLEX IF POSITIVE: ANA: POSITIVE — AB

## 2018-06-02 LAB — MITOCHONDRIAL ANTIBODIES: Mitochondrial M2 Ab, IgG: <20 Units (ref 0.0–20.0)

## 2018-06-02 LAB — CMV IGM: CMV IgM: <30 AU/mL (ref 0.0–29.9)

## 2018-06-02 LAB — TYPE AND SCREEN
ABO/RH(D): O POS
Antibody Screen: NEGATIVE
UNIT DIVISION: 0

## 2018-06-02 LAB — CERULOPLASMIN: Ceruloplasmin: 17.8 mg/dL — ABNORMAL LOW (ref 19.0–39.0)

## 2018-06-02 LAB — PROCALCITONIN: Procalcitonin: 0.33 ng/mL

## 2018-06-02 LAB — PHOSPHORUS: PHOSPHORUS: 2.9 mg/dL (ref 2.5–4.6)

## 2018-06-02 LAB — EPSTEIN-BARR VIRUS VCA, IGM

## 2018-06-02 MED ORDER — POTASSIUM CHLORIDE 10 MEQ/100ML IV SOLN
10.0000 meq | INTRAVENOUS | Status: AC
  Administered 2018-06-02 (×4): 10 meq via INTRAVENOUS
  Filled 2018-06-02 (×2): qty 100

## 2018-06-02 MED ORDER — CALCIUM GLUCONATE-NACL 1-0.675 GM/50ML-% IV SOLN
1.0000 g | Freq: Once | INTRAVENOUS | Status: AC
  Administered 2018-06-02: 1000 mg via INTRAVENOUS
  Filled 2018-06-02: qty 50

## 2018-06-02 NOTE — Consult Note (Addendum)
Chief Complaint: Patient was seen in consultation today for CT guided bone marrow biopsy Chief Complaint  Patient presents with  . Fall  . Weakness  . Dysuria    Referring Physician(s): Sheikh,O/Shadad,F  Supervising Physician: Markus Daft  Patient Status: [facility] - In-pt  History of Present Illness: Yvonne [name] is a 52 y.o. female with history of bipolar disorder who was recently admitted to [facility] with weakness, poor oral intake, recent fall triggered by lightheadedness, hypotension, elevated liver function tests and recent fever.  Patient also noted to be pancytopenic with elevated alk phos as well.  Blood/urine cultures pending.  No acute findings on imaging.  Request now received for CT-guided bone marrow biopsy for further evaluation.  Past Medical History:  Diagnosis Date  . Bipolar 1 disorder ([place])   . Medical history non-contributory   . Mental disorder   . No pertinent past medical history     Past Surgical History:  Procedure Laterality Date  . NO PAST SURGERIES      Allergies: Patient has no known allergies.  Medications: Prior to Admission medications   Medication Sig Start Date End Date Taking? Authorizing Provider  acetaminophen (TYLENOL) 325 MG tablet Take 650 mg by mouth 3 (three) times daily.   Yes [provider]  ARIPiprazole (ABILIFY) 15 MG tablet Take 1 tablet (15 mg total) by mouth daily. For mood control 06/23/17  Yes Lindell Spar I, NP  benztropine (COGENTIN) 0.5 MG tablet Take 1 tablet (0.5 mg total) by mouth daily. For prevention of drug induced tremors 06/23/17  Yes Nwoko, Agnes I, NP  escitalopram (LEXAPRO) 5 MG tablet Take 5 mg by mouth daily.   Yes [provider]  hydrOXYzine (ATARAX/VISTARIL) 25 MG tablet Take 25 mg by mouth 3 (three) times daily as needed for anxiety.   Yes [provider]  sennosides-docusate sodium (SENOKOT-S) 8.6-50 MG tablet Take 2 tablets by mouth at bedtime as needed for  constipation.   Yes [provider]  Vitamin D, Ergocalciferol, (DRISDOL) 1.25 MG (50000 UT) CAPS capsule Take 50,000 Units by mouth every 14 (fourteen) days.   Yes [provider]     Family History  Problem Relation Age of Onset  . Depression Sister   . Depression Brother   . Depression Sister   . CAD Mother   . Hypertension Father   . Diabetes Father   . Bipolar disorder Cousin     Social History   Socioeconomic History  . Marital status: Single    Spouse name: Not on file  . Number of children: Not on file  . Years of education: Not on file  . Highest education level: Not on file  Occupational History  . Not on file  Social Needs  . Financial resource strain: Not on file  . Food insecurity:    Worry: Not on file    Inability: Not on file  . Transportation needs:    Medical: Not on file    Non-medical: Not on file  Tobacco Use  . Smoking status: Never Smoker  . Smokeless tobacco: Never Used  Substance and Sexual Activity  . Alcohol use: No  . Drug use: No  . Sexual activity: Never  Lifestyle  . Physical activity:    Days per week: Not on file    Minutes per session: Not on file  . Stress: Not on file  Relationships  . Social connections:    Talks on phone: Not on file  Gets together: Not on file    Attends religious service: Not on file    Active member of club or organization: Not on file    Attends meetings of clubs or organizations: Not on file    Relationship status: Not on file  Other Topics Concern  . Not on file  Social History Narrative  . Not on file     Review of Systems see above: Currently denies fever, headache, chest pain, dyspnea, cough, abdominal/back pain, nausea, vomiting or bleeding.  She does have some lower extremity weakness and fatigue.  Vital Signs: BP 106/77 (BP Location: Left Arm)   Pulse (!) 102   Temp 100.2 F (37.9 C) (Oral)   Resp 18   Ht '5\' 2"'  (1.575 m)   Wt 102 lb (46.3 kg)   SpO2 97%   BMI  18.66 kg/m   Physical Exam awake, answers questions appropriately, flat affect; chest with slightly diminished breath sounds bases.  Heart with reg rate and rhythm.  Abdomen soft, positive bowel sounds, nontender.  No lower extremity edema.  Imaging: Dg Chest 2 View  Result Date: 05/30/2018 CLINICAL DATA:  Mental status change, possible fall, forehead hematoma, intermittent urinary incompetence. Generalized weakness. EXAM: CHEST - 2 VIEW COMPARISON:  PA and lateral chest x-ray of May 10, 2018 FINDINGS: The lungs are well-expanded. There is no focal infiltrate. There is no pleural effusion. The heart and pulmonary vascularity are normal. The mediastinum is normal in width. The bony thorax exhibits no acute abnormality. IMPRESSION: There is no active cardiopulmonary disease. Electronically Signed   By: David  Martinique M.D.   On: 05/30/2018 09:54   Dg Chest 2 View  Result Date: 05/10/2018 CLINICAL DATA:  Fever intermittently today. EXAM: CHEST - 2 VIEW COMPARISON:  None. FINDINGS: The heart size and mediastinal contours are within normal limits. Both lungs are clear. The visualized skeletal structures are unremarkable. IMPRESSION: No active cardiopulmonary disease. Electronically Signed   By: Marin Olp M.D.   On: 05/10/2018 18:51   Ct Head Wo Contrast  Result Date: 05/30/2018 CLINICAL DATA:  Generalized weakness, recent fall EXAM: CT HEAD WITHOUT CONTRAST TECHNIQUE: Contiguous axial images were obtained from the base of the skull through the vertex without intravenous contrast. COMPARISON:  None. FINDINGS: Brain: No evidence of acute infarction, hemorrhage, hydrocephalus, extra-axial collection or mass lesion/mass effect. Vascular: No hyperdense vessel or unexpected calcification. Skull: Normal. Negative for fracture or focal lesion. Sinuses/Orbits: Mild partial opacification of the right frontal sinus and bilateral sphenoid sinuses. Mastoid air cells are clear. Other: Small extracranial  hematoma overlying the left frontal bone (series 2/image 11). IMPRESSION: Small extracranial hematoma overlying the left frontal bone. No evidence of acute intracranial abnormality. Electronically Signed   By: Julian Hy M.D.   On: 05/30/2018 10:56   Ct Abdomen Pelvis W Contrast  Result Date: 05/30/2018 CLINICAL DATA:  Elevated liver function tests. Adult failure to thrive. EXAM: CT ABDOMEN AND PELVIS WITH CONTRAST TECHNIQUE: Multidetector CT imaging of the abdomen and pelvis was performed using the standard protocol following bolus administration of intravenous contrast. CONTRAST:  163m ISOVUE-300 IOPAMIDOL (ISOVUE-300) INJECTION 61% COMPARISON:  CT scan of April 04, 2010. FINDINGS: Lower chest: No acute abnormality. Hepatobiliary: No focal liver abnormality is seen. No gallstones, gallbladder wall thickening, or biliary dilatation. Pancreas: Unremarkable. No pancreatic ductal dilatation or surrounding inflammatory changes. Spleen: Normal in size without focal abnormality. Adrenals/Urinary Tract: Adrenal glands are unremarkable. Kidneys are normal, without renal calculi, focal lesion, or hydronephrosis. Bladder is unremarkable.  Stomach/Bowel: The stomach appears normal. There is no evidence of bowel obstruction or inflammation. The appendix is not visualized. Vascular/Lymphatic: Mildly enlarged mesenteric and retroperitoneal adenopathy is noted, all of which are less than 1 cm in size, and most likely are inflammatory in etiology. Reproductive: Multiple small uterine fibroids are noted. No adnexal abnormality is noted. Other: No abdominal wall hernia or abnormality. No abdominopelvic ascites. Musculoskeletal: No acute or significant osseous findings. IMPRESSION: Mildly enlarged mesenteric and retroperitoneal adenopathy is now noted, most likely inflammatory or reactive in etiology. Multiple small uterine fibroids are noted. Electronically Signed   By: Marijo Conception, M.D.   On: 05/30/2018 14:26    Mr Abdomen Mrcp Wo Contrast  Result Date: 06/01/2018 CLINICAL DATA:  Elevated liver function tests. Indeterminate lesion identified on ultrasound. MRI recommended. EXAM: MRI ABDOMEN WITHOUT CONTRAST  (INCLUDING MRCP) TECHNIQUE: Multiplanar multisequence MR imaging of the abdomen was performed. Heavily T2-weighted images of the biliary and pancreatic ducts were obtained, and three-dimensional MRCP images were rendered by post processing. COMPARISON:  Ultrasound 05/31/2018, [place] [address] FINDINGS: Patient unable to complete the entire exam due to pain. Lower chest:  Bilateral small effusions and bibasilar atelectasis. Hepatobiliary: No focal hepatic lesion identified. No lesion corresponds to the hypodense lesion on comparison CT. Suspect lesion may represent focal fatty sparing. Opposed phase imaging was not performed due to patient discomfort. There is no intrahepatic or extrahepatic biliary duct dilatation. Pancreas: Normal pancreatic parenchymal intensity. No ductal dilatation or inflammation. Spleen: Spleen is mildly enlarged Adrenals/urinary tract: Adrenal glands and kidneys are normal. Stomach/Bowel: Stomach and limited of the small bowel is unremarkable Vascular/Lymphatic: Abdominal aortic normal caliber. No retroperitoneal periportal lymphadenopathy. Musculoskeletal: No aggressive osseous lesion IMPRESSION: 1. No hepatic lesion identified. All sequences could not be completed due to patient termination of exam due to pain. Opposed phase imaging not performed or postcontrast imaging. Lesion not identified on the provided sequences. Lesion may represent focal fatty sparing. As lesion is occult on CT and MRI not tolerated in this patient patient (at least currently), recommend follow-up ultrasound to demonstrate stability in 3-6 months. 2. Bibasilar atelectasis and effusions. 3. No biliary duct dilatation gallbladder sludge Electronically Signed   By: Suzy Bouchard M.D.   On: 06/01/2018 15:18   Mr 3d  Recon At Scanner  Result Date: 06/01/2018 CLINICAL DATA:  Elevated liver function tests. Indeterminate lesion identified on ultrasound. MRI recommended. EXAM: MRI ABDOMEN WITHOUT CONTRAST  (INCLUDING MRCP) TECHNIQUE: Multiplanar multisequence MR imaging of the abdomen was performed. Heavily T2-weighted images of the biliary and pancreatic ducts were obtained, and three-dimensional MRCP images were rendered by post processing. COMPARISON:  Ultrasound 05/31/2018, [place] [address] FINDINGS: Patient unable to complete the entire exam due to pain. Lower chest:  Bilateral small effusions and bibasilar atelectasis. Hepatobiliary: No focal hepatic lesion identified. No lesion corresponds to the hypodense lesion on comparison CT. Suspect lesion may represent focal fatty sparing. Opposed phase imaging was not performed due to patient discomfort. There is no intrahepatic or extrahepatic biliary duct dilatation. Pancreas: Normal pancreatic parenchymal intensity. No ductal dilatation or inflammation. Spleen: Spleen is mildly enlarged Adrenals/urinary tract: Adrenal glands and kidneys are normal. Stomach/Bowel: Stomach and limited of the small bowel is unremarkable Vascular/Lymphatic: Abdominal aortic normal caliber. No retroperitoneal periportal lymphadenopathy. Musculoskeletal: No aggressive osseous lesion IMPRESSION: 1. No hepatic lesion identified. All sequences could not be completed due to patient termination of exam due to pain. Opposed phase imaging not performed or postcontrast imaging. Lesion not identified on the provided sequences.  Lesion may represent focal fatty sparing. As lesion is occult on CT and MRI not tolerated in this patient patient (at least currently), recommend follow-up ultrasound to demonstrate stability in 3-6 months. 2. Bibasilar atelectasis and effusions. 3. No biliary duct dilatation gallbladder sludge Electronically Signed   By: Suzy Bouchard M.D.   On: 06/01/2018 15:18   Dg Chest Port 1  View  Result Date: 05/31/2018 CLINICAL DATA:  Fever EXAM: PORTABLE CHEST 1 VIEW COMPARISON:  05/30/2018 chest radiograph FINDINGS: This is a mildly low volume film. The cardiomediastinal silhouette is unremarkable. There is no evidence of focal airspace disease, pulmonary edema, suspicious pulmonary nodule/mass, pleural effusion, or pneumothorax. No acute bony abnormalities are identified. IMPRESSION: Low volume film without acute abnormality. Electronically Signed   By: Margarette Canada M.D.   On: 05/31/2018 15:16   US Abdomen Limited Ruq  Result Date: 05/31/2018 CLINICAL DATA:  Abnormal liver function tests. EXAM: ULTRASOUND ABDOMEN LIMITED RIGHT UPPER QUADRANT COMPARISON:  CT scan of May 30, 2018. FINDINGS: Gallbladder: No gallstones or wall thickening visualized. No sonographic Murphy sign noted by sonographer. Mild amount of sludge is seen within gallbladder lumen. Common bile duct: Diameter: 3 mm which is within normal limits. Liver: 2.3 cm hypoechoic solid abnormality is seen in the right hepatic lobe which is not visualized on prior CT scan. Coarse echotexture of hepatic parenchyma is noted suggesting diffuse hepatocellular disease. Hypoechoic areas are noted around the portal triads suggesting edema potentially related to hepatic inflammation or adenopathy at porta hepatis region. Minimal ascites is noted around the liver. Portal vein is patent on color Doppler imaging with normal direction of blood flow towards the liver. IMPRESSION: Coarse echotexture of hepatic parenchyma is noted suggesting diffuse hepatocellular disease such as possible hepatitis. Hypoechoic areas are noted around the portal triads suggesting edema potentially related to hepatic inflammation, or lymphatic obstruction secondary to adenopathy at the porta hepatis region. Minimal ascites is noted around the liver. 2.3 cm rounded hypoechoic solid abnormality is seen in the right hepatic lobe which is not seen on prior CT scan.  Further evaluation with MRI is recommended when patient can hold still to evaluate for possible neoplasm. Mild amount of sludge seen within gallbladder lumen. Electronically Signed   By: Marijo Conception, M.D.   On: 05/31/2018 12:42    Labs:  CBC: Recent Labs    05/30/18 1010 05/31/18 0614 06/01/18 0627 06/02/18 0611  WBC 3.7* 2.5* 2.2* 1.9*  HGB 8.6* 7.2* 7.1* 9.1*  HCT 27.5* 24.0* 23.4* 28.1*  PLT 71* 58* 52* 43*    COAGS: Recent Labs    06/01/18 1344  INR 0.90    BMP: Recent Labs    05/30/18 1010 05/31/18 0614 06/01/18 0627 06/02/18 0611  NA 138 139 139 139  K 4.2 3.5 3.6 3.4*  CL 106 113* 113* 113*  CO2 25 22 21* 23  GLUCOSE 90 105* 93 88  BUN '15 9 7 6  ' CALCIUM 7.7* 7.3* 7.0* 6.7*  CREATININE 0.61 0.52 0.43* 0.48  GFRNONAA >60 >60 >60 >60  GFRAA >60 >60 >60 >60    LIVER FUNCTION TESTS: Recent Labs    05/30/18 1010 05/31/18 0614 06/01/18 0627 06/02/18 0611  BILITOT 0.5 0.4 0.4 0.6  AST 393* 302* 327* 324*  ALT 62* 49* 49* 47*  ALKPHOS 294* 238* 285* 330*  PROT 7.1 5.8* 5.6* 5.5*  ALBUMIN 2.3* 1.7* 1.6* 1.5*    TUMOR MARKERS: No results for input(s): AFPTM, CEA, CA199, CHROMGRNA in the last  8760 hours.  Assessment and Plan:  52 y.o. female with history of bipolar disorder who was recently admitted to [facility] with weakness, poor oral intake, recent fall triggered by lightheadedness, hypotension, elevated liver function tests and recent fever.  Patient also noted to be pancytopenic with elevated alk phos as well.  Blood/urine cultures pending.  No acute findings on imaging.  Request now received for CT-guided bone marrow biopsy for further evaluation.Risks and benefits discussed with the patient including, but not limited to bleeding, infection, damage to adjacent structures or low yield requiring additional tests.  All of the patient's questions were answered, patient is agreeable to proceed. Consent signed and in chart.  Procedure tent  scheduled for 11/25 am.   Thank you for this interesting consult.  I greatly enjoyed meeting Teela Narducci and look forward to participating in their care.  A copy of this report was sent to the requesting provider on this date.  Electronically Signed: D. Rowe Robert, PA-C 06/02/2018, 12:58 PM   I spent a total of 25 minutes  in face to face in clinical consultation, greater than 50% of which was counseling/coordinating care for CT guided bone marrow biopsy

## 2018-06-02 NOTE — Progress Notes (Signed)
Physical Therapy Treatment Patient Details Name: Yvonne BalsamMelvine [name] MRN: [id number] DOB: Apr 14, 1966 Today's Date: 06/02/2018    History of Present Illness 52 yo female with H/O bipolar DO, presented to ED after a fall, orthostatic hypotension, fever. patient resides in a group home.    PT Comments    Patient progressing this session improved mobility and participation.  Still requiring +2 A for safety due to weakness, HR up to 132 and poor balance and activity tolerance.  She remains appropriate for SNF level rehab at d/c.    Follow Up Recommendations  SNF;Supervision/Assistance - 24 hour     Equipment Recommendations  None recommended by PT    Recommendations for Other Services       Precautions / Restrictions Precautions Precautions: Fall    Mobility  Bed Mobility Overal bed mobility: Needs Assistance Bed Mobility: Rolling;Sidelying to Sit Rolling: Mod assist Sidelying to sit: Mod assist;HOB elevated   Sit to supine: Mod assist   General bed mobility comments: cues for technique, assist to bring legs off bed and lift trunk; to supine assist for legs into bed and positioning once supine  Transfers Overall transfer level: Needs assistance Equipment used: 1 person hand held assist Transfers: Sit to/from Stand Sit to Stand: Mod assist;+2 safety/equipment         General transfer comment: stood from EOB lifting help and assist to step to recliner and back to bed.; RN max 132  Ambulation/Gait                 Stairs             Wheelchair Mobility    Modified Rankin (Stroke Patients Only)       Balance                                            Cognition Arousal/Alertness: Awake/alert Behavior During Therapy: Flat affect Overall Cognitive Status: Impaired/Different from baseline Area of Impairment: Attention;Problem solving                 Orientation Level: Disoriented to;Person           Problem Solving: Slow  processing;Decreased initiation;Requires verbal cues;Requires tactile cues        Exercises      General Comments        Pertinent Vitals/Pain Pain Assessment: Faces Faces Pain Scale: Hurts even more Pain Location: arms and legs with AAROM Pain Descriptors / Indicators: Discomfort;Grimacing Pain Intervention(s): Monitored during session;Repositioned;Limited activity within patient's tolerance    Home Living                      Prior Function            PT Goals (current goals can now be found in the care plan section) Progress towards PT goals: Progressing toward goals    Frequency    Min 2X/week      PT Plan Current plan remains appropriate    Co-evaluation PT/OT/SLP Co-Evaluation/Treatment: Yes Reason for Co-Treatment: For patient/therapist safety;To address functional/ADL transfers;Complexity of the patient's impairments (multi-system involvement) PT goals addressed during session: Mobility/safety with mobility;Balance;Strengthening/ROM        AM-PAC PT "6 Clicks" Daily Activity  Outcome Measure  Difficulty turning over in bed (including adjusting bedclothes, sheets and blankets)?: Unable Difficulty moving from lying on back to sitting on the side of  the bed? : Unable Difficulty sitting down on and standing up from a chair with arms (e.g., wheelchair, bedside commode, etc,.)?: Unable Help needed moving to and from a bed to chair (including a wheelchair)?: A Lot Help needed walking in hospital room?: Total Help needed climbing 3-5 steps with a railing? : Total 6 Click Score: 7    End of Session   Activity Tolerance: Patient limited by fatigue Patient left: with call bell/phone within reach;in bed;with bed alarm set   PT Visit Diagnosis: Other abnormalities of gait and mobility (R26.89);Other symptoms and signs involving the nervous system (R29.898)     Time: 9604-5409 PT Time Calculation (min) (ACUTE ONLY): 31 min  Charges:  $Therapeutic  Activity: 8-22 mins                     Sheran Lawless, [facility] [phone] 06/02/2018    Elray Mcgregor 06/02/2018, 11:35 AM

## 2018-06-02 NOTE — Consult Note (Signed)
[place]  [phone] [phone]     ID: Chandra Batch DOB: [birth year]  MR#: [id number]  SKA#:[id number]  Patient Care Team: Leonard Downing, MD as PCP - General (Family Medicine) Chauncey Cruel, MD OTHER MD:  CHIEF COMPLAINT: pancytopenia  CURRENT TREATMENT: workup in progress   HISTORY OF CURRENT ILLNESS:  s [place] was admitted with suicidal ideation 05/09/2018. CBC at that time showed a WBC of 3.3 with unremarkable differential, Hb 8.7 with MCV 87.9 and some NRBCs noted, and platelets 186 K. She was discharged but readmitted 11/19 after a fall. In the ED she was orthostatic and subsequently the patient developed a temperature >101, with cultures negative so far. LFTs were elevated (essentially WNL 3 weeks before) leading to GI w/u with [place] suggesting a possible mass, MRCP suggesting the "mass" may be fat sparing, and GI working the patient up for various causes of hepatitis (so far negative).  We were consulted because of the following:  Results for ALAYA, IVERSON (MRN [id number]) as of 06/02/2018 21:38  Ref. Range 05/30/2018 10:10 05/31/2018 06:14 06/01/2018 06:27 06/02/2018 06:11  WBC Latest Ref Range: 4.0 - 10.5 K/uL 3.7 (L) 2.5 (L) 2.2 (L) 1.9 (L)  Results for LOVELL, ROE (MRN [id number]) as of 06/02/2018 21:38  Ref. Range 05/23/2018 20:30 05/30/2018 10:10 05/31/2018 06:14 06/01/2018 06:27  Hemoglobin Latest Ref Range: 12.0 - 15.0 g/dL 10.0 (L) 8.6 (L) 7.2 (L) 7.1 (L)  Results for TAHANI, POTIER (MRN [id number]) as of 06/02/2018 21:38  Ref. Range 05/10/2018 23:09 05/23/2018 20:30 05/30/2018 10:10 05/31/2018 06:14 06/01/2018 06:27 06/02/2018 06:11  Platelets Latest Ref Range: 150 - 400 K/uL 186 141 (L) 71 (L) 58 (L) 52 (L) 43 (L)   The patient's subsequent history is as detailed below.  INTERVAL HISTORY: I met with the patient in her room 06/02/2018. No family present   REVIEW OF SYSTEMS: The patient tells me she was admitted  because she felt weak. She did not elaborate. When prompted about history of a fall she stated she did fall. Again provided no details. She appeared mistrustful of MD, more like someone with schizophrenia than bipolar disorder in my view. Offered no new symptoms but did state she is "better."  PAST MEDICAL HISTORY: Past Medical History:  Diagnosis Date  . Bipolar 1 disorder ([place])   . Medical history non-contributory   . Mental disorder   . No pertinent past medical history     PAST SURGICAL HISTORY: Past Surgical History:  Procedure Laterality Date  . NO PAST SURGERIES      FAMILY HISTORY Family History  Problem Relation Age of Onset  . Depression Sister   . Depression Brother   . Depression Sister   . CAD Mother   . Hypertension Father   . Diabetes Father   . Bipolar disorder Cousin    SOCIAL HISTORY:  The patient lives in a group home with 4 other residents. Staff does the cooking and there have been no recent changes in staff, diet or medications that she can tell me about.    ADVANCED DIRECTIVES: not adressed   HEALTH MAINTENANCE: Social History   Tobacco Use  . Smoking status: Never Smoker  . Smokeless tobacco: Never Used  Substance Use Topics  . Alcohol use: No  . Drug use: No      No Known Allergies  Current Facility-Administered Medications  Medication Dose Route Frequency Provider Last Rate Last Dose  . ARIPiprazole (ABILIFY) tablet 15 mg  15 mg Oral  Daily Annita Brod, MD   15 mg at 06/02/18 6948  . ceFEPIme (MAXIPIME) 1 g in sodium chloride 0.9 % 100 mL IVPB  1 g Intravenous Q12H Sheikh, Georgina [place], DO 200 mL/hr at 06/02/18 1621 1 g at 06/02/18 1621  . dextrose 5 %-0.9 % sodium chloride infusion   Intravenous Continuous Raiford Noble [place], DO 75 mL/hr at 06/02/18 1541    . escitalopram (LEXAPRO) tablet 5 mg  5 mg Oral Daily Annita Brod, MD   5 mg at 06/02/18 0906  . feeding supplement (ENSURE ENLIVE) (ENSURE ENLIVE) liquid 237 mL  237 mL  Oral BID BM Annita Brod, MD      . folic acid (FOLVITE) tablet 1 mg  1 mg Oral Daily Raiford Noble [place], DO   1 mg at 06/02/18 5462  . hydrOXYzine (ATARAX/VISTARIL) tablet 25 mg  25 mg Oral TID PRN Annita Brod, MD      . ibuprofen (ADVIL,MOTRIN) tablet 400 mg  400 mg Oral Q6H PRN Raiford Noble [place], DO   400 mg at 06/02/18 0555  . LORazepam (ATIVAN) tablet 1 mg  1 mg Oral Q6H PRN Raiford Noble [place], DO       Or  . LORazepam (ATIVAN) injection 1 mg  1 mg Intravenous Q6H PRN Raiford Noble [place], DO      . multivitamin with minerals tablet 1 tablet  1 tablet Oral Daily Raiford Noble [place], [place]   1 tablet at 06/02/18 7035  . ondansetron (ZOFRAN) tablet 4 mg  4 mg Oral Q6H PRN Annita Brod, MD       Or  . ondansetron [facility]) injection 4 mg  4 mg Intravenous Q6H PRN Annita Brod, MD      . polyethylene glycol (MIRALAX / GLYCOLAX) packet 17 g  17 g Oral Daily PRN Annita Brod, MD      . thiamine (VITAMIN B-1) tablet 100 mg  100 mg Oral Daily Raiford Noble [place], DO   100 mg at 06/02/18 0093   Or  . thiamine (B-1) injection 100 mg  100 mg Intravenous Daily Raiford Noble [place], DO   100 mg at 05/31/18 1240  . vancomycin (VANCOCIN) IVPB 750 mg/150 ml premix  750 mg Intravenous Q24H Raiford Noble [place], DO 150 mL/hr at 06/02/18 1759 750 mg at 06/02/18 1759    OBJECTIVE: middle aged African American woman examined in bed  Vitals:   06/02/18 0538 06/02/18 1338  BP: 106/77 106/78  Pulse: (!) 102 94  Resp: 18 14  Temp: 100.2 F (37.9 C) 98.6 F (37 C)  SpO2: 97% 100%     Body mass index is 18.66 kg/m.   Wt Readings from Last 3 Encounters:  05/30/18 102 lb (46.3 kg)  06/08/17 96 lb (43.5 kg)  05/31/17 87 lb (39.5 kg)    Lungs no rales or rhonchi, auscultated anterolaterally Heart regular rate and rhythm Abd soft, nontender, positive bowel sounds MSK no focal spinal tenderness, no joint edema Neuro: non-focal, guarded affect Breasts: deferred   LAB  RESULTS:  CMP     Component Value Date/Time   NA 139 06/02/2018 0611   K 3.4 (L) 06/02/2018 0611   CL 113 (H) 06/02/2018 0611   CO2 23 06/02/2018 0611   GLUCOSE 88 06/02/2018 0611   BUN 6 06/02/2018 0611   CREATININE 0.48 06/02/2018 0611   CALCIUM 6.7 (L) 06/02/2018 0611   PROT 5.5 (L) 06/02/2018 0611   ALBUMIN 1.5 (L) 06/02/2018 8182  AST 324 (H) 06/02/2018 0611   ALT 47 (H) 06/02/2018 0611   ALKPHOS 330 (H) 06/02/2018 0611   BILITOT 0.6 06/02/2018 0611   GFRNONAA >60 06/02/2018 0611   GFRAA >60 06/02/2018 0611    No results found for: TOTALPROTELP, ALBUMINELP, A1GS, A2GS, BETS, BETA2SER, GAMS, MSPIKE, SPEI  No results found for: KPAFRELGTCHN, LAMBDASER, [facility]  Lab Results  Component Value Date   WBC 1.9 (L) 06/02/2018   NEUTROABS 0.9 (L) 06/02/2018   HGB 9.1 (L) 06/02/2018   HCT 28.1 (L) 06/02/2018   MCV 87.8 06/02/2018   PLT 43 (L) 06/02/2018    '@LASTCHEMISTRY' @  No results found for: LABCA2  No components found for: CXKGYJ856  Recent Labs  Lab 06/01/18 1344  INR 0.90    No results found for: LABCA2  No results found for: DJS970  No results found for: YOV785  No results found for: YIF027  No results found for: CA2729  No components found for: HGQUANT  No results found for: CEA1 / No results found for: CEA1   No results found for: AFPTUMOR  No results found for: [place]  No results found for: PSA1  No results displayed because visit has over 200 results.      (this displays the last labs from the last 3 days)  No results found for: TOTALPROTELP, ALBUMINELP, A1GS, A2GS, BETS, BETA2SER, GAMS, MSPIKE, SPEI (this displays SPEP labs)  No results found for: KPAFRELGTCHN, LAMBDASER, KAPLAMBRATIO (kappa/lambda light chains)  No results found for: HGBA, HGBA2QUANT, HGBFQUANT, HGBSQUAN (Hemoglobinopathy evaluation)   No results found for: LDH  Lab Results  Component Value Date   IRON 33 06/01/2018   TIBC 115 (L) 06/01/2018    IRONPCTSAT 29 06/01/2018   (Iron and TIBC)  Lab Results  Component Value Date   FERRITIN 5,643 (H) 06/01/2018    Urinalysis    Component Value Date/Time   COLORURINE YELLOW 05/24/2018 0111   APPEARANCEUR HAZY (A) 05/24/2018 0111   LABSPEC 1.017 05/24/2018 0111   PHURINE 5.0 05/24/2018 0111   GLUCOSEU NEGATIVE 05/24/2018 0111   HGBUR NEGATIVE 05/24/2018 0111   BILIRUBINUR NEGATIVE 05/24/2018 0111   KETONESUR NEGATIVE 05/24/2018 0111   PROTEINUR NEGATIVE 05/24/2018 0111   UROBILINOGEN 0.2 02/27/2014 0501   NITRITE NEGATIVE 05/24/2018 0111   LEUKOCYTESUR SMALL (A) 05/24/2018 0111     STUDIES: Dg Chest 2 View  Result Date: 05/30/2018 CLINICAL DATA:  Mental status change, possible fall, forehead hematoma, intermittent urinary incompetence. Generalized weakness. EXAM: CHEST - 2 VIEW COMPARISON:  PA and lateral chest x-ray of May 10, 2018 FINDINGS: The lungs are well-expanded. There is no focal infiltrate. There is no pleural effusion. The heart and pulmonary vascularity are normal. The mediastinum is normal in width. The bony thorax exhibits no acute abnormality. IMPRESSION: There is no active cardiopulmonary disease. Electronically Signed   By: David  Martinique M.D.   On: 05/30/2018 09:54   Dg Chest 2 View  Result Date: 05/10/2018 CLINICAL DATA:  Fever intermittently today. EXAM: CHEST - 2 VIEW COMPARISON:  None. FINDINGS: The heart size and mediastinal contours are within normal limits. Both lungs are clear. The visualized skeletal structures are unremarkable. IMPRESSION: No active cardiopulmonary disease. Electronically Signed   By: Marin Olp M.D.   On: 05/10/2018 18:51   Ct Head Wo Contrast  Result Date: 05/30/2018 CLINICAL DATA:  Generalized weakness, recent fall EXAM: CT HEAD WITHOUT CONTRAST TECHNIQUE: Contiguous axial images were obtained from the base of the skull through the vertex without intravenous contrast.  COMPARISON:  None. FINDINGS: Brain: No evidence of acute  infarction, hemorrhage, hydrocephalus, extra-axial collection or mass lesion/mass effect. Vascular: No hyperdense vessel or unexpected calcification. Skull: Normal. Negative for fracture or focal lesion. Sinuses/Orbits: Mild partial opacification of the right frontal sinus and bilateral sphenoid sinuses. Mastoid air cells are clear. Other: Small extracranial hematoma overlying the left frontal bone (series 2/image 11). IMPRESSION: Small extracranial hematoma overlying the left frontal bone. No evidence of acute intracranial abnormality. Electronically Signed   By: Julian Hy M.D.   On: 05/30/2018 10:56   Ct Abdomen Pelvis W Contrast  Result Date: 05/30/2018 CLINICAL DATA:  Elevated liver function tests. Adult failure to thrive. EXAM: CT ABDOMEN AND PELVIS WITH CONTRAST TECHNIQUE: Multidetector CT imaging of the abdomen and pelvis was performed using the standard protocol following bolus administration of intravenous contrast. CONTRAST:  137m ISOVUE-300 IOPAMIDOL (ISOVUE-300) INJECTION 61% COMPARISON:  CT scan of April 04, 2010. FINDINGS: Lower chest: No acute abnormality. Hepatobiliary: No focal liver abnormality is seen. No gallstones, gallbladder wall thickening, or biliary dilatation. Pancreas: Unremarkable. No pancreatic ductal dilatation or surrounding inflammatory changes. Spleen: Normal in size without focal abnormality. Adrenals/Urinary Tract: Adrenal glands are unremarkable. Kidneys are normal, without renal calculi, focal lesion, or hydronephrosis. Bladder is unremarkable. Stomach/Bowel: The stomach appears normal. There is no evidence of bowel obstruction or inflammation. The appendix is not visualized. Vascular/Lymphatic: Mildly enlarged mesenteric and retroperitoneal adenopathy is noted, all of which are less than 1 cm in size, and most likely are inflammatory in etiology. Reproductive: Multiple small uterine fibroids are noted. No adnexal abnormality is noted. Other: No abdominal wall  hernia or abnormality. No abdominopelvic ascites. Musculoskeletal: No acute or significant osseous findings. IMPRESSION: Mildly enlarged mesenteric and retroperitoneal adenopathy is now noted, most likely inflammatory or reactive in etiology. Multiple small uterine fibroids are noted. Electronically Signed   By: JMarijo Conception M.D.   On: 05/30/2018 14:26   Mr Abdomen Mrcp Wo Contrast  Result Date: 06/01/2018 CLINICAL DATA:  Elevated liver function tests. Indeterminate lesion identified on ultrasound. MRI recommended. EXAM: MRI ABDOMEN WITHOUT CONTRAST  (INCLUDING MRCP) TECHNIQUE: Multiplanar multisequence MR imaging of the abdomen was performed. Heavily T2-weighted images of the biliary and pancreatic ducts were obtained, and three-dimensional MRCP images were rendered by post processing. COMPARISON:  Ultrasound 05/31/2018, CT [id number]: Patient unable to complete the entire exam due to pain. Lower chest:  Bilateral small effusions and bibasilar atelectasis. Hepatobiliary: No focal hepatic lesion identified. No lesion corresponds to the hypodense lesion on comparison CT. Suspect lesion may represent focal fatty sparing. Opposed phase imaging was not performed due to patient discomfort. There is no intrahepatic or extrahepatic biliary duct dilatation. Pancreas: Normal pancreatic parenchymal intensity. No ductal dilatation or inflammation. Spleen: Spleen is mildly enlarged Adrenals/urinary tract: Adrenal glands and kidneys are normal. Stomach/Bowel: Stomach and limited of the small bowel is unremarkable Vascular/Lymphatic: Abdominal aortic normal caliber. No retroperitoneal periportal lymphadenopathy. Musculoskeletal: No aggressive osseous lesion IMPRESSION: 1. No hepatic lesion identified. All sequences could not be completed due to patient termination of exam due to pain. Opposed phase imaging not performed or postcontrast imaging. Lesion not identified on the provided sequences. Lesion may represent  focal fatty sparing. As lesion is occult on CT and MRI not tolerated in this patient patient (at least currently), recommend follow-up ultrasound to demonstrate stability in 3-6 months. 2. Bibasilar atelectasis and effusions. 3. No biliary duct dilatation gallbladder sludge Electronically Signed   By: SSuzy BouchardM.D.   On:  06/01/2018 15:18   Mr 3d Recon At Scanner  Result Date: 06/01/2018 CLINICAL DATA:  Elevated liver function tests. Indeterminate lesion identified on ultrasound. MRI recommended. EXAM: MRI ABDOMEN WITHOUT CONTRAST  (INCLUDING MRCP) TECHNIQUE: Multiplanar multisequence MR imaging of the abdomen was performed. Heavily T2-weighted images of the biliary and pancreatic ducts were obtained, and three-dimensional MRCP images were rendered by post processing. COMPARISON:  Ultrasound 05/31/2018, [place] [id number] FINDINGS: Patient unable to complete the entire exam due to pain. Lower chest:  Bilateral small effusions and bibasilar atelectasis. Hepatobiliary: No focal hepatic lesion identified. No lesion corresponds to the hypodense lesion on comparison CT. Suspect lesion may represent focal fatty sparing. Opposed phase imaging was not performed due to patient discomfort. There is no intrahepatic or extrahepatic biliary duct dilatation. Pancreas: Normal pancreatic parenchymal intensity. No ductal dilatation or inflammation. Spleen: Spleen is mildly enlarged Adrenals/urinary tract: Adrenal glands and kidneys are normal. Stomach/Bowel: Stomach and limited of the small bowel is unremarkable Vascular/Lymphatic: Abdominal aortic normal caliber. No retroperitoneal periportal lymphadenopathy. Musculoskeletal: No aggressive osseous lesion IMPRESSION: 1. No hepatic lesion identified. All sequences could not be completed due to patient termination of exam due to pain. Opposed phase imaging not performed or postcontrast imaging. Lesion not identified on the provided sequences. Lesion may represent focal fatty  sparing. As lesion is occult on CT and MRI not tolerated in this patient patient (at least currently), recommend follow-up ultrasound to demonstrate stability in 3-6 months. 2. Bibasilar atelectasis and effusions. 3. No biliary duct dilatation gallbladder sludge Electronically Signed   By: Suzy Bouchard M.D.   On: 06/01/2018 15:18   Dg Chest Port 1 View  Result Date: 05/31/2018 CLINICAL DATA:  Fever EXAM: PORTABLE CHEST 1 VIEW COMPARISON:  05/30/2018 chest radiograph FINDINGS: This is a mildly low volume film. The cardiomediastinal silhouette is unremarkable. There is no evidence of focal airspace disease, pulmonary edema, suspicious pulmonary nodule/mass, pleural effusion, or pneumothorax. No acute bony abnormalities are identified. IMPRESSION: Low volume film without acute abnormality. Electronically Signed   By: Margarette Canada M.D.   On: 05/31/2018 15:16   US Abdomen Limited Ruq  Result Date: 05/31/2018 CLINICAL DATA:  Abnormal liver function tests. EXAM: ULTRASOUND ABDOMEN LIMITED RIGHT UPPER QUADRANT COMPARISON:  CT scan of May 30, 2018. FINDINGS: Gallbladder: No gallstones or wall thickening visualized. No sonographic Murphy sign noted by sonographer. Mild amount of sludge is seen within gallbladder lumen. Common bile duct: Diameter: 3 mm which is within normal limits. Liver: 2.3 cm hypoechoic solid abnormality is seen in the right hepatic lobe which is not visualized on prior CT scan. Coarse echotexture of hepatic parenchyma is noted suggesting diffuse hepatocellular disease. Hypoechoic areas are noted around the portal triads suggesting edema potentially related to hepatic inflammation or adenopathy at porta hepatis region. Minimal ascites is noted around the liver. Portal vein is patent on color Doppler imaging with normal direction of blood flow towards the liver. IMPRESSION: Coarse echotexture of hepatic parenchyma is noted suggesting diffuse hepatocellular disease such as possible  hepatitis. Hypoechoic areas are noted around the portal triads suggesting edema potentially related to hepatic inflammation, or lymphatic obstruction secondary to adenopathy at the porta hepatis region. Minimal ascites is noted around the liver. 2.3 cm rounded hypoechoic solid abnormality is seen in the right hepatic lobe which is not seen on prior CT scan. Further evaluation with MRI is recommended when patient can hold still to evaluate for possible neoplasm. Mild amount of sludge seen within gallbladder lumen. Electronically Signed  By: Marijo Conception, M.D.   On: 05/31/2018 12:42    ELIGIBLE FOR AVAILABLE RESEARCH PROTOCOL: no  ASSESSMENT: 52 y.o. [place] woman admitted 05/30/2018 following a fall, found to  have progressive cytopenias; other problems include fever with cultured negative to date, transaminitis with liver US suggesting a focal lesion but MRCP read as c/w focal fat sparing, history of bipolar disorder with recent admission for suicidal ideation  PLAN: I wrote for multiple labs and a blood film this AM ut apparently the orders did not go through as patient had not yet been admitted at that time. I have re-entered orders and in particular need for a blood film to review--should be available in AM.  Note WBC of 3.5 is WNL for African Americans so WBC only became numerically abnormal 11/20. WBC are functionally abnormal of course as in presence of fever one would anticipate a higher number (if due to bacterial infection or inflammation). A viral illness can lower the WBC but viral w/u per GI so far negative.  She has had a moderate normocytic anemia for some time, dropping significantly after 11/12.Retics are inappropriately low at 32.1 B12 and folate are WNL. Ferritin is >5000. Other anemia labs are pending  Platelets were WNL 3 weeks ago, have dropped significantly since.  Overall the history suggests reactive cytopenias--inflammation, medications--not a primary bone marrow  issue; however when all 3 cell lines are involved and when NRBCs are noted a bone marrow biopsy is indicated and this is scheduled for 06/05/2018. It will be useful to have 3 more days of blood counts by then.  Will follow with you to resolution      Chauncey Cruel, MD   06/02/2018 9:34 PM Medical Oncology and Hematology [facility] [address] [place], [place] [address] Tel. [phone]    Fax. [phone]

## 2018-06-02 NOTE — Evaluation (Signed)
Occupational Therapy Evaluation Patient Details Name: Yvonne [name] MRN: [id number] DOB: 11-Jan-1966 Today's Date: 06/02/2018    History of Present Illness 52 yo female with H/O bipolar DO, presented to ED after a fall, orthostatic hypotension, fever. patient resides in a group home.   Clinical Impression   Pt was admitted for the above. At baseline, she lives in a group home and is mostly independent with adls. Pt is weak and has decreased FMC. Will follow in acute setting with min A level goals.  She currently needs mod +2 for transfers and up to total +2 for LB dressing    Follow Up Recommendations  SNF(unless group home can assist as needed)    Equipment Recommendations  3 in 1 bedside commode    Recommendations for Other Services       Precautions / Restrictions Precautions Precautions: Fall Restrictions Weight Bearing Restrictions: No      Mobility Bed Mobility Overal bed mobility: Needs Assistance Bed Mobility: Rolling;Sidelying to Sit Rolling: Mod assist Sidelying to sit: Mod assist;HOB elevated   Sit to supine: Mod assist   General bed mobility comments: performed by PT  Transfers Overall transfer level: Needs assistance Equipment used: 2 person hand held assist Transfers: Sit to/from Stand Sit to Stand: Mod assist;+2 safety/equipment         General transfer comment: stood from EOB lifting help and assist to step to recliner and back to bed.; HR max 132    Balance     Sitting balance-Leahy Scale: Fair                                     ADL either performed or assessed with clinical judgement   ADL Overall ADL's : Needs assistance/impaired Eating/Feeding: Set up   Grooming: Minimal assistance;Sitting;Oral care   Upper Body Bathing: Minimal assistance   Lower Body Bathing: Moderate assistance;+2 for physical assistance;Sit to/from stand   Upper Body Dressing : Moderate assistance   Lower Body Dressing: Total assistance;+2  for physical assistance;Sit to/from stand   Toilet Transfer: Moderate assistance;+2 for physical assistance;Stand-pivot   Toileting- Clothing Manipulation and Hygiene: Total assistance;+2 for physical assistance;Sit to/from stand         General ADL Comments: decreased coordination when trying to open toothpaste; able to use and manipulate toothbrush     Vision         Perception     Praxis      Pertinent Vitals/Pain Pain Assessment: Faces Faces Pain Scale: Hurts even more Pain Location: arms and legs with AAROM Pain Descriptors / Indicators: Discomfort;Grimacing Pain Intervention(s): Monitored during session;Repositioned;Limited activity within patient's tolerance     Hand Dominance     Extremity/Trunk Assessment Upper Extremity Assessment Upper Extremity Assessment: Generalized weakness(decreased [facility])           Communication Communication Communication: (soft spoken)   Cognition Arousal/Alertness: Awake/alert Behavior During Therapy: Flat affect Overall Cognitive Status: Impaired/Different from baseline Area of Impairment: Attention;Problem solving                 Orientation Level: Disoriented to;Person           Problem Solving: Slow processing;Decreased initiation;Requires verbal cues;Requires tactile cues     General Comments       Exercises     Shoulder Instructions      Home Living Family/patient expects to be discharged to:: (SNF vs Group home) Living Arrangements: Group Home  Prior Functioning/Environment          Comments: per chart, mostly independent with adls        OT Problem List: Decreased strength;Decreased activity tolerance;Impaired balance (sitting and/or standing);Decreased coordination;Decreased cognition      OT Treatment/Interventions: Self-care/ADL training;Therapeutic exercise;DME and/or AE instruction;Therapeutic activities;Cognitive  remediation/compensation;Patient/family education;Balance training    OT Goals(Current goals can be found in the care plan section) Acute Rehab OT Goals Patient Stated Goal: none stated OT Goal Formulation: Patient unable to participate in goal setting Time For Goal Achievement: 06/16/18 Potential to Achieve Goals: Good ADL Goals Pt Will Perform Grooming: with set-up;sitting Pt Will Transfer to Toilet: with min assist;stand pivot transfer;bedside commode Additional ADL Goal #1: pt will perform UB adls with set up and LB adls with min A, sit to stand Additional ADL Goal #2: Pt will perform 10 reps x 3 motions of AROM to increase bil UE strength for adls  OT Frequency: Min 2X/week   Barriers to D/C:            Co-evaluation PT/OT/SLP Co-Evaluation/Treatment: Yes Reason for Co-Treatment: For patient/therapist safety PT goals addressed during session: Mobility/safety with mobility OT goals addressed during session: ADL's and self-care      AM-PAC PT "6 Clicks" Daily Activity     Outcome Measure Help from another person eating meals?: A Little Help from another person taking care of personal grooming?: A Little Help from another person toileting, which includes using toliet, bedpan, or urinal?: A Lot Help from another person bathing (including washing, rinsing, drying)?: A Lot Help from another person to put on and taking off regular upper body clothing?: A Lot Help from another person to put on and taking off regular lower body clothing?: Total 6 Click Score: 13   End of Session    Activity Tolerance: Patient tolerated treatment well Patient left: in bed;with call bell/phone within reach;with bed alarm set  OT Visit Diagnosis: Muscle weakness (generalized) (M62.81)                Time: [id number] OT Time Calculation (min): 30 min Charges:  OT General Charges $OT Visit: 1 Visit OT Evaluation $OT Eval Low Complexity: 1 Low  Yvonne OtterMaryellen Latressa [name], OTR/L Acute Rehabilitation  Services [phone] [facility] pager [phone] office 06/02/2018  Yvonne [name] 06/02/2018, 1:20 PM

## 2018-06-02 NOTE — Progress Notes (Signed)
PROGRESS NOTE    Yvonne [name]  EGB:[id number] DOB: 06/09/1966 DOA: 05/30/2018 PCP: Leonard Downing, MD   Brief Narrative:  HPI per Dr. Gevena Barre on 05/30/18 Yvonne [name] is a 52 y.o. female with medical history significant for bipolar disorder who resides in a group home and presented to the emergency room after having a fall following lightheadedness.  In the emergency room, noted to have significant orthostatic hypotension.White count normal, although patient noted to have some mild transaminitis.  Mildly elevated lipase level although she does not report any abdominal pain.  Initially no evidence of fever.  Chest x-ray unremarkable.  Abdominal CT also unrevealing.  Hospitalist were called for further evaluation.  Following admission to the floor, patient spiked a fever of 101.4, remained tachycardic.  Lactic acid and procalcitonin level ordered.  **LFTs still elevated and patient still spiking temperature so will place on [employer] Abx and give 2 liter NS Boluses. U/S of the Abdomen with diffuse hepatocellular disease such as possible hepatitis with minimal ascites noted around the liver.  There is also 2.3 cm round hypoechoic solid abnormality in the right hepatic lobe and MRI is recommended so have ordered MRCP; patient was noted to be still pancytopenic so I discussed the case with Dr. Alen Blew of oncology who recommends continue to watch.  We will transfuse 1 unit of PRBCs for the patient and I have consulted gastroenterology for further evaluation recommendations given that her ferritin level extremely high and her LFTs are still elevated. MRI of the Abdomen showed fatty lesion identified and that the lesion on CT scan may represent focal fatty sparing.  Ultrasound was recommended to demonstrate stability in 3 to 6 months there is bibasilar atelectasis and effusions noted and no biliary duct dilatation or gallbladder sludge.  Because patient is becoming worsening pancytopenic  I have called hematology for further evaluation recommendations they have recommended obtaining a bone marrow biopsy which she will be scheduled for tentatively 06/05/2018.  Assessment & Plan:   Principal Problem:   Orthostatic hypotension Active Problems:   Bipolar I disorder, current or most recent episode manic, with psychotic features (HCC)   Severe protein-calorie malnutrition (HCC)   Hypocalcemia   Transaminitis   Fever   Thrombocytopenia (HCC)  Sepsis from Unclear Etiology -Patient spiked a temperature (101.8), was tachycardic, and tachypenic, and has a Leukopenia (now 1.9)) -Sepsis physiology Improved somewhat but still tachycardic  -Will hold Benztropine given that she keeps spiking fevers -Check CXR, Urine Cx, Blood Cx x2 -CXR showed low volume film without acute abnornality -Give Boluses 2 Liters and will continue IVF hydration with D5 saline at rate 125 mils per hour -Follow-up cultures; Blood Cx's from 11/20 showed NGTD at 1 Day still -Urine Cx showed 60,000 CFU of Multiple Species and recommended re-collection -Hepatitis Panel Negative and Influenza Negative  -Empirically start antibiotics with IV vancomycin and IV cefepime and will continue  -Check lactic acid level and pro calcitonin level was elevated at slightly 0.59 is trending down to 0.43 -> 0.38 -> 0.33; LA was 2.1 and trended down to 1.7 -Continue to Monitor and Repeat CBC in AM   Hypophosphatemia -Patient's phosphorus level was 2.3 and improved to 2.9 -Continue monitor and replete as necessary -Repeat phosphorus level in a.m.  Abnormal LFTs/Transaminitis -Patient's AST is now 324 -Patient ALT is now 21 -Patient resides in a group home so unclear if she had a alcohol use or not -Placed on CIWA protocol empirically -Obtained right upper quadrant ultrasound showed coarse  echotexture of the hepatic parenchyma suggesting diffuse hepatocellular disease such as possible hepatitis. There is also hypoechoic areas  that were noted around the portal triads suggesting edema potentially related to hepatic information or lymphatic obstruction secondary adenopathy at the porta hepatis region.  There is minimal ascites noted around the liver and there is a 2.3 cm rounded hypoechoic solid abnormality that was seen in the right hepatic lobe which was not seen on prior CT scan of the abdomen and further evaluation the MRI is recommended to evaluate for possible neoplasm.  There is a mild amount of sludge seen within the gallbladder lumen -Obtained hepatitis panel and was Negative -ANA reflex ordered; HIV Was negative -EtOH Level was Negative and Salicylate Level was Negative -UDS Negative  -MRI of the Abdomen did not show any hepatic lesion identified but did show bibasilar atelectasis and effusions; There was no biliary duct dilitation or gall bladder sludge -Discussed the case with Dr. Lucio Edward of gastroenterology and GI to see the patient in formal consultation today  -GI ordering additional workup during adding AMA, ASMA, EBV, CMV antibodies along with ceruloplasmin;  -Ceruloplasmin was low at 17.8 and CMV IgM was <30.0 -INR was normal; -GI does not feel like this a primary liver problem at this point  Orthostatic Hypotension -In the setting of dehydration from poor p.o. intake and likely sepsis -Given 2 L of normal saline boluses and will continue with maintenance IV fluid hydration with D5 normal saline rate of 125 mL's per hour but will cut down to 75 mL/hr -Repeat orthostatic vital signs in a.m showed she did not drop her BP but became extremely tachycardic  -PT OT evaluation recommending skilled nursing facility  Bipolar 1 Disorder -Currently not manic but does have a history of psychotic features -Continue with home medications including aripiprazole 50 mg p.o. daily and escitalopram 5 mg p.o. daily -Hold benztropine as it can cause fever -Continue monitor very carefully  Underweight -Continue  with Ensure Enlive p.o. twice daily supplements -Nutrition is consulted for further evaluation recommendations  Hypocalcemia -Was given 1 amp of calcium gluconate and will repeat today  -Likely in the setting of poor p.o. intake -We will repeat 1 amp of calcium gluconate -Ca2+ is 6.7 this AM   Thrombocytopenia -Unclear etiology but likely in the setting of sepsis -Lab work from a week ago was noted to have platelet counts of 141 and then trended down to 71 is now 43 this morning -Continue SCDs and hold anticoagulation -Continue to monitor for signs and symptoms of bleeding -We will repeat CBC in the a.m.  Normocytic Anemia -Patient's hemoglobin/hematocrit went from 8.6/27.5 -> 7.1/23.4 and is now 9.1/28.1 -Checked anemia panel and showed iron level 33, U IBC of 82, TIBC of 115, saturation ratios of 29%, ferritin level 5643, folate level of 9.3, and vitamin B12 level 270 -Type and screen and transfuse 1 unit of PRBCs -Continue to monitor for signs and symptoms of bleeding -Repeat CBC in the a.m.  Pancytopenia -As above patient's WBC is now 1.9, hemoglobin/hematocrit 9.1/28.1 (after blood), and the count was 43 -Discussed the case with hematology Dr. Alen Blew who recommends continuing to monitor and watch and repeat CBC in the a.m.  Dr. Alen Blew does not feel strongly for a bone marrow biopsy at this time and recommends continuing to observe.  If counts drop further hematology will formally consult -Repeat CBC in a.m.  Hypomagnesemia -Patient magnesium level was 1.6 and improved to 1.9 -Replete with IV mag sulfate 2  g -Continue monitor replete as necessary -Repeat Magnesium Level in AM  Hypokalemia -Patient's K+ this AM was 3.4 -Replete with IV KCl 40 mEQ -Continue to Monitor and Replete as Necessary  -Repeat CMP in AM   DVT prophylaxis: SCDs given thrombocytopenia Code Status: FULL CODE Family Communication: No family present at bedside Disposition Plan: Remain inpatient for  further work-up and evaluation  Consultants:   Gastroenterology  I discussed the case with hematology Dr. Alen Blew and Dr. Vivia Birmingham; Hematology to consult formally today.    Procedures:  RUQ U/S MRCP  Antimicrobials: Anti-infectives (From admission, onward)   Start     Dose/Rate Route Frequency Ordered Stop   06/01/18 1730  vancomycin (VANCOCIN) IVPB 750 mg/150 ml premix     750 mg 150 mL/hr over 60 Minutes Intravenous Every 24 hours 05/31/18 1629     05/31/18 1700  ceFEPIme (MAXIPIME) 1 g in sodium chloride 0.9 % 100 mL IVPB     1 g 200 mL/hr over 30 Minutes Intravenous Every 12 hours 05/31/18 1616     05/31/18 1630  vancomycin (VANCOCIN) IVPB 1000 mg/200 mL premix     1,000 mg 200 mL/hr over 60 Minutes Intravenous  Once 05/31/18 1616 05/31/18 1810     Subjective: Seen and examined at bedside and states that she feels a little bit better but still very weak.  Denies any abdominal pain, chest pain, lightheadedness or dizziness.  Objective: Vitals:   06/01/18 1348 06/01/18 2127 06/02/18 0538 06/02/18 1338  BP: 120/82 120/77 106/77 106/78  Pulse: 93 94 (!) 102 94  Resp: '18 17 18 14  ' Temp: 98.7 F (37.1 C) 99.3 F (37.4 C) 100.2 F (37.9 C) 98.6 F (37 C)  TempSrc: Oral Oral Oral Oral  SpO2: 100% 98% 97% 100%  Weight:      Height:        Intake/Output Summary (Last 24 hours) at 06/02/2018 1410 Last data filed at 06/02/2018 1230 Gross per 24 hour  Intake 2009.5 ml  Output 2325 ml  Net -315.5 ml   Filed Weights   05/30/18 0854  Weight: 46.3 kg   Examination: Physical Exam:  Constitutional: Thin African-American female currently no acute distress appears calm but does look very fatigued and somewhat ill-appearing Eyes: Lids and conjunctive are normal.  Sclera anicteric ENMT: External ears and nose appear normal.  Grossly normal hearing Neck: Appears supple with no JVD Respiratory: Slightly diminished to auscultation bilaterally no appreciable wheezing, rales,  rhonchi.  Patient was not tachypneic or using any accessory muscles to breathe Cardiovascular: Regular rate and rhythm but slightly in the faster side.  No lower extremity edema noted. Abdomen: Soft, non-tender, non-distended.  Bowel sounds present all 4 quadrants GU: Deferred Musculoskeletal: No contractures or cyanosis.  Has very diminished strength Skin: No appreciable rashes or lesions limited skin evaluation Neurologic: Cranial nerves II through XII grossly intact no appreciable focal deficits.  Romberg sign and cerebellar reflexes were not assessed Psychiatric: Normal judgment and insight.  Patient is awake and alert and oriented x3.  Has a slightly flat affect  Data Reviewed: I have personally reviewed following labs and imaging studies  CBC: Recent Labs  Lab 05/30/18 1010 05/31/18 0614 06/01/18 0627 06/02/18 0611  WBC 3.7* 2.5* 2.2* 1.9*  NEUTROABS  --   --  1.3* 0.9*  HGB 8.6* 7.2* 7.1* 9.1*  HCT 27.5* 24.0* 23.4* 28.1*  MCV 87.9 89.9 89.7 87.8  PLT 71* 58* 52* 43*   Basic Metabolic Panel: Recent Labs  Lab 05/30/18 1010 05/31/18 0614 05/31/18 0745 05/31/18 0749 06/01/18 0627 06/02/18 0611  NA 138 139  --   --  139 139  K 4.2 3.5  --   --  3.6 3.4*  CL 106 113*  --   --  113* 113*  CO2 25 22  --   --  21* 23  GLUCOSE 90 105*  --   --  93 88  BUN 15 9  --   --  7 6  CREATININE 0.61 0.52  --   --  0.43* 0.48  CALCIUM 7.7* 7.3*  --   --  7.0* 6.7*  MG  --   --  1.8  --  1.6* 1.9  PHOS  --   --   --  2.1* 2.3* 2.9   GFR: Estimated Creatinine Clearance: 59.4 mL/min (by C-G formula based on SCr of 0.48 mg/dL). Liver Function Tests: Recent Labs  Lab 05/30/18 1010 05/31/18 0614 06/01/18 0627 06/02/18 0611  AST 393* 302* 327* 324*  ALT 62* 49* 49* 47*  ALKPHOS 294* 238* 285* 330*  BILITOT 0.5 0.4 0.4 0.6  PROT 7.1 5.8* 5.6* 5.5*  ALBUMIN 2.3* 1.7* 1.6* 1.5*   Recent Labs  Lab 05/30/18 1010 05/31/18 0614  LIPASE 61* 55*   No results for input(s):  AMMONIA in the last 168 hours. Coagulation Profile: Recent Labs  Lab 06/01/18 1344  INR 0.90   Cardiac Enzymes: No results for input(s): CKTOTAL, CKMB, CKMBINDEX, TROPONINI in the last 168 hours. BNP (last 3 results) No results for input(s): PROBNP in the last 8760 hours. HbA1C: No results for input(s): HGBA1C in the last 72 hours. CBG: No results for input(s): GLUCAP in the last 168 hours. Lipid Profile: No results for input(s): CHOL, HDL, LDLCALC, TRIG, CHOLHDL, LDLDIRECT in the last 72 hours. Thyroid Function Tests: Recent Labs    05/31/18 0614  TSH 0.659   Anemia Panel: Recent Labs    06/01/18 0627 06/01/18 0628  VITAMINB12 270  --   FOLATE  --  9.3  FERRITIN 5,643*  --   TIBC 115*  --   IRON 33  --   RETICCTPCT 1.2  --    Sepsis Labs: Recent Labs  Lab 05/30/18 1835 05/31/18 1553 05/31/18 1610 05/31/18 1817 06/01/18 0627 06/02/18 0611  PROCALCITON 0.59  --  0.43  --  0.38 0.33  LATICACIDVEN 1.0 2.1*  --  1.7  --   --     Recent Results (from the past 240 hour(s))  Culture, blood (routine x 2)     Status: None (Preliminary result)   Collection Time: 05/31/18  7:45 AM  Result Value Ref Range Status   Specimen Description   Final    BLOOD RIGHT ANTECUBITAL Performed at [facility], [place] [address] [place], [place] [address]    Special Requests   Final    BOTTLES DRAWN AEROBIC AND ANAEROBIC Blood Culture adequate volume Performed at [place] [address] [place], [place] [address]    Culture   Final    NO GROWTH 1 DAY Performed at [facility], [place] [address] [place], [place] [address]    Report Status PENDING  Incomplete  Culture, blood (routine x 2)     Status: None (Preliminary result)   Collection Time: 05/31/18  7:49 AM  Result Value Ref Range Status   Specimen Description   Final    BLOOD RIGHT ARM Performed at [facility],  [place] [address] [place], [place] [address]      Special Requests   Final    BOTTLES DRAWN AEROBIC AND ANAEROBIC Blood Culture adequate volume Performed at [place] [address] [place], [place] [address]    Culture   Final    NO GROWTH 1 DAY Performed at [facility], [place] [address] [place], [place] [address]    Report Status PENDING  Incomplete  Culture, Urine     Status: Abnormal   Collection Time: 05/31/18  4:18 PM  Result Value Ref Range Status   Specimen Description   Final    URINE, RANDOM Performed at [place] [address], [place], [place] [address]    Special Requests   Final    NONE Performed at [facility], [place] [address], [place], [place] [address]    Culture (A)  Final    60,000 COLONIES/mL MULTIPLE SPECIES PRESENT, SUGGEST RECOLLECTION   Report Status 06/02/2018 FINAL  Final     Radiology Studies: Mr Abdomen Mrcp Wo Contrast  Result Date: 06/01/2018 CLINICAL DATA:  Elevated liver function tests. Indeterminate lesion identified on ultrasound. MRI recommended. EXAM: MRI ABDOMEN WITHOUT CONTRAST  (INCLUDING MRCP) TECHNIQUE: Multiplanar multisequence MR imaging of the abdomen was performed. Heavily T2-weighted images of the biliary and pancreatic ducts were obtained, and three-dimensional MRCP images were rendered by post processing. COMPARISON:  Ultrasound 05/31/2018, [place] [address] FINDINGS: Patient unable to complete the entire exam due to pain. Lower chest:  Bilateral small effusions and bibasilar atelectasis. Hepatobiliary: No focal hepatic lesion identified. No lesion corresponds to the hypodense lesion on comparison CT. Suspect lesion may represent focal fatty sparing. Opposed phase imaging was not performed due to patient discomfort. There is no intrahepatic or extrahepatic biliary duct dilatation. Pancreas: Normal pancreatic parenchymal intensity. No ductal dilatation or inflammation. Spleen: Spleen is mildly enlarged Adrenals/urinary  tract: Adrenal glands and kidneys are normal. Stomach/Bowel: Stomach and limited of the small bowel is unremarkable Vascular/Lymphatic: Abdominal aortic normal caliber. No retroperitoneal periportal lymphadenopathy. Musculoskeletal: No aggressive osseous lesion IMPRESSION: 1. No hepatic lesion identified. All sequences could not be completed due to patient termination of exam due to pain. Opposed phase imaging not performed or postcontrast imaging. Lesion not identified on the provided sequences. Lesion may represent focal fatty sparing. As lesion is occult on CT and MRI not tolerated in this patient patient (at least currently), recommend follow-up ultrasound to demonstrate stability in 3-6 months. 2. Bibasilar atelectasis and effusions. 3. No biliary duct dilatation gallbladder sludge Electronically Signed   By: Suzy Bouchard M.D.   On: 06/01/2018 15:18   Mr 3d Recon At Scanner  Result Date: 06/01/2018 CLINICAL DATA:  Elevated liver function tests. Indeterminate lesion identified on ultrasound. MRI recommended. EXAM: MRI ABDOMEN WITHOUT CONTRAST  (INCLUDING MRCP) TECHNIQUE: Multiplanar multisequence MR imaging of the abdomen was performed. Heavily T2-weighted images of the biliary and pancreatic ducts were obtained, and three-dimensional MRCP images were rendered by post processing. COMPARISON:  Ultrasound 05/31/2018, [place] [address] FINDINGS: Patient unable to complete the entire exam due to pain. Lower chest:  Bilateral small effusions and bibasilar atelectasis. Hepatobiliary: No focal hepatic lesion identified. No lesion corresponds to the hypodense lesion on comparison CT. Suspect lesion may represent focal fatty sparing. Opposed phase imaging was not performed due to patient discomfort. There is no intrahepatic or extrahepatic biliary duct dilatation. Pancreas: Normal pancreatic parenchymal intensity. No ductal dilatation or inflammation. Spleen: Spleen is mildly enlarged Adrenals/urinary tract:  Adrenal  glands and kidneys are normal. Stomach/Bowel: Stomach and limited of the small bowel is unremarkable Vascular/Lymphatic: Abdominal aortic normal caliber. No retroperitoneal periportal lymphadenopathy. Musculoskeletal: No aggressive osseous lesion IMPRESSION: 1. No hepatic lesion identified. All sequences could not be completed due to patient termination of exam due to pain. Opposed phase imaging not performed or postcontrast imaging. Lesion not identified on the provided sequences. Lesion may represent focal fatty sparing. As lesion is occult on CT and MRI not tolerated in this patient patient (at least currently), recommend follow-up ultrasound to demonstrate stability in 3-6 months. 2. Bibasilar atelectasis and effusions. 3. No biliary duct dilatation gallbladder sludge Electronically Signed   By: Suzy Bouchard M.D.   On: 06/01/2018 15:18   Dg Chest Port 1 View  Result Date: 05/31/2018 CLINICAL DATA:  Fever EXAM: PORTABLE CHEST 1 VIEW COMPARISON:  05/30/2018 chest radiograph FINDINGS: This is a mildly low volume film. The cardiomediastinal silhouette is unremarkable. There is no evidence of focal airspace disease, pulmonary edema, suspicious pulmonary nodule/mass, pleural effusion, or pneumothorax. No acute bony abnormalities are identified. IMPRESSION: Low volume film without acute abnormality. Electronically Signed   By: Margarette Canada M.D.   On: 05/31/2018 15:16   Scheduled Meds: . ARIPiprazole  15 mg Oral Daily  . escitalopram  5 mg Oral Daily  . feeding supplement (ENSURE ENLIVE)  237 mL Oral BID BM  . folic acid  1 mg Oral Daily  . multivitamin with minerals  1 tablet Oral Daily  . thiamine  100 mg Oral Daily   Or  . thiamine  100 mg Intravenous Daily   Continuous Infusions: . calcium gluconate    . ceFEPime (MAXIPIME) IV 1 g (06/02/18 0548)  . dextrose 5 % and 0.9% NaCl 125 mL/hr at 06/02/18 0545  . potassium chloride 10 mEq (06/02/18 1331)  . vancomycin 750 mg (06/01/18 1744)     LOS: 3 days   Kerney Elbe, DO [facility] PAGER is on [place]  If 7PM-7AM, please contact night-coverage [email or web address] Password [email or web address] 06/02/2018, 2:10 PM

## 2018-06-02 NOTE — Progress Notes (Signed)
Pharmacy Antibiotic Note  Yvonne [name] is a 52 y.o. female resident of [facility] with a h/o bipolar disorder admitted on 05/30/2018 with generalized weakness, decreased oral intake, and possible sepsis of unknown source.  Pharmacy has been consulted for vancomycin and cefepime dosing.  06/02/2018 WBC 1.9 Scr 0.48 Febrile to 100.2  Plan: Continue Cefepime 1 g iv q 12h.  Continue Vancomycin  750 mg iv q 24 h. Predicted AUC 431 using IBW/ABW.  - F/U culture results and renal function    Height: 5\' 2"  (157.5 cm) Weight: 102 lb (46.3 kg) IBW/kg (Calculated) : 50.1  Temp (24hrs), Avg:98.9 F (37.2 C), Min:98.1 F (36.7 C), Max:100.2 F (37.9 C)  Recent Labs  Lab 05/30/18 1010 05/30/18 1835 05/31/18 0614 05/31/18 1553 05/31/18 1817 06/01/18 0627 06/02/18 0611  WBC 3.7*  --  2.5*  --   --  2.2* 1.9*  CREATININE 0.61  --  0.52  --   --  0.43* 0.48  LATICACIDVEN  --  1.0  --  2.1* 1.7  --   --     Estimated Creatinine Clearance: 59.4 mL/min (by C-G formula based on SCr of 0.48 mg/dL).    No Known Allergies  Antimicrobials this admission: 11/20 vancomycin >>  11/20 cefepime >>   Dose adjustments this admission:  Microbiology results: 11/20 BCx:  11/20 UCx:    Thank you for allowing pharmacy to be a part of this patient's care.  Arley Phenixllen Malia Corsi RPh 06/02/2018, 11:04 AM Pager [phone]

## 2018-06-02 NOTE — Progress Notes (Addendum)
[facility] Progress Note   Chief Complaint:   Weakness, fever, abnormal liver tests   SUBJECTIVE:    she still feels weak but some improvement in that compared to yesterday. She feels sore all over, especially in her extremities. No abdominal pain. She did eat breakfast today   ASSESSMENT AND PLAN:   52 yo female significant weakness, fevers, progressive pancytopenia and elevated liver tests. AST still elevated in 300 range but stable. Alk phos up from 285 to 330. Bili remains normal. Liver lesion on u/s but not corroborated by MRCP yesterday.  In fact MRCP was unremarkable. Doesn't seem like this is a primary liver problem at this point but some of our labs are still pending. INR is normal.  -Hgb better but got blood yesterday.  -Hospitalist consulting Hematology -CMV, IgM negative. -Viral hepatitis studies negative -Influenza negative -HIV negative -ANA, AMA, ASMA all pending.  -Ceruloplasmin is slightly low at 17.5 and possibly secondary to low protein / malnutrition.     Attending Physician Note   I have taken an interval history, reviewed the chart and examined the patient. I agree with the Advanced Practitioner's note, impression and recommendations. Abd MRI/MRCP was a limited exam however no hepatic abnormalities were noted. Small amount of GB sludge. No biliary dilation. Abd CT also did not show hepatic abnormalities. The lesion on [place] is felt to be focal fatty sparing. Slightly low ceruloplasmin is not likely clinically significant. May need further evaluation pending rest of work up. Several hepatic serologies are pending. Bone marrow biopsy is planned.   Lucio Edward, MD FACG [phone]    OBJECTIVE:     Vital signs in last 24 hours: Temp:  [98.1 F (36.7 C)-100.2 F (37.9 C)] 100.2 F (37.9 C) (11/22 0538) Pulse Rate:  [93-102] 102 (11/22 0538) Resp:  [17-18] 18 (11/22 0538) BP: (95-120)/(59-85) 106/77 (11/22 0538) SpO2:  [97 %-100 %]  97 % (11/22 0538)   General:   Alert, thin female in NAD EENT:  Normal hearing, non icteric sclera, conjunctive pink.  Heart:  Regular rate and rhythm; no murmur.  No lower extremity edema   Pulm: Normal respiratory effort, lungs CTA bilaterally without wheezes or crackles. Abdomen:  Soft, nondistended, nontender.  Normal bowel sounds, no masses felt.       Neurologic:  Alert and  oriented x4;  grossly normal neurologically. Psych:  Cooperative. Very flat affect.   Intake/Output from previous day: 11/21 0701 - 11/22 0700 In: 2344.5 [I.V.:1829.5; Blood:315; IV Piggyback:200] Out: 3025 [Urine:3025] Intake/Output this shift: No intake/output data recorded.  Lab Results: Recent Labs    05/31/18 0614 06/01/18 0627 06/02/18 0611  WBC 2.5* 2.2* 1.9*  HGB 7.2* 7.1* 9.1*  HCT 24.0* 23.4* 28.1*  PLT 58* 52* 43*   BMET Recent Labs    05/31/18 0614 06/01/18 0627 06/02/18 0611  NA 139 139 139  K 3.5 3.6 3.4*  CL 113* 113* 113*  CO2 22 21* 23  GLUCOSE 105* 93 88  BUN _0 CREATININE 0.52 0.43* 0.48  CALCIUM 7.3* 7.0* 6.7*   LFT Recent Labs    06/02/18 0611  PROT 5.5*  ALBUMIN 1.5*  AST 324*  ALT 47*  ALKPHOS 330*  BILITOT 0.6   PT/INR Recent Labs    06/01/18 1344  LABPROT 12.1  INR 0.90   Hepatitis Panel Recent Labs    05/31/18 0749  HEPBSAG Negative  HCVAB 0.4  HEPAIGM Negative  HEPBIGM Negative  Mr Abdomen Mrcp Wo Contrast  Result Date: 06/01/2018 CLINICAL DATA:  Elevated liver function tests. Indeterminate lesion identified on ultrasound. MRI recommended. EXAM: MRI ABDOMEN WITHOUT CONTRAST  (INCLUDING MRCP) TECHNIQUE: Multiplanar multisequence MR imaging of the abdomen was performed. Heavily T2-weighted images of the biliary and pancreatic ducts were obtained, and three-dimensional MRCP images were rendered by post processing. COMPARISON:  Ultrasound 05/31/2018, [place] [address] FINDINGS: Patient unable to complete the entire exam due to pain. Lower chest:   Bilateral small effusions and bibasilar atelectasis. Hepatobiliary: No focal hepatic lesion identified. No lesion corresponds to the hypodense lesion on comparison CT. Suspect lesion may represent focal fatty sparing. Opposed phase imaging was not performed due to patient discomfort. There is no intrahepatic or extrahepatic biliary duct dilatation. Pancreas: Normal pancreatic parenchymal intensity. No ductal dilatation or inflammation. Spleen: Spleen is mildly enlarged Adrenals/urinary tract: Adrenal glands and kidneys are normal. Stomach/Bowel: Stomach and limited of the small bowel is unremarkable Vascular/Lymphatic: Abdominal aortic normal caliber. No retroperitoneal periportal lymphadenopathy. Musculoskeletal: No aggressive osseous lesion IMPRESSION: 1. No hepatic lesion identified. All sequences could not be completed due to patient termination of exam due to pain. Opposed phase imaging not performed or postcontrast imaging. Lesion not identified on the provided sequences. Lesion may represent focal fatty sparing. As lesion is occult on CT and MRI not tolerated in this patient patient (at least currently), recommend follow-up ultrasound to demonstrate stability in 3-6 months. 2. Bibasilar atelectasis and effusions. 3. No biliary duct dilatation gallbladder sludge Electronically Signed   By: Suzy Bouchard M.D.   On: 06/01/2018 15:18   Mr 3d Recon At Scanner  Result Date: 06/01/2018 CLINICAL DATA:  Elevated liver function tests. Indeterminate lesion identified on ultrasound. MRI recommended. EXAM: MRI ABDOMEN WITHOUT CONTRAST  (INCLUDING MRCP) TECHNIQUE: Multiplanar multisequence MR imaging of the abdomen was performed. Heavily T2-weighted images of the biliary and pancreatic ducts were obtained, and three-dimensional MRCP images were rendered by post processing. COMPARISON:  Ultrasound 05/31/2018, [place] [address] FINDINGS: Patient unable to complete the entire exam due to pain. Lower chest:  Bilateral small  effusions and bibasilar atelectasis. Hepatobiliary: No focal hepatic lesion identified. No lesion corresponds to the hypodense lesion on comparison CT. Suspect lesion may represent focal fatty sparing. Opposed phase imaging was not performed due to patient discomfort. There is no intrahepatic or extrahepatic biliary duct dilatation. Pancreas: Normal pancreatic parenchymal intensity. No ductal dilatation or inflammation. Spleen: Spleen is mildly enlarged Adrenals/urinary tract: Adrenal glands and kidneys are normal. Stomach/Bowel: Stomach and limited of the small bowel is unremarkable Vascular/Lymphatic: Abdominal aortic normal caliber. No retroperitoneal periportal lymphadenopathy. Musculoskeletal: No aggressive osseous lesion IMPRESSION: 1. No hepatic lesion identified. All sequences could not be completed due to patient termination of exam due to pain. Opposed phase imaging not performed or postcontrast imaging. Lesion not identified on the provided sequences. Lesion may represent focal fatty sparing. As lesion is occult on CT and MRI not tolerated in this patient patient (at least currently), recommend follow-up ultrasound to demonstrate stability in 3-6 months. 2. Bibasilar atelectasis and effusions. 3. No biliary duct dilatation gallbladder sludge Electronically Signed   By: Suzy Bouchard M.D.   On: 06/01/2018 15:18   Dg Chest Port 1 View  Result Date: 05/31/2018 CLINICAL DATA:  Fever EXAM: PORTABLE CHEST 1 VIEW COMPARISON:  05/30/2018 chest radiograph FINDINGS: This is a mildly low volume film. The cardiomediastinal silhouette is unremarkable. There is no evidence of focal airspace disease, pulmonary edema, suspicious pulmonary nodule/mass, pleural effusion, or pneumothorax. No  acute bony abnormalities are identified. IMPRESSION: Low volume film without acute abnormality. Electronically Signed   By: Margarette Canada M.D.   On: 05/31/2018 15:16   US Abdomen Limited Ruq  Result Date: 05/31/2018 CLINICAL  DATA:  Abnormal liver function tests. EXAM: ULTRASOUND ABDOMEN LIMITED RIGHT UPPER QUADRANT COMPARISON:  CT scan of May 30, 2018. FINDINGS: Gallbladder: No gallstones or wall thickening visualized. No sonographic Murphy sign noted by sonographer. Mild amount of sludge is seen within gallbladder lumen. Common bile duct: Diameter: 3 mm which is within normal limits. Liver: 2.3 cm hypoechoic solid abnormality is seen in the right hepatic lobe which is not visualized on prior CT scan. Coarse echotexture of hepatic parenchyma is noted suggesting diffuse hepatocellular disease. Hypoechoic areas are noted around the portal triads suggesting edema potentially related to hepatic inflammation or adenopathy at porta hepatis region. Minimal ascites is noted around the liver. Portal vein is patent on color Doppler imaging with normal direction of blood flow towards the liver. IMPRESSION: Coarse echotexture of hepatic parenchyma is noted suggesting diffuse hepatocellular disease such as possible hepatitis. Hypoechoic areas are noted around the portal triads suggesting edema potentially related to hepatic inflammation, or lymphatic obstruction secondary to adenopathy at the porta hepatis region. Minimal ascites is noted around the liver. 2.3 cm rounded hypoechoic solid abnormality is seen in the right hepatic lobe which is not seen on prior CT scan. Further evaluation with MRI is recommended when patient can hold still to evaluate for possible neoplasm. Mild amount of sludge seen within gallbladder lumen. Electronically Signed   By: Marijo Conception, M.D.   On: 05/31/2018 12:42    Principal Problem:   Orthostatic hypotension Active Problems:   Bipolar I disorder, current or most recent episode manic, with psychotic features ([place])   Severe protein-calorie malnutrition ([place])   Hypocalcemia   Transaminitis   Fever   Thrombocytopenia ([place])     LOS: 3 days   Tye Savoy ,NP 06/02/2018, 11:02 AM

## 2018-06-03 LAB — COMPREHENSIVE METABOLIC PANEL WITH GFR
ALT: 50 U/L — ABNORMAL HIGH (ref 0–44)
AST: 336 U/L — ABNORMAL HIGH (ref 15–41)
Albumin: 1.6 g/dL — ABNORMAL LOW (ref 3.5–5.0)
Alkaline Phosphatase: 378 U/L — ABNORMAL HIGH (ref 38–126)
Anion gap: 4 — ABNORMAL LOW (ref 5–15)
BUN: 6 mg/dL (ref 6–20)
CO2: 23 mmol/L (ref 22–32)
Calcium: 7.1 mg/dL — ABNORMAL LOW (ref 8.9–10.3)
Chloride: 110 mmol/L (ref 98–111)
Creatinine, Ser: 0.41 mg/dL — ABNORMAL LOW (ref 0.44–1.00)
GFR calc Af Amer: >60 mL/min
GFR calc non Af Amer: >60 mL/min
Glucose, Bld: 89 mg/dL (ref 70–99)
Potassium: 4 mmol/L (ref 3.5–5.1)
Sodium: 137 mmol/L (ref 135–145)
Total Bilirubin: 0.8 mg/dL (ref 0.3–1.2)
Total Protein: 5.7 g/dL — ABNORMAL LOW (ref 6.5–8.1)

## 2018-06-03 LAB — CBC WITH DIFFERENTIAL/PLATELET
Abs Immature Granulocytes: 0.17 10*3/uL — ABNORMAL HIGH (ref 0.00–0.07)
BASOS ABS: 0 10*3/uL (ref 0.0–0.1)
Basophils Relative: 1 %
EOS PCT: 1 %
Eosinophils Absolute: 0 10*3/uL (ref 0.0–0.5)
HCT: 29.1 % — ABNORMAL LOW (ref 36.0–46.0)
Hemoglobin: 9.4 g/dL — ABNORMAL LOW (ref 12.0–15.0)
IMMATURE GRANULOCYTES: 9 %
LYMPHS ABS: 0.7 10*3/uL (ref 0.7–4.0)
Lymphocytes Relative: 37 %
MCH: 28.9 pg (ref 26.0–34.0)
MCHC: 32.3 g/dL (ref 30.0–36.0)
MCV: 89.5 fL (ref 80.0–100.0)
Monocytes Absolute: 0.2 10*3/uL (ref 0.1–1.0)
Monocytes Relative: 11 %
NRBC: 2.7 % — AB (ref 0.0–0.2)
Neutro Abs: 0.8 10*3/uL — ABNORMAL LOW (ref 1.7–7.7)
Neutrophils Relative %: 41 %
Platelets: 49 10*3/uL — ABNORMAL LOW (ref 150–400)
RBC: 3.25 MIL/uL — ABNORMAL LOW (ref 3.87–5.11)
RDW: 15.6 % — ABNORMAL HIGH (ref 11.5–15.5)
WBC: 1.9 10*3/uL — ABNORMAL LOW (ref 4.0–10.5)

## 2018-06-03 LAB — C-REACTIVE PROTEIN: CRP: 1.1 mg/dL — ABNORMAL HIGH

## 2018-06-03 LAB — DIRECT ANTIGLOBULIN TEST (NOT AT ARMC)
DAT, IgG: POSITIVE
DAT, complement: NEGATIVE

## 2018-06-03 LAB — LACTATE DEHYDROGENASE: LDH: 1063 U/L — AB (ref 98–192)

## 2018-06-03 LAB — PHOSPHORUS: Phosphorus: 2.2 mg/dL — ABNORMAL LOW (ref 2.5–4.6)

## 2018-06-03 LAB — SEDIMENTATION RATE: Sed Rate: 65 mm/h — ABNORMAL HIGH (ref 0–22)

## 2018-06-03 LAB — MAGNESIUM: Magnesium: 1.9 mg/dL (ref 1.7–2.4)

## 2018-06-03 MED ORDER — PREDNISONE 50 MG PO TABS
50.0000 mg | ORAL_TABLET | Freq: Every day | ORAL | Status: DC
  Administered 2018-06-04 – 2018-06-13 (×9): 50 mg via ORAL
  Filled 2018-06-03 (×9): qty 1

## 2018-06-03 MED ORDER — SODIUM CHLORIDE 0.9 % IV BOLUS
1000.0000 mL | Freq: Once | INTRAVENOUS | Status: AC
  Administered 2018-06-03: 1000 mL via INTRAVENOUS

## 2018-06-03 MED ORDER — METOPROLOL TARTRATE 25 MG PO TABS
25.0000 mg | ORAL_TABLET | Freq: Two times a day (BID) | ORAL | Status: DC
  Administered 2018-06-03 – 2018-06-19 (×25): 25 mg via ORAL
  Filled 2018-06-03 (×32): qty 1

## 2018-06-03 MED ORDER — HYDROXYCHLOROQUINE SULFATE 200 MG PO TABS
200.0000 mg | ORAL_TABLET | Freq: Two times a day (BID) | ORAL | Status: DC
  Administered 2018-06-03 – 2018-06-13 (×19): 200 mg via ORAL
  Filled 2018-06-03 (×23): qty 1

## 2018-06-03 MED ORDER — PREDNISONE 5 MG PO TABS
5.0000 mg | ORAL_TABLET | Freq: Every day | ORAL | Status: DC
  Administered 2018-06-03: 5 mg via ORAL
  Filled 2018-06-03: qty 1

## 2018-06-03 NOTE — Progress Notes (Signed)
PROGRESS NOTE    [name]  PYP:[id number] DOB: [birth year] DOA: 05/30/2018 PCP: Leonard Downing, MD   Brief Narrative:  HPI per Dr. Gevena Barre on 05/30/18 Yvonne [name] is a 52 y.o. female with medical history significant for bipolar disorder who resides in a group home and presented to the emergency room after having a fall following lightheadedness.  In the emergency room, noted to have significant orthostatic hypotension.White count normal, although patient noted to have some mild transaminitis.  Mildly elevated lipase level although she does not report any abdominal pain.  Initially no evidence of fever.  Chest x-ray unremarkable.  Abdominal CT also unrevealing.  Hospitalist were called for further evaluation.  Following admission to the floor, patient spiked a fever of 101.4, remained tachycardic.  Lactic acid and procalcitonin level ordered.  **LFTs still elevated and patient still spiking temperature so will place on [employer] Abx and give 2 liter NS Boluses. U/S of the Abdomen with diffuse hepatocellular disease such as possible hepatitis with minimal ascites noted around the liver.  There is also 2.3 cm round hypoechoic solid abnormality in the right hepatic lobe and MRI is recommended so have ordered MRCP; patient was noted to be still pancytopenic so I discussed the case with Dr. Alen Blew of oncology who recommends continue to watch.  We will transfuse 1 unit of PRBCs for the patient and I have consulted gastroenterology for further evaluation recommendations given that her ferritin level extremely high and her LFTs are still elevated. MRI of the Abdomen showed fatty lesion identified and that the lesion on CT scan may represent focal fatty sparing.  Ultrasound was recommended to demonstrate stability in 3 to 6 months there is bibasilar atelectasis and effusions noted and no biliary duct dilatation or gallbladder sludge.  Because patient is becoming worsening pancytopenic  I have called hematology for further evaluation recommendations they have recommended obtaining a bone marrow biopsy which she will be scheduled for tentatively 06/05/2018.  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  Patient was started on Plaquenil 200 mg twice daily and also steroids with prednisone 50 mg daily.  I called to discuss the case briefly with rheumatology who recommends outpatient follow-up once patient stabilized.  Assessment & Plan:   Principal Problem:   Orthostatic hypotension Active Problems:   Bipolar I disorder, current or most recent episode manic, with psychotic features (HCC)   Severe protein-calorie malnutrition (HCC)   Hypocalcemia   Transaminitis   Fever   Thrombocytopenia (HCC)  Sepsis from Unclear Etiology -Patient spiked a temperature (101.8), was tachycardic, and tachypenic, and has a Leukopenia (now 1.9)) -Sepsis physiology Improved somewhat but still tachycardic and have placed the patient on a low-dose beta-blocker with metoprolol 25 mg twice daily and given additional bolus -Will hold Benztropine given that she keeps spiking fevers -Check CXR, Urine Cx, Blood Cx x2 -CXR showed low volume film without acute abnornality -Give Boluses 2 Liters and will continue IVF hydration with D5 saline at rate 125 mils per hour -Follow-up cultures; Blood Cx's from 11/20 showed NGTD at 3 days -Urine Cx showed 60,000 CFU of Multiple Species and recommended re-collection -Hepatitis Panel Negative and Influenza Negative; CMV and EBV titers negative -Empirically start antibiotics with IV vancomycin and IV cefepime and will continue for now but will have a low threshold to de-escalate -Check lactic acid level and pro calcitonin level was elevated at slightly 0.59 is trending down  to 0.43 -> 0.38 -> 0.33; LA was 2.1 and trended down to 1.7 -Continue to Monitor and Repeat CBC in  AM   Hypophosphatemia -Patient's phosphorus level was 2.2 -Replete -Continue monitor and replete as necessary -Repeat phosphorus level in a.m.  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 There is also hypoechoic areas that were noted around the portal triads suggesting edema potentially related to hepatic information or lymphatic obstruction secondary adenopathy at the porta hepatis region.  There is minimal ascites noted around the liver and there is a 2.3 cm rounded hypoechoic solid abnormality that was seen in the right hepatic lobe which was not seen on prior CT scan of the abdomen and further evaluation the MRI is recommended to evaluate for possible neoplasm.  There is a mild amount of sludge seen within the gallbladder lumen -Obtained hepatitis panel and was Negative -ANA reflex ordered; HIV Was negative -EtOH Level was Negative and Salicylate Level was Negative -UDS Negative  -MRI of the Abdomen did not show any hepatic lesion identified but did show bibasilar atelectasis and effusions; There was no biliary duct dilitation or gall bladder sludge -Discussed the case with Dr. Lucio Edward of gastroenterology and GI to see the patient in formal consultation today  -GI ordering additional workup during adding AMA, ASMA, EBV, CMV antibodies along with ceruloplasmin;  -Ceruloplasmin was low at 17.8 and CMV IgM was <30.0 and the EBV was less than 36. -INR was normal; -F-actin IgG was 37 and moderate and concerning for autoimmune hepatitis  Orthostatic Hypotension -In the setting of dehydration from poor p.o. intake and likely  sepsis -Given 2 L of normal saline boluses and will continue with maintenance IV fluid hydration with D5 normal saline rate of 125 mL's per hour but will cut down to 75 mL/hr -Repeat orthostatic vital signs in a.m showed she did not drop her BP but became extremely tachycardic  -PT OT evaluation recommending skilled nursing facility -Patient on prednisone 50 mg p.o. daily  Bipolar 1 Disorder -Currently not manic but does have a history of psychotic features -Continue with home medications including aripiprazole 50 mg p.o. daily and escitalopram 5 mg p.o. daily -Hold benztropine as it can cause fever -Continue monitor very carefully  Underweight -Continue with Ensure Enlive p.o. twice daily supplements -Nutrition is consulted for further evaluation recommendations  Hypocalcemia -Was given 1 amp of calcium gluconate and will repeat today  -Likely in the setting of poor p.o. intake -We will repeat 1 amp of calcium gluconate -Ca2+ is 7.1 this a.m.  Thrombocytopenia -Unclear etiology but likely in the setting of sepsis -Lab work from a week ago was noted to have platelet counts of 141 and then trended down to 71 to 43 and is now 49 -Continue SCDs and hold anticoagulation -Continue to monitor for signs and symptoms of bleeding -We will repeat CBC in the a.m.  Normocytic Anemia -Patient's hemoglobin/hematocrit went from 8.6/27.5 -> 7.1/23.4 and is now 9.4/29.1 -Checked anemia panel and showed iron level 33, U IBC of 82, TIBC of 115, saturation ratios of 29%, ferritin level 5643, folate level of 9.3, and vitamin B12 level 270 -Type and screen and transfuse 1 unit of PRBCs -  Continue to monitor for signs and symptoms of bleeding -Repeat CBC in the a.m.  Pancytopenia -As above patient's WBC is now 1.9, hemoglobin/hematocrit 9.4/29.1 (after blood), and the count was 49 -Discussed the case with hematology Dr. Alen Blew who recommends continuing to monitor and watch and repeat CBC in the a.m.   Dr. Alen Blew does not feel strongly for a bone marrow biopsy at this time and recommends continuing to observe.  If counts drop further hematology will formally consult -Repeat CBC in a.m.  Hypomagnesemia -Patient magnesium level was 1.6 and improved to 1.9 -Continue monitor replete as necessary -Repeat Magnesium Level in AM  Hypokalemia -Patient's K+ now 4.0 -Continue to Monitor and Replete as Necessary  -Repeat CMP in AM   Suspected autoimmune diseases including lupus, Sjogren's syndrome, autoimmune hepatitis -LDH was 1063, CRP is 1.1, and ESR 65 -ANA was positive and DS DNA antibody was 135, ENA SM antibody was greater than 8.0, chromatin antibody was greater than 8.0, ribonucleic protein was 7.6, SSA antibody IgG was greater than 8.0, SSB IgG antibody was greater than 8.0 -Given this is concerning the patient has autoimmune disorders including autoimmune lupus, autoimmune sturgeon syndrome and autoimmune hepatitis -F-actin IgG was 37 -We will obtain complement levels -Start the patient on hydroxychloroquine 20 mg p.o. twice daily -Also started patient on prednisone 1 mg/kg 50 mg daily for now -Need to discuss further with rheumatology and have the patient evaluated in outpatient setting once stable for charge  DVT prophylaxis: SCDs given thrombocytopenia Code Status: FULL CODE Family Communication: No family present at bedside Disposition Plan: Remain inpatient for further work-up and evaluation  Consultants:   Gastroenterology  I discussed the case with hematology Dr. Alen Blew and Dr. Vivia Birmingham; Hematology to consult formally today.   I discussed the case with rheumatology Dr. Lenetta Quaker [name]   Procedures:  RUQ U/S MRCP  Antimicrobials: Anti-infectives (From admission, onward)   Start     Dose/Rate Route Frequency Ordered Stop   06/03/18 1000  hydroxychloroquine (PLAQUENIL) tablet 200 mg     200 mg Oral 2 times daily 06/03/18 0812     06/01/18 1730  vancomycin (VANCOCIN)  IVPB 750 mg/150 ml premix     750 mg 150 mL/hr over 60 Minutes Intravenous Every 24 hours 05/31/18 1629     05/31/18 1700  ceFEPIme (MAXIPIME) 1 g in sodium chloride 0.9 % 100 mL IVPB     1 g 200 mL/hr over 30 Minutes Intravenous Every 12 hours 05/31/18 1616     05/31/18 1630  vancomycin (VANCOCIN) IVPB 1000 mg/200 mL premix     1,000 mg 200 mL/hr over 60 Minutes Intravenous  Once 05/31/18 1616 05/31/18 1810     Subjective: Seen and examined at bedside and states that she still feels weak but feeling a little bit better.  No chest pain, lightheadedness or dizziness.  Nurse stated that she became very tachycardic today and was given a bolus and started on low-dose metoprolol.  Objective: Vitals:   06/02/18 1338 06/02/18 2154 06/03/18 0553 06/03/18 1427  BP: 106/78 112/80 103/71 114/75  Pulse: 94 (!) 102 (!) 109 (!) 116  Resp: _0 Temp: 98.6 F (37 C) 98.9 F (37.2 C) 99.5 F (37.5 C) 99.3 F (37.4 C)  TempSrc: Oral Oral Oral Oral  SpO2: 100% 98% 98% 99%  Weight:      Height:        Intake/Output Summary (Last 24 hours) at 06/03/2018 1849 Last data filed at 06/03/2018 1800  Gross per 24 hour  Intake 3362.37 ml  Output 2200 ml  Net 1162.37 ml   Filed Weights   05/30/18 0854  Weight: 46.3 kg   Examination: Physical Exam:  Constitutional: Thin African-American female currently no acute distress appears calm but looks fatigued and still does appear somewhat ill appearing Eyes: Lids and conjunctive are normal.  Sclera anicteric ENMT: External ears and nose appear normal.  Grossly normal hearing Neck: Appears supple no JVD Respiratory: Diminished to auscultation bilaterally no appreciable wheezing, rales, rhonchi.  Patient not tachypneic or using any accessory muscles to breathe  Cardiovascular: Tachycardic rate and rhythm.  No lower extremity edema noted Abdomen: Soft, nontender, nondistended.  Bowel sounds present all 4 quadrants GU: Deferred Musculoskeletal: No  contractures or cyanosis.  Has diminished strength Skin: Skin is warm and dry with no appreciable rashes or lesions limited skin evaluation Neurologic: Cranial nerves II through XII grossly intact no appreciable focal deficits Psychiatric: Normal judgment and insight.  Patient is awake alert and oriented x3.  Has a depressed appearing mood and flat affect  Data Reviewed: I have personally reviewed following labs and imaging studies  CBC: Recent Labs  Lab 05/30/18 1010 05/31/18 0614 06/01/18 0627 06/02/18 0611 06/03/18 0525  WBC 3.7* 2.5* 2.2* 1.9* 1.9*  NEUTROABS  --   --  1.3* 0.9* 0.8*  HGB 8.6* 7.2* 7.1* 9.1* 9.4*  HCT 27.5* 24.0* 23.4* 28.1* 29.1*  MCV 87.9 89.9 89.7 87.8 89.5  PLT 71* 58* 52* 43* 49*   Basic Metabolic Panel: Recent Labs  Lab 05/30/18 1010 05/31/18 0614 05/31/18 0745 05/31/18 0749 06/01/18 0627 06/02/18 0611 06/03/18 0525  NA 138 139  --   --  139 139 137  K 4.2 3.5  --   --  3.6 3.4* 4.0  CL 106 113*  --   --  113* 113* 110  CO2 25 22  --   --  21* 23 23  GLUCOSE 90 105*  --   --  93 88 89  BUN 15 9  --   --  _0 CREATININE 0.61 0.52  --   --  0.43* 0.48 0.41*  CALCIUM 7.7* 7.3*  --   --  7.0* 6.7* 7.1*  MG  --   --  1.8  --  1.6* 1.9 1.9  PHOS  --   --   --  2.1* 2.3* 2.9 2.2*   GFR: Estimated Creatinine Clearance: 59.4 mL/min (A) (by C-G formula based on SCr of 0.41 mg/dL (L)). Liver Function Tests: Recent Labs  Lab 05/30/18 1010 05/31/18 0614 06/01/18 0627 06/02/18 0611 06/03/18 0525  AST 393* 302* 327* 324* 336*  ALT 62* 49* 49* 47* 50*  ALKPHOS 294* 238* 285* 330* 378*  BILITOT 0.5 0.4 0.4 0.6 0.8  PROT 7.1 5.8* 5.6* 5.5* 5.7*  ALBUMIN 2.3* 1.7* 1.6* 1.5* 1.6*   Recent Labs  Lab 05/30/18 1010 05/31/18 0614  LIPASE 61* 55*   No results for input(s): AMMONIA in the last 168 hours. Coagulation Profile: Recent Labs  Lab 06/01/18 1344  INR 0.90   Cardiac Enzymes: No results for input(s): CKTOTAL, CKMB, CKMBINDEX,  TROPONINI in the last 168 hours. BNP (last 3 results) No results for input(s): PROBNP in the last 8760 hours. HbA1C: No results for input(s): HGBA1C in the last 72 hours. CBG: No results for input(s): GLUCAP in the last 168 hours. Lipid Profile: No results for input(s): CHOL, HDL, LDLCALC, TRIG, CHOLHDL, LDLDIRECT in the last 72  hours. Thyroid Function Tests: No results for input(s): TSH, T4TOTAL, FREET4, T3FREE, THYROIDAB in the last 72 hours. Anemia Panel: Recent Labs    06/01/18 0627 06/01/18 0628  VITAMINB12 270  --   FOLATE  --  9.3  FERRITIN 5,643*  --   TIBC 115*  --   IRON 33  --   RETICCTPCT 1.2  --    Sepsis Labs: Recent Labs  Lab 05/30/18 1835 05/31/18 1553 05/31/18 1610 05/31/18 1817 06/01/18 0627 06/02/18 0611  PROCALCITON 0.59  --  0.43  --  0.38 0.33  LATICACIDVEN 1.0 2.1*  --  1.7  --   --     Recent Results (from the past 240 hour(s))  Culture, blood (routine x 2)     Status: None (Preliminary result)   Collection Time: 05/31/18  7:45 AM  Result Value Ref Range Status   Specimen Description   Final    BLOOD RIGHT ANTECUBITAL Performed at [facility], [place] [address] [place], [place] [address]    Special Requests   Final    BOTTLES DRAWN AEROBIC AND ANAEROBIC Blood Culture adequate volume Performed at [facility] [address] [place], [place] [address]    Culture   Final    NO GROWTH 3 DAYS Performed at [facility], [place] [address], [place], [place] [address]    Report Status PENDING  Incomplete  Culture, blood (routine x 2)     Status: None (Preliminary result)   Collection Time: 05/31/18  7:49 AM  Result Value Ref Range Status   Specimen Description   Final    BLOOD RIGHT ARM Performed at [facility] [address], [place], [place] [address]    Special Requests   Final    BOTTLES DRAWN AEROBIC AND ANAEROBIC Blood Culture adequate volume Performed at [facility] [address] [place], [place] [address]    Culture   Final    NO GROWTH 3 DAYS Performed at [facility], [place] [address] [place], [place] [address]    Report Status PENDING  Incomplete  Culture, Urine     Status: Abnormal   Collection Time: 05/31/18  4:18 PM  Result Value Ref Range Status   Specimen Description   Final    URINE, RANDOM Performed at [facility] [address], [place], [place] [address]    Special Requests   Final    NONE Performed at [facility], [place] [address] [place],  [address]    Culture (A)  Final    60,000 COLONIES/mL MULTIPLE SPECIES PRESENT, SUGGEST RECOLLECTION   Report Status 06/02/2018 FINAL  Final     Radiology Studies: No results found. Scheduled Meds: . ARIPiprazole  15 mg Oral Daily  . escitalopram  5 mg Oral Daily  . feeding supplement (ENSURE ENLIVE)  237 mL Oral BID BM  . folic acid  1 mg Oral Daily  . hydroxychloroquine  200 mg Oral BID  . metoprolol tartrate  25 mg Oral BID  . multivitamin with minerals  1 tablet Oral Daily  . [START ON 06/04/2018] predniSONE  50 mg Oral Q breakfast  . thiamine  100 mg Oral Daily   Or  . thiamine  100 mg Intravenous Daily   Continuous Infusions: . ceFEPime (MAXIPIME) IV 1 g (06/03/18 1729)  . dextrose 5 % and 0.9% NaCl 50 mL/hr at 06/03/18 1337  . vancomycin 750 mg (06/03/18  1736)    LOS: 4 days   Kerney Elbe, DO [facility] PAGER is on AMION  If 7PM-7AM, please contact night-coverage [email or web address] Password [facility] 06/03/2018, 6:49 PM

## 2018-06-03 NOTE — Progress Notes (Addendum)
[facility] Progress Note   Chief Complaint:  Weakness, fever, abnormal liver tests     SUBJECTIVE:    Still feels weak. She is eating. No specific pain   ASSESSMENT AND PLAN:   52 yo female with significant weakness, fevers, progressive pancytopenia and elevated liver tests. AST still elevated in 330 range but stable. ALT still with only minimal elevation. Alk phos still rising slowly, up to 378. Bili remains normal. INR normal. Liver lesion on u/s but not corroborated by MRCP . In fact MRCP was unremarkable.  -INR is normal. -Viral hepatitis workup negative -AMA negative -There is concern for lupus. Started on Plaquenil. ASMA is moderately to strongly positive at 37. Will check antiMicrosomal AB liver-kidney. She could have overlapping autoimmune hepatitis. Not starting antibiotics empirically given the recent fevers. She may ultimately need liver biopsy    Attending Physician Note   I have taken an interval history, reviewed the chart and examined the patient. I agree with the Advanced Practitioner's note, impression and recommendations. Elevated transaminases and alk phos. ANA and ASMA positive. Possible autoimmune hepatitis. Check anti LK Ab. Concern for SLE and started on Plaquenil. Probably will need liver biopsy to establish the diagnosis.   Lucio Edward, MD FACG [phone]    OBJECTIVE:     Vital signs in last 24 hours: Temp:  [98.6 F (37 C)-99.5 F (37.5 C)] 99.5 F (37.5 C) (11/23 0553) Pulse Rate:  [94-109] 109 (11/23 0553) Resp:  [14-16] 16 (11/23 0553) BP: (103-112)/(71-80) 103/71 (11/23 0553) SpO2:  [98 %-100 %] 98 % (11/23 0553)   General:   Alert, thin black female in NAD EENT:  Normal hearing, non icteric sclera, conjunctive pink.  Heart:  Regular rate and rhythm.  No lower extremity edema   Pulm: Normal respiratory effort Abdomen:  Soft, nondistended, nontender.  Normal bowel sounds, no masses felt.       Neurologic:  Alert  and  oriented x4;  grossly normal neurologically. Psych: cooperative.    Intake/Output from previous day: 11/22 0701 - 11/23 0700 In: 4291.5 [P.O.:30; I.V.:3380.4; IV Piggyback:881.2] Out: 2000 [Urine:2000] Intake/Output this shift: No intake/output data recorded.  Lab Results: Recent Labs    06/01/18 0627 06/02/18 0611 06/03/18 0525  WBC 2.2* 1.9* 1.9*  HGB 7.1* 9.1* 9.4*  HCT 23.4* 28.1* 29.1*  PLT 52* 43* 49*   BMET Recent Labs    06/01/18 0627 06/02/18 0611 06/03/18 0525  NA 139 139 137  K 3.6 3.4* 4.0  CL 113* 113* 110  CO2 21* 23 23  GLUCOSE 93 88 89  BUN '7 6 6  ' CREATININE 0.43* 0.48 0.41*  CALCIUM 7.0* 6.7* 7.1*   LFT Recent Labs    06/03/18 0525  PROT 5.7*  ALBUMIN 1.6*  AST 336*  ALT 50*  ALKPHOS 378*  BILITOT 0.8   PT/INR Recent Labs    06/01/18 1344  LABPROT 12.1  INR 0.90   Hepatitis Panel No results for input(s): HEPBSAG, HCVAB, HEPAIGM, HEPBIGM in the last 72 hours.  Mr Abdomen Mrcp Wo Contrast  Result Date: 06/01/2018 CLINICAL DATA:  Elevated liver function tests. Indeterminate lesion identified on ultrasound. MRI recommended. EXAM: MRI ABDOMEN WITHOUT CONTRAST  (INCLUDING MRCP) TECHNIQUE: Multiplanar multisequence MR imaging of the abdomen was performed. Heavily T2-weighted images of the biliary and pancreatic ducts were obtained, and three-dimensional MRCP images were rendered by post processing. COMPARISON:  Ultrasound 05/31/2018, [place] [address] FINDINGS: Patient unable to complete the entire exam due to pain.  Lower chest:  Bilateral small effusions and bibasilar atelectasis. Hepatobiliary: No focal hepatic lesion identified. No lesion corresponds to the hypodense lesion on comparison CT. Suspect lesion may represent focal fatty sparing. Opposed phase imaging was not performed due to patient discomfort. There is no intrahepatic or extrahepatic biliary duct dilatation. Pancreas: Normal pancreatic parenchymal intensity. No ductal dilatation or  inflammation. Spleen: Spleen is mildly enlarged Adrenals/urinary tract: Adrenal glands and kidneys are normal. Stomach/Bowel: Stomach and limited of the small bowel is unremarkable Vascular/Lymphatic: Abdominal aortic normal caliber. No retroperitoneal periportal lymphadenopathy. Musculoskeletal: No aggressive osseous lesion IMPRESSION: 1. No hepatic lesion identified. All sequences could not be completed due to patient termination of exam due to pain. Opposed phase imaging not performed or postcontrast imaging. Lesion not identified on the provided sequences. Lesion may represent focal fatty sparing. As lesion is occult on CT and MRI not tolerated in this patient patient (at least currently), recommend follow-up ultrasound to demonstrate stability in 3-6 months. 2. Bibasilar atelectasis and effusions. 3. No biliary duct dilatation gallbladder sludge Electronically Signed   By: Suzy Bouchard M.D.   On: 06/01/2018 15:18   Mr 3d Recon At Scanner  Result Date: 06/01/2018 CLINICAL DATA:  Elevated liver function tests. Indeterminate lesion identified on ultrasound. MRI recommended. EXAM: MRI ABDOMEN WITHOUT CONTRAST  (INCLUDING MRCP) TECHNIQUE: Multiplanar multisequence MR imaging of the abdomen was performed. Heavily T2-weighted images of the biliary and pancreatic ducts were obtained, and three-dimensional MRCP images were rendered by post processing. COMPARISON:  Ultrasound 05/31/2018, [place] [address] FINDINGS: Patient unable to complete the entire exam due to pain. Lower chest:  Bilateral small effusions and bibasilar atelectasis. Hepatobiliary: No focal hepatic lesion identified. No lesion corresponds to the hypodense lesion on comparison CT. Suspect lesion may represent focal fatty sparing. Opposed phase imaging was not performed due to patient discomfort. There is no intrahepatic or extrahepatic biliary duct dilatation. Pancreas: Normal pancreatic parenchymal intensity. No ductal dilatation or inflammation.  Spleen: Spleen is mildly enlarged Adrenals/urinary tract: Adrenal glands and kidneys are normal. Stomach/Bowel: Stomach and limited of the small bowel is unremarkable Vascular/Lymphatic: Abdominal aortic normal caliber. No retroperitoneal periportal lymphadenopathy. Musculoskeletal: No aggressive osseous lesion IMPRESSION: 1. No hepatic lesion identified. All sequences could not be completed due to patient termination of exam due to pain. Opposed phase imaging not performed or postcontrast imaging. Lesion not identified on the provided sequences. Lesion may represent focal fatty sparing. As lesion is occult on CT and MRI not tolerated in this patient patient (at least currently), recommend follow-up ultrasound to demonstrate stability in 3-6 months. 2. Bibasilar atelectasis and effusions. 3. No biliary duct dilatation gallbladder sludge Electronically Signed   By: Suzy Bouchard M.D.   On: 06/01/2018 15:18     Principal Problem:   Orthostatic hypotension Active Problems:   Bipolar I disorder, current or most recent episode manic, with psychotic features (HCC)   Severe protein-calorie malnutrition ([place])   Hypocalcemia   Transaminitis   Fever   Thrombocytopenia ([place])     LOS: 4 days   Tye Savoy ,NP 06/03/2018, 10:55 AM

## 2018-06-04 LAB — COMPREHENSIVE METABOLIC PANEL
ALT: 46 U/L — ABNORMAL HIGH (ref 0–44)
ANION GAP: 4 — AB (ref 5–15)
AST: 311 U/L — ABNORMAL HIGH (ref 15–41)
Albumin: 1.5 g/dL — ABNORMAL LOW (ref 3.5–5.0)
Alkaline Phosphatase: 364 U/L — ABNORMAL HIGH (ref 38–126)
BILIRUBIN TOTAL: 0.5 mg/dL (ref 0.3–1.2)
BUN: 8 mg/dL (ref 6–20)
CO2: 23 mmol/L (ref 22–32)
Calcium: 7.4 mg/dL — ABNORMAL LOW (ref 8.9–10.3)
Chloride: 113 mmol/L — ABNORMAL HIGH (ref 98–111)
Creatinine, Ser: 0.38 mg/dL — ABNORMAL LOW (ref 0.44–1.00)
Glucose, Bld: 95 mg/dL (ref 70–99)
POTASSIUM: 4.1 mmol/L (ref 3.5–5.1)
Sodium: 140 mmol/L (ref 135–145)
TOTAL PROTEIN: 5.6 g/dL — AB (ref 6.5–8.1)

## 2018-06-04 LAB — CBC WITH DIFFERENTIAL/PLATELET
Abs Immature Granulocytes: 0.17 10*3/uL — ABNORMAL HIGH (ref 0.00–0.07)
Basophils Absolute: 0 10*3/uL (ref 0.0–0.1)
Basophils Relative: 0 %
EOS ABS: 0 10*3/uL (ref 0.0–0.5)
Eosinophils Relative: 0 %
HEMATOCRIT: 29.1 % — AB (ref 36.0–46.0)
Hemoglobin: 9 g/dL — ABNORMAL LOW (ref 12.0–15.0)
Immature Granulocytes: 8 %
LYMPHS ABS: 0.9 10*3/uL (ref 0.7–4.0)
Lymphocytes Relative: 37 %
MCH: 27.5 pg (ref 26.0–34.0)
MCHC: 30.9 g/dL (ref 30.0–36.0)
MCV: 89 fL (ref 80.0–100.0)
Monocytes Absolute: 0.3 10*3/uL (ref 0.1–1.0)
Monocytes Relative: 15 %
NEUTROS ABS: 0.9 10*3/uL — AB (ref 1.7–7.7)
NEUTROS PCT: 40 %
NRBC: 2.2 % — AB (ref 0.0–0.2)
Platelets: 60 10*3/uL — ABNORMAL LOW (ref 150–400)
RBC: 3.27 MIL/uL — ABNORMAL LOW (ref 3.87–5.11)
RDW: 15.7 % — AB (ref 11.5–15.5)
WBC: 2.3 10*3/uL — ABNORMAL LOW (ref 4.0–10.5)

## 2018-06-04 LAB — MAGNESIUM: MAGNESIUM: 1.9 mg/dL (ref 1.7–2.4)

## 2018-06-04 LAB — HEPARIN INDUCED PLATELET AB (HIT ANTIBODY): HEPARIN INDUCED PLT AB: 0.713 {OD_unit} — AB (ref 0.000–0.400)

## 2018-06-04 LAB — ANTI-MICROSOMAL ANTIBODY LIVER / KIDNEY: LKM1 Ab: 0.8 Units (ref 0.0–20.0)

## 2018-06-04 LAB — PHOSPHORUS: Phosphorus: 2.5 mg/dL (ref 2.5–4.6)

## 2018-06-04 LAB — HAPTOGLOBIN: Haptoglobin: 27 mg/dL — ABNORMAL LOW (ref 34–200)

## 2018-06-04 NOTE — Progress Notes (Signed)
PROGRESS NOTE    Yvonne [name]  AYT:[id number] DOB: 09-19-1965 DOA: 05/30/2018 PCP: Leonard Downing, MD   Brief Narrative:  HPI on 05/30/2018 by Dr. Gevena Barre Caley [name] is a 52 y.o. female with medical history significant for bipolar disorder who resides in a group home and presented to the emergency room after having a fall following lightheadedness.  In the emergency room, noted to have significant orthostatic hypotension.White count normal, although patient noted to have some mild transaminitis.  Mildly elevated lipase level although she does not report any abdominal pain.  Initially no evidence of fever.  Chest x-ray unremarkable.  Abdominal CT also unrevealing.  Hospitalist were called for further evaluation.  Following admission to the floor, patient spiked a fever of 101.4, remained tachycardic.  Lactic acid and procalcitonin level ordered.  Interim history Was admitted for orthostatic hypotension and found to have sepsis secondary to an unclear etiology.  She spiked temperature as well as became tachycardic, tachypnea with leukopenia.  Work-up thus far have returned no source.  She was noted to have positive autoimmune work-up and started on very low-dose prednisone.  Hematology consulted for leukopenia and thrombocytopenia.  Bone marrow biopsy planned for 06/05/2018.  GI also consulted for possible autoimmune hepatitis. Assessment & Plan   Sepsis secondary to unclear etiology -Patient did have a temperature of 101.8, tachycardia, tachypnea and leukopenia -Sepsis physiology does appear to have improved -Patient was empirically started on vancomycin and cefepime, and has received 5 days of antibiotics.  Will discontinue today and continue to watch. 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  Was placed on CIWA protocol empirically -Right upper quadrant ultrasound showed coarse echotexture of the hepatic parenchyma suggesting diffuse hepatocellular disease, possible hepatitis.  Alcoholic areas around the portal triads suggesting edema, potentially related obstruction secondary adenopathy at the porta hepatis region.  May need MRI for further evaluation for possible neoplasm. -UDS, alcohol level and salicylate level unremarkable on admission -Hepatitis panel unremarkable -MRCP did not show any hepatic lesion.  No biliary duct dilatation or gallbladder sludge. -Gastroenterology consulted and appreciated, may need liver biopsy -Antimicrosomal antibody pending -Continue to monitor CMP  Orthostatic hypotension -In the setting of dehydration from poor oral intake as well as sepsis -Patient given 2 L normal saline along with IV fluid continuous -PT OT recommended S  Pancytopenia -Patient with from cytopenia, anemia and leukopenia -Hematology oncology consulted and appreciated -Bone marrow biopsy planned for 06/05/2018 -Patient has been transfused 1 unit PRBC -Anemia panel showed iron 33, U IBC 82, TIBC 115, ferritin 5643, folate 9.3, vitamin B12 270 -Haptoglobin, HIT Pending -Continue to monitor CBC  Suspected autoimmune disease -Possibly lupus, Sjogren's, autoimmune hepatitis -LDH 1063, CRP 1.1, ESR 65 -ANA positive, double-stranded DNA 135, ENA SM antibody greater than 8, chromatin antibody greater than 8, ribonucleic protein 7.6, SSA antibody IgG greater than 8, SSB IgG G antibiogram than 8 -Concern for autoimmune disorders -F-actin IgG was 37 -Patient started on hydroxychloroquine 74m twice daily, prednisone 1 mg/kg daily -Patient will need to follow-up with rheumatology as an outpatient  Hypophosphatemia/ Hypomagnesemia/ Hypokalemia/ Hypocalcemia -improved, continue to  monitor -corrected calcium 9.4 (WNL)  Bipolar 1 disorder versus other underlying psychiatric disorder -Does not appear to be manic at this time -Continue aripiprazole, escitalopram  Underweight -Continue Ensure -  Nutrition consulted  DVT Prophylaxis  SCDs  Code Status: Full  Family Communication: None at bedside  Disposition Plan: Admitted. Pending bone marrow biopsy.   Consultants Hematology/Oncology Gastroenterology Rheumatology, via phone, Dr. Lenetta Quaker [name] (by Dr. Raiford Noble)  Procedures  RUQ [place] MRCP  Antibiotics   Anti-infectives (From admission, onward)   Start     Dose/Rate Route Frequency Ordered Stop   06/03/18 1000  hydroxychloroquine (PLAQUENIL) tablet 200 mg     200 mg Oral 2 times daily 06/03/18 0812     06/01/18 1730  vancomycin (VANCOCIN) IVPB 750 mg/150 ml premix  Status:  Discontinued     750 mg 150 mL/hr over 60 Minutes Intravenous Every 24 hours 05/31/18 1629 06/04/18 1125   05/31/18 1700  ceFEPIme (MAXIPIME) 1 g in sodium chloride 0.9 % 100 mL IVPB  Status:  Discontinued     1 g 200 mL/hr over 30 Minutes Intravenous Every 12 hours 05/31/18 1616 06/04/18 1125   05/31/18 1630  vancomycin (VANCOCIN) IVPB 1000 mg/200 mL premix     1,000 mg 200 mL/hr over 60 Minutes Intravenous  Once 05/31/18 1616 05/31/18 1810      Subjective:   Yvonne [name] seen and examined today.  No complaints today.  Denies current chest pain, shortness of breath, abdominal pain, nausea or vomiting, diarrhea or constipation.  Feels her weakness has mildly improved.  Objective:   Vitals:   06/03/18 1427 06/03/18 2051 06/04/18 0540 06/04/18 0908  BP: 114/75 107/71 106/78 115/75  Pulse: (!) 116 96 78 89  Resp: '17 18 19   ' Temp: 99.3 F (37.4 C) 99.6 F (37.6 C) 98.2 F (36.8 C)   TempSrc: Oral Oral Oral   SpO2: 99% 96% 100%   Weight:      Height:        Intake/Output Summary (Last 24 hours) at 06/04/2018 1142 Last data filed at 06/04/2018 0800 Gross per 24 hour   Intake 3112.57 ml  Output 3600 ml  Net -487.43 ml   Filed Weights   05/30/18 0854  Weight: 46.3 kg    Exam  General: Well developed, chronically ill-appearing, NAD  HEENT: NCAT, mucous membranes moist.   Neck: Supple  Cardiovascular: S1 S2 auscultated, no murmur RRR  Respiratory: Clear to auscultation bilaterally with equal chest rise  Abdomen: Soft, nontender, nondistended, + bowel sounds  Extremities: warm dry without cyanosis clubbing or edema  Neuro: AAOx3, nonfocal  Psych: appropriate mood and affect   Data Reviewed: I have personally reviewed following labs and imaging studies  CBC: Recent Labs  Lab 05/31/18 0614 06/01/18 0627 06/02/18 0611 06/03/18 0525 06/04/18 0534  WBC 2.5* 2.2* 1.9* 1.9* 2.3*  NEUTROABS  --  1.3* 0.9* 0.8* 0.9*  HGB 7.2* 7.1* 9.1* 9.4* 9.0*  HCT 24.0* 23.4* 28.1* 29.1* 29.1*  MCV 89.9 89.7 87.8 89.5 89.0  PLT 58* 52* 43* 49* 60*   Basic Metabolic Panel: Recent Labs  Lab 05/31/18 0614 05/31/18 0745 05/31/18 0749 06/01/18 0627 06/02/18 0611 06/03/18 0525 06/04/18 0534  NA 139  --   --  139 139 137 140  K 3.5  --   --  3.6 3.4* 4.0 4.1  CL 113*  --   --  113* 113* 110 113*  CO2 22  --   --  21* '23 23 23  ' GLUCOSE 105*  --   --  93 88 89 95  BUN 9  --   --  '7 6 6 8  ' CREATININE 0.52  --   --  0.43* 0.48 0.41* 0.38*  CALCIUM 7.3*  --   --  7.0* 6.7* 7.1* 7.4*  MG  --  1.8  --  1.6* 1.9 1.9 1.9  PHOS  --   --  2.1* 2.3* 2.9 2.2* 2.5   GFR: Estimated Creatinine Clearance: 59.4 mL/min (A) (by C-G formula based on SCr of 0.38 mg/dL (L)). Liver Function Tests: Recent Labs  Lab 05/31/18 0614 06/01/18 0627 06/02/18 0611 06/03/18 0525 06/04/18 0534  AST 302* 327* 324* 336* 311*  ALT 49* 49* 47* 50* 46*  ALKPHOS 238* 285* 330* 378* 364*  BILITOT 0.4 0.4 0.6 0.8 0.5  PROT 5.8* 5.6* 5.5* 5.7* 5.6*  ALBUMIN 1.7* 1.6* 1.5* 1.6* 1.5*   Recent Labs  Lab 05/30/18 1010 05/31/18 0614  LIPASE 61* 55*   No results for  input(s): AMMONIA in the last 168 hours. Coagulation Profile: Recent Labs  Lab 06/01/18 1344  INR 0.90   Cardiac Enzymes: No results for input(s): CKTOTAL, CKMB, CKMBINDEX, TROPONINI in the last 168 hours. BNP (last 3 results) No results for input(s): PROBNP in the last 8760 hours. HbA1C: No results for input(s): HGBA1C in the last 72 hours. CBG: No results for input(s): GLUCAP in the last 168 hours. Lipid Profile: No results for input(s): CHOL, HDL, LDLCALC, TRIG, CHOLHDL, LDLDIRECT in the last 72 hours. Thyroid Function Tests: No results for input(s): TSH, T4TOTAL, FREET4, T3FREE, THYROIDAB in the last 72 hours. Anemia Panel: No results for input(s): VITAMINB12, FOLATE, FERRITIN, TIBC, IRON, RETICCTPCT in the last 72 hours. Urine analysis:    Component Value Date/Time   COLORURINE YELLOW 05/24/2018 0111   APPEARANCEUR HAZY (A) 05/24/2018 0111   LABSPEC 1.017 05/24/2018 0111   PHURINE 5.0 05/24/2018 0111   GLUCOSEU NEGATIVE 05/24/2018 0111   HGBUR NEGATIVE 05/24/2018 0111   BILIRUBINUR NEGATIVE 05/24/2018 0111   KETONESUR NEGATIVE 05/24/2018 0111   PROTEINUR NEGATIVE 05/24/2018 0111   UROBILINOGEN 0.2 02/27/2014 0501   NITRITE NEGATIVE 05/24/2018 0111   LEUKOCYTESUR SMALL (A) 05/24/2018 0111   Sepsis Labs: '@LABRCNTIP' (procalcitonin:4,lacticidven:4)  ) Recent Results (from the past 240 hour(s))  Culture, blood (routine x 2)     Status: None (Preliminary result)   Collection Time: 05/31/18  7:45 AM  Result Value Ref Range Status   Specimen Description   Final    BLOOD RIGHT ANTECUBITAL Performed at [facility], [place] [address], [place], [place] [address]    Special Requests   Final    BOTTLES DRAWN AEROBIC AND ANAEROBIC Blood Culture adequate volume Performed at [facility] [address] [place], [place] [address]    Culture   Final    NO GROWTH 3 DAYS Performed at [facility], [place] [address] [place], [place]  [address]    Report Status PENDING  Incomplete  Culture, blood (routine x 2)     Status: None (Preliminary result)   Collection Time: 05/31/18  7:49 AM  Result Value Ref Range Status   Specimen Description   Final    BLOOD RIGHT ARM Performed at [facility] [address], [place], [place] [address]    Special Requests   Final    BOTTLES DRAWN AEROBIC AND ANAEROBIC Blood Culture adequate volume Performed at [facility] [address] [place], [place] [address]    Culture   Final    NO GROWTH 3 DAYS Performed at [facility], [place] [address] [place],  [address]    Report  Status PENDING  Incomplete  Culture, Urine     Status: Abnormal   Collection Time: 05/31/18  4:18 PM  Result Value Ref Range Status   Specimen Description   Final    URINE, RANDOM Performed at [facility] [address], [place], [place] [address]    Special Requests   Final    NONE Performed at [facility], [place] [address] [place], [place] [address]    Culture (A)  Final    60,000 COLONIES/mL MULTIPLE SPECIES PRESENT, SUGGEST RECOLLECTION   Report Status 06/02/2018 FINAL  Final      Radiology Studies: No results found.   Scheduled Meds: . ARIPiprazole  15 mg Oral Daily  . escitalopram  5 mg Oral Daily  . feeding supplement (ENSURE ENLIVE)  237 mL Oral BID BM  . folic acid  1 mg Oral Daily  . hydroxychloroquine  200 mg Oral BID  . metoprolol tartrate  25 mg Oral BID  . multivitamin with minerals  1 tablet Oral Daily  . predniSONE  50 mg Oral Q breakfast  . thiamine  100 mg Oral Daily   Or  . thiamine  100 mg Intravenous Daily   Continuous Infusions: . dextrose 5 % and 0.9% NaCl 50 mL/hr at 06/03/18 2338     LOS: 5 days   Time Spent in minutes   45 minutes (greater than 50% of time spent with patient face to face, as well as reviewing old records, discussing with consults, and formulating a  plan)   Rodnisha Blomgren D.O. on 06/04/2018 at 11:42 AM  Between 7am to 7pm - Please see pager noted on [email or web address]  After 7pm go to [email or web address]  And look for the night coverage person covering for me after hours  [facility] Office  [phone]

## 2018-06-04 NOTE — Progress Notes (Signed)
Yvonne [name]   DOB:January 14, 1966   ZO#:[id number]   JWJ#:[id number]  Subjective:  Feels "better." Eating "OK". Got OOB to recliner yesterday. Denies pain, rash, bleeding or bruising, joint swelling. No family in room   Objective: middle aged African American woman examined in bed Vitals:   06/04/18 0540 06/04/18 0908  BP: 106/78 115/75  Pulse: 78 89  Resp: 19   Temp: 98.2 F (36.8 C)   SpO2: 100%     Body mass index is 18.66 kg/m.  Intake/Output Summary (Last 24 hours) at 06/04/2018 6213 Last data filed at 06/04/2018 0800 Gross per 24 hour  Intake 3112.57 ml  Output 3600 ml  Net -487.43 ml     Sclerae unicteric  Oropharynx shows no thrush or other lesions  Lungs no rales or wheezes--auscultated anterolaterally  Heart regular rate and rhythm  Abdomen soft, +BS  Neuro nonfocal; affect more appropriate today--seemed interested as opposed to suspicious,answered questions (still not offering any spontaneous comments)    CBG (last 3)  No results for input(s): GLUCAP in the last 72 hours.   Labs:  Lab Results  Component Value Date   WBC 2.3 (L) 06/04/2018   HGB 9.0 (L) 06/04/2018   HCT 29.1 (L) 06/04/2018   MCV 89.0 06/04/2018   PLT 60 (L) 06/04/2018   NEUTROABS 0.9 (L) 06/04/2018    @LASTCHEMISTRY @  Urine Studies No results for input(s): UHGB, CRYS in the last 72 hours.  Invalid input(s): UACOL, UAPR, USPG, UPH, UTP, UGL, UKET, UBIL, UNIT, UROB, ULEU, UEPI, UWBC, URBC, UBAC, CAST, [place], [place]  Basic Metabolic Panel: Recent Labs  Lab 05/31/18 [phone] 05/31/18 0745 05/31/18 0749 06/01/18 0627 06/02/18 0611 06/03/18 0525 06/04/18 0534  NA 139  --   --  139 139 137 140  K 3.5  --   --  3.6 3.4* 4.0 4.1  CL 113*  --   --  113* 113* 110 113*  CO2 22  --   --  21* 23 23 23   GLUCOSE 105*  --   --  93 88 89 95  BUN 9  --   --  7 6 6 8   CREATININE 0.52  --   --  0.43* 0.48 0.41* 0.38*  CALCIUM 7.3*  --   --  7.0* 6.7* 7.1* 7.4*  MG  --  1.8  --  1.6* 1.9 1.9 1.9   PHOS  --   --  2.1* 2.3* 2.9 2.2* 2.5   GFR Estimated Creatinine Clearance: 59.4 mL/min (A) (by C-G formula based on SCr of 0.38 mg/dL (L)). Liver Function Tests: Recent Labs  Lab 05/31/18 0614 06/01/18 0627 06/02/18 0611 06/03/18 0525 06/04/18 0534  AST 302* 327* 324* 336* 311*  ALT 49* 49* 47* 50* 46*  ALKPHOS 238* 285* 330* 378* 364*  BILITOT 0.4 0.4 0.6 0.8 0.5  PROT 5.8* 5.6* 5.5* 5.7* 5.6*  ALBUMIN 1.7* 1.6* 1.5* 1.6* 1.5*   Recent Labs  Lab 05/30/18 1010 05/31/18 0614  LIPASE 61* 55*   No results for input(s): AMMONIA in the last 168 hours. Coagulation profile Recent Labs  Lab 06/01/18 1344  INR 0.90    CBC: Recent Labs  Lab 05/31/18 0614 06/01/18 0627 06/02/18 0611 06/03/18 0525 06/04/18 0534  WBC 2.5* 2.2* 1.9* 1.9* 2.3*  NEUTROABS  --  1.3* 0.9* 0.8* 0.9*  HGB 7.2* 7.1* 9.1* 9.4* 9.0*  HCT 24.0* 23.4* 28.1* 29.1* 29.1*  MCV 89.9 89.7 87.8 89.5 89.0  PLT 58* 52* 43* 49* 60*   Cardiac  Enzymes: No results for input(s): CKTOTAL, CKMB, CKMBINDEX, TROPONINI in the last 168 hours. BNP: Invalid input(s): POCBNP CBG: No results for input(s): GLUCAP in the last 168 hours. D-Dimer No results for input(s): DDIMER in the last 72 hours. Hgb A1c No results for input(s): HGBA1C in the last 72 hours. Lipid Profile No results for input(s): CHOL, HDL, LDLCALC, TRIG, CHOLHDL, LDLDIRECT in the last 72 hours. Thyroid function studies No results for input(s): TSH, T4TOTAL, T3FREE, THYROIDAB in the last 72 hours.  Invalid input(s): FREET3 Anemia work up No results for input(s): VITAMINB12, FOLATE, FERRITIN, TIBC, IRON, RETICCTPCT in the last 72 hours. Microbiology Recent Results (from the past 240 hour(s))  Culture, blood (routine x 2)     Status: None (Preliminary result)   Collection Time: 05/31/18  7:45 AM  Result Value Ref Range Status   Specimen Description   Final    BLOOD RIGHT ANTECUBITAL Performed at [facility], [address] [place], [place] [address]    Special Requests   Final    BOTTLES DRAWN AEROBIC AND ANAEROBIC Blood Culture adequate volume Performed at [facility], [address] [place], [place] [address]    Culture   Final    NO GROWTH 3 DAYS Performed at [facility], [address] [place], [place] [address]    Report Status PENDING  Incomplete  Culture, blood (routine x 2)     Status: None (Preliminary result)   Collection Time: 05/31/18  7:49 AM  Result Value Ref Range Status   Specimen Description   Final    BLOOD RIGHT ARM Performed at [facility], [address] [place], [place] [address]    Special Requests   Final    BOTTLES DRAWN AEROBIC AND ANAEROBIC Blood Culture adequate volume Performed at [facility], [address] [place], [place] [address]    Culture   Final    NO GROWTH 3 DAYS Performed at [facility], [address] [place], [place] [address]    Report Status PENDING  Incomplete  Culture, Urine     Status: Abnormal   Collection Time: 05/31/18  4:18 PM  Result Value Ref Range Status   Specimen Description   Final    URINE, RANDOM Performed at [facility], [address] [place], [place] [address]    Special Requests   Final    NONE Performed at [facility], [address] [place], [place] [address]    Culture (A)  Final    60,000 COLONIES/mL MULTIPLE SPECIES PRESENT, SUGGEST RECOLLECTION   Report Status 06/02/2018 FINAL  Final      Studies:  Dg Chest 2 View  Result Date: 05/30/2018 CLINICAL DATA:  Mental status change, possible fall, forehead hematoma, intermittent urinary incompetence. Generalized weakness. EXAM: CHEST - 2 VIEW COMPARISON:  PA and lateral chest x-ray of May 10, 2018 FINDINGS: The lungs are well-expanded. There is no focal infiltrate. There is no pleural effusion. The heart and pulmonary vascularity are normal.  The mediastinum is normal in width. The bony thorax exhibits no acute abnormality. IMPRESSION: There is no active cardiopulmonary disease. Electronically Signed   By: David  Swazil[place]Jordan M.D.   On: 05/30/2018 09:54   Dg Chest 2 View  Result Date: 05/10/2018 CLINICAL DATA:  Fever intermittently today. EXAM: CHEST - 2 VIEW COMPARISON:  None. FINDINGS: The heart size and mediastinal contours are within normal limits. Both lungs are clear.  The visualized skeletal structures are unremarkable. IMPRESSION: No active cardiopulmonary disease. Electronically Signed   By: Elberta Fortis M.D.   On: 05/10/2018 18:51   Ct Head Wo Contrast  Result Date: 05/30/2018 CLINICAL DATA:  Generalized weakness, recent fall EXAM: CT HEAD WITHOUT CONTRAST TECHNIQUE: Contiguous axial images were obtained from the base of the skull through the vertex without intravenous contrast. COMPARISON:  None. FINDINGS: Brain: No evidence of acute infarction, hemorrhage, hydrocephalus, extra-axial collection or mass lesion/mass effect. Vascular: No hyperdense vessel or unexpected calcification. Skull: Normal. Negative for fracture or focal lesion. Sinuses/Orbits: Mild partial opacification of the right frontal sinus and bilateral sphenoid sinuses. Mastoid air cells are clear. Other: Small extracranial hematoma overlying the left frontal bone (series 2/image 11). IMPRESSION: Small extracranial hematoma overlying the left frontal bone. No evidence of acute intracranial abnormality. Electronically Signed   By: Charline Bills M.D.   On: 05/30/2018 10:56   Ct Abdomen Pelvis W Contrast  Result Date: 05/30/2018 CLINICAL DATA:  Elevated liver function tests. Adult failure to thrive. EXAM: CT ABDOMEN AND PELVIS WITH CONTRAST TECHNIQUE: Multidetector CT imaging of the abdomen and pelvis was performed using the standard protocol following bolus administration of intravenous contrast. CONTRAST:  ISOVUE-300 IOPAMIDOL (ISOVUE-300) INJECTION 61%  COMPARISON:  CT scan of April 04, 2010. FINDINGS: Lower chest: No acute abnormality. Hepatobiliary: No focal liver abnormality is seen. No gallstones, gallbladder wall thickening, or biliary dilatation. Pancreas: Unremarkable. No pancreatic ductal dilatation or surrounding inflammatory changes. Spleen: Normal in size without focal abnormality. Adrenals/Urinary Tract: Adrenal glands are unremarkable. Kidneys are normal, without renal calculi, focal lesion, or hydronephrosis. Bladder is unremarkable. Stomach/Bowel: The stomach appears normal. There is no evidence of bowel obstruction or inflammation. The appendix is not visualized. Vascular/Lymphatic: Mildly enlarged mesenteric and retroperitoneal adenopathy is noted, all of which are less than 1 cm in size, and most likely are inflammatory in etiology. Reproductive: Multiple small uterine fibroids are noted. No adnexal abnormality is noted. Other: No abdominal wall hernia or abnormality. No abdominopelvic ascites. Musculoskeletal: No acute or significant osseous findings. IMPRESSION: Mildly enlarged mesenteric and retroperitoneal adenopathy is now noted, most likely inflammatory or reactive in etiology. Multiple small uterine fibroids are noted. Electronically Signed   By: Lupita Raider, M.D.   On: 05/30/2018 14:26   Mr Abdomen Mrcp Wo Contrast  Result Date: 06/01/2018 CLINICAL DATA:  Elevated liver function tests. Indeterminate lesion identified on ultrasound. MRI recommended. EXAM: MRI ABDOMEN WITHOUT CONTRAST  (INCLUDING MRCP) TECHNIQUE: Multiplanar multisequence MR imaging of the abdomen was performed. Heavily T2-weighted images of the biliary and pancreatic ducts were obtained, and three-dimensional MRCP images were rendered by post processing. COMPARISON:  Ultrasound 05/31/2018, [place] [address] FINDINGS: Patient unable to complete the entire exam due to pain. Lower chest:  Bilateral small effusions and bibasilar atelectasis. Hepatobiliary: No focal hepatic  lesion identified. No lesion corresponds to the hypodense lesion on comparison CT. Suspect lesion may represent focal fatty sparing. Opposed phase imaging was not performed due to patient discomfort. There is no intrahepatic or extrahepatic biliary duct dilatation. Pancreas: Normal pancreatic parenchymal intensity. No ductal dilatation or inflammation. Spleen: Spleen is mildly enlarged Adrenals/urinary tract: Adrenal glands and kidneys are normal. Stomach/Bowel: Stomach and limited of the small bowel is unremarkable Vascular/Lymphatic: Abdominal aortic normal caliber. No retroperitoneal periportal lymphadenopathy. Musculoskeletal: No aggressive osseous lesion IMPRESSION: 1. No hepatic lesion identified. All sequences could not be completed due to patient termination of exam due to pain. Opposed phase imaging not performed or postcontrast  imaging. Lesion not identified on the provided sequences. Lesion may represent focal fatty sparing. As lesion is occult on CT and MRI not tolerated in this patient patient (at least currently), recommend follow-up ultrasound to demonstrate stability in 3-6 months. 2. Bibasilar atelectasis and effusions. 3. No biliary duct dilatation gallbladder sludge Electronically Signed   By: Genevive Bi M.D.   On: 06/01/2018 15:18   Mr 3d Recon At Scanner  Result Date: 06/01/2018 CLINICAL DATA:  Elevated liver function tests. Indeterminate lesion identified on ultrasound. MRI recommended. EXAM: MRI ABDOMEN WITHOUT CONTRAST  (INCLUDING MRCP) TECHNIQUE: Multiplanar multisequence MR imaging of the abdomen was performed. Heavily T2-weighted images of the biliary and pancreatic ducts were obtained, and three-dimensional MRCP images were rendered by post processing. COMPARISON:  Ultrasound 05/31/2018, CT [address] FINDINGS: Patient unable to complete the entire exam due to pain. Lower chest:  Bilateral small effusions and bibasilar atelectasis. Hepatobiliary: No focal hepatic lesion identified.  No lesion corresponds to the hypodense lesion on comparison CT. Suspect lesion may represent focal fatty sparing. Opposed phase imaging was not performed due to patient discomfort. There is no intrahepatic or extrahepatic biliary duct dilatation. Pancreas: Normal pancreatic parenchymal intensity. No ductal dilatation or inflammation. Spleen: Spleen is mildly enlarged Adrenals/urinary tract: Adrenal glands and kidneys are normal. Stomach/Bowel: Stomach and limited of the small bowel is unremarkable Vascular/Lymphatic: Abdominal aortic normal caliber. No retroperitoneal periportal lymphadenopathy. Musculoskeletal: No aggressive osseous lesion IMPRESSION: 1. No hepatic lesion identified. All sequences could not be completed due to patient termination of exam due to pain. Opposed phase imaging not performed or postcontrast imaging. Lesion not identified on the provided sequences. Lesion may represent focal fatty sparing. As lesion is occult on CT and MRI not tolerated in this patient patient (at least currently), recommend follow-up ultrasound to demonstrate stability in 3-6 months. 2. Bibasilar atelectasis and effusions. 3. No biliary duct dilatation gallbladder sludge Electronically Signed   By: Genevive Bi M.D.   On: 06/01/2018 15:18   Dg Chest Port 1 View  Result Date: 05/31/2018 CLINICAL DATA:  Fever EXAM: PORTABLE CHEST 1 VIEW COMPARISON:  05/30/2018 chest radiograph FINDINGS: This is a mildly low volume film. The cardiomediastinal silhouette is unremarkable. There is no evidence of focal airspace disease, pulmonary edema, suspicious pulmonary nodule/mass, pleural effusion, or pneumothorax. No acute bony abnormalities are identified. IMPRESSION: Low volume film without acute abnormality. Electronically Signed   By: Harmon Pier M.D.   On: 05/31/2018 15:16   US Abdomen Limited Ruq  Result Date: 05/31/2018 CLINICAL DATA:  Abnormal liver function tests. EXAM: ULTRASOUND ABDOMEN LIMITED RIGHT UPPER  QUADRANT COMPARISON:  CT scan of May 30, 2018. FINDINGS: Gallbladder: No gallstones or wall thickening visualized. No sonographic Murphy sign noted by sonographer. Mild amount of sludge is seen within gallbladder lumen. Common bile duct: Diameter: 3 mm which is within normal limits. Liver: 2.3 cm hypoechoic solid abnormality is seen in the right hepatic lobe which is not visualized on prior CT scan. Coarse echotexture of hepatic parenchyma is noted suggesting diffuse hepatocellular disease. Hypoechoic areas are noted around the portal triads suggesting edema potentially related to hepatic inflammation or adenopathy at porta hepatis region. Minimal ascites is noted around the liver. Portal vein is patent on color Doppler imaging with normal direction of blood flow towards the liver. IMPRESSION: Coarse echotexture of hepatic parenchyma is noted suggesting diffuse hepatocellular disease such as possible hepatitis. Hypoechoic areas are noted around the portal triads suggesting edema potentially related to hepatic inflammation, or lymphatic obstruction  secondary to adenopathy at the porta hepatis region. Minimal ascites is noted around the liver. 2.3 cm rounded hypoechoic solid abnormality is seen in the right hepatic lobe which is not seen on prior CT scan. Further evaluation with MRI is recommended when patient can hold still to evaluate for possible neoplasm. Mild amount of sludge seen within gallbladder lumen. Electronically Signed   By: Lupita Raider, M.D.   On: 05/31/2018 12:42    Assessment: 52 y.o. [place] woman admitted 05/30/2018 following a fall, found to  have progressive cytopenias; other problems include fever with cultures negative to date, transaminitis with liver US suggesting a focal lesion but MRCP read as c/w focal fat sparing, history of bipolar disorder with recent admission for suicidal ideation  (1) SLE?-- multiple positive autoimmune titers; now on plaquenil; planning to increase  steroids after BMBX tomorrow, considering liver Bx   Plan:  A diagnosis of auto-immune disease would explain the patient's multiple symptoms (fever with negative cultures, elevated transaminases, pancytopenia). GI workup otherwise negative so far. Counts low but stable.  She is tolerating plaquenil well. I would prefer to wait until after BMBX to increase steroids, then may increase dose at your discretion  Will review BMBX results when available. Otherwise no other recommendations.  It will be important to have a confirmed outpatient rheumatology appointment for this patient on hand at the time of discharge.     Lowella Dell, MD 06/04/2018  9:22 AM Medical Oncology and Hematology [facility] [address] [place], [place] [address] Tel. [phone]    Fax. [phone]

## 2018-06-05 ENCOUNTER — Encounter (HOSPITAL_COMMUNITY): Payer: Self-pay | Admitting: Radiology

## 2018-06-05 ENCOUNTER — Inpatient Hospital Stay (HOSPITAL_COMMUNITY): Payer: Medicaid Other

## 2018-06-05 LAB — CULTURE, BLOOD (ROUTINE X 2)
CULTURE: NO GROWTH
Culture: NO GROWTH
SPECIAL REQUESTS: ADEQUATE
Special Requests: ADEQUATE

## 2018-06-05 LAB — COMPREHENSIVE METABOLIC PANEL
ALBUMIN: 1.8 g/dL — AB (ref 3.5–5.0)
ALK PHOS: 406 U/L — AB (ref 38–126)
ALT: 60 U/L — AB (ref 0–44)
AST: 351 U/L — AB (ref 15–41)
Anion gap: 3 — ABNORMAL LOW (ref 5–15)
BUN: 11 mg/dL (ref 6–20)
CO2: 28 mmol/L (ref 22–32)
Calcium: 7.9 mg/dL — ABNORMAL LOW (ref 8.9–10.3)
Chloride: 113 mmol/L — ABNORMAL HIGH (ref 98–111)
Creatinine, Ser: 0.45 mg/dL (ref 0.44–1.00)
GFR calc Af Amer: >60 mL/min (ref >60)
GFR calc non Af Amer: >60 mL/min (ref >60)
Glucose, Bld: 94 mg/dL (ref 70–99)
Potassium: 3.9 mmol/L (ref 3.5–5.1)
SODIUM: 144 mmol/L (ref 135–145)
TOTAL PROTEIN: 6.5 g/dL (ref 6.5–8.1)
Total Bilirubin: 0.6 mg/dL (ref 0.3–1.2)

## 2018-06-05 LAB — CBC WITH DIFFERENTIAL/PLATELET
ABS IMMATURE GRANULOCYTES: 0.14 10*3/uL — AB (ref 0.00–0.07)
Basophils Absolute: 0 10*3/uL (ref 0.0–0.1)
Basophils Relative: 0 %
Eosinophils Absolute: 0 10*3/uL (ref 0.0–0.5)
Eosinophils Relative: 0 %
HEMATOCRIT: 31 % — AB (ref 36.0–46.0)
HEMOGLOBIN: 9.7 g/dL — AB (ref 12.0–15.0)
IMMATURE GRANULOCYTES: 4 %
LYMPHS ABS: 1.4 10*3/uL (ref 0.7–4.0)
Lymphocytes Relative: 40 %
MCH: 28.4 pg (ref 26.0–34.0)
MCHC: 31.3 g/dL (ref 30.0–36.0)
MCV: 90.6 fL (ref 80.0–100.0)
MONO ABS: 0.6 10*3/uL (ref 0.1–1.0)
MONOS PCT: 19 %
NEUTROS ABS: 1.3 10*3/uL — AB (ref 1.7–7.7)
Neutrophils Relative %: 37 %
Platelets: 90 10*3/uL — ABNORMAL LOW (ref 150–400)
RBC: 3.42 MIL/uL — ABNORMAL LOW (ref 3.87–5.11)
RDW: 16.2 % — ABNORMAL HIGH (ref 11.5–15.5)
WBC: 3.5 10*3/uL — ABNORMAL LOW (ref 4.0–10.5)
nRBC: 2 % — ABNORMAL HIGH (ref 0.0–0.2)

## 2018-06-05 LAB — CK: CK TOTAL: 165 U/L (ref 38–234)

## 2018-06-05 LAB — TRIGLYCERIDES: TRIGLYCERIDES: 230 mg/dL — AB (ref <150)

## 2018-06-05 MED ORDER — NALOXONE HCL 0.4 MG/ML IJ SOLN
INTRAMUSCULAR | Status: AC
  Filled 2018-06-05: qty 1

## 2018-06-05 MED ORDER — MIDAZOLAM HCL 2 MG/2ML IJ SOLN
INTRAMUSCULAR | Status: AC | PRN
  Administered 2018-06-05 (×2): 1 mg via INTRAVENOUS

## 2018-06-05 MED ORDER — LIDOCAINE HCL (PF) 1 % IJ SOLN
INTRAMUSCULAR | Status: AC | PRN
  Administered 2018-06-05: 10 mL

## 2018-06-05 MED ORDER — FENTANYL CITRATE (PF) 100 MCG/2ML IJ SOLN
INTRAMUSCULAR | Status: AC
  Filled 2018-06-05: qty 2

## 2018-06-05 MED ORDER — FENTANYL CITRATE (PF) 100 MCG/2ML IJ SOLN
INTRAMUSCULAR | Status: AC | PRN
  Administered 2018-06-05 (×2): 50 ug via INTRAVENOUS

## 2018-06-05 MED ORDER — MIDAZOLAM HCL 2 MG/2ML IJ SOLN
INTRAMUSCULAR | Status: AC
  Filled 2018-06-05: qty 4

## 2018-06-05 MED ORDER — NALOXONE HCL 0.4 MG/ML IJ SOLN
0.4000 mg | INTRAMUSCULAR | Status: DC | PRN
  Administered 2018-06-05: 0.4 mg via INTRAVENOUS

## 2018-06-05 NOTE — Procedures (Signed)
CT guided bone marrow biopsy. 2 aspirates and 2 cores. Minimal blood loss and no immediate complication.   

## 2018-06-05 NOTE — Clinical Social Work Note (Signed)
Clinical Social Work Assessment  Patient Details  Name: Yvonne BalsamMelvine [name] MRN: [id number] Date of Birth: 08-10-1965  Date of referral:  06/05/18               Reason for consult:  (facility resident)                Permission sought to share information with:    Permission granted to share information::     Name::        Agency::  [facility]  Relationship::     Contact Information:     Housing/Transportation Living arrangements for the past 2 months:  Group Home Source of Information:  Facility Patient Interpreter Needed:  None Criminal Activity/Legal Involvement Pertinent to Current Situation/Hospitalization:  No - Comment as needed Significant Relationships:  Siblings, Merchandiser, retailCommunity Support Lives with:  Facility Resident Do you feel safe going back to the place where you live?  Yes Need for family participation in patient care:     Care giving concerns:  [facility] where she has resided since 2018. [facility]  Went to [facility] for inpatient psychiatric treatment (was reportedly suicidal) a couple of weeks ago, discharged home (group home) and then [facility] and was admitted for sepsis. Now suspected autoimmune disease. Pt to have biopsy of bone marrow today.    Social Worker assessment / plan:  CSW consulted to assist with disposition as pt is resident of facility- [facility], administrator Izell CarolinaAnitha Curian [phone]. Spoke with Anitha today- she is aware of pt's admission and requests most recent medical status- CSW referred her to unit staff for medical description. Informed Anitha of therapy's recommendation last week for pt potentially needing SNF at [facility]- Anitha states she is available to consult with for planning. Unable to meet with pt due to procedure today. Will continue following.   Employment status:  Disabled (Comment on whether or not currently receiving  Disability)(receives disability) Insurance information:  [place] Recommend[place]:  Skilled Nursing Facility, 24 Hour Supervision Information / Referral to community resources:     Patient/Family's Response to care:  uta  Patient/Family's Understanding of and Emotional Response to Diagnosis, Current Treatment, and Prognosis:  uta  Emotional Assessment Appearance:  Appears stated age Attitude/Demeanor/Rapport:    Affect (typically observed):    Orientation:    Alcohol / Substance use:    Psych involvement (Current and /or in the community):  Yes (Comment)(has long hx of psychiatric illness/treatment, recently hospitalized at [facility])  Discharge Needs  Concerns to be addressed:  Discharge Planning Concerns Readmission within the last 30 days:  No Current discharge risk:    Barriers to Discharge:  Continued Medical Work up   [employer] CorporationMeghan R Chandelle Harkey, LCSW 06/05/2018, 2:17 PM [phone] coverage for [phone]

## 2018-06-05 NOTE — Progress Notes (Signed)
Progress Note   Subjective  Chief Complaint: Weakness, fever, elevated LFTs   Patient is very tired this morning and does not answer many of my questions, just telling me that she would like to go back to sleep.  Denies any changes in her symptoms.    Objective   Vital signs in last 24 hours: Temp:  [97.5 F (36.4 C)-98.4 F (36.9 C)] 98.2 F (36.8 C) (11/25 0622) Pulse Rate:  [66-86] 77 (11/25 0622) Resp:  [18-19] 19 (11/25 0622) BP: (101-129)/(67-94) 126/94 (11/25 0622) SpO2:  [97 %-100 %] 100 % (11/25 0622)   General:    Ill-appearing AA female in NAD Heart:  Regular rate and rhythm; no murmurs Lungs: Respirations even and unlabored, lungs CTA bilaterally Abdomen:  Soft, nontender and nondistended. Normal bowel sounds. Extremities:  Without edema. Neurologic:  Alert and oriented,  grossly normal neurologically. Psych: Tired  Intake/Output from previous day: 11/24 0701 - 11/25 0700 In: 1811.4 [P.O.:440; I.V.:1271.4; IV Piggyback:100] Out: 1850 [Urine:1850]  Lab Results: Recent Labs    06/03/18 0525 06/04/18 0534 06/05/18 0553  WBC 1.9* 2.3* 3.5*  HGB 9.4* 9.0* 9.7*  HCT 29.1* 29.1* 31.0*  PLT 49* 60* 90*   BMET Recent Labs    06/03/18 0525 06/04/18 0534 06/05/18 0553  NA 137 140 144  K 4.0 4.1 3.9  CL 110 113* 113*  CO2 _0 GLUCOSE 89 95 94  BUN _1 CREATININE 0.41* 0.38* 0.45  CALCIUM 7.1* 7.4* 7.9*   Hepatic Function Latest Ref Rng & Units 06/05/2018 06/04/2018 06/03/2018  Total Protein 6.5 - 8.1 g/dL 6.5 5.6(L) 5.7(L)  Albumin 3.5 - 5.0 g/dL 1.8(L) 1.5(L) 1.6(L)  AST 15 - 41 U/L 351(H) 311(H) 336(H)  ALT 0 - 44 U/L 60(H) 46(H) 50(H)  Alk Phosphatase 38 - 126 U/L 406(H) 364(H) 378(H)  Total Bilirubin 0.3 - 1.2 mg/dL 0.6 0.5 0.8  Bilirubin, Direct 0.0 - 0.3 mg/dL - - -     Assessment / Plan:   Assessment: 1.  Elevated LFTs: Liver enzymes continue to increase over the past 72 hours, again ANA and ASMA positive with concern for  possible autoimmune hepatitis, MRCP was unremarkable 2.  Weakness/fever/progressive pancytopenia  Plan: 1.  Plans are for bone marrow biopsy today. 2.  We will continue to discuss need for liver biopsy with Dr. Stefani Dama Oddy 3.  Please await further recommendations later today  Thank you for kind consultation, we will continue to follow along.    LOS: 6 days   Yvonne [name]  06/05/2018, 9:42 AM

## 2018-06-05 NOTE — Progress Notes (Signed)
PT Cancellation Note  Patient Details Name: Yvonne [name] MRN: [id number] DOB: 10-13-1965   Cancelled Treatment:     CT Guided Bone Marrow Biopsy today.  Will check back another day as schedule permits.  Pt has been evaluated with rec to return to [facility].    Rica Koyanagi  PTA Acute  Rehabilitation Services Pager      [phone] Office      [phone]

## 2018-06-05 NOTE — Progress Notes (Signed)
PROGRESS NOTE    Yvonne [name]  EXH:[id number] DOB: 09-07-1965 DOA: 05/30/2018 PCP: Leonard Downing, MD   Brief Narrative:  HPI on 05/30/2018 by Dr. Gevena Barre Yvonne [name] is a 52 y.o. female with medical history significant for bipolar disorder who resides in a group home and presented to the emergency room after having a fall following lightheadedness.  In the emergency room, noted to have significant orthostatic hypotension.White count normal, although patient noted to have some mild transaminitis.  Mildly elevated lipase level although she does not report any abdominal pain.  Initially no evidence of fever.  Chest x-ray unremarkable.  Abdominal CT also unrevealing.  Hospitalist were called for further evaluation.  Following admission to the floor, patient spiked a fever of 101.4, remained tachycardic.  Lactic acid and procalcitonin level ordered.  Interim history Was admitted for orthostatic hypotension and found to have sepsis secondary to an unclear etiology.  She spiked temperature as well as became tachycardic, tachypnea with leukopenia.  Work-up thus far have returned no source.  She was noted to have positive autoimmune work-up and started on very low-dose prednisone.  Hematology consulted for leukopenia and thrombocytopenia.  Bone marrow biopsy planned for 06/05/2018.  GI also consulted for possible autoimmune hepatitis. Assessment & Plan   Sepsis secondary to unclear etiology -Patient did have a temperature of 101.8, tachycardia, tachypnea and leukopenia -Sepsis physiology does appear to have improved -Patient was empirically started on vancomycin and cefepime, and has received 5 days of antibiotics.  Will discontinue today and continue to watch. 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  Was placed on CIWA protocol empirically -Right upper quadrant ultrasound showed coarse echotexture of the hepatic parenchyma suggesting diffuse hepatocellular disease, possible hepatitis.  Alcoholic areas around the portal triads suggesting edema, potentially related obstruction secondary adenopathy at the porta hepatis region.  May need MRI for further evaluation for possible neoplasm. -UDS, alcohol level and salicylate level unremarkable on admission -Hepatitis panel unremarkable -MRCP did not show any hepatic lesion.  No biliary duct dilatation or gallbladder sludge. -Gastroenterology consulted and appreciated, may need liver biopsy -Antimicrosomal antibody 0.8 -Continue to monitor CMP  Orthostatic hypotension -In the setting of dehydration from poor oral intake as well as sepsis -Patient given 2 L normal saline along with IV fluid continuous -PT OT recommended SNF  Pancytopenia -Patient with from cytopenia, anemia and leukopenia -Hematology oncology consulted and appreciated -Bone marrow biopsy planned for 06/05/2018 -Patient has been transfused 1 unit PRBC -Anemia panel showed iron 33, U IBC 82, TIBC 115, ferritin 5643, folate 9.3, vitamin B12 270 -Haptoglobin 27, HIT 0.713 -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  -Continue to monitor CBC  Suspected autoimmune disease -Possibly lupus, Sjogren's, autoimmune hepatitis -LDH 1063, CRP 1.1, ESR 65 -ANA positive, double-stranded DNA 135, ENA SM antibody greater than 8, chromatin antibody greater than 8, ribonucleic protein 7.6, SSA antibody IgG greater than 8, SSB IgG G antibiogram than 8 -Concern for autoimmune disorders -F-actin IgG was 37 -Patient started on hydroxychloroquine 67m twice daily, prednisone 1 mg/kg daily -Patient will need to follow-up with rheumatology as an outpatient  Hypophosphatemia/ Hypomagnesemia/ Hypokalemia/  Hypocalcemia -improved, continue to monitor -corrected calcium 9.7 (WNL)  Bipolar 1 disorder versus other underlying psychiatric disorder -Does not appear to be manic at this time -  Continue aripiprazole, escitalopram  Underweight -Continue Ensure -Nutrition consulted  DVT Prophylaxis  SCDs  Code Status: Full  Family Communication: None at bedside  Disposition Plan: Admitted. Pending bone marrow biopsy results  Consultants Hematology/Oncology Gastroenterology Rheumatology, via phone, Dr. Lenetta Quaker [name] (by Dr. Raiford Noble)  Procedures  RUQ [place] MRCP  Antibiotics   Anti-infectives (From admission, onward)   Start     Dose/Rate Route Frequency Ordered Stop   06/03/18 1000  hydroxychloroquine (PLAQUENIL) tablet 200 mg     200 mg Oral 2 times daily 06/03/18 0812     06/01/18 1730  vancomycin (VANCOCIN) IVPB 750 mg/150 ml premix  Status:  Discontinued     750 mg 150 mL/hr over 60 Minutes Intravenous Every 24 hours 05/31/18 1629 06/04/18 1125   05/31/18 1700  ceFEPIme (MAXIPIME) 1 g in sodium chloride 0.9 % 100 mL IVPB  Status:  Discontinued     1 g 200 mL/hr over 30 Minutes Intravenous Every 12 hours 05/31/18 1616 06/04/18 1125   05/31/18 1630  vancomycin (VANCOCIN) IVPB 1000 mg/200 mL premix     1,000 mg 200 mL/hr over 60 Minutes Intravenous  Once 05/31/18 1616 05/31/18 1810      Subjective:   Yvonne [name] seen and examined today.  No complaints today. Not very interactive.   Objective:   Vitals:   06/05/18 1120 06/05/18 1125 06/05/18 1130 06/05/18 1157  BP: 105/72 1'03/72 98/60 97/69 '  Pulse: 90 82 84 75  Resp: (!) 7 (!) 6 (!) 6 (!) 6  Temp:    97.8 F (36.6 C)  TempSrc:    Oral  SpO2: 96% 97% 97% 97%  Weight:      Height:        Intake/Output Summary (Last 24 hours) at 06/05/2018 1220 Last data filed at 06/05/2018 0804 Gross per 24 hour  Intake 1166.51 ml  Output 1850 ml  Net -683.49 ml   Filed Weights   05/30/18 0854  Weight: 46.3 kg    Exam  General: Well developed, chronically ill appearing, NAD  HEENT: NCAT, mucous membranes moist.   Neck: Supple  Cardiovascular: S1 S2 auscultated, RRR, no murmur  Respiratory: Clear to auscultation bilaterally with equal chest rise  Abdomen: Soft, nontender, nondistended, + bowel sounds  Extremities: warm dry without cyanosis clubbing or edema  Neuro: AAOx3, nonfocal  Psych: Appropriate mood and affect   Data Reviewed: I have personally reviewed following labs and imaging studies  CBC: Recent Labs  Lab 06/01/18 0627 06/02/18 0611 06/03/18 0525 06/04/18 0534 06/05/18 0553  WBC 2.2* 1.9* 1.9* 2.3* 3.5*  NEUTROABS 1.3* 0.9* 0.8* 0.9* 1.3*  HGB 7.1* 9.1* 9.4* 9.0* 9.7*  HCT 23.4* 28.1* 29.1* 29.1* 31.0*  MCV 89.7 87.8 89.5 89.0 90.6  PLT 52* 43* 49* 60* 90*   Basic Metabolic Panel: Recent Labs  Lab 05/31/18 0745 05/31/18 0749 06/01/18 0627 06/02/18 0611 06/03/18 0525 06/04/18 0534 06/05/18 0553  NA  --   --  139 139 137 140 144  K  --   --  3.6 3.4* 4.0 4.1 3.9  CL  --   --  113* 113* 110 113* 113*  CO2  --   --  21* '23 23 23 28  ' GLUCOSE  --   --  93 88 89 95 94  BUN  --   --  '7 6 6 8 11  ' CREATININE  --   --  0.43* 0.48 0.41* 0.38* 0.45  CALCIUM  --   --  7.0*  6.7* 7.1* 7.4* 7.9*  MG 1.8  --  1.6* 1.9 1.9 1.9  --   PHOS  --  2.1* 2.3* 2.9 2.2* 2.5  --    GFR: Estimated Creatinine Clearance: 59.4 mL/min (by C-G formula based on SCr of 0.45 mg/dL). Liver Function Tests: Recent Labs  Lab 06/01/18 0627 06/02/18 0611 06/03/18 0525 06/04/18 0534 06/05/18 0553  AST 327* 324* 336* 311* 351*  ALT 49* 47* 50* 46* 60*  ALKPHOS 285* 330* 378* 364* 406*  BILITOT 0.4 0.6 0.8 0.5 0.6  PROT 5.6* 5.5* 5.7* 5.6* 6.5  ALBUMIN 1.6* 1.5* 1.6* 1.5* 1.8*   Recent Labs  Lab 05/30/18 1010 05/31/18 0614  LIPASE 61* 55*   No results for input(s): AMMONIA in the last 168 hours. Coagulation Profile: Recent Labs  Lab 06/01/18 1344  INR 0.90   Cardiac  Enzymes: Recent Labs  Lab 06/05/18 0553  CKTOTAL 165   BNP (last 3 results) No results for input(s): PROBNP in the last 8760 hours. HbA1C: No results for input(s): HGBA1C in the last 72 hours. CBG: No results for input(s): GLUCAP in the last 168 hours. Lipid Profile: Recent Labs    06/05/18 0553  TRIG 230*   Thyroid Function Tests: No results for input(s): TSH, T4TOTAL, FREET4, T3FREE, THYROIDAB in the last 72 hours. Anemia Panel: No results for input(s): VITAMINB12, FOLATE, FERRITIN, TIBC, IRON, RETICCTPCT in the last 72 hours. Urine analysis:    Component Value Date/Time   COLORURINE YELLOW 05/24/2018 0111   APPEARANCEUR HAZY (A) 05/24/2018 0111   LABSPEC 1.017 05/24/2018 0111   PHURINE 5.0 05/24/2018 0111   GLUCOSEU NEGATIVE 05/24/2018 0111   HGBUR NEGATIVE 05/24/2018 0111   BILIRUBINUR NEGATIVE 05/24/2018 0111   KETONESUR NEGATIVE 05/24/2018 0111   PROTEINUR NEGATIVE 05/24/2018 0111   UROBILINOGEN 0.2 02/27/2014 0501   NITRITE NEGATIVE 05/24/2018 0111   LEUKOCYTESUR SMALL (A) 05/24/2018 0111   Sepsis Labs: '@LABRCNTIP' (procalcitonin:4,lacticidven:4)  ) Recent Results (from the past 240 hour(s))  Culture, blood (routine x 2)     Status: None   Collection Time: 05/31/18  7:45 AM  Result Value Ref Range Status   Specimen Description   Final    BLOOD RIGHT ANTECUBITAL Performed at [facility], [place] [address], [place], [place] [address]    Special Requests   Final    BOTTLES DRAWN AEROBIC AND ANAEROBIC Blood Culture adequate volume Performed at [facility] [address] [place], [place] [address]    Culture   Final    NO GROWTH 5 DAYS Performed at [facility], [place] [address] [place], [place] [address]    Report Status 06/05/2018 FINAL  Final  Culture, blood (routine x 2)     Status: None   Collection Time: 05/31/18  7:49 AM  Result Value Ref Range Status   Specimen Description   Final    BLOOD RIGHT  ARM Performed at [facility] [address] [place], [place] [address]    Special Requests   Final    BOTTLES DRAWN AEROBIC AND ANAEROBIC Blood Culture adequate volume Performed at [facility] [address], [place], [place] [address]    Culture   Final    NO GROWTH 5 DAYS Performed at [facility], [place] [address], [place], [place] [address]    Report Status 06/05/2018 FINAL  Final  Culture, Urine     Status: Abnormal   Collection Time: 05/31/18  4:18 PM  Result Value Ref Range Status   Specimen Description   Final    URINE, RANDOM Performed at [facility] [address], [place], [place] [address]    Special Requests   Final    NONE Performed at [facility], [place] [address] [place], [place] [address]    Culture (A)  Final    60,000 COLONIES/mL MULTIPLE SPECIES PRESENT, SUGGEST RECOLLECTION   Report Status 06/02/2018 FINAL  Final      Radiology Studies: No results found.   Scheduled Meds: . ARIPiprazole  15 mg Oral Daily  . escitalopram  5 mg Oral Daily  . feeding supplement (ENSURE ENLIVE)  237 mL Oral BID BM  . fentaNYL      . folic acid  1 mg Oral Daily  . hydroxychloroquine  200 mg Oral BID  . metoprolol tartrate  25 mg Oral BID  . midazolam      . multivitamin with minerals  1 tablet Oral Daily  . predniSONE  50 mg Oral Q breakfast  . thiamine  100 mg Oral Daily   Or  . thiamine  100 mg Intravenous Daily   Continuous Infusions: . dextrose 5 % and 0.9% NaCl 50 mL/hr at 06/05/18 0624     LOS: 6 days   Time Spent in minutes   30 minutes   Arlin Savona D.O. on 06/05/2018 at 12:20 PM  Between 7am to 7pm - Please see pager noted on [email or web address]  After 7pm go to [email or web address]  And look for the night coverage person covering for me after hours  [facility] Office  [phone]

## 2018-06-06 LAB — CBC
HEMATOCRIT: 28.8 % — AB (ref 36.0–46.0)
HEMOGLOBIN: 8.9 g/dL — AB (ref 12.0–15.0)
MCH: 28.1 pg (ref 26.0–34.0)
MCHC: 30.9 g/dL (ref 30.0–36.0)
MCV: 90.9 fL (ref 80.0–100.0)
NRBC: 3 % — AB (ref 0.0–0.2)
Platelets: 102 10*3/uL — ABNORMAL LOW (ref 150–400)
RBC: 3.17 MIL/uL — AB (ref 3.87–5.11)
RDW: 16.2 % — ABNORMAL HIGH (ref 11.5–15.5)
WBC: 2.7 10*3/uL — AB (ref 4.0–10.5)

## 2018-06-06 LAB — BASIC METABOLIC PANEL
ANION GAP: 3 — AB (ref 5–15)
BUN: 12 mg/dL (ref 6–20)
CALCIUM: 7.8 mg/dL — AB (ref 8.9–10.3)
CHLORIDE: 113 mmol/L — AB (ref 98–111)
CO2: 28 mmol/L (ref 22–32)
Creatinine, Ser: 0.5 mg/dL (ref 0.44–1.00)
GFR calc non Af Amer: >60 mL/min (ref >60)
Glucose, Bld: 82 mg/dL (ref 70–99)
Potassium: 4 mmol/L (ref 3.5–5.1)
Sodium: 144 mmol/L (ref 135–145)

## 2018-06-06 LAB — HEPATIC FUNCTION PANEL
ALBUMIN: 1.7 g/dL — AB (ref 3.5–5.0)
ALK PHOS: 357 U/L — AB (ref 38–126)
ALT: 59 U/L — ABNORMAL HIGH (ref 0–44)
AST: 350 U/L — AB (ref 15–41)
BILIRUBIN TOTAL: 0.3 mg/dL (ref 0.3–1.2)
Bilirubin, Direct: 0.3 mg/dL — ABNORMAL HIGH (ref 0.0–0.2)
Indirect Bilirubin: 0 mg/dL — ABNORMAL LOW (ref 0.3–0.9)
Total Protein: 6.1 g/dL — ABNORMAL LOW (ref 6.5–8.1)

## 2018-06-06 NOTE — Progress Notes (Signed)
Nutrition Follow-up  DOCUMENTATION CODES:   Underweight  INTERVENTION:   Continue Ensure Enlive po BID, each supplement provides 350 kcal and 20 grams of protein  NUTRITION DIAGNOSIS:   Inadequate oral intake related to (mental status) as evidenced by per patient/family report, meal completion < 25%.  Ongoing.  GOAL:   Patient will meet greater than or equal to 90% of their needs  Progressing.  MONITOR:   PO intake, Supplement acceptance, Labs, Weight trends, I & O's  ASSESSMENT:   52 y.o. female with medical history significant for bipolar disorder who resides in a group home and presented to the emergency room after having a fall following lightheadedness.  Patient consuming 25-50% of meals at this time. Pt is drinking Ensure ~50% of the time. Since admission ,pt has since been found with a pressure injury on sacrum.   Per weight records, pt's weight has remained stable.   Medications: Folic acid tablet daily, Multivitamin with minerals daily, thiamine tablet daily, D5 -.9% NaCl infusion at 50 ml/hr Labs reviewed: Mg/Phos WNL TG: 230 mg/dL  Diet Order:   Diet Order            Diet regular Room service appropriate? Yes; Fluid consistency: Thin  Diet effective now              EDUCATION NEEDS:   Not appropriate for education at this time  Skin:  Skin Assessment: Skin Integrity Issues: Skin Integrity Issues:: Other (Comment) Other: pressure injury on sacrum  Last BM:  PTA  Height:   Ht Readings from Last 1 Encounters:  05/30/18 5\' 2"  (1.575 m)    Weight:   Wt Readings from Last 1 Encounters:  06/06/18 46.3 kg    Ideal Body Weight:  50 kg  BMI:  Body mass index is 18.67 kg/m.  Estimated Nutritional Needs:   Kcal:  1400-1600  Protein:  65-75g  Fluid:  1.6L/day  Yvonne FrancoLindsey Noemi Ishmael, MS, RD, LDN [facility] Clinical Dietitian Pager: [phone] After Hours Pager: [phone]

## 2018-06-06 NOTE — Progress Notes (Signed)
Occupational Therapy Treatment Patient Details Name: Yvonne [name] MRN: [id number] DOB: 03/26/1966 Today's Date: 06/06/2018    History of present illness 52 yo female with H/O bipolar DO, presented to ED after a fall, orthostatic hypotension, fever. patient resides in a group home.   OT comments  Pt participated well in therapy. Got up to chair and performed A/AAROM to bil UEs.  Encouraged pt to move arms herself as able  When therapist is not present  Follow Up Recommendations  SNF    Equipment Recommendations  3 in 1 bedside commode    Recommendations for Other Services      Precautions / Restrictions Precautions Precautions: Fall Restrictions Weight Bearing Restrictions: No       Mobility Bed Mobility     Rolling: Mod assist Sidelying to sit: Mod assist       General bed mobility comments: assist to roll and sit up  Transfers   Equipment used: 1 person hand held assist   Sit to Stand: Mod assist Stand pivot transfers: Min assist;Mod assist       General transfer comment: assist to rise and pivot to chair. Pt took small steps    Balance     Sitting balance-Leahy Scale: Fair(once she got her balance)                                     ADL either performed or assessed with clinical judgement   ADL                           Toilet Transfer: Moderate assistance;Stand-pivot(to chair)             General ADL Comments: got up to chair and positioned A/AAROM     Vision       Perception     Praxis      Cognition Arousal/Alertness: Awake/alert Behavior During Therapy: Flat affect                                   General Comments: improved attention (sustained) and follows one step commands within 5 seconds        Exercises  bil UE exercises:  AAROM FF, abduction, and biceps   Shoulder Instructions       General Comments      Pertinent Vitals/ Pain       Pain Assessment: Faces Faces Pain  Scale: Hurts even more Pain Location: did not specify Pain Intervention(s): Limited activity within patient's tolerance;Monitored during session  Home Living                                          Prior Functioning/Environment              Frequency     2x week      Progress Toward Goals  OT Goals(current goals can now be found in the care plan section)   Progressing     Plan      Co-evaluation                 AM-PAC OT "6 Clicks" Daily Activity     Outcome Measure   Help from another person eating meals?: A Little  Help from another person taking care of personal grooming?: A Little Help from another person toileting, which includes using toliet, bedpan, or urinal?: A Lot Help from another person bathing (including washing, rinsing, drying)?: A Lot Help from another person to put on and taking off regular upper body clothing?: A Lot Help from another person to put on and taking off regular lower body clothing?: Total 6 Click Score: 13    End of Session    OT Visit Diagnosis: Muscle weakness (generalized) (M62.81)   Activity Tolerance Patient tolerated treatment well   Patient Left in chair;with call bell/phone within reach;with chair alarm set;with family/visitor present   Nurse Communication  mobility status        Time: [id number] OT Time Calculation (min): 21 min  Charges: OT General Charges $OT Visit: 1 Visit OT Treatments $Therapeutic Activity: 8-22 mins  Marica OtterMaryellen Dajia Gunnels, OTR/L Acute Rehabilitation Services [phone] [facility] pager [phone] office 06/06/2018   Lynnita Somma 06/06/2018, 4:06 PM

## 2018-06-06 NOTE — Progress Notes (Signed)
Progress Note   Subjective  Chief Complaint: Weakness, fever, elevated LFTs  Today, patient is somewhat more talkative.  She tells me that she continues to be tired but there has been no acute change in her symptoms.  Her bone biopsy went well yesterday.  Denies any new complaints or concerns.   Objective   Vital signs in last 24 hours: Temp:  [97.8 F (36.6 C)-98.9 F (37.2 C)] 98.9 F (37.2 C) (11/26 0956) Pulse Rate:  [75-106] 105 (11/26 1103) Resp:  [6-20] 16 (11/26 0956) BP: (94-119)/(60-79) 104/68 (11/26 0956) SpO2:  [96 %-100 %] 100 % (11/26 0956) Weight:  [46.3 kg] 46.3 kg (11/26 0956)   General:  Ill appearing AA female in NAD Heart:  Regular rate and rhythm; no murmurs Lungs: Respirations even and unlabored, lungs CTA bilaterally Abdomen:  Soft, nontender and nondistended. Normal bowel sounds. Extremities:  Without edema. Neurologic:  Alert and oriented,  grossly normal neurologically. Psych:  Cooperative. Normal mood and affect.  Intake/Output from previous day: 11/25 0701 - 11/26 0700 In: 988 [P.O.:50; I.V.:938] Out: 2330 [Urine:1420] Intake/Output this shift: Total I/O In: 617.8 [P.O.:240; I.V.:377.8] Out: 200 [Urine:200]  Lab Results: Recent Labs    06/04/18 0534 06/05/18 0553 06/06/18 0548  WBC 2.3* 3.5* 2.7*  HGB 9.0* 9.7* 8.9*  HCT 29.1* 31.0* 28.8*  PLT 60* 90* 102*   BMET Recent Labs    06/04/18 0534 06/05/18 0553 06/06/18 0548  NA 140 144 144  K 4.1 3.9 4.0  CL 113* 113* 113*  CO2 _0 GLUCOSE 95 94 82  BUN _1 CREATININE 0.38* 0.45 0.50  CALCIUM 7.4* 7.9* 7.8*   LFT Recent Labs    06/06/18 0548  PROT 6.1*  ALBUMIN 1.7*  AST 350*  ALT 59*  ALKPHOS 357*  BILITOT 0.3  BILIDIR 0.3*  IBILI 0.0*   Studies/Results: Ct Biopsy  Result Date: 06/05/2018 INDICATION: 52 year old with pancytopenia.  Request for bone marrow biopsy. EXAM: CT GUIDED BONE MARROW ASPIRATES AND BIOPSY Physician: Stephan Minister. Anselm Pancoast, MD  MEDICATIONS: None. ANESTHESIA/SEDATION: Fentanyl 100 mcg IV; Versed 2.0 mg IV Moderate Sedation Time:  19 minutes The patient was continuously monitored during the procedure by the interventional radiology nurse under my direct supervision. COMPLICATIONS: None immediate. PROCEDURE: The procedure was explained to the patient. The risks and benefits of the procedure were discussed and the patient's questions were addressed. Informed consent was obtained from the patient. The patient was placed prone on CT table. Images of the pelvis were obtained. The right side of back was prepped and draped in sterile fashion. Maximal barrier sterile technique was utilized including caps, mask, sterile gowns, sterile gloves, sterile drape, hand hygiene and skin antiseptic. The skin and right posterior ilium were anesthetized with 1% lidocaine. 11 gauge bone needle was directed into the right ilium with CT guidance. Two aspirates and two core biopsies were obtained. Bandage placed over the puncture site. IMPRESSION: CT guided bone marrow aspiration and core biopsy. Electronically Signed   By: Markus Daft M.D.   On: 06/05/2018 12:50   Ct Bone Marrow Biopsy & Aspiration  Result Date: 06/05/2018 INDICATION: 52 year old with pancytopenia.  Request for bone marrow biopsy. EXAM: CT GUIDED BONE MARROW ASPIRATES AND BIOPSY Physician: Stephan Minister. Anselm Pancoast, MD MEDICATIONS: None. ANESTHESIA/SEDATION: Fentanyl 100 mcg IV; Versed 2.0 mg IV Moderate Sedation Time:  19 minutes The patient was continuously monitored during the procedure by the interventional radiology nurse under my direct supervision. COMPLICATIONS: None  immediate. PROCEDURE: The procedure was explained to the patient. The risks and benefits of the procedure were discussed and the patient's questions were addressed. Informed consent was obtained from the patient. The patient was placed prone on CT table. Images of the pelvis were obtained. The right side of back was prepped and draped  in sterile fashion. Maximal barrier sterile technique was utilized including caps, mask, sterile gowns, sterile gloves, sterile drape, hand hygiene and skin antiseptic. The skin and right posterior ilium were anesthetized with 1% lidocaine. 11 gauge bone needle was directed into the right ilium with CT guidance. Two aspirates and two core biopsies were obtained. Bandage placed over the puncture site. IMPRESSION: CT guided bone marrow aspiration and core biopsy. Electronically Signed   By: Markus Daft M.D.   On: 06/05/2018 12:50       Assessment / Plan:   Assessment: 1.  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  Weakness/fever/progressive pancytopenia  Plan: 1.  Awaiting results from bone marrow biopsy 2.  Continue to monitor LFTs, they are trending down today 3.  Please await any further recommendations from Dr. Rush Landmark later today.  Thank you for kind consultation, we will continue to follow.    LOS: 7 days   Levin Erp  06/06/2018, 11:13 AM

## 2018-06-06 NOTE — Progress Notes (Signed)
PROGRESS NOTE    Yvonne [name]  DHR:[id number] DOB: 1965/10/08 DOA: 05/30/2018 PCP: Leonard Downing, MD   Brief Narrative:  HPI on 05/30/2018 by Dr. Gevena Barre Yvonne [name] is a 52 y.o. female with medical history significant for bipolar disorder who resides in a group home and presented to the emergency room after having a fall following lightheadedness.  In the emergency room, noted to have significant orthostatic hypotension.White count normal, although patient noted to have some mild transaminitis.  Mildly elevated lipase level although she does not report any abdominal pain.  Initially no evidence of fever.  Chest x-ray unremarkable.  Abdominal CT also unrevealing.  Hospitalist were called for further evaluation.  Following admission to the floor, patient spiked a fever of 101.4, remained tachycardic.  Lactic acid and procalcitonin level ordered.  Interim history Was admitted for orthostatic hypotension and found to have sepsis secondary to an unclear etiology.  She spiked temperature as well as became tachycardic, tachypnea with leukopenia.  Work-up thus far have returned no source.  She was noted to have positive autoimmune work-up and started on very low-dose prednisone.  Hematology consulted for leukopenia and thrombocytopenia.  Bone marrow biopsy planned for 06/05/2018.  GI also consulted for possible autoimmune hepatitis.S/p Bone marrow biopsy. Pending PT recommendations for discharge planning. Assessment & Plan   Sepsis secondary to unclear etiology -Patient did have a temperature of 101.8, tachycardia, tachypnea and leukopenia -Sepsis physiology does appear to have improved -Patient was empirically started on vancomycin and cefepime, and has received 5 days of antibiotics, and discontinued.  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  Was placed on CIWA protocol empirically -Right upper quadrant ultrasound showed coarse echotexture of the hepatic parenchyma suggesting diffuse hepatocellular disease, possible hepatitis.  Alcoholic areas around the portal triads suggesting edema, potentially related obstruction secondary adenopathy at the porta hepatis region.  May need MRI for further evaluation for possible neoplasm. -UDS, alcohol level and salicylate level unremarkable on admission -Hepatitis panel unremarkable -MRCP did not show any hepatic lesion.  No biliary duct dilatation or gallbladder sludge. -Gastroenterology consulted and appreciated, may need liver biopsy -Antimicrosomal antibody 0.8 -Continue to monitor CMP  Orthostatic hypotension -In the setting of dehydration from poor oral intake as well as sepsis -Patient given 2 L normal saline along with IV fluid continuous -PT OT recommended SNF  Pancytopenia -Patient with from cytopenia, anemia and leukopenia -hemoglobin and platelets improving  -Hematology oncology consulted and appreciated -Bone marrow biopsy planned for 06/05/2018 -Patient has been transfused 1 unit PRBC -Anemia panel showed iron 33, U IBC 82, TIBC 115, ferritin 5643, folate 9.3, vitamin B12 270 -Haptoglobin 27, HIT 0.713 -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  -Continue to monitor CBC  Suspected autoimmune disease -Possibly lupus, Sjogren's, autoimmune hepatitis -LDH 1063, CRP 1.1, ESR 65 -ANA positive, double-stranded DNA 135, ENA SM antibody greater than 8, chromatin antibody greater than 8, ribonucleic protein 7.6, SSA antibody IgG greater than 8, SSB IgG G antibiogram than 8 -Concern for autoimmune disorders -F-actin IgG was 37 -Patient started on hydroxychloroquine 24m twice daily, prednisone 1 mg/kg daily -Patient will need to follow-up with rheumatology as an  outpatient  Hypophosphatemia/ Hypomagnesemia/ Hypokalemia/ Hypocalcemia -improved, continue to monitor -corrected calcium 9.7 (WNL)  Bipolar 1 disorder versus other underlying psychiatric disorder -Does  not appear to be manic at this time -Continue aripiprazole, escitalopram  Underweight -Continue Ensure -Nutrition consulted  DVT Prophylaxis  SCDs  Code Status: Full  Family Communication: None at bedside  Disposition Plan: Admitted. Pending PT reevaluation and results of biopsy  Consultants Hematology/Oncology Gastroenterology Rheumatology, via phone, Dr. Lenetta Quaker [name] (by Dr. Raiford Noble)  Procedures  RUQ [place] MRCP CT guided bone marrow biopsy   Antibiotics   Anti-infectives (From admission, onward)   Start     Dose/Rate Route Frequency Ordered Stop   06/03/18 1000  hydroxychloroquine (PLAQUENIL) tablet 200 mg     200 mg Oral 2 times daily 06/03/18 0812     06/01/18 1730  vancomycin (VANCOCIN) IVPB 750 mg/150 ml premix  Status:  Discontinued     750 mg 150 mL/hr over 60 Minutes Intravenous Every 24 hours 05/31/18 1629 06/04/18 1125   05/31/18 1700  ceFEPIme (MAXIPIME) 1 g in sodium chloride 0.9 % 100 mL IVPB  Status:  Discontinued     1 g 200 mL/hr over 30 Minutes Intravenous Every 12 hours 05/31/18 1616 06/04/18 1125   05/31/18 1630  vancomycin (VANCOCIN) IVPB 1000 mg/200 mL premix     1,000 mg 200 mL/hr over 60 Minutes Intravenous  Once 05/31/18 1616 05/31/18 1810      Subjective:   Yvonne [name] seen and examined today.  No complaints this morning.  Still feels weak.  Not wanting to get out of bed.  Denies current chest pain, shortness of breath, abdominal pain, nausea vomiting, diarrhea or constipation.   Objective:   Vitals:   06/06/18 0029 06/06/18 0406 06/06/18 0744 06/06/18 0956  BP: 106/66 94/61 101/60 104/68  Pulse: 93 85 97 93  Resp: '20 16 14 16  ' Temp: 98.7 F (37.1 C)  97.9 F (36.6 C) 98.9 F (37.2 C)  TempSrc: Oral  Oral Oral  SpO2: 100%  100% 97% 100%  Weight:    46.3 kg  Height:        Intake/Output Summary (Last 24 hours) at 06/06/2018 1059 Last data filed at 06/06/2018 0935 Gross per 24 hour  Intake 1605.75 ml  Output 1620 ml  Net -14.25 ml   Filed Weights   05/30/18 0854 06/06/18 0956  Weight: 46.3 kg 46.3 kg   Exam  General: Well developed, chronically ill-appearing, NAD  HEENT: NCAT, mucous membranes moist.   Neck: Supple  Cardiovascular: S1 S2 auscultated, no murmurs, RRR  Respiratory: Clear to auscultation bilaterally with equal chest rise  Abdomen: Soft, nontender, nondistended, + bowel sounds  Extremities: warm dry without cyanosis clubbing or edema  Neuro: AAOx3, nonfocal  Psych: Flat  Data Reviewed: I have personally reviewed following labs and imaging studies  CBC: Recent Labs  Lab 06/01/18 0627 06/02/18 0611 06/03/18 0525 06/04/18 0534 06/05/18 0553 06/06/18 0548  WBC 2.2* 1.9* 1.9* 2.3* 3.5* 2.7*  NEUTROABS 1.3* 0.9* 0.8* 0.9* 1.3*  --   HGB 7.1* 9.1* 9.4* 9.0* 9.7* 8.9*  HCT 23.4* 28.1* 29.1* 29.1* 31.0* 28.8*  MCV 89.7 87.8 89.5 89.0 90.6 90.9  PLT 52* 43* 49* 60* 90* 938*   Basic Metabolic Panel: Recent Labs  Lab 05/31/18 0745 05/31/18 0749 06/01/18 0627 06/02/18 0611 06/03/18 0525 06/04/18 0534 06/05/18 0553 06/06/18 0548  NA  --   --  139 139 137 140 144 144  K  --   --  3.6 3.4* 4.0 4.1 3.9 4.0  CL  --   --  113* 113* 110 113* 113* 113*  CO2  --   --  21* '23 23 23 28 28  ' GLUCOSE  --   --  93 88 89 95 94 82  BUN  --   --  '7 6 6 8 11 12  ' CREATININE  --   --  0.43* 0.48 0.41* 0.38* 0.45 0.50  CALCIUM  --   --  7.0* 6.7* 7.1* 7.4* 7.9* 7.8*  MG 1.8  --  1.6* 1.9 1.9 1.9  --   --   PHOS  --  2.1* 2.3* 2.9 2.2* 2.5  --   --    GFR: Estimated Creatinine Clearance: 59.4 mL/min (by C-G formula based on SCr of 0.5 mg/dL). Liver Function Tests: Recent Labs  Lab 06/02/18 0611 06/03/18 0525 06/04/18 0534 06/05/18 0553 06/06/18 0548  AST 324* 336* 311* 351*  350*  ALT 47* 50* 46* 60* 59*  ALKPHOS 330* 378* 364* 406* 357*  BILITOT 0.6 0.8 0.5 0.6 0.3  PROT 5.5* 5.7* 5.6* 6.5 6.1*  ALBUMIN 1.5* 1.6* 1.5* 1.8* 1.7*   Recent Labs  Lab 05/31/18 0614  LIPASE 55*   No results for input(s): AMMONIA in the last 168 hours. Coagulation Profile: Recent Labs  Lab 06/01/18 1344  INR 0.90   Cardiac Enzymes: Recent Labs  Lab 06/05/18 0553  CKTOTAL 165   BNP (last 3 results) No results for input(s): PROBNP in the last 8760 hours. HbA1C: No results for input(s): HGBA1C in the last 72 hours. CBG: No results for input(s): GLUCAP in the last 168 hours. Lipid Profile: Recent Labs    06/05/18 0553  TRIG 230*   Thyroid Function Tests: No results for input(s): TSH, T4TOTAL, FREET4, T3FREE, THYROIDAB in the last 72 hours. Anemia Panel: No results for input(s): VITAMINB12, FOLATE, FERRITIN, TIBC, IRON, RETICCTPCT in the last 72 hours. Urine analysis:    Component Value Date/Time   COLORURINE YELLOW 05/24/2018 0111   APPEARANCEUR HAZY (A) 05/24/2018 0111   LABSPEC 1.017 05/24/2018 0111   PHURINE 5.0 05/24/2018 0111   GLUCOSEU NEGATIVE 05/24/2018 0111   HGBUR NEGATIVE 05/24/2018 0111   BILIRUBINUR NEGATIVE 05/24/2018 0111   KETONESUR NEGATIVE 05/24/2018 0111   PROTEINUR NEGATIVE 05/24/2018 0111   UROBILINOGEN 0.2 02/27/2014 0501   NITRITE NEGATIVE 05/24/2018 0111   LEUKOCYTESUR SMALL (A) 05/24/2018 0111   Sepsis Labs: '@LABRCNTIP' (procalcitonin:4,lacticidven:4)  ) Recent Results (from the past 240 hour(s))  Culture, blood (routine x 2)     Status: None   Collection Time: 05/31/18  7:45 AM  Result Value Ref Range Status   Specimen Description   Final    BLOOD RIGHT ANTECUBITAL Performed at [facility], [place] [address] [place], [place] [address]    Special Requests   Final    BOTTLES DRAWN AEROBIC AND ANAEROBIC Blood Culture adequate volume Performed at [place] [address]  [place], [place] [address]    Culture   Final    NO GROWTH 5 DAYS Performed at [facility], [place] [address] [place], [place] [address]    Report Status 06/05/2018 FINAL  Final  Culture, blood (routine x 2)     Status: None   Collection Time: 05/31/18  7:49 AM  Result Value Ref Range Status   Specimen Description   Final    BLOOD RIGHT ARM Performed at [place] [address] [place], [place] [address]    Special Requests   Final    BOTTLES DRAWN AEROBIC AND ANAEROBIC Blood Culture adequate volume Performed at [facility],  [place] [address], [place], [place] [address]    Culture   Final    NO GROWTH 5 DAYS Performed at [facility], [place] [address] [place], [place] [address]    Report Status Jun 12, 2018 FINAL  Final  Culture, Urine     Status: Abnormal   Collection Time: 05/31/18  4:18 PM  Result Value Ref Range Status   Specimen Description   Final    URINE, RANDOM Performed at [facility] [address], [place], [place] [address]    Special Requests   Final    NONE Performed at [facility], [place] [address], [place], [place] [address]    Culture (A)  Final    60,000 COLONIES/mL MULTIPLE SPECIES PRESENT, SUGGEST RECOLLECTION   Report Status 06/02/2018 FINAL  Final      Radiology Studies: Ct Biopsy  Result Date: 12-Jun-2018 INDICATION: 52 year old with pancytopenia.  Request for bone marrow biopsy. EXAM: CT GUIDED BONE MARROW ASPIRATES AND BIOPSY Physician: Stephan Minister. Anselm Pancoast, MD MEDICATIONS: None. ANESTHESIA/SEDATION: Fentanyl 100 mcg IV; Versed 2.0 mg IV Moderate Sedation Time:  19 minutes The patient was continuously monitored during the procedure by the interventional radiology nurse under my direct supervision. COMPLICATIONS: None immediate. PROCEDURE: The procedure was explained to the patient. The risks and benefits of the procedure were discussed and the patient's questions were  addressed. Informed consent was obtained from the patient. The patient was placed prone on CT table. Images of the pelvis were obtained. The right side of back was prepped and draped in sterile fashion. Maximal barrier sterile technique was utilized including caps, mask, sterile gowns, sterile gloves, sterile drape, hand hygiene and skin antiseptic. The skin and right posterior ilium were anesthetized with 1% lidocaine. 11 gauge bone needle was directed into the right ilium with CT guidance. Two aspirates and two core biopsies were obtained. Bandage placed over the puncture site. IMPRESSION: CT guided bone marrow aspiration and core biopsy. Electronically Signed   By: Markus Daft M.D.   On: 06/12/2018 12:50   Ct Bone Marrow Biopsy & Aspiration  Result Date: June 12, 2018 INDICATION: 52 year old with pancytopenia.  Request for bone marrow biopsy. EXAM: CT GUIDED BONE MARROW ASPIRATES AND BIOPSY Physician: Stephan Minister. Anselm Pancoast, MD MEDICATIONS: None. ANESTHESIA/SEDATION: Fentanyl 100 mcg IV; Versed 2.0 mg IV Moderate Sedation Time:  19 minutes The patient was continuously monitored during the procedure by the interventional radiology nurse under my direct supervision. COMPLICATIONS: None immediate. PROCEDURE: The procedure was explained to the patient. The risks and benefits of the procedure were discussed and the patient's questions were addressed. Informed consent was obtained from the patient. The patient was placed prone on CT table. Images of the pelvis were obtained. The right side of back was prepped and draped in sterile fashion. Maximal barrier sterile technique was utilized including caps, mask, sterile gowns, sterile gloves, sterile drape, hand hygiene and skin antiseptic. The skin and right posterior ilium were anesthetized with 1% lidocaine. 11 gauge bone needle was directed into the right ilium with CT guidance. Two aspirates and two core biopsies were obtained. Bandage placed over the puncture site. IMPRESSION:  CT guided bone marrow aspiration and core biopsy. Electronically Signed   By: Markus Daft M.D.   On: 06/12/18 12:50     Scheduled Meds: . ARIPiprazole  15 mg Oral Daily  . escitalopram  5 mg Oral Daily  . feeding supplement (ENSURE ENLIVE)  237 mL Oral BID BM  . folic acid  1 mg Oral Daily  . hydroxychloroquine  200 mg Oral BID  . metoprolol tartrate  25 mg Oral BID  . multivitamin with minerals  1 tablet Oral Daily  . predniSONE  50 mg Oral Q breakfast  . thiamine  100 mg Oral Daily   Or  . thiamine  100 mg Intravenous Daily   Continuous Infusions: . dextrose 5 % and 0.9% NaCl 50 mL/hr at 06/06/18 0935     LOS: 7 days   Time Spent in minutes   30 minutes   Parsa Rickett D.O. on 06/06/2018 at 10:59 AM  Between 7am to 7pm - Please see pager noted on [email or web address]  After 7pm go to [email or web address]  And look for the night coverage person covering for me after hours  [facility] Office  [phone]

## 2018-06-07 LAB — COMPREHENSIVE METABOLIC PANEL
ALK PHOS: 422 U/L — AB (ref 38–126)
ALT: 69 U/L — ABNORMAL HIGH (ref 0–44)
ANION GAP: 5 (ref 5–15)
AST: 354 U/L — ABNORMAL HIGH (ref 15–41)
Albumin: 1.8 g/dL — ABNORMAL LOW (ref 3.5–5.0)
BILIRUBIN TOTAL: 0.4 mg/dL (ref 0.3–1.2)
BUN: 11 mg/dL (ref 6–20)
CALCIUM: 8 mg/dL — AB (ref 8.9–10.3)
CO2: 30 mmol/L (ref 22–32)
Chloride: 111 mmol/L (ref 98–111)
Creatinine, Ser: 0.42 mg/dL — ABNORMAL LOW (ref 0.44–1.00)
Glucose, Bld: 90 mg/dL (ref 70–99)
Potassium: 3.5 mmol/L (ref 3.5–5.1)
SODIUM: 146 mmol/L — AB (ref 135–145)
TOTAL PROTEIN: 6.3 g/dL — AB (ref 6.5–8.1)

## 2018-06-07 LAB — CBC
HEMATOCRIT: 28.2 % — AB (ref 36.0–46.0)
HEMOGLOBIN: 8.7 g/dL — AB (ref 12.0–15.0)
MCH: 28.2 pg (ref 26.0–34.0)
MCHC: 30.9 g/dL (ref 30.0–36.0)
MCV: 91.6 fL (ref 80.0–100.0)
Platelets: 108 10*3/uL — ABNORMAL LOW (ref 150–400)
RBC: 3.08 MIL/uL — ABNORMAL LOW (ref 3.87–5.11)
RDW: 16.4 % — ABNORMAL HIGH (ref 11.5–15.5)
WBC: 3.2 10*3/uL — ABNORMAL LOW (ref 4.0–10.5)
nRBC: 1.9 % — ABNORMAL HIGH (ref 0.0–0.2)

## 2018-06-07 NOTE — Progress Notes (Signed)
Chief Complaint: Patient was seen in consultation today for liver biopsy at the request of Tye Savoy, NP  Referring Physician(s): Tye Savoy, NP  Supervising Physician: Aletta Edouard  Patient Status: [facility] - In-pt  History of Present Illness: Yvonne [name] is a 52 y.o. female admitted with multiple issues. She is undergoing active workup for autoimmune disorder, possibly lupus. Her liver enzymes continue to trend up and GI has been following. BM bx was performed. IR is now asked to perform random liver biopsy. PMHx, meds, labs, imaging, allergies reviewed.    Past Medical History:  Diagnosis Date  . Bipolar 1 disorder ([place])   . Medical history non-contributory   . Mental disorder   . No pertinent past medical history     Past Surgical History:  Procedure Laterality Date  . NO PAST SURGERIES      Allergies: Patient has no known allergies.  Medications:  Current Facility-Administered Medications:  .  ARIPiprazole (ABILIFY) tablet 15 mg, 15 mg, Oral, Daily, Annita Brod, MD, 15 mg at 06/07/18 1018 .  dextrose 5 %-0.9 % sodium chloride infusion, , Intravenous, Continuous, Sheikh, Omair Latif, [place], Last Rate: 50 mL/hr at 06/07/18 0800 .  escitalopram (LEXAPRO) tablet 5 mg, 5 mg, Oral, Daily, Annita Brod, MD, 5 mg at 06/07/18 1019 .  feeding supplement (ENSURE ENLIVE) (ENSURE ENLIVE) liquid 237 mL, 237 mL, Oral, BID BM, Annita Brod, MD, 237 mL at 06/07/18 1023 .  folic acid (FOLVITE) tablet 1 mg, 1 mg, Oral, Daily, Sheikh, Omair Latif, DO, 1 mg at 06/07/18 1023 .  hydroxychloroquine (PLAQUENIL) tablet 200 mg, 200 mg, Oral, BID, Sheikh, Omair Latif, DO, 200 mg at 06/07/18 1019 .  hydrOXYzine (ATARAX/VISTARIL) tablet 25 mg, 25 mg, Oral, TID PRN, Annita Brod, MD, 25 mg at 06/05/18 2252 .  ibuprofen (ADVIL,MOTRIN) tablet 400 mg, 400 mg, Oral, Q6H PRN, Raiford Noble Latif, DO, 400 mg at 06/06/18 1111 .  metoprolol tartrate (LOPRESSOR)  tablet 25 mg, 25 mg, Oral, BID, Sheikh, Omair Latif, DO, 25 mg at 06/07/18 1019 .  multivitamin with minerals tablet 1 tablet, 1 tablet, Oral, Daily, Raiford Noble [place], DO, 1 tablet at 06/07/18 1019 .  naloxone [facility]) injection 0.4 mg, 0.4 mg, Intravenous, PRN, Cristal Ford, DO, 0.4 mg at 06/05/18 1225 .  ondansetron (ZOFRAN) tablet 4 mg, 4 mg, Oral, Q6H PRN **OR** ondansetron (ZOFRAN) injection 4 mg, 4 mg, Intravenous, Q6H PRN, Annita Brod, MD .  polyethylene glycol (MIRALAX / GLYCOLAX) packet 17 g, 17 g, Oral, Daily PRN, Annita Brod, MD .  predniSONE (DELTASONE) tablet 50 mg, 50 mg, Oral, Q breakfast, Raiford Noble [place], DO, 50 mg at 06/07/18 0754 .  thiamine (VITAMIN B-1) tablet 100 mg, 100 mg, Oral, Daily, 100 mg at 06/07/18 1019 **OR** thiamine (B-1) injection 100 mg, 100 mg, Intravenous, Daily, Raiford Noble Latif, DO, 100 mg at 05/31/18 1240    Family History  Problem Relation Age of Onset  . Depression Sister   . Depression Brother   . Depression Sister   . CAD Mother   . Hypertension Father   . Diabetes Father   . Bipolar disorder Cousin     Social History   Socioeconomic History  . Marital status: Single    Spouse name: Not on file  . Number of children: Not on file  . Years of education: Not on file  . Highest education level: Not on file  Occupational History  . Not on file  Social  Needs  . Financial resource strain: Not on file  . Food insecurity:    Worry: Not on file    Inability: Not on file  . Transportation needs:    Medical: Not on file    Non-medical: Not on file  Tobacco Use  . Smoking status: Never Smoker  . Smokeless tobacco: Never Used  Substance and Sexual Activity  . Alcohol use: No  . Drug use: No  . Sexual activity: Never  Lifestyle  . Physical activity:    Days per week: Not on file    Minutes per session: Not on file  . Stress: Not on file  Relationships  . Social connections:    Talks on phone: Not on file     Gets together: Not on file    Attends religious service: Not on file    Active member of club or organization: Not on file    Attends meetings of clubs or organizations: Not on file    Relationship status: Not on file  Other Topics Concern  . Not on file  Social History Narrative  . Not on file     Review of Systems: A 12 point ROS discussed and pertinent positives are indicated in the HPI above.  All other systems are negative.  Review of Systems  Vital Signs: BP 120/81 (BP Location: Left Arm)   Pulse 75   Temp 98.4 F (36.9 C)   Resp 18   Ht _0  (1.575 m)   Wt 46.3 kg   SpO2 100%   BMI 18.67 kg/m   Physical Exam  Constitutional: She is oriented to person, place, and time. No distress.  Cardiovascular: Normal rate, regular rhythm and normal heart sounds.  Pulmonary/Chest: Effort normal and breath sounds normal. No respiratory distress.  Abdominal: Soft. She exhibits no distension. There is no tenderness.  Neurological: She is alert and oriented to person, place, and time.  Skin: Skin is warm and dry.    Imaging: Dg Chest 2 View  Result Date: 05/30/2018 CLINICAL DATA:  Mental status change, possible fall, forehead hematoma, intermittent urinary incompetence. Generalized weakness. EXAM: CHEST - 2 VIEW COMPARISON:  PA and lateral chest x-ray of May 10, 2018 FINDINGS: The lungs are well-expanded. There is no focal infiltrate. There is no pleural effusion. The heart and pulmonary vascularity are normal. The mediastinum is normal in width. The bony thorax exhibits no acute abnormality. IMPRESSION: There is no active cardiopulmonary disease. Electronically Signed   By: David  Martinique M.D.   On: 05/30/2018 09:54   Dg Chest 2 View  Result Date: 05/10/2018 CLINICAL DATA:  Fever intermittently today. EXAM: CHEST - 2 VIEW COMPARISON:  None. FINDINGS: The heart size and mediastinal contours are within normal limits. Both lungs are clear. The visualized skeletal structures are  unremarkable. IMPRESSION: No active cardiopulmonary disease. Electronically Signed   By: Marin Olp M.D.   On: 05/10/2018 18:51   Ct Head Wo Contrast  Result Date: 05/30/2018 CLINICAL DATA:  Generalized weakness, recent fall EXAM: CT HEAD WITHOUT CONTRAST TECHNIQUE: Contiguous axial images were obtained from the base of the skull through the vertex without intravenous contrast. COMPARISON:  None. FINDINGS: Brain: No evidence of acute infarction, hemorrhage, hydrocephalus, extra-axial collection or mass lesion/mass effect. Vascular: No hyperdense vessel or unexpected calcification. Skull: Normal. Negative for fracture or focal lesion. Sinuses/Orbits: Mild partial opacification of the right frontal sinus and bilateral sphenoid sinuses. Mastoid air cells are clear. Other: Small extracranial hematoma overlying the left frontal bone (  series 2/image 11). IMPRESSION: Small extracranial hematoma overlying the left frontal bone. No evidence of acute intracranial abnormality. Electronically Signed   By: Julian Hy M.D.   On: 05/30/2018 10:56   Ct Abdomen Pelvis W Contrast  Result Date: 05/30/2018 CLINICAL DATA:  Elevated liver function tests. Adult failure to thrive. EXAM: CT ABDOMEN AND PELVIS WITH CONTRAST TECHNIQUE: Multidetector CT imaging of the abdomen and pelvis was performed using the standard protocol following bolus administration of intravenous contrast. CONTRAST:  159m ISOVUE-300 IOPAMIDOL (ISOVUE-300) INJECTION 61% COMPARISON:  CT scan of April 04, 2010. FINDINGS: Lower chest: No acute abnormality. Hepatobiliary: No focal liver abnormality is seen. No gallstones, gallbladder wall thickening, or biliary dilatation. Pancreas: Unremarkable. No pancreatic ductal dilatation or surrounding inflammatory changes. Spleen: Normal in size without focal abnormality. Adrenals/Urinary Tract: Adrenal glands are unremarkable. Kidneys are normal, without renal calculi, focal lesion, or hydronephrosis.  Bladder is unremarkable. Stomach/Bowel: The stomach appears normal. There is no evidence of bowel obstruction or inflammation. The appendix is not visualized. Vascular/Lymphatic: Mildly enlarged mesenteric and retroperitoneal adenopathy is noted, all of which are less than 1 cm in size, and most likely are inflammatory in etiology. Reproductive: Multiple small uterine fibroids are noted. No adnexal abnormality is noted. Other: No abdominal wall hernia or abnormality. No abdominopelvic ascites. Musculoskeletal: No acute or significant osseous findings. IMPRESSION: Mildly enlarged mesenteric and retroperitoneal adenopathy is now noted, most likely inflammatory or reactive in etiology. Multiple small uterine fibroids are noted. Electronically Signed   By: JMarijo Conception M.D.   On: 05/30/2018 14:26   Mr Abdomen Mrcp Wo Contrast  Result Date: 06/01/2018 CLINICAL DATA:  Elevated liver function tests. Indeterminate lesion identified on ultrasound. MRI recommended. EXAM: MRI ABDOMEN WITHOUT CONTRAST  (INCLUDING MRCP) TECHNIQUE: Multiplanar multisequence MR imaging of the abdomen was performed. Heavily T2-weighted images of the biliary and pancreatic ducts were obtained, and three-dimensional MRCP images were rendered by post processing. COMPARISON:  Ultrasound 05/31/2018, CT 136644FINDINGS: Patient unable to complete the entire exam due to pain. Lower chest:  Bilateral small effusions and bibasilar atelectasis. Hepatobiliary: No focal hepatic lesion identified. No lesion corresponds to the hypodense lesion on comparison CT. Suspect lesion may represent focal fatty sparing. Opposed phase imaging was not performed due to patient discomfort. There is no intrahepatic or extrahepatic biliary duct dilatation. Pancreas: Normal pancreatic parenchymal intensity. No ductal dilatation or inflammation. Spleen: Spleen is mildly enlarged Adrenals/urinary tract: Adrenal glands and kidneys are normal. Stomach/Bowel: Stomach and  limited of the small bowel is unremarkable Vascular/Lymphatic: Abdominal aortic normal caliber. No retroperitoneal periportal lymphadenopathy. Musculoskeletal: No aggressive osseous lesion IMPRESSION: 1. No hepatic lesion identified. All sequences could not be completed due to patient termination of exam due to pain. Opposed phase imaging not performed or postcontrast imaging. Lesion not identified on the provided sequences. Lesion may represent focal fatty sparing. As lesion is occult on CT and MRI not tolerated in this patient patient (at least currently), recommend follow-up ultrasound to demonstrate stability in 3-6 months. 2. Bibasilar atelectasis and effusions. 3. No biliary duct dilatation gallbladder sludge Electronically Signed   By: SSuzy BouchardM.D.   On: 06/01/2018 15:18   Mr 3d Recon At Scanner  Result Date: 06/01/2018 CLINICAL DATA:  Elevated liver function tests. Indeterminate lesion identified on ultrasound. MRI recommended. EXAM: MRI ABDOMEN WITHOUT CONTRAST  (INCLUDING MRCP) TECHNIQUE: Multiplanar multisequence MR imaging of the abdomen was performed. Heavily T2-weighted images of the biliary and pancreatic ducts were obtained, and three-dimensional MRCP images were  rendered by post processing. COMPARISON:  Ultrasound 05/31/2018, [place] [address] FINDINGS: Patient unable to complete the entire exam due to pain. Lower chest:  Bilateral small effusions and bibasilar atelectasis. Hepatobiliary: No focal hepatic lesion identified. No lesion corresponds to the hypodense lesion on comparison CT. Suspect lesion may represent focal fatty sparing. Opposed phase imaging was not performed due to patient discomfort. There is no intrahepatic or extrahepatic biliary duct dilatation. Pancreas: Normal pancreatic parenchymal intensity. No ductal dilatation or inflammation. Spleen: Spleen is mildly enlarged Adrenals/urinary tract: Adrenal glands and kidneys are normal. Stomach/Bowel: Stomach and limited of the  small bowel is unremarkable Vascular/Lymphatic: Abdominal aortic normal caliber. No retroperitoneal periportal lymphadenopathy. Musculoskeletal: No aggressive osseous lesion IMPRESSION: 1. No hepatic lesion identified. All sequences could not be completed due to patient termination of exam due to pain. Opposed phase imaging not performed or postcontrast imaging. Lesion not identified on the provided sequences. Lesion may represent focal fatty sparing. As lesion is occult on CT and MRI not tolerated in this patient patient (at least currently), recommend follow-up ultrasound to demonstrate stability in 3-6 months. 2. Bibasilar atelectasis and effusions. 3. No biliary duct dilatation gallbladder sludge Electronically Signed   By: Suzy Bouchard M.D.   On: 06/01/2018 15:18   Ct Biopsy  Result Date: 06/05/2018 INDICATION: 52 year old with pancytopenia.  Request for bone marrow biopsy. EXAM: CT GUIDED BONE MARROW ASPIRATES AND BIOPSY Physician: Stephan Minister. Anselm Pancoast, MD MEDICATIONS: None. ANESTHESIA/SEDATION: Fentanyl 100 mcg IV; Versed 2.0 mg IV Moderate Sedation Time:  19 minutes The patient was continuously monitored during the procedure by the interventional radiology nurse under my direct supervision. COMPLICATIONS: None immediate. PROCEDURE: The procedure was explained to the patient. The risks and benefits of the procedure were discussed and the patient's questions were addressed. Informed consent was obtained from the patient. The patient was placed prone on CT table. Images of the pelvis were obtained. The right side of back was prepped and draped in sterile fashion. Maximal barrier sterile technique was utilized including caps, mask, sterile gowns, sterile gloves, sterile drape, hand hygiene and skin antiseptic. The skin and right posterior ilium were anesthetized with 1% lidocaine. 11 gauge bone needle was directed into the right ilium with CT guidance. Two aspirates and two core biopsies were obtained. Bandage  placed over the puncture site. IMPRESSION: CT guided bone marrow aspiration and core biopsy. Electronically Signed   By: Markus Daft M.D.   On: 06/05/2018 12:50   Dg Chest Port 1 View  Result Date: 05/31/2018 CLINICAL DATA:  Fever EXAM: PORTABLE CHEST 1 VIEW COMPARISON:  05/30/2018 chest radiograph FINDINGS: This is a mildly low volume film. The cardiomediastinal silhouette is unremarkable. There is no evidence of focal airspace disease, pulmonary edema, suspicious pulmonary nodule/mass, pleural effusion, or pneumothorax. No acute bony abnormalities are identified. IMPRESSION: Low volume film without acute abnormality. Electronically Signed   By: Margarette Canada M.D.   On: 05/31/2018 15:16   Ct Bone Marrow Biopsy & Aspiration  Result Date: 06/05/2018 INDICATION: 52 year old with pancytopenia.  Request for bone marrow biopsy. EXAM: CT GUIDED BONE MARROW ASPIRATES AND BIOPSY Physician: Stephan Minister. Anselm Pancoast, MD MEDICATIONS: None. ANESTHESIA/SEDATION: Fentanyl 100 mcg IV; Versed 2.0 mg IV Moderate Sedation Time:  19 minutes The patient was continuously monitored during the procedure by the interventional radiology nurse under my direct supervision. COMPLICATIONS: None immediate. PROCEDURE: The procedure was explained to the patient. The risks and benefits of the procedure were discussed and the patient's questions were addressed. Informed consent was  obtained from the patient. The patient was placed prone on CT table. Images of the pelvis were obtained. The right side of back was prepped and draped in sterile fashion. Maximal barrier sterile technique was utilized including caps, mask, sterile gowns, sterile gloves, sterile drape, hand hygiene and skin antiseptic. The skin and right posterior ilium were anesthetized with 1% lidocaine. 11 gauge bone needle was directed into the right ilium with CT guidance. Two aspirates and two core biopsies were obtained. Bandage placed over the puncture site. IMPRESSION: CT guided bone  marrow aspiration and core biopsy. Electronically Signed   By: Markus Daft M.D.   On: 06/05/2018 12:50   US Abdomen Limited Ruq  Result Date: 05/31/2018 CLINICAL DATA:  Abnormal liver function tests. EXAM: ULTRASOUND ABDOMEN LIMITED RIGHT UPPER QUADRANT COMPARISON:  CT scan of May 30, 2018. FINDINGS: Gallbladder: No gallstones or wall thickening visualized. No sonographic Murphy sign noted by sonographer. Mild amount of sludge is seen within gallbladder lumen. Common bile duct: Diameter: 3 mm which is within normal limits. Liver: 2.3 cm hypoechoic solid abnormality is seen in the right hepatic lobe which is not visualized on prior CT scan. Coarse echotexture of hepatic parenchyma is noted suggesting diffuse hepatocellular disease. Hypoechoic areas are noted around the portal triads suggesting edema potentially related to hepatic inflammation or adenopathy at porta hepatis region. Minimal ascites is noted around the liver. Portal vein is patent on color Doppler imaging with normal direction of blood flow towards the liver. IMPRESSION: Coarse echotexture of hepatic parenchyma is noted suggesting diffuse hepatocellular disease such as possible hepatitis. Hypoechoic areas are noted around the portal triads suggesting edema potentially related to hepatic inflammation, or lymphatic obstruction secondary to adenopathy at the porta hepatis region. Minimal ascites is noted around the liver. 2.3 cm rounded hypoechoic solid abnormality is seen in the right hepatic lobe which is not seen on prior CT scan. Further evaluation with MRI is recommended when patient can hold still to evaluate for possible neoplasm. Mild amount of sludge seen within gallbladder lumen. Electronically Signed   By: Marijo Conception, M.D.   On: 05/31/2018 12:42    Labs:  CBC: Recent Labs    06/04/18 0534 06/05/18 0553 06/06/18 0548 06/07/18 0620  WBC 2.3* 3.5* 2.7* 3.2*  HGB 9.0* 9.7* 8.9* 8.7*  HCT 29.1* 31.0* 28.8* 28.2*  PLT 60*  90* 102* 108*    COAGS: Recent Labs    06/01/18 1344  INR 0.90    BMP: Recent Labs    06/04/18 0534 06/05/18 0553 06/06/18 0548 06/07/18 0620  NA 140 144 144 146*  K 4.1 3.9 4.0 3.5  CL 113* 113* 113* 111  CO2 _0 GLUCOSE 95 94 82 90  BUN _1 CALCIUM 7.4* 7.9* 7.8* 8.0*  CREATININE 0.38* 0.45 0.50 0.42*  GFRNONAA >60 >60 >60 >60  GFRAA >60 >60 >60 >60    LIVER FUNCTION TESTS: Recent Labs    06/04/18 0534 06/05/18 0553 06/06/18 0548 06/07/18 0620  BILITOT 0.5 0.6 0.3 0.4  AST 311* 351* 350* 354*  ALT 46* 60* 59* 69*  ALKPHOS 364* 406* 357* 422*  PROT 5.6* 6.5 6.1* 6.3*  ALBUMIN 1.5* 1.8* 1.7* 1.8*    TUMOR MARKERS: No results for input(s): AFPTM, CEA, CA199, CHROMGRNA in the last 8760 hours.  Assessment and Plan: Elevated liver enzymes, uncertain etiology, possibly autoimmune Liver biopsy requested. After discussion of the indication for this procedure and then explanation of the  procedure in detail, pt states she does not wish to have the procedure today. Discussed with ordering team. At this time, we will hold off until at least Fri 11/29 for biopsy. Diet resumed.  Thank you for this interesting consult.  I greatly enjoyed meeting Yvonne [name] and look forward to participating in their care.  A copy of this report was sent to the requesting provider on this date.  Electronically Signed: Ascencion Dike, PA-C 06/07/2018, 1:56 PM   I spent a total of 20 minutes in face to face in clinical consultation, greater than 50% of which was counseling/coordinating care for liver biopsy

## 2018-06-07 NOTE — Progress Notes (Signed)
Physical Therapy Treatment Patient Details Name: Yvonne [name] MRN: [id number] DOB: August 29, 1965 Today's Date: 06/07/2018    History of Present Illness 52 yo female with H/O bipolar DO, presented to ED after a fall, orthostatic hypotension, fever. patient resides in a group home. Currently doing tests and work up for abnormal liver enzymes, bone biopsy and may perform a liver biopsy.     PT Comments    Pt continues to be very weak ( general 3-/5 on B lower extremities L greater than R with weakness) also complaining of a lot of neck weakness with extension and pain with holding neck to neutral while sitting position.  However tolerating EOB activities today and sat on BSC 2xs during this visit. Walked with RW and Min/Mod A from [facility] to from recliner , but refused to walk any further today.   Encouraged pt to be OOB with nursing assisting to [facility] and to recliner to increase strength as much as possible. Pt was independent prior, however has great atrophy in all muscles and weakness with all functional mobility.     Follow Up Recommendations  SNF;Supervision/Assistance - 24 hour     Equipment Recommendations  None recommended by PT    Recommendations for Other Services       Precautions / Restrictions Precautions Precautions: Fall Restrictions Weight Bearing Restrictions: No    Mobility  Bed Mobility Overal bed mobility: Needs Assistance Bed Mobility: Rolling;Sidelying to Sit   Sidelying to sit: Min assist Supine to sit: Min assist     General bed mobility comments: slow movement and cues for scooting and sitting   Transfers Overall transfer level: Needs assistance Equipment used: Rolling walker (2 wheeled) Transfers: Sit to/from Stand Sit to Stand: Min assist Stand pivot transfers: Min assist       General transfer comment: assist to rise and pivot to chair. Pt took small steps, cues to RW use and manuevering   Ambulation/Gait Ambulation/Gait assistance: Min  assist;Mod assist Gait Distance (Feet): 7 Feet(twice in room from bed side commoade to chair and then back again but not placed right beside her bed or recliner. Placed further away so she would have to walk ) Assistive device: Rolling walker (2 wheeled) Gait Pattern/deviations: Step-through pattern     General Gait Details: very small steps, legs are very weak so had to assist with some weight bearing    Stairs             Wheelchair Mobility    Modified Rankin (Stroke Patients Only)       Balance Overall balance assessment: Needs assistance;History of Falls Sitting-balance support: Feet supported;Bilateral upper extremity supported Sitting balance-Leahy Scale: Good     Standing balance support: Bilateral upper extremity supported;During functional activity Standing balance-Leahy Scale: Fair                              Cognition Arousal/Alertness: Awake/alert Behavior During Therapy: Flat affect                                 Problem Solving: Slow processing;Requires verbal cues        Exercises Other Exercises Other Exercises: worked seated execises : all bilateral 5x each (AROM unless otherwise noted) : knee extesnions, knee extensions with DF and PF holds, hip flexion (AAROM ) , hip AB/Add    General Comments  Pertinent Vitals/Pain Pain Assessment: Faces Faces Pain Scale: Hurts little more Pain Location: pt complained of neck pain and with inability to hold in neutral position in sitting. Pt with flexed neck with little ability to rotate R or L or extend up to neutral  Pain Descriptors / Indicators: Discomfort;Grimacing Pain Intervention(s): Monitored during session;Repositioned    Home Living                      Prior Function            PT Goals (current goals can now be found in the care plan section) Acute Rehab PT Goals Patient Stated Goal: none stated PT Goal Formulation: With patient/family Time  For Goal Achievement: 07/21/18 Potential to Achieve Goals: Fair Progress towards PT goals: Progressing toward goals    Frequency    Min 2X/week      PT Plan Current plan remains appropriate    Co-evaluation              AM-PAC PT "6 Clicks" Mobility   Outcome Measure  Help needed turning from your back to your side while in a flat bed without using bedrails?: A Little Help needed moving from lying on your back to sitting on the side of a flat bed without using bedrails?: A Little Help needed moving to and from a bed to a chair (including a wheelchair)?: A Little Help needed standing up from a chair using your arms (e.g., wheelchair or bedside chair)?: A Lot Help needed to walk in hospital room?: A Lot Help needed climbing 3-5 steps with a railing? : A Lot 6 Click Score: 15    End of Session   Activity Tolerance: Patient tolerated treatment well Patient left: in chair;with chair alarm set;with call bell/phone within reach Nurse Communication: Mobility status(encouraged pt to have staff assist to bBSC to encourage pt movment and OOB activities ) PT Visit Diagnosis: Other abnormalities of gait and mobility (R26.89);Other symptoms and signs involving the nervous system (R29.898)     Time: [id number] PT Time Calculation (min) (ACUTE ONLY): 36 min  Charges:  $Therapeutic Activity: 23-37 mins                     Marella BileSharron Marella Vanderpol, PT Acute Rehabilitation Services Pager: [phone] Office: [phone] 06/07/2018    Marella BileBRITT, Akane Tessier 06/07/2018, 4:55 PM

## 2018-06-07 NOTE — Progress Notes (Signed)
     [facility] Progress Note   Chief Complaint:   Abnormal liver tests  SUBJECTIVE:    feels a little stronger than when admitted. No focal pain   ASSESSMENT AND PLAN:   1. Abnormal liver tests. Alk phos continues to rise (422), AST continues to rise (354). ALT stable at 69, normal Bili. -General concensus seems to be that this is not a primary liver problems. However, no good explanation for abnormal liver tests at this point. If she has lupus I don't think liver involvement is common. She could have overlapping autoimmune diseases. Before she is too far into the steroids, which could affect tissue results, it is reasonable to proceed with a liver biopsy since we still have some degree of concern. Patient is open to proceeding with it.  Will order.  2. Pancytopenia, improving on plaquenil and prednisone. I don't see results of bone marrow biopsy but per Heme/Onc note this am it was suggestive of mild reactive changes. No lymphoma, leukemia, other cancers. At this point the working diagnosis is autoimmune disease, ? SLE. She is getting Plaquenil and Prednisone.   OBJECTIVE:     Vital signs in last 24 hours: Temp:  [98 F (36.7 C)-98.8 F (37.1 C)] 98.1 F (36.7 C) (11/27 0553) Pulse Rate:  [71-105] 71 (11/27 0553) Resp:  [16-19] 16 (11/27 0553) BP: (111-124)/(68-75) 124/74 (11/27 0553) SpO2:  [98 %-100 %] 98 % (11/27 0553)   General:   Alert, thin in NAD EENT:  Normal hearing, non icteric sclera, conjunctive pink.  Heart:  Regular rate and rhythm; no murmur.  No lower extremity edema   Pulm: Normal respiratory effort Abdomen:  Soft, nondistended, nontender.  Normal bowel sounds, no masses felt.       Neurologic:  Alert and  oriented x4;  grossly normal neurologically. Psych:  cooperative.  Flat affect.   Intake/Output from previous day: 11/26 0701 - 11/27 0700 In: 1126.2 [P.O.:480; I.V.:646.2] Out: 1100 [Urine:1100] Intake/Output this shift: Total  I/O In: 1149.6 [P.O.:300; I.V.:849.6] Out: 1150 [Urine:1150]  Lab Results: Recent Labs    06/05/18 0553 06/06/18 0548 06/07/18 0620  WBC 3.5* 2.7* 3.2*  HGB 9.7* 8.9* 8.7*  HCT 31.0* 28.8* 28.2*  PLT 90* 102* 108*   BMET Recent Labs    06/05/18 0553 06/06/18 0548 06/07/18 0620  NA 144 144 146*  K 3.9 4.0 3.5  CL 113* 113* 111  CO2 _0 GLUCOSE 94 82 90  BUN _1 CREATININE 0.45 0.50 0.42*  CALCIUM 7.9* 7.8* 8.0*   LFT Recent Labs    06/06/18 0548 06/07/18 0620  PROT 6.1* 6.3*  ALBUMIN 1.7* 1.8*  AST 350* 354*  ALT 59* 69*  ALKPHOS 357* 422*  BILITOT 0.3 0.4  BILIDIR 0.3*  --   IBILI 0.0*  --     Principal Problem:   Orthostatic hypotension Active Problems:   Bipolar I disorder, current or most recent episode manic, with psychotic features (HCC)   Severe protein-calorie malnutrition (HCC)   Hypocalcemia   Transaminitis   Fever   Thrombocytopenia ([place])     LOS: 8 days   Tye Savoy ,NP 06/07/2018, 10:03 AM

## 2018-06-07 NOTE — Progress Notes (Signed)
Yvonne [name]   DOB:1966/06/29   OH#:[id number]   IRS#:[id number]  Subjective:  Met with patient to discuss BOMBX results. Patient still very guarded in her responses; tells me she got OOBTC yesterday; not ambulating (not clear why not)' tolerating plaquenil and prednisone well. No family in room   Objective: middle aged African American woman examined in bed Vitals:   06/06/18 2013 06/07/18 0553  BP: 117/75 124/74  Pulse: 79 71  Resp: 19 16  Temp: 98 F (36.7 C) 98.1 F (36.7 C)  SpO2: 100% 98%    Body mass index is 18.67 kg/m.  Intake/Output Summary (Last 24 hours) at 06/07/2018 0743 Last data filed at 06/06/2018 1500 Gross per 24 hour  Intake 1126.21 ml  Output 1100 ml  Net 26.21 ml     Sclerae unicteric   CBG (last 3)  No results for input(s): GLUCAP in the last 72 hours.   Labs:  Lab Results  Component Value Date   WBC 3.2 (L) 06/07/2018   HGB 8.7 (L) 06/07/2018   HCT 28.2 (L) 06/07/2018   MCV 91.6 06/07/2018   PLT 108 (L) 06/07/2018   NEUTROABS 1.3 (L) 06/05/2018    _0 @  Urine Studies No results for input(s): UHGB, CRYS in the last 72 hours.  Invalid input(s): UACOL, UAPR, USPG, UPH, UTP, UGL, UKET, UBIL, UNIT, UROB, ULEU, UEPI, UWBC, URBC, UBAC, CAST, [place], [place]  Basic Metabolic Panel: Recent Labs  Lab 05/31/18 0745 05/31/18 0749  06/01/18 0093 06/02/18 8182 06/03/18 0525 06/04/18 0534 06/05/18 0553 06/06/18 0548 06/07/18 0620  NA  --   --    < > 139 139 137 140 144 144 146*  K  --   --    < > 3.6 3.4* 4.0 4.1 3.9 4.0 3.5  CL  --   --    < > 113* 113* 110 113* 113* 113* 111  CO2  --   --    < > 21* _1 GLUCOSE  --   --    < > 93 88 89 95 94 82 90  BUN  --   --    < > _2 CREATININE  --   --    < > 0.43* 0.48 0.41* 0.38* 0.45 0.50 0.42*  CALCIUM  --   --    < > 7.0* 6.7* 7.1* 7.4* 7.9* 7.8* 8.0*  MG 1.8  --   --  1.6* 1.9 1.9 1.9  --   --   --   PHOS  --  2.1*  --  2.3* 2.9 2.2* 2.5  --   --   --     < > = values in this interval not displayed.   GFR Estimated Creatinine Clearance: 59.4 mL/min (A) (by C-G formula based on SCr of 0.42 mg/dL (L)). Liver Function Tests: Recent Labs  Lab 06/03/18 0525 06/04/18 0534 06/05/18 0553 06/06/18 0548 06/07/18 0620  AST 336* 311* 351* 350* 354*  ALT 50* 46* 60* 59* 69*  ALKPHOS 378* 364* 406* 357* 422*  BILITOT 0.8 0.5 0.6 0.3 0.4  PROT 5.7* 5.6* 6.5 6.1* 6.3*  ALBUMIN 1.6* 1.5* 1.8* 1.7* 1.8*   No results for input(s): LIPASE, AMYLASE in the last 168 hours. No results for input(s): AMMONIA in the last 168 hours. Coagulation profile Recent Labs  Lab 06/01/18 1344  INR 0.90    CBC: Recent Labs  Lab 06/01/18 0627 06/02/18 0611 06/03/18 0525  06/04/18 0534 06/05/18 0553 06/06/18 0548 06/07/18 0620  WBC 2.2* 1.9* 1.9* 2.3* 3.5* 2.7* 3.2*  NEUTROABS 1.3* 0.9* 0.8* 0.9* 1.3*  --   --   HGB 7.1* 9.1* 9.4* 9.0* 9.7* 8.9* 8.7*  HCT 23.4* 28.1* 29.1* 29.1* 31.0* 28.8* 28.2*  MCV 89.7 87.8 89.5 89.0 90.6 90.9 91.6  PLT 52* 43* 49* 60* 90* 102* 108*   Cardiac Enzymes: Recent Labs  Lab 06/05/18 0553  CKTOTAL 165   BNP: Invalid input(s): POCBNP CBG: No results for input(s): GLUCAP in the last 168 hours. D-Dimer No results for input(s): DDIMER in the last 72 hours. Hgb A1c No results for input(s): HGBA1C in the last 72 hours. Lipid Profile Recent Labs    06/05/18 0553  TRIG 230*   Thyroid function studies No results for input(s): TSH, T4TOTAL, T3FREE, THYROIDAB in the last 72 hours.  Invalid input(s): FREET3 Anemia work up No results for input(s): VITAMINB12, FOLATE, FERRITIN, TIBC, IRON, RETICCTPCT in the last 72 hours. Microbiology Recent Results (from the past 240 hour(s))  Culture, blood (routine x 2)     Status: None   Collection Time: 05/31/18  7:45 AM  Result Value Ref Range Status   Specimen Description   Final    BLOOD RIGHT ANTECUBITAL Performed at [facility] [address],  [place], [place] [address]    Special Requests   Final    BOTTLES DRAWN AEROBIC AND ANAEROBIC Blood Culture adequate volume Performed at [facility] [address], [place], [place] [address]    Culture   Final    NO GROWTH 5 DAYS Performed at [facility], [place] [address], [place], [place] [address]    Report Status 06/05/2018 FINAL  Final  Culture, blood (routine x 2)     Status: None   Collection Time: 05/31/18  7:49 AM  Result Value Ref Range Status   Specimen Description   Final    BLOOD RIGHT ARM Performed at [facility] [address] [place], [place] [address]    Special Requests   Final    BOTTLES DRAWN AEROBIC AND ANAEROBIC Blood Culture adequate volume Performed at [facility] [address] [place], [place] [address]    Culture   Final    NO GROWTH 5 DAYS Performed at [facility], [place] [address], [place], [place] [address]    Report Status 06/05/2018 FINAL  Final  Culture, Urine     Status: Abnormal   Collection Time: 05/31/18  4:18 PM  Result Value Ref Range Status   Specimen Description   Final    URINE, RANDOM Performed at [facility] [address], [place], [place] [address]    Special Requests   Final    NONE Performed at [facility], [place] [address], [place],  [address]    Culture (A)  Final    60,000 COLONIES/mL MULTIPLE SPECIES PRESENT, SUGGEST RECOLLECTION   Report Status 06/02/2018 FINAL  Final      Studies:  Dg Chest 2 View  Result Date: 05/30/2018 CLINICAL DATA:  Mental status change, possible fall, forehead hematoma, intermittent urinary incompetence. Generalized weakness. EXAM: CHEST - 2 VIEW COMPARISON:  PA and lateral chest x-ray of May 10, 2018 FINDINGS: The lungs are well-expanded. There is no focal infiltrate. There is no pleural effusion. The heart and pulmonary vascularity are normal. The mediastinum is normal in  width. The bony thorax exhibits no  acute abnormality. IMPRESSION: There is no active cardiopulmonary disease. Electronically Signed   By: David  Martinique M.D.   On: 05/30/2018 09:54   Dg Chest 2 View  Result Date: 05/10/2018 CLINICAL DATA:  Fever intermittently today. EXAM: CHEST - 2 VIEW COMPARISON:  None. FINDINGS: The heart size and mediastinal contours are within normal limits. Both lungs are clear. The visualized skeletal structures are unremarkable. IMPRESSION: No active cardiopulmonary disease. Electronically Signed   By: Marin Olp M.D.   On: 05/10/2018 18:51   Ct Head Wo Contrast  Result Date: 05/30/2018 CLINICAL DATA:  Generalized weakness, recent fall EXAM: CT HEAD WITHOUT CONTRAST TECHNIQUE: Contiguous axial images were obtained from the base of the skull through the vertex without intravenous contrast. COMPARISON:  None. FINDINGS: Brain: No evidence of acute infarction, hemorrhage, hydrocephalus, extra-axial collection or mass lesion/mass effect. Vascular: No hyperdense vessel or unexpected calcification. Skull: Normal. Negative for fracture or focal lesion. Sinuses/Orbits: Mild partial opacification of the right frontal sinus and bilateral sphenoid sinuses. Mastoid air cells are clear. Other: Small extracranial hematoma overlying the left frontal bone (series 2/image 11). IMPRESSION: Small extracranial hematoma overlying the left frontal bone. No evidence of acute intracranial abnormality. Electronically Signed   By: Julian Hy M.D.   On: 05/30/2018 10:56   Ct Abdomen Pelvis W Contrast  Result Date: 05/30/2018 CLINICAL DATA:  Elevated liver function tests. Adult failure to thrive. EXAM: CT ABDOMEN AND PELVIS WITH CONTRAST TECHNIQUE: Multidetector CT imaging of the abdomen and pelvis was performed using the standard protocol following bolus administration of intravenous contrast. CONTRAST:  164m ISOVUE-300 IOPAMIDOL (ISOVUE-300) INJECTION 61% COMPARISON:  CT scan of April 04, 2010. FINDINGS: Lower chest: No acute abnormality. Hepatobiliary: No focal liver abnormality is seen. No gallstones, gallbladder wall thickening, or biliary dilatation. Pancreas: Unremarkable. No pancreatic ductal dilatation or surrounding inflammatory changes. Spleen: Normal in size without focal abnormality. Adrenals/Urinary Tract: Adrenal glands are unremarkable. Kidneys are normal, without renal calculi, focal lesion, or hydronephrosis. Bladder is unremarkable. Stomach/Bowel: The stomach appears normal. There is no evidence of bowel obstruction or inflammation. The appendix is not visualized. Vascular/Lymphatic: Mildly enlarged mesenteric and retroperitoneal adenopathy is noted, all of which are less than 1 cm in size, and most likely are inflammatory in etiology. Reproductive: Multiple small uterine fibroids are noted. No adnexal abnormality is noted. Other: No abdominal wall hernia or abnormality. No abdominopelvic ascites. Musculoskeletal: No acute or significant osseous findings. IMPRESSION: Mildly enlarged mesenteric and retroperitoneal adenopathy is now noted, most likely inflammatory or reactive in etiology. Multiple small uterine fibroids are noted. Electronically Signed   By: JMarijo Conception M.D.   On: 05/30/2018 14:26   Mr Abdomen Mrcp Wo Contrast  Result Date: 06/01/2018 CLINICAL DATA:  Elevated liver function tests. Indeterminate lesion identified on ultrasound. MRI recommended. EXAM: MRI ABDOMEN WITHOUT CONTRAST  (INCLUDING MRCP) TECHNIQUE: Multiplanar multisequence MR imaging of the abdomen was performed. Heavily T2-weighted images of the biliary and pancreatic ducts were obtained, and three-dimensional MRCP images were rendered by post processing. COMPARISON:  Ultrasound 05/31/2018, CT 102725FINDINGS: Patient unable to complete the entire exam due to pain. Lower chest:  Bilateral small effusions and bibasilar atelectasis. Hepatobiliary: No focal hepatic lesion identified. No lesion  corresponds to the hypodense lesion on comparison CT. Suspect lesion may represent focal fatty sparing. Opposed phase imaging was not performed due to patient discomfort. There is no intrahepatic or extrahepatic biliary duct dilatation. Pancreas: Normal pancreatic parenchymal intensity. No ductal dilatation or inflammation. Spleen: Spleen is  mildly enlarged Adrenals/urinary tract: Adrenal glands and kidneys are normal. Stomach/Bowel: Stomach and limited of the small bowel is unremarkable Vascular/Lymphatic: Abdominal aortic normal caliber. No retroperitoneal periportal lymphadenopathy. Musculoskeletal: No aggressive osseous lesion IMPRESSION: 1. No hepatic lesion identified. All sequences could not be completed due to patient termination of exam due to pain. Opposed phase imaging not performed or postcontrast imaging. Lesion not identified on the provided sequences. Lesion may represent focal fatty sparing. As lesion is occult on CT and MRI not tolerated in this patient patient (at least currently), recommend follow-up ultrasound to demonstrate stability in 3-6 months. 2. Bibasilar atelectasis and effusions. 3. No biliary duct dilatation gallbladder sludge Electronically Signed   By: Suzy Bouchard M.D.   On: 06/01/2018 15:18   Mr 3d Recon At Scanner  Result Date: 06/01/2018 CLINICAL DATA:  Elevated liver function tests. Indeterminate lesion identified on ultrasound. MRI recommended. EXAM: MRI ABDOMEN WITHOUT CONTRAST  (INCLUDING MRCP) TECHNIQUE: Multiplanar multisequence MR imaging of the abdomen was performed. Heavily T2-weighted images of the biliary and pancreatic ducts were obtained, and three-dimensional MRCP images were rendered by post processing. COMPARISON:  Ultrasound 05/31/2018, [place] [address] FINDINGS: Patient unable to complete the entire exam due to pain. Lower chest:  Bilateral small effusions and bibasilar atelectasis. Hepatobiliary: No focal hepatic lesion identified. No lesion corresponds to the  hypodense lesion on comparison CT. Suspect lesion may represent focal fatty sparing. Opposed phase imaging was not performed due to patient discomfort. There is no intrahepatic or extrahepatic biliary duct dilatation. Pancreas: Normal pancreatic parenchymal intensity. No ductal dilatation or inflammation. Spleen: Spleen is mildly enlarged Adrenals/urinary tract: Adrenal glands and kidneys are normal. Stomach/Bowel: Stomach and limited of the small bowel is unremarkable Vascular/Lymphatic: Abdominal aortic normal caliber. No retroperitoneal periportal lymphadenopathy. Musculoskeletal: No aggressive osseous lesion IMPRESSION: 1. No hepatic lesion identified. All sequences could not be completed due to patient termination of exam due to pain. Opposed phase imaging not performed or postcontrast imaging. Lesion not identified on the provided sequences. Lesion may represent focal fatty sparing. As lesion is occult on CT and MRI not tolerated in this patient patient (at least currently), recommend follow-up ultrasound to demonstrate stability in 3-6 months. 2. Bibasilar atelectasis and effusions. 3. No biliary duct dilatation gallbladder sludge Electronically Signed   By: Suzy Bouchard M.D.   On: 06/01/2018 15:18   Ct Biopsy  Result Date: 06/05/2018 INDICATION: 52 year old with pancytopenia.  Request for bone marrow biopsy. EXAM: CT GUIDED BONE MARROW ASPIRATES AND BIOPSY Physician: Stephan Minister. Anselm Pancoast, MD MEDICATIONS: None. ANESTHESIA/SEDATION: Fentanyl 100 mcg IV; Versed 2.0 mg IV Moderate Sedation Time:  19 minutes The patient was continuously monitored during the procedure by the interventional radiology nurse under my direct supervision. COMPLICATIONS: None immediate. PROCEDURE: The procedure was explained to the patient. The risks and benefits of the procedure were discussed and the patient's questions were addressed. Informed consent was obtained from the patient. The patient was placed prone on CT table. Images of  the pelvis were obtained. The right side of back was prepped and draped in sterile fashion. Maximal barrier sterile technique was utilized including caps, mask, sterile gowns, sterile gloves, sterile drape, hand hygiene and skin antiseptic. The skin and right posterior ilium were anesthetized with 1% lidocaine. 11 gauge bone needle was directed into the right ilium with CT guidance. Two aspirates and two core biopsies were obtained. Bandage placed over the puncture site. IMPRESSION: CT guided bone marrow aspiration and core biopsy. Electronically Signed   By: Quita Skye  Anselm Pancoast M.D.   On: 06/05/2018 12:50   Dg Chest Port 1 View  Result Date: 05/31/2018 CLINICAL DATA:  Fever EXAM: PORTABLE CHEST 1 VIEW COMPARISON:  05/30/2018 chest radiograph FINDINGS: This is a mildly low volume film. The cardiomediastinal silhouette is unremarkable. There is no evidence of focal airspace disease, pulmonary edema, suspicious pulmonary nodule/mass, pleural effusion, or pneumothorax. No acute bony abnormalities are identified. IMPRESSION: Low volume film without acute abnormality. Electronically Signed   By: Margarette Canada M.D.   On: 05/31/2018 15:16   Ct Bone Marrow Biopsy & Aspiration  Result Date: 06/05/2018 INDICATION: 52 year old with pancytopenia.  Request for bone marrow biopsy. EXAM: CT GUIDED BONE MARROW ASPIRATES AND BIOPSY Physician: Stephan Minister. Anselm Pancoast, MD MEDICATIONS: None. ANESTHESIA/SEDATION: Fentanyl 100 mcg IV; Versed 2.0 mg IV Moderate Sedation Time:  19 minutes The patient was continuously monitored during the procedure by the interventional radiology nurse under my direct supervision. COMPLICATIONS: None immediate. PROCEDURE: The procedure was explained to the patient. The risks and benefits of the procedure were discussed and the patient's questions were addressed. Informed consent was obtained from the patient. The patient was placed prone on CT table. Images of the pelvis were obtained. The right side of back was  prepped and draped in sterile fashion. Maximal barrier sterile technique was utilized including caps, mask, sterile gowns, sterile gloves, sterile drape, hand hygiene and skin antiseptic. The skin and right posterior ilium were anesthetized with 1% lidocaine. 11 gauge bone needle was directed into the right ilium with CT guidance. Two aspirates and two core biopsies were obtained. Bandage placed over the puncture site. IMPRESSION: CT guided bone marrow aspiration and core biopsy. Electronically Signed   By: Markus Daft M.D.   On: 06/05/2018 12:50   US Abdomen Limited Ruq  Result Date: 05/31/2018 CLINICAL DATA:  Abnormal liver function tests. EXAM: ULTRASOUND ABDOMEN LIMITED RIGHT UPPER QUADRANT COMPARISON:  CT scan of May 30, 2018. FINDINGS: Gallbladder: No gallstones or wall thickening visualized. No sonographic Murphy sign noted by sonographer. Mild amount of sludge is seen within gallbladder lumen. Common bile duct: Diameter: 3 mm which is within normal limits. Liver: 2.3 cm hypoechoic solid abnormality is seen in the right hepatic lobe which is not visualized on prior CT scan. Coarse echotexture of hepatic parenchyma is noted suggesting diffuse hepatocellular disease. Hypoechoic areas are noted around the portal triads suggesting edema potentially related to hepatic inflammation or adenopathy at porta hepatis region. Minimal ascites is noted around the liver. Portal vein is patent on color Doppler imaging with normal direction of blood flow towards the liver. IMPRESSION: Coarse echotexture of hepatic parenchyma is noted suggesting diffuse hepatocellular disease such as possible hepatitis. Hypoechoic areas are noted around the portal triads suggesting edema potentially related to hepatic inflammation, or lymphatic obstruction secondary to adenopathy at the porta hepatis region. Minimal ascites is noted around the liver. 2.3 cm rounded hypoechoic solid abnormality is seen in the right hepatic lobe which  is not seen on prior CT scan. Further evaluation with MRI is recommended when patient can hold still to evaluate for possible neoplasm. Mild amount of sludge seen within gallbladder lumen. Electronically Signed   By: Marijo Conception, M.D.   On: 05/31/2018 12:42    Assessment: 52 y.o. [place] woman admitted 05/30/2018 following a fall, found to  have progressive cytopenias; other problems include fever with cultures negative to date, transaminitis with liver US suggesting a focal lesion but MRCP read as c/w focal fat sparing, history of bipolar  disorder with recent admission for suicidal ideation  (1) SLE?-- multiple positive autoimmune titers; now on plaquenil and prednisone with counts improving  (2) bone marrow biopsy final report pending but preliminary shows no leukemia or lymphoma, no evidence of cancer, no obvious dysplasia; mild changes appear reactive   Plan:  Ms Hillary' bone marrow biopsy is c/w with her working diagnosis of autoimmune disease, possible SLE. Her counts are improving on plaquenil/prednisone and note a WBC count of 3.5 is normal lower limit for African Americans.  Liver biopsy may further clarify diagnosis but very likely the liver changes are due to the same systemic autoimmune process and I would not be uncomfortable treating the patient for SLE as you are doing and following.  She will need coordination of care with a rheumatologist who can follow her as outpatient--this may be difficult to achieve. In my experience patients with significant psychiatric histories and in the absence of concerned family tend to not show and their appointments don't get rescheduled. The patient's group home or SNF caregivers will have to be advised of the importance of outpatient rheumatologic follow-up.  At this point I will sign out. Please let me know if I can be of further help.    Chauncey Cruel, MD 06/07/2018  7:43 AM Medical Oncology and Hematology [facility] [address] [place], [place] [address] Tel. [phone]    Fax. [phone]

## 2018-06-07 NOTE — Progress Notes (Signed)
PROGRESS NOTE    Yvonne [name]  TXM:[id number] DOB: 1966/04/13 DOA: 05/30/2018 PCP: Leonard Downing, MD   Brief Narrative:  HPI on 05/30/2018 by Dr. Gevena Barre Yvonne Jonesis a 52 y.o.femalewith medical history significant for bipolar disorder who resides in a group home and presented to the emergency room after having a fall following lightheadedness. In the emergency room, noted to have significant orthostatic hypotension.White count normal, although patient noted to have some mild transaminitis. Mildly elevated lipase level although she does not report any abdominal pain. Initially no evidence of fever. Chest x-ray unremarkable. Abdominal CT also unrevealing. Hospitalist were called for further evaluation.  Following admission to the floor, patient spiked a fever of 101.4, remained tachycardic. Lactic acid and procalcitonin level ordered.  **Was admitted for orthostatic hypotension and found to have sepsis secondary to an unclear etiology.  She spiked temperature as well as became tachycardic, tachypnea with leukopenia.  Work-up thus far have returned no source.  She was noted to have positive autoimmune work-up and started on very low-dose prednisone and increased to 1 mg/kg.  Hematology consulted for leukopenia and thrombocytopenia.  Bone marrow biopsy done 06/05/2018. GI also consulted for possible autoimmune hepatitis. GI pursing Liver Biopsy and likely to be done 06/09/18. PT still recommending SNF  Assessment & Plan:   Principal Problem:   Orthostatic hypotension Active Problems:   Bipolar I disorder, current or most recent episode manic, with psychotic features (HCC)   Severe protein-calorie malnutrition (HCC)   Hypocalcemia   Transaminitis   Fever   Thrombocytopenia (HCC)  Sepsis secondary to unclear etiology -Patient did have a temperature of 101.8, tachycardia, tachypnea and leukopenia on admission -Sepsis physiology does appear to have  improved -Patient was empirically started on vancomycin and cefepime, and has received 5 days of antibiotics, and discontinued.  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  Was placed on CIWA protocol empirically -Right upper quadrant ultrasound showed coarse echotexture of the hepatic parenchyma suggesting diffuse hepatocellular disease, possible hepatitis.  Alcoholic areas around the portal triads suggesting edema, potentially related obstruction secondary adenopathy at the porta hepatis region.  May need MRI for further evaluation for possible neoplasm. -UDS, alcohol level and salicylate level unremarkable on admission -Hepatitis panel unremarkable -MRCP did not show any hepatic lesion.  No biliary duct dilatation or gallbladder sludge. -Gastroenterology consulted and appreciated, may need liver biopsy and this is getting planned for 06/09/18 as GI feels as if she needs it priort to initiation of long term steroids and because we do not know the exact etiology of her Abnormal LFT's  -Antimicrosomal antibody 0.8 -Continue to monitor CMP  Orthostatic hypotension -In the setting of dehydration from poor oral intake as well as sepsis -Patient given 2 L normal saline along with IV fluid continuous -PT OT recommended SNF  Pancytopenia, improved a little  -Patient with from Thrombocytopenia, anemia and leukopenia -hemoglobin stable and platelets improving and now 108 -Hematology oncology consulted and appreciated -Bone marrow biopsy planned for 06/05/2018 -Patient has been transfused 1 unit PRBC -Anemia panel showed iron 33, U IBC 82, TIBC 115, ferritin  5643, folate 9.3, vitamin B12 270 -Haptoglobin 27, HIT 0.713 -Pending Serotonin Release Assay  given HIT  -Continue to monitor CBC  Suspected autoimmune disease  -Possibly lupus, Sjogren's, autoimmune hepatitis -LDH 1063, CRP 1.1, ESR 65 -ANA positive, double-stranded DNA 135, ENA SM antibody greater than 8, chromatin antibody greater than 8, ribonucleic protein 7.6, SSA antibody IgG greater than 8, SSB IgG G antibiogram than 8 -Concern for autoimmune disorders -F-actin IgG was 37 -Patient started on hydroxychloroquine 15m twice daily, prednisone 1 mg/kg daily -Will be obtaining a Liver Biopsy -Bone Marrow Biposy showed no Leukemia, Lymphoma or evidence of Cancer. There was no obvious Dysplasia. Mild changes -Patient will need to follow-up with rheumatology as an outpatient  Hypophosphatemia/ Hypomagnesemia/ Hypokalemia/ Hypocalcemia -improved, continue to monitor -corrected calcium 9.7 (WNL)  Bipolar 1 disorder versus other underlying psychiatric disorder -Does not appear to be manic at this time -Continue Aripiprazole, Escitalopram, Hydroxazine  Underweight -Continue Ensure -Nutrition consulted  DVT Prophylaxis: SCDs Code Status: FULL CODE Family Communication: No family present at bedside Disposition Plan: SNF when medically stable  Consultants:   Gastroenterology  Interventional Radiology  Hematology/Oncology   Was discussed with rheumatology via telephone to Dr. GLenetta QuakerAryl   Procedures:  RUQ [place]MRCP CT guided bone marrow biopsy  Liver Biopsy (scheduled for 06/09/18)  Antimicrobials: Anti-infectives (From admission, onward)   Start     Dose/Rate Route Frequency Ordered Stop   06/03/18 1000  hydroxychloroquine (PLAQUENIL) tablet 200 mg     200 mg Oral 2 times daily 06/03/18 0812     06/01/18 1730  vancomycin (VANCOCIN) IVPB 750 mg/150 ml premix  Status:  Discontinued     750 mg 150 mL/hr over 60 Minutes Intravenous Every 24 hours 05/31/18 1629  06/04/18 1125   05/31/18 1700  ceFEPIme (MAXIPIME) 1 g in sodium chloride 0.9 % 100 mL IVPB  Status:  Discontinued     1 g 200 mL/hr over 30 Minutes Intravenous Every 12 hours 05/31/18 1616 06/04/18 1125   05/31/18 1630  vancomycin (VANCOCIN) IVPB 1000 mg/200 mL premix     1,000 mg 200 mL/hr over 60 Minutes Intravenous  Once 05/31/18 1616 05/31/18 1810     Subjective: Seen them at bedside was still pretty weak.  Denies chest pain, lightheadedness or dizziness.  Dr. MJana Hakimdiscussed the bone marrow biopsy results with her and she was understanding and is agreeable to getting a liver biopsy recommended by gastroneurology.  No other concerns or complaints at this time  Objective: Vitals:   06/06/18 1348 06/06/18 2013 06/07/18 0553 06/07/18 1329  BP:  117/75 124/74 120/81  Pulse: 99 79 71 75  Resp:  '19 16 18  ' Temp:  98 F (36.7 C) 98.1 F (36.7 C) 98.4 F (36.9 C)  TempSrc:      SpO2:  100% 98% 100%  Weight:      Height:        Intake/Output Summary (Last 24 hours) at 06/07/2018 1918 Last data filed at 06/07/2018 1700 Gross per 24 hour  Intake 1559.54 ml  Output 1150 ml  Net 409.54 ml   Filed Weights   05/30/18 0854 06/06/18 0956  Weight: 46.3 kg 46.3 kg   Examination: Physical Exam:  Constitutional: Thin chronically ill appearing AAF in NAD and appears calm and comfortable Eyes: Lids and conjunctivae normal, sclerae anicteric  HENMT: External Ears, Nose appear normal. Grossly normal hearing. Mucous membranes are moist. Has a hematoma on Left side of forehead from fall Neck: Appears normal, supple, no cervical masses, normal ROM, no appreciable thyromegaly; no JVD Respiratory: Clear to auscultation bilaterally,  no wheezing, rales, rhonchi or crackles. Normal respiratory effort and patient is not tachypenic. No accessory muscle use.  Cardiovascular: RRR, no murmurs / rubs / gallops. S1 and S2 auscultated. No extremity edema.  Abdomen: Soft, non-tender, non-distended. No  masses palpated. No appreciable hepatosplenomegaly. Bowel sounds positive x4.  GU: Deferred. Musculoskeletal: No clubbing / cyanosis of digits/nails. Skin: No rashes, lesions, ulcers on a limited skin eval. No induration; Warm and dry.  Neurologic: CN 2-12 grossly intact with no focal deficits.  Romberg sign and cerebellar reflexes not assessed.  Psychiatric: Normal judgment and insight. Alert and oriented x 3. Anxious and paranoid mood and flat affect.   Data Reviewed: I have personally reviewed following labs and imaging studies  CBC: Recent Labs  Lab 06/01/18 0627 06/02/18 0611 06/03/18 0525 06/04/18 0534 06/05/18 0553 06/06/18 0548 06/07/18 0620  WBC 2.2* 1.9* 1.9* 2.3* 3.5* 2.7* 3.2*  NEUTROABS 1.3* 0.9* 0.8* 0.9* 1.3*  --   --   HGB 7.1* 9.1* 9.4* 9.0* 9.7* 8.9* 8.7*  HCT 23.4* 28.1* 29.1* 29.1* 31.0* 28.8* 28.2*  MCV 89.7 87.8 89.5 89.0 90.6 90.9 91.6  PLT 52* 43* 49* 60* 90* 102* 709*   Basic Metabolic Panel: Recent Labs  Lab 06/01/18 0627 06/02/18 0611 06/03/18 0525 06/04/18 0534 06/05/18 0553 06/06/18 0548 06/07/18 0620  NA 139 139 137 140 144 144 146*  K 3.6 3.4* 4.0 4.1 3.9 4.0 3.5  CL 113* 113* 110 113* 113* 113* 111  CO2 21* '23 23 23 28 28 30  ' GLUCOSE 93 88 89 95 94 82 90  BUN '7 6 6 8 11 12 11  ' CREATININE 0.43* 0.48 0.41* 0.38* 0.45 0.50 0.42*  CALCIUM 7.0* 6.7* 7.1* 7.4* 7.9* 7.8* 8.0*  MG 1.6* 1.9 1.9 1.9  --   --   --   PHOS 2.3* 2.9 2.2* 2.5  --   --   --    GFR: Estimated Creatinine Clearance: 59.4 mL/min (A) (by C-G formula based on SCr of 0.42 mg/dL (L)). Liver Function Tests: Recent Labs  Lab 06/03/18 0525 06/04/18 0534 06/05/18 0553 06/06/18 0548 06/07/18 0620  AST 336* 311* 351* 350* 354*  ALT 50* 46* 60* 59* 69*  ALKPHOS 378* 364* 406* 357* 422*  BILITOT 0.8 0.5 0.6 0.3 0.4  PROT 5.7* 5.6* 6.5 6.1* 6.3*  ALBUMIN 1.6* 1.5* 1.8* 1.7* 1.8*   No results for input(s): LIPASE, AMYLASE in the last 168 hours. No results for input(s):  AMMONIA in the last 168 hours. Coagulation Profile: Recent Labs  Lab 06/01/18 1344  INR 0.90   Cardiac Enzymes: Recent Labs  Lab 06/05/18 0553  CKTOTAL 165   BNP (last 3 results) No results for input(s): PROBNP in the last 8760 hours. HbA1C: No results for input(s): HGBA1C in the last 72 hours. CBG: No results for input(s): GLUCAP in the last 168 hours. Lipid Profile: Recent Labs    06/05/18 0553  TRIG 230*   Thyroid Function Tests: No results for input(s): TSH, T4TOTAL, FREET4, T3FREE, THYROIDAB in the last 72 hours. Anemia Panel: No results for input(s): VITAMINB12, FOLATE, FERRITIN, TIBC, IRON, RETICCTPCT in the last 72 hours. Sepsis Labs: Recent Labs  Lab 06/01/18 0627 06/02/18 0611  PROCALCITON 0.38 0.33    Recent Results (from the past 240 hour(s))  Culture, blood (routine x 2)     Status: None   Collection Time: 05/31/18  7:45 AM  Result Value Ref Range Status   Specimen Description   Final    BLOOD  RIGHT ANTECUBITAL Performed at [facility] [address] [place], [place] [address]    Special Requests   Final    BOTTLES DRAWN AEROBIC AND ANAEROBIC Blood Culture adequate volume Performed at [facility] [address] [place], [place] [address]    Culture   Final    NO GROWTH 5 DAYS Performed at [facility], [place] [address], [place], [place] [address]    Report Status 06/05/2018 FINAL  Final  Culture, blood (routine x 2)     Status: None   Collection Time: 05/31/18  7:49 AM  Result Value Ref Range Status   Specimen Description   Final    BLOOD RIGHT ARM Performed at [facility] [address], [place], [place] [address]    Special Requests   Final    BOTTLES DRAWN AEROBIC AND ANAEROBIC Blood Culture adequate volume Performed at [facility] [address] [place], [place] [address]    Culture   Final    NO GROWTH 5 DAYS Performed at [facility], [place] [address], [place], [place] [address]    Report Status 06/05/2018 FINAL  Final  Culture, Urine     Status: Abnormal   Collection Time: 05/31/18  4:18 PM  Result Value Ref Range Status   Specimen Description   Final    URINE, RANDOM Performed at [facility] [address], [place], [place] [address]    Special Requests   Final    NONE Performed at [facility], [place] [address] [place], [place] [address]    Culture (A)  Final    60,000 COLONIES/mL MULTIPLE SPECIES PRESENT, SUGGEST RECOLLECTION   Report Status 06/02/2018 FINAL  Final     RN Pressure Injury Documentation: Pressure Injury 06/01/18 (Active)  06/01/18 1908   Location: Sacrum  Location Orientation:   Staging:   Wound Description (Comments):   Present on Admission:    Radiology Studies: No results found.  Scheduled Meds: . ARIPiprazole  15 mg Oral Daily  . escitalopram  5 mg Oral Daily  . feeding supplement (ENSURE ENLIVE)  237 mL Oral BID BM  . folic acid  1 mg Oral Daily  . hydroxychloroquine  200 mg Oral BID  . metoprolol tartrate  25 mg Oral BID  . multivitamin with minerals  1 tablet Oral Daily  . predniSONE  50 mg Oral Q breakfast  . thiamine  100 mg Oral Daily   Or  . thiamine  100 mg Intravenous Daily   Continuous Infusions: . dextrose 5 % and 0.9% NaCl 50 mL/hr at 06/07/18 1500     LOS: 8 days   Kerney Elbe, DO [facility] PAGER is on [place]  If 7PM-7AM, please contact night-coverage [email or web address] Password [facility] 06/07/2018, 7:18 PM

## 2018-06-08 LAB — CBC WITH DIFFERENTIAL/PLATELET
Abs Immature Granulocytes: 0.18 10*3/uL — ABNORMAL HIGH (ref 0.00–0.07)
BASOS PCT: 0 %
Basophils Absolute: 0 10*3/uL (ref 0.0–0.1)
EOS ABS: 0 10*3/uL (ref 0.0–0.5)
Eosinophils Relative: 0 %
HEMATOCRIT: 27.6 % — AB (ref 36.0–46.0)
Hemoglobin: 8.5 g/dL — ABNORMAL LOW (ref 12.0–15.0)
Immature Granulocytes: 4 %
Lymphocytes Relative: 33 %
Lymphs Abs: 1.4 10*3/uL (ref 0.7–4.0)
MCH: 28.7 pg (ref 26.0–34.0)
MCHC: 30.8 g/dL (ref 30.0–36.0)
MCV: 93.2 fL (ref 80.0–100.0)
MONOS PCT: 7 %
Monocytes Absolute: 0.3 10*3/uL (ref 0.1–1.0)
NEUTROS PCT: 56 %
NRBC: 1.9 % — AB (ref 0.0–0.2)
Neutro Abs: 2.3 10*3/uL (ref 1.7–7.7)
Platelets: 139 10*3/uL — ABNORMAL LOW (ref 150–400)
RBC: 2.96 MIL/uL — ABNORMAL LOW (ref 3.87–5.11)
RDW: 16.7 % — AB (ref 11.5–15.5)
WBC: 4.2 10*3/uL (ref 4.0–10.5)

## 2018-06-08 LAB — COMPREHENSIVE METABOLIC PANEL
ALBUMIN: 1.9 g/dL — AB (ref 3.5–5.0)
ALT: 88 U/L — ABNORMAL HIGH (ref 0–44)
ANION GAP: 4 — AB (ref 5–15)
AST: 382 U/L — ABNORMAL HIGH (ref 15–41)
Alkaline Phosphatase: 405 U/L — ABNORMAL HIGH (ref 38–126)
BILIRUBIN TOTAL: 0.6 mg/dL (ref 0.3–1.2)
BUN: 9 mg/dL (ref 6–20)
CO2: 28 mmol/L (ref 22–32)
Calcium: 8 mg/dL — ABNORMAL LOW (ref 8.9–10.3)
Chloride: 111 mmol/L (ref 98–111)
Creatinine, Ser: 0.49 mg/dL (ref 0.44–1.00)
GFR calc Af Amer: >60 mL/min (ref >60)
GLUCOSE: 83 mg/dL (ref 70–99)
POTASSIUM: 3.4 mmol/L — AB (ref 3.5–5.1)
Sodium: 143 mmol/L (ref 135–145)
TOTAL PROTEIN: 6.4 g/dL — AB (ref 6.5–8.1)

## 2018-06-08 LAB — PHOSPHORUS: PHOSPHORUS: 2.6 mg/dL (ref 2.5–4.6)

## 2018-06-08 LAB — MAGNESIUM: MAGNESIUM: 2 mg/dL (ref 1.7–2.4)

## 2018-06-08 MED ORDER — POTASSIUM CHLORIDE CRYS ER 20 MEQ PO TBCR
40.0000 meq | EXTENDED_RELEASE_TABLET | Freq: Two times a day (BID) | ORAL | Status: AC
  Administered 2018-06-08 (×2): 40 meq via ORAL
  Filled 2018-06-08 (×2): qty 2

## 2018-06-08 NOTE — Progress Notes (Signed)
PROGRESS NOTE    Yvonne [name]  YPP:[id number] DOB: [birth year] DOA: 05/30/2018 PCP: Leonard Downing, MD   Brief Narrative:  HPI on 05/30/2018 by Dr. Gevena Barre Yvonne Jonesis a 52 y.o.femalewith medical history significant for bipolar disorder who resides in a group home and presented to the emergency room after having a fall following lightheadedness. In the emergency room, noted to have significant orthostatic hypotension.White count normal, although patient noted to have some mild transaminitis. Mildly elevated lipase level although she does not report any abdominal pain. Initially no evidence of fever. Chest x-ray unremarkable. Abdominal CT also unrevealing. Hospitalist were called for further evaluation.  Following admission to the floor, patient spiked a fever of 101.4, remained tachycardic. Lactic acid and procalcitonin level ordered.  **Was admitted for orthostatic hypotension and found to have sepsis secondary to an unclear etiology.  She spiked temperature as well as became tachycardic, tachypnea with leukopenia.  Work-up thus far have returned no source of infection.  She was noted to have positive autoimmune work-up and started on very low-dose prednisone and increased to 1 mg/kg.  Hematology consulted for leukopenia and thrombocytopenia which is now improving.  Bone marrow biopsy done 06/05/2018. GI also consulted for possible autoimmune hepatitis given Abnormal and Elevated LFTs. GI pursing Liver Biopsy and likely to be done 06/09/18. PT still recommending SNF  Assessment & Plan:   Principal Problem:   Orthostatic hypotension Active Problems:   Bipolar I disorder, current or most recent episode manic, with psychotic features (HCC)   Severe protein-calorie malnutrition (HCC)   Hypocalcemia   Transaminitis   Fever   Thrombocytopenia (HCC)  Sepsis secondary to unclear etiology -Patient did have a temperature of 101.8, tachycardia, tachypnea and  leukopenia on admission -Sepsis physiology does appear to have improved -Patient was empirically started on vancomycin and cefepime, and has received 5 days of antibiotics, and discontinued.  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  Was placed on CIWA protocol empirically -Right upper quadrant ultrasound showed coarse echotexture of the hepatic parenchyma suggesting diffuse hepatocellular disease, possible hepatitis.  Alcoholic areas around the portal triads suggesting edema, potentially related obstruction secondary adenopathy at the porta hepatis region.  May need MRI for further evaluation for possible neoplasm. -UDS, alcohol level and salicylate level unremarkable on admission -Hepatitis panel unremarkable -MRCP did not show any hepatic lesion.  No biliary duct dilatation or gallbladder sludge. -Gastroenterology consulted and appreciated, may need liver biopsy and this is getting planned for 06/09/18 as GI feels as if she needs it priort to initiation of long term steroids and because we do not know the exact etiology of her Abnormal LFT's  -Antimicrosomal antibody 0.8 -AST is now 382 and ALT is now 49 -Continue to monitor CMP -Patient to get Liver Biopsy in AM  Orthostatic hypotension -In the setting of dehydration from poor oral intake as well as sepsis -Patient given 2 L normal saline along with IV fluid continuous which have now stopped -PT OT recommended SNF  Pancytopenia, improved a little  -Patient with from Thrombocytopenia, anemia and leukopenia;  -Counts have improved and WBC is now normal at  4.2 -hemoglobin stable and  is now 8.5 and platelets improving and now 139 -Hematology oncology consulted and appreciated -Bone marrow biopsy planned for 06/05/2018 -Patient has been transfused 1 unit PRBC -Anemia panel showed iron 33, U IBC 82, TIBC 115, ferritin 5643, folate 9.3, vitamin B12 270 -Haptoglobin 27, HIT 0.713 -Pending Serotonin Release Assay given HIT  -Continue to monitor CBC  Suspected autoimmune disease  -Possibly lupus, Sjogren's, autoimmune hepatitis -LDH 1063, CRP 1.1, ESR 65 -ANA positive, double-stranded DNA 135, ENA SM antibody greater than 8, chromatin antibody greater than 8, ribonucleic protein 7.6, SSA antibody IgG greater than 8, SSB IgG G antibiogram than 8 -Mitochondrial M2 Ab, IgG <20.0 and LKM1 Ab 0.8 -Concern for autoimmune disorders -F-actin IgG was 37 -Patient started on hydroxychloroquine 61m twice daily, prednisone 1 mg/kg daily -Will be obtaining a Liver Biopsy and to be done 06/09/18 -Bone Marrow Biposy showed no Leukemia, Lymphoma or evidence of Cancer. There was no obvious Dysplasia. Mild changes -Patient will need to follow-up with rheumatology as an outpatient  Hypophosphatemia/ Hypomagnesemia/ Hypocalcemia -improved, continue to monitor -Corrected Calcium WNL  Hypokalemia -Patient's potassium this morning was 3.4 -Replete with p.o. potassium chloride 40 mg twice daily x2 doses -Continue monitor replete as necessary -Repeat CMP in a.m.  Bipolar 1 disorder versus other underlying psychiatric disorder -Does not appear to be manic at this time -Continue Aripiprazole, Escitalopram, Hydroxazine  Underweight -Continue Ensure -Nutrition consulted  DVT Prophylaxis: SCDs Code Status: FULL CODE Family Communication: No family present at bedside Disposition Plan: SNF when medically stable  Consultants:   Gastroenterology  Interventional Radiology  Hematology/Oncology   Was discussed with rheumatology via telephone to Dr.  GLenetta QuakerAryl   Procedures:  RUQ UK[place]MRCP CT guided bone marrow biopsy  Liver Biopsy (scheduled for 06/09/18)  Antimicrobials: Anti-infectives (From admission, onward)   Start     Dose/Rate Route Frequency Ordered Stop   06/03/18 1000  hydroxychloroquine (PLAQUENIL) tablet 200 mg     200 mg Oral 2 times daily 06/03/18 0812     06/01/18 1730  vancomycin (VANCOCIN) IVPB 750 mg/150 ml premix  Status:  Discontinued     750 mg 150 mL/hr over 60 Minutes Intravenous Every 24 hours 05/31/18 1629 06/04/18 1125   05/31/18 1700  ceFEPIme (MAXIPIME) 1 g in sodium chloride 0.9 % 100 mL IVPB  Status:  Discontinued     1 g 200 mL/hr over 30 Minutes Intravenous Every 12 hours 05/31/18 1616 06/04/18 1125   05/31/18 1630  vancomycin (VANCOCIN) IVPB 1000 mg/200 mL premix     1,000 mg 200 mL/hr over 60 Minutes Intravenous  Once 05/31/18 1616 05/31/18 1810     Subjective: Seen and examined at bedside and states she was doing ok and felt a little better.  No chest pain, lightheadedness or dizziness.  No nausea or vomiting.  Feels okay and states she is a little stronger.  No other concerns or complaints at this time is ready for liver biopsy tomorrow.  Objective: Vitals:   06/07/18 0553 06/07/18 1329 06/07/18 2105 06/08/18 0432  BP: 124/74 120/81 117/79 124/76  Pulse: 71 75 73 71  Resp: _0 Temp: 98.1 F (36.7 C) 98.4 F (36.9 C) 98.9 F (37.2 C) 98.3 F (36.8 C)  TempSrc:    Oral  SpO2: 98% 100% 98% 98%  Weight:      Height:        Intake/Output Summary (Last 24 hours) at 06/08/2018 1236 Last data filed at 06/08/2018 1058 Gross per 24  hour  Intake 459.97 ml  Output 1200 ml  Net -740.03 ml   Filed Weights   05/30/18 0854 06/06/18 0956  Weight: 46.3 kg 46.3 kg   Examination: Physical Exam:  Constitutional: Extremely thin, chronically ill-appearing African-American female who is currently in no acute distress appears calm and appears comfortable Eyes: Lids and conjunctive  are normal.  Sclera anicteric. HENMT: External ears and nose appear normal.  Grossly normal hearing.  Mucous members are moist.  Has a hematoma on the left side of her forehead from fall which is improving Neck: Appears supple with no JVD Respiratory: Clear to auscultation bilaterally no appreciable wheezing, rales, rhonchi.  Patient not tachypneic wheezing excess muscle breathe Cardiovascular: Regular rate and rhythm.  No appreciable murmurs, rubs, gallops.  No lower extremity edema Abdomen: Soft, nontender, nondistended.  Bowel sounds present all 4 quadrants GU: Deferred Musculoskeletal: No contractures or cyanosis.  No joint deformities noted Skin: Skin is warm and dry no appreciable rashes or lesions on a limited skin evaluation Neurologic: Cranial nerves II through XII grossly intact no appreciable focal deficits Psychiatric: Normal judgment and insight.  Alert and oriented x3.  Slightly anxious and paranoid mood.  Has a flat affect  Data Reviewed: I have personally reviewed following labs and imaging studies  CBC: Recent Labs  Lab 06/02/18 0611 06/03/18 0525 06/04/18 0534 06/05/18 0553 06/06/18 0548 06/07/18 0620 06/08/18 0607  WBC 1.9* 1.9* 2.3* 3.5* 2.7* 3.2* 4.2  NEUTROABS 0.9* 0.8* 0.9* 1.3*  --   --  2.3  HGB 9.1* 9.4* 9.0* 9.7* 8.9* 8.7* 8.5*  HCT 28.1* 29.1* 29.1* 31.0* 28.8* 28.2* 27.6*  MCV 87.8 89.5 89.0 90.6 90.9 91.6 93.2  PLT 43* 49* 60* 90* 102* 108* 338*   Basic Metabolic Panel: Recent Labs  Lab 06/02/18 0611 06/03/18 0525 06/04/18 0534 06/05/18 0553 06/06/18 0548 06/07/18 0620 06/08/18 0607  NA 139 137 140 144 144 146* 143  K 3.4* 4.0 4.1 3.9 4.0 3.5 3.4*  CL 113* 110 113* 113* 113* 111 111  CO2 _0 GLUCOSE 88 89 95 94 82 90 83  BUN _1 CREATININE 0.48 0.41* 0.38* 0.45 0.50 0.42* 0.49  CALCIUM 6.7* 7.1* 7.4* 7.9* 7.8* 8.0* 8.0*  MG 1.9 1.9 1.9  --   --   --  2.0  PHOS 2.9 2.2* 2.5  --   --   --  2.6    GFR: Estimated Creatinine Clearance: 59.4 mL/min (by C-G formula based on SCr of 0.49 mg/dL). Liver Function Tests: Recent Labs  Lab 06/04/18 0534 06/05/18 0553 06/06/18 0548 06/07/18 0620 06/08/18 0607  AST 311* 351* 350* 354* 382*  ALT 46* 60* 59* 69* 88*  ALKPHOS 364* 406* 357* 422* 405*  BILITOT 0.5 0.6 0.3 0.4 0.6  PROT 5.6* 6.5 6.1* 6.3* 6.4*  ALBUMIN 1.5* 1.8* 1.7* 1.8* 1.9*   No results for input(s): LIPASE, AMYLASE in the last 168 hours. No results for input(s): AMMONIA in the last 168 hours. Coagulation Profile: Recent Labs  Lab 06/01/18 1344  INR 0.90   Cardiac Enzymes: Recent Labs  Lab 06/05/18 0553  CKTOTAL 165   BNP (last 3 results) No results for input(s): PROBNP in the last 8760 hours. HbA1C: No results for input(s): HGBA1C in the last 72 hours. CBG: No results for input(s): GLUCAP in the last 168 hours. Lipid Profile: No results for input(s): CHOL, HDL, LDLCALC, TRIG, CHOLHDL, LDLDIRECT in the last  72 hours. Thyroid Function Tests: No results for input(s): TSH, T4TOTAL, FREET4, T3FREE, THYROIDAB in the last 72 hours. Anemia Panel: No results for input(s): VITAMINB12, FOLATE, FERRITIN, TIBC, IRON, RETICCTPCT in the last 72 hours. Sepsis Labs: Recent Labs  Lab 06/02/18 0611  PROCALCITON 0.33    Recent Results (from the past 240 hour(s))  Culture, blood (routine x 2)     Status: None   Collection Time: 05/31/18  7:45 AM  Result Value Ref Range Status   Specimen Description   Final    BLOOD RIGHT ANTECUBITAL Performed at [facility] [address], [place], [place] [address]    Special Requests   Final    BOTTLES DRAWN AEROBIC AND ANAEROBIC Blood Culture adequate volume Performed at [facility] [address] [place], [place] [address]    Culture   Final    NO GROWTH 5 DAYS Performed at [facility], [place] [address], [place], [place] [address]    Report Status 06/05/2018 FINAL  Final   Culture, blood (routine x 2)     Status: None   Collection Time: 05/31/18  7:49 AM  Result Value Ref Range Status   Specimen Description   Final    BLOOD RIGHT ARM Performed at [facility] [address], [place], [place] [address]    Special Requests   Final    BOTTLES DRAWN AEROBIC AND ANAEROBIC Blood Culture adequate volume Performed at [facility] [address], [place], [place] [address]    Culture   Final    NO GROWTH 5 DAYS Performed at [facility], [place] [address] [place], [place] [address]    Report Status 06/05/2018 FINAL  Final  Culture, Urine     Status: Abnormal   Collection Time: 05/31/18  4:18 PM  Result Value Ref Range Status   Specimen Description   Final    URINE, RANDOM Performed at [facility] [address] [place],  [address]    Special Requests   Final    NONE Performed at [facility], [place] [address], [place],  [address]    Culture (A)  Final    60,000 COLONIES/mL MULTIPLE SPECIES PRESENT, SUGGEST RECOLLECTION   Report Status 06/02/2018 FINAL  Final     RN Pressure Injury Documentation: Pressure Injury 06/01/18 (Active)  06/01/18 1908   Location: Sacrum  Location Orientation:   Staging:   Wound Description (Comments):   Present on Admission:    Radiology Studies: No results found.  Scheduled Meds: . ARIPiprazole  15 mg Oral Daily  . escitalopram  5 mg Oral Daily  . feeding supplement (ENSURE ENLIVE)  237 mL Oral BID BM  . folic acid  1 mg Oral Daily  . hydroxychloroquine  200 mg Oral BID  . metoprolol tartrate  25 mg Oral BID  . multivitamin with minerals  1 tablet Oral Daily  . potassium chloride  40 mEq Oral BID WC  . predniSONE  50 mg Oral Q breakfast  . thiamine  100 mg Oral Daily   Or  . thiamine  100 mg Intravenous Daily   Continuous Infusions: . dextrose 5 % and 0.9% NaCl 50 mL/hr at 06/07/18 1500     LOS: 9 days    Kerney Elbe, DO [facility] PAGER is on [place]  If 7PM-7AM, please contact night-coverage [email or web address] Password [facility] 06/08/2018, 12:36 PM

## 2018-06-08 NOTE — Progress Notes (Addendum)
Patient needed a break from procedures yesterday so liver biopsy not done. IR has scheduled this for tomorrow.  Today labs:  Alk phos is stable at 405 AST continues to rise 354 >>382.  ALT slowly on the rise 59 >>69>> 88.   Will round on patient in next day or two. Mainly just waiting on liver biopsy results

## 2018-06-09 ENCOUNTER — Inpatient Hospital Stay (HOSPITAL_COMMUNITY): Payer: Medicaid Other

## 2018-06-09 LAB — CBC WITH DIFFERENTIAL/PLATELET
ABS IMMATURE GRANULOCYTES: 0.13 10*3/uL — AB (ref 0.00–0.07)
BASOS ABS: 0 10*3/uL (ref 0.0–0.1)
Basophils Relative: 0 %
EOS ABS: 0 10*3/uL (ref 0.0–0.5)
Eosinophils Relative: 0 %
HEMATOCRIT: 29.6 % — AB (ref 36.0–46.0)
HEMOGLOBIN: 9.1 g/dL — AB (ref 12.0–15.0)
IMMATURE GRANULOCYTES: 4 %
LYMPHS ABS: 1.3 10*3/uL (ref 0.7–4.0)
LYMPHS PCT: 37 %
MCH: 27.8 pg (ref 26.0–34.0)
MCHC: 30.7 g/dL (ref 30.0–36.0)
MCV: 90.5 fL (ref 80.0–100.0)
MONOS PCT: 8 %
Monocytes Absolute: 0.3 10*3/uL (ref 0.1–1.0)
NEUTROS ABS: 1.7 10*3/uL (ref 1.7–7.7)
NEUTROS PCT: 51 %
Platelets: 158 10*3/uL (ref 150–400)
RBC: 3.27 MIL/uL — ABNORMAL LOW (ref 3.87–5.11)
RDW: 16.7 % — ABNORMAL HIGH (ref 11.5–15.5)
WBC: 3.4 10*3/uL — ABNORMAL LOW (ref 4.0–10.5)
nRBC: 2.3 % — ABNORMAL HIGH (ref 0.0–0.2)

## 2018-06-09 LAB — COMPREHENSIVE METABOLIC PANEL
ALBUMIN: 2.2 g/dL — AB (ref 3.5–5.0)
ALK PHOS: 396 U/L — AB (ref 38–126)
ALT: 112 U/L — ABNORMAL HIGH (ref 0–44)
AST: 422 U/L — AB (ref 15–41)
Anion gap: 3 — ABNORMAL LOW (ref 5–15)
BILIRUBIN TOTAL: 0.7 mg/dL (ref 0.3–1.2)
BUN: 10 mg/dL (ref 6–20)
CALCIUM: 8.2 mg/dL — AB (ref 8.9–10.3)
CO2: 31 mmol/L (ref 22–32)
Chloride: 108 mmol/L (ref 98–111)
Creatinine, Ser: 0.52 mg/dL (ref 0.44–1.00)
GFR calc Af Amer: >60 mL/min (ref >60)
GFR calc non Af Amer: >60 mL/min (ref >60)
GLUCOSE: 80 mg/dL (ref 70–99)
Potassium: 3.9 mmol/L (ref 3.5–5.1)
Sodium: 142 mmol/L (ref 135–145)
TOTAL PROTEIN: 6.9 g/dL (ref 6.5–8.1)

## 2018-06-09 LAB — MAGNESIUM: Magnesium: 2.2 mg/dL (ref 1.7–2.4)

## 2018-06-09 LAB — PHOSPHORUS: Phosphorus: 3.5 mg/dL (ref 2.5–4.6)

## 2018-06-09 MED ORDER — FENTANYL CITRATE (PF) 100 MCG/2ML IJ SOLN
INTRAMUSCULAR | Status: AC | PRN
  Administered 2018-06-09: 50 ug via INTRAVENOUS

## 2018-06-09 MED ORDER — FENTANYL CITRATE (PF) 100 MCG/2ML IJ SOLN
INTRAMUSCULAR | Status: AC
  Filled 2018-06-09: qty 2

## 2018-06-09 MED ORDER — MIDAZOLAM HCL 2 MG/2ML IJ SOLN
INTRAMUSCULAR | Status: AC
  Filled 2018-06-09: qty 2

## 2018-06-09 MED ORDER — MIDAZOLAM HCL 2 MG/2ML IJ SOLN
INTRAMUSCULAR | Status: AC | PRN
  Administered 2018-06-09: 1 mg via INTRAVENOUS

## 2018-06-09 MED ORDER — LIDOCAINE HCL 1 % IJ SOLN
INTRAMUSCULAR | Status: AC
  Filled 2018-06-09: qty 20

## 2018-06-09 NOTE — Progress Notes (Signed)
PROGRESS NOTE    Yvonne [name]  GYK:[id number] DOB: 1966/06/28 DOA: 05/30/2018 PCP: Leonard Downing, MD   Brief Narrative:  HPI on 05/30/2018 by Dr. Gevena Barre Yvonne Jonesis a 52 y.o.femalewith medical history significant for bipolar disorder who resides in a group home and presented to the emergency room after having a fall following lightheadedness. In the emergency room, noted to have significant orthostatic hypotension.White count normal, although patient noted to have some mild transaminitis. Mildly elevated lipase level although she does not report any abdominal pain. Initially no evidence of fever. Chest x-ray unremarkable. Abdominal CT also unrevealing. Hospitalist were called for further evaluation.  Following admission to the floor, patient spiked a fever of 101.4, remained tachycardic. Lactic acid and procalcitonin level ordered.  **Was admitted for orthostatic hypotension and found to have sepsis secondary to an unclear etiology.  She spiked temperature as well as became tachycardic, tachypnea with leukopenia.  Work-up thus far have returned no source of infection.  She was noted to have positive autoimmune work-up and started on very low-dose prednisone and increased to 1 mg/kg.  Hematology consulted for leukopenia and thrombocytopenia which is now improving.  Bone marrow biopsy done 06/05/2018. GI also consulted for possible autoimmune hepatitis given Abnormal and Elevated LFTs. GI pursing Liver Biopsy and this was done 06/09/18. PT still recommending SNF  Assessment & Plan:   Principal Problem:   Orthostatic hypotension Active Problems:   Bipolar I disorder, current or most recent episode manic, with psychotic features (HCC)   Severe protein-calorie malnutrition (HCC)   Hypocalcemia   Transaminitis   Fever   Thrombocytopenia (HCC)  Sepsis secondary to unclear etiology -Patient did have a temperature of 101.8, tachycardia, tachypnea and leukopenia on  admission -Sepsis physiology does appear to have improved -Patient was empirically started on vancomycin and cefepime, and has received 5 days of antibiotics, and discontinued.  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  Was placed on CIWA protocol empirically -Right upper quadrant ultrasound showed coarse echotexture of the hepatic parenchyma suggesting diffuse hepatocellular disease, possible hepatitis.  Alcoholic areas around the portal triads suggesting edema, potentially related obstruction secondary adenopathy at the porta hepatis region.  May need MRI for further evaluation for possible neoplasm. -UDS, alcohol level and salicylate level unremarkable on admission -Hepatitis panel unremarkable -MRCP did not show any hepatic lesion.  No biliary duct dilatation or gallbladder sludge. -Gastroenterology consulted and appreciated, may need liver biopsy and this is getting planned for 06/09/18 as GI feels as if she needs it priort to initiation of long term steroids and because we do not know the exact etiology of her Abnormal LFT's  -Antimicrosomal antibody 0.8 -LFTs worsening; AST is now 422 and ALT is now 112 -Continue to monitor CMP -Patient to get Liver Biopsy in AM  Orthostatic hypotension -In the setting of dehydration from poor oral intake as well as sepsis -Patient given 2 L normal saline along with IV fluid continuous which have now stopped -PT OT recommended SNF  Pancytopenia, improved a little  -Patient with from Thrombocytopenia, anemia and leukopenia;  -Counts have improved and WBC is now stable at  3.4 -hemoglobin stable  and is now 9.1 and platelets improving and now 158 -Hematology oncology consulted and appreciated -Bone marrow biopsy planned for 06/05/2018 -Patient has been transfused 1 unit PRBC -Anemia panel showed iron 33, U IBC 82, TIBC 115, ferritin 5643, folate 9.3, vitamin B12 270 -Haptoglobin 27, HIT 0.713 -Pending Serotonin Release Assay given HIT  -Continue to monitor CBC  Suspected autoimmune disease  -Possibly lupus, Sjogren's, autoimmune hepatitis -LDH 1063, CRP 1.1, ESR 65 -ANA positive, double-stranded DNA 135, ENA SM antibody greater than 8, chromatin antibody greater than 8, ribonucleic protein 7.6, SSA antibody IgG greater than 8, SSB IgG G antibiogram than 8 -Mitochondrial M2 Ab, IgG <20.0 and LKM1 Ab 0.8 -Concern for autoimmune disorders -F-actin IgG was 37 -Patient started on hydroxychloroquine 63m twice daily, prednisone 1 mg/kg daily -Will be obtaining a Liver Biopsy and this was done 06/09/18 -Patient has a rash on fingers and toes  -Bone Marrow Biposy showed no Leukemia, Lymphoma or evidence of Cancer. There was no obvious Dysplasia. Mild changes -Patient will need to follow-up with rheumatology as an outpatient  Hypophosphatemia/ Hypomagnesemia/ Hypocalcemia -improved, continue to monitor -Corrected Calcium WNL  Hypokalemia -Patient's potassium this morning was 3.9 -Continue monitor replete as necessary -Repeat CMP in a.m.  Bipolar 1 disorder versus other underlying psychiatric disorder -Does not appear to be manic at this time -Continue Aripiprazole, Escitalopram, Hydroxazine  Underweight -Continue Ensure -Nutrition consulted  DVT Prophylaxis: SCDs Code Status: FULL CODE Family Communication: No family present at bedside Disposition Plan: SNF when medically stable  Consultants:   Gastroenterology  Interventional Radiology  Hematology/Oncology   Was discussed with rheumatology via telephone to Dr. GLenetta Quaker Yvonne [name]   Procedures:  RUQ UK[place]MRCP CT guided bone marrow biopsy  Liver Biopsy (scheduled for 06/09/18)  Antimicrobials: Anti-infectives (From admission, onward)   Start     Dose/Rate Route Frequency Ordered Stop   06/03/18 1000  hydroxychloroquine (PLAQUENIL) tablet 200 mg     200 mg Oral 2 times daily 06/03/18 0812     06/01/18 1730  vancomycin (VANCOCIN) IVPB 750 mg/150 ml premix  Status:  Discontinued     750 mg 150 mL/hr over 60 Minutes Intravenous Every 24 hours 05/31/18 1629 06/04/18 1125   05/31/18 1700  ceFEPIme (MAXIPIME) 1 g in sodium chloride 0.9 % 100 mL IVPB  Status:  Discontinued     1 g 200 mL/hr over 30 Minutes Intravenous Every 12 hours 05/31/18 1616 06/04/18 1125   05/31/18 1630  vancomycin (VANCOCIN) IVPB 1000 mg/200 mL premix     1,000 mg 200 mL/hr over 60 Minutes Intravenous  Once 05/31/18 1616 05/31/18 1810     Subjective: Seen and examined at bedside and states is doing okay.  Ready for her liver biopsy today.  No chest pain, lightheadedness or dizziness.  Discussed with her about her skin discoloration and rash in her fingers and she feels like this developed recently.  Objective: Vitals:   06/09/18 1550 06/09/18 1555 06/09/18 1600 06/09/18 1613  BP: 105/73 103/69  100/69  Pulse: 82 72  78  Resp: 14 (!) _0 Temp:    98.2 F (36.8 C)  TempSrc:      SpO2: 98% 98%  100%  Weight:      Height:        Intake/Output Summary (Last 24 hours) at 06/09/2018 1704 Last data filed at 06/09/2018 1011 Gross per 24 hour  Intake -  Output 1000 ml  Net -1000 ml   FAutoliv  05/30/18 0854 06/06/18 0956  Weight: 46.3 kg 46.3 kg   Examination: Physical Exam:  Constitutional: Thin, chronically ill-appearing African-American female currently no acute distress appears calm consultant in bed Eyes: Lids and conjunctive are normal.  Sclera anicteric HENMT: External ears nose appear normal.  Grossly normal hearing.  Mucous members are moist.  Has a  hematoma left side of her forehead which is now turning black Neck: Appears supple no JVD Respiratory: Managed to auscultation bilaterally with no appreciable wheezing, rales, rhonchi.  Patient is not tachypneic or using any accessory muscles to breathe Cardiovascular: Rate and rhythm.  No appreciable murmurs rubs, gallops.  No lower extremity edema Abdomen: Soft, nontender, nondistended.  Bowel sounds present in 4 quadrants GU: Deferred Musculoskeletal: No contractures or cyanosis  Skin: Is warm and dry.  Digits she does have some macular rashes fingers and toes and discoloration of unknown etiology but ? Scleroderma or Lupus Rash Neurologic: Cranial nerves II through XII grossly intact no appreciable focal deficits Psychiatric: Normal judgment and insight.  Patient is awake alert and oriented x3.  Slightly still appears anxious and paranoid and has a very flat and depressed appearing mood and affect  Data Reviewed: I have personally reviewed following labs and imaging studies  CBC: Recent Labs  Lab 06/03/18 0525 06/04/18 0534 06/05/18 0553 06/06/18 0548 06/07/18 0620 06/08/18 0607 06/09/18 0614  WBC 1.9* 2.3* 3.5* 2.7* 3.2* 4.2 3.4*  NEUTROABS 0.8* 0.9* 1.3*  --   --  2.3 1.7  HGB 9.4* 9.0* 9.7* 8.9* 8.7* 8.5* 9.1*  HCT 29.1* 29.1* 31.0* 28.8* 28.2* 27.6* 29.6*  MCV 89.5 89.0 90.6 90.9 91.6 93.2 90.5  PLT 49* 60* 90* 102* 108* 139* 774   Basic Metabolic Panel: Recent Labs  Lab 06/03/18 0525 06/04/18 0534 06/05/18 0553 06/06/18 0548 06/07/18 0620 06/08/18 0607 06/09/18 0614  NA 137 140 144 144 146* 143 142  K 4.0 4.1 3.9 4.0 3.5 3.4* 3.9  CL 110 113* 113* 113* 111 111 108  CO2 _0 GLUCOSE 89 95 94 82 90 83 80  BUN _1 CREATININE 0.41* 0.38* 0.45 0.50 0.42* 0.49 0.52  CALCIUM 7.1* 7.4* 7.9* 7.8* 8.0* 8.0* 8.2*  MG 1.9 1.9  --   --   --  2.0 2.2  PHOS 2.2* 2.5  --   --   --  2.6 3.5   GFR: Estimated Creatinine Clearance: 59.4 mL/min  (by C-G formula based on SCr of 0.52 mg/dL). Liver Function Tests: Recent Labs  Lab 06/05/18 0553 06/06/18 0548 06/07/18 0620 06/08/18 0607 06/09/18 0614  AST 351* 350* 354* 382* 422*  ALT 60* 59* 69* 88* 112*  ALKPHOS 406* 357* 422* 405* 396*  BILITOT 0.6 0.3 0.4 0.6 0.7  PROT 6.5 6.1* 6.3* 6.4* 6.9  ALBUMIN 1.8* 1.7* 1.8* 1.9* 2.2*   No results for input(s): LIPASE, AMYLASE in the last 168 hours. No results for input(s): AMMONIA in the last 168 hours. Coagulation Profile: No results for input(s): INR, PROTIME in the last 168 hours. Cardiac Enzymes: Recent Labs  Lab 06/05/18 0553  CKTOTAL 165   BNP (last 3 results) No results for input(s): PROBNP in the last 8760 hours. HbA1C: No results for input(s): HGBA1C in the last 72 hours. CBG: No results for input(s): GLUCAP in the last 168 hours. Lipid Profile: No results for input(s): CHOL, HDL, LDLCALC, TRIG, CHOLHDL, LDLDIRECT in the last 72 hours. Thyroid Function Tests: No  results for input(s): TSH, T4TOTAL, FREET4, T3FREE, THYROIDAB in the last 72 hours. Anemia Panel: No results for input(s): VITAMINB12, FOLATE, FERRITIN, TIBC, IRON, RETICCTPCT in the last 72 hours. Sepsis Labs: No results for input(s): PROCALCITON, LATICACIDVEN in the last 168 hours.  Recent Results (from the past 240 hour(s))  Culture, blood (routine x 2)     Status: None   Collection Time: 05/31/18  7:45 AM  Result Value Ref Range Status   Specimen Description   Final    BLOOD RIGHT ANTECUBITAL Performed at [facility] [address], [place], [place] [address]    Special Requests   Final    BOTTLES DRAWN AEROBIC AND ANAEROBIC Blood Culture adequate volume Performed at [facility] [address], [place], [place] [address]    Culture   Final    NO GROWTH 5 DAYS Performed at [facility], [place] [address] [place], [place] [address]    Report Status 06/05/2018 FINAL  Final  Culture, blood  (routine x 2)     Status: None   Collection Time: 05/31/18  7:49 AM  Result Value Ref Range Status   Specimen Description   Final    BLOOD RIGHT ARM Performed at [facility] [address], [place], [place] [address]    Special Requests   Final    BOTTLES DRAWN AEROBIC AND ANAEROBIC Blood Culture adequate volume Performed at [facility] [address], [place], [place] [address]    Culture   Final    NO GROWTH 5 DAYS Performed at [facility], [place] [address], [place], [place] [address]    Report Status 06/05/2018 FINAL  Final  Culture, Urine     Status: Abnormal   Collection Time: 05/31/18  4:18 PM  Result Value Ref Range Status   Specimen Description   Final    URINE, RANDOM Performed at [facility] [address] [place], [place] [address]    Special Requests   Final    NONE Performed at [facility], [place] [address] [place], [place] [address]    Culture (A)  Final    60,000 COLONIES/mL MULTIPLE SPECIES PRESENT, SUGGEST RECOLLECTION   Report Status 06/02/2018 FINAL  Final     RN Pressure Injury Documentation: Pressure Injury 06/01/18 (Active)  06/01/18 1908   Location: Sacrum  Location Orientation:   Staging:   Wound Description (Comments):   Present on Admission:    Radiology Studies: US Biopsy (liver)  Result Date: 06/09/2018 CLINICAL DATA:  Pancytopenia, transaminitis EXAM: ULTRASOUND-GUIDED CORE LIVER BIOPSY TECHNIQUE: An ultrasound guided liver biopsy was thoroughly discussed with the patient and questions were answered. The benefits, risks, alternatives, and complications were also discussed. The patient understands and wishes to proceed with the procedure. A verbal as well as written consent was obtained. Survey ultrasound of the liver was performed and an appropriate skin entry site was determined. Skin site was marked, prepped with Betadine, and draped in usual sterile  fashion, and infiltrated locally with 1% lidocaine. Intravenous Fentanyl and Versed were administered as conscious sedation during continuous monitoring of the patient's level of consciousness and physiological / cardiorespiratory status by the radiology RN, with a total moderate sedation time of 10 minutes. A 17 gauge trocar needle was advanced under ultrasound guidance into the liver. 3 coaxial 18gauge core samples were then obtained through the guide needle. The guide needle was removed. Post procedure scans demonstrate no apparent  complication. COMPLICATIONS: COMPLICATIONS None immediate FINDINGS: Survey ultrasound of the liver demonstrates no focal lesion or biliary ductal dilatation. Representative 18 gauge core biopsy samples obtained as above. IMPRESSION: 1. Technically successful ultrasound guided core liver biopsy. Electronically Signed   By: Lucrezia Europe M.D.   On: 06/09/2018 16:08    Scheduled Meds: . ARIPiprazole  15 mg Oral Daily  . escitalopram  5 mg Oral Daily  . feeding supplement (ENSURE ENLIVE)  237 mL Oral BID BM  . fentaNYL      . folic acid  1 mg Oral Daily  . hydroxychloroquine  200 mg Oral BID  . lidocaine      . metoprolol tartrate  25 mg Oral BID  . midazolam      . multivitamin with minerals  1 tablet Oral Daily  . predniSONE  50 mg Oral Q breakfast  . thiamine  100 mg Oral Daily   Or  . thiamine  100 mg Intravenous Daily   Continuous Infusions:    LOS: 10 days   Kerney Elbe, DO [facility] PAGER is on AMION  If 7PM-7AM, please contact night-coverage [email or web address] Password [email or web address] 06/09/2018, 5:04 PM

## 2018-06-09 NOTE — Progress Notes (Signed)
PT Cancellation Note  Patient Details Name: Yvonne BalsamMelvine [name] MRN: [id number] DOB: Aug 26, 1965   Cancelled Treatment:    Reason Eval/Treat Not Completed: Patient at procedure or test/unavailable - Per RN, pt going for liver biopsy procedure momentarily, RN deferring PT session today. PT to follow up as schedule allows.  Nicola PoliceAlexa D Woodard Perrell, PT Acute Rehabilitation Services Pager [phone]  Office [phone]    Tyrone AppleAlexa D Despina Hiddenure 06/09/2018, 11:54 AM

## 2018-06-09 NOTE — NC FL2 (Signed)
[place] MEDICAID FL2 LEVEL OF CARE SCREENING TOOL     IDENTIFICATION  Patient Name: Yvonne BalsamMelvine Robar Birthdate: [birth year] Sex: female Admission Date (Current Location): 05/30/2018  [facility] [place]   Facility and Address:  [facility],  [address], [place] [id number]      Provider Number: [id number]  Attending Physician Name and Address:  Merlene LaughterSheikh, Omair Latif, DO  Relative Name and Phone Number:       Current Level of Care: Hospital Recommended Level of Care: Skilled Nursing Facility Prior Approval Number:    Date Approved/Denied:   PASRR Number:    Discharge Plan: SNF    Current Diagnoses: Patient Active Problem List   Diagnosis Date Noted  . Other pancytopenia (HCC) 06/02/2018  . Orthostatic hypotension 05/30/2018  . Severe protein-calorie malnutrition (HCC) 05/30/2018  . Hypocalcemia 05/30/2018  . Transaminitis 05/30/2018  . Fever 05/30/2018  . Thrombocytopenia (HCC) 05/30/2018  . Severe bipolar I disorder, most recent episode depressed (HCC) 06/01/2017  . Bipolar I disorder, current or most recent episode manic, with psychotic features (HCC)   . Affective psychosis, bipolar (HCC) 01/05/2017  . Manic behavior (HCC)     Orientation RESPIRATION BLADDER Height & Weight     Self, Time, Situation, Place  Normal Incontinent Weight: 102 lb 1.2 oz (46.3 kg) Height:  5\' 2"  (157.5 cm)  BEHAVIORAL SYMPTOMS/MOOD NEUROLOGICAL BOWEL NUTRITION STATUS      Continent Diet(Please see discharge summary)  AMBULATORY STATUS COMMUNICATION OF NEEDS Skin   Limited Assist Verbally Other (Comment)(Pressure injury- sacrum)                       Personal Care Assistance Level of Assistance  Bathing, Feeding, Dressing Bathing Assistance: Limited assistance Feeding assistance: Independent Dressing Assistance: Limited assistance     Functional Limitations Info             SPECIAL CARE FACTORS FREQUENCY  PT (By licensed PT), OT (By  licensed OT)     PT Frequency: 5 OT Frequency: 5            Contractures      Additional Factors Info  Allergies, Code Status Code Status Info: Full Code  Allergies Info: NKA            Current Medications (06/09/2018):  This is the current hospital active medication list Current Facility-Administered Medications  Medication Dose Route Frequency Provider Last Rate Last Dose  . ARIPiprazole (ABILIFY) tablet 15 mg  15 mg Oral Daily Hollice EspyKrishnan, Sendil K, MD   15 mg at 06/08/18 0934  . escitalopram (LEXAPRO) tablet 5 mg  5 mg Oral Daily Hollice EspyKrishnan, Sendil K, MD   5 mg at 06/08/18 0934  . feeding supplement (ENSURE ENLIVE) (ENSURE ENLIVE) liquid 237 mL  237 mL Oral BID BM Hollice EspyKrishnan, Sendil K, MD   237 mL at 06/07/18 1023  . folic acid (FOLVITE) tablet 1 mg  1 mg Oral Daily Marguerita MerlesSheikh, Omair [place], DO   1 mg at 06/08/18 0935  . hydroxychloroquine (PLAQUENIL) tablet 200 mg  200 mg Oral BID Marguerita MerlesSheikh, Omair [place], DO   200 mg at 06/08/18 2121  . hydrOXYzine (ATARAX/VISTARIL) tablet 25 mg  25 mg Oral TID PRN Hollice EspyKrishnan, Sendil K, MD   25 mg at 06/05/18 2252  . ibuprofen (ADVIL,MOTRIN) tablet 400 mg  400 mg Oral Q6H PRN Marguerita MerlesSheikh, Omair [place], DO   400 mg at 06/06/18 1111  . metoprolol tartrate (LOPRESSOR) tablet 25 mg  25 mg Oral BID Marguerita Merles [place], [place]   25 mg at 06/08/18 2120  . multivitamin with minerals tablet 1 tablet  1 tablet Oral Daily Marguerita Merles [place], DO   1 tablet at 06/08/18 0935  . naloxone [facility]) injection 0.4 mg  0.4 mg Intravenous PRN Edsel Petrin, DO   0.4 mg at 06/05/18 1225  . ondansetron (ZOFRAN) tablet 4 mg  4 mg Oral Q6H PRN Hollice Espy, MD       Or  . ondansetron [facility]) injection 4 mg  4 mg Intravenous Q6H PRN Hollice Espy, MD      . polyethylene glycol (MIRALAX / GLYCOLAX) packet 17 g  17 g Oral Daily PRN Hollice Espy, MD      . predniSONE (DELTASONE) tablet 50 mg  50 mg Oral Q breakfast Marguerita Merles [place], [place]   Stopped at 06/09/18 0932  .  thiamine (VITAMIN B-1) tablet 100 mg  100 mg Oral Daily Marguerita Merles [place], DO   100 mg at 06/08/18 5284   Or  . thiamine (B-1) injection 100 mg  100 mg Intravenous Daily Marguerita Merles Latif, DO   100 mg at 05/31/18 1240     Discharge Medications: Please see discharge summary for a list of discharge medications.  Relevant Imaging Results:  Relevant Lab Results:   Additional Information    Donnie Coffin, LCSW

## 2018-06-09 NOTE — Procedures (Signed)
  Procedure: US core liver bx 18g x3 EBL:   minimal Complications:  none immediate  See full dictation in [facility] PACS.  D. Cordney Barstow MD Main # [phone] Pager  [phone]    

## 2018-06-09 NOTE — Progress Notes (Signed)
CSW spoke with patient regarding disposition plans- patient is agreeable to SNF placement at this time and would prefer to [place] in the [place] area. CSW faxed needed information to SNF's in the area and will follow up with patient.  CSW requested PASRR number for patient however it went to level 2- will need to submit requested information to [place] MUST.   Stacy GardnerErin Mihaela Fajardo, LCSW Clinical Social Worker  [facility] [employer]  [phone]

## 2018-06-10 LAB — CBC WITH DIFFERENTIAL/PLATELET
ABS IMMATURE GRANULOCYTES: 0.12 10*3/uL — AB (ref 0.00–0.07)
Basophils Absolute: 0 10*3/uL (ref 0.0–0.1)
Basophils Relative: 0 %
Eosinophils Absolute: 0 10*3/uL (ref 0.0–0.5)
Eosinophils Relative: 0 %
HCT: 29.9 % — ABNORMAL LOW (ref 36.0–46.0)
Hemoglobin: 9.1 g/dL — ABNORMAL LOW (ref 12.0–15.0)
Immature Granulocytes: 3 %
Lymphocytes Relative: 29 %
Lymphs Abs: 1 10*3/uL (ref 0.7–4.0)
MCH: 28.1 pg (ref 26.0–34.0)
MCHC: 30.4 g/dL (ref 30.0–36.0)
MCV: 92.3 fL (ref 80.0–100.0)
Monocytes Absolute: 0.3 10*3/uL (ref 0.1–1.0)
Monocytes Relative: 7 %
Neutro Abs: 2.2 10*3/uL (ref 1.7–7.7)
Neutrophils Relative %: 61 %
Platelets: 145 10*3/uL — ABNORMAL LOW (ref 150–400)
RBC: 3.24 MIL/uL — ABNORMAL LOW (ref 3.87–5.11)
RDW: 17.2 % — ABNORMAL HIGH (ref 11.5–15.5)
WBC: 3.6 10*3/uL — ABNORMAL LOW (ref 4.0–10.5)
nRBC: 2.5 % — ABNORMAL HIGH (ref 0.0–0.2)

## 2018-06-10 LAB — MAGNESIUM: Magnesium: 2.2 mg/dL (ref 1.7–2.4)

## 2018-06-10 LAB — COMPREHENSIVE METABOLIC PANEL
ALT: 129 U/L — AB (ref 0–44)
AST: 420 U/L — ABNORMAL HIGH (ref 15–41)
Albumin: 2.2 g/dL — ABNORMAL LOW (ref 3.5–5.0)
Alkaline Phosphatase: 366 U/L — ABNORMAL HIGH (ref 38–126)
Anion gap: 8 (ref 5–15)
BUN: 17 mg/dL (ref 6–20)
CO2: 30 mmol/L (ref 22–32)
Calcium: 8.3 mg/dL — ABNORMAL LOW (ref 8.9–10.3)
Chloride: 103 mmol/L (ref 98–111)
Creatinine, Ser: 0.65 mg/dL (ref 0.44–1.00)
GFR calc Af Amer: >60 mL/min (ref >60)
GFR calc non Af Amer: >60 mL/min (ref >60)
Glucose, Bld: 85 mg/dL (ref 70–99)
Potassium: 4.4 mmol/L (ref 3.5–5.1)
Sodium: 141 mmol/L (ref 135–145)
Total Bilirubin: 0.7 mg/dL (ref 0.3–1.2)
Total Protein: 7.1 g/dL (ref 6.5–8.1)

## 2018-06-10 LAB — PHOSPHORUS: Phosphorus: 5.8 mg/dL — ABNORMAL HIGH (ref 2.5–4.6)

## 2018-06-10 MED ORDER — LIP MEDEX EX OINT
TOPICAL_OINTMENT | CUTANEOUS | Status: AC
  Administered 2018-06-10: 19:00:00
  Filled 2018-06-10: qty 7

## 2018-06-10 NOTE — Progress Notes (Signed)
Physical Therapy Treatment Patient Details Name: Yvonne [name] MRN: [id number] DOB: [birth year] Today's Date: 06/10/2018    History of Present Illness 52 yo female presented to ED after a fall. Found to have orthostatic hypotension, fever, sepsis-unclear etiology, possible autoimmune disease. Patient resides in a group home.      PT Comments    Progressing with mobility.    Follow Up Recommendations  SNF     Equipment Recommendations  None recommended by PT    Recommendations for Other Services       Precautions / Restrictions Precautions Precautions: Fall Restrictions Weight Bearing Restrictions: No    Mobility  Bed Mobility Overal bed mobility: Needs Assistance Bed Mobility: Supine to Sit     Supine to sit: Min assist     General bed mobility comments: Increased time. Assist to steady trunk during transiton. Pt denied dizziness.   Transfers Overall transfer level: Needs assistance Equipment used: Rolling walker (2 wheeled) Transfers: Sit to/from Stand Sit to Stand: Min assist         General transfer comment: Assist to rise, stabilize, control descent. VCs safety, technique, hand placement.   Ambulation/Gait Ambulation/Gait assistance: Min assist Gait Distance (Feet): 25 Feet Assistive device: Rolling walker (2 wheeled) Gait Pattern/deviations: Step-through pattern;Decreased stride length;Decreased step length - left;Decreased step length - right     General Gait Details: Assist to steady pt throughout distance. VCs safety. Followed closely with recliner. Pt fatigues fairly easily.   Stairs             Wheelchair Mobility    Modified Rankin (Stroke Patients Only)       Balance Overall balance assessment: Needs assistance         Standing balance support: Bilateral upper extremity supported Standing balance-Leahy Scale: Poor                              Cognition Arousal/Alertness: Awake/alert Behavior During Therapy:  Flat affect Overall Cognitive Status: No family/caregiver present to determine baseline cognitive functioning                                        Exercises      General Comments        Pertinent Vitals/Pain Pain Assessment: No/denies pain    Home Living                      Prior Function            PT Goals (current goals can now be found in the care plan section) Progress towards PT goals: Progressing toward goals    Frequency    Min 2X/week      PT Plan Current plan remains appropriate    Co-evaluation              AM-PAC PT "6 Clicks" Mobility   Outcome Measure  Help needed turning from your back to your side while in a flat bed without using bedrails?: A Little Help needed moving from lying on your back to sitting on the side of a flat bed without using bedrails?: A Little Help needed moving to and from a bed to a chair (including a wheelchair)?: A Little Help needed standing up from a chair using your arms (e.g., wheelchair or bedside chair)?: A Little Help needed to walk in hospital  room?: A Little Help needed climbing 3-5 steps with a railing? : A Lot 6 Click Score: 17    End of Session Equipment Utilized During Treatment: Gait belt Activity Tolerance: Patient limited by fatigue Patient left: in chair;with call bell/phone within reach;with chair alarm set   PT Visit Diagnosis: Muscle weakness (generalized) (M62.81)     Time: [id number] PT Time Calculation (min) (ACUTE ONLY): 18 min  Charges:  $Gait Training: 8-22 mins                        Rebeca AlertJannie Talani Brazee, PT Acute Rehabilitation Services Pager: [phone] Office: [phone]

## 2018-06-10 NOTE — Progress Notes (Signed)
PROGRESS NOTE    Yvonne [name]  YTR:[id number] DOB: [birth year] DOA: 05/30/2018 PCP: Yvonne Downing, MD   Brief Narrative:  HPI on 05/30/2018 by Yvonne [name] Yvonne [name] is a 52 y.o. female with medical history significant for bipolar disorder who resides in a group home and presented to the emergency room after having a fall following lightheadedness.  In the emergency room, noted to have significant orthostatic hypotension.White count normal, although patient noted to have some mild transaminitis.  Mildly elevated lipase level although she does not report any abdominal pain.  Initially no evidence of fever.  Chest x-ray unremarkable.  Abdominal CT also unrevealing.  Hospitalist were called for further evaluation.  Following admission to the floor, patient spiked a fever of 101.4, remained tachycardic.  Lactic acid and procalcitonin level ordered.  Interim history Was admitted for orthostatic hypotension and found to have sepsis secondary to an unclear etiology.  She spiked temperature as well as became tachycardic, tachypnea with leukopenia.  Work-up thus far have returned no source.  She was noted to have positive autoimmune work-up and started on very low-dose prednisone.  Hematology consulted for leukopenia and thrombocytopenia.  Bone marrow biopsy planned for 06/05/2018.  GI also consulted for possible autoimmune hepatitis.S/p Bone marrow biopsy. S/p liver biopsy, pending results. SNF at discharge.   Assessment & Plan   Sepsis secondary to unclear etiology -Patient did have a temperature of 101.8, tachycardia, tachypnea and leukopenia -Sepsis physiology does appear to have improved -Patient was empirically started on vancomycin and cefepime, and has received 5 days of antibiotics, and discontinued.  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  Was placed on CIWA protocol empirically -Right upper quadrant ultrasound showed coarse echotexture of the hepatic parenchyma suggesting diffuse hepatocellular disease, possible hepatitis.  Alcoholic areas around the portal triads suggesting edema, potentially related obstruction secondary adenopathy at the porta hepatis region.  May need MRI for further evaluation for possible neoplasm. -UDS, alcohol level and salicylate level unremarkable on admission -Hepatitis panel unremarkable, Antimicrosomal antibody 0.8 -MRCP did not show any hepatic lesion.  No biliary duct dilatation or gallbladder sludge. -Gastroenterology consulted and appreciated, recommended liver biopsy -LFTs continue to worsen -IR consulted, s/p US liver biopsy, results pending  -Continue to monitor CMP  Orthostatic hypotension -In the setting of dehydration from poor oral intake as well as sepsis -Patient given 2 L normal saline along with IV fluid continuous -PT OT recommended SNF  Pancytopenia -Patient with thrombocytopenia, anemia and leukopenia -hemoglobin 9.1 and platelets 1425 (improving) -Hematology oncology consulted and appreciated -Bone marrow biopsy planned for 06/05/2018, showed no Leukemia, Lymphoma or evidence of Cancer. There was no obvious Dysplasia. 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  -Continue to monitor CBC  Suspected autoimmune disease -Possibly lupus, Sjogren's, autoimmune hepatitis -LDH 1063, CRP 1.1, ESR 65 -ANA positive, double-stranded DNA 135, ENA SM antibody greater than 8, chromatin antibody greater than 8, ribonucleic protein 7.6, SSA antibody IgG greater than 8, SSB IgG G antibiogram than  8 -Concern for autoimmune disorders -F-actin IgG was 37 -Patient started on hydroxychloroquine 17m twice daily, prednisone 1 mg/kg daily -Patient  will need to follow-up with rheumatology as an outpatient -s/p bone marrow biopsy- as above  Hypophosphatemia/ Hypomagnesemia/ Hypokalemia/ Hypocalcemia -improved, continue to monitor  Bipolar 1 disorder versus other underlying psychiatric disorder -Does not appear to be manic at this time -Continue aripiprazole, escitalopram  Underweight -Continue Ensure -Nutrition consulted  DVT Prophylaxis  SCDs  Code Status: Full  Family Communication: None at bedside  Disposition Plan: Admitted. Pending biopsy results and SNF  Consultants Hematology/Oncology Gastroenterology Interventional radiology Rheumatology, via phone, Yvonne [name] [name] (by Yvonne [name])  Procedures  RUQ [place] MRCP CT guided bone marrow biopsy  US biopsy liver  Antibiotics   Anti-infectives (From admission, onward)   Start     Dose/Rate Route Frequency Ordered Stop   06/03/18 1000  hydroxychloroquine (PLAQUENIL) tablet 200 mg     200 mg Oral 2 times daily 06/03/18 0812     06/01/18 1730  vancomycin (VANCOCIN) IVPB 750 mg/150 ml premix  Status:  Discontinued     750 mg 150 mL/hr over 60 Minutes Intravenous Every 24 hours 05/31/18 1629 06/04/18 1125   05/31/18 1700  ceFEPIme (MAXIPIME) 1 g in sodium chloride 0.9 % 100 mL IVPB  Status:  Discontinued     1 g 200 mL/hr over 30 Minutes Intravenous Every 12 hours 05/31/18 1616 06/04/18 1125   05/31/18 1630  vancomycin (VANCOCIN) IVPB 1000 mg/200 mL premix     1,000 mg 200 mL/hr over 60 Minutes Intravenous  Once 05/31/18 1616 05/31/18 1810      Subjective:   Yvonne [name] seen and examined today.  No complaints this morning.  Feels her weakness is improving mildly.  Denies current chest pain, shortness of breath, abdominal pain, nausea or vomiting, diarrhea or constipation.    Objective:   Vitals:   06/09/18  1600 06/09/18 1613 06/09/18 2117 06/10/18 0430  BP:  100/69 94/64 102/73  Pulse:  78 86 75  Resp: '20 17 14 16  ' Temp:  98.2 F (36.8 C) 99.4 F (37.4 C) 98.8 F (37.1 C)  TempSrc:   Oral Oral  SpO2:  100% 100% 100%  Weight:      Height:        Intake/Output Summary (Last 24 hours) at 06/10/2018 1249 Last data filed at 06/10/2018 0900 Gross per 24 hour  Intake 240 ml  Output 950 ml  Net -710 ml   Filed Weights   05/30/18 0854 06/06/18 0956  Weight: 46.3 kg 46.3 kg   Exam  General: Well developed, chronically ill appearing, NAD  HEENT: NCAT, mucous membranes moist.   Neck: Supple  Cardiovascular: S1 S2 auscultated, RRR, no murmur  Respiratory: Clear to auscultation bilaterally with equal chest rise  Abdomen: Soft, nontender, nondistended, + bowel sounds  Extremities: warm dry without cyanosis clubbing or edema  Neuro: AAOx3, nonfocal  Skin: rash on fingers, toes/discolartion   Psych: Flat affect  Data Reviewed: I have personally reviewed following labs and imaging studies  CBC: Recent Labs  Lab 06/04/18 0534 06/05/18 0553 06/06/18 0548 06/07/18 0620 06/08/18 0607 06/09/18 0614 06/10/18 0551  WBC 2.3* 3.5* 2.7* 3.2* 4.2 3.4* 3.6*  NEUTROABS 0.9* 1.3*  --   --  2.3 1.7 2.2  HGB 9.0* 9.7* 8.9* 8.7* 8.5* 9.1* 9.1*  HCT 29.1* 31.0* 28.8* 28.2* 27.6* 29.6* 29.9*  MCV 89.0 90.6 90.9 91.6 93.2 90.5 92.3  PLT 60* 90* 102* 108* 139* 158 364*   Basic Metabolic Panel: Recent Labs  Lab 06/04/18 0534  06/06/18 0548 06/07/18 0620 06/08/18 6803  06/09/18 0614 06/10/18 0551  NA 140   < > 144 146* 143 142 141  K 4.1   < > 4.0 3.5 3.4* 3.9 4.4  CL 113*   < > 113* 111 111 108 103  CO2 23   < > '28 30 28 31 30  ' GLUCOSE 95   < > 82 90 83 80 85  BUN 8   < > '12 11 9 10 17  ' CREATININE 0.38*   < > 0.50 0.42* 0.49 0.52 0.65  CALCIUM 7.4*   < > 7.8* 8.0* 8.0* 8.2* 8.3*  MG 1.9  --   --   --  2.0 2.2 2.2  PHOS 2.5  --   --   --  2.6 3.5 5.8*   < > = values in this  interval not displayed.   GFR: Estimated Creatinine Clearance: 59.4 mL/min (by C-G formula based on SCr of 0.65 mg/dL). Liver Function Tests: Recent Labs  Lab 06/06/18 0548 06/07/18 0620 06/08/18 0607 06/09/18 0614 06/10/18 0551  AST 350* 354* 382* 422* 420*  ALT 59* 69* 88* 112* 129*  ALKPHOS 357* 422* 405* 396* 366*  BILITOT 0.3 0.4 0.6 0.7 0.7  PROT 6.1* 6.3* 6.4* 6.9 7.1  ALBUMIN 1.7* 1.8* 1.9* 2.2* 2.2*   No results for input(s): LIPASE, AMYLASE in the last 168 hours. No results for input(s): AMMONIA in the last 168 hours. Coagulation Profile: No results for input(s): INR, PROTIME in the last 168 hours. Cardiac Enzymes: Recent Labs  Lab 06/05/18 0553  CKTOTAL 165   BNP (last 3 results) No results for input(s): PROBNP in the last 8760 hours. HbA1C: No results for input(s): HGBA1C in the last 72 hours. CBG: No results for input(s): GLUCAP in the last 168 hours. Lipid Profile: No results for input(s): CHOL, HDL, LDLCALC, TRIG, CHOLHDL, LDLDIRECT in the last 72 hours. Thyroid Function Tests: No results for input(s): TSH, T4TOTAL, FREET4, T3FREE, THYROIDAB in the last 72 hours. Anemia Panel: No results for input(s): VITAMINB12, FOLATE, FERRITIN, TIBC, IRON, RETICCTPCT in the last 72 hours. Urine analysis:    Component Value Date/Time   COLORURINE YELLOW 05/24/2018 0111   APPEARANCEUR HAZY (A) 05/24/2018 0111   LABSPEC 1.017 05/24/2018 0111   PHURINE 5.0 05/24/2018 0111   GLUCOSEU NEGATIVE 05/24/2018 0111   HGBUR NEGATIVE 05/24/2018 0111   BILIRUBINUR NEGATIVE 05/24/2018 0111   KETONESUR NEGATIVE 05/24/2018 0111   PROTEINUR NEGATIVE 05/24/2018 0111   UROBILINOGEN 0.2 02/27/2014 0501   NITRITE NEGATIVE 05/24/2018 0111   LEUKOCYTESUR SMALL (A) 05/24/2018 0111   Sepsis Labs: '@LABRCNTIP' (procalcitonin:4,lacticidven:4)  ) Recent Results (from the past 240 hour(s))  Culture, Urine     Status: Abnormal   Collection Time: 05/31/18  4:18 PM  Result Value Ref Range  Status   Specimen Description   Final    URINE, RANDOM Performed at [facility], [place] [address], [place], [place] [address]    Special Requests   Final    NONE Performed at [facility], [place] [address], [place], [place] [address]    Culture (A)  Final    60,000 COLONIES/mL MULTIPLE SPECIES PRESENT, SUGGEST RECOLLECTION   Report Status 06/02/2018 FINAL  Final      Radiology Studies: US Biopsy (liver)  Result Date: 06/09/2018 CLINICAL DATA:  Pancytopenia, transaminitis EXAM: ULTRASOUND-GUIDED CORE LIVER BIOPSY TECHNIQUE: An ultrasound guided liver biopsy was thoroughly discussed with the patient and questions were answered. The benefits, risks, alternatives, and complications were also discussed. The patient  understands and wishes to proceed with the procedure. A verbal as well as written consent was obtained. Survey ultrasound of the liver was performed and an appropriate skin entry site was determined. Skin site was marked, prepped with Betadine, and draped in usual sterile fashion, and infiltrated locally with 1% lidocaine. Intravenous Fentanyl and Versed were administered as conscious sedation during continuous monitoring of the patient's level of consciousness and physiological / cardiorespiratory status by the radiology RN, with a total moderate sedation time of 10 minutes. A 17 gauge trocar needle was advanced under ultrasound guidance into the liver. 3 coaxial 18gauge core samples were then obtained through the guide needle. The guide needle was removed. Post procedure scans demonstrate no apparent complication. COMPLICATIONS: COMPLICATIONS None immediate FINDINGS: Survey ultrasound of the liver demonstrates no focal lesion or biliary ductal dilatation. Representative 18 gauge core biopsy samples obtained as above. IMPRESSION: 1. Technically successful ultrasound guided core liver biopsy. Electronically Signed   By: Lucrezia Europe M.D.   On: 06/09/2018  16:08     Scheduled Meds: . lip balm      . ARIPiprazole  15 mg Oral Daily  . escitalopram  5 mg Oral Daily  . feeding supplement (ENSURE ENLIVE)  237 mL Oral BID BM  . folic acid  1 mg Oral Daily  . hydroxychloroquine  200 mg Oral BID  . metoprolol tartrate  25 mg Oral BID  . multivitamin with minerals  1 tablet Oral Daily  . predniSONE  50 mg Oral Q breakfast  . thiamine  100 mg Oral Daily   Or  . thiamine  100 mg Intravenous Daily   Continuous Infusions:    LOS: 11 days   Time Spent in minutes   30 minutes   Isaac Lacson D.O. on 06/10/2018 at 12:49 PM  Between 7am to 7pm - Please see pager noted on [email or web address]  After 7pm go to [email or web address]  And look for the night coverage person covering for me after hours  [facility] Office  [phone]

## 2018-06-11 NOTE — Progress Notes (Signed)
PROGRESS NOTE    Bera Pinela  ZOX:[id number] DOB: June 18, 1966 DOA: 05/30/2018 PCP: Leonard Downing, MD   Brief Narrative:  HPI on 05/30/2018 by Dr. Gevena Barre Peytin Dechert is a 52 y.o. female with medical history significant for bipolar disorder who resides in a group home and presented to the emergency room after having a fall following lightheadedness.  In the emergency room, noted to have significant orthostatic hypotension.White count normal, although patient noted to have some mild transaminitis.  Mildly elevated lipase level although she does not report any abdominal pain.  Initially no evidence of fever.  Chest x-ray unremarkable.  Abdominal CT also unrevealing.  Hospitalist were called for further evaluation.  Following admission to the floor, patient spiked a fever of 101.4, remained tachycardic.  Lactic acid and procalcitonin level ordered.  Interim history Was admitted for orthostatic hypotension and found to have sepsis secondary to an unclear etiology.  She spiked temperature as well as became tachycardic, tachypnea with leukopenia.  Work-up thus far have returned no source.  She was noted to have positive autoimmune work-up and started on very low-dose prednisone.  Hematology consulted for leukopenia and thrombocytopenia.  Bone marrow biopsy planned for 06/05/2018.  GI also consulted for possible autoimmune hepatitis.S/p Bone marrow biopsy. S/p liver biopsy, pending results. SNF at discharge.   Assessment & Plan   Sepsis secondary to unclear etiology -Patient did have a temperature of 101.8, tachycardia, tachypnea and leukopenia -Sepsis physiology does appear to have improved -Patient was empirically started on vancomycin and cefepime, and has received 5 days of antibiotics, and discontinued.  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  Was placed on CIWA protocol empirically -Right upper quadrant ultrasound showed coarse echotexture of the hepatic parenchyma suggesting diffuse hepatocellular disease, possible hepatitis.  Alcoholic areas around the portal triads suggesting edema, potentially related obstruction secondary adenopathy at the porta hepatis region.  May need MRI for further evaluation for possible neoplasm. -UDS, alcohol level and salicylate level unremarkable on admission -Hepatitis panel unremarkable, Antimicrosomal antibody 0.8 -MRCP did not show any hepatic lesion.  No biliary duct dilatation or gallbladder sludge. -Gastroenterology consulted and appreciated, recommended liver biopsy -LFTs continue to worsen -IR consulted, s/p US liver biopsy, results pending  -Continue to monitor CMP  Orthostatic hypotension -In the setting of dehydration from poor oral intake as well as sepsis -Patient given 2 L normal saline along with IV fluid continuous -PT OT recommended SNF  Pancytopenia -Patient with thrombocytopenia, anemia and leukopenia -hemoglobin 9.1 and platelets 1425 (improving) -Hematology oncology consulted and appreciated -Bone marrow biopsy planned for 06/05/2018, showed no Leukemia, Lymphoma or evidence of Cancer. There was no obvious Dysplasia. 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  -Continue to monitor CBC  Suspected autoimmune disease -Possibly lupus, Sjogren's, autoimmune hepatitis -LDH 1063, CRP 1.1, ESR 65 -ANA positive, double-stranded DNA 135, ENA SM antibody greater than 8, chromatin antibody greater than 8, ribonucleic protein 7.6, SSA antibody IgG greater than 8, SSB IgG G antibiogram than  8 -Concern for autoimmune disorders -F-actin IgG was 37 -Patient started on hydroxychloroquine 21m twice daily, prednisone 1 mg/kg daily -Patient  will need to follow-up with rheumatology as an outpatient -s/p bone marrow biopsy- as above  Hypophosphatemia/ Hypomagnesemia/ Hypokalemia/ Hypocalcemia -improved, continue to monitor  Bipolar 1 disorder versus other underlying psychiatric disorder -Does not appear to be manic at this time -Continue aripiprazole, escitalopram  Underweight -Continue Ensure -Nutrition consulted  DVT Prophylaxis  SCDs  Code Status: Full  Family Communication: None at bedside  Disposition Plan: Admitted. Pending biopsy results and SNF  Consultants Hematology/Oncology Gastroenterology Interventional radiology Rheumatology, via phone, Dr. Lenetta Quaker [name] (by Dr. Raiford Noble)  Procedures  RUQ [place] MRCP CT guided bone marrow biopsy  US biopsy liver  Antibiotics   Anti-infectives (From admission, onward)   Start     Dose/Rate Route Frequency Ordered Stop   06/03/18 1000  hydroxychloroquine (PLAQUENIL) tablet 200 mg     200 mg Oral 2 times daily 06/03/18 0812     06/01/18 1730  vancomycin (VANCOCIN) IVPB 750 mg/150 ml premix  Status:  Discontinued     750 mg 150 mL/hr over 60 Minutes Intravenous Every 24 hours 05/31/18 1629 06/04/18 1125   05/31/18 1700  ceFEPIme (MAXIPIME) 1 g in sodium chloride 0.9 % 100 mL IVPB  Status:  Discontinued     1 g 200 mL/hr over 30 Minutes Intravenous Every 12 hours 05/31/18 1616 06/04/18 1125   05/31/18 1630  vancomycin (VANCOCIN) IVPB 1000 mg/200 mL premix     1,000 mg 200 mL/hr over 60 Minutes Intravenous  Once 05/31/18 1616 05/31/18 1810      Subjective:   Aleece Meland seen and examined today.  No complaints this morning.  States she feels weak however improving mildly.  Unable to open her drink or cut up her breakfast this morning.  Denies current chest pain, shortness of breath, abdominal pain, nausea or  vomiting, diarrhea or constipation, dizziness or headache.  Objective:   Vitals:   06/10/18 1313 06/10/18 1700 06/10/18 2133 06/11/18 0537  BP: (!) 87/74 (!) 102/58 103/72 106/71  Pulse: 91  73 80  Resp: '18  18 17  ' Temp: 98.5 F (36.9 C)  98.2 F (36.8 C) 98.1 F (36.7 C)  TempSrc: Oral  Oral Oral  SpO2: 100%  100% 97%  Weight:      Height:        Intake/Output Summary (Last 24 hours) at 06/11/2018 1043 Last data filed at 06/11/2018 0538 Gross per 24 hour  Intake 450 ml  Output 1000 ml  Net -550 ml   Filed Weights   05/30/18 0854 06/06/18 0956  Weight: 46.3 kg 46.3 kg   Exam  General: Well developed, chronically ill-appearing, NAD  HEENT: NCAT, mucous membranes moist.   Cardiovascular: S1 S2 auscultated, no murmurs, RRR  Respiratory: Clear to auscultation bilaterally with equal chest rise  Abdomen: Soft, nontender, nondistended, + bowel sounds  Extremities: warm dry without cyanosis clubbing or edema  Neuro: AAOx3, nonfocal  Psych: Flat affect, depressed mood  Data Reviewed: I have personally reviewed following labs and imaging studies  CBC: Recent Labs  Lab 06/05/18 0553 06/06/18 0548 06/07/18 0620 06/08/18 0607 06/09/18 0614 06/10/18 0551  WBC 3.5* 2.7* 3.2* 4.2 3.4* 3.6*  NEUTROABS 1.3*  --   --  2.3 1.7 2.2  HGB 9.7* 8.9* 8.7* 8.5* 9.1* 9.1*  HCT 31.0* 28.8* 28.2* 27.6* 29.6* 29.9*  MCV 90.6 90.9 91.6 93.2 90.5 92.3  PLT 90* 102* 108* 139* 158 644*   Basic Metabolic Panel: Recent Labs  Lab 06/06/18 0548 06/07/18 0620 06/08/18 0607 06/09/18 0614 06/10/18  0551  NA 144 146* 143 142 141  K 4.0 3.5 3.4* 3.9 4.4  CL 113* 111 111 108 103  CO2 '28 30 28 31 30  ' GLUCOSE 82 90 83 80 85  BUN '12 11 9 10 17  ' CREATININE 0.50 0.42* 0.49 0.52 0.65  CALCIUM 7.8* 8.0* 8.0* 8.2* 8.3*  MG  --   --  2.0 2.2 2.2  PHOS  --   --  2.6 3.5 5.8*   GFR: Estimated Creatinine Clearance: 59.4 mL/min (by C-G formula based on SCr of 0.65 mg/dL). Liver Function  Tests: Recent Labs  Lab 06/06/18 0548 06/07/18 0620 06/08/18 0607 06/09/18 0614 06/10/18 0551  AST 350* 354* 382* 422* 420*  ALT 59* 69* 88* 112* 129*  ALKPHOS 357* 422* 405* 396* 366*  BILITOT 0.3 0.4 0.6 0.7 0.7  PROT 6.1* 6.3* 6.4* 6.9 7.1  ALBUMIN 1.7* 1.8* 1.9* 2.2* 2.2*   No results for input(s): LIPASE, AMYLASE in the last 168 hours. No results for input(s): AMMONIA in the last 168 hours. Coagulation Profile: No results for input(s): INR, PROTIME in the last 168 hours. Cardiac Enzymes: Recent Labs  Lab 06/05/18 0553  CKTOTAL 165   BNP (last 3 results) No results for input(s): PROBNP in the last 8760 hours. HbA1C: No results for input(s): HGBA1C in the last 72 hours. CBG: No results for input(s): GLUCAP in the last 168 hours. Lipid Profile: No results for input(s): CHOL, HDL, LDLCALC, TRIG, CHOLHDL, LDLDIRECT in the last 72 hours. Thyroid Function Tests: No results for input(s): TSH, T4TOTAL, FREET4, T3FREE, THYROIDAB in the last 72 hours. Anemia Panel: No results for input(s): VITAMINB12, FOLATE, FERRITIN, TIBC, IRON, RETICCTPCT in the last 72 hours. Urine analysis:    Component Value Date/Time   COLORURINE YELLOW 05/24/2018 0111   APPEARANCEUR HAZY (A) 05/24/2018 0111   LABSPEC 1.017 05/24/2018 0111   PHURINE 5.0 05/24/2018 0111   GLUCOSEU NEGATIVE 05/24/2018 0111   HGBUR NEGATIVE 05/24/2018 0111   BILIRUBINUR NEGATIVE 05/24/2018 0111   KETONESUR NEGATIVE 05/24/2018 0111   PROTEINUR NEGATIVE 05/24/2018 0111   UROBILINOGEN 0.2 02/27/2014 0501   NITRITE NEGATIVE 05/24/2018 0111   LEUKOCYTESUR SMALL (A) 05/24/2018 0111   Sepsis Labs: '@LABRCNTIP' (procalcitonin:4,lacticidven:4)  ) No results found for this or any previous visit (from the past 240 hour(s)).    Radiology Studies: US Biopsy (liver)  Result Date: 06/09/2018 CLINICAL DATA:  Pancytopenia, transaminitis EXAM: ULTRASOUND-GUIDED CORE LIVER BIOPSY TECHNIQUE: An ultrasound guided liver biopsy  was thoroughly discussed with the patient and questions were answered. The benefits, risks, alternatives, and complications were also discussed. The patient understands and wishes to proceed with the procedure. A verbal as well as written consent was obtained. Survey ultrasound of the liver was performed and an appropriate skin entry site was determined. Skin site was marked, prepped with Betadine, and draped in usual sterile fashion, and infiltrated locally with 1% lidocaine. Intravenous Fentanyl and Versed were administered as conscious sedation during continuous monitoring of the patient's level of consciousness and physiological / cardiorespiratory status by the radiology RN, with a total moderate sedation time of 10 minutes. A 17 gauge trocar needle was advanced under ultrasound guidance into the liver. 3 coaxial 18gauge core samples were then obtained through the guide needle. The guide needle was removed. Post procedure scans demonstrate no apparent complication. COMPLICATIONS: COMPLICATIONS None immediate FINDINGS: Survey ultrasound of the liver demonstrates no focal lesion or biliary ductal dilatation. Representative 18 gauge core biopsy samples obtained as above. IMPRESSION: 1. Technically successful  ultrasound guided core liver biopsy. Electronically Signed   By: Lucrezia Europe M.D.   On: 06/09/2018 16:08     Scheduled Meds: . ARIPiprazole  15 mg Oral Daily  . escitalopram  5 mg Oral Daily  . feeding supplement (ENSURE ENLIVE)  237 mL Oral BID BM  . folic acid  1 mg Oral Daily  . hydroxychloroquine  200 mg Oral BID  . metoprolol tartrate  25 mg Oral BID  . multivitamin with minerals  1 tablet Oral Daily  . predniSONE  50 mg Oral Q breakfast  . thiamine  100 mg Oral Daily   Or  . thiamine  100 mg Intravenous Daily   Continuous Infusions:    LOS: 12 days   Time Spent in minutes   30 minutes   Teneisha Gignac D.O. on 06/11/2018 at 10:43 AM  Between 7am to 7pm - Please see pager noted on  [email or web address]  After 7pm go to [email or web address]  And look for the night coverage person covering for me after hours  [facility] Office  [phone]

## 2018-06-11 NOTE — Progress Notes (Signed)
Gastroenterology Inpatient Follow-up Note   PATIENT IDENTIFICATION  Yvonne [name] is a 52 y.o. female with a pmh significant for Bipolar 1 who presented with Pancytopenia and Abnormal LFTs of unclear etiology. Hospital Day: 13  SUBJECTIVE  No significant changes since my last evaluation of the patient. Liver biopsy is pending still which was performed on Friday due to patient desire to not have it done before Thanksgiving. Patient remains on prednisone equivalent greater than 40 mg daily which would be normal initiation point for autoimmune hepatitis. Liver biochemical tests continue to show elevation in her transferases as well as an alkaline phosphatase however bilirubin remains normal. No new fevers or chills of been noted.   OBJECTIVE  Scheduled Inpatient Medications:  . ARIPiprazole  15 mg Oral Daily  . escitalopram  5 mg Oral Daily  . feeding supplement (ENSURE ENLIVE)  237 mL Oral BID BM  . folic acid  1 mg Oral Daily  . hydroxychloroquine  200 mg Oral BID  . metoprolol tartrate  25 mg Oral BID  . multivitamin with minerals  1 tablet Oral Daily  . predniSONE  50 mg Oral Q breakfast  . thiamine  100 mg Oral Daily   Or  . thiamine  100 mg Intravenous Daily   Continuous Inpatient Infusions:  PRN Inpatient Medications: hydrOXYzine, ibuprofen, naLOXone (NARCAN)  injection, ondansetron **OR** ondansetron (ZOFRAN) IV, polyethylene glycol   Physical Examination  Temp:  [98.1 F (36.7 C)-98.5 F (36.9 C)] 98.1 F (36.7 C) (12/01 0537) Pulse Rate:  [73-91] 80 (12/01 0537) Resp:  [17-18] 17 (12/01 0537) BP: (87-106)/(58-74) 106/71 (12/01 0537) SpO2:  [97 %-100 %] 97 % (12/01 0537) Temp (24hrs), Avg:98.3 F (36.8 C), Min:98.1 F (36.7 C), Max:98.5 F (36.9 C)  Weight: 46.3 kg GEN: Fatigued, resting in bed PSYCH: Cooperative EYE: Conjunctivae pale-pink ENT: MMM CV: Non-tachycardic RESP: Decreased BS at the bases bilaterally GI: Soft, NT, NABS SKIN:  No  jaundice NEURO:  Alert & Oriented x 3   Review of Data   Laboratory Studies   Recent Labs  Lab 06/08/18 0607 06/09/18 0614 06/10/18 0551  NA 143 142 141  K 3.4* 3.9 4.4  CL 111 108 103  CO2 28 31 30   BUN 9 10 17   CREATININE 0.49 0.52 0.65  GLUCOSE 83 80 85  CALCIUM 8.0* 8.2* 8.3*  MG 2.0 2.2 2.2  PHOS 2.6 3.5 5.8*   Recent Labs  Lab 06/10/18 0551  AST 420*  ALT 129*  ALKPHOS 366*    Recent Labs  Lab 06/08/18 0607 06/09/18 0614 06/10/18 0551  WBC 4.2 3.4* 3.6*  HGB 8.5* 9.1* 9.1*  HCT 27.6* 29.6* 29.9*  PLT 139* 158 145*   No results for input(s): APTT, INR in the last 168 hours.  Imaging Studies  No new studies to review  GI Procedures and Studies  No relevant studies to review   ASSESSMENT  [name] is a 52 y.o. female  with a pmh significant for Bipolar 1 who presented with Pancytopenia and Abnormal LFTs of unclear etiology.  The patient is hemodynamic stable but the etiology of her biochemical testing remains unclear.  She had a positive anti-smooth muscle antibody like pathology biopsies consistent with concern for autoimmune hepatitis processes concomitant with her other body processes.  Drug/Toxin induced.  She previously had a negative CMV and EBV PCR making this much less likely.  I do not see HSV having been done and so I will send a HSV IgM/IgG antibodies to  ensure we are not missing a herpes etiology for her liver biochemical testing abnormalities however I suspect not that she has not had any new rashes consistent with herpes.  Other laboratories including hepatitis A and hepatitis B ensure immunization status will be sent tomorrow.  Would also like to rule out celiac disease although this would be extremely unlikely middle-aged woman at these levels will be sent as well.  We could consider giving the patient IV Solu-Medrol as a means of ensuring she is getting steroids into her body is not showing is evidence of being unable to tolerate oral  ingestion of prednisone so for today I would hold again while we are hopefully get her liver biopsy results by Monday.   PLAN/RECOMMENDATIONS  Await Liver Biopsy If still awaiting Liver biopsy on Monday, would consider giving a dose of IV Solumedrol 40 mg BID to see if we can ensure she is getting enough steroids should this be an AIH that is aggressive Drug  Additional labs ordered for tomorrow   Please page/call with questions or concerns.   Corliss ParishGabriel Mansouraty, MD [facility] Office # [phone]    LOS: 12 days  Lemar LoftyGabriel Mansouraty Jr  06/11/2018, 12:57 PM

## 2018-06-12 DIAGNOSIS — R7989 Other specified abnormal findings of blood chemistry: Secondary | ICD-10-CM

## 2018-06-12 DIAGNOSIS — R945 Abnormal results of liver function studies: Secondary | ICD-10-CM

## 2018-06-12 DIAGNOSIS — D61818 Other pancytopenia: Secondary | ICD-10-CM

## 2018-06-12 LAB — COMPREHENSIVE METABOLIC PANEL
ALBUMIN: 2.5 g/dL — AB (ref 3.5–5.0)
ALT: 128 U/L — ABNORMAL HIGH (ref 0–44)
AST: 327 U/L — AB (ref 15–41)
Alkaline Phosphatase: 337 U/L — ABNORMAL HIGH (ref 38–126)
Anion gap: 8 (ref 5–15)
BUN: 16 mg/dL (ref 6–20)
CO2: 32 mmol/L (ref 22–32)
Calcium: 8.7 mg/dL — ABNORMAL LOW (ref 8.9–10.3)
Chloride: 103 mmol/L (ref 98–111)
Creatinine, Ser: 0.61 mg/dL (ref 0.44–1.00)
GFR calc Af Amer: >60 mL/min (ref >60)
GFR calc non Af Amer: >60 mL/min (ref >60)
Glucose, Bld: 77 mg/dL (ref 70–99)
Potassium: 3.7 mmol/L (ref 3.5–5.1)
Sodium: 143 mmol/L (ref 135–145)
Total Bilirubin: 0.8 mg/dL (ref 0.3–1.2)
Total Protein: 7.4 g/dL (ref 6.5–8.1)

## 2018-06-12 LAB — SEROTONIN RELEASE ASSAY (SRA)
SRA .2 IU/mL UFH Ser-aCnc: 43 % — ABNORMAL HIGH (ref 0–20)
SRA 100IU/mL UFH Ser-aCnc: 19 % (ref 0–20)

## 2018-06-12 LAB — CBC
HCT: 30.4 % — ABNORMAL LOW (ref 36.0–46.0)
Hemoglobin: 9.5 g/dL — ABNORMAL LOW (ref 12.0–15.0)
MCH: 28.4 pg (ref 26.0–34.0)
MCHC: 31.3 g/dL (ref 30.0–36.0)
MCV: 91 fL (ref 80.0–100.0)
NRBC: 1.5 % — AB (ref 0.0–0.2)
Platelets: 187 10*3/uL (ref 150–400)
RBC: 3.34 MIL/uL — ABNORMAL LOW (ref 3.87–5.11)
RDW: 17 % — ABNORMAL HIGH (ref 11.5–15.5)
WBC: 3.4 10*3/uL — ABNORMAL LOW (ref 4.0–10.5)

## 2018-06-12 LAB — PROTIME-INR
INR: 0.97
Prothrombin Time: 12.8 seconds (ref 11.4–15.2)

## 2018-06-12 LAB — PREALBUMIN: Prealbumin: 17.6 mg/dL — ABNORMAL LOW (ref 18–38)

## 2018-06-12 NOTE — Progress Notes (Addendum)
     Yvonne [name] Progress Note   Chief Complaint:   Abnormal liver tests   SUBJECTIVE:   overall feels slightly stronger than when admitted. Extremity weakness improving. No abdominal pain. Working with PT.    ASSESSMENT AND PLAN:   1. Abnormal liver tests, not much change overnight.  -awaiting liver biopsy results.   2. Sepsis, unclear etiology. Pancytopenia, improving.   3. Suspected autoimmune disease.  Possibly lupus?. Still evaluating her for autoimmune hepatitis. No cancers on bone marrow biopsy -on prednisone and plaquenil  4. Malnutrition. Eating "some". Supplementing with Ensure. Nutritionist to see.  5. Bipolar disorder.    OBJECTIVE:     Vital signs in last 24 hours: Temp:  [97.6 F (36.4 C)-98.3 F (36.8 C)] 97.6 F (36.4 C) (12/02 0526) Pulse Rate:  [69-79] 69 (12/02 0526) Resp:  [16-18] 16 (12/02 0526) BP: (103-124)/(73-77) 124/73 (12/02 0526) SpO2:  [99 %-100 %] 100 % (12/02 0526) Last BM Date: 06/07/18 General:   Alert, thin female in NAD EENT:  Normal hearing, non icteric sclera, conjunctive pink.  Heart:  Regular rate and rhythm; no murmur.  No lower extremity edema   Pulm: Normal respiratory effort, lungs CTA bilaterally without wheezes or crackles. Abdomen:  Soft, nondistended, nontender.  Normal bowel sounds, no masses felt.  Neurologic:  Alert and  oriented x4;  grossly normal neurologically. Psych:  cooperative.  Flat affect.   Intake/Output from previous day: 12/01 0701 - 12/02 0700 In: 120 [P.O.:120] Out: 925 [Urine:925] Intake/Output this shift: Total I/O In: 180 [P.O.:180] Out: 425 [Urine:425]  Lab Results: Recent Labs    06/10/18 0551 06/12/18 0607  WBC 3.6* 3.4*  HGB 9.1* 9.5*  HCT 29.9* 30.4*  PLT 145* 187   BMET Recent Labs    06/10/18 0551 06/12/18 0607  NA 141 143  K 4.4 3.7  CL 103 103  CO2 30 32  GLUCOSE 85 77  BUN 17 16  CREATININE 0.65 0.61  CALCIUM 8.3* 8.7*   LFT Recent Labs   06/12/18 0607  PROT 7.4  ALBUMIN 2.5*  AST 327*  ALT 128*  ALKPHOS 337*  BILITOT 0.8   PT/INR Recent Labs    06/12/18 0607  LABPROT 12.8  INR 0.97   Principal Problem:   Orthostatic hypotension Active Problems:   Bipolar I disorder, current or most recent episode manic, with psychotic features (HCC)   Severe protein-calorie malnutrition (HCC)   Hypocalcemia   Transaminitis   Fever   Thrombocytopenia ([place])     LOS: 13 days   Yvonne [name] ,NP 06/12/2018, 10:44 AM  GI ATTENDING  Interval history and data reviewed.  Case discussed with Dr. Lia Foyer.  The patient is stable.  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  Liver tests have stabilized and her coagulation parameters remain normal.  Await liver biopsy results.  No additional recommendations at this time.  Yvonne [name]., M.D. [name]

## 2018-06-12 NOTE — Progress Notes (Signed)
Physical Therapy Treatment Patient Details Name: Yvonne [name] MRN: [id number] DOB: [birth year] Today's Date: 06/12/2018    History of Present Illness 52 yo female presented to ED after a fall. Found to have orthostatic hypotension, fever, sepsis-unclear etiology, possible autoimmune disease. Patient resides in a group home.      PT Comments    Assisted OOB to amb required increased time and repeat functional VC's to complete.    Follow Up Recommendations  SNF     Equipment Recommendations  None recommended by PT    Recommendations for Other Services       Precautions / Restrictions Precautions Precautions: Fall Restrictions Weight Bearing Restrictions: No    Mobility  Bed Mobility Overal bed mobility: Needs Assistance Bed Mobility: Supine to Sit;Sit to Supine     Supine to sit: Min assist Sit to supine: Mod assist   General bed mobility comments: Increased time. Assist to steady trunk during transiton.   Transfers Overall transfer level: Needs assistance Equipment used: Rolling walker (2 wheeled) Transfers: Sit to/from Stand Sit to Stand: Min assist Stand pivot transfers: Min assist       General transfer comment: Assist to rise, stabilize, control descent. VCs safety, technique, hand placement.   Ambulation/Gait Ambulation/Gait assistance: Min assist Gait Distance (Feet): 28 Feet Assistive device: Rolling walker (2 wheeled) Gait Pattern/deviations: Step-through pattern;Decreased stride length;Decreased step length - left;Decreased step length - right Gait velocity: decreased   General Gait Details: Assist to steady pt throughout distance. VCs safety. Pt fatigues fairly easily.   Stairs             Wheelchair Mobility    Modified Rankin (Stroke Patients Only)       Balance                                            Cognition Arousal/Alertness: Awake/alert Behavior During Therapy: Flat affect(child like ) Overall  Cognitive Status: No family/caregiver present to determine baseline cognitive functioning Area of Impairment: Attention;Problem solving                 Orientation Level: Disoriented to;Person             General Comments: improved attention (sustained) and follows one step commands       Exercises      General Comments        Pertinent Vitals/Pain Pain Assessment: No/denies pain    Home Living                      Prior Function            PT Goals (current goals can now be found in the care plan section) Progress towards PT goals: Progressing toward goals    Frequency    Min 2X/week      PT Plan Current plan remains appropriate    Co-evaluation PT/OT/SLP Co-Evaluation/Treatment: Yes            AM-PAC PT "6 Clicks" Mobility   Outcome Measure  Help needed turning from your back to your side while in a flat bed without using bedrails?: A Little Help needed moving from lying on your back to sitting on the side of a flat bed without using bedrails?: A Little Help needed moving to and from a bed to a chair (including a wheelchair)?: A Little Help needed standing up  from a chair using your arms (e.g., wheelchair or bedside chair)?: A Little Help needed to walk in hospital room?: A Little Help needed climbing 3-5 steps with a railing? : A Lot 6 Click Score: 17    End of Session Equipment Utilized During Treatment: Gait belt Activity Tolerance: Patient limited by fatigue Patient left: in bed;with call bell/phone within reach Nurse Communication: Mobility status PT Visit Diagnosis: Muscle weakness (generalized) (M62.81)     Time: [id number] PT Time Calculation (min) (ACUTE ONLY): 18 min  Charges:  $Gait Training: 8-22 mins                     {Reginna Sermeno  PTA Acute  [facility]habilitation [facility] Pager      [phone] Office      [phone]

## 2018-06-12 NOTE — Progress Notes (Signed)
PROGRESS NOTE    Yvonne [name]  SWN:[id number] DOB: 1966/05/19 DOA: 05/30/2018 PCP: Leonard Downing, MD   Brief Narrative:  HPI on 05/30/2018 by Dr. Gevena Barre Yvonne [name] is a 52 y.o. female with medical history significant for bipolar disorder who resides in a group home and presented to the emergency room after having a fall following lightheadedness.  In the emergency room, noted to have significant orthostatic hypotension.White count normal, although patient noted to have some mild transaminitis.  Mildly elevated lipase level although she does not report any abdominal pain.  Initially no evidence of fever.  Chest x-ray unremarkable.  Abdominal CT also unrevealing.  Hospitalist were called for further evaluation.  Following admission to the floor, patient spiked a fever of 101.4, remained tachycardic.  Lactic acid and procalcitonin level ordered.  Interim history Was admitted for orthostatic hypotension and found to have sepsis secondary to an unclear etiology.  She spiked temperature as well as became tachycardic, tachypnea with leukopenia.  Work-up thus far have returned no source.  She was noted to have positive autoimmune work-up and started on very low-dose prednisone.  Hematology consulted for leukopenia and thrombocytopenia.  Bone marrow biopsy planned for 06/05/2018.  GI also consulted for possible autoimmune hepatitis.S/p Bone marrow biopsy. S/p liver biopsy, pending results. SNF at discharge.   Assessment & Plan   Sepsis secondary to unclear etiology -Patient did have a temperature of 101.8, tachycardia, tachypnea and leukopenia -Sepsis physiology does appear to have improved -Patient was empirically started on vancomycin and cefepime, and has received 5 days of antibiotics, and discontinued.  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  Was placed on CIWA protocol empirically -Right upper quadrant ultrasound showed coarse echotexture of the hepatic parenchyma suggesting diffuse hepatocellular disease, possible hepatitis.  Alcoholic areas around the portal triads suggesting edema, potentially related obstruction secondary adenopathy at the porta hepatis region.  May need MRI for further evaluation for possible neoplasm. -UDS, alcohol level and salicylate level unremarkable on admission -Hepatitis panel unremarkable, Antimicrosomal antibody 0.8 -MRCP did not show any hepatic lesion.  No biliary duct dilatation or gallbladder sludge. -Gastroenterology consulted and appreciated, recommended liver biopsy -LFTs continue to worsen -IR consulted, s/p US liver biopsy, results pending  -Continue to monitor CMP -pending HSV, Hep A, B, IgG4   Orthostatic hypotension -In the setting of dehydration from poor oral intake as well as sepsis -Patient given 2 L normal saline along with IV fluid continuous -PT OT recommended SNF  Pancytopenia -Patient with thrombocytopenia, anemia and leukopenia -hemoglobin 9.1 and platelets 1425 (improving) -Hematology oncology consulted and appreciated -Bone marrow biopsy planned for 06/05/2018, showed no Leukemia, Lymphoma or evidence of Cancer. There was no obvious Dysplasia. 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  -Continue to monitor CBC  Suspected autoimmune disease -Possibly lupus, Sjogren's, autoimmune hepatitis -LDH 1063, CRP 1.1, ESR 65 -ANA positive, double-stranded DNA 135, ENA SM antibody greater than 8, chromatin antibody greater than 8, ribonucleic protein 7.6, SSA antibody IgG greater than  8, SSB IgG G antibiogram than 8 -Concern for autoimmune disorders -F-actin IgG was 37 -Patient started on hydroxychloroquine 13m  twice daily, prednisone 1 mg/kg daily -Patient will need to follow-up with rheumatology as an outpatient -s/p bone marrow biopsy- as above  Hypophosphatemia/ Hypomagnesemia/ Hypokalemia/ Hypocalcemia -improved, continue to monitor  Bipolar 1 disorder versus other underlying psychiatric disorder -Does not appear to be manic at this time -Continue aripiprazole, escitalopram  Underweight -Continue Ensure -Nutrition consulted  DVT Prophylaxis  SCDs  Code Status: Full  Family Communication: None at bedside  Disposition Plan: Admitted. Pending biopsy results, further GI recommendations, and SNF  Consultants Hematology/Oncology Gastroenterology Interventional radiology Rheumatology, via phone, Dr. Lenetta Quaker [name] (by Dr. Raiford Noble)  Procedures  RUQ [place] MRCP CT guided bone marrow biopsy  US biopsy liver  Antibiotics   Anti-infectives (From admission, onward)   Start     Dose/Rate Route Frequency Ordered Stop   06/03/18 1000  hydroxychloroquine (PLAQUENIL) tablet 200 mg     200 mg Oral 2 times daily 06/03/18 0812     06/01/18 1730  vancomycin (VANCOCIN) IVPB 750 mg/150 ml premix  Status:  Discontinued     750 mg 150 mL/hr over 60 Minutes Intravenous Every 24 hours 05/31/18 1629 06/04/18 1125   05/31/18 1700  ceFEPIme (MAXIPIME) 1 g in sodium chloride 0.9 % 100 mL IVPB  Status:  Discontinued     1 g 200 mL/hr over 30 Minutes Intravenous Every 12 hours 05/31/18 1616 06/04/18 1125   05/31/18 1630  vancomycin (VANCOCIN) IVPB 1000 mg/200 mL premix     1,000 mg 200 mL/hr over 60 Minutes Intravenous  Once 05/31/18 1616 05/31/18 1810      Subjective:   Yvonne [name] seen and examined today.  No complaints this morning.  Awaiting the results of her biopsy.  Denies current chest pain, shortness breath, abdominal pain, nausea or vomiting, diarrhea or  constipation, dizziness or headache.  Continues to feel weak.  Objective:   Vitals:   06/11/18 0537 06/11/18 1300 06/11/18 1942 06/12/18 0526  BP: 106/71 103/74 108/77 124/73  Pulse: 80 79 71 69  Resp: _0 Temp: 98.1 F (36.7 C) 98.3 F (36.8 C) 98.3 F (36.8 C) 97.6 F (36.4 C)  TempSrc: Oral Oral    SpO2: 97% 100% 99% 100%  Weight:      Height:        Intake/Output Summary (Last 24 hours) at 06/12/2018 1015 Last data filed at 06/12/2018 4782 Gross per 24 hour  Intake 180 ml  Output 1050 ml  Net -870 ml   Filed Weights   05/30/18 0854 06/06/18 0956  Weight: 46.3 kg 46.3 kg   Exam  General: Well developed, chronically ill-appearing, NAD  HEENT: NCAT, mucous membranes moist.   Neck: Supple  Cardiovascular: S1 S2 auscultated, RRR, no murmur  Respiratory: Clear to auscultation bilaterally with equal chest rise  Abdomen: Soft, nontender, nondistended, + bowel sounds  Extremities: warm dry without cyanosis clubbing or edema  Neuro: AAOx3, nonfocal  Psych: Flat affect, depressed mood  Data Reviewed: I have personally reviewed following labs and imaging studies  CBC: Recent Labs  Lab 06/07/18 0620 06/08/18 0607 06/09/18 0614 06/10/18 0551 06/12/18 0607  WBC 3.2* 4.2 3.4* 3.6* 3.4*  NEUTROABS  --  2.3 1.7 2.2  --   HGB 8.7* 8.5* 9.1* 9.1* 9.5*  HCT 28.2* 27.6* 29.6* 29.9* 30.4*  MCV 91.6 93.2 90.5 92.3 91.0  PLT 108* 139* 158 145* 956   Basic Metabolic Panel: Recent Labs  Lab 06/07/18 0620 06/08/18 0607 06/09/18 0614 06/10/18 0551 06/12/18 0607  NA 146*  143 142 141 143  K 3.5 3.4* 3.9 4.4 3.7  CL 111 111 108 103 103  CO2 _0 32  GLUCOSE 90 83 80 85 77  BUN _1 CREATININE 0.42* 0.49 0.52 0.65 0.61  CALCIUM 8.0* 8.0* 8.2* 8.3* 8.7*  MG  --  2.0 2.2 2.2  --   PHOS  --  2.6 3.5 5.8*  --    GFR: Estimated Creatinine Clearance: 59.4 mL/min (by C-G formula based on SCr of 0.61 mg/dL). Liver Function Tests: Recent  Labs  Lab 06/07/18 0620 06/08/18 0607 06/09/18 0614 06/10/18 0551 06/12/18 0607  AST 354* 382* 422* 420* 327*  ALT 69* 88* 112* 129* 128*  ALKPHOS 422* 405* 396* 366* 337*  BILITOT 0.4 0.6 0.7 0.7 0.8  PROT 6.3* 6.4* 6.9 7.1 7.4  ALBUMIN 1.8* 1.9* 2.2* 2.2* 2.5*   No results for input(s): LIPASE, AMYLASE in the last 168 hours. No results for input(s): AMMONIA in the last 168 hours. Coagulation Profile: Recent Labs  Lab 06/12/18 0607  INR 0.97   Cardiac Enzymes: No results for input(s): CKTOTAL, CKMB, CKMBINDEX, TROPONINI in the last 168 hours. BNP (last 3 results) No results for input(s): PROBNP in the last 8760 hours. HbA1C: No results for input(s): HGBA1C in the last 72 hours. CBG: No results for input(s): GLUCAP in the last 168 hours. Lipid Profile: No results for input(s): CHOL, HDL, LDLCALC, TRIG, CHOLHDL, LDLDIRECT in the last 72 hours. Thyroid Function Tests: No results for input(s): TSH, T4TOTAL, FREET4, T3FREE, THYROIDAB in the last 72 hours. Anemia Panel: No results for input(s): VITAMINB12, FOLATE, FERRITIN, TIBC, IRON, RETICCTPCT in the last 72 hours. Urine analysis:    Component Value Date/Time   COLORURINE YELLOW 05/24/2018 0111   APPEARANCEUR HAZY (A) 05/24/2018 0111   LABSPEC 1.017 05/24/2018 0111   PHURINE 5.0 05/24/2018 0111   GLUCOSEU NEGATIVE 05/24/2018 0111   HGBUR NEGATIVE 05/24/2018 0111   BILIRUBINUR NEGATIVE 05/24/2018 0111   KETONESUR NEGATIVE 05/24/2018 0111   PROTEINUR NEGATIVE 05/24/2018 0111   UROBILINOGEN 0.2 02/27/2014 0501   NITRITE NEGATIVE 05/24/2018 0111   LEUKOCYTESUR SMALL (A) 05/24/2018 0111   Sepsis Labs: _2 (procalcitonin:4,lacticidven:4)  ) No results found for this or any previous visit (from the past 240 hour(s)).    Radiology Studies: No results found.   Scheduled Meds: . ARIPiprazole  15 mg Oral Daily  . escitalopram  5 mg Oral Daily  . feeding supplement (ENSURE ENLIVE)  237 mL Oral BID BM  .  folic acid  1 mg Oral Daily  . hydroxychloroquine  200 mg Oral BID  . metoprolol tartrate  25 mg Oral BID  . multivitamin with minerals  1 tablet Oral Daily  . predniSONE  50 mg Oral Q breakfast  . thiamine  100 mg Oral Daily   Or  . thiamine  100 mg Intravenous Daily   Continuous Infusions:    LOS: 13 days   Time Spent in minutes   30 minutes   Saul Fabiano D.O. on 06/12/2018 at 10:15 AM  Between 7am to 7pm - Please see pager noted on [email or web address]  After 7pm go to [email or web address]  And look for the night coverage person covering for me after hours  [facility] Office  [phone]

## 2018-06-13 ENCOUNTER — Encounter: Payer: Self-pay | Admitting: Physician Assistant

## 2018-06-13 DIAGNOSIS — L8990 Pressure ulcer of unspecified site, unspecified stage: Secondary | ICD-10-CM

## 2018-06-13 LAB — HEPATITIS B SURFACE ANTIBODY, QUANTITATIVE: Hep B S AB Quant (Post): <3.1 m[IU]/mL — ABNORMAL LOW (ref >9.9)

## 2018-06-13 LAB — GLIADIN ANTIBODIES, SERUM
Antigliadin Abs, IgA: 11 units (ref 0–19)
Gliadin IgG: 4 units (ref 0–19)

## 2018-06-13 LAB — HSV(HERPES SIMPLEX VRS) I + II AB-IGG
HSV 1 Glycoprotein G Ab, IgG: <0.91 index (ref 0.00–0.90)
HSV 2 Glycoprotein G Ab, IgG: >23.6 index — ABNORMAL HIGH (ref 0.00–0.90)

## 2018-06-13 LAB — IGG 4: IgG, Subclass 4: 60 mg/dL (ref 2–96)

## 2018-06-13 LAB — TISSUE TRANSGLUTAMINASE, IGA: Tissue Transglutaminase Ab, IgA: <2 U/mL (ref 0–3)

## 2018-06-13 LAB — HEPATITIS A ANTIBODY, TOTAL: Hep A Total Ab: NEGATIVE

## 2018-06-13 NOTE — Progress Notes (Signed)
PROGRESS NOTE    Yvonne [name]  QJJ:[id number] DOB: [birth year] DOA: 05/30/2018 PCP: Leonard Downing, MD   Brief Narrative:  HPI on 05/30/2018 by Dr. Gevena Barre Yvonne [name] is a 52 y.o. female with medical history significant for bipolar disorder who resides in a group home and presented to the emergency room after having a fall following lightheadedness.  In the emergency room, noted to have significant orthostatic hypotension.White count normal, although patient noted to have some mild transaminitis.  Mildly elevated lipase level although she does not report any abdominal pain.  Initially no evidence of fever.  Chest x-ray unremarkable.  Abdominal CT also unrevealing.  Hospitalist were called for further evaluation.  Following admission to the floor, patient spiked a fever of 101.4, remained tachycardic.  Lactic acid and procalcitonin level ordered.  Interim history Was admitted for orthostatic hypotension and found to have sepsis secondary to an unclear etiology.  She spiked temperature as well as became tachycardic, tachypnea with leukopenia.  Work-up thus far have returned no source.  She was noted to have positive autoimmune work-up and started on very low-dose prednisone.  Hematology consulted for leukopenia and thrombocytopenia.  Bone marrow biopsy planned for 06/05/2018.  GI also consulted for possible autoimmune hepatitis.S/p Bone marrow biopsy. S/p liver biopsy, pending results. SNF at discharge.   Assessment & Plan   Sepsis secondary to unclear etiology -Patient did have a temperature of 101.8, tachycardia, tachypnea and leukopenia -Sepsis physiology does appear to have improved -Patient was empirically started on vancomycin and cefepime, and has received 5 days of antibiotics, and discontinued.  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  Was placed on CIWA protocol empirically -Right upper quadrant ultrasound showed coarse echotexture of the hepatic parenchyma suggesting diffuse hepatocellular disease, possible hepatitis.  Alcoholic areas around the portal triads suggesting edema, potentially related obstruction secondary adenopathy at the porta hepatis region.  May need MRI for further evaluation for possible neoplasm. -UDS, alcohol level and salicylate level unremarkable on admission -Hepatitis panel unremarkable, Antimicrosomal antibody 0.8 -MRCP did not show any hepatic lesion.  No biliary duct dilatation or gallbladder sludge. -Gastroenterology consulted and appreciated, recommended liver biopsy -LFTs mildly improving  -IR consulted, s/p US liver biopsy, results still pending  -Continue to monitor CMP -pending HSV and IgG4 pending -Hep A negative, Hep B shows immunity  Orthostatic hypotension -In the setting of dehydration from poor oral intake as well as sepsis -Patient given 2 L normal saline along with IV fluid continuous -PT OT recommended SNF  Pancytopenia -Patient with thrombocytopenia, anemia and leukopenia -hemoglobin 9.5 and platelets 187 (improving) -Hematology oncology consulted and appreciated -Bone marrow biopsy planned for 06/05/2018, showed no Leukemia, Lymphoma or evidence of Cancer. There was no obvious Dysplasia. Mild changes -Patient has been transfused 1 unit PRBC -Anemia panel showed iron 33, U IBC 82, TIBC 115, ferritin 5643, folate 9.3, vitamin B12 270 -Haptoglobin 27, HIT 0.713, SRA 43 -Continue to monitor CBC  Suspected autoimmune disease -Possibly lupus, Sjogren's, autoimmune hepatitis -LDH 1063, CRP 1.1, ESR 65 -ANA positive, double-stranded DNA 135, ENA SM antibody greater than 8, chromatin antibody greater than 8, ribonucleic protein 7.6, SSA  antibody IgG greater than 8, SSB IgG G antibiogram than 8 -Concern for autoimmune disorders -F-actin IgG was 37 -Patient started  on hydroxychloroquine 63m twice daily, prednisone 1 mg/kg daily -Patient will need to follow-up with rheumatology as an outpatient -s/p bone marrow biopsy- as above  Hypophosphatemia/ Hypomagnesemia/ Hypokalemia/ Hypocalcemia -improved, continue to monitor  Bipolar 1 disorder versus other underlying psychiatric disorder -Does not appear to be manic at this time -Continue aripiprazole, escitalopram  Underweight -Continue Ensure -Nutrition consulted  DVT Prophylaxis  SCDs  Code Status: Full  Family Communication: None at bedside  Disposition Plan: Admitted. Pending biopsy results, further GI recommendations, and SNF  Consultants Hematology/Oncology Gastroenterology Interventional radiology Rheumatology, via phone, Dr. GLenetta QuakerAryl (by Dr. ORaiford Noble  Procedures  RUQ [place] CT guided bone marrow biopsy  [place] liver  Antibiotics   Anti-infectives (From admission, onward)   Start     Dose/Rate Route Frequency Ordered Stop   06/03/18 1000  hydroxychloroquine (PLAQUENIL) tablet 200 mg     200 mg Oral 2 times daily 06/03/18 0812     06/01/18 1730  vancomycin (VANCOCIN) IVPB 750 mg/150 ml premix  Status:  Discontinued     750 mg 150 mL/hr over 60 Minutes Intravenous Every 24 hours 05/31/18 1629 06/04/18 1125   05/31/18 1700  ceFEPIme (MAXIPIME) 1 g in sodium chloride 0.9 % 100 mL IVPB  Status:  Discontinued     1 g 200 mL/hr over 30 Minutes Intravenous Every 12 hours 05/31/18 1616 06/04/18 1125   05/31/18 1630  vancomycin (VANCOCIN) IVPB 1000 mg/200 mL premix     1,000 mg 200 mL/hr over 60 Minutes Intravenous  Once 05/31/18 1616 05/31/18 1810      Subjective:   Yvonne [name] seen and examined today.  Has no complaints this morning of pain. Tired of being in the hospital. Denies current chest pain, shortness of breath, abdominal pain,  N/V/D/C, dizziness, headache. Would like to go to rehab.   Objective:   Vitals:   06/12/18 1330 06/12/18 2108 06/13/18 0529 06/13/18 0934  BP: 127/85 105/74 118/81 100/73  Pulse: 92 76 73 89  Resp: '16 16 15   ' Temp: 98.7 F (37.1 C) 98.2 F (36.8 C) 98.3 F (36.8 C)   TempSrc:      SpO2: 98% 100% 100%   Weight:      Height:        Intake/Output Summary (Last 24 hours) at 06/13/2018 0947 Last data filed at 06/13/2018 0909 Gross per 24 hour  Intake -  Output 900 ml  Net -900 ml   Filed Weights   05/30/18 0854 06/06/18 0956  Weight: 46.3 kg 46.3 kg   Exam  General: Well developed, chronically ill appearing,NAD  HEENT: NCAT, mucous membranes moist.   Cardiovascular: S1 S2 auscultated, no murmur, RRR  Respiratory: Clear to auscultation bilaterally with equal chest rise  Abdomen: Soft, nontender, nondistended, + bowel sounds  Extremities: warm dry without cyanosis clubbing or edema  Neuro: AAOx3, nonfocal  Skin: hand/palmer rash  Psych: Flat affect, depressed mood  Data Reviewed: I have personally reviewed following labs and imaging studies  CBC: Recent Labs  Lab 06/07/18 0620 06/08/18 0607 06/09/18 0614 06/10/18 0551 06/12/18 0607  WBC 3.2* 4.2 3.4* 3.6* 3.4*  NEUTROABS  --  2.3 1.7 2.2  --   HGB 8.7* 8.5* 9.1* 9.1* 9.5*  HCT 28.2* 27.6* 29.6* 29.9* 30.4*  MCV 91.6 93.2 90.5 92.3 91.0  PLT 108* 139* 158 145* 1993  Basic Metabolic Panel: Recent Labs  Lab 06/07/18 0620 06/08/18 0607 06/09/18 0614 06/10/18 0551 06/12/18 0607  NA 146* 143 142  141 143  K 3.5 3.4* 3.9 4.4 3.7  CL 111 111 108 103 103  CO2 '30 28 31 30 ' 32  GLUCOSE 90 83 80 85 77  BUN '11 9 10 17 16  ' CREATININE 0.42* 0.49 0.52 0.65 0.61  CALCIUM 8.0* 8.0* 8.2* 8.3* 8.7*  MG  --  2.0 2.2 2.2  --   PHOS  --  2.6 3.5 5.8*  --    GFR: Estimated Creatinine Clearance: 59.4 mL/min (by C-G formula based on SCr of 0.61 mg/dL). Liver Function Tests: Recent Labs  Lab 06/07/18 0620  06/08/18 0607 06/09/18 0614 06/10/18 0551 06/12/18 0607  AST 354* 382* 422* 420* 327*  ALT 69* 88* 112* 129* 128*  ALKPHOS 422* 405* 396* 366* 337*  BILITOT 0.4 0.6 0.7 0.7 0.8  PROT 6.3* 6.4* 6.9 7.1 7.4  ALBUMIN 1.8* 1.9* 2.2* 2.2* 2.5*   No results for input(s): LIPASE, AMYLASE in the last 168 hours. No results for input(s): AMMONIA in the last 168 hours. Coagulation Profile: Recent Labs  Lab 06/12/18 0607  INR 0.97   Cardiac Enzymes: No results for input(s): CKTOTAL, CKMB, CKMBINDEX, TROPONINI in the last 168 hours. BNP (last 3 results) No results for input(s): PROBNP in the last 8760 hours. HbA1C: No results for input(s): HGBA1C in the last 72 hours. CBG: No results for input(s): GLUCAP in the last 168 hours. Lipid Profile: No results for input(s): CHOL, HDL, LDLCALC, TRIG, CHOLHDL, LDLDIRECT in the last 72 hours. Thyroid Function Tests: No results for input(s): TSH, T4TOTAL, FREET4, T3FREE, THYROIDAB in the last 72 hours. Anemia Panel: No results for input(s): VITAMINB12, FOLATE, FERRITIN, TIBC, IRON, RETICCTPCT in the last 72 hours. Urine analysis:    Component Value Date/Time   COLORURINE YELLOW 05/24/2018 0111   APPEARANCEUR HAZY (A) 05/24/2018 0111   LABSPEC 1.017 05/24/2018 0111   PHURINE 5.0 05/24/2018 0111   GLUCOSEU NEGATIVE 05/24/2018 0111   HGBUR NEGATIVE 05/24/2018 0111   BILIRUBINUR NEGATIVE 05/24/2018 0111   KETONESUR NEGATIVE 05/24/2018 0111   PROTEINUR NEGATIVE 05/24/2018 0111   UROBILINOGEN 0.2 02/27/2014 0501   NITRITE NEGATIVE 05/24/2018 0111   LEUKOCYTESUR SMALL (A) 05/24/2018 0111   Sepsis Labs: '@LABRCNTIP' (procalcitonin:4,lacticidven:4)  ) No results found for this or any previous visit (from the past 240 hour(s)).    Radiology Studies: No results found.   Scheduled Meds: . ARIPiprazole  15 mg Oral Daily  . escitalopram  5 mg Oral Daily  . feeding supplement (ENSURE ENLIVE)  237 mL Oral BID BM  . folic acid  1 mg Oral Daily   . hydroxychloroquine  200 mg Oral BID  . metoprolol tartrate  25 mg Oral BID  . multivitamin with minerals  1 tablet Oral Daily  . predniSONE  50 mg Oral Q breakfast  . thiamine  100 mg Oral Daily   Or  . thiamine  100 mg Intravenous Daily   Continuous Infusions:    LOS: 14 days   Time Spent in minutes   30 minutes   Yvonne [name] D.O. on 06/13/2018 at 9:47 AM  Between 7am to 7pm - Please see pager noted on [email or web address]  After 7pm go to [email or web address]  And look for the night coverage person covering for me after hours  [facility] Office  [phone]

## 2018-06-13 NOTE — Progress Notes (Addendum)
     [facility] Progress Note  CC:  Abnormal liver tests  Subjective:  No vomiting, diarrhea, or abdominal pain.  Ate breakfast and waiting on lunch.  LFT's for today just ordered.  Objective:  Vital signs in last 24 hours: Temp:  [98.2 F (36.8 C)-98.7 F (37.1 C)] 98.3 F (36.8 C) (12/03 0529) Pulse Rate:  [73-92] 73 (12/03 0529) Resp:  [15-16] 15 (12/03 0529) BP: (87-127)/(62-85) 118/81 (12/03 0529) SpO2:  [98 %-100 %] 100 % (12/03 0529) Last BM Date: 06/12/18 General:  Alert, extremely thin, in NAD Heart:  Regular rate and rhythm; no murmurs Pulm:  CTAB.  No increased WOB. Abdomen:  Soft, non-distended.  BS present.  Non-tender. Extremities:  Without edema. Neurologic:  Alert and oriented x 4;  grossly normal neurologically. Psych:  Alert and cooperative. Flat affect.  Intake/Output from previous day: 12/02 0701 - 12/03 0700 In: 180 [P.O.:180] Out: 825 [Urine:825] Intake/Output this shift: Total I/O In: -  Out: 500 [Urine:500]  Lab Results: Recent Labs    06/12/18 0607  WBC 3.4*  HGB 9.5*  HCT 30.4*  PLT 187   BMET Recent Labs    06/12/18 0607  NA 143  K 3.7  CL 103  CO2 32  GLUCOSE 77  BUN 16  CREATININE 0.61  CALCIUM 8.7*   LFT Recent Labs    06/12/18 0607  PROT 7.4  ALBUMIN 2.5*  AST 327*  ALT 128*  ALKPHOS 337*  BILITOT 0.8   PT/INR Recent Labs    06/12/18 0607  LABPROT 12.8  INR 0.97   Assessment / Plan: 1. Abnormal liver tests, LFT's for today just ordered.  ? Autoimmune hepatitis.  ? Reactive due to sepsis.  -awaiting liver biopsy results and some other pending labs.  2. Sepsis, unclear etiology. Pancytopenia, improving.  CBC for today just ordered.   3. Suspected autoimmune disease.  Possibly lupus.  Still evaluating her for autoimmune hepatitis.  No cancers on bone marrow biopsy.  Coagulation parameters normal. -On prednisone and plaquenil.  4. Malnutrition. Eating better. Supplementing with  Ensure.  5. Bipolar disorder.     Follow-up made for our office on 12/11 at 9: 15 AM at which time she should have repeat LFT's.  Will follow-up liver biopsy results and pending labs at that time.   LOS: 14 days   Laban Emperor. Zehr  06/13/2018, 9:32 AM  GI ATTENDING  History, laboratories, interval events reviewed.  Agree with interval progress note. PATHOLOGY REPORT: I spoke with the pathologist this afternoon.  He states that the liver biopsy shows changes consistent with "acute hepatitis" possibly viral or drug.  NO evidence for chronicity and no changes to suggest autoimmune disease.  Fortunately, she is clinically stable.  Liver tests have stabilized.  Normal prothrombin time.  As such, I recommend the following:  1.  STOP STEROIDS 2.  NO GI MEDICATIONS on discharge 3.  Keep GI follow-up as arranged next week.  Her primary GI doctor assignment will be Dr. Fuller Plan.  Repeat liver tests at the time of her follow-up.  If no worse would continue to trend liver tests over time with the expectation of improvement.  Please call for questions.  Will sign off  Dauntae Derusha N. Geri Seminole., M.D. [facility] Division of Gastroenterology

## 2018-06-13 NOTE — Progress Notes (Signed)
Occupational Therapy Treatment Patient Details Name: Yvonne [name] MRN: [id number] DOB: 02-27-1966 Today's Date: 06/13/2018    History of present illness 52 yo female presented to ED after a fall. Found to have orthostatic hypotension, fever, sepsis-unclear etiology, possible autoimmune disease. Patient resides in a group home.     OT comments  Needed encouragement to get OOB as she had not been up this day.  Follow Up Recommendations  SNF          Precautions / Restrictions Restrictions Weight Bearing Restrictions: No       Mobility Bed Mobility Overal bed mobility: Needs Assistance Bed Mobility: Supine to Sit;Sit to Supine     Supine to sit: Min assist        Transfers Overall transfer level: Needs assistance Equipment used: 1 person hand held assist Transfers: Sit to/from Stand Sit to Stand: Min assist Stand pivot transfers: Min assist       General transfer comment: Assist to rise, stabilize, control descent. VCs safety, technique, hand placement.     Balance Overall balance assessment: Needs assistance;History of Falls Sitting-balance support: Feet supported;Bilateral upper extremity supported Sitting balance-Leahy Scale: Good     Standing balance support: Bilateral upper extremity supported;During functional activity Standing balance-Leahy Scale: Fair                             ADL either performed or assessed with clinical judgement   ADL Overall ADL's : Needs assistance/impaired Eating/Feeding: Set up;Sitting Eating/Feeding Details (indicate cue type and reason): sitting EOB Grooming: Wash/dry face;Sitting Grooming Details (indicate cue type and reason): sitting EOB                 Toilet Transfer: Minimal assistance;Stand-pivot Statistician Details (indicate cue type and reason): bed to chair-hand held A           General ADL Comments: Pt agreed to OOB to chair for early dinner.                 Cognition  Arousal/Alertness: Awake/alert Behavior During Therapy: Flat affect(child like ) Overall Cognitive Status: No family/caregiver present to determine baseline cognitive functioning                                               Frequency  Min 2X/week        Progress Toward Goals  OT Goals(current goals can now be found in the care plan section)        Plan Discharge plan remains appropriate       AM-PAC OT "6 Clicks" Daily Activity     Outcome Measure   Help from another person eating meals?: A Little Help from another person taking care of personal grooming?: A Little Help from another person toileting, which includes using toliet, bedpan, or urinal?: A Lot Help from another person bathing (including washing, rinsing, drying)?: A Lot Help from another person to put on and taking off regular upper body clothing?: A Little Help from another person to put on and taking off regular lower body clothing?: A Lot 6 Click Score: 15    End of Session Equipment Utilized During Treatment: Gait belt  OT Visit Diagnosis: Unsteadiness on feet (R26.81);Muscle weakness (generalized) (M62.81)   Activity Tolerance Patient tolerated treatment well   Patient Left in chair;with call bell/phone  within reach;with chair alarm set   Nurse Communication Mobility status        Time: [phone] OT Time Calculation (min): 14 min  Charges: OT General Charges $OT Visit: 1 Visit OT Treatments $Self Care/Home Management : 8-22 mins  Lise AuerLori Anmarie Fukushima, OT Acute Rehabilitation Services [phone] Office- [phone]      Deniese Oberry, Karin GoldenLorraine D 06/13/2018, 4:42 PM

## 2018-06-13 NOTE — Progress Notes (Signed)
Nutrition Follow-up  DOCUMENTATION CODES:   Underweight  INTERVENTION:   Continue Ensure Enlive po BID, each supplement provides 350 kcal and 20 grams of protein  NUTRITION DIAGNOSIS:   Inadequate oral intake related to (mental status) as evidenced by per patient/family report, meal completion < 25%.  Improving.  GOAL:   Patient will meet greater than or equal to 90% of their needs  Progressing.  MONITOR:   PO intake, Supplement acceptance, Labs, Weight trends, I & O's  ASSESSMENT:   52 y.o. female with medical history significant for bipolar disorder who resides in a group home and presented to the emergency room after having a fall following lightheadedness.  Patient currently consuming ~50% of meals and drinking at least 1 Ensure daily at this time. Recommend patient continue supplements after discharge.  No new weights measured since 11/26.   Labs reviewed. Medications: Folic acid tablet daily, Multivitamin with minerals daily, Thiamine tablet daily  Diet Order:   Diet Order            Diet regular Room service appropriate? Yes; Fluid consistency: Thin  Diet effective now              EDUCATION NEEDS:   Not appropriate for education at this time  Skin:  Skin Assessment: Skin Integrity Issues: Skin Integrity Issues:: Stage II Stage II: sacrum Other: pressure injury on sacrum  Last BM:  12/3  Height:   Ht Readings from Last 1 Encounters:  05/30/18 5\' 2"  (1.575 m)    Weight:   Wt Readings from Last 1 Encounters:  06/06/18 46.3 kg    Ideal Body Weight:  50 kg  BMI:  Body mass index is 18.67 kg/m.  Estimated Nutritional Needs:   Kcal:  1400-1600  Protein:  65-75g  Fluid:  1.6L/day   Tilda FrancoLindsey Nyshawn Gowdy, MS, RD, LDN [facility] Clinical Dietitian Pager: [phone] After Hours Pager: [phone]

## 2018-06-14 ENCOUNTER — Encounter (HOSPITAL_COMMUNITY): Payer: Self-pay | Admitting: Oncology

## 2018-06-14 LAB — COMPREHENSIVE METABOLIC PANEL
ALK PHOS: 311 U/L — AB (ref 38–126)
ALT: 129 U/L — ABNORMAL HIGH (ref 0–44)
ANION GAP: 7 (ref 5–15)
AST: 250 U/L — ABNORMAL HIGH (ref 15–41)
Albumin: 2.9 g/dL — ABNORMAL LOW (ref 3.5–5.0)
BUN: 21 mg/dL — ABNORMAL HIGH (ref 6–20)
CO2: 32 mmol/L (ref 22–32)
Calcium: 8.9 mg/dL (ref 8.9–10.3)
Chloride: 104 mmol/L (ref 98–111)
Creatinine, Ser: 0.59 mg/dL (ref 0.44–1.00)
GFR calc Af Amer: >60 mL/min (ref >60)
GFR calc non Af Amer: >60 mL/min (ref >60)
Glucose, Bld: 83 mg/dL (ref 70–99)
Potassium: 3.6 mmol/L (ref 3.5–5.1)
Sodium: 143 mmol/L (ref 135–145)
Total Bilirubin: 1 mg/dL (ref 0.3–1.2)
Total Protein: 7.5 g/dL (ref 6.5–8.1)

## 2018-06-14 LAB — RETICULIN ANTIBODIES, IGA W TITER: Reticulin Ab, IgA: NEGATIVE titer (ref <2.5)

## 2018-06-14 LAB — CBC
HCT: 31.1 % — ABNORMAL LOW (ref 36.0–46.0)
Hemoglobin: 9.5 g/dL — ABNORMAL LOW (ref 12.0–15.0)
MCH: 28 pg (ref 26.0–34.0)
MCHC: 30.5 g/dL (ref 30.0–36.0)
MCV: 91.7 fL (ref 80.0–100.0)
Platelets: 208 10*3/uL (ref 150–400)
RBC: 3.39 MIL/uL — ABNORMAL LOW (ref 3.87–5.11)
RDW: 17.3 % — ABNORMAL HIGH (ref 11.5–15.5)
WBC: 4 10*3/uL (ref 4.0–10.5)
nRBC: 1.5 % — ABNORMAL HIGH (ref 0.0–0.2)

## 2018-06-14 LAB — HSV(HERPES SIMPLEX VRS) I + II AB-IGM: HSVI/II Comb IgM: 1.32 Ratio — ABNORMAL HIGH (ref 0.00–0.90)

## 2018-06-14 MED ORDER — PREDNISONE 20 MG PO TABS
20.0000 mg | ORAL_TABLET | Freq: Every day | ORAL | Status: DC
  Administered 2018-06-15 – 2018-06-22 (×8): 20 mg via ORAL
  Filled 2018-06-14 (×8): qty 1

## 2018-06-14 MED ORDER — HYDROXYCHLOROQUINE SULFATE 200 MG PO TABS
200.0000 mg | ORAL_TABLET | Freq: Every day | ORAL | Status: DC
  Administered 2018-06-14 – 2018-06-22 (×9): 200 mg via ORAL
  Filled 2018-06-14 (×9): qty 1

## 2018-06-14 NOTE — Progress Notes (Signed)
PROGRESS NOTE    Zahniya Zellars  XJD:[id number] DOB: Sep 02, 1965 DOA: 05/30/2018 PCP: Leonard Downing, MD      Brief Narrative:  Mrs. Lipsey is a 52 y.o. F with bipolar disorder, lives in group home, who presented with lightheadedness and fall.  Found to have significant orthostatic hypotension.  Following admission to the floor, patient developed fever to 101F, tachycardia, started on empiric antibiotics.       Assessment & Plan:  Sepsis secondary to unclear etiology Patient did have a temperature of 101.8, tachycardia, tachypnea and leukopenia.  Treaqted empirically with vancomycin and cefepime, 5 days of antibiotics, discontinued and sepsis resolved.  Blood cultures show no growth to date, urine culture negative.  Hepatitis panel, influenza, CMV and EBV titers unremarkable.    Abnormal LFTs/transaminitis, autoimmune hepatitis ruled out Patient resides in a group home, unclear if she uses alcohol.  Was placed on CIWA protocol empirically.  Right upper quadrant ultrasound showed coarse echotexture of the hepatic parenchyma suggesting diffuse hepatocellular disease, possible hepatitis.  Alcoholic areas around the portal triads suggesting edema, potentially related obstruction secondary adenopathy at the porta hepatis region.  MRCP did not show any hepatic lesion.  No biliary duct dilatation or gallbladder sludge.  UDS, alcohol level and salicylate level unremarkable on admission. Hepatitis panel unremarkable, Antimicrosomal antibody 0.8 Gastroenterology consulted.  Underwent liver biopsy, results do not show inflammation consistent with AH.  Hep A negative, Hep B shows immunity.  Orthostatic hypotension Resolved.  Pancytopenia Patient with thrombocytopenia, anemia and leukopenia.  Stable.  Hematology oncology consulted. Bone marrow biopsy showed no Leukemia, Lymphoma or evidence of Cancer. There was no obvious Dysplasia. Patient has been transfused 1 unit PRBC.    Weakness CK  normal.  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  Suspected autoimmune disease Possibly lupus, Sjogren's.  LDH 1063, CRP 1.1, ESR 65, ANA positive, double-stranded DNA 135, ENA SM antibody greater than 8, chromatin antibody greater than 8, ribonucleic protein 7.6, SSA antibody IgG greater than 8, SSB IgG G antibiogram than 8.  ANA screen positive.  Markedly positive double-stranded DNA, anti-RNP, anti-Smith, SSA, SSB, anti-chromatin, anti-smooth muscle.  Negative anti-Jo, negative anticentromere, negative scleroderma.  Antimitochondrial antibodies negative.  Anti-tissue transglutaminase negative, anti-reticulin antibodies negative, IgG testing negative, antigliadin negative.  HSV-2 IgG positive.  Hepatitis A antibody negative, hepatitis B sAg.  Ceruloplasmin borderline low.  CMV IgM negative, EBV IgM negative.  DAT positive, haptoglobin low.  HIT screen positive, SRA weakly positive.  Anti-LK microsomal antibodies negative. -Continue hydroxychloroquine 200 mg, prednisone 20 mg -Patient will need to follow-up with rheumatology as an outpatient  Hypophosphatemia/ Hypomagnesemia/ Hypokalemia/ Hypocalcemia  Bipolar 1 disorder versus other underlying psychiatric disorder -Continue aripiprazole, escitalopram  Severe protein calorie malnutrition -Continue Ensure -Nutrition consulted  Hypertension -Continue metoprolol  Stage II sacrum, not POA      MDM and disposition: The below labs and imaging reports were reviewed and summarized above.  Medication management as above.  The patient was admitted with Fall, found to have sepsis.  Ultimately found to have a rheumatologic disease, started on prednisone.  Now Liver biopsy shows no autoimmune hepatitis.  The patient is significantly debilitated from her baseline.  She will require follow up with rheumatology.       DVT prophylaxis: SCDs Code Status: FULL Family Communication: NOne presnet    Consultants:    GI  Heme  Procedures:   Bone marrow biopsy  Liver biopsy  Antimicrobials:   Vanc  11/20 >> 11/23  CEfepime 11/20 >> 11/23   Subjective: Feeling tired, weak.  No new fever, confusion.  No vomiting, chest pain, dyspnea.  No joint paints.  Objective: Vitals:   06/13/18 1330 06/13/18 1939 06/14/18 0559 06/14/18 1103  BP: 103/65 111/75 128/77 91/63  Pulse: 93 67 75 91  Resp: _0 Temp: 98.2 F (36.8 C) 98.1 F (36.7 C) 98.6 F (37 C)   TempSrc: Oral Oral Oral   SpO2: 99% 99% 98%   Weight:      Height:        Intake/Output Summary (Last 24 hours) at 06/14/2018 1401 Last data filed at 06/14/2018 0701 Gross per 24 hour  Intake 120 ml  Output 525 ml  Net -405 ml   Filed Weights   05/30/18 0854 06/06/18 0956  Weight: 46.3 kg 46.3 kg    Examination: General appearance: thin adult female, alert and in no acute distress.   HEENT: Anicteric, conjunctiva pink, lids and lashes normal. No nasal deformity, discharge, epistaxis.  Lips moist.   Skin: Warm and dry.  no jaundice.  No suspicious rashes or lesions. Cardiac: RRR, nl S1-S2, no murmurs appreciated.  Capillary refill is brisk.  JVP not visible.  No LE edema.  Radia  pulses 2+ and symmetric. Respiratory: Normal respiratory rate and rhythm.  CTAB without rales or wheezes. Abdomen: Abdomen soft.   TTP not present. No ascites, distension, hepatosplenomegaly.   MSK: No deformities or effusions. Neuro: Awake and alert.  EOMI, globally very weak, unable to si up from bed.  Appears to have weak but symmetric 4/5 strength in legs, arms Speech fluent.    Psych: Sensorium intact and responding to questions, attention normal. Affect flat.  Judgment and insight appear normal.    Data Reviewed: I have personally reviewed following labs and imaging studies:  CBC: Recent Labs  Lab 06/08/18 0607 06/09/18 0614 06/10/18 0551 06/12/18 0607 06/14/18 0548  WBC 4.2 3.4* 3.6* 3.4* 4.0  NEUTROABS 2.3 1.7 2.2  --   --    HGB 8.5* 9.1* 9.1* 9.5* 9.5*  HCT 27.6* 29.6* 29.9* 30.4* 31.1*  MCV 93.2 90.5 92.3 91.0 91.7  PLT 139* 158 145* 187 937   Basic Metabolic Panel: Recent Labs  Lab 06/08/18 0607 06/09/18 0614 06/10/18 0551 06/12/18 0607 06/14/18 0548  NA 143 142 141 143 143  K 3.4* 3.9 4.4 3.7 3.6  CL 111 108 103 103 104  CO2 _1 32 32  GLUCOSE 83 80 85 77 83  BUN _2 21*  CREATININE 0.49 0.52 0.65 0.61 0.59  CALCIUM 8.0* 8.2* 8.3* 8.7* 8.9  MG 2.0 2.2 2.2  --   --   PHOS 2.6 3.5 5.8*  --   --    GFR: Estimated Creatinine Clearance: 59.4 mL/min (by C-G formula based on SCr of 0.59 mg/dL). Liver Function Tests: Recent Labs  Lab 06/08/18 0607 06/09/18 0614 06/10/18 0551 06/12/18 0607 06/14/18 0548  AST 382* 422* 420* 327* 250*  ALT 88* 112* 129* 128* 129*  ALKPHOS 405* 396* 366* 337* 311*  BILITOT 0.6 0.7 0.7 0.8 1.0  PROT 6.4* 6.9 7.1 7.4 7.5  ALBUMIN 1.9* 2.2* 2.2* 2.5* 2.9*   No results for input(s): LIPASE, AMYLASE in the last 168 hours. No results for input(s): AMMONIA in the last 168 hours. Coagulation Profile: Recent Labs  Lab 06/12/18 0607  INR 0.97   Cardiac Enzymes: No results for input(s): CKTOTAL, CKMB, CKMBINDEX, TROPONINI in the last 168 hours.  BNP (last 3 results) No results for input(s): PROBNP in the last 8760 hours. HbA1C: No results for input(s): HGBA1C in the last 72 hours. CBG: No results for input(s): GLUCAP in the last 168 hours. Lipid Profile: No results for input(s): CHOL, HDL, LDLCALC, TRIG, CHOLHDL, LDLDIRECT in the last 72 hours. Thyroid Function Tests: No results for input(s): TSH, T4TOTAL, FREET4, T3FREE, THYROIDAB in the last 72 hours. Anemia Panel: No results for input(s): VITAMINB12, FOLATE, FERRITIN, TIBC, IRON, RETICCTPCT in the last 72 hours. Urine analysis:    Component Value Date/Time   COLORURINE YELLOW 05/24/2018 0111   APPEARANCEUR HAZY (A) 05/24/2018 0111   LABSPEC 1.017 05/24/2018 0111   PHURINE 5.0 05/24/2018  0111   GLUCOSEU NEGATIVE 05/24/2018 0111   HGBUR NEGATIVE 05/24/2018 0111   BILIRUBINUR NEGATIVE 05/24/2018 0111   KETONESUR NEGATIVE 05/24/2018 0111   PROTEINUR NEGATIVE 05/24/2018 0111   UROBILINOGEN 0.2 02/27/2014 0501   NITRITE NEGATIVE 05/24/2018 0111   LEUKOCYTESUR SMALL (A) 05/24/2018 0111   Sepsis Labs: _0 (procalcitonin:4,lacticacidven:4)  )No results found for this or any previous visit (from the past 240 hour(s)).       Radiology Studies: No results found.      Scheduled Meds: . ARIPiprazole  15 mg Oral Daily  . escitalopram  5 mg Oral Daily  . feeding supplement (ENSURE ENLIVE)  237 mL Oral BID BM  . folic acid  1 mg Oral Daily  . metoprolol tartrate  25 mg Oral BID  . multivitamin with minerals  1 tablet Oral Daily  . thiamine  100 mg Oral Daily   Or  . thiamine  100 mg Intravenous Daily   Continuous Infusions:   LOS: 15 days    Time spent: 25 minutes    Edwin Dada, MD [facility] 06/14/2018, 2:01 PM     Please page through AMION:  [email or web address] If 7PM-7AM, please contact night-coverage

## 2018-06-14 NOTE — Progress Notes (Signed)
Physical Therapy Treatment Patient Details Name: Yvonne [name] MRN: [id number] DOB: 03/03/1966 Today's Date: 06/14/2018    History of Present Illness 52 yo female presented to ED after a fall. Found to have orthostatic hypotension, fever, sepsis-unclear etiology, possible autoimmune disease. Patient resides in a group home.      PT Comments    Pt tolerated increased ambulation distance of 160' with RW, no loss of balance, no dizziness, ambulating HR 105, no dyspnea.  Flat affect noted.   Follow Up Recommendations  SNF     Equipment Recommendations  None recommended by PT    Recommendations for Other Services       Precautions / Restrictions Precautions Precautions: Fall Restrictions Weight Bearing Restrictions: No    Mobility  Bed Mobility Overal bed mobility: Needs Assistance Bed Mobility: Supine to Sit     Supine to sit: Min assist     General bed mobility comments: min A to initiate movement towards edge of bed  Transfers Overall transfer level: Needs assistance Equipment used: Rolling walker (2 wheeled) Transfers: Sit to/from Stand Sit to Stand: Min assist         General transfer comment: Assist to rise, stabilize, control descent. VCs safety, technique, hand placement.   Ambulation/Gait Ambulation/Gait assistance: Min assist Gait Distance (Feet): 160 Feet Assistive device: Rolling walker (2 wheeled) Gait Pattern/deviations: Step-through pattern;Decreased stride length;Decreased step length - left;Decreased step length - right Gait velocity: decreased   General Gait Details: min A to manage RW esp with turns, no loss of balance, HR 105 ambulating, pt denied dizziness   Stairs             Wheelchair Mobility    Modified Rankin (Stroke Patients Only)       Balance Overall balance assessment: Needs assistance;History of Falls Sitting-balance support: Feet supported;Bilateral upper extremity supported Sitting balance-Leahy Scale: Good      Standing balance support: Bilateral upper extremity supported;During functional activity Standing balance-Leahy Scale: Fair                              Cognition Arousal/Alertness: Awake/alert Behavior During Therapy: Flat affect Overall Cognitive Status: No family/caregiver present to determine baseline cognitive functioning                               Problem Solving: Slow processing;Requires tactile cues        Exercises      General Comments        Pertinent Vitals/Pain Pain Assessment: No/denies pain    Home Living                      Prior Function            PT Goals (current goals can now be found in the care plan section) Acute Rehab PT Goals Patient Stated Goal: likes to do word searches PT Goal Formulation: With patient Time For Goal Achievement: 06/21/18 Potential to Achieve Goals: Fair Progress towards PT goals: Progressing toward goals    Frequency    Min 2X/week      PT Plan Current plan remains appropriate    Co-evaluation              AM-PAC PT "6 Clicks" Mobility   Outcome Measure  Help needed turning from your back to your side while in a flat bed without using bedrails?: A  Little Help needed moving from lying on your back to sitting on the side of a flat bed without using bedrails?: A Little Help needed moving to and from a bed to a chair (including a wheelchair)?: A Little Help needed standing up from a chair using your arms (e.g., wheelchair or bedside chair)?: A Little Help needed to walk in hospital room?: A Little Help needed climbing 3-5 steps with a railing? : A Lot 6 Click Score: 17    End of Session Equipment Utilized During Treatment: Gait belt Activity Tolerance: Patient tolerated treatment well Patient left: with call bell/phone within reach;in chair;with chair alarm set Nurse Communication: Mobility status(pt's bed soiled) PT Visit Diagnosis: Muscle weakness  (generalized) (M62.81)     Time: [id number] PT Time Calculation (min) (ACUTE ONLY): 18 min  Charges:  $Gait Training: 8-22 mins                     Ralene BatheUhlenberg, Pamela Intrieri Kistler PT 06/14/2018  Acute Rehabilitation Services Pager [phone] Office [phone]

## 2018-06-14 NOTE — Consult Note (Signed)
WOC Nurse wound consult note Patient receiving care in [facility].  No family present. Reason for Consult: Sacral wound.  Sacral foam in place at the time of my evaluation. Wound type: NO WOUND PRESENT. Helmut MusterSherry Nicole Hafley, RN, MSN, CWOCN, CNS-BC, pager [phone]

## 2018-06-15 NOTE — Progress Notes (Signed)
Physical Therapy Treatment Patient Details Name: Yvonne BalsamMelvine [name] MRN: [id number] DOB: Apr 06, 1966 Today's Date: 06/15/2018    History of Present Illness 52 yo female presented to ED after a fall. Found to have orthostatic hypotension, fever, sepsis-unclear etiology, possible autoimmune disease. Patient resides in a group home.      PT Comments    Nursing reports patient appears weaker today. Patient did  Appear to require more assistance for ambulation and mobility based on  Previous encounter. Continue PT.   Follow Up Recommendations  SNF     Equipment Recommendations  None recommended by PT    Recommendations for Other Services       Precautions / Restrictions Precautions Precautions: Fall Precaution Comments: has low BP Restrictions Weight Bearing Restrictions: No    Mobility  Bed Mobility   Bed Mobility: Supine to Sit     Supine to sit: Min assist Sit to supine: Mod assist   General bed mobility comments: assist with legs onto bed, extra time  Transfers Overall transfer level: Needs assistance Equipment used: Rolling walker (2 wheeled) Transfers: Sit to/from Stand Sit to Stand: Mod assist         General transfer comment: Assist to rise, stabilize, control descent. VCs safety, technique, hand placement.   Ambulation/Gait Ambulation/Gait assistance: Mod assist Gait Distance (Feet): 10 Feet(then 40, then 20' x 2) Assistive device: Rolling walker (2 wheeled) Gait Pattern/deviations: Step-to pattern;Step-through pattern;Staggering left;Staggering right;Scissoring;Shuffle;Narrow base of support Gait velocity: decreased   General Gait Details: mod assist to steady inside RW, scissors frequently.   Stairs             Wheelchair Mobility    Modified Rankin (Stroke Patients Only)       Balance Overall balance assessment: Needs assistance;History of Falls Sitting-balance support: Feet supported;Bilateral upper extremity supported Sitting  balance-Leahy Scale: Good     Standing balance support: Bilateral upper extremity supported;During functional activity Standing balance-Leahy Scale: Poor                              Cognition Arousal/Alertness: Awake/alert Behavior During Therapy: Flat affect Overall Cognitive Status: No family/caregiver present to determine baseline cognitive functioning Area of Impairment: Attention;Problem solving                             Problem Solving: Slow processing;Requires tactile cues General Comments: improved attention (sustained) and follows one step commands       Exercises      General Comments        Pertinent Vitals/Pain Faces Pain Scale: Hurts little more Pain Location: buttock Pain Intervention(s): Monitored during session    Home Living                      Prior Function            PT Goals (current goals can now be found in the care plan section) Progress towards PT goals: Progressing toward goals    Frequency    Min 2X/week      PT Plan Current plan remains appropriate    Co-evaluation              AM-PAC PT "6 Clicks" Mobility   Outcome Measure  Help needed turning from your back to your side while in a flat bed without using bedrails?: A Lot Help needed moving from lying on your back to  sitting on the side of a flat bed without using bedrails?: A Lot Help needed moving to and from a bed to a chair (including a wheelchair)?: A Lot Help needed standing up from a chair using your arms (e.g., wheelchair or bedside chair)?: A Lot Help needed to walk in hospital room?: A Lot Help needed climbing 3-5 steps with a railing? : Total 6 Click Score: 11    End of Session Equipment Utilized During Treatment: Gait belt Activity Tolerance: Patient tolerated treatment well Patient left: with call bell/phone within reach;in bed;with bed alarm set Nurse Communication: Mobility status PT Visit Diagnosis: Muscle  weakness (generalized) (M62.81)     Time: 1610-9604 PT Time Calculation (min) (ACUTE ONLY): 25 min  Charges:  $Gait Training: 8-22 mins $Self Care/Home Management: 8-22                     Blanchard Kelch PT Acute Rehabilitation Services Pager [phone] Office [phone]    Rada Hay 06/15/2018, 12:45 PM

## 2018-06-15 NOTE — Progress Notes (Signed)
Cytogenetics from bone marrow biopsy show normal karyotype, reinforcing inpression no myelodysplasia or other primary bone marrow problem.  WBC and platelets have normalized with plaquenil/ steroids.   Will sign off at this point. Please reconsult as needed.

## 2018-06-15 NOTE — Progress Notes (Signed)
PROGRESS NOTE    Yvonne [name]  EHM:[id number] DOB: 12-16-1965 DOA: 05/30/2018 PCP: Leonard Downing, MD      Brief Narrative:  Yvonne [name] is a 52 y.o. F with bipolar disorder, lives in group home, who presented with lightheadedness and fall.  Found to have significant orthostatic hypotension.  Following admission to the floor, patient developed fever to 101F, tachycardia, started on empiric antibiotics, unclear source.    After admission, it became clear that in addition to fever, patient was noted to have leukopenia, anemia, transaminitis, generalized weakness, and positive ANA, although without joint pain, rashes, evidence of serositis or any other focal findings of autoimmune disease.  GI were consulted for evaluation of autoimmune hepatitis.     Assessment & Plan:  Sepsis secondary to unclear etiology Patient did have a temperature of 101.8, tachycardia, tachypnea and leukopenia at presentation.  Treated empirically with vancomycin and cefepime, 5 days of antibiotics, discontinued and sepsis physiology appeared resolved.    Blood cultures show no growth to date, urine culture negative.  Hepatitis panel, influenza, CMV and EBV titers unremarkable.  Unclear source of infection.     Abnormal LFTs/transaminitis Autoimmune hepatitis ruled out Noted to have LFTs in 400s on admission.  Patient resides in a group home, unclear if she uses alcohol.  Was placed on CIWA protocol empirically.    Right upper quadrant ultrasound showed coarse echotexture of the hepatic parenchyma suggesting diffuse hepatocellular disease, possible hepatitis. MRCP did not show any hepatic lesion.  No biliary duct dilatation or gallbladder sludge.    UDS, alcohol level and salicylate level unremarkable on admission. Hepatitis serologies unremarkable, Antimicrosomal antibody 0.8.  Gastroenterology consulted.  Underwent liver biopsy, discussed with Pathology, the liver biopsy results do not show  inflammation consistent with AH, more consistent with a drug induced hepatitis, less likely a nonspecific autoimmune inflammation.  Abilify has not been reported to cause liver injury.  Lexapro causes liver injury in <1% of cases, making this very unlikely.  The only other likely contributor is that the patient was on (apparently) scheduled acetaminophen PTA.  Although her acetaminophen level was negative at admission, it is possible we have witnessed a Tylenol liver injury that preceded admission.  If this were the case, it would continue to resolve with holding acetaminophen and supportive care. -SNF will need to arrange GI follow up with Dr. Fuller Plan at [facility]   Pancytopenia Patient with thrombocytopenia, anemia and leukopenia.  Stable.  Hematology oncology consulted. Bone marrow biopsy showed no Leukemia, Lymphoma or evidence of Cancer.  Cytogenetics normal. No MDS. There was no obvious Dysplasia. Patient has been transfused 1 unit PRBC and counts remained stable.      Suspected autoimmune disease Early evaluation for hepatitis included ANA, which was positive, subsequent work up showed:  LDH 1063 CRP 1.1, ESR 65 Markedly positive double-stranded DNA, anti-RNP, anti-Smith, SSA, SSB, anti-chromatin, anti-smooth muscle.   Negative anti-Jo, negative anticentromere, negative scleroderma.   Antimitochondrial antibodies negative.  Anti-tissue transglutaminase negative, anti-reticulin antibodies negative, IgG testing negative, antigliadin negative.   HSV-2 IgG positive.  Hepatitis A antibody negative, hepatitis B sAg.   Ceruloplasmin borderline low.   CMV IgM negative, EBV IgM negative.   DAT positive, haptoglobin low.   HIT screen positive, SRA weakly positive.   Anti-LK microsomal antibodies negative.  She was started on prednisone 20 mg and Plaquenil.   No change to weakness today, no new joint pains, fever, confusion, rash. -Continue hydroxychloroquine 200 mg, prednisone 20 mg  -Records  have  been faxed to her PCP Dr. Alanson Puls office.  They will make Rheum referral.  SNF will need to coordinate with Dr. Arelia Sneddon' office to confirm rheum referral is in place, and coordinate transportation     Weakness CK normal.  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  Orthostatic hypotension Resolved.  Hypophosphatemia/ Hypomagnesemia/ Hypokalemia/ Hypocalcemia Resolved.  Bipolar 1 disorder versus other underlying psychiatric disorder -Continue aripiprazole -Will continue SSRI for now, but discuss escitalopram induced liver injury with RPh  Severe protein calorie malnutrition -Continue Ensure -Nutrition consulted  Hypertension -Continue metoprolol  Stage II sacrum, not POA      MDM and disposition: The below labs and imaging reports reviewed and summarized above.  Medication management as above.  The patient was admitted with fall, found to have sepsis.  Ultimately she was found to have positive ANA, leukopenia, and anemia, and hepatitis, which were thought to be a constellation of rheumatologic disease, unclear etiology.  She has numerous positive autoimmune serologies.  She was started on Plaquenil and prednisone, and arrangements are being made for rheumatology follow-up.  At this point she is significantly debilitated from her baseline, and needs significant assistance just to sit out of bed, PT evaluation is recommended SNF, and arrangements are being pursued for safe discharge.        DVT prophylaxis: SCDs Code Status: FULL Family Communication: NOne presnet    Consultants:   GI  Heme  Procedures:   Bone marrow biopsy  Liver biopsy  Antimicrobials:   Vanc  11/20 >> 11/23  CEfepime 11/20 >> 11/23   Subjective: Change to fatigue, weakness.  No new fever, joint pains, confusion, abdominal pain, chest pain, dyspnea.  Objective: Vitals:   06/15/18 0532 06/15/18 0847 06/15/18 1121 06/15/18 1325  BP: (!) 83/60  1_0  Pulse: 88  95 86  Resp: _1 Temp: 98.1 F (36.7 C)  98.6 F (37 C)   TempSrc:   Oral   SpO2: 100%  100% 100%  Weight:      Height:        Intake/Output Summary (Last 24 hours) at 06/15/2018 1548 Last data filed at 06/15/2018 0913 Gross per 24 hour  Intake 120 ml  Output -  Net 120 ml   Filed Weights   05/30/18 0854 06/06/18 0956  Weight: 46.3 kg 46.3 kg    Examination: General appearance: Thin adult female, lying in bed, interactive, no obvious distress.   HEENT: Anicteric, conjunctival pink, lids and lashes normal.  No nasal deformity, discharge, or epistaxis.  Lips moist, oropharynx moist, no oral lesions, hearing normal.   Skin: Skin warm and dry without suspicious rashes or lesions. Cardiac: Regular rate and rhythm, no murmurs, JVP normal, no lower extremity edema. Respiratory: Normal respiratory rate and rhythm, lungs clear without rales or wheezes. Abdomen: Abdomen soft without tenderness to palpation, ascites, distention, or hepatosplenomegaly. MSK: No deformities or effusions of the joints of the large joints of the upper or lower extremities bilaterally.  No redness, bogginess, or tenderness of the symmetric small joints of the hands.  There is loss of subcutaneous muscle mass and fat. Neuro: Awake and alert, extraocular movements intact, globally very weak, unable to sit out of bed, appears to have symmetric, 4/5 strength in legs arms.  Speech fluent.Marland Kitchen    Psych: Sensorium intact and responding to questions, attention normal, affect normal, judgment and insight appear normal.    Data Reviewed: I have personally reviewed following labs and  imaging studies:  CBC: Recent Labs  Lab 06/09/18 0614 06/10/18 0551 06/12/18 0607 06/14/18 0548  WBC 3.4* 3.6* 3.4* 4.0  NEUTROABS 1.7 2.2  --   --   HGB 9.1* 9.1* 9.5* 9.5*  HCT 29.6* 29.9* 30.4* 31.1*  MCV 90.5 92.3 91.0 91.7  PLT 158 145* 187 409   Basic Metabolic Panel: Recent Labs  Lab  06/09/18 0614 06/10/18 0551 06/12/18 0607 06/14/18 0548  NA 142 141 143 143  K 3.9 4.4 3.7 3.6  CL 108 103 103 104  CO2 31 30 32 32  GLUCOSE 80 85 77 83  BUN _0 21*  CREATININE 0.52 0.65 0.61 0.59  CALCIUM 8.2* 8.3* 8.7* 8.9  MG 2.2 2.2  --   --   PHOS 3.5 5.8*  --   --    GFR: Estimated Creatinine Clearance: 59.4 mL/min (by C-G formula based on SCr of 0.59 mg/dL). Liver Function Tests: Recent Labs  Lab 06/09/18 0614 06/10/18 0551 06/12/18 0607 06/14/18 0548  AST 422* 420* 327* 250*  ALT 112* 129* 128* 129*  ALKPHOS 396* 366* 337* 311*  BILITOT 0.7 0.7 0.8 1.0  PROT 6.9 7.1 7.4 7.5  ALBUMIN 2.2* 2.2* 2.5* 2.9*   No results for input(s): LIPASE, AMYLASE in the last 168 hours. No results for input(s): AMMONIA in the last 168 hours. Coagulation Profile: Recent Labs  Lab 06/12/18 0607  INR 0.97   Cardiac Enzymes: No results for input(s): CKTOTAL, CKMB, CKMBINDEX, TROPONINI in the last 168 hours. BNP (last 3 results) No results for input(s): PROBNP in the last 8760 hours. HbA1C: No results for input(s): HGBA1C in the last 72 hours. CBG: No results for input(s): GLUCAP in the last 168 hours. Lipid Profile: No results for input(s): CHOL, HDL, LDLCALC, TRIG, CHOLHDL, LDLDIRECT in the last 72 hours. Thyroid Function Tests: No results for input(s): TSH, T4TOTAL, FREET4, T3FREE, THYROIDAB in the last 72 hours. Anemia Panel: No results for input(s): VITAMINB12, FOLATE, FERRITIN, TIBC, IRON, RETICCTPCT in the last 72 hours. Urine analysis:    Component Value Date/Time   COLORURINE YELLOW 05/24/2018 0111   APPEARANCEUR HAZY (A) 05/24/2018 0111   LABSPEC 1.017 05/24/2018 0111   PHURINE 5.0 05/24/2018 0111   GLUCOSEU NEGATIVE 05/24/2018 0111   HGBUR NEGATIVE 05/24/2018 0111   BILIRUBINUR NEGATIVE 05/24/2018 0111   KETONESUR NEGATIVE 05/24/2018 0111   PROTEINUR NEGATIVE 05/24/2018 0111   UROBILINOGEN 0.2 02/27/2014 0501   NITRITE NEGATIVE 05/24/2018 0111    LEUKOCYTESUR SMALL (A) 05/24/2018 0111   Sepsis Labs: _1 (procalcitonin:4,lacticacidven:4)  )No results found for this or any previous visit (from the past 240 hour(s)).       Radiology Studies: No results found.      Scheduled Meds: . ARIPiprazole  15 mg Oral Daily  . escitalopram  5 mg Oral Daily  . feeding supplement (ENSURE ENLIVE)  237 mL Oral BID BM  . folic acid  1 mg Oral Daily  . hydroxychloroquine  200 mg Oral Daily  . metoprolol tartrate  25 mg Oral BID  . multivitamin with minerals  1 tablet Oral Daily  . predniSONE  20 mg Oral Q breakfast  . thiamine  100 mg Oral Daily   Or  . thiamine  100 mg Intravenous Daily   Continuous Infusions:   LOS: 16 days    Time spent: 45 minutes was spent with the patient, face to face or on the floor, with greater than 50% of total time spent examining patient, counseling patient,  and coordinating care.     Edwin Dada, MD [facility] 06/15/2018, 3:48 PM     Please page through AMION:  [email or web address] Password [email or web address] If 7PM-7AM, please contact night-coverage

## 2018-06-15 NOTE — Progress Notes (Signed)
Occupational Therapy Treatment Patient Details Name: Yvonne BalsamMelvine [name] MRN: [id number] DOB: 02-Sep-1965 Today's Date: 06/15/2018    History of present illness 52 yo female presented to ED after a fall. Found to have orthostatic hypotension, fever, sepsis-unclear etiology, possible autoimmune disease. Patient resides in a group home.     OT comments  Pt more animated today. Performed oral care, UE exercises and walked in hall again (did so with PT) at pt's request.   Follow Up Recommendations  SNF    Equipment Recommendations  3 in 1 bedside commode    Recommendations for Other Services      Precautions / Restrictions Precautions Precautions: Fall Precaution Comments: has low BP Restrictions Weight Bearing Restrictions: No       Mobility Bed Mobility   Bed Mobility: Supine to Sit     Supine to sit: Min assist Sit to supine: Min assist;Mod assist   General bed mobility comments: assist for trunk for oob and for legs back to bed  Transfers Overall transfer level: Needs assistance Equipment used: Rolling walker (2 wheeled) Transfers: Sit to/from Stand Sit to Stand: Min assist         General transfer comment: light assist to rise and steady    Balance Overall balance assessment: Needs assistance;History of Falls Sitting-balance support: Feet supported;Bilateral upper extremity supported Sitting balance-Leahy Scale: Good     Standing balance support: Bilateral upper extremity supported;During functional activity Standing balance-Leahy Scale: Poor                             ADL either performed or assessed with clinical judgement   ADL       Grooming: Oral care;Set up;Sitting                                 General ADL Comments: pt did not need to use toilet. Requested walking again in hall.  Min A level for safety/steadying. Pt let go of walker prior to reaching bed:  cues for safety     Vision       Perception     Praxis       Cognition Arousal/Alertness: Awake/alert Behavior During Therapy: WFL for tasks assessed/performed Overall Cognitive Status: No family/caregiver present to determine baseline cognitive functioning Area of Impairment: Attention;Problem solving                             Problem Solving: Slow processing;Requires tactile cues General Comments: moves slowly but improved initiation.  Pt more animated today.  Cues for safety        Exercises Other Exercises Other Exercises: worked on Active and light resistance exercises for FF, biceps   Shoulder Instructions       General Comments      Pertinent Vitals/ Pain       Pain Assessment: No/denies pain Faces Pain Scale: Hurts little more Pain Location: buttock Pain Intervention(s): Monitored during session  Home Living                                          Prior Functioning/Environment              Frequency  Min 2X/week        Progress Toward  Goals  OT Goals(current goals can now be found in the care plan section)  Progress towards OT goals: Progressing toward goals     Plan      Co-evaluation                 AM-PAC OT "6 Clicks" Daily Activity     Outcome Measure   Help from another person eating meals?: A Little Help from another person taking care of personal grooming?: A Little Help from another person toileting, which includes using toliet, bedpan, or urinal?: A Lot Help from another person bathing (including washing, rinsing, drying)?: A Lot Help from another person to put on and taking off regular upper body clothing?: A Little Help from another person to put on and taking off regular lower body clothing?: A Lot 6 Click Score: 15    End of Session Equipment Utilized During Treatment: Gait belt  OT Visit Diagnosis: Unsteadiness on feet (R26.81);Muscle weakness (generalized) (M62.81)   Activity Tolerance Patient tolerated treatment well   Patient Left in  bed;with call bell/phone within reach;with bed alarm set   Nurse Communication          Time: [id number] OT Time Calculation (min): 23 min  Charges: OT General Charges $OT Visit: 1 Visit OT Treatments $Self Care/Home Management : 8-22 mins  Marica Otter, OTR/L Acute Rehabilitation Services [phone] [facility] pager [phone] office 06/15/2018   Orazio Weller 06/15/2018, 3:59 PM

## 2018-06-16 LAB — CBC
HEMATOCRIT: 30.2 % — AB (ref 36.0–46.0)
Hemoglobin: 9.1 g/dL — ABNORMAL LOW (ref 12.0–15.0)
MCH: 28.3 pg (ref 26.0–34.0)
MCHC: 30.1 g/dL (ref 30.0–36.0)
MCV: 94.1 fL (ref 80.0–100.0)
Platelets: 164 10*3/uL (ref 150–400)
RBC: 3.21 MIL/uL — ABNORMAL LOW (ref 3.87–5.11)
RDW: 17.5 % — ABNORMAL HIGH (ref 11.5–15.5)
WBC: 3 10*3/uL — ABNORMAL LOW (ref 4.0–10.5)
nRBC: 0.7 % — ABNORMAL HIGH (ref 0.0–0.2)

## 2018-06-16 LAB — COMPREHENSIVE METABOLIC PANEL
ALT: 97 U/L — ABNORMAL HIGH (ref 0–44)
AST: 155 U/L — ABNORMAL HIGH (ref 15–41)
Albumin: 2.8 g/dL — ABNORMAL LOW (ref 3.5–5.0)
Alkaline Phosphatase: 232 U/L — ABNORMAL HIGH (ref 38–126)
Anion gap: 7 (ref 5–15)
BUN: 17 mg/dL (ref 6–20)
CHLORIDE: 106 mmol/L (ref 98–111)
CO2: 29 mmol/L (ref 22–32)
Calcium: 8.6 mg/dL — ABNORMAL LOW (ref 8.9–10.3)
Creatinine, Ser: 0.59 mg/dL (ref 0.44–1.00)
GFR calc Af Amer: >60 mL/min (ref >60)
GFR calc non Af Amer: >60 mL/min (ref >60)
Glucose, Bld: 79 mg/dL (ref 70–99)
POTASSIUM: 3.4 mmol/L — AB (ref 3.5–5.1)
SODIUM: 142 mmol/L (ref 135–145)
Total Bilirubin: 0.7 mg/dL (ref 0.3–1.2)
Total Protein: 7.3 g/dL (ref 6.5–8.1)

## 2018-06-16 NOTE — Progress Notes (Addendum)
PROGRESS NOTE  Yvonne [name] LKG:[id number] DOB: [birth year] DOA: 05/30/2018 PCP: Leonard Downing, MD  HPI/Recap of past 24 hours: Yvonne [name] is a 52 y.o. F with bipolar disorder, lives in group home, who presented with lightheadedness and fall.  Found to have significant orthostatic hypotension.  Following admission to the floor, patient developed fever to 101F, tachycardia, started on empiric antibiotics.    06/16/2018: Patient seen and examined at bedside.  No acute events overnight.  She has no new complaints.  She denies nausea, abdominal pain or diarrhea.  Awaiting SNF placement.    Assessment/Plan: Principal Problem:   Orthostatic hypotension Active Problems:   Bipolar I disorder, current or most recent episode manic, with psychotic features (HCC)   Severe protein-calorie malnutrition (HCC)   Hypocalcemia   Transaminitis   Fever   Thrombocytopenia (HCC)   Abnormal LFTs (liver function tests)   Pancytopenia (HCC)   Pressure injury of skin  Resolving sepsis secondary to unclear etiology Presented with fever with T-max 101.8, tachycardia, tachypnea and leukopenia  Treated empirically with 5 days of IV vancomycin and cefepime Blood cultures negative to date Urine cultures negative Hepatitis panel, influenza, CMV and EBV unremarkable  Afebrile with no leukocytosis  Abnormal LFTs/transaminitis, autoimmune hepatitis ruled out LFTs continue to improve MRCP did not show any hepatic lesion. No biliary duct dilatation or gallbladder sludge.  UDS, alcohol level and salicylate level unremarkable on admission. Hepatitis panel unremarkable, Antimicrosomal antibody 0.8 Gastroenterology consulted.  Underwent liver biopsy, results do not show inflammation consistent with AH.  Hep Anegative, HepBshows immunity.  Orthostatic hypotension Resolved.  Pancytopenia Bone marrow biopsy showed no Leukemia, Lymphoma or evidence of Cancer. There was no obvious Dysplasia. Patient has  been transfused 1 unit PRBC.    Weakness CK normal.  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  Suspected autoimmune disease Possibly lupus, Sjogren's.  LDH 1063, CRP 1.1, ESR 65, ANA positive, double-stranded DNA 135, ENA SM antibody greater than 8, chromatin antibody greater than 8, ribonucleic protein 7.6, SSA antibody IgG greater than 8, SSB IgG G antibiogram than 8.  ANA screen positive.  Markedly positive double-stranded DNA, anti-RNP, anti-Smith, SSA, SSB, anti-chromatin, anti-smooth muscle.  Negative anti-Jo, negative anticentromere, negative scleroderma.  Antimitochondrial antibodies negative.  Anti-tissue transglutaminase negative, anti-reticulin antibodies negative, IgG testing negative, antigliadin negative.  HSV-2 IgG positive.  Hepatitis A antibody negative, hepatitis B sAg.  Ceruloplasmin borderline low.  CMV IgM negative, EBV IgM negative.  DAT positive, haptoglobin low.  HIT screen positive, SRA weakly positive.  Anti-LK microsomal antibodies negative. -Continue hydroxychloroquine 200 mg, prednisone 20 mg -Patient will need to follow-up with rheumatology as an outpatient  Hypophosphatemia/ Hypomagnesemia/ Hypokalemia/ Hypocalcemia  Bipolar 1 disorder versus other underlying psychiatric disorder -Continue aripiprazole, escitalopram  Severe protein calorie malnutrition -Continue Ensure -Nutrition consulted  Hypertension -Continue metoprolol  Stage II sacrum, not POA    DVT prophylaxis:  SCDs due to allergy to heparin Code Status: FULL Family Communication: NOne presnet    Consultants:   GI  Heme  Procedures:   Bone marrow biopsy  Liver biopsy  Antimicrobials:   Vanc  11/20 >> 11/23  CEfepime 11/20 >> 11/23     Objective: Vitals:   06/16/18 0603 06/16/18 0759 06/16/18 1131 06/16/18 1356  BP: (!) 89/59 100/68  96/70  Pulse: 80   93  Resp: '17 16  16  ' Temp: 98.3 F (36.8 C)   98.8 F (37.1 C)  TempSrc:  Oral   Oral  SpO2:  99%  98% 100%  Weight:      Height:        Intake/Output Summary (Last 24 hours) at 06/16/2018 1515 Last data filed at 06/16/2018 0800 Gross per 24 hour  Intake 240 ml  Output -  Net 240 ml   Filed Weights   05/30/18 0854 06/06/18 0956  Weight: 46.3 kg 46.3 kg    Exam:  . General: 52 y.o. year-old female well developed well nourished in no acute distress.  Alert and oriented x3. . Cardiovascular: Regular rate and rhythm with no rubs or gallops.  No thyromegaly or JVD noted.   Marland Kitchen Respiratory: Clear to auscultation with no wheezes or rales. Good inspiratory effort. . Abdomen: Soft nontender nondistended with normal bowel sounds x4 quadrants. . Musculoskeletal: No lower extremity edema. 2/4 pulses in all 4 extremities. . Skin: No ulcerative lesions noted or rashes, . Psychiatry: Mood is appropriate for condition and setting   Data Reviewed: CBC: Recent Labs  Lab 06/10/18 0551 06/12/18 0607 06/14/18 0548 06/16/18 0730  WBC 3.6* 3.4* 4.0 3.0*  NEUTROABS 2.2  --   --   --   HGB 9.1* 9.5* 9.5* 9.1*  HCT 29.9* 30.4* 31.1* 30.2*  MCV 92.3 91.0 91.7 94.1  PLT 145* 187 208 549   Basic Metabolic Panel: Recent Labs  Lab 06/10/18 0551 06/12/18 0607 06/14/18 0548 06/16/18 0730  NA 141 143 143 142  K 4.4 3.7 3.6 3.4*  CL 103 103 104 106  CO2 30 32 32 29  GLUCOSE 85 77 83 79  BUN 17 16 21* 17  CREATININE 0.65 0.61 0.59 0.59  CALCIUM 8.3* 8.7* 8.9 8.6*  MG 2.2  --   --   --   PHOS 5.8*  --   --   --    GFR: Estimated Creatinine Clearance: 59.4 mL/min (by C-G formula based on SCr of 0.59 mg/dL). Liver Function Tests: Recent Labs  Lab 06/10/18 0551 06/12/18 0607 06/14/18 0548 06/16/18 0730  AST 420* 327* 250* 155*  ALT 129* 128* 129* 97*  ALKPHOS 366* 337* 311* 232*  BILITOT 0.7 0.8 1.0 0.7  PROT 7.1 7.4 7.5 7.3  ALBUMIN 2.2* 2.5* 2.9* 2.8*   No results for input(s): LIPASE, AMYLASE in the last 168 hours. No results for input(s): AMMONIA in  the last 168 hours. Coagulation Profile: Recent Labs  Lab 06/12/18 0607  INR 0.97   Cardiac Enzymes: No results for input(s): CKTOTAL, CKMB, CKMBINDEX, TROPONINI in the last 168 hours. BNP (last 3 results) No results for input(s): PROBNP in the last 8760 hours. HbA1C: No results for input(s): HGBA1C in the last 72 hours. CBG: No results for input(s): GLUCAP in the last 168 hours. Lipid Profile: No results for input(s): CHOL, HDL, LDLCALC, TRIG, CHOLHDL, LDLDIRECT in the last 72 hours. Thyroid Function Tests: No results for input(s): TSH, T4TOTAL, FREET4, T3FREE, THYROIDAB in the last 72 hours. Anemia Panel: No results for input(s): VITAMINB12, FOLATE, FERRITIN, TIBC, IRON, RETICCTPCT in the last 72 hours. Urine analysis:    Component Value Date/Time   COLORURINE YELLOW 05/24/2018 0111   APPEARANCEUR HAZY (A) 05/24/2018 0111   LABSPEC 1.017 05/24/2018 0111   PHURINE 5.0 05/24/2018 0111   GLUCOSEU NEGATIVE 05/24/2018 0111   HGBUR NEGATIVE 05/24/2018 0111   BILIRUBINUR NEGATIVE 05/24/2018 0111   KETONESUR NEGATIVE 05/24/2018 0111   PROTEINUR NEGATIVE 05/24/2018 0111   UROBILINOGEN 0.2 02/27/2014 0501   NITRITE NEGATIVE 05/24/2018 0111   LEUKOCYTESUR SMALL (A) 05/24/2018 0111   Sepsis  Labs: '@LABRCNTIP' (procalcitonin:4,lacticidven:4)  )No results found for this or any previous visit (from the past 240 hour(s)).    Studies: No results found.  Scheduled Meds: . ARIPiprazole  15 mg Oral Daily  . escitalopram  5 mg Oral Daily  . feeding supplement (ENSURE ENLIVE)  237 mL Oral BID BM  . folic acid  1 mg Oral Daily  . hydroxychloroquine  200 mg Oral Daily  . metoprolol tartrate  25 mg Oral BID  . multivitamin with minerals  1 tablet Oral Daily  . predniSONE  20 mg Oral Q breakfast  . thiamine  100 mg Oral Daily   Or  . thiamine  100 mg Intravenous Daily    Continuous Infusions:   LOS: 17 days     Kayleen Memos, MD [facility] Pager [phone]  If  7PM-7AM, please contact night-coverage [email or web address] Password [email or web address] 06/16/2018, 3:15 PM

## 2018-06-16 NOTE — Progress Notes (Addendum)
CSW assisting with disposition- pursuing SNF placement. Pt's pasrr in manual review- faxed requested documents to [facility] today. Expanded bed search for Medicaid SNF bed.  14:27-CSW informed that as facilities attempting to verify pt's insurance coverage are receiving message in [place] tracks that pt's Medicaid number inactive/invalid. Attempted to run with SS# as well and still invalid. CSW spoke with pt's group home administrator Izell Carolinanitha Curian [phone] [facility] She states she will locate pt's identification/insurance cards and communicate to CSW in case any on file are inaccurate.  Ilean SkillMeghan Latina Frank, MSW, LCSW Clinical Social Work 06/16/2018 [phone] Coverage for [phone]

## 2018-06-16 NOTE — Progress Notes (Signed)
Patient sat in chair then ambulated in hall ([id number] ) with RN on 06/16/18.

## 2018-06-17 DIAGNOSIS — I10 Essential (primary) hypertension: Secondary | ICD-10-CM

## 2018-06-17 NOTE — Progress Notes (Signed)
PROGRESS NOTE    Yvonne [name]  UXN:[id number] DOB: Oct 12, 1965 DOA: 05/30/2018 PCP: Leonard Downing, MD      Brief Narrative:  Mrs. Yvonne [name] is a 52 y.o. F with bipolar disorder, lives in group home, who presented with lightheadedness and fall.  Found to have significant orthostatic hypotension.  Following admission to the floor, patient developed fever to 101F, tachycardia, started on empiric antibiotics, unclear source.    After admission, it became clear that in addition to fever, patient was noted to have leukopenia, anemia, transaminitis, generalized weakness, and positive ANA, although without joint pain, rashes, evidence of serositis or any other focal findings of autoimmune disease.  GI were consulted for evaluation of autoimmune hepatitis.     Assessment & Plan:  Sepsis secondary to unclear etiology Patient did have a temperature of 101.8, tachycardia, tachypnea and leukopenia at presentation.  Treated empirically with vancomycin and cefepime, 5 days of antibiotics, discontinued and sepsis physiology appeared resolved.    Blood cultures show no growth to date, urine culture negative.  Hepatitis panel, influenza, CMV and EBV titers unremarkable.  Unclear source of infection.     Abnormal LFTs/transaminitis Autoimmune hepatitis ruled out Noted to have LFTs in 400s on admission.  Patient resides in a group home, unclear if she uses alcohol.  Was placed on CIWA protocol empirically.    Right upper quadrant ultrasound showed coarse echotexture of the hepatic parenchyma suggesting diffuse hepatocellular disease, possible hepatitis. MRCP did not show any hepatic lesion.  No biliary duct dilatation or gallbladder sludge.    UDS, alcohol level and salicylate level unremarkable on admission. Hepatitis serologies unremarkable, Antimicrosomal antibody 0.8.  Gastroenterology consulted.  Underwent liver biopsy, discussed with Pathology, the liver biopsy results do not show  inflammation consistent with AH, more consistent with a drug induced hepatitis, less likely a nonspecific autoimmune inflammation.  Abilify has not been reported to cause liver injury.  Lexapro causes liver injury in <1% of cases, making this very unlikely.  The only other likely contributor is that the patient was on (apparently) scheduled acetaminophen PTA.  Although her acetaminophen level was negative at admission, it is possible we have witnessed a Tylenol liver injury that preceded admission.  If this were the case, it would continue to resolve with holding acetaminophen and supportive care. -SNF will need to arrange GI follow up with Dr. Fuller Plan at [facility]   Pancytopenia Patient with thrombocytopenia, anemia and leukopenia.  Stable.  Hematology oncology consulted. Bone marrow biopsy showed no Leukemia, Lymphoma or evidence of Cancer.  Cytogenetics normal. No MDS. There was no obvious Dysplasia. Patient has been transfused 1 unit PRBC and counts remained stable.      Suspected autoimmune disease Early evaluation for hepatitis included ANA, which was positive, subsequent work up showed:  LDH 1063 CRP 1.1, ESR 65 Markedly positive double-stranded DNA, anti-RNP, anti-Smith, SSA, SSB, anti-chromatin, anti-smooth muscle.   Negative anti-Jo, negative anticentromere, negative scleroderma.   Antimitochondrial antibodies negative.  Anti-tissue transglutaminase negative, anti-reticulin antibodies negative, IgG testing negative, antigliadin negative.   HSV-2 IgG positive.  Hepatitis A antibody negative, hepatitis B sAg.   Ceruloplasmin borderline low.   CMV IgM negative, EBV IgM negative.   DAT positive, haptoglobin low.   HIT screen positive, SRA weakly positive.   Anti-LK microsomal antibodies negative.  She was started on prednisone 20 mg and Plaquenil.   No change to weakness today, no new joint pains, fever, confusion, rash. -Continue hydroxychloroquine 200 mg, prednisone 20 mg  daily  -Records  have been faxed to her PCP Dr. Alanson Puls office.  They will make Rheum referral.  SNF will need to coordinate with Dr. Arelia Sneddon' office to confirm rheum referral is in place, and coordinate transportation     Weakness CK normal.  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  Orthostatic hypotension Resolved.  Hypophosphatemia/ Hypomagnesemia/ Hypokalemia/ Hypocalcemia Resolved.  Bipolar 1 disorder versus other underlying psychiatric disorder -Continue aripiprazole, Lexapro  Severe protein calorie malnutrition -Continue Ensure -Nutrition consulted  Hypertension BP normal -Continue metoprolol  Stage II sacrum, not POA      MDM and disposition: The below labs and imaging reports reviewed and summarized above.  Medication management as above.  The patient was admitted with fall, found to have sepsis.  Ultimately she was found to have positive ANA, leukopenia, and anemia, and hepatitis, which were thought to be a constellation of rheumatologic disease, unclear etiology.  She has numerous positive autoimmune serologies.  She was started on Plaquenil and prednisone, and arrangements are being made for rheumatology follow-up.    The patient remained significantly debilitated from her baseline, requiring substantial assistance just to get out of bed, ambulate steadily.  PT evaluation recommended SNF, arrangements are being arranged for safe discharge.         DVT prophylaxis: SCDs Code Status: FULL Family Communication: NOne presnet    Consultants:   GI  Heme  Procedures:   Bone marrow biopsy  Liver biopsy  Antimicrobials:   Vanc  11/20 >> 11/23  CEfepime 11/20 >> 11/23   Subjective: Still fatigued, weak.  No new rashes, joint pains, confusion, chest pain, dyspnea, pleurisy.  Objective: Vitals:   06/16/18 1131 06/16/18 1356 06/16/18 2211 06/17/18 0603  BP:  96/70 111/79 102/72  Pulse:  93 71 84  Resp:  _0 Temp:  98.8 F (37.1 C) 98.4 F (36.9 C) 98.4 F (36.9 C)  TempSrc:  Oral Oral Oral  SpO2: 98% 100% 100% 100%  Weight:      Height:       No intake or output data in the 24 hours ending 06/17/18 1454 Filed Weights   05/30/18 0854 06/06/18 0956  Weight: 46.3 kg 46.3 kg    Examination: General appearance: Very thin adult female, lying in bed, appears listless.  Interactive. HEENT: Anicteric, conjunctival pink, lids and lashes normal.  No nasal deformity, discharge, or epistaxis, lips moist, oropharynx moist, no oral lesions, hearing normal. Skin: Skin warm and dry without suspicious rashes or lesions. Cardiac: RRR, no murmurs, JVP normal, no lower extremity edema. Respiratory: Normal respiratory rate and rhythm, lungs clear without rales or wheezes. Abdomen: Abdomen soft without tenderness to palpation, ascites, distention, or hepatosplenomegaly. MSK: No new joint effusions or redness or swelling.  Diffuse loss of subcutaneous muscle mass and fat. Neuro: Awake and alert, extraocular movements intact, face symmetric, speech fluent, globally 4/5 strength, symmetric coordination. Psych: Makes eye contact, responds appropriately to questions, oriented to person, place, and time, affect flat, listless.    Data Reviewed: I have personally reviewed following labs and imaging studies:  CBC: Recent Labs  Lab 06/12/18 0607 06/14/18 0548 06/16/18 0730  WBC 3.4* 4.0 3.0*  HGB 9.5* 9.5* 9.1*  HCT 30.4* 31.1* 30.2*  MCV 91.0 91.7 94.1  PLT 187 208 884   Basic Metabolic Panel: Recent Labs  Lab 06/12/18 0607 06/14/18 0548 06/16/18 0730  NA 143 143 142  K 3.7 3.6 3.4*  CL 103 104 106  CO2 32 32 29  GLUCOSE 77 83 79  BUN 16 21* 17  CREATININE 0.61 0.59 0.59  CALCIUM 8.7* 8.9 8.6*   GFR: Estimated Creatinine Clearance: 59.4 mL/min (by C-G formula based on SCr of 0.59 mg/dL). Liver Function Tests: Recent Labs  Lab 06/12/18 0607 06/14/18 0548 06/16/18 0730  AST 327*  250* 155*  ALT 128* 129* 97*  ALKPHOS 337* 311* 232*  BILITOT 0.8 1.0 0.7  PROT 7.4 7.5 7.3  ALBUMIN 2.5* 2.9* 2.8*   No results for input(s): LIPASE, AMYLASE in the last 168 hours. No results for input(s): AMMONIA in the last 168 hours. Coagulation Profile: Recent Labs  Lab 06/12/18 0607  INR 0.97   Cardiac Enzymes: No results for input(s): CKTOTAL, CKMB, CKMBINDEX, TROPONINI in the last 168 hours. BNP (last 3 results) No results for input(s): PROBNP in the last 8760 hours. HbA1C: No results for input(s): HGBA1C in the last 72 hours. CBG: No results for input(s): GLUCAP in the last 168 hours. Lipid Profile: No results for input(s): CHOL, HDL, LDLCALC, TRIG, CHOLHDL, LDLDIRECT in the last 72 hours. Thyroid Function Tests: No results for input(s): TSH, T4TOTAL, FREET4, T3FREE, THYROIDAB in the last 72 hours. Anemia Panel: No results for input(s): VITAMINB12, FOLATE, FERRITIN, TIBC, IRON, RETICCTPCT in the last 72 hours. Urine analysis:    Component Value Date/Time   COLORURINE YELLOW 05/24/2018 0111   APPEARANCEUR HAZY (A) 05/24/2018 0111   LABSPEC 1.017 05/24/2018 0111   PHURINE 5.0 05/24/2018 0111   GLUCOSEU NEGATIVE 05/24/2018 0111   HGBUR NEGATIVE 05/24/2018 0111   BILIRUBINUR NEGATIVE 05/24/2018 0111   KETONESUR NEGATIVE 05/24/2018 0111   PROTEINUR NEGATIVE 05/24/2018 0111   UROBILINOGEN 0.2 02/27/2014 0501   NITRITE NEGATIVE 05/24/2018 0111   LEUKOCYTESUR SMALL (A) 05/24/2018 0111   Sepsis Labs: _0 (procalcitonin:4,lacticacidven:4)  )No results found for this or any previous visit (from the past 240 hour(s)).       Radiology Studies: No results found.      Scheduled Meds: . ARIPiprazole  15 mg Oral Daily  . escitalopram  5 mg Oral Daily  . feeding supplement (ENSURE ENLIVE)  237 mL Oral BID BM  . folic acid  1 mg Oral Daily  . hydroxychloroquine  200 mg Oral Daily  . metoprolol tartrate  25 mg Oral BID  . multivitamin with minerals  1  tablet Oral Daily  . predniSONE  20 mg Oral Q breakfast  . thiamine  100 mg Oral Daily   Or  . thiamine  100 mg Intravenous Daily   Continuous Infusions:   LOS: 18 days    Time spent: 25 minutes     Edwin Dada, MD [facility] 06/17/2018, 2:54 PM     Please page through [place]:  [email or web address] Password [email or web address] If 7PM-7AM, please contact night-coverage

## 2018-06-18 NOTE — Progress Notes (Signed)
PROGRESS NOTE    Yvonne [name]  OVZ:[id number] DOB: [birth year] DOA: 05/30/2018 PCP: Yvonne Downing, MD      Brief Narrative:  Yvonne [name] is a 52 y.o. F with bipolar disorder, lives in group home, who presented with lightheadedness and fall.  Found to have significant orthostatic hypotension.  Following admission to the floor, patient developed fever to 101F, tachycardia, started on empiric antibiotics, unclear source.    After admission, it became clear that in addition to fever, patient was noted to have leukopenia, anemia, transaminitis, generalized weakness, and positive ANA, although without joint pain, rashes, evidence of serositis or any other focal findings of autoimmune disease.  GI were consulted for evaluation of autoimmune hepatitis.     Assessment & Plan:  Sepsis secondary to unclear etiology Patient did have a temperature of 101.8, tachycardia, tachypnea and leukopenia at presentation.  Treated empirically with vancomycin and cefepime, 5 days of antibiotics, discontinued and sepsis physiology appeared resolved.    Blood cultures show no growth to date, urine culture negative.  Hepatitis panel, influenza, CMV and EBV titers unremarkable.  Unclear source of infection.     Abnormal LFTs/transaminitis Autoimmune hepatitis ruled out Noted to have LFTs in 400s on admission.  Patient resides in a group home, unclear if she uses alcohol, but it seems unlikely.  Was placed on CIWA protocol empirically.    Right upper quadrant ultrasound showed coarse echotexture of the hepatic parenchyma suggesting diffuse hepatocellular disease, possible hepatitis. MRCP did not show any hepatic lesion.  No biliary duct dilatation or gallbladder sludge.    UDS, alcohol level and salicylate level unremarkable on admission. Hepatitis serologies unremarkable, Antimicrosomal antibody 0.8.  Gastroenterology consulted.  Underwent liver biopsy, discussed with Pathology, the liver biopsy  results do not show inflammation consistent with AH, more consistent with a drug induced hepatitis, less likely a nonspecific autoimmune inflammation.  Abilify has not been reported to cause liver injury.  Lexapro causes liver injury in <1% of cases, making this very unlikely.  The only other likely contributor is that the patient was on (apparently) scheduled acetaminophen PTA.  Although her acetaminophen level was negative at admission, it is possible we have witnessed a Tylenol liver injury that preceded admission.  If this were the case, it would continue to resolve with holding acetaminophen and supportive care. -SNF will need to arrange GI follow up with Yvonne [name] Plan at [facility]   Pancytopenia Patient with thrombocytopenia, anemia and leukopenia.  Stable.  Hematology oncology consulted. Bone marrow biopsy showed no Leukemia, Lymphoma or evidence of Cancer.  Cytogenetics normal. No MDS. There was no obvious Dysplasia. Patient has been transfused 1 unit PRBC and counts remained stable.      Suspected autoimmune disease Early evaluation for hepatitis included ANA, which was positive, subsequent work up showed:  LDH 1063 CRP 1.1, ESR 65 Markedly positive double-stranded DNA, anti-RNP, anti-Smith, SSA, SSB, anti-chromatin, anti-smooth muscle.   Negative anti-Jo, negative anticentromere, negative scleroderma.   Antimitochondrial antibodies negative.  Anti-tissue transglutaminase negative, anti-reticulin antibodies negative, IgG testing negative, antigliadin negative.   HSV-2 IgG positive.  Hepatitis A antibody negative, hepatitis B sAg.   Ceruloplasmin borderline low.   CMV IgM negative, EBV IgM negative.   DAT positive, haptoglobin low.   HIT screen positive, SRA weakly positive.   Anti-LK microsomal antibodies negative.  She was started on prednisone 20 mg and Plaquenil.   No change to weakness today, no new joint pains, fever, confusion, rash. -Continue hydroxychloroquine 200 mg,  prednisone 20  mg daily  -Records have been faxed to her PCP Yvonne [name] office.  They will make Rheum referral.  SNF will need to coordinate with Yvonne [name]' office to confirm rheum referral is in place, and coordinate transportation     Weakness CK normal.  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  This seems to be resolving.  Orthostatic hypotension Resolved.  Hypophosphatemia/ Hypomagnesemia/ Hypokalemia/ Hypocalcemia Resolved.  Bipolar 1 disorder versus other underlying psychiatric disorder -Continue aripiprazole, Lexapro  Severe protein calorie malnutrition -Continue Ensure -Nutrition consulted  Hypertension BP normal -Continue metoprolol  Stage II sacrum, not POA      MDM and disposition: The below labs and imaging reports reviewed and summarized above.  Medication management as above.  The patient was admitted with fall, found to have sepsis.  Ultimately she was found to have positive ANA, leukopenia, and anemia, and hepatitis, which were thought to be a constellation of rheumatologic disease, unclear etiology.  She has numerous positive autoimmune serologies.  She was started on Plaquenil and prednisone, and arrangements are being made for rheumatology follow-up.    The patient remains significantly debilitated from her baseline requiring substantial assistance to get out of bed, ambulate, take care of activities of daily living.  At present, her group home lacks resources to provide this support and social work is progressing towards arrangements for safe discharge to skilled nursing.        DVT prophylaxis: SCDs Code Status: FULL Family Communication: None present    Consultants:   GI  Heme  Procedures:   Bone marrow biopsy  Liver biopsy  Antimicrobials:   Vanc  11/20 >> 11/23  CEfepime 11/20 >> 11/23   Subjective: Fatigued and weak, but able to ambulate with assistance today.  Still requiring full assist  for transfer out of bed or to a chair.  No new fever, confusion.  No new rashes, joint pains.  No vomiting, diarrhea, headache.  Objective: Vitals:   06/17/18 0603 06/17/18 1524 06/17/18 2149 06/18/18 0512  BP: 102/72 95/67 106/76 99/63  Pulse: 84 86 74 79  Resp: '20  18 16  ' Temp: 98.4 F (36.9 C) 98.5 F (36.9 C) 98.2 F (36.8 C) 98.4 F (36.9 C)  TempSrc: Oral Oral Oral Oral  SpO2: 100% 100% 100% 98%  Weight:      Height:        Intake/Output Summary (Last 24 hours) at 06/18/2018 1445 Last data filed at 06/18/2018 1100 Gross per 24 hour  Intake 477 ml  Output -  Net 477 ml   Filed Weights   05/30/18 0854 06/06/18 0956  Weight: 46.3 kg 46.3 kg    Examination: General appearance: Very thin, frail adult female.  Sitting in recliner.  Affect blunted, but interactive. HEENT: Anicteric, conjunctival pink, lids and lashes normal.  No nasal deformity, discharge, or epistaxis.  Lips moist, oropharynx moist, no oral lesions, hearing normal. Skin: Skin warm and dry without suspicious rashes or lesions on the face, neck, upper chest, arms, or legs.   Cardiac: RRR no murmurs, JVP normal, no lower extremity edema. Respiratory: Normal respiratory rate and rhythm, lungs clear without rales or wheezes. Abdomen: Abdomen soft without tenderness to palpation, ascites, distention, or hepatosplenomegaly. MSK: No new joint effusions or redness or swelling.  Diffuse loss of subcutaneous muscle mass and fat. Neuro: Awake and alert, extraocular movements intact, face symmetric, speech fluent, globally 4/5 strength, symmetric coordination.  Gait shuffling. Psych: Exact contact, responds appropriately but slightly to questions,  oriented to person, place, and time.  Affect flat, listless.    Data Reviewed: I have personally reviewed following labs and imaging studies:  CBC: Recent Labs  Lab 06/12/18 0607 06/14/18 0548 06/16/18 0730  WBC 3.4* 4.0 3.0*  HGB 9.5* 9.5* 9.1*  HCT 30.4* 31.1* 30.2*   MCV 91.0 91.7 94.1  PLT 187 208 544   Basic Metabolic Panel: Recent Labs  Lab 06/12/18 0607 06/14/18 0548 06/16/18 0730  NA 143 143 142  K 3.7 3.6 3.4*  CL 103 104 106  CO2 32 32 29  GLUCOSE 77 83 79  BUN 16 21* 17  CREATININE 0.61 0.59 0.59  CALCIUM 8.7* 8.9 8.6*   GFR: Estimated Creatinine Clearance: 59.4 mL/min (by C-G formula based on SCr of 0.59 mg/dL). Liver Function Tests: Recent Labs  Lab 06/12/18 0607 06/14/18 0548 06/16/18 0730  AST 327* 250* 155*  ALT 128* 129* 97*  ALKPHOS 337* 311* 232*  BILITOT 0.8 1.0 0.7  PROT 7.4 7.5 7.3  ALBUMIN 2.5* 2.9* 2.8*   No results for input(s): LIPASE, AMYLASE in the last 168 hours. No results for input(s): AMMONIA in the last 168 hours. Coagulation Profile: Recent Labs  Lab 06/12/18 0607  INR 0.97   Cardiac Enzymes: No results for input(s): CKTOTAL, CKMB, CKMBINDEX, TROPONINI in the last 168 hours. BNP (last 3 results) No results for input(s): PROBNP in the last 8760 hours. HbA1C: No results for input(s): HGBA1C in the last 72 hours. CBG: No results for input(s): GLUCAP in the last 168 hours. Lipid Profile: No results for input(s): CHOL, HDL, LDLCALC, TRIG, CHOLHDL, LDLDIRECT in the last 72 hours. Thyroid Function Tests: No results for input(s): TSH, T4TOTAL, FREET4, T3FREE, THYROIDAB in the last 72 hours. Anemia Panel: No results for input(s): VITAMINB12, FOLATE, FERRITIN, TIBC, IRON, RETICCTPCT in the last 72 hours. Urine analysis:    Component Value Date/Time   COLORURINE YELLOW 05/24/2018 0111   APPEARANCEUR HAZY (A) 05/24/2018 0111   LABSPEC 1.017 05/24/2018 0111   PHURINE 5.0 05/24/2018 0111   GLUCOSEU NEGATIVE 05/24/2018 0111   HGBUR NEGATIVE 05/24/2018 0111   BILIRUBINUR NEGATIVE 05/24/2018 0111   KETONESUR NEGATIVE 05/24/2018 0111   PROTEINUR NEGATIVE 05/24/2018 0111   UROBILINOGEN 0.2 02/27/2014 0501   NITRITE NEGATIVE 05/24/2018 0111   LEUKOCYTESUR SMALL (A) 05/24/2018 0111   Sepsis  Labs: '@[id number]' (procalcitonin:4,lacticacidven:4)  )No results found for this or any previous visit (from the past 240 hour(s)).       Radiology Studies: No results found.      Scheduled Meds: . ARIPiprazole  15 mg Oral Daily  . escitalopram  5 mg Oral Daily  . feeding supplement (ENSURE ENLIVE)  237 mL Oral BID BM  . folic acid  1 mg Oral Daily  . hydroxychloroquine  200 mg Oral Daily  . metoprolol tartrate  25 mg Oral BID  . multivitamin with minerals  1 tablet Oral Daily  . predniSONE  20 mg Oral Q breakfast  . thiamine  100 mg Oral Daily   Or  . thiamine  100 mg Intravenous Daily   Continuous Infusions:   LOS: 19 days    Time spent: 25 minutes     Edwin Dada, MD [facility] 06/18/2018, 2:45 PM     Please page through [address]:  [email or web address] Password [email or web address] If 7PM-7AM, please contact night-coverage

## 2018-06-19 NOTE — Progress Notes (Signed)
Physical Therapy Treatment Patient Details Name: Yvonne BalsamMelvine [name] MRN: [id number] DOB: [birth year] Today's Date: 06/19/2018    History of Present Illness 52 yo female presented to ED after a fall. Found to have orthostatic hypotension, fever, sepsis-unclear etiology, possible autoimmune disease. Patient resides in a group home.      PT Comments    Pt more conversive and social.  Assisted OOB and amb a great distance in hallway.  General Gait Details: amb with walker first part with much improvement.  Amb without walker pt present with decreased gait speed but did well.  Improved balance and stride but still below age range.     Follow Up Recommendations  SNF(pt from a Group Home and close to her prior level of mobility)     Equipment Recommendations  None recommended by PT    Recommendations for Other Services       Precautions / Restrictions Precautions Precautions: Fall Restrictions Weight Bearing Restrictions: No    Mobility  Bed Mobility Overal bed mobility: Needs Assistance Bed Mobility: Supine to Sit     Supine to sit: Supervision     General bed mobility comments: pt was able to self perform with increased time  Transfers Overall transfer level: Needs assistance Equipment used: Rolling walker (2 wheeled);None Transfers: Sit to/from [employer]Stand;Stand Pivot Transfers Sit to Stand: Supervision;Min guard Stand pivot transfers: Supervision;Min guard       General transfer comment: pt able to self rise and lower safely using hands   Ambulation/Gait Ambulation/Gait assistance: Min assist Gait Distance (Feet): 75 Feet Assistive device: Rolling walker (2 wheeled);None Gait Pattern/deviations: Step-through pattern Gait velocity: decreased   General Gait Details: amb with walker first part with much improvement.  Amb without walker pt present with decreased gait speed but did well.  Improved balance and stride but still below age range.     Stairs              Wheelchair Mobility    Modified Rankin (Stroke Patients Only)       Balance                                            Cognition Arousal/Alertness: Awake/alert Behavior During Therapy: WFL for tasks assessed/performed Overall Cognitive Status: Within Functional Limits for tasks assessed                                 General Comments: quiet and pleasant following all commands and conversive.  Sharred that she was listed in the [employer] for 4 years.       Exercises      General Comments        Pertinent Vitals/Pain Pain Assessment: No/denies pain    Home Living                      Prior Function            PT Goals (current goals can now be found in the care plan section) Progress towards PT goals: Progressing toward goals    Frequency    Min 2X/week      PT Plan Current plan remains appropriate    Co-evaluation              AM-PAC PT "6 Clicks" Mobility   Outcome Measure  Help  needed turning from your back to your side while in a flat bed without using bedrails?: A Little Help needed moving from lying on your back to sitting on the side of a flat bed without using bedrails?: A Little Help needed moving to and from a bed to a chair (including a wheelchair)?: A Little Help needed standing up from a chair using your arms (e.g., wheelchair or bedside chair)?: A Little Help needed to walk in hospital room?: A Little Help needed climbing 3-5 steps with a railing? : A Little 6 Click Score: 18    End of Session Equipment Utilized During Treatment: Gait belt Activity Tolerance: Patient tolerated treatment well Patient left: in chair;with call bell/phone within reach Nurse Communication: Mobility status PT Visit Diagnosis: Muscle weakness (generalized) (M62.81)     Time: 1610-9604 PT Time Calculation (min) (ACUTE ONLY): 18 min  Charges:  $Gait Training: 8-22 mins                     Felecia Shelling   PTA Acute  Rehabilitation Services Pager      [phone] Office      [phone]

## 2018-06-19 NOTE — Progress Notes (Signed)
PROGRESS NOTE    Yvonne [name]  NMM:[id number] DOB: [birth year] DOA: 05/30/2018 PCP: Leonard Downing, MD      Brief Narrative:  Yvonne [name] is a 52 y.o. F with bipolar disorder, lives in group home, who presented with lightheadedness and fall.  Found to have significant orthostatic hypotension.  Following admission to the floor, patient developed fever to 101F, tachycardia, started on empiric antibiotics, unclear source.    After admission, it became clear that in addition to fever, patient was noted to have leukopenia, anemia, transaminitis, generalized weakness, and positive ANA, although without joint pain, rashes, evidence of serositis or any other focal findings of autoimmune disease.  GI and Hematology were consulted for evaluation of suspected autoimmune hepatitis and work up of anemia.     Assessment & Plan:  Sepsis secondary to unclear etiology Patient did have a temperature of 101.8, tachycardia, tachypnea and leukopenia at presentation.  Treated empirically with vancomycin and cefepime, 5 days of antibiotics, discontinued and sepsis physiology appeared resolved.    Blood cultures show no growth to date, urine culture negative.  Hepatitis panel, influenza, CMV and EBV titers unremarkable.  Unclear source of infection.     Abnormal LFTs/transaminitis Autoimmune hepatitis ruled out Drug induced hepatitis not ruled out Noted to have LFTs in 400s on admission.  Patient resides in a group home, unclear if she uses alcohol, but it seems unlikely.  Was placed on CIWA protocol empirically, later discontinued.    Right upper quadrant ultrasound showed coarse echotexture of the hepatic parenchyma suggesting diffuse hepatocellular disease, possible hepatitis. MRCP did not show any hepatic lesion.  No biliary duct dilatation or gallbladder sludge.    UDS, alcohol level and salicylate level unremarkable on admission. Hepatitis serologies unremarkable.  Gastroenterology  consulted.  Underwent liver biopsy, discussed with Pathology, the liver biopsy results do not show inflammation consistent with AH, more consistent with a drug induced hepatitis, less likely a nonspecific autoimmune inflammation.  Abilify has not been reported to cause liver injury.  Lexapro causes liver injury in <1% of cases, making this very unlikely, although chronicity is interesting because this appears to be started within the last month.    The only other likely contributor is that the patient was on (apparently) scheduled acetaminophen PTA.  Although her acetaminophen level was negative at admission, it is possible we have witnessed a Tylenol liver injury that preceded admission.  If this were the case, it would continue to resolve with holding acetaminophen and supportive care.  -SNF will need to arrange GI follow up with Dr. Fuller [facility] at [facility] was stopped -Avoid acetaminophen -Trend LFTs weekly     Pancytopenia Patient presented with thrombocytopenia, anemia and leukopenia.   Hematology oncology consulted. Bone marrow biopsy showed no Leukemia, Lymphoma or evidence of Cancer.  Cytogenetics normal. There was no obvious Dysplasia or MDS.   Patient was transfused 1 unit PRBC and counts remained stable.      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  Early evaluation for hepatitis included ANA, which was positive, subsequent work up showed:  -LDH 1063 -CRP 1.1, ESR 65 -Markedly positive double-stranded DNA, anti-RNP, anti-Smith, SSA, SSB, anti-chromatin, anti-smooth muscle.   -Negative anti-Jo, negative anticentromere, negative scleroderma.   -Antimitochondrial antibodies negative.  Anti-tissue transglutaminase negative, anti-reticulin antibodies negative, IgG testing negative, antigliadin negative.   -HSV-2 IgG positive.  Hepatitis A antibody negative, hepatitis B sAg.     -Ceruloplasmin borderline low.   -  CMV IgM negative, EBV IgM negative.   -DAT positive, haptoglobin low.   -HIT screen positive, SRA weakly positive.   -Anti-LK microsomal antibodies negative.  She was started on prednisone 20 mg and Plaquenil.   Perhaps slow gradual improvement in energy in last few days.  No new joint pains, fever, confusion, rash.    -Continue hydroxychloroquine 200 mg, prednisone 20 mg daily -Records have been faxed to her PCP Dr. Alanson Puls office, and they have made referral to the [facility].  SNF will need to coordinate with Dr. Arelia Sneddon' office to confirm rheum referral is in place, and coordinate transportation     Weakness CK normal.  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  This seems to be resolving.  I do not suspect a primary neurological issue.  Orthostatic hypotension Resolved.  Hypophosphatemia/ Hypomagnesemia/ Hypokalemia/ Hypocalcemia Resolved.  Bipolar 1 disorder versus other underlying psychiatric disorder -Continue aripiprazole -Lexapro was stopped after liver biopsy result showed possible toxic injury  Severe protein calorie malnutrition -Continue Ensure -Nutrition consulted  Hypertension BP normal -Continue metoprolol  Stage II sacrum, not POA      MDM and disposition: Below labs and imaging reports were reviewed and summarized above.  Medication management as above.  The patient was admitted with fall, found to have sepsis.  Ultimately she was found to have positive ANA, leukopenia, and anemia, and hepatitis, which were thought to be a constellation of rheumatologic disease, unclear etiology.  She has numerous positive autoimmune serologies.  She was started on Plaquenil and prednisone, and arrangements are being made for rheumatology follow-up.    Patient remained significantly debilitated from her baseline requiring substantial assistance to get out of bed, ambulate, take care  of activities of daily living.  At present, her group home lacks resources to provide this support and social work is progressing towards arrangements for safe discharge to skilled nursing facility.     DVT prophylaxis: SCDs Code Status: FULL Family Communication: By phone    Consultants:   GI  Heme  Procedures:   Bone marrow biopsy  Liver biopsy  Antimicrobials:   Vanc  11/20 >> 11/23  CEfepime 11/20 >> 11/23   Subjective: No change to fatigue and weakness, perhaps slightly better than several days ago.  Ambulating with assistance only.  No new fever, confusion, rashes, joint pains, nausea, vomiting, chest pain, dyspnea, pleuritic pain.        Objective: Vitals:   06/18/18 0512 06/18/18 1447 06/18/18 1959 06/19/18 0451  BP: '99/63 99/69 93/65 ' 109/74  Pulse: 79 77 77 70  Resp: '16 16 16 16  ' Temp: 98.4 F (36.9 C) 98.6 F (37 C) 98.2 F (36.8 C) 98.9 F (37.2 C)  TempSrc: Oral Oral Oral Oral  SpO2: 98% 100% 100% 100%  Weight:      Height:        Intake/Output Summary (Last 24 hours) at 06/19/2018 1316 Last data filed at 06/19/2018 0908 Gross per 24 hour  Intake 120 ml  Output -  Net 120 ml   Filed Weights   05/30/18 0854 06/06/18 0956  Weight: 46.3 kg 46.3 kg    Examination: General appearance: Thin frail adult female.  Sitting in recliner.  Affect blunted HEENT: Anicteric, conjunctival pink, lids and lashes normal.  No nasal deformity, discharge, or epistaxis.  Lips moist, oropharynx moist, no oral lesions, hearing normal. Skin: Skin warm and dry without suspicious rashes or lesions on the face, neck, upper chest, arms, or legs.  Cardiac: RRR, no murmurs, JVP normal, no lower extremity edema.   Respiratory: Normal respiratory effort, lungs clear without rales or wheezes. Abdomen: Soft without tenderness to palpation or distention.. MSK: No new joint effusions or redness or swelling.  Diffuse loss of subcutaneous muscle mass and fat. Neuro: Awake  and alert, extraocular movements intact, face symmetric, speech fluent.  Strength globally 4/5, symmetric.Marland Kitchen Psych: Responds appropriately to questions, psychomotor slowing, affect flat.    Data Reviewed: I have personally reviewed following labs and imaging studies:  CBC: Recent Labs  Lab 06/14/18 0548 06/16/18 0730  WBC 4.0 3.0*  HGB 9.5* 9.1*  HCT 31.1* 30.2*  MCV 91.7 94.1  PLT 208 458   Basic Metabolic Panel: Recent Labs  Lab 06/14/18 0548 06/16/18 0730  NA 143 142  K 3.6 3.4*  CL 104 106  CO2 32 29  GLUCOSE 83 79  BUN 21* 17  CREATININE 0.59 0.59  CALCIUM 8.9 8.6*   GFR: Estimated Creatinine Clearance: 59.4 mL/min (by C-G formula based on SCr of 0.59 mg/dL). Liver Function Tests: Recent Labs  Lab 06/14/18 0548 06/16/18 0730  AST 250* 155*  ALT 129* 97*  ALKPHOS 311* 232*  BILITOT 1.0 0.7  PROT 7.5 7.3  ALBUMIN 2.9* 2.8*   No results for input(s): LIPASE, AMYLASE in the last 168 hours. No results for input(s): AMMONIA in the last 168 hours. Coagulation Profile: No results for input(s): INR, PROTIME in the last 168 hours. Cardiac Enzymes: No results for input(s): CKTOTAL, CKMB, CKMBINDEX, TROPONINI in the last 168 hours. BNP (last 3 results) No results for input(s): PROBNP in the last 8760 hours. HbA1C: No results for input(s): HGBA1C in the last 72 hours. CBG: No results for input(s): GLUCAP in the last 168 hours. Lipid Profile: No results for input(s): CHOL, HDL, LDLCALC, TRIG, CHOLHDL, LDLDIRECT in the last 72 hours. Thyroid Function Tests: No results for input(s): TSH, T4TOTAL, FREET4, T3FREE, THYROIDAB in the last 72 hours. Anemia Panel: No results for input(s): VITAMINB12, FOLATE, FERRITIN, TIBC, IRON, RETICCTPCT in the last 72 hours. Urine analysis:    Component Value Date/Time   COLORURINE YELLOW 05/24/2018 0111   APPEARANCEUR HAZY (A) 05/24/2018 0111   LABSPEC 1.017 05/24/2018 0111   PHURINE 5.0 05/24/2018 0111   GLUCOSEU NEGATIVE  05/24/2018 0111   HGBUR NEGATIVE 05/24/2018 0111   BILIRUBINUR NEGATIVE 05/24/2018 0111   KETONESUR NEGATIVE 05/24/2018 0111   PROTEINUR NEGATIVE 05/24/2018 0111   UROBILINOGEN 0.2 02/27/2014 0501   NITRITE NEGATIVE 05/24/2018 0111   LEUKOCYTESUR SMALL (A) 05/24/2018 0111   Sepsis Labs: '@LABRCNTIP' (procalcitonin:4,lacticacidven:4)  )No results found for this or any previous visit (from the past 240 hour(s)).       Radiology Studies: No results found.      Scheduled Meds: . ARIPiprazole  15 mg Oral Daily  . escitalopram  5 mg Oral Daily  . feeding supplement (ENSURE ENLIVE)  237 mL Oral BID BM  . folic acid  1 mg Oral Daily  . hydroxychloroquine  200 mg Oral Daily  . metoprolol tartrate  25 mg Oral BID  . multivitamin with minerals  1 tablet Oral Daily  . predniSONE  20 mg Oral Q breakfast  . thiamine  100 mg Oral Daily   Or  . thiamine  100 mg Intravenous Daily   Continuous Infusions:   LOS: 20 days    Time spent: 25 minutes     Edwin Dada, MD [facility] 06/19/2018, 1:16 PM     Please page  through AMION:  [email or web address] Password [email or web address] If 7PM-7AM, please contact night-coverage

## 2018-06-20 LAB — COMPREHENSIVE METABOLIC PANEL
ALT: 76 U/L — AB (ref 0–44)
AST: 85 U/L — ABNORMAL HIGH (ref 15–41)
Albumin: 2.9 g/dL — ABNORMAL LOW (ref 3.5–5.0)
Alkaline Phosphatase: 162 U/L — ABNORMAL HIGH (ref 38–126)
Anion gap: 10 (ref 5–15)
BUN: 18 mg/dL (ref 6–20)
CHLORIDE: 106 mmol/L (ref 98–111)
CO2: 28 mmol/L (ref 22–32)
Calcium: 9 mg/dL (ref 8.9–10.3)
Creatinine, Ser: 0.55 mg/dL (ref 0.44–1.00)
GFR calc Af Amer: >60 mL/min (ref >60)
GFR calc non Af Amer: >60 mL/min (ref >60)
Glucose, Bld: 77 mg/dL (ref 70–99)
Potassium: 3.7 mmol/L (ref 3.5–5.1)
Sodium: 144 mmol/L (ref 135–145)
Total Bilirubin: 0.6 mg/dL (ref 0.3–1.2)
Total Protein: 7.8 g/dL (ref 6.5–8.1)

## 2018-06-20 LAB — CBC
HEMATOCRIT: 34.7 % — AB (ref 36.0–46.0)
Hemoglobin: 10.6 g/dL — ABNORMAL LOW (ref 12.0–15.0)
MCH: 28.3 pg (ref 26.0–34.0)
MCHC: 30.5 g/dL (ref 30.0–36.0)
MCV: 92.5 fL (ref 80.0–100.0)
Platelets: 223 10*3/uL (ref 150–400)
RBC: 3.75 MIL/uL — ABNORMAL LOW (ref 3.87–5.11)
RDW: 17.5 % — ABNORMAL HIGH (ref 11.5–15.5)
WBC: 4.1 10*3/uL (ref 4.0–10.5)
nRBC: 0.5 % — ABNORMAL HIGH (ref 0.0–0.2)

## 2018-06-20 NOTE — Progress Notes (Signed)
Nutrition Follow-up  DOCUMENTATION CODES:   Underweight  INTERVENTION:   -Continue Ensure Enlive po BID, each supplement provides 350 kcal and 20 grams of protein -Will order for weight measurement  NUTRITION DIAGNOSIS:   Inadequate oral intake related to (mental status) as evidenced by per patient/family report, meal completion < 25%.  Improving.  GOAL:   Patient will meet greater than or equal to 90% of their needs  Progressing.  MONITOR:   PO intake, Supplement acceptance, Labs, Weight trends, I & O's  REASON FOR ASSESSMENT:   Consult Assessment of nutrition requirement/status  ASSESSMENT:   52 y.o. female with medical history significant for bipolar disorder who resides in a group home and presented to the emergency room after having a fall following lightheadedness.  Patient currently consuming 85-100% of meals at this time. Appetite improving. Pt is drinking at least 1 Ensure daily.  No new weights measured since 11/26.   Labs reviewed. Medications: Folic acid tablet daily, Multivitamin with minerals daily. Thiamine tablet daily  Diet Order:   Diet Order            Diet regular Room service appropriate? Yes; Fluid consistency: Thin  Diet effective now              EDUCATION NEEDS:   Not appropriate for education at this time  Skin:  Skin Assessment: Skin Integrity Issues: Skin Integrity Issues:: Stage II Stage II: sacrum Other: pressure injury on sacrum  Last BM:  12/10  Height:   Ht Readings from Last 1 Encounters:  05/30/18 5\' 2"  (1.575 m)    Weight:   Wt Readings from Last 1 Encounters:  06/06/18 46.3 kg    Ideal Body Weight:  50 kg  BMI:  Body mass index is 18.67 kg/m.  Estimated Nutritional Needs:   Kcal:  1400-1600  Protein:  65-75g  Fluid:  1.6L/day  Tilda FrancoLindsey Yachet Mattson, MS, RD, LDN [facility] Clinical Dietitian Pager: [phone] After Hours Pager: [phone]

## 2018-06-20 NOTE — Care Management Note (Signed)
Case Management Note  Patient Details  Name: Yvonne [name] MRN: [id number] Date of Birth: [birth year]  Subjective/Objective:                  snf placement-CSW working on passar  Action/Plan: Following for progression of care and condition. Following for cm needs none present at this time.  Expected Discharge Date:  (unknown)               Expected Discharge Plan:  Skilled Nursing Facility  In-House Referral:  Clinical Social Work  Discharge planning Services  CM Consult  Post Acute Care Choice:    Choice offered to:     DME Arranged:    DME Agency:     HH Arranged:    HH Agency:     Status of Service:  In process, will continue to follow  If discussed at Long Length of Stay Meetings, dates discussed:    Additional Comments:  Golda AcreDavis, Rhonda Lynn, RN 06/20/2018, 10:21 AM

## 2018-06-20 NOTE — Progress Notes (Signed)
PROGRESS NOTE    Mana Haberl  LKT:[id number] DOB: 1965-11-28 DOA: 05/30/2018 PCP: Leonard Downing, MD      Brief Narrative:  Mrs. Luster is a 52 y.o. F with bipolar disorder, lives in group home, who presented with lightheadedness and fall.  Found to have significant orthostatic hypotension.  Following admission to the floor, patient developed fever to 101F, tachycardia, started on empiric antibiotics, unclear source.    After admission, it became clear that in addition to fever, patient was noted to have leukopenia, anemia, transaminitis, generalized weakness, and positive ANA, although without joint pain, rashes, evidence of serositis or any other focal findings of autoimmune disease.  GI and Hematology were consulted for evaluation of suspected autoimmune hepatitis and work up of anemia.         Assessment & Plan:  Sepsis secondary to unclear etiology Patient did have a temperature of 101.8, tachycardia, tachypnea and leukopenia at presentation.  Treated empirically with vancomycin and cefepime, 5 days of antibiotics, discontinued and sepsis physiology appeared resolved.    Blood cultures show no growth to date, urine culture negative.  Hepatitis panel, influenza, CMV and EBV titers unremarkable.  Unclear source of infection.     Abnormal LFTs/transaminitis Autoimmune hepatitis ruled out Drug induced hepatitis not ruled out Noted to have LFTs in 400s on admission.  Patient resides in a group home, unclear if she uses alcohol, but it seems unlikely.  Was placed on CIWA protocol empirically, later discontinued.    Right upper quadrant ultrasound showed coarse echotexture of the hepatic parenchyma suggesting diffuse hepatocellular disease, possible hepatitis. MRCP did not show any hepatic lesion.  No biliary duct dilatation or gallbladder sludge.    UDS, alcohol level and salicylate level unremarkable on admission. Hepatitis serologies unremarkable.  Gastroenterology  consulted.  Underwent liver biopsy, discussed with Pathology, the liver biopsy results do not show inflammation consistent with AH, more consistent with a drug induced hepatitis, less likely a nonspecific autoimmune inflammation.  Abilify has not been reported to cause liver injury.  Lexapro causes liver injury in <1% of cases, making this very unlikely, although chronicity is interesting because this appears to be started within the last month.    The only other likely contributor is that the patient was on (apparently) scheduled acetaminophen PTA.  Although her acetaminophen level was negative at admission, it is possible we have witnessed a Tylenol liver injury that preceded admission.  If this were the case, it would continue to resolve with holding acetaminophen and supportive care.  -SNF will need to arrange GI follow up with Dr. Fuller [facility] at [facility] was stopped -Avoid acetaminophen -Trend LFTs weekly     Pancytopenia Patient presented with thrombocytopenia, anemia and leukopenia.   Hematology oncology consulted. Bone marrow biopsy showed no Leukemia, Lymphoma or evidence of Cancer.  Cytogenetics normal. There was no obvious Dysplasia or MDS.   Patient was transfused 1 unit PRBC and counts remained stable.      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  Early evaluation for hepatitis included ANA, which was positive, subsequent work up showed:  -LDH 1063 -CRP 1.1, ESR 65 -Markedly positive double-stranded DNA, anti-RNP, anti-Smith, SSA, SSB, anti-chromatin, anti-smooth muscle.   -Negative anti-Jo, negative anticentromere, negative scleroderma.   -Antimitochondrial antibodies negative.  Anti-tissue transglutaminase negative, anti-reticulin antibodies negative, IgG testing negative, antigliadin negative.   -HSV-2 IgG positive.  Hepatitis A antibody negative, hepatitis B sAg.     -  Ceruloplasmin borderline low.   -CMV IgM negative, EBV IgM negative.   -DAT positive, haptoglobin low.   -HIT screen positive, SRA weakly positive.   -Anti-LK microsomal antibodies negative.  She was started on prednisone 20 mg and Plaquenil.   Perhaps slow gradual improvement in energy in last few days.  No new joint pains, fever, confusion, rash.    -Continue hydroxychloroquine 200 mg, prednisone 20 mg daily -Records have been faxed to her PCP Dr. Alanson Puls office, and they have made referral to the [facility].  SNF will need to coordinate with Dr. Arelia Sneddon' office to confirm rheum referral is in place, and coordinate transportation     Weakness CK normal.  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  This seems to be resolving.  I do not suspect a primary neurological issue.  Orthostatic hypotension Resolved.  Hypophosphatemia/ Hypomagnesemia/ Hypokalemia/ Hypocalcemia Resolved.  Bipolar 1 disorder versus other underlying psychiatric disorder -Continue Abilify -Lexapro was stopped after liver biopsy result showed possible toxic injury  Severe protein calorie malnutrition -Continue Ensure -Nutrition consulted  Hypertension Blood pressure soft -Stop metoprolol  Stage II sacrum, not POA      MDM and disposition: The below labs and imaging reports reviewed and summarized above.  Medication management as above.  The patient was admitted with fall, found to have sepsis.  Ultimately she was found to have positive ANA, leukopenia, and anemia, and hepatitis, which were thought to be a constellation of rheumatologic disease, unclear etiology.  She has numerous positive autoimmune serologies.  She was started on Plaquenil and prednisone, and arrangements are being made for rheumatology follow-up.      The patient remains debilitated from her baseline (independent for all self-cares, ambulatory without assistance), and requires  substantial assistance to get out of bed, ambulate, perform ADLs.  At present, her group home lacks resources to provide the support, and social work is progressing towards arrangements for safe discharge to skilled nursing.        DVT prophylaxis: SCDs Code Status: FULL Family Communication: None present    Consultants:   GI  Heme  Procedures:   Bone marrow biopsy  Liver biopsy  Antimicrobials:   Vanc  11/20 >> 11/23  CEfepime 11/20 >> 11/23   Subjective: Still somewhat tired, but appetite good, able to ambulate with assistance.  No new fever, rash, joint pain or swelling.  No nausea, vomiting, chest pain, dyspnea, pleuritic pain.  No hallucination psychosis.         Objective: Vitals:   06/19/18 2213 06/20/18 0611 06/20/18 1004 06/20/18 1341  BP: 111/76 101/75 99/73 106/74  Pulse:  85 (!) 120 98  Resp:  18  18  Temp:  98.4 F (36.9 C)  98.3 F (36.8 C)  TempSrc:  Oral    SpO2:  100% 100% 100%  Weight:      Height:        Intake/Output Summary (Last 24 hours) at 06/20/2018 1444 Last data filed at 06/20/2018 1400 Gross per 24 hour  Intake 1320 ml  Output -  Net 1320 ml   Filed Weights   05/30/18 0854 06/06/18 0956  Weight: 46.3 kg 46.3 kg    Examination: General appearance: Thin frail adult female.  Sitting in recliner, interactive.  No acute distress. HEENT: Anicteric, conjunctival pink, lids and lashes normal.  No nasal deformity, discharge, or epistaxis.  Lips moist, oropharynx moist, no oral lesions, hearing normal. Skin: Skin warm and dry without suspicious rashes or  lesions. Cardiac: RRR, no murmurs, JVP normal, no lower extremity edema.   Respiratory: Normal respiratory effort, lungs clear without rales or wheezes. Abdomen: Soft without tenderness to palpation or distention. MSK: No new joint effusions or redness or swelling.  Diffuse loss of subcutaneous muscle mass and fat. Neuro: Alert, extraocular movements intact, face symmetric.   Speech fluent.  Strength globally 4/5, symmetric, coordination normal, gait shuffling and unsteady. Psych: Appropriate to questions, psychomotor slowing is marked, affect is flat.  Mood and insight appear normal.    Data Reviewed: I have personally reviewed following labs and imaging studies:  CBC: Recent Labs  Lab 06/14/18 0548 06/16/18 0730 06/20/18 0609  WBC 4.0 3.0* 4.1  HGB 9.5* 9.1* 10.6*  HCT 31.1* 30.2* 34.7*  MCV 91.7 94.1 92.5  PLT 208 164 500   Basic Metabolic Panel: Recent Labs  Lab 06/14/18 0548 06/16/18 0730 06/20/18 0609  NA 143 142 144  K 3.6 3.4* 3.7  CL 104 106 106  CO2 32 29 28  GLUCOSE 83 79 77  BUN 21* 17 18  CREATININE 0.59 0.59 0.55  CALCIUM 8.9 8.6* 9.0   GFR: Estimated Creatinine Clearance: 59.4 mL/min (by C-G formula based on SCr of 0.55 mg/dL). Liver Function Tests: Recent Labs  Lab 06/14/18 0548 06/16/18 0730 06/20/18 0609  AST 250* 155* 85*  ALT 129* 97* 76*  ALKPHOS 311* 232* 162*  BILITOT 1.0 0.7 0.6  PROT 7.5 7.3 7.8  ALBUMIN 2.9* 2.8* 2.9*   No results for input(s): LIPASE, AMYLASE in the last 168 hours. No results for input(s): AMMONIA in the last 168 hours. Coagulation Profile: No results for input(s): INR, PROTIME in the last 168 hours. Cardiac Enzymes: No results for input(s): CKTOTAL, CKMB, CKMBINDEX, TROPONINI in the last 168 hours. BNP (last 3 results) No results for input(s): PROBNP in the last 8760 hours. HbA1C: No results for input(s): HGBA1C in the last 72 hours. CBG: No results for input(s): GLUCAP in the last 168 hours. Lipid Profile: No results for input(s): CHOL, HDL, LDLCALC, TRIG, CHOLHDL, LDLDIRECT in the last 72 hours. Thyroid Function Tests: No results for input(s): TSH, T4TOTAL, FREET4, T3FREE, THYROIDAB in the last 72 hours. Anemia Panel: No results for input(s): VITAMINB12, FOLATE, FERRITIN, TIBC, IRON, RETICCTPCT in the last 72 hours. Urine analysis:    Component Value Date/Time   COLORURINE  YELLOW 05/24/2018 0111   APPEARANCEUR HAZY (A) 05/24/2018 0111   LABSPEC 1.017 05/24/2018 0111   PHURINE 5.0 05/24/2018 0111   GLUCOSEU NEGATIVE 05/24/2018 0111   HGBUR NEGATIVE 05/24/2018 0111   BILIRUBINUR NEGATIVE 05/24/2018 0111   KETONESUR NEGATIVE 05/24/2018 0111   PROTEINUR NEGATIVE 05/24/2018 0111   UROBILINOGEN 0.2 02/27/2014 0501   NITRITE NEGATIVE 05/24/2018 0111   LEUKOCYTESUR SMALL (A) 05/24/2018 0111   Sepsis Labs: '@LABRCNTIP' (procalcitonin:4,lacticacidven:4)  )No results found for this or any previous visit (from the past 240 hour(s)).       Radiology Studies: No results found.      Scheduled Meds: . ARIPiprazole  15 mg Oral Daily  . feeding supplement (ENSURE ENLIVE)  237 mL Oral BID BM  . folic acid  1 mg Oral Daily  . hydroxychloroquine  200 mg Oral Daily  . metoprolol tartrate  25 mg Oral BID  . multivitamin with minerals  1 tablet Oral Daily  . predniSONE  20 mg Oral Q breakfast  . thiamine  100 mg Oral Daily   Or  . thiamine  100 mg Intravenous Daily   Continuous Infusions:  LOS: 21 days    Time spent: 25 minutes     Edwin Dada, MD [facility] 06/20/2018, 2:44 PM     Please page through [place]:  [email or web address] Password [email or web address] If 7PM-7AM, please contact night-coverage

## 2018-06-20 NOTE — Progress Notes (Signed)
Patient has 30 day PASRR [phone] E expires 07/20/18  According to PT not on 06/19/18 patient is almost back to baseline.   Patient access assisting with medicaid issues.   Beulah GandyBernette Buchholz, LSCW [facility] CSW [phone]

## 2018-06-21 ENCOUNTER — Ambulatory Visit: Payer: Self-pay | Admitting: Physician Assistant

## 2018-06-21 ENCOUNTER — Telehealth: Payer: Self-pay

## 2018-06-21 NOTE — Progress Notes (Signed)
Occupational Therapy Treatment Patient Details Name: Yvonne BalsamMelvine Lett MRN: [id number] DOB: 02/27/1966 Today's Date: 06/21/2018    History of present illness 52 yo female presented to ED after a fall. Found to have orthostatic hypotension, fever, sepsis-unclear etiology, possible autoimmune disease. Patient resides in a group home.     OT comments  Pt pleasant and willing to get OOB. Pt had tray in bed and not able to feed self in position that she was in.    Follow Up Recommendations  SNF    Equipment Recommendations  3 in 1 bedside commode    Recommendations for Other Services      Precautions / Restrictions Precautions Precautions: Fall Restrictions Weight Bearing Restrictions: No       Mobility Bed Mobility Overal bed mobility: Independent Bed Mobility: Supine to Sit     Supine to sit: Supervision     General bed mobility comments: independent with return to bed  Transfers   Equipment used: Rolling walker (2 wheeled);None Transfers: Sit to/from Stand Sit to Stand: Min guard Stand pivot transfers: Min guard       General transfer comment: pt able to self rise and lower safely using hands from recliner, bed and toilet.    Balance Overall balance assessment: Needs assistance;History of Falls Sitting-balance support: Feet supported;No upper extremity supported Sitting balance-Leahy Scale: Good     Standing balance support: During functional activity;No upper extremity supported Standing balance-Leahy Scale: Fair Standing balance comment: performed toileting and  mobilized in BR, washed hands and tossed trash.                           ADL either performed or assessed with clinical judgement   ADL Overall ADL's : Needs assistance/impaired Eating/Feeding: Set up;Sitting;Minimal assistance Eating/Feeding Details (indicate cue type and reason): min A with cutting food. S with self feeding Grooming: Wash/dry face;Sitting;Cueing for safety;Cueing for  sequencing                                 General ADL Comments: pt in bed trying to eat. Offered for pt to get OOB to chair and pt agreed.  Pt did need A cutting pancakes to bite size pieces.               Cognition Arousal/Alertness: Awake/alert Behavior During Therapy: Flat affect Overall Cognitive Status: Within Functional Limits for tasks assessed                                 General Comments: quiet and pleasant following all commands and conversive.                    Pertinent Vitals/ Pain       Pain Assessment: No/denies pain     Prior Functioning/Environment              Frequency  Min 2X/week        Progress Toward Goals  OT Goals(current goals can now be found in the care plan section)  Progress towards OT goals: Progressing toward goals  Acute Rehab OT Goals Patient Stated Goal: wants  be independent  Plan Discharge plan remains appropriate    Co-evaluation                 AM-PAC OT "6 Clicks" Daily Activity  Outcome Measure   Help from another person eating meals?: A Little Help from another person taking care of personal grooming?: A Little Help from another person toileting, which includes using toliet, bedpan, or urinal?: A Lot Help from another person bathing (including washing, rinsing, drying)?: A Lot Help from another person to put on and taking off regular upper body clothing?: A Little Help from another person to put on and taking off regular lower body clothing?: A Lot 6 Click Score: 15    End of Session Equipment Utilized During Treatment: Gait belt  OT Visit Diagnosis: Unsteadiness on feet (R26.81);Muscle weakness (generalized) (M62.81)   Activity Tolerance Patient tolerated treatment well   Patient Left in chair;with chair alarm set;with call bell/phone within reach   Nurse Communication Mobility status        Time: 1610-9604 OT Time Calculation (min): 16 min  Charges:  OT General Charges $OT Visit: 1 Visit OT Treatments $Self Care/Home Management : 8-22 mins  Lise Auer, OT Acute Rehabilitation Services [phone] Office- [phone]      Jeanie Sewer, Karin Golden D 06/21/2018, 4:33 PM

## 2018-06-21 NOTE — Progress Notes (Signed)
Per patient access [place] only [place] and does not have a payor source for facility stay.   LCSW notified CSW ChiropodistAssistant Director.   LCSW will continue to follow for disposition.   BKJ

## 2018-06-21 NOTE — Progress Notes (Signed)
Physical Therapy Treatment Patient Details Name: Yvonne BalsamMelvine [name] MRN: [id number] DOB: 12-31-1965 Today's Date: 06/21/2018    History of Present Illness 52 yo female presented to ED after a fall. Found to have orthostatic hypotension, fever, sepsis-unclear etiology, possible autoimmune disease. Patient resides in a group home.      PT Comments    The patient is improved in ambulation, able to ambulate without RW with min guard, not deemed safe for independent ambulation at this time. Will follow up with an objective balance/mobility test for fall risk assessment next visit.  Nursing reports inconsistencies with patient's performance and at times requires more assist than at other times. Continue PT.   Follow Up Recommendations  SNF(vs close supervision)     Equipment Recommendations  None recommended by PT    Recommendations for Other Services       Precautions / Restrictions Precautions Precautions: Fall Restrictions Weight Bearing Restrictions: No    Mobility  Bed Mobility Overal bed mobility: Independent Bed Mobility: Sit to Supine           General bed mobility comments: independent with return to bed  Transfers   Equipment used: Rolling walker (2 wheeled);None Transfers: Sit to/from Stand Sit to Stand: Supervision         General transfer comment: pt able to self rise and lower safely using hands from recliner, bed and toilet.  Ambulation/Gait Ambulation/Gait assistance: Min guard Gait Distance (Feet): 250 Feet Assistive device: Rolling walker (2 wheeled) Gait Pattern/deviations: Step-through pattern     General Gait Details: amb with walker first part with much improvement.  Amb without walker around the room  and to the bathroom. tending to hold onto bed and door for support.  Improved balance and stride but still below age range.     Stairs             Wheelchair Mobility    Modified Rankin (Stroke Patients Only)       Balance Overall  balance assessment: Needs assistance;History of Falls Sitting-balance support: Feet supported;No upper extremity supported Sitting balance-Leahy Scale: Good     Standing balance support: During functional activity;No upper extremity supported Standing balance-Leahy Scale: Fair Standing balance comment: performed toileting and  mobilized in BR, washed hands and tossed trash.                            Cognition Arousal/Alertness: Awake/alert Behavior During Therapy: Flat affect Overall Cognitive Status: Within Functional Limits for tasks assessed                                 General Comments: quiet and pleasant following all commands and conversive.  Patient reports that she is not at her baseline of being independent.      Exercises      General Comments        Pertinent Vitals/Pain Pain Assessment: No/denies pain    Home Living                      Prior Function            PT Goals (current goals can now be found in the care plan section) Acute Rehab PT Goals Patient Stated Goal: wants  be independent PT Goal Formulation: With patient Time For Goal Achievement: 07/05/18 Potential to Achieve Goals: Good Progress towards PT goals: Progressing toward goals  Frequency    Min 2X/week      PT Plan Current plan remains appropriate    Co-evaluation              AM-PAC PT "6 Clicks" Mobility   Outcome Measure  Help needed turning from your back to your side while in a flat bed without using bedrails?: None Help needed moving from lying on your back to sitting on the side of a flat bed without using bedrails?: A Little Help needed moving to and from a bed to a chair (including a wheelchair)?: A Little Help needed standing up from a chair using your arms (e.g., wheelchair or bedside chair)?: A Little Help needed to walk in hospital room?: A Little Help needed climbing 3-5 steps with a railing? : A Lot 6 Click Score:  18    End of Session Equipment Utilized During Treatment: Gait belt Activity Tolerance: Patient tolerated treatment well Patient left: in bed;with call bell/phone within reach Nurse Communication: Mobility status PT Visit Diagnosis: Muscle weakness (generalized) (M62.81)     Time: 1610-9604 PT Time Calculation (min) (ACUTE ONLY): 19 min  Charges:  $Gait Training: 8-22 mins                     Blanchard Kelch PT Acute Rehabilitation Services Pager [phone] Office [phone]    Rada Hay 06/21/2018, 4:31 PM

## 2018-06-21 NOTE — Progress Notes (Signed)
PROGRESS NOTE    Yvonne [name]  UXL:[id number] DOB: 30-Sep-1965 DOA: 05/30/2018 PCP: Leonard Downing, MD      Brief Narrative:  Mrs. Dlouhy is a 52 y.o. F with bipolar disorder, lives in group home, who presented with lightheadedness and fall.  Found to have significant orthostatic hypotension.  Following admission to the floor, patient developed fever to 101F, tachycardia, started on empiric antibiotics, unclear source.    After admission, it became clear that in addition to fever, patient was noted to have leukopenia, anemia, transaminitis, generalized weakness, and positive ANA, although without joint pain, rashes, evidence of serositis or any other focal findings of autoimmune disease.  GI and Hematology were consulted for evaluation of suspected autoimmune hepatitis and work up of anemia.         Assessment & Plan:  Sepsis secondary to unclear etiology Patient did have a temperature of 101.8, tachycardia, tachypnea and leukopenia at presentation.  Treated empirically with vancomycin and cefepime, 5 days of antibiotics, discontinued and sepsis physiology appeared resolved.    Blood cultures show no growth to date, urine culture negative.  Hepatitis panel, influenza, CMV and EBV titers unremarkable.  Unclear source of infection.     Abnormal LFTs/transaminitis Autoimmune hepatitis ruled out Drug induced hepatitis not ruled out Noted to have LFTs in 400s on admission.  Patient resides in a group home, unclear if she uses alcohol, but it seems unlikely.  Was placed on CIWA protocol empirically, later discontinued.    Right upper quadrant ultrasound showed coarse echotexture of the hepatic parenchyma suggesting diffuse hepatocellular disease, possible hepatitis. MRCP did not show any hepatic lesion.  No biliary duct dilatation or gallbladder sludge.    UDS, alcohol level and salicylate level unremarkable on admission. Hepatitis serologies unremarkable.  Gastroenterology  consulted.  Underwent liver biopsy, discussed with Pathology, the liver biopsy results do not show inflammation consistent with AH, more consistent with a drug induced hepatitis, less likely a nonspecific autoimmune inflammation.  Abilify has not been reported to cause liver injury.  Lexapro causes liver injury in <1% of cases, making this very unlikely, although chronicity is interesting because this appears to be started within the last month.    The only other likely contributor is that the patient was on (apparently) scheduled acetaminophen PTA.  Although her acetaminophen level was negative at admission, it is possible we have witnessed a Tylenol liver injury that preceded admission.  If this were the case, it would continue to resolve with holding acetaminophen and supportive care.  -SNF will need to arrange GI follow up with Dr. Fuller Plan at [facility] was stopped -Avoid acetaminophen -Trend LFTs weekly while in hospital     Pancytopenia Patient presented with thrombocytopenia, anemia and leukopenia.   Hematology oncology consulted. Bone marrow biopsy showed no Leukemia, Lymphoma or evidence of Cancer.  Cytogenetics normal. There was no obvious Dysplasia or MDS.   Patient was transfused 1 unit PRBC.  Hgb improved to 10s, stabilized.  Thrombocytopenia and leukopenia resolved. 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  Early evaluation for hepatitis included ANA, which was positive, subsequent work up showed:  -LDH 1063 -CRP 1.1, ESR 65 -Markedly positive double-stranded DNA, anti-RNP, anti-Smith, SSA, SSB, anti-chromatin, anti-smooth muscle.   -Negative anti-Jo, negative anticentromere, negative scleroderma.   -Antimitochondrial antibodies negative.  Anti-tissue transglutaminase negative, anti-reticulin antibodies negative, IgG testing negative, antigliadin  negative.   -HSV-2 IgG positive.  Hepatitis A antibody negative, hepatitis B sAg.   -Ceruloplasmin borderline low.   -CMV IgM negative, EBV IgM negative.   -DAT positive, haptoglobin low.   -HIT screen positive, SRA weakly positive.   -Anti-LK microsomal antibodies negative.  She was started on prednisone 20 mg and Plaquenil.   Perhaps slow gradual improvement in energy in last few days.  No new joint pains, fever, confusion, rash.    -Continue hydroxychloroquine 200 mg, prednisone 20 mg daily -Records have been faxed to her PCP Dr. Alanson Puls office, and they have made referral to the [facility].  SNF will need to coordinate with Dr. Arelia Sneddon' office to confirm rheum referral is in place, and coordinate transportation     Weakness CK normal.  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  I do not suspect a primary neurological issue. This seems to be resolving with steroids although she still requires assistance for transfers, nursing consider her a high fall risk.   Orthostatic hypotension Resolved.  Hypophosphatemia/ Hypomagnesemia/ Hypokalemia/ Hypocalcemia Resolved.  Bipolar 1 disorder versus other underlying psychiatric disorder -Continue Abilify -Lexapro was stopped after liver biopsy result showed possible toxic injury  Severe protein calorie malnutrition -Continue Ensure -Nutrition consulted  Hypertension Blood pressure soft -Metoprolol stopped  Stage II sacrum, not POA      MDM and disposition: The below labs and imaging reports were reviewed and summarized above.  Medication management as above.  The patient was admitted with fall, found to have sepsis.  Ultimately she was found to have positive ANA, leukopenia, and anemia, and hepatitis, which were thought to be a constellation of rheumatologic disease, unclear etiology.  She has numerous positive autoimmune serologies.  She was started on Plaquenil and prednisone,  and arrangements are being made for rheumatology follow-up.    Patient remains debilitated from her baseline (baseline she is independent for all self-cares, ambulatory without assistance, does not require assistance for transfers), but she at this point requires substantial assistance to get out of bed, ambulate, and perform ADLs.  She is a fall risk.  At present her group home lacks resources to provide support needed, and social work is progressing towards arrangements for safe discharge to skilled nursing.        DVT prophylaxis: SCDs Code Status: FULL Family Communication: None present    Consultants:   GI  Heme  Procedures:   Bone marrow biopsy  Liver biopsy  Antimicrobials:   Vanc  11/20 >> 11/23  CEfepime 11/20 >> 11/23   Subjective: Fatigued, appetite improving.  Ambulating with assistance.  No new fever, rash, joint swelling.  No nausea, vomiting, chest pain, dyspnea, pleuritic pain, hallucinations, psychosis.         Objective: Vitals:   06/20/18 1004 06/20/18 1341 06/20/18 2040 06/21/18 0452  BP: 99/73 106/74 109/86 100/68  Pulse: (!) 120 98 94 99  Resp:  _0 Temp:  98.3 F (36.8 C) 98.4 F (36.9 C) 98.2 F (36.8 C)  TempSrc:  Oral Oral Oral  SpO2: 100% 100% 100% 100%  Weight:      Height:        Intake/Output Summary (Last 24 hours) at 06/21/2018 1309 Last data filed at 06/21/2018 0829 Gross per 24 hour  Intake 1020 ml  Output -  Net 1020 ml   Filed Weights   05/30/18 0854 06/06/18 0956  Weight: 46.3 kg 46.3 kg    Examination: General appearance: Thin frail adult female.  Sitting in recliner.  Interactive.  No acute distress. HEENT: Anicteric, conjunctival pink, lids and lashes normal.  No nasal deformity, discharge, or epistaxis.  Lips moist, oropharynx moist, no oral lesions, hearing normal. Skin: Skin warm and dry without suspicious rashes or lesions. Cardiac: Regular rate and rhythm, no murmurs, no JVD, no lower  extremity edema. Respiratory: Normal respiratory effort, lungs clear without rales or wheezes. Abdomen: Soft without tenderness to palpation or distention. MSK: No new joint effusions or redness or swelling.  Diffuse loss of subcutaneous muscle mass and fat.  Temporal wasting. Neuro: Alert, extraocular movements intact, face symmetric.  Speech fluent.  Strength globally 4/5, symmetric, coordination normal, gait shuffling and unsteady. Psych: Appropriate to questions, psychomotor slowing noted, affect blunted, judgment and insight appear normal.    Data Reviewed: I have personally reviewed following labs and imaging studies:  CBC: Recent Labs  Lab 06/16/18 0730 06/20/18 0609  WBC 3.0* 4.1  HGB 9.1* 10.6*  HCT 30.2* 34.7*  MCV 94.1 92.5  PLT 164 703   Basic Metabolic Panel: Recent Labs  Lab 06/16/18 0730 06/20/18 0609  NA 142 144  K 3.4* 3.7  CL 106 106  CO2 29 28  GLUCOSE 79 77  BUN 17 18  CREATININE 0.59 0.55  CALCIUM 8.6* 9.0   GFR: Estimated Creatinine Clearance: 59.4 mL/min (by C-G formula based on SCr of 0.55 mg/dL). Liver Function Tests: Recent Labs  Lab 06/16/18 0730 06/20/18 0609  AST 155* 85*  ALT 97* 76*  ALKPHOS 232* 162*  BILITOT 0.7 0.6  PROT 7.3 7.8  ALBUMIN 2.8* 2.9*   No results for input(s): LIPASE, AMYLASE in the last 168 hours. No results for input(s): AMMONIA in the last 168 hours. Coagulation Profile: No results for input(s): INR, PROTIME in the last 168 hours. Cardiac Enzymes: No results for input(s): CKTOTAL, CKMB, CKMBINDEX, TROPONINI in the last 168 hours. BNP (last 3 results) No results for input(s): PROBNP in the last 8760 hours. HbA1C: No results for input(s): HGBA1C in the last 72 hours. CBG: No results for input(s): GLUCAP in the last 168 hours. Lipid Profile: No results for input(s): CHOL, HDL, LDLCALC, TRIG, CHOLHDL, LDLDIRECT in the last 72 hours. Thyroid Function Tests: No results for input(s): TSH, T4TOTAL, FREET4,  T3FREE, THYROIDAB in the last 72 hours. Anemia Panel: No results for input(s): VITAMINB12, FOLATE, FERRITIN, TIBC, IRON, RETICCTPCT in the last 72 hours. Urine analysis:    Component Value Date/Time   COLORURINE YELLOW 05/24/2018 0111   APPEARANCEUR HAZY (A) 05/24/2018 0111   LABSPEC 1.017 05/24/2018 0111   PHURINE 5.0 05/24/2018 0111   GLUCOSEU NEGATIVE 05/24/2018 0111   HGBUR NEGATIVE 05/24/2018 0111   BILIRUBINUR NEGATIVE 05/24/2018 0111   KETONESUR NEGATIVE 05/24/2018 0111   PROTEINUR NEGATIVE 05/24/2018 0111   UROBILINOGEN 0.2 02/27/2014 0501   NITRITE NEGATIVE 05/24/2018 0111   LEUKOCYTESUR SMALL (A) 05/24/2018 0111   Sepsis Labs: _0 (procalcitonin:4,lacticacidven:4)  )No results found for this or any previous visit (from the past 240 hour(s)).       Radiology Studies: No results found.      Scheduled Meds: . ARIPiprazole  15 mg Oral Daily  . feeding supplement (ENSURE ENLIVE)  237 mL Oral BID BM  . folic acid  1 mg Oral Daily  . hydroxychloroquine  200 mg Oral Daily  . multivitamin with minerals  1 tablet Oral Daily  . predniSONE  20 mg Oral Q breakfast  . thiamine  100 mg Oral Daily   Or  . thiamine  100 mg  Intravenous Daily   Continuous Infusions:   LOS: 22 days    Time spent: 25 minutes     Edwin Dada, MD [facility] 06/21/2018, 1:09 PM     Please page through [place]:  [email or web address] Password [email or web address] If 7PM-7AM, please contact night-coverage

## 2018-06-21 NOTE — Telephone Encounter (Signed)
[facility].  Patient is being discharged to a skilled nursing facility in the next few days.  I notified Pran, RN that referral has been initiated with [facility] to follow up hepatitis/ liver injury.    Referral faxed with cover letter sent to [facility] to call with questions.

## 2018-06-21 NOTE — Telephone Encounter (Signed)
Left message for patient to call back  

## 2018-06-21 NOTE — Progress Notes (Signed)
RN got a phone call from ChualarSheri, Charity fundraiserN at [facility] related to initiate referral to [facility] at [id number].

## 2018-06-21 NOTE — Telephone Encounter (Signed)
-----   Message from Meryl DareMalcolm T Stark, MD sent at 06/21/2018 12:35 PM EST ----- Per pathology report the patient has an acute hepatic injury without underlying chronic liver disease, etiology is unclear. She was a no show for an office appt today with JL.  Since we have not established a diagnosis I recommend further evaluation, management and follow up with a hepatologist. Please contact the patient and refer to [facility]. She will not need an appt with us if she is followed by a hepatologist.     ----- Message ----- From: Meredith PelGuenther, Paula M, NP Sent: 06/16/2018   9:18 AM EST To: Meryl DareMalcolm T Stark, MD  Hi,  This is the patient. We signed off after I left hospital but I reviewed our notes and looks like the report suggested "hepatitis" or drug induced injury BUT I don't see the actual report in her record for some reason.  PG

## 2018-06-21 NOTE — Telephone Encounter (Signed)
I spoke with the group home and she is still inpatient.  I will initiate referral to [facility]

## 2018-06-22 MED ORDER — PREDNISONE 20 MG PO TABS: 20.0000 mg | ORAL_TABLET | Freq: Every day | ORAL | 1 refills | Status: DC

## 2018-06-22 MED ORDER — HYDROXYCHLOROQUINE SULFATE 200 MG PO TABS: 200.0000 mg | ORAL_TABLET | Freq: Every day | ORAL | 1 refills | Status: AC

## 2018-06-22 NOTE — Discharge Summary (Signed)
Physician Discharge Summary  Yvonne [name] BZM:[id number] DOB: 1965/10/29 DOA: 05/30/2018  PCP: Leonard Downing, MD  Admit date: 05/30/2018 Discharge date: 06/22/2018  Admitted From: Home, group home Disposition: Skilled nursing facility  Recommendations for Outpatient Follow-up:  1. Follow up with PCP in 1-2 weeks 2. Please obtain CMP/CBC in one week 3. Please follow-up with your rheumatologist as an outpatient 4. Please continue taking your prednisone until you see the rheumatologist so they can taper it  Home Health: No Equipment/Devices:3 in 1   Discharge Condition: Stable CODE STATUS: Full Diet recommendation: Regular diet  Brief/Interim Summary:  #) Sepsis physiology:Patient initially was admitted with fevers and multiple abnormalities (see below).  She met sepsis criteria with leukopenia, tachycardia, fever.  She was initially on approximately 4 days of IV vancomycin and cefepime.  Blood cultures were negative.  Urine cultures were negative.  Viral panel for elevated LFTs, see below, were additionally negative.  This was thought to be secondary to rheumatologic disorder rather than an infection.  #) Autoimmune disease/rheumatologic disorder: Due to patient's pancytopenia hematology/oncology was consulted.  A bone marrow biopsy performed on 06/05/2018 showed only evidence of appropriate hematopoiesis.  There is no evidence of leukemia or lymphoma.  Cytogenetics were normal.  Patient was given 1 unit of packed red blood cells.  Due to thrombocytopenia a HIT assay was sent off which was positive.  Serotonin release assay assay was mildly positive.  Patient was thought to be possibly allergic to heparin though she did not have any clear clots.  This was placed on her allergy list.  Patient was noted to have multiple positive antibodies including positive double-stranded DNA, anti-RNP, anti-Smith, SSA, SSB, anti-chromatin, anti-smooth muscle.  She was started on prednisone and  Plaquenil.  Negative antibodies included anti-Jo, anticentromere, scleroderma, antimitochondrial antibodies, anti-tissue transglutaminase, antireticulin, IgG testing, antigliadin, HSV 2 IgG, hep A, hepatitis B, ceruloplasmin, CMV, EBV.  Anti-liver kidney microsomal antibodies were negative additionally.  Patient will have outpatient follow-up with GI.  Due to her prolonged inflammatory state hospitalization patient did become this condition and will require did require skilled nursing facility placement.  Of note patient CK was negative.  #) Abnormal LFTs/transaminitis: Patient was admitted with LFTs in the 400s.  Due to her residing in a group home there was thought that he could possibly be alcohol induced however patient did not have any clear history.  Urine drug screen was unremarkable.  Hepatitis serologies were negative.  Right upper quadrant ultrasound showed only evidence of hepatitis.  MRCP did not show any involvement of the bile duct.  Gastroenterology was consulted and a liver biopsy was performed on 06/09/2018 that was consistent with either viral or drug-induced hepatitis.  There was low suspicion for autoimmune hepatitis.  There was a thought that patient's escitalopram could be the cause and so this was discontinued.  Patient was follow-up as an outpatient with Dr. Fuller Plan at [facility].  Patient's LFTs down trended.  This should be monitored regularly as an outpatient.  #) Bipolar 1 disorder: Patient was continued on aripiprazole.  Her S-Citalopram was stopped due to concern about possible drug-induced liver injury.  #) Electrolyte normalities: These were supplemented and resolved.  #) Orthostatic hypotension: This resolved.  #) Hypertension: Patient carried this diagnosis as outpatient.  Her metoprolol was stopped due to hypotension.  Discharge Diagnoses:  Principal Problem:   Orthostatic hypotension Active Problems:   Bipolar I disorder, current or most recent episode manic, with  psychotic features (HCC)   Severe protein-calorie malnutrition (  HCC)   Hypocalcemia   Transaminitis   Fever   Thrombocytopenia (HCC)   Abnormal LFTs (liver function tests)   Pancytopenia (HCC)   Pressure injury of skin    Discharge Instructions  Discharge Instructions    Call MD for:  difficulty breathing, headache or visual disturbances   Complete by:  As directed    Call MD for:  hives   Complete by:  As directed    Call MD for:  persistant dizziness or light-headedness   Complete by:  As directed    Call MD for:  persistant nausea and vomiting   Complete by:  As directed    Call MD for:  redness, tenderness, or signs of infection (pain, swelling, redness, odor or green/yellow discharge around incision site)   Complete by:  As directed    Call MD for:  severe uncontrolled pain   Complete by:  As directed    Call MD for:  temperature >100.4   Complete by:  As directed    Diet - low sodium heart healthy   Complete by:  As directed    Discharge instructions   Complete by:  As directed    Please follow-up with your rheumatologist as an outpatient.  Please follow-up with your liver doctor as an outpatient.   Increase activity slowly   Complete by:  As directed      Allergies as of 06/22/2018      Reactions   Heparin    HITT      Medication List    STOP taking these medications   escitalopram 5 MG tablet Commonly known as:  LEXAPRO     TAKE these medications   acetaminophen 325 MG tablet Commonly known as:  TYLENOL Take 650 mg by mouth 3 (three) times daily.   ARIPiprazole 15 MG tablet Commonly known as:  ABILIFY Take 1 tablet (15 mg total) by mouth daily. For mood control   benztropine 0.5 MG tablet Commonly known as:  COGENTIN Take 1 tablet (0.5 mg total) by mouth daily. For prevention of drug induced tremors   hydroxychloroquine 200 MG tablet Commonly known as:  PLAQUENIL Take 1 tablet (200 mg total) by mouth daily. Start taking on:  June 23, 2018   hydrOXYzine 25 MG tablet Commonly known as:  ATARAX/VISTARIL Take 25 mg by mouth 3 (three) times daily as needed for anxiety.   predniSONE 20 MG tablet Commonly known as:  DELTASONE Take 1 tablet (20 mg total) by mouth daily with breakfast. Start taking on:  June 23, 2018   sennosides-docusate sodium 8.6-50 MG tablet Commonly known as:  SENOKOT-S Take 2 tablets by mouth at bedtime as needed for constipation.   Vitamin D (Ergocalciferol) 1.25 MG (50000 UT) Caps capsule Commonly known as:  DRISDOL Take 50,000 Units by mouth every 14 (fourteen) days.      Follow-up Information    Leonard Downing, MD.   Specialty:  [facility] Contact information: [place] [address] [phone]        Levin Erp, [place] Follow up on 06/21/2018.   Specialty:  Gastroenterology Why:  9:15 am Contact information: [address] [place] [address] [phone]          Allergies  Allergen Reactions  . Heparin     HITT    Consultations:  Gastroenterology  Hematology   Procedures/Studies: Dg Chest 2 View  Result Date: 05/30/2018 CLINICAL DATA:  Mental status change, possible fall,  forehead hematoma, intermittent urinary incompetence. Generalized weakness. EXAM: CHEST - 2 VIEW COMPARISON:  PA and lateral chest x-ray of May 10, 2018 FINDINGS: The lungs are well-expanded. There is no focal infiltrate. There is no pleural effusion. The heart and pulmonary vascularity are normal. The mediastinum is normal in width. The bony thorax exhibits no acute abnormality. IMPRESSION: There is no active cardiopulmonary disease. Electronically Signed   By: David  Martinique M.D.   On: 05/30/2018 09:54   Ct Head Wo Contrast  Result Date: 05/30/2018 CLINICAL DATA:  Generalized weakness, recent fall EXAM: CT HEAD WITHOUT CONTRAST TECHNIQUE: Contiguous axial images were obtained from the base of the skull through the vertex without  intravenous contrast. COMPARISON:  None. FINDINGS: Brain: No evidence of acute infarction, hemorrhage, hydrocephalus, extra-axial collection or mass lesion/mass effect. Vascular: No hyperdense vessel or unexpected calcification. Skull: Normal. Negative for fracture or focal lesion. Sinuses/Orbits: Mild partial opacification of the right frontal sinus and bilateral sphenoid sinuses. Mastoid air cells are clear. Other: Small extracranial hematoma overlying the left frontal bone (series 2/image 11). IMPRESSION: Small extracranial hematoma overlying the left frontal bone. No evidence of acute intracranial abnormality. Electronically Signed   By: Julian Hy M.D.   On: 05/30/2018 10:56   Ct Abdomen Pelvis W Contrast  Result Date: 05/30/2018 CLINICAL DATA:  Elevated liver function tests. Adult failure to thrive. EXAM: CT ABDOMEN AND PELVIS WITH CONTRAST TECHNIQUE: Multidetector CT imaging of the abdomen and pelvis was performed using the standard protocol following bolus administration of intravenous contrast. CONTRAST:  127m ISOVUE-300 IOPAMIDOL (ISOVUE-300) INJECTION 61% COMPARISON:  CT scan of April 04, 2010. FINDINGS: Lower chest: No acute abnormality. Hepatobiliary: No focal liver abnormality is seen. No gallstones, gallbladder wall thickening, or biliary dilatation. Pancreas: Unremarkable. No pancreatic ductal dilatation or surrounding inflammatory changes. Spleen: Normal in size without focal abnormality. Adrenals/Urinary Tract: Adrenal glands are unremarkable. Kidneys are normal, without renal calculi, focal lesion, or hydronephrosis. Bladder is unremarkable. Stomach/Bowel: The stomach appears normal. There is no evidence of bowel obstruction or inflammation. The appendix is not visualized. Vascular/Lymphatic: Mildly enlarged mesenteric and retroperitoneal adenopathy is noted, all of which are less than 1 cm in size, and most likely are inflammatory in etiology. Reproductive: Multiple small uterine  fibroids are noted. No adnexal abnormality is noted. Other: No abdominal wall hernia or abnormality. No abdominopelvic ascites. Musculoskeletal: No acute or significant osseous findings. IMPRESSION: Mildly enlarged mesenteric and retroperitoneal adenopathy is now noted, most likely inflammatory or reactive in etiology. Multiple small uterine fibroids are noted. Electronically Signed   By: JMarijo Conception M.D.   On: 05/30/2018 14:26   Mr Abdomen Mrcp Wo Contrast  Result Date: 06/01/2018 CLINICAL DATA:  Elevated liver function tests. Indeterminate lesion identified on ultrasound. MRI recommended. EXAM: MRI ABDOMEN WITHOUT CONTRAST  (INCLUDING MRCP) TECHNIQUE: Multiplanar multisequence MR imaging of the abdomen was performed. Heavily T2-weighted images of the biliary and pancreatic ducts were obtained, and three-dimensional MRCP images were rendered by post processing. COMPARISON:  Ultrasound 05/31/2018, CT 193810FINDINGS: Patient unable to complete the entire exam due to pain. Lower chest:  Bilateral small effusions and bibasilar atelectasis. Hepatobiliary: No focal hepatic lesion identified. No lesion corresponds to the hypodense lesion on comparison CT. Suspect lesion may represent focal fatty sparing. Opposed phase imaging was not performed due to patient discomfort. There is no intrahepatic or extrahepatic biliary duct dilatation. Pancreas: Normal pancreatic parenchymal intensity. No ductal dilatation or inflammation. Spleen: Spleen is mildly enlarged Adrenals/urinary tract: Adrenal glands and kidneys  are normal. Stomach/Bowel: Stomach and limited of the small bowel is unremarkable Vascular/Lymphatic: Abdominal aortic normal caliber. No retroperitoneal periportal lymphadenopathy. Musculoskeletal: No aggressive osseous lesion IMPRESSION: 1. No hepatic lesion identified. All sequences could not be completed due to patient termination of exam due to pain. Opposed phase imaging not performed or postcontrast  imaging. Lesion not identified on the provided sequences. Lesion may represent focal fatty sparing. As lesion is occult on CT and MRI not tolerated in this patient patient (at least currently), recommend follow-up ultrasound to demonstrate stability in 3-6 months. 2. Bibasilar atelectasis and effusions. 3. No biliary duct dilatation gallbladder sludge Electronically Signed   By: Suzy Bouchard M.D.   On: 06/01/2018 15:18   Mr 3d Recon At Scanner  Result Date: 06/01/2018 CLINICAL DATA:  Elevated liver function tests. Indeterminate lesion identified on ultrasound. MRI recommended. EXAM: MRI ABDOMEN WITHOUT CONTRAST  (INCLUDING MRCP) TECHNIQUE: Multiplanar multisequence MR imaging of the abdomen was performed. Heavily T2-weighted images of the biliary and pancreatic ducts were obtained, and three-dimensional MRCP images were rendered by post processing. COMPARISON:  Ultrasound 05/31/2018, [place] [address] FINDINGS: Patient unable to complete the entire exam due to pain. Lower chest:  Bilateral small effusions and bibasilar atelectasis. Hepatobiliary: No focal hepatic lesion identified. No lesion corresponds to the hypodense lesion on comparison CT. Suspect lesion may represent focal fatty sparing. Opposed phase imaging was not performed due to patient discomfort. There is no intrahepatic or extrahepatic biliary duct dilatation. Pancreas: Normal pancreatic parenchymal intensity. No ductal dilatation or inflammation. Spleen: Spleen is mildly enlarged Adrenals/urinary tract: Adrenal glands and kidneys are normal. Stomach/Bowel: Stomach and limited of the small bowel is unremarkable Vascular/Lymphatic: Abdominal aortic normal caliber. No retroperitoneal periportal lymphadenopathy. Musculoskeletal: No aggressive osseous lesion IMPRESSION: 1. No hepatic lesion identified. All sequences could not be completed due to patient termination of exam due to pain. Opposed phase imaging not performed or postcontrast imaging. Lesion not  identified on the provided sequences. Lesion may represent focal fatty sparing. As lesion is occult on CT and MRI not tolerated in this patient patient (at least currently), recommend follow-up ultrasound to demonstrate stability in 3-6 months. 2. Bibasilar atelectasis and effusions. 3. No biliary duct dilatation gallbladder sludge Electronically Signed   By: Suzy Bouchard M.D.   On: 06/01/2018 15:18   US Biopsy (liver)  Result Date: 06/09/2018 CLINICAL DATA:  Pancytopenia, transaminitis EXAM: ULTRASOUND-GUIDED CORE LIVER BIOPSY TECHNIQUE: An ultrasound guided liver biopsy was thoroughly discussed with the patient and questions were answered. The benefits, risks, alternatives, and complications were also discussed. The patient understands and wishes to proceed with the procedure. A verbal as well as written consent was obtained. Survey ultrasound of the liver was performed and an appropriate skin entry site was determined. Skin site was marked, prepped with Betadine, and draped in usual sterile fashion, and infiltrated locally with 1% lidocaine. Intravenous Fentanyl and Versed were administered as conscious sedation during continuous monitoring of the patient's level of consciousness and physiological / cardiorespiratory status by the radiology RN, with a total moderate sedation time of 10 minutes. A 17 gauge trocar needle was advanced under ultrasound guidance into the liver. 3 coaxial 18gauge core samples were then obtained through the guide needle. The guide needle was removed. Post procedure scans demonstrate no apparent complication. COMPLICATIONS: COMPLICATIONS None immediate FINDINGS: Survey ultrasound of the liver demonstrates no focal lesion or biliary ductal dilatation. Representative 18 gauge core biopsy samples obtained as above. IMPRESSION: 1. Technically successful ultrasound guided core liver biopsy.  Electronically Signed   By: Lucrezia Europe M.D.   On: 06/09/2018 16:08   Ct Biopsy  Result  Date: 06/05/2018 INDICATION: 52 year old with pancytopenia.  Request for bone marrow biopsy. EXAM: CT GUIDED BONE MARROW ASPIRATES AND BIOPSY Physician: Stephan Minister. Anselm Pancoast, MD MEDICATIONS: None. ANESTHESIA/SEDATION: Fentanyl 100 mcg IV; Versed 2.0 mg IV Moderate Sedation Time:  19 minutes The patient was continuously monitored during the procedure by the interventional radiology nurse under my direct supervision. COMPLICATIONS: None immediate. PROCEDURE: The procedure was explained to the patient. The risks and benefits of the procedure were discussed and the patient's questions were addressed. Informed consent was obtained from the patient. The patient was placed prone on CT table. Images of the pelvis were obtained. The right side of back was prepped and draped in sterile fashion. Maximal barrier sterile technique was utilized including caps, mask, sterile gowns, sterile gloves, sterile drape, hand hygiene and skin antiseptic. The skin and right posterior ilium were anesthetized with 1% lidocaine. 11 gauge bone needle was directed into the right ilium with CT guidance. Two aspirates and two core biopsies were obtained. Bandage placed over the puncture site. IMPRESSION: CT guided bone marrow aspiration and core biopsy. Electronically Signed   By: Markus Daft M.D.   On: 06/05/2018 12:50   Dg Chest Port 1 View  Result Date: 05/31/2018 CLINICAL DATA:  Fever EXAM: PORTABLE CHEST 1 VIEW COMPARISON:  05/30/2018 chest radiograph FINDINGS: This is a mildly low volume film. The cardiomediastinal silhouette is unremarkable. There is no evidence of focal airspace disease, pulmonary edema, suspicious pulmonary nodule/mass, pleural effusion, or pneumothorax. No acute bony abnormalities are identified. IMPRESSION: Low volume film without acute abnormality. Electronically Signed   By: Margarette Canada M.D.   On: 05/31/2018 15:16   Ct Bone Marrow Biopsy & Aspiration  Result Date: 06/05/2018 INDICATION: 52 year old with  pancytopenia.  Request for bone marrow biopsy. EXAM: CT GUIDED BONE MARROW ASPIRATES AND BIOPSY Physician: Stephan Minister. Anselm Pancoast, MD MEDICATIONS: None. ANESTHESIA/SEDATION: Fentanyl 100 mcg IV; Versed 2.0 mg IV Moderate Sedation Time:  19 minutes The patient was continuously monitored during the procedure by the interventional radiology nurse under my direct supervision. COMPLICATIONS: None immediate. PROCEDURE: The procedure was explained to the patient. The risks and benefits of the procedure were discussed and the patient's questions were addressed. Informed consent was obtained from the patient. The patient was placed prone on CT table. Images of the pelvis were obtained. The right side of back was prepped and draped in sterile fashion. Maximal barrier sterile technique was utilized including caps, mask, sterile gowns, sterile gloves, sterile drape, hand hygiene and skin antiseptic. The skin and right posterior ilium were anesthetized with 1% lidocaine. 11 gauge bone needle was directed into the right ilium with CT guidance. Two aspirates and two core biopsies were obtained. Bandage placed over the puncture site. IMPRESSION: CT guided bone marrow aspiration and core biopsy. Electronically Signed   By: Markus Daft M.D.   On: 06/05/2018 12:50   US Abdomen Limited Ruq  Result Date: 05/31/2018 CLINICAL DATA:  Abnormal liver function tests. EXAM: ULTRASOUND ABDOMEN LIMITED RIGHT UPPER QUADRANT COMPARISON:  CT scan of May 30, 2018. FINDINGS: Gallbladder: No gallstones or wall thickening visualized. No sonographic Murphy sign noted by sonographer. Mild amount of sludge is seen within gallbladder lumen. Common bile duct: Diameter: 3 mm which is within normal limits. Liver: 2.3 cm hypoechoic solid abnormality is seen in the right hepatic lobe which is not visualized on prior CT scan.  Coarse echotexture of hepatic parenchyma is noted suggesting diffuse hepatocellular disease. Hypoechoic areas are noted around the portal  triads suggesting edema potentially related to hepatic inflammation or adenopathy at porta hepatis region. Minimal ascites is noted around the liver. Portal vein is patent on color Doppler imaging with normal direction of blood flow towards the liver. IMPRESSION: Coarse echotexture of hepatic parenchyma is noted suggesting diffuse hepatocellular disease such as possible hepatitis. Hypoechoic areas are noted around the portal triads suggesting edema potentially related to hepatic inflammation, or lymphatic obstruction secondary to adenopathy at the porta hepatis region. Minimal ascites is noted around the liver. 2.3 cm rounded hypoechoic solid abnormality is seen in the right hepatic lobe which is not seen on prior CT scan. Further evaluation with MRI is recommended when patient can hold still to evaluate for possible neoplasm. Mild amount of sludge seen within gallbladder lumen. Electronically Signed   By: Marijo Conception, M.D.   On: 05/31/2018 12:42    Bone marrow biopsy 06/05/2018: Normal for age with no evidence of clonal lymphoma or leukemia  Liver biopsy 06/09/2018: Nonspecific autoimmune hepatitis, not consistent with classic autoimmune hepatitis but more consistent with either drug induced liver injury or viral hepatitis   Subjective:   Discharge Exam: Vitals:   06/21/18 2119 06/22/18 0437  BP: 108/69 98/72  Pulse:  87  Resp:  16  Temp:  98.5 F (36.9 C)  SpO2:  100%   Vitals:   06/21/18 0452 06/21/18 1929 06/21/18 2119 06/22/18 0437  BP: 100/68 (!) 82/59 108/69 98/72  Pulse: 99 93  87  Resp: _0 Temp: 98.2 F (36.8 C) 98.7 F (37.1 C)  98.5 F (36.9 C)  TempSrc: Oral Oral  Oral  SpO2: 100% 100%  100%  Weight:      Height:        General appearance: Thin frail adult female.  Sitting in recliner.  Interactive.  No acute distress. HEENT: Anicteric, conjunctival pink, lids and lashes normal.  No nasal deformity, discharge, or epistaxis.  Lips moist, oropharynx moist,  no oral lesions, hearing normal. Skin: Skin warm and dry without suspicious rashes or lesions. Cardiac: Regular rate and rhythm, no murmurs, no JVD, no lower extremity edema. Respiratory: Normal respiratory effort, lungs clear without rales or wheezes. Abdomen: Soft without tenderness to palpation or distention. MSK: No new joint effusions or redness or swelling.  Diffuse loss of subcutaneous muscle mass and fat.  Temporal wasting. Neuro: Alert, extraocular movements intact, face symmetric.  Speech fluent.  Strength globally 4/5, symmetric, coordination normal, gait shuffling and unsteady. Psych: Appropriate to questions, psychomotor slowing noted, affect blunted, judgment and insight appear normal.     The results of significant diagnostics from this hospitalization (including imaging, microbiology, ancillary and laboratory) are listed below for reference.     Microbiology: No results found for this or any previous visit (from the past 240 hour(s)).   Labs: BNP (last 3 results) No results for input(s): BNP in the last 8760 hours. Basic Metabolic Panel: Recent Labs  Lab 06/16/18 0730 06/20/18 0609  NA 142 144  K 3.4* 3.7  CL 106 106  CO2 29 28  GLUCOSE 79 77  BUN 17 18  CREATININE 0.59 0.55  CALCIUM 8.6* 9.0   Liver Function Tests: Recent Labs  Lab 06/16/18 0730 06/20/18 0609  AST 155* 85*  ALT 97* 76*  ALKPHOS 232* 162*  BILITOT 0.7 0.6  PROT 7.3 7.8  ALBUMIN 2.8* 2.9*  No results for input(s): LIPASE, AMYLASE in the last 168 hours. No results for input(s): AMMONIA in the last 168 hours. CBC: Recent Labs  Lab 06/16/18 0730 06/20/18 0609  WBC 3.0* 4.1  HGB 9.1* 10.6*  HCT 30.2* 34.7*  MCV 94.1 92.5  PLT 164 223   Cardiac Enzymes: No results for input(s): CKTOTAL, CKMB, CKMBINDEX, TROPONINI in the last 168 hours. BNP: Invalid input(s): POCBNP CBG: No results for input(s): GLUCAP in the last 168 hours. D-Dimer No results for input(s): DDIMER in the  last 72 hours. Hgb A1c No results for input(s): HGBA1C in the last 72 hours. Lipid Profile No results for input(s): CHOL, HDL, LDLCALC, TRIG, CHOLHDL, LDLDIRECT in the last 72 hours. Thyroid function studies No results for input(s): TSH, T4TOTAL, T3FREE, THYROIDAB in the last 72 hours.  Invalid input(s): FREET3 Anemia work up No results for input(s): VITAMINB12, FOLATE, FERRITIN, TIBC, IRON, RETICCTPCT in the last 72 hours. Urinalysis    Component Value Date/Time   COLORURINE YELLOW 05/24/2018 0111   APPEARANCEUR HAZY (A) 05/24/2018 0111   LABSPEC 1.017 05/24/2018 0111   PHURINE 5.0 05/24/2018 0111   GLUCOSEU NEGATIVE 05/24/2018 0111   HGBUR NEGATIVE 05/24/2018 0111   BILIRUBINUR NEGATIVE 05/24/2018 0111   KETONESUR NEGATIVE 05/24/2018 0111   PROTEINUR NEGATIVE 05/24/2018 0111   UROBILINOGEN 0.2 02/27/2014 0501   NITRITE NEGATIVE 05/24/2018 0111   LEUKOCYTESUR SMALL (A) 05/24/2018 0111   Sepsis Labs Invalid input(s): PROCALCITONIN,  WBC,  LACTICIDVEN Microbiology No results found for this or any previous visit (from the past 240 hour(s)).   Time coordinating discharge: 28  SIGNED:   Cristy Folks, MD  [facility] 06/22/2018, 11:42 AM  If 7PM-7AM, please contact night-coverage [email or web address] Password [email or web address]

## 2018-06-22 NOTE — Progress Notes (Signed)
RN called [facility] [phone] and update about [facility]/18 and diagnosis, follow up appointment with [facility] as well to Anitha Curian at 1155. She stated that she got an appointment with [facility] for patient already, no question at this time.

## 2018-06-22 NOTE — NC FL2 (Signed)
[place] MEDICAID FL2 LEVEL OF CARE SCREENING TOOL     IDENTIFICATION  Patient Name: Yvonne [name] Birthdate: 1965/08/15 Sex: female Admission Date (Current Location): 05/30/2018  [facility] and [place] Number:  Producer, television/film/video and Address:  [facility],  [address], [place] [address]      Provider Number: [id number]  Attending Physician Name and Address:  Delaine Lame, MD  Relative Name and Phone Number:       Current Level of Care: Hospital Recommended Level of Care: Skilled Nursing Facility Prior Approval Number:    Date Approved/Denied:   PASRR Number:  [id number]   Discharge Plan: SNF    Current Diagnoses: Patient Active Problem List   Diagnosis Date Noted  . Pressure injury of skin 06/13/2018  . Abnormal LFTs (liver function tests)   . Pancytopenia (HCC)   . Other pancytopenia (HCC) 06/02/2018  . Orthostatic hypotension 05/30/2018  . Severe protein-calorie malnutrition (HCC) 05/30/2018  . Hypocalcemia 05/30/2018  . Transaminitis 05/30/2018  . Fever 05/30/2018  . Thrombocytopenia (HCC) 05/30/2018  . Severe bipolar I disorder, most recent episode depressed (HCC) 06/01/2017  . Bipolar I disorder, current or most recent episode manic, with psychotic features (HCC)   . Affective psychosis, bipolar (HCC) 01/05/2017  . Manic behavior (HCC)     Orientation RESPIRATION BLADDER Height & Weight     Self, Time, Situation, Place  Normal Continent Weight: 102 lb 1.2 oz (46.3 kg) Height:  5\' 2"  (157.5 cm)  BEHAVIORAL SYMPTOMS/MOOD NEUROLOGICAL BOWEL NUTRITION STATUS      Continent Diet(See dc summary)  AMBULATORY STATUS COMMUNICATION OF NEEDS Skin   Limited Assist Verbally Other (Comment)(pressure injury sacrum)                       Personal Care Assistance Level of Assistance  Bathing, Feeding, Dressing Bathing Assistance: Limited assistance Feeding assistance: Independent Dressing Assistance: Limited assistance      Functional Limitations Info  Sight, Hearing, Speech Sight Info: Adequate Hearing Info: Adequate Speech Info: Adequate    SPECIAL CARE FACTORS FREQUENCY  PT (By licensed PT), OT (By licensed OT)     PT Frequency: 5x/week OT Frequency: 5x/week            Contractures Contractures Info: Not present    Additional Factors Info  Allergies, Code Status Code Status Info: Full Allergies Info: Heparin           Current Medications (06/22/2018):  This is the current hospital active medication list Current Facility-Administered Medications  Medication Dose Route Frequency Provider Last Rate Last Dose  . ARIPiprazole (ABILIFY) tablet 15 mg  15 mg Oral Daily Hollice Espy, MD   15 mg at 06/22/18 0904  . feeding supplement (ENSURE ENLIVE) (ENSURE ENLIVE) liquid 237 mL  237 mL Oral BID BM Hollice Espy, MD   237 mL at 06/22/18 0743  . folic acid (FOLVITE) tablet 1 mg  1 mg Oral Daily Marguerita Merles [place], DO   1 mg at 06/22/18 5621  . hydroxychloroquine (PLAQUENIL) tablet 200 mg  200 mg Oral Daily Alberteen Sam, MD   200 mg at 06/22/18 0904  . hydrOXYzine (ATARAX/VISTARIL) tablet 25 mg  25 mg Oral TID PRN Hollice Espy, MD   25 mg at 06/05/18 2252  . ibuprofen (ADVIL,MOTRIN) tablet 400 mg  400 mg Oral Q6H PRN Marguerita Merles [place], DO   400 mg at 06/06/18 1111  . multivitamin with minerals tablet 1 tablet  1 tablet Oral Daily Marguerita MerlesSheikh, Omair D[place]Latif, [place]   1 tablet at 06/22/18 [id number]  . naloxone [facility](NARCAN) injection 0.4 mg  0.4 mg Intravenous PRN Edsel PetrinMikhail, Maryann, DO   0.4 mg at 06/05/18 1225  . ondansetron (ZOFRAN) tablet 4 mg  4 mg Oral Q6H PRN Hollice EspyKrishnan, Sendil K, MD       Or  . ondansetron [facility](ZOFRAN) injection 4 mg  4 mg Intravenous Q6H PRN Hollice EspyKrishnan, Sendil K, MD      . polyethylene glycol (MIRALAX / GLYCOLAX) packet 17 g  17 g Oral Daily PRN Hollice EspyKrishnan, Sendil K, MD      . predniSONE (DELTASONE) tablet 20 mg  20 mg Oral Q breakfast Danford, Earl Liteshristopher P, MD   20 mg at  06/22/18 0742  . thiamine (VITAMIN B-1) tablet 100 mg  100 mg Oral Daily Marguerita MerlesSheikh, Omair [place], DO   100 mg at 06/22/18 [id number]   Or  . thiamine (B-1) injection 100 mg  100 mg Intravenous Daily Marguerita MerlesSheikh, Omair Latif, DO   100 mg at 05/31/18 1240     Discharge Medications: Please see discharge summary for a list of discharge medications.  Relevant Imaging Results:  Relevant Lab Results:   Additional Information ssn: [id number]   Coralyn HellingBernette Stanaland, LCSW

## 2018-06-22 NOTE — Clinical Social Work Placement (Signed)
   11:54 AM   [facility].  LCSW confirmed bed with facility.   Patient will transfer by [facility].   RN report #: [id number]  BKJ  CLINICAL SOCIAL WORK PLACEMENT  NOTE  Date:  06/22/2018  Patient Details  Name: Yvonne [name] MRN: [id number] Date of Birth: Jan 25, 1966  Clinical Social Work is seeking post-discharge placement for this patient at the Skilled  Nursing Facility level of care (*CSW will initial, date and re-position this form in  chart as items are completed):  Yes   Patient/family provided with [facility] Clinical Social Work Department's list of facilities offering this level of care within the geographic area requested by the patient (or if unable, by the patient's family).  Yes   Patient/family informed of their freedom to choose among providers that offer the needed level of care, that participate in Medicare, Medicaid or managed care program needed by the patient, have an available bed and are willing to accept the patient.  Yes   Patient/family informed of 's ownership interest in [facility] and [facility], as well as of the fact that they are under no obligation to receive care at these facilities.  PASRR submitted to EDS on       PASRR number received on 06/20/18     Existing PASRR number confirmed on       FL2 transmitted to all facilities in geographic area requested by pt/family on 06/20/18     FL2 transmitted to all facilities within larger geographic area on       Patient informed that his/her managed care company has contracts with or will negotiate with certain facilities, including the following:        Yes   Patient/family informed of bed offers received.  Patient chooses bed at [facility]([place])     Physician recommends and patient chooses bed at      Patient to be transferred to [facility]([place]) on 06/22/18.  Patient to be transferred to facility by EMS     Patient family notified on  06/22/18 of transfer.  Name of family member notified:  Jerrye BeaversHazel, sister     PHYSICIAN Please sign FL2, Please prepare priority discharge summary, including medications     Additional Comment:    _______________________________________________ Yvonne HellingBernette Moultrie, LCSW 06/22/2018, 11:54 AM

## 2018-06-22 NOTE — Progress Notes (Signed)
Rn [name]  for report at 1210. No question at this time.

## 2018-06-22 NOTE — Discharge Instructions (Signed)

## 2018-06-22 NOTE — Progress Notes (Signed)
Trans[facility] off unit at 2015 [name] Yvonne [name]

## 2018-06-22 NOTE — Care Management Note (Signed)
Case Management Note  Patient Details  Name: Yvonne [name] MRN: [id number] Date of Birth: [birth year]  Subjective/Objective:                  discharged  Action/Plan: [place] [facility] via [facility]  Expected Discharge Date:  06/22/18               Expected Discharge Plan:  Skilled Nursing Facility  In-House Referral:  Clinical Social Work  Discharge planning Services  CM Consult  Post Acute Care Choice:    Choice offered to:     DME Arranged:    DME Agency:     HH Arranged:    HH Agency:     Status of Service:  Completed, signed off  If discussed at [employer] Meetings, dates discussed:    Additional Comments:  Golda AcreDavis, Rhonda Lynn, RN 06/22/2018, 12:26 PM

## 2018-06-22 NOTE — Progress Notes (Addendum)
[facility]. RN called [facility]  And patient's sister for update. Discharge instruction including medication and appointment was in  Discharge package that will be sent with patient by [facility].

## 2018-06-22 NOTE — Progress Notes (Signed)
[facility] called at 4:09.  BKJ

## 2018-07-13 NOTE — Telephone Encounter (Signed)
Patient is scheduled with [facility] for 07/14/18 with Annamarie Major

## 2019-03-09 IMAGING — CT CT BIOPSY
1 of 2 series · 11 of 29 positions shown, 14 images · non-contrast
Comparison: none

INDICATION: 52-year-old with pancytopenia.  Request for bone marrow biopsy.

[Series 2: i-spiral 5.0 b40f · axial · 0.75mm/px · z∈[-171,-101]mm · 11 of 24 slices shown, 14 images]
[im 2/24  mediastinal]
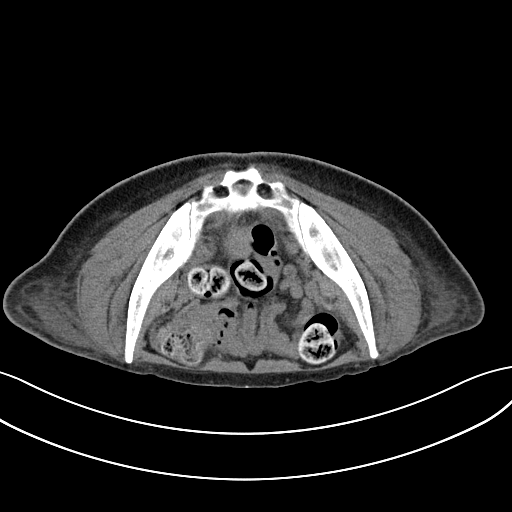
[im 2/24  lung]
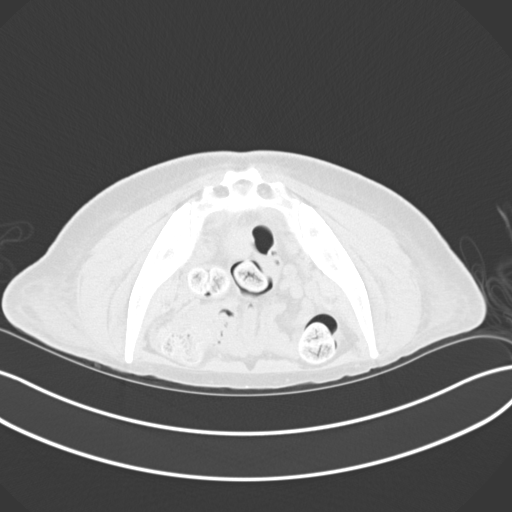
[im 4/24  lung]
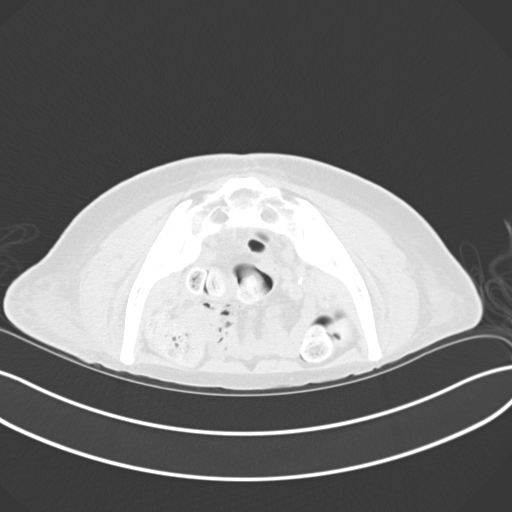
[im 6/24  lung]
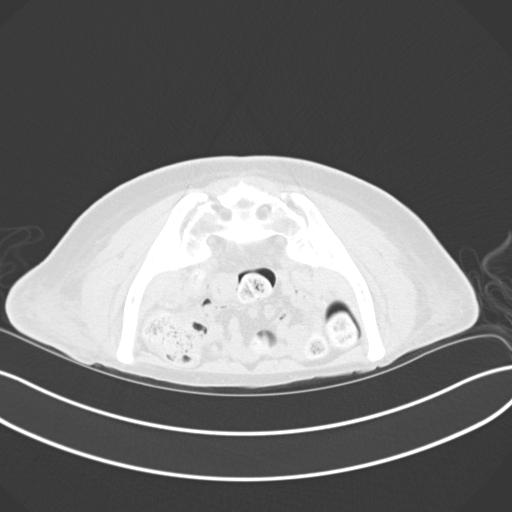
[im 8/24  lung]
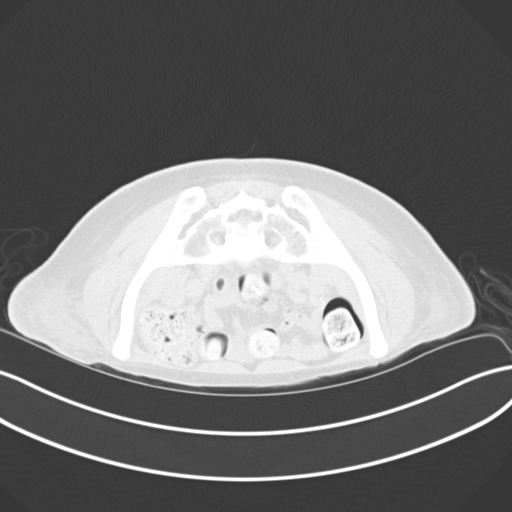
[im 10/24  mediastinal]
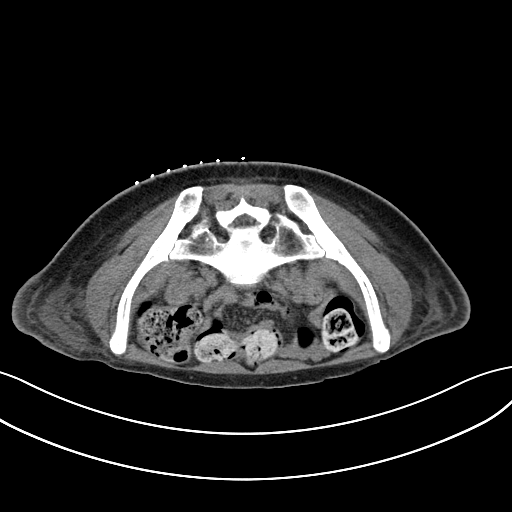
[im 10/24  lung]
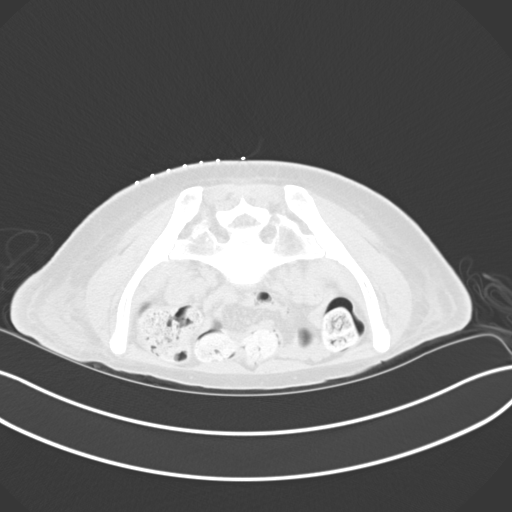
[im 12/24  lung]
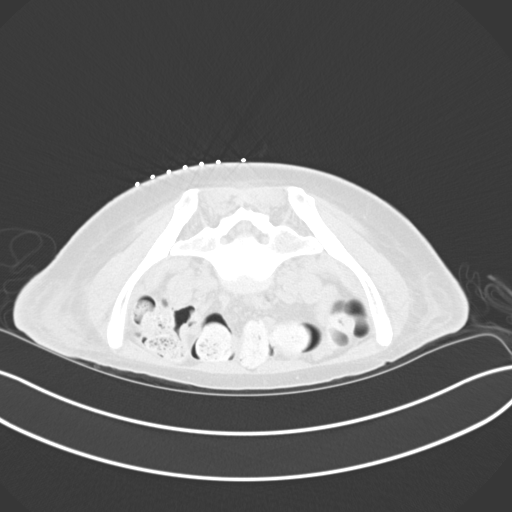
[im 14/24  lung]
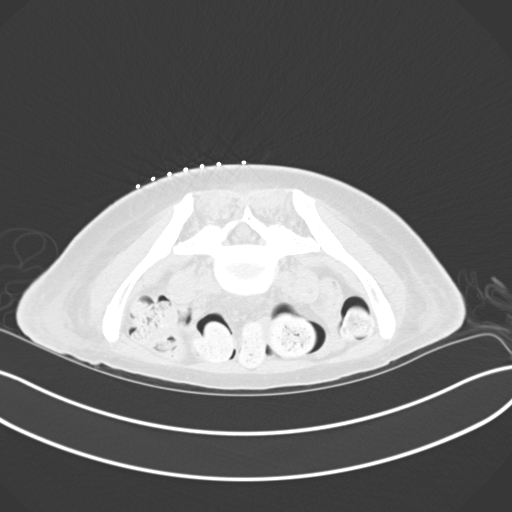
[im 16/24  lung]
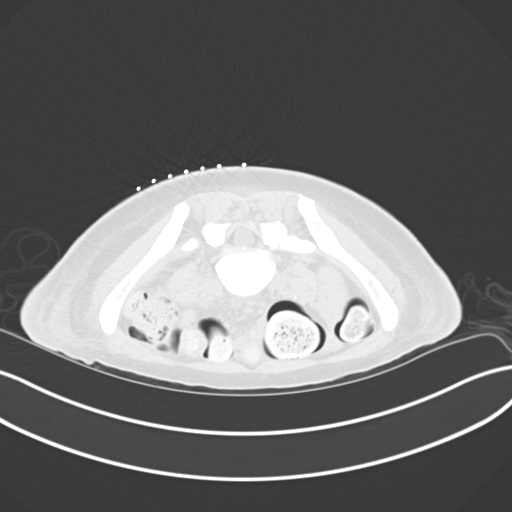
[im 18/24  mediastinal]
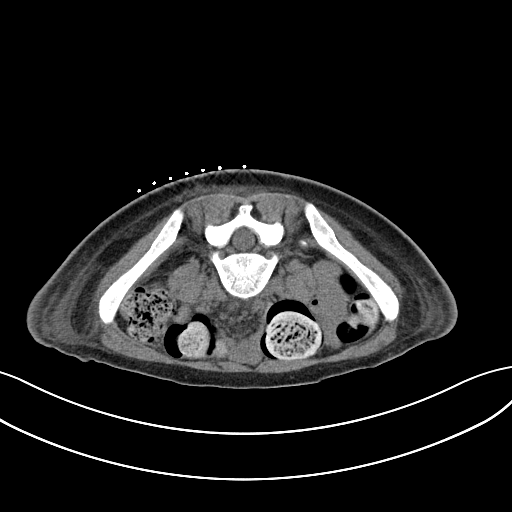
[im 18/24  lung]
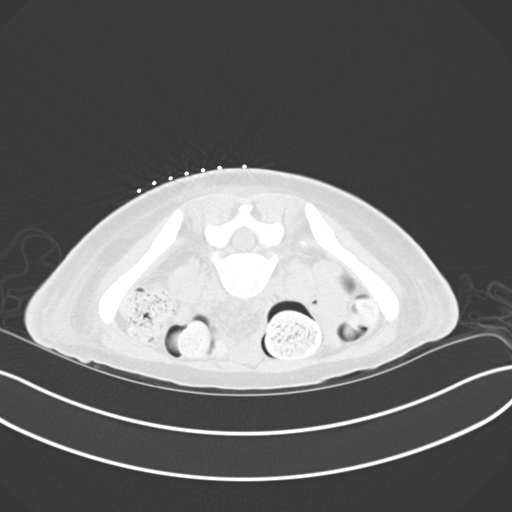
[im 20/24  lung]
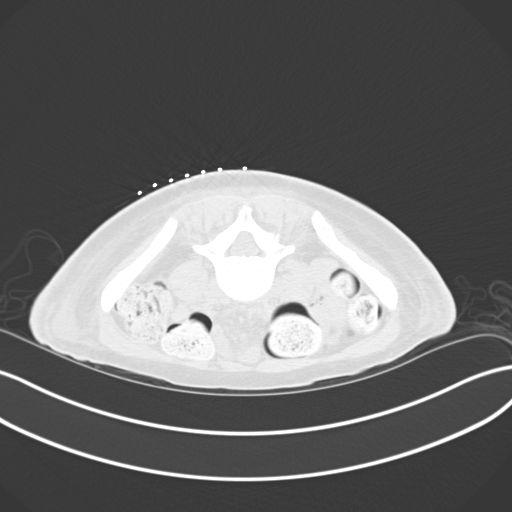
[im 22/24  lung]
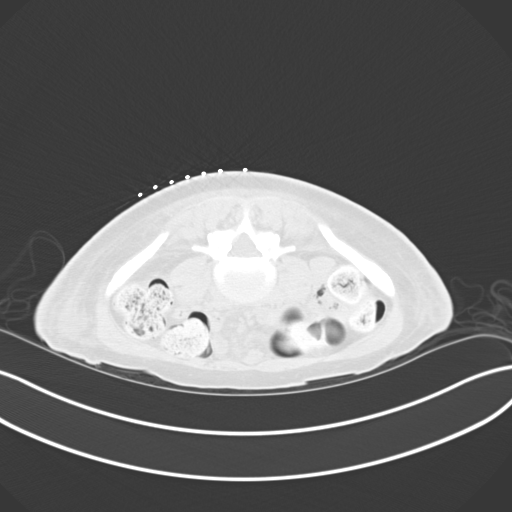

[11 of 29 positions shown; findings below may reference images not displayed]

EXAM:
CT GUIDED BONE MARROW ASPIRATES AND BIOPSY

MEDICATIONS:
None.

ANESTHESIA/SEDATION:
Fentanyl 100 mcg IV; Versed 2.0 mg IV

Moderate Sedation Time:  19 minutes

The patient was continuously monitored during the procedure by the
interventional radiology nurse under my direct supervision.

COMPLICATIONS:
None immediate.

PROCEDURE:
The procedure was explained to the patient. The risks and benefits
of the procedure were discussed and the patient's questions were
addressed. Informed consent was obtained from the patient. The
patient was placed prone on CT table. Images of the pelvis were
obtained. The right side of back was prepped and draped in sterile
fashion. Maximal barrier sterile technique was utilized including
caps, mask, sterile gowns, sterile gloves, sterile drape, hand
hygiene and skin antiseptic. The skin and right posterior ilium were
anesthetized with 1% lidocaine. 11 gauge bone needle was directed
into the right ilium with CT guidance. Two aspirates and two core
biopsies were obtained. Bandage placed over the puncture site.
IMPRESSION: CT guided bone marrow aspiration and core biopsy.

## 2019-03-13 IMAGING — US US BIOPSY CORE LIVER
1 series · 8 of 8 positions shown · non-contrast
Comparison: none

CLINICAL DATA: Pancytopenia, transaminitis

EXAM:
ULTRASOUND-GUIDED CORE LIVER BIOPSY
TECHNIQUE: An ultrasound guided liver biopsy was thoroughly discussed with the
patient and questions were answered. The benefits, risks,
alternatives, and complications were also discussed. The patient
understands and wishes to proceed with the procedure. A verbal as
well as written consent was obtained.

[Series 1: us biopsy core liver · 8 of 8 slices shown]
[im 1/8]
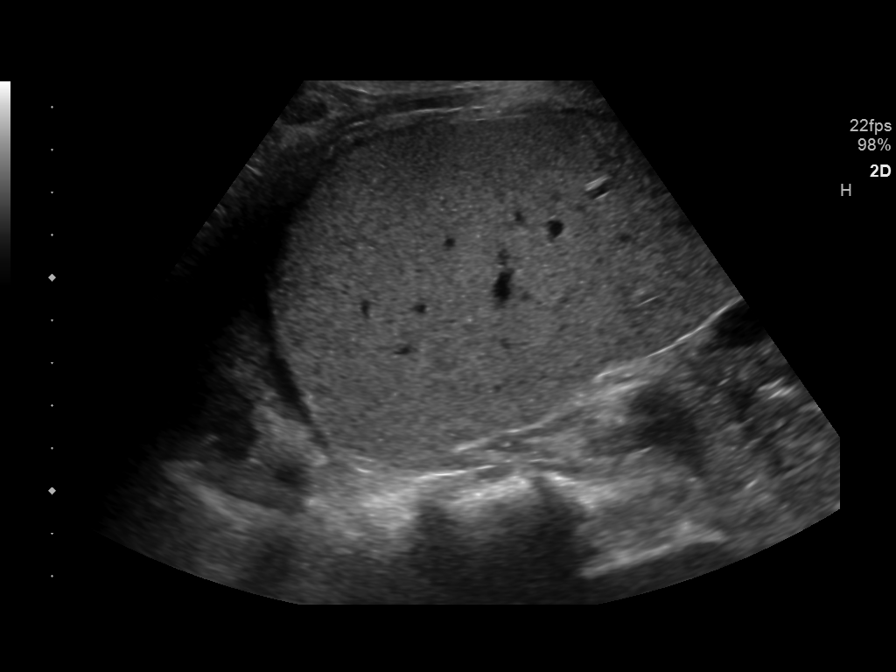
[im 2/8]
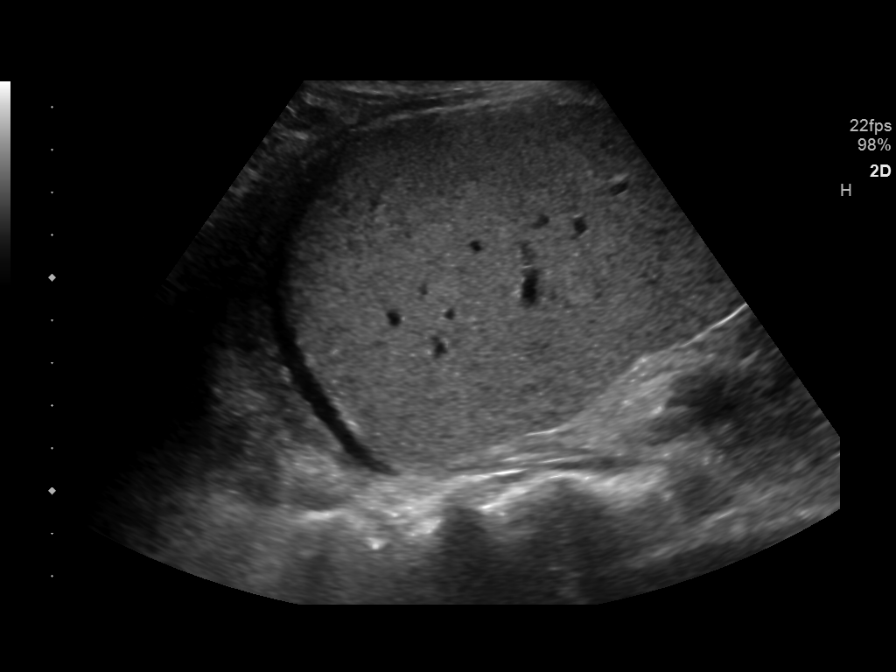
[im 3/8]
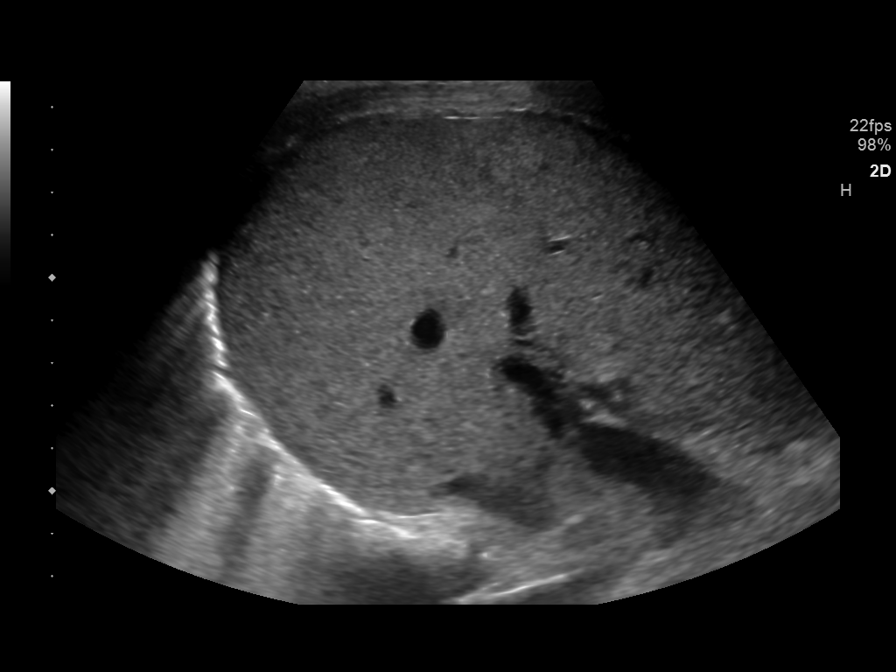
[im 4/8]
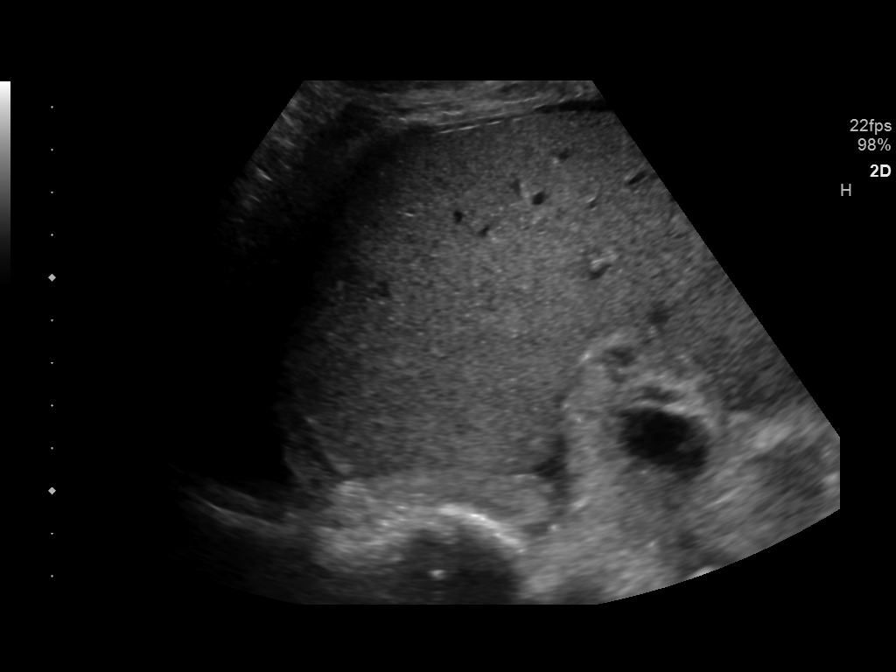
[im 5/8]
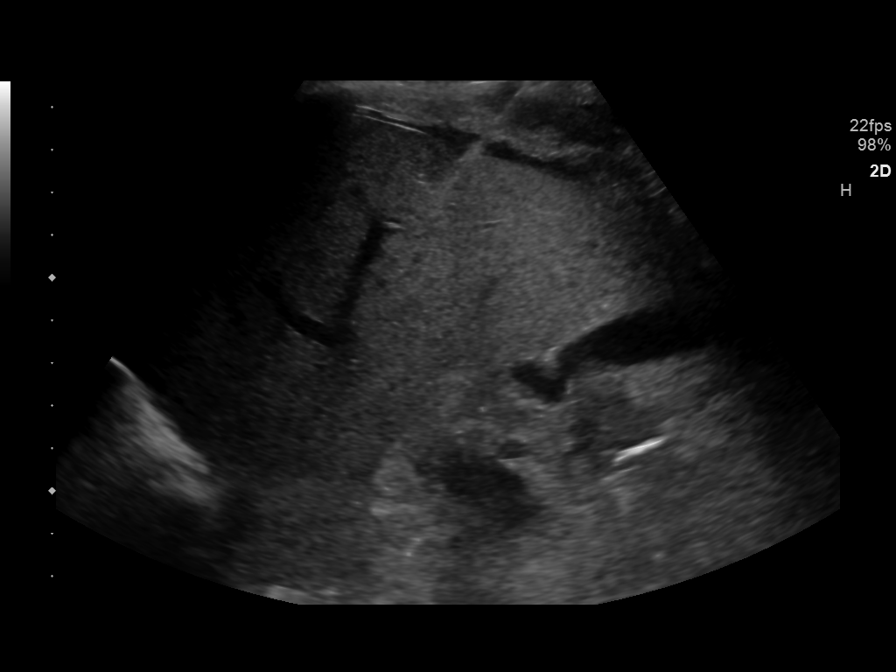
[im 6/8]
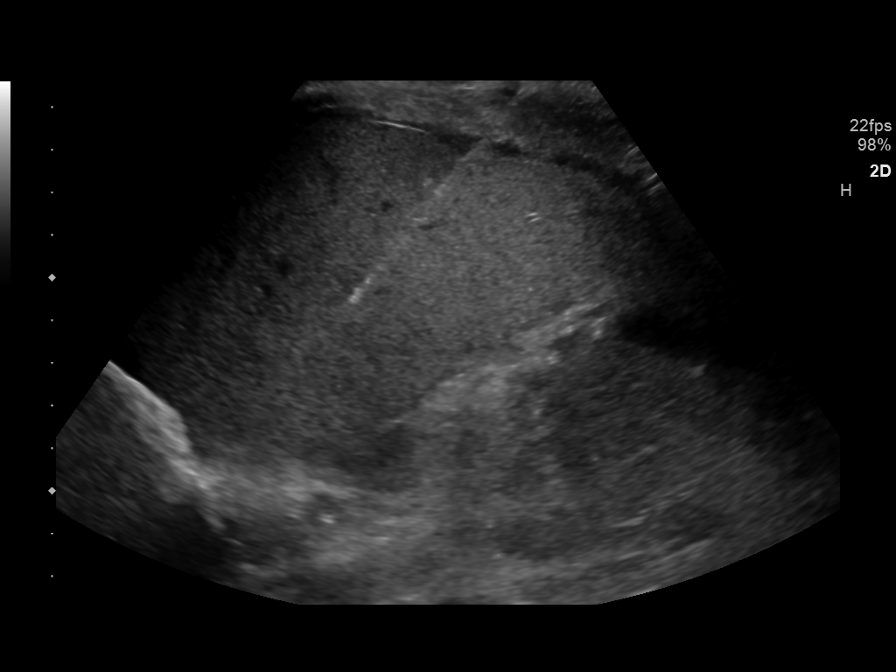
[im 7/8]
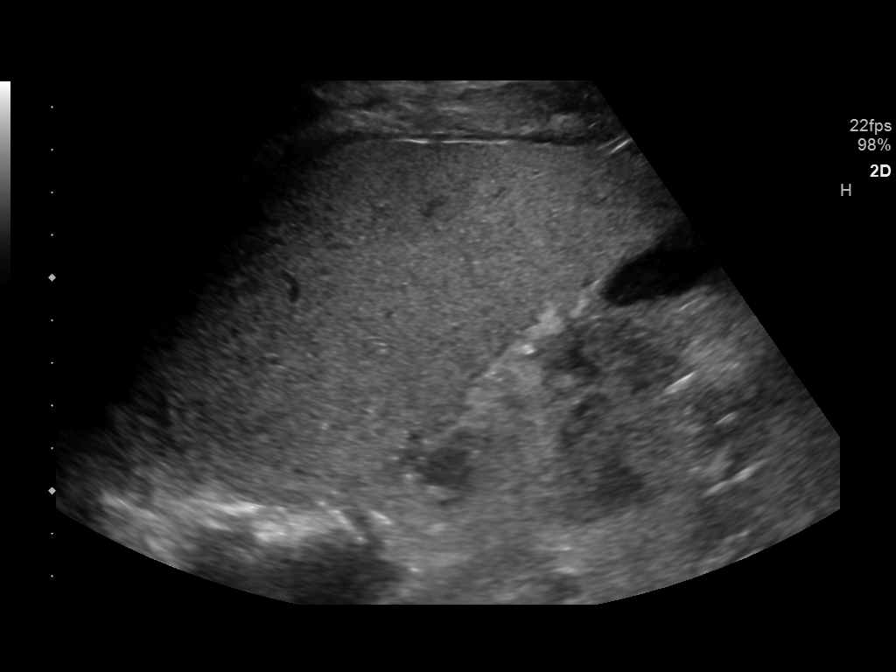
[im 8/8]
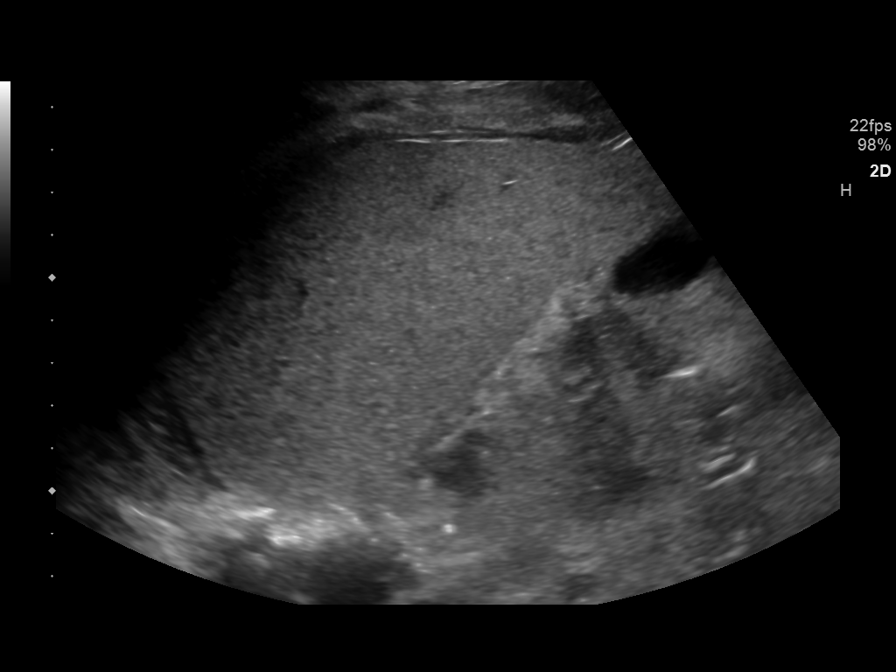

[8 of 8 positions shown; findings below may reference images not displayed]

Survey ultrasound of the liver was performed and an appropriate skin
entry site was determined. Skin site was marked, prepped with
Betadine, and draped in usual sterile fashion, and infiltrated
locally with 1% lidocaine.

Intravenous Fentanyl and Versed were administered as conscious
sedation during continuous monitoring of the patient's level of
consciousness and physiological / cardiorespiratory status by the
radiology RN, with a total moderate sedation time of 10 minutes. A
17 gauge trocar needle was advanced under ultrasound guidance into
the liver. 3 coaxial 59gauge core samples were then obtained through
the guide needle. The guide needle was removed. Post procedure scans
demonstrate no apparent complication.

COMPLICATIONS:
COMPLICATIONS
None immediate
FINDINGS: Survey ultrasound of the liver demonstrates no focal lesion or
biliary ductal dilatation. Representative 18 gauge core biopsy
samples obtained as above.
IMPRESSION: 1. Technically successful ultrasound guided core liver biopsy.

## 2019-08-29 ENCOUNTER — Encounter (HOSPITAL_COMMUNITY): Payer: Self-pay

## 2019-08-29 ENCOUNTER — Emergency Department (HOSPITAL_COMMUNITY): Payer: No Typology Code available for payment source

## 2019-08-29 ENCOUNTER — Emergency Department (HOSPITAL_COMMUNITY)
Admission: EM | Admit: 2019-08-29 | Discharge: 2019-08-29 | Disposition: A | Discharge status: Home / Self Care | Payer: No Typology Code available for payment source | Attending: Emergency Medicine | Admitting: Emergency Medicine

## 2019-08-29 DIAGNOSIS — J40 Bronchitis, not specified as acute or chronic: Secondary | ICD-10-CM | POA: Diagnosis not present

## 2019-08-29 DIAGNOSIS — Z20822 Contact with and (suspected) exposure to covid-19: Secondary | ICD-10-CM | POA: Insufficient documentation

## 2019-08-29 DIAGNOSIS — L949 Localized connective tissue disorder, unspecified: Secondary | ICD-10-CM | POA: Insufficient documentation

## 2019-08-29 DIAGNOSIS — R0602 Shortness of breath: Secondary | ICD-10-CM | POA: Diagnosis not present

## 2019-08-29 DIAGNOSIS — R42 Dizziness and giddiness: Secondary | ICD-10-CM | POA: Insufficient documentation

## 2019-08-29 DIAGNOSIS — R531 Weakness: Secondary | ICD-10-CM | POA: Diagnosis present

## 2019-08-29 LAB — URINALYSIS, ROUTINE W REFLEX MICROSCOPIC
Bilirubin Urine: NEGATIVE
Glucose, UA: NEGATIVE mg/dL
Hgb urine dipstick: NEGATIVE
Ketones, ur: NEGATIVE mg/dL
Leukocytes,Ua: NEGATIVE
Nitrite: NEGATIVE
Protein, ur: NEGATIVE mg/dL
Specific Gravity, Urine: 1.01 (ref 1.005–1.030)
pH: 6 (ref 5.0–8.0)

## 2019-08-29 LAB — CBC WITH DIFFERENTIAL/PLATELET
Abs Immature Granulocytes: 0.08 10*3/uL — ABNORMAL HIGH (ref 0.00–0.07)
Basophils Absolute: 0 10*3/uL (ref 0.0–0.1)
Basophils Relative: 0 %
Eosinophils Absolute: 0 10*3/uL (ref 0.0–0.5)
Eosinophils Relative: 0 %
HCT: 36.4 % (ref 36.0–46.0)
Hemoglobin: 11.4 g/dL — ABNORMAL LOW (ref 12.0–15.0)
Immature Granulocytes: 3 %
Lymphocytes Relative: 19 %
Lymphs Abs: 0.6 10*3/uL — ABNORMAL LOW (ref 0.7–4.0)
MCH: 28.1 pg (ref 26.0–34.0)
MCHC: 31.3 g/dL (ref 30.0–36.0)
MCV: 89.9 fL (ref 80.0–100.0)
Monocytes Absolute: 0.2 10*3/uL (ref 0.1–1.0)
Monocytes Relative: 6 %
Neutro Abs: 2.2 10*3/uL (ref 1.7–7.7)
Neutrophils Relative %: 72 %
Platelets: 184 10*3/uL (ref 150–400)
RBC: 4.05 MIL/uL (ref 3.87–5.11)
RDW: 15.1 % (ref 11.5–15.5)
WBC: 3 10*3/uL — ABNORMAL LOW (ref 4.0–10.5)
nRBC: 1 % — ABNORMAL HIGH (ref 0.0–0.2)

## 2019-08-29 LAB — COMPREHENSIVE METABOLIC PANEL
ALT: 26 U/L (ref 0–44)
AST: 71 U/L — ABNORMAL HIGH (ref 15–41)
Albumin: 3.1 g/dL — ABNORMAL LOW (ref 3.5–5.0)
Alkaline Phosphatase: 56 U/L (ref 38–126)
Anion gap: 10 (ref 5–15)
BUN: 11 mg/dL (ref 6–20)
CO2: 26 mmol/L (ref 22–32)
Calcium: 8.4 mg/dL — ABNORMAL LOW (ref 8.9–10.3)
Chloride: 103 mmol/L (ref 98–111)
Creatinine, Ser: 0.76 mg/dL (ref 0.44–1.00)
GFR calc Af Amer: >60 mL/min (ref >60)
GFR calc non Af Amer: >60 mL/min (ref >60)
Glucose, Bld: 80 mg/dL (ref 70–99)
Potassium: 3.9 mmol/L (ref 3.5–5.1)
Sodium: 139 mmol/L (ref 135–145)
Total Bilirubin: 0.7 mg/dL (ref 0.3–1.2)
Total Protein: 7.6 g/dL (ref 6.5–8.1)

## 2019-08-29 LAB — TROPONIN I (HIGH SENSITIVITY)
Troponin I (High Sensitivity): 3 ng/L (ref <18)
Troponin I (High Sensitivity): 3 ng/L (ref <18)

## 2019-08-29 LAB — LACTIC ACID, PLASMA
Lactic Acid, Venous: 1.1 mmol/L (ref 0.5–1.9)
Lactic Acid, Venous: 0.9 mmol/L (ref 0.5–1.9)

## 2019-08-29 LAB — D-DIMER, QUANTITATIVE: D-Dimer, Quant: 2.5 ug/mL-FEU — ABNORMAL HIGH (ref 0.00–0.50)

## 2019-08-29 LAB — SARS CORONAVIRUS 2 (TAT 6-24 HRS): SARS Coronavirus 2: NEGATIVE

## 2019-08-29 MED ORDER — ALBUTEROL SULFATE HFA 108 (90 BASE) MCG/ACT IN AERS
2.0000 | INHALATION_SPRAY | Freq: Once | RESPIRATORY_TRACT | Status: AC
  Administered 2019-08-29: 2 via RESPIRATORY_TRACT
  Filled 2019-08-29: qty 6.7

## 2019-08-29 MED ORDER — ACETAMINOPHEN 325 MG PO TABS
650.0000 mg | ORAL_TABLET | Freq: Once | ORAL | Status: AC
  Administered 2019-08-29: 650 mg via ORAL
  Filled 2019-08-29: qty 2

## 2019-08-29 MED ORDER — SODIUM CHLORIDE 0.9 % IV BOLUS
1000.0000 mL | Freq: Once | INTRAVENOUS | Status: AC
  Administered 2019-08-29: 1000 mL via INTRAVENOUS

## 2019-08-29 MED ORDER — METHYLPREDNISOLONE SODIUM SUCC 125 MG IJ SOLR
125.0000 mg | Freq: Once | INTRAMUSCULAR | Status: AC
  Administered 2019-08-29: 125 mg via INTRAVENOUS
  Filled 2019-08-29: qty 2

## 2019-08-29 MED ORDER — IOHEXOL 350 MG/ML SOLN
100.0000 mL | Freq: Once | INTRAVENOUS | Status: AC | PRN
  Administered 2019-08-29: 15:00:00 100 mL via INTRAVENOUS

## 2019-08-29 MED ORDER — SODIUM CHLORIDE (PF) 0.9 % IJ SOLN
INTRAMUSCULAR | Status: AC
  Filled 2019-08-29: qty 50

## 2019-08-29 MED ORDER — PREDNISONE 20 MG PO TABS: ORAL_TABLET | ORAL | 0 refills | Status: DC

## 2019-08-29 NOTE — ED Notes (Deleted)
RN put patient on 2L [place] because SpO2 was 89%. Patient now 98% SpO2.  

## 2019-08-29 NOTE — ED Notes (Signed)
[facility] called at this time for transport 

## 2019-08-29 NOTE — ED Triage Notes (Signed)
Patient came from [facility]  C/o generalized weakness, lack of appetite, dizziness, SOB x3 days  Neg orthostatics with EMS   118/70 97% RA 110 HR 95 CBG 97.7

## 2019-08-29 NOTE — Discharge Instructions (Addendum)
Take prednisone as prescribed.   Use albuterol every 4 hrs for cough and wheezing   You have lymph nodes in your chest that needs follow up.   See your doctor   Return to ER if you have worse shortness of breath, fever, cough.      Person Under Monitoring Name: Yvonne [name]  Location: [address] [place] [address]   Infection Prevention Recommendations for Individuals Confirmed to have, or Being Evaluated for, 2019 Novel Coronavirus (COVID-19) Infection Who Receive Care at Home  Individuals who are confirmed to have, or are being evaluated for, COVID-19 should follow the prevention steps below until a healthcare provider or local or state health department says they can return to normal activities.  Stay home except to get medical care You should restrict activities outside your home, except for getting medical care. Do not go to work, school, or public areas, and do not use public transportation or taxis.  Call ahead before visiting your doctor Before your medical appointment, call the healthcare provider and tell them that you have, or are being evaluated for, COVID-19 infection. This will help the healthcare providers office take steps to keep other people from getting infected. Ask your healthcare provider to call the local or state health department.  Monitor your symptoms Seek prompt medical attention if your illness is worsening (e.g., difficulty breathing). Before going to your medical appointment, call the healthcare provider and tell them that you have, or are being evaluated for, COVID-19 infection. Ask your healthcare provider to call the local or state health department.  Wear a facemask You should wear a facemask that covers your nose and mouth when you are in the same room with other people and when you visit a healthcare provider. People who live with or visit you should also wear a facemask while they are in the same room with you.  Separate  yourself from other people in your home As much as possible, you should stay in a different room from other people in your home. Also, you should use a separate bathroom, if available.  Avoid sharing household items You should not share dishes, drinking glasses, cups, eating utensils, towels, bedding, or other items with other people in your home. After using these items, you should wash them thoroughly with soap and water.  Cover your coughs and sneezes Cover your mouth and nose with a tissue when you cough or sneeze, or you can cough or sneeze into your sleeve. Throw used tissues in a lined trash can, and immediately wash your hands with soap and water for at least 20 seconds or use an alcohol-based hand rub.  Wash your [facility] your hands often and thoroughly with soap and water for at least 20 seconds. You can use an alcohol-based hand sanitizer if soap and water are not available and if your hands are not visibly dirty. Avoid touching your eyes, nose, and mouth with unwashed hands.   Prevention Steps for Caregivers and Household Members of Individuals Confirmed to have, or Being Evaluated for, COVID-19 Infection Being Cared for in the Home  If you live with, or provide care at home for, a person confirmed to have, or being evaluated for, COVID-19 infection please follow these guidelines to prevent infection:  Follow healthcare providers instructions Make sure that you understand and can help the patient follow any healthcare provider instructions for all care.  Provide for the patients basic needs You should help the patient with basic needs in the  home and provide support for getting groceries, prescriptions, and other personal needs.  Monitor the patients symptoms If they are getting sicker, call his or her medical provider and tell them that the patient has, or is being evaluated for, COVID-19 infection. This will help the healthcare providers office take steps to keep  other people from getting infected. Ask the healthcare provider to call the local or state health department.  Limit the number of people who have contact with the patient If possible, have only one caregiver for the patient. Other household members should stay in another home or place of residence. If this is not possible, they should stay in another room, or be separated from the patient as much as possible. Use a separate bathroom, if available. Restrict visitors who do not have an essential need to be in the home.  Keep older adults, very young children, and other sick people away from the patient Keep older adults, very young children, and those who have compromised immune systems or chronic health conditions away from the patient. This includes people with chronic heart, lung, or kidney conditions, diabetes, and cancer.  Ensure good ventilation Make sure that shared spaces in the home have good air flow, such as from an air conditioner or an opened window, weather permitting.  Wash your hands often Wash your hands often and thoroughly with soap and water for at least 20 seconds. You can use an alcohol based hand sanitizer if soap and water are not available and if your hands are not visibly dirty. Avoid touching your eyes, nose, and mouth with unwashed hands. Use disposable paper towels to dry your hands. If not available, use dedicated cloth towels and replace them when they become wet.  Wear a facemask and gloves Wear a disposable facemask at all times in the room and gloves when you touch or have contact with the patients blood, body fluids, and/or secretions or excretions, such as sweat, saliva, sputum, nasal mucus, vomit, urine, or feces.  Ensure the mask fits over your nose and mouth tightly, and do not touch it during use. Throw out disposable facemasks and gloves after using them. Do not reuse. Wash your hands immediately after removing your facemask and gloves. If your  personal clothing becomes contaminated, carefully remove clothing and launder. Wash your hands after handling contaminated clothing. Place all used disposable facemasks, gloves, and other waste in a lined container before disposing them with other household waste. Remove gloves and wash your hands immediately after handling these items.  Do not share dishes, glasses, or other household items with the patient Avoid sharing household items. You should not share dishes, drinking glasses, cups, eating utensils, towels, bedding, or other items with a patient who is confirmed to have, or being evaluated for, COVID-19 infection. After the person uses these items, you should wash them thoroughly with soap and water.  Wash laundry thoroughly Immediately remove and wash clothes or bedding that have blood, body fluids, and/or secretions or excretions, such as sweat, saliva, sputum, nasal mucus, vomit, urine, or feces, on them. Wear gloves when handling laundry from the patient. Read and follow directions on labels of laundry or clothing items and detergent. In general, wash and dry with the warmest temperatures recommended on the label.  Clean all areas the individual has used often Clean all touchable surfaces, such as counters, tabletops, doorknobs, bathroom fixtures, toilets, phones, keyboards, tablets, and bedside tables, every day. Also, clean any surfaces that may have blood, body fluids, and/or  secretions or excretions on them. Wear gloves when cleaning surfaces the patient has come in contact with. Use a diluted bleach solution (e.g., dilute bleach with 1 part bleach and 10 parts water) or a household disinfectant with a label that says EPA-registered for coronaviruses. To make a bleach solution at home, add 1 tablespoon of bleach to 1 quart (4 cups) of water. For a larger supply, add  cup of bleach to 1 gallon (16 cups) of water. Read labels of cleaning products and follow recommendations provided on  product labels. Labels contain instructions for safe and effective use of the cleaning product including precautions you should take when applying the product, such as wearing gloves or eye protection and making sure you have good ventilation during use of the product. Remove gloves and wash hands immediately after cleaning.  Monitor yourself for signs and symptoms of illness Caregivers and household members are considered close contacts, should monitor their health, and will be asked to limit movement outside of the home to the extent possible. Follow the monitoring steps for close contacts listed on the symptom monitoring form.   ? If you have additional questions, contact your local health department or call the epidemiologist on call at [phone] (available 24/7). ? This guidance is subject to change. For the most up-to-date guidance from [facility], please refer to their website: [email or web address].hu

## 2019-08-29 NOTE — ED Notes (Signed)
RN checked purewick to see if there was any urine and there wasn't. RN informed patient that a urine sample was needed.

## 2019-08-29 NOTE — ED Provider Notes (Signed)
[facility] DEPT Provider Note   CSN: [id number] Arrival date & time: 08/29/19  1043     History Chief Complaint  Patient presents with  . Weakness    Yvonne [name] is a 54 y.o. female hx of bipolar, connective tissue disease on Plaquenil here presenting with weakness, shortness of breath.  Patient has been feeling weak and has no appetite and subjective shortness of breath for several days.  Patient states that she just cannot catch her breath. She states that she is feeling like she is on pass out as well.  Patient recently saw her rheumatologist at [facility] clinic for her connective tissue disease.  She was started on Plaquenil about a week ago.   The history is provided by the patient.       Past Medical History:  Diagnosis Date  . Bipolar 1 disorder ([place])   . Medical history non-contributory   . Mental disorder   . No pertinent past medical history     Patient Active Problem List   Diagnosis Date Noted  . Pressure injury of skin 06/13/2018  . Abnormal LFTs (liver function tests)   . Pancytopenia ([place])   . Other pancytopenia ([place]) 06/02/2018  . Orthostatic hypotension 05/30/2018  . Severe protein-calorie malnutrition ([place]) 05/30/2018  . Hypocalcemia 05/30/2018  . Transaminitis 05/30/2018  . Fever 05/30/2018  . Thrombocytopenia ([place]) 05/30/2018  . Severe bipolar I disorder, most recent episode depressed ([place]) 06/01/2017  . Bipolar I disorder, current or most recent episode manic, with psychotic features ([place])   . Affective psychosis, bipolar ([place]) 01/05/2017  . Manic behavior ([place])     Past Surgical History:  Procedure Laterality Date  . NO PAST SURGERIES       OB History   No obstetric history on file.     Family History  Problem Relation Age of Onset  . Depression Sister   . Depression Brother   . Depression Sister   . CAD Mother   . Hypertension Father   . Diabetes Father   . Bipolar disorder Cousin     Social  History   Tobacco Use  . Smoking status: Never Smoker  . Smokeless tobacco: Never Used  Substance Use Topics  . Alcohol use: No  . Drug use: No    Home Medications Prior to Admission medications   Medication Sig Start Date End Date Taking? Authorizing Provider  ARIPiprazole (ABILIFY) 15 MG tablet Take 1 tablet (15 mg total) by mouth daily. For mood control 06/23/17  Yes Lindell Spar I, NP  benztropine (COGENTIN) 0.5 MG tablet Take 1 tablet (0.5 mg total) by mouth daily. For prevention of drug induced tremors 06/23/17  Yes Lindell Spar I, NP  hydrOXYzine (ATARAX/VISTARIL) 25 MG tablet Take 25 mg by mouth 3 (three) times daily as needed for anxiety.   Yes [provider]  Vitamin D, Ergocalciferol, (DRISDOL) 1.25 MG (50000 UT) CAPS capsule Take 50,000 Units by mouth every 14 (fourteen) days.   Yes [provider]  hydroxychloroquine (PLAQUENIL) 200 MG tablet Take 1 tablet (200 mg total) by mouth daily. Patient not taking: Reported on 08/29/2019 06/23/18   Purohit, Konrad Dolores, MD  predniSONE (DELTASONE) 20 MG tablet Take 60 mg daily x 2 days then 40 mg daily x 2 days then 20 mg daily x 2 days 08/29/19   Drenda Freeze, MD    Allergies    Heparin  Review of Systems   Review of Systems  Respiratory: Positive for shortness  of breath.   Neurological: Positive for weakness.  All other systems reviewed and are negative.   Physical Exam Updated Vital Signs BP 101/68   Pulse 98   Temp 98.2 F (36.8 C) (Oral)   Resp 18   SpO2 98%   Physical Exam Vitals and nursing note reviewed.  Constitutional:      Comments: Tired   HENT:     Head: Normocephalic.     Nose: Nose normal.     Mouth/Throat:     Mouth: Mucous membranes are dry.  Eyes:     Extraocular Movements: Extraocular movements intact.     Pupils: Pupils are equal, round, and reactive to light.  Cardiovascular:     Rate and Rhythm: Normal rate and regular rhythm.     Pulses: Normal pulses.     Heart  sounds: Normal heart sounds.  Pulmonary:     Comments: Diminished bilaterally  Abdominal:     General: Abdomen is flat.     Palpations: Abdomen is soft.  Musculoskeletal:        General: Normal range of motion.     Cervical back: Normal range of motion.  Skin:    General: Skin is warm.     Capillary Refill: Capillary refill takes less than 2 seconds.  Neurological:     General: No focal deficit present.     Mental Status: She is oriented to person, place, and time.  Psychiatric:        Mood and Affect: Mood normal.        Behavior: Behavior normal.     ED Results / Procedures / Treatments   Labs (all labs ordered are listed, but only abnormal results are displayed) Labs Reviewed  CBC WITH DIFFERENTIAL/PLATELET - Abnormal; Notable for the following components:      Result Value   WBC 3.0 (*)    Hemoglobin 11.4 (*)    nRBC 1.0 (*)    Lymphs Abs 0.6 (*)    Abs Immature Granulocytes 0.08 (*)    All other components within normal limits  COMPREHENSIVE METABOLIC PANEL - Abnormal; Notable for the following components:   Calcium 8.4 (*)    Albumin 3.1 (*)    AST 71 (*)    All other components within normal limits  D-DIMER, QUANTITATIVE (NOT AT [facility]) - Abnormal; Notable for the following components:   D-Dimer, Quant 2.50 (*)    All other components within normal limits  CULTURE, BLOOD (ROUTINE X 2)  CULTURE, BLOOD (ROUTINE X 2)  URINE CULTURE  SARS CORONAVIRUS 2 (TAT 6-24 HRS)  LACTIC ACID, PLASMA  LACTIC ACID, PLASMA  URINALYSIS, ROUTINE W REFLEX MICROSCOPIC  TROPONIN I (HIGH SENSITIVITY)  TROPONIN I (HIGH SENSITIVITY)    EKG EKG Interpretation  Date/Time:  Wednesday August 29 2019 10:59:05 EST Ventricular Rate:  113 PR Interval:    QRS Duration: 86 QT Interval:  343 QTC Calculation: 471 R Axis:   6 Text Interpretation: Sinus tachycardia Ventricular premature complex Low voltage, precordial leads Minimal ST depression, inferior leads No significant change since  last tracing Confirmed by Wandra Arthurs [phone]) on 08/29/2019 1:02:18 PM   Radiology CT Angio Chest PE W and/or Wo Contrast  Result Date: 08/29/2019 CLINICAL DATA:  Shortness of breath rule out PE. EXAM: CT ANGIOGRAPHY CHEST WITH CONTRAST TECHNIQUE: Multidetector CT imaging of the chest was performed using the standard protocol during bolus administration of intravenous contrast. Multiplanar CT image reconstructions and MIPs were obtained to evaluate the vascular anatomy.  CONTRAST:  11m OMNIPAQUE IOHEXOL 350 MG/ML SOLN COMPARISON:  CT abdomen and pelvis from 05/30/2018. MRI abdomen also from 2019. FINDINGS: Cardiovascular: Aorta is normal caliber not well assessed due to bolus timing with scattered atherosclerosis. Heart size normal without pericardial effusion. Limited assessment of pulmonary vasculature in the lung bases due to respiratory motion. No central or lobar embolus. No signs of upper lobe embolus. Mediastinum/Nodes: Scattered mediastinal lymph nodes none displaying pathologic enlargement. No hilar lymphadenopathy. Right axillary lymph node with mild enlargement approximately 1 cm. Numerous small thoracic inlet lymph nodes. Numerous left axillary lymph nodes as well Lungs/Pleura: Basilar atelectasis. Mild volume loss in the right middle lobe. No signs of pleural effusion or dense consolidation. Limited assessment of parenchyma due to respiratory motion. Airways are patent. Upper Abdomen: No acute findings in the upper abdomen. Musculoskeletal: No chest wall abnormality. No acute or significant osseous findings. Review of the MIP images confirms the above findings. IMPRESSION: 1. Limited assessment of pulmonary vasculature in the lung bases due to respiratory motion. No central or lobar pulmonary embolus identified. 2. Mild volume loss in the right middle lobe. 3. Numerous small thoracic inlet and bilateral axillary lymph nodes. Mildly enlarged right axillary lymph node. Findings are nonspecific and  could be reactive. Given number and borderline size consider clinical and or ultrasound follow-up of these areas. These results were called by telephone at the time of interpretation on 08/29/2019 at 4:28 pm to provider Criston Chancellor , who verbally acknowledged these results. Aortic Atherosclerosis (ICD10-I70.0). Electronically Signed   By: GZetta BillsM.D.   On: 08/29/2019 16:43   DG Chest Port 1 View  Result Date: 08/29/2019 CLINICAL DATA:  Cough and shortness of breath EXAM: PORTABLE CHEST 1 VIEW COMPARISON:  May 31, 2018 FINDINGS: There is bibasilar atelectasis. Lungs elsewhere are clear. Heart size and pulmonary vascularity are normal. No adenopathy. No bone lesions. IMPRESSION: Bibasilar atelectasis. Lungs elsewhere clear. Cardiac silhouette within normal limits. Electronically Signed   By: WLowella GripIII M.D.   On: 08/29/2019 11:22    Procedures Procedures (including critical care time)  Medications Ordered in ED Medications  methylPREDNISolone sodium succinate (SOLU-MEDROL) 125 mg/2 mL injection 125 mg (has no administration in time range)  albuterol (VENTOLIN HFA) 108 (90 Base) MCG/ACT inhaler 2 puff (has no administration in time range)  sodium chloride 0.9 % bolus 1,000 mL (0 mLs Intravenous Stopped 08/29/19 1304)  acetaminophen (TYLENOL) tablet 650 mg (650 mg Oral Given 08/29/19 1138)  iohexol (OMNIPAQUE) 350 MG/ML injection 100 mL (100 mLs Intravenous Contrast Given 08/29/19 1526)  sodium chloride (PF) 0.9 % injection (  Given by Other 08/29/19 1621)    ED Course  I have reviewed the triage vital signs and the nursing notes.  Pertinent labs & imaging results that were available during my care of the patient were reviewed by me and considered in my medical decision making (see chart for details).    MDM Rules/Calculators/A&P                      MLeeann Badyis a 54y.o. female who presented with shortness of breath.  She has no known Covid exposure.  Patient has  low-grade temperature and borderline blood pressure.  Consider pneumonia versus Covid versus PE. Will get labs and D-dimer and chest x-ray and Covid test.   4:48 PM D-dimer elevated. COVID pending.  There is no obvious large PE.  Patient has multiple lymph nodes.  She actually saw rheumatology  and had bone marrow biopsy before.  She is on Plaquenil currently.  She is hemodynamically stable and is not hypoxic .  We will give a course of steroids and albuterol.  She can follow-up outpatient for her lymphadenopathy.  Told her to stay home until Covid test comes back.  She states that she is home all the time anyway and has no Covid exposure.  Kenidee Cregan was evaluated in Emergency Department on 08/29/2019 for the symptoms described in the history of present illness. She was evaluated in the context of the global COVID-19 pandemic, which necessitated consideration that the patient might be at risk for infection with the SARS-CoV-2 virus that causes COVID-19. Institutional protocols and algorithms that pertain to the evaluation of patients at risk for COVID-19 are in a state of rapid change based on information released by regulatory bodies including the CDC and federal and state organizations. These policies and algorithms were followed during the patient's care in the ED.  Final Clinical Impression(s) / ED Diagnoses Final diagnoses:  Bronchitis  COVID-19 virus test result unknown    Rx / DC Orders ED Discharge Orders         Ordered    predniSONE (DELTASONE) 20 MG tablet     08/29/19 1640           Drenda Freeze, MD 08/29/19 [phone]

## 2019-08-29 NOTE — ED Notes (Signed)
Pure wick has been placed. Suction set to 45mmHg.  

## 2019-08-30 LAB — URINE CULTURE: Culture: NO GROWTH

## 2019-09-03 LAB — CULTURE, BLOOD (ROUTINE X 2)
Culture: NO GROWTH
Culture: NO GROWTH

## 2019-10-21 ENCOUNTER — Inpatient Hospital Stay (HOSPITAL_COMMUNITY)
Admission: EM | Admit: 2019-10-21 | Discharge: 2019-10-26 | DRG: 556 | Disposition: A | Discharge status: Other Acute Inpatient Hospital | Payer: No Typology Code available for payment source | Attending: Internal Medicine | Admitting: Internal Medicine

## 2019-10-21 ENCOUNTER — Other Ambulatory Visit: Payer: Self-pay

## 2019-10-21 ENCOUNTER — Encounter (HOSPITAL_COMMUNITY): Payer: Self-pay | Admitting: Emergency Medicine

## 2019-10-21 DIAGNOSIS — M351 Other overlap syndromes: Secondary | ICD-10-CM | POA: Diagnosis present

## 2019-10-21 DIAGNOSIS — R531 Weakness: Secondary | ICD-10-CM

## 2019-10-21 DIAGNOSIS — M60851 Other myositis, right thigh: Secondary | ICD-10-CM | POA: Diagnosis present

## 2019-10-21 DIAGNOSIS — E876 Hypokalemia: Secondary | ICD-10-CM | POA: Diagnosis not present

## 2019-10-21 DIAGNOSIS — Z818 Family history of other mental and behavioral disorders: Secondary | ICD-10-CM

## 2019-10-21 DIAGNOSIS — M60852 Other myositis, left thigh: Secondary | ICD-10-CM | POA: Diagnosis not present

## 2019-10-21 DIAGNOSIS — M791 Myalgia, unspecified site: Secondary | ICD-10-CM

## 2019-10-21 DIAGNOSIS — Z833 Family history of diabetes mellitus: Secondary | ICD-10-CM

## 2019-10-21 DIAGNOSIS — R269 Unspecified abnormalities of gait and mobility: Secondary | ICD-10-CM

## 2019-10-21 DIAGNOSIS — Z20822 Contact with and (suspected) exposure to covid-19: Secondary | ICD-10-CM | POA: Diagnosis present

## 2019-10-21 DIAGNOSIS — S76111A Strain of right quadriceps muscle, fascia and tendon, initial encounter: Secondary | ICD-10-CM | POA: Diagnosis present

## 2019-10-21 DIAGNOSIS — W19XXXA Unspecified fall, initial encounter: Secondary | ICD-10-CM | POA: Diagnosis present

## 2019-10-21 DIAGNOSIS — K754 Autoimmune hepatitis: Secondary | ICD-10-CM | POA: Diagnosis present

## 2019-10-21 DIAGNOSIS — Z888 Allergy status to other drugs, medicaments and biological substances status: Secondary | ICD-10-CM

## 2019-10-21 DIAGNOSIS — D649 Anemia, unspecified: Secondary | ICD-10-CM | POA: Diagnosis not present

## 2019-10-21 DIAGNOSIS — M5127 Other intervertebral disc displacement, lumbosacral region: Secondary | ICD-10-CM | POA: Diagnosis present

## 2019-10-21 DIAGNOSIS — Z79899 Other long term (current) drug therapy: Secondary | ICD-10-CM

## 2019-10-21 DIAGNOSIS — Z8249 Family history of ischemic heart disease and other diseases of the circulatory system: Secondary | ICD-10-CM

## 2019-10-21 DIAGNOSIS — F319 Bipolar disorder, unspecified: Secondary | ICD-10-CM | POA: Diagnosis present

## 2019-10-21 LAB — COMPREHENSIVE METABOLIC PANEL
ALT: 54 U/L — ABNORMAL HIGH (ref 0–44)
AST: 148 U/L — ABNORMAL HIGH (ref 15–41)
Albumin: 2.9 g/dL — ABNORMAL LOW (ref 3.5–5.0)
Alkaline Phosphatase: 64 U/L (ref 38–126)
Anion gap: 8 (ref 5–15)
BUN: 15 mg/dL (ref 6–20)
CO2: 25 mmol/L (ref 22–32)
Calcium: 8.2 mg/dL — ABNORMAL LOW (ref 8.9–10.3)
Chloride: 106 mmol/L (ref 98–111)
Creatinine, Ser: 0.6 mg/dL (ref 0.44–1.00)
GFR calc Af Amer: >60 mL/min (ref >60)
GFR calc non Af Amer: >60 mL/min (ref >60)
Glucose, Bld: 105 mg/dL — ABNORMAL HIGH (ref 70–99)
Potassium: 3.6 mmol/L (ref 3.5–5.1)
Sodium: 139 mmol/L (ref 135–145)
Total Bilirubin: 0.9 mg/dL (ref 0.3–1.2)
Total Protein: 6.9 g/dL (ref 6.5–8.1)

## 2019-10-21 LAB — CBC WITH DIFFERENTIAL/PLATELET
Abs Immature Granulocytes: 0.06 10*3/uL (ref 0.00–0.07)
Basophils Absolute: 0 10*3/uL (ref 0.0–0.1)
Basophils Relative: 0 %
Eosinophils Absolute: 0 10*3/uL (ref 0.0–0.5)
Eosinophils Relative: 0 %
HCT: 32.7 % — ABNORMAL LOW (ref 36.0–46.0)
Hemoglobin: 10.2 g/dL — ABNORMAL LOW (ref 12.0–15.0)
Immature Granulocytes: 3 %
Lymphocytes Relative: 25 %
Lymphs Abs: 0.6 10*3/uL — ABNORMAL LOW (ref 0.7–4.0)
MCH: 27.7 pg (ref 26.0–34.0)
MCHC: 31.2 g/dL (ref 30.0–36.0)
MCV: 88.9 fL (ref 80.0–100.0)
Monocytes Absolute: 0.2 10*3/uL (ref 0.1–1.0)
Monocytes Relative: 10 %
Neutro Abs: 1.5 10*3/uL — ABNORMAL LOW (ref 1.7–7.7)
Neutrophils Relative %: 62 %
Platelets: 175 10*3/uL (ref 150–400)
RBC: 3.68 MIL/uL — ABNORMAL LOW (ref 3.87–5.11)
RDW: 15.9 % — ABNORMAL HIGH (ref 11.5–15.5)
WBC: 2.4 10*3/uL — ABNORMAL LOW (ref 4.0–10.5)
nRBC: 0.8 % — ABNORMAL HIGH (ref 0.0–0.2)

## 2019-10-21 LAB — LACTATE DEHYDROGENASE: LDH: 381 U/L — ABNORMAL HIGH (ref 98–192)

## 2019-10-21 NOTE — ED Provider Notes (Signed)
[facility]-EMERGENCY DEPT Provider Note   CSN: [id number] Arrival date & time: 10/21/19  2124     History Chief Complaint  Patient presents with  . Fall  . Leg Pain  . Weakness    Yvonne [name] is a 54 y.o. female.  Patient to ED with complaint of progressive weakness in lower extremities over the last 2 weeks. She bought a cane to help with walking. She reports generalized LE pain without redness or swelling. Yesterday, she reports a fall but feels this was after her right foot folded/inverted while walking, causing a fall to her right side. No fever, abdominal pain, chest pain, headache, dizziness/lightheadedness.   The history is provided by the patient. No language interpreter was used.  Fall Pertinent negatives include no chest pain, no abdominal pain, no headaches and no shortness of breath.  Leg Pain Associated symptoms: no fever   Weakness Associated symptoms: no abdominal pain, no chest pain, no dizziness, no fever, no headaches, no nausea and no shortness of breath        Past Medical History:  Diagnosis Date  . Bipolar 1 disorder ([facility])   . Medical history non-contributory   . Mental disorder   . No pertinent past medical history     Patient Active Problem List   Diagnosis Date Noted  . Pressure injury of skin 06/13/2018  . Abnormal LFTs (liver function tests)   . Pancytopenia ([facility])   . Other pancytopenia ([facility]) 06/02/2018  . Orthostatic hypotension 05/30/2018  . Severe protein-calorie malnutrition ([facility]) 05/30/2018  . Hypocalcemia 05/30/2018  . Transaminitis 05/30/2018  . Fever 05/30/2018  . Thrombocytopenia ([facility]) 05/30/2018  . Severe bipolar I disorder, most recent episode depressed ([facility]) 06/01/2017  . Bipolar I disorder, current or most recent episode manic, with psychotic features ([facility])   . Affective psychosis, bipolar ([facility]) 01/05/2017  . Manic behavior ([facility])     Past Surgical History:  Procedure Laterality Date  . NO PAST  SURGERIES       OB History   No obstetric history on file.     Family History  Problem Relation Age of Onset  . Depression Sister   . Depression Brother   . Depression Sister   . CAD Mother   . Hypertension Father   . Diabetes Father   . Bipolar disorder Cousin     Social History   Tobacco Use  . Smoking status: Never Smoker  . Smokeless tobacco: Never Used  Substance Use Topics  . Alcohol use: No  . Drug use: No    Home Medications Prior to Admission medications   Medication Sig Start Date End Date Taking? Authorizing Provider  ARIPiprazole (ABILIFY) 15 MG tablet Take 1 tablet (15 mg total) by mouth daily. For mood control 06/23/17   Armandina Stammer I, NP  benztropine (COGENTIN) 0.5 MG tablet Take 1 tablet (0.5 mg total) by mouth daily. For prevention of drug induced tremors 06/23/17   Armandina Stammer I, NP  hydroxychloroquine (PLAQUENIL) 200 MG tablet Take 1 tablet (200 mg total) by mouth daily. Patient not taking: Reported on 08/29/2019 06/23/18   Delaine Lame, MD  hydrOXYzine (ATARAX/VISTARIL) 25 MG tablet Take 25 mg by mouth 3 (three) times daily as needed for anxiety.    [provider]  predniSONE (DELTASONE) 20 MG tablet Take 60 mg daily x 2 days then 40 mg daily x 2 days then 20 mg daily x 2 days 08/29/19   Charlynne Pander, MD  Vitamin  D, Ergocalciferol, (DRISDOL) 1.25 MG (50000 UT) CAPS capsule Take 50,000 Units by mouth every 14 (fourteen) days.    [provider]    Allergies    Heparin  Review of Systems   Review of Systems  Constitutional: Negative for chills and fever.  HENT: Negative.   Respiratory: Negative.  Negative for shortness of breath.   Cardiovascular: Negative.  Negative for chest pain.  Gastrointestinal: Negative.  Negative for abdominal pain and nausea.  Musculoskeletal: Negative.        See HPI.  Skin: Negative.   Neurological: Positive for weakness. Negative for dizziness, syncope, light-headedness and headaches.     Physical Exam Updated Vital Signs BP 113/80   Pulse 85   Temp 98.1 F (36.7 C) (Oral)   Resp 19   Ht 5\' 2"  (1.575 m)   Wt 63.5 kg   SpO2 97%   BMI 25.61 kg/m   Physical Exam Vitals and nursing note reviewed.  Constitutional:      Appearance: She is well-developed.  HENT:     Head: Normocephalic.  Cardiovascular:     Rate and Rhythm: Normal rate and regular rhythm.  Pulmonary:     Effort: Pulmonary effort is normal.     Breath sounds: Normal breath sounds.  Abdominal:     General: Bowel sounds are normal.     Palpations: Abdomen is soft.     Tenderness: There is no abdominal tenderness. There is no guarding or rebound.  Musculoskeletal:        General: No swelling or tenderness. Normal range of motion.     Cervical back: Normal range of motion and neck supple.     Right lower leg: No edema.     Left lower leg: No edema.     Comments: LE's bilaterally normal in appearance. No redness, warmth, swelling. Pulses present.   Skin:    General: Skin is warm and dry.     Findings: No rash.  Neurological:     Mental Status: She is alert and oriented to person, place, and time.     Sensory: No sensory deficit.     Coordination: Coordination normal.     Deep Tendon Reflexes: Reflexes normal.     ED Results / Procedures / Treatments   Labs (all labs ordered are listed, but only abnormal results are displayed) Labs Reviewed  CBC WITH DIFFERENTIAL/PLATELET  URINALYSIS, ROUTINE W REFLEX MICROSCOPIC  COMPREHENSIVE METABOLIC PANEL  LACTATE DEHYDROGENASE    EKG None  Radiology No results found.  Procedures Procedures (including critical care time)  Medications Ordered in ED Medications - No data to display  ED Course  I have reviewed the triage vital signs and the nursing notes.  Pertinent labs & imaging results that were available during my care of the patient were reviewed by me and considered in my medical decision making (see chart for details).    MDM  Rules/Calculators/A&P                      Patient to ED with progressive bilateral LE weakness and pain.   Chart reviewed. She has a history of symptoms, evaluated by rheumatology and found to have connective tissue disease, started on Plaquenil but no longer takes this. Unclear why. She also reports that her psychiatrist changes her medications one month ago but cannot specify what changes were made. Will check labs for any abnormality and reassess.   The patient has been observed for 8 hours. Labs  reviewed and are essentially stable. She has been given IV fluids for blood pressure on the low side of 99/73.   Attempt at ambulation unsuccessful as the patient reports she cannot move her legs. She reports this is secondary to pain not weakness. IV Toradol and 25 mcg Fentanyl provided and will reassess.   The patient continues to be unable to move her legs with any range. Cannot ambulate the patient.   No evidence of infection. No neurologic deficits on exam. The patient has a history of mental health disorders, so consider a psychiatric component.   Discussed the patient's presentation with [facility]. She confirms the patient's complaints as the patient has stated. Increasing bilateral LE pain and stiffness for the past 2 weeks, fall yesterday secondary to symptoms. She was able to relate that she is followed at the [place] where she sees rheumatology and that her prednisone dose was increased from 10 mg EOD to 10 mg every day one week ago. They were able to verify she is no longer taking Plaquenil.   The patient has been here for 9 hours under observation. She has had fluids for blood pressures that went to 99/73. There is some improvement after fluids.   The patient cannot ambulate. Will discuss admission with the hospitalist.    Final Clinical Impression(s) / ED Diagnoses Final diagnoses:  None   1. Lower extremity weakness  Rx / DC Orders ED Discharge Orders    None        Danne Harbor 10/22/19 0741    Charlynne Pander, MD 10/25/19 1622

## 2019-10-21 NOTE — ED Notes (Signed)
Purewick catheter applied and pt made aware that we need a urine sample.

## 2019-10-21 NOTE — ED Triage Notes (Signed)
EMS reports that pt states she started having weakness about 2 weeks ago. Pt was walking to her car today and fell, and reports bilateral leg pain from the fall. EMS reports negative stroke screen.

## 2019-10-22 ENCOUNTER — Inpatient Hospital Stay (HOSPITAL_COMMUNITY): Payer: No Typology Code available for payment source

## 2019-10-22 DIAGNOSIS — Z888 Allergy status to other drugs, medicaments and biological substances status: Secondary | ICD-10-CM | POA: Diagnosis not present

## 2019-10-22 DIAGNOSIS — Z818 Family history of other mental and behavioral disorders: Secondary | ICD-10-CM | POA: Diagnosis not present

## 2019-10-22 DIAGNOSIS — Z20822 Contact with and (suspected) exposure to covid-19: Secondary | ICD-10-CM | POA: Diagnosis present

## 2019-10-22 DIAGNOSIS — K754 Autoimmune hepatitis: Secondary | ICD-10-CM | POA: Diagnosis present

## 2019-10-22 DIAGNOSIS — M5127 Other intervertebral disc displacement, lumbosacral region: Secondary | ICD-10-CM | POA: Diagnosis present

## 2019-10-22 DIAGNOSIS — M60851 Other myositis, right thigh: Secondary | ICD-10-CM | POA: Diagnosis present

## 2019-10-22 DIAGNOSIS — Z8249 Family history of ischemic heart disease and other diseases of the circulatory system: Secondary | ICD-10-CM | POA: Diagnosis not present

## 2019-10-22 DIAGNOSIS — Z79899 Other long term (current) drug therapy: Secondary | ICD-10-CM | POA: Diagnosis not present

## 2019-10-22 DIAGNOSIS — Z833 Family history of diabetes mellitus: Secondary | ICD-10-CM | POA: Diagnosis not present

## 2019-10-22 DIAGNOSIS — S76111A Strain of right quadriceps muscle, fascia and tendon, initial encounter: Secondary | ICD-10-CM | POA: Diagnosis present

## 2019-10-22 DIAGNOSIS — R531 Weakness: Secondary | ICD-10-CM | POA: Diagnosis present

## 2019-10-22 DIAGNOSIS — M791 Myalgia, unspecified site: Secondary | ICD-10-CM

## 2019-10-22 DIAGNOSIS — D649 Anemia, unspecified: Secondary | ICD-10-CM | POA: Diagnosis not present

## 2019-10-22 DIAGNOSIS — E876 Hypokalemia: Secondary | ICD-10-CM | POA: Diagnosis not present

## 2019-10-22 DIAGNOSIS — W19XXXA Unspecified fall, initial encounter: Secondary | ICD-10-CM | POA: Diagnosis present

## 2019-10-22 DIAGNOSIS — R269 Unspecified abnormalities of gait and mobility: Secondary | ICD-10-CM | POA: Diagnosis not present

## 2019-10-22 DIAGNOSIS — R945 Abnormal results of liver function studies: Secondary | ICD-10-CM | POA: Diagnosis not present

## 2019-10-22 DIAGNOSIS — F319 Bipolar disorder, unspecified: Secondary | ICD-10-CM | POA: Diagnosis present

## 2019-10-22 DIAGNOSIS — M351 Other overlap syndromes: Secondary | ICD-10-CM | POA: Diagnosis present

## 2019-10-22 DIAGNOSIS — M60859 Other myositis, unspecified thigh: Secondary | ICD-10-CM | POA: Diagnosis not present

## 2019-10-22 DIAGNOSIS — M60852 Other myositis, left thigh: Secondary | ICD-10-CM | POA: Diagnosis present

## 2019-10-22 LAB — SEDIMENTATION RATE: Sed Rate: 32 mm/hr — ABNORMAL HIGH (ref 0–22)

## 2019-10-22 LAB — URINALYSIS, ROUTINE W REFLEX MICROSCOPIC
Bilirubin Urine: NEGATIVE
Glucose, UA: NEGATIVE mg/dL
Hgb urine dipstick: NEGATIVE
Ketones, ur: NEGATIVE mg/dL
Leukocytes,Ua: NEGATIVE
Nitrite: NEGATIVE
Protein, ur: NEGATIVE mg/dL
Specific Gravity, Urine: 1.006 (ref 1.005–1.030)
pH: 6 (ref 5.0–8.0)

## 2019-10-22 LAB — C-REACTIVE PROTEIN: CRP: 0.6 mg/dL (ref <1.0)

## 2019-10-22 LAB — SARS CORONAVIRUS 2 (TAT 6-24 HRS): SARS Coronavirus 2: NEGATIVE

## 2019-10-22 LAB — HIV ANTIBODY (ROUTINE TESTING W REFLEX): HIV Screen 4th Generation wRfx: NONREACTIVE

## 2019-10-22 LAB — CK: Total CK: 722 U/L — ABNORMAL HIGH (ref 38–234)

## 2019-10-22 MED ORDER — ARIPIPRAZOLE 10 MG PO TABS
15.0000 mg | ORAL_TABLET | Freq: Every day | ORAL | Status: DC
  Administered 2019-10-22 – 2019-10-26 (×5): 15 mg via ORAL
  Filled 2019-10-22: qty 2
  Filled 2019-10-22: qty 1
  Filled 2019-10-22 (×3): qty 2

## 2019-10-22 MED ORDER — HYDROCODONE-ACETAMINOPHEN 5-325 MG PO TABS: 1.0000 | ORAL_TABLET | ORAL | Status: DC | PRN

## 2019-10-22 MED ORDER — BENZTROPINE MESYLATE 0.5 MG PO TABS
0.5000 mg | ORAL_TABLET | Freq: Every day | ORAL | Status: DC
  Administered 2019-10-22 – 2019-10-26 (×5): 0.5 mg via ORAL
  Filled 2019-10-22 (×6): qty 1

## 2019-10-22 MED ORDER — FENTANYL CITRATE (PF) 100 MCG/2ML IJ SOLN
25.0000 ug | Freq: Once | INTRAMUSCULAR | Status: AC
  Administered 2019-10-22: 25 ug via INTRAVENOUS
  Filled 2019-10-22: qty 2

## 2019-10-22 MED ORDER — SODIUM CHLORIDE 0.9 % IV BOLUS
1000.0000 mL | Freq: Once | INTRAVENOUS | Status: AC
  Administered 2019-10-22: 1000 mL via INTRAVENOUS

## 2019-10-22 MED ORDER — PREDNISONE 5 MG PO TABS
10.0000 mg | ORAL_TABLET | Freq: Every day | ORAL | Status: DC
  Administered 2019-10-22 – 2019-10-26 (×5): 10 mg via ORAL
  Filled 2019-10-22 (×3): qty 2
  Filled 2019-10-22: qty 1
  Filled 2019-10-22 (×2): qty 2

## 2019-10-22 MED ORDER — ACETAMINOPHEN 650 MG RE SUPP: 650.0000 mg | Freq: Four times a day (QID) | RECTAL | Status: DC | PRN

## 2019-10-22 MED ORDER — HYDROXYZINE HCL 25 MG PO TABS: 25.0000 mg | ORAL_TABLET | Freq: Three times a day (TID) | ORAL | Status: DC | PRN

## 2019-10-22 MED ORDER — ACETAMINOPHEN 325 MG PO TABS: 650.0000 mg | ORAL_TABLET | Freq: Four times a day (QID) | ORAL | Status: DC | PRN

## 2019-10-22 MED ORDER — LIP MEDEX EX OINT
TOPICAL_OINTMENT | CUTANEOUS | Status: DC | PRN
  Filled 2019-10-22 (×2): qty 7

## 2019-10-22 MED ORDER — KETOROLAC TROMETHAMINE 30 MG/ML IJ SOLN
15.0000 mg | Freq: Once | INTRAMUSCULAR | Status: AC
  Administered 2019-10-22: 15 mg via INTRAVENOUS
  Filled 2019-10-22: qty 1

## 2019-10-22 MED ORDER — FONDAPARINUX SODIUM 2.5 MG/0.5ML ~~LOC~~ SOLN
2.5000 mg | SUBCUTANEOUS | Status: DC
  Administered 2019-10-22 – 2019-10-25 (×4): 2.5 mg via SUBCUTANEOUS
  Filled 2019-10-22 (×5): qty 0.5

## 2019-10-22 MED ORDER — SODIUM CHLORIDE 0.9% FLUSH
3.0000 mL | Freq: Two times a day (BID) | INTRAVENOUS | Status: DC
  Administered 2019-10-22 – 2019-10-26 (×5): 3 mL via INTRAVENOUS

## 2019-10-22 MED ORDER — SENNOSIDES-DOCUSATE SODIUM 8.6-50 MG PO TABS: 1.0000 | ORAL_TABLET | Freq: Every evening | ORAL | Status: DC | PRN

## 2019-10-22 NOTE — ED Notes (Signed)
Report given Mardea RN for [phone].

## 2019-10-22 NOTE — ED Notes (Signed)
Checked on pt; pt is still unable to provide a urine sample at this time

## 2019-10-22 NOTE — Consult Note (Signed)
Neurology Consultation Reason for Consult: Weakness Referring Physician: Andria Frames, a  CC: Weakness  History is obtained from: Patient  HPI: Yvonne [name] is a 54 y.o. female reports about a month of bilateral proximal muscle weakness and pain.  She states that initially it was pain that predominantly kept her from getting out of bed.  The pain has predominantly resolved, however she continues to have significant muscle weakness.  She fell today and is for this reason that she presented to the hospital today.   Apparently she was started on Plaquenil in mid February.  She also has a history of bipolar disorder and is on Abilify.  She takes Cogentin and hydroxyzine.  She denies sensory changes, diplopia, facial weakness, trouble swallowing, or other symptoms.  She denies pain on muscle palpation today, but states that a couple of weeks ago it would have been very painful.  According to an ER note in February she had recently been started on Plaquenil, but I am uncertain how long she was on this for.  ROS: A 14 point ROS was performed and is negative except as noted in the HPI.   Past Medical History:  Diagnosis Date  . Bipolar 1 disorder ([place])   . Medical history non-contributory   . Mental disorder   . No pertinent past medical history      Family History  Problem Relation Age of Onset  . Depression Sister   . Depression Brother   . Depression Sister   . CAD Mother   . Hypertension Father   . Diabetes Father   . Bipolar disorder Cousin      Social History:  reports that she has never smoked. She has never used smokeless tobacco. She reports that she does not drink alcohol or use drugs.   Exam: Current vital signs: BP 109/70   Pulse 97   Temp 98.1 F (36.7 C) (Oral)   Resp 16   Ht _0  (1.575 m)   Wt 63.5 kg   SpO2 99%   BMI 25.61 kg/m  Vital signs in last 24 hours: Temp:  [98.1 F (36.7 C)] 98.1 F (36.7 C) (04/11 2133) Pulse Rate:  [69-106] 97 (04/12  1315) Resp:  [16-20] 16 (04/12 1315) BP: (99-125)/(63-89) 109/70 (04/12 1315) SpO2:  [94 %-100 %] 99 % (04/12 1315) Weight:  [63.5 kg] 63.5 kg (04/11 2139)   Physical Exam  Constitutional: Appears well-developed and well-nourished.  Psych: Affect appropriate to situation Eyes: No scleral injection HENT: No OP obstrucion MSK: no joint deformities.  Cardiovascular: Normal rate and regular rhythm.  Respiratory: Effort normal, non-labored breathing GI: Soft.  No distension. There is no tenderness.  Skin: WDI  Neuro: Mental Status: Patient is awake, alert, oriented to person, place, month, year, and situation. Patient is able to give a clear and coherent history. No signs of aphasia or neglect Cranial Nerves: II: Visual Fields are full. Pupils are equal, round, and reactive to light.   III,IV, VI: EOMI without ptosis or diploplia.  V: Facial sensation is symmetric to temperature VII: Facial movement is symmetric.  VIII: hearing is intact to voice X: Uvula elevates symmetrically XI: Shoulder shrug is symmetric. XII: tongue is midline without atrophy or fasciculations.  Motor: Tone is normal. Bulk is normal. 5/5 strength was present in bilateral upper extremities without drift, she has 4/5 weakness of her lower extremities though gives poor effort, 5/5 distally. Sensory: Sensation is symmetric to light touch and temperature in the arms and legs.  Intact  proprioception in the toes. Deep Tendon Reflexes: 2+ and symmetric in the biceps and patellae.  Cerebellar: FNF intact bilaterally   I have reviewed labs in epic and the results pertinent to this consultation are: ESR 32 CRP-normal CK-722   Impression: 54 year old female with a history of connective tissue disease being treated with Plaquenil.  Plaquenil certainly can cause a myopathy, but she has not been on it for at least the past 10 days or so.  I do wonder if her CKs were significantly higher during her period of muscle  pain a few weeks ago, and that would make the 722 a downtrending number.  Abilify can cause neuroleptic malignant syndrome, but the history does not sound like there was ever a clear episode suspicious for this.  Recommendations: 1) repeat CK in the AM 2) continue to hold plaquenil,  3) thigh MRI to look for evidence of inflammation.    Roland Rack, MD [facility] [phone]  If 7pm- 7am, please page neurology on call as listed in [place].

## 2019-10-22 NOTE — ED Notes (Signed)
Patient given breakfast tray and able to feed self. Patient repositioned in bed.

## 2019-10-22 NOTE — ED Notes (Signed)
Pt cleaned and changed adult brief. Call bell within reach, no needs expressed at this time.

## 2019-10-22 NOTE — ED Notes (Signed)
Attempted to ambulate pt with the help of Lynnell Chad, [place]. Pt stated that she was unable to stand, and that she could not even move her legs while in bed. Pt was able to dorsiflex and plantarflex feet, and well as briefly lift each leg off of the bed about a cm.

## 2019-10-22 NOTE — Evaluation (Signed)
Physical Therapy Evaluation Patient Details Name: Yvonne [name] MRN: [id number] DOB: Apr 11, 1966 Today's Date: 10/22/2019   History of Present Illness  54 y.o. 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  Clinical Impression  Pt admitted with above diagnosis. Pt ambulated 70' with RW, distance limited by fatigue, no loss of balance. Mod assist for bed mobility. Pt is a vague historian, she stated she hasn't walked in a month, but also stated she was independently sponge bathing on the toilet.  Pt currently with functional limitations due to the deficits listed below (see PT Problem List). Pt will benefit from skilled PT to increase their independence and safety with mobility to allow discharge to the venue listed below.       Follow Up Recommendations Home health PT;Supervision for mobility/OOB    Equipment Recommendations  Rolling walker with 5" wheels    Recommendations for Other Services       Precautions / Restrictions Precautions Precautions: Fall Precaution Comments: pt reports she fell 2 weeks ago Restrictions Weight Bearing Restrictions: No      Mobility  Bed Mobility Overal bed mobility: Needs Assistance Bed Mobility: Rolling;Sidelying to Sit Rolling: Min assist Sidelying to sit: Mod assist       General bed mobility comments: VCs for technique, mod A to raise trunk  Transfers Overall transfer level: Needs assistance Equipment used: Rolling walker (2 wheeled) Transfers: Sit to/from Stand Sit to Stand: Min assist         General transfer comment: VCs hand placement  Ambulation/Gait Ambulation/Gait assistance: Min guard Gait Distance (Feet): 60 Feet Assistive device: Rolling walker (2 wheeled) Gait Pattern/deviations: Step-through pattern;Decreased stride length Gait velocity: decr   General Gait Details:  distance limited by fatigue  Stairs            Wheelchair Mobility    Modified Rankin (Stroke Patients Only)       Balance Overall balance assessment: Needs assistance;History of Falls   Sitting balance-Leahy Scale: Good     Standing balance support: Single extremity supported Standing balance-Leahy Scale: Poor Standing balance comment: reliant upon single UE support                             Pertinent Vitals/Pain Pain Assessment: Faces Faces Pain Scale: Hurts little more Pain Location: B thighs Pain Descriptors / Indicators: Aching Pain Intervention(s): Limited activity within patient's tolerance;Monitored during session    Home Living Family/patient expects to be discharged to:: Group home                      Prior Function           Comments: pt is vague historian, stated she hasn't walked for a month but came in to hospital with her cane, she stated she was sponge bathing on the toilet independently     Hand Dominance        Extremity/Trunk Assessment   Upper Extremity Assessment Upper Extremity Assessment: Overall WFL for tasks assessed    Lower Extremity Assessment Lower Extremity Assessment: Overall WFL for tasks assessed(R knee extension -4/5, L knee ext 4/5; R knee ext AROM -15*)    Cervical / Trunk Assessment Cervical / Trunk Assessment: Normal  Communication   Communication: No difficulties  Cognition Arousal/Alertness: Awake/alert Behavior During Therapy: WFL for tasks assessed/performed Overall Cognitive  Status: Within Functional Limits for tasks assessed                                        General Comments      Exercises     Assessment/Plan    PT Assessment Patient needs continued PT services  PT Problem List Decreased strength;Decreased range of motion;Decreased activity tolerance;Decreased balance;Decreased mobility;Pain       PT Treatment Interventions Therapeutic  activities;Therapeutic exercise;Functional mobility training;Patient/family education    PT Goals (Current goals can be found in the Care Plan section)  Acute Rehab PT Goals Patient Stated Goal: be able to walk PT Goal Formulation: With patient Time For Goal Achievement: 11/05/19 Potential to Achieve Goals: Good    Frequency Min 3X/week   Barriers to discharge        Co-evaluation               AM-PAC PT "6 Clicks" Mobility  Outcome Measure Help needed turning from your back to your side while in a flat bed without using bedrails?: A Little Help needed moving from lying on your back to sitting on the side of a flat bed without using bedrails?: A Lot Help needed moving to and from a bed to a chair (including a wheelchair)?: A Little Help needed standing up from a chair using your arms (e.g., wheelchair or bedside chair)?: A Little Help needed to walk in hospital room?: A Little Help needed climbing 3-5 steps with a railing? : A Lot 6 Click Score: 16    End of Session Equipment Utilized During Treatment: Gait belt Activity Tolerance: Patient tolerated treatment well Patient left: in chair;with call bell/phone within reach;with chair alarm set Nurse Communication: Mobility status PT Visit Diagnosis: Difficulty in walking, not elsewhere classified (R26.2);Pain Pain - Right/Left: Right Pain - part of body: Leg    Time: 3570-1779 PT Time Calculation (min) (ACUTE ONLY): 18 min   Charges:   PT Evaluation $PT Eval Low Complexity: 1 Low          Ralene Bathe Kistler PT 10/22/2019  Acute Rehabilitation Services Pager [phone] Office [phone]

## 2019-10-22 NOTE — Progress Notes (Signed)
Patient arrived to unit A/O/V, denies pain at this time. Skin intact. Will continue to monitor.

## 2019-10-22 NOTE — ED Notes (Signed)
Per patient ok to talk to patients sister and give information.   Sister Birdie Riddle) [phone] (home)  [phone] (Cell).

## 2019-10-22 NOTE — H&P (Signed)
History and Physical    [name] DOB: [birth year] DOA: 10/21/2019  PCP: [facility] Patient coming from: [place]  I have personally briefly reviewed patient's old medical records in [place]  Chief Complaint: LE weakness and pain  HPI: Yvonne [name] is a 54 y.o. 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  2-3 weeks of pain, unable to get out of bed.  Group home caretaker reports she is in bed mostly, few weeks prior she could ambulate with assistance.  However, in past two weeks she has been having pain, initially in arms and then subsequently in her lower extremities.  She reports she subsequently had pain in her legs resulting in weakness.  Caregiver reports, she and another roommate need to stand her up.  She is unable to sit up or stand up on her own.  Per the caregiver, once she stands up she is able to walk with her walker in the past week.  However in the past 24 hours she has been less mobile.  However, with EMS she was able to walk with her walker. - Med list per caregiver -  Prednisone 10 mg daily (previously every other day, change made on April 1) - seen at the [facility] in [place]  - escitalopram 10 mg daily - aripiprazole 30 mg daily - benztropine 0.25 mg daily - B12 q4 weeks  Patient is not the most reliable historian - she reported to me she has not been able to get up or walk for the past two weeks, she reports pain in her bilateral legs, beginning more proximally and moving down.  She denies any other symptoms, no fevers, chills, nausea/vomiting.  No back pain.  She denies any incontinence, bowel/bladder.  No chest pain, palpitations, sob.  Called over to [facility], unfortunately no rheumatologist present in clinic.  They did confirm the patient was seen in  consultation on 4/2.   Prior evaluation thru [facility] copied below: " 1. Connective tissue disease (CMS-HCC) History of fatigue generalized myalgia. No known Raynaud's/mouth ulcers/hair loss/kidney failure/serositis Elevated ANA 1/320 homogeneous and speckled. Elevation double-stranded DNA 62, elevation RNP 7.7, elevation anti-Smith 8.0, elevation SSA 8.0, elevation SSB 8.0, elevation anti-chromatin antibody 8.0, negative anti-SCL 70. Negative anticentromere antibody. Negative anti-Jo 1. Normal serum haptoglobin Low complement C3 at 53. Low complement C4. Elevation ESR 36 Negative APS lab Corrected pancytopenia/ prior bone marrow biopsy negative for leukemia lymphoma.  Start patient on Plaquenil 200 mg daily weightbase, referral to ophthalmology - Ambulatory Referral to Ophthalmology"   [facility] Note faxed, initial consultation performed 4/2 by Dr. Maudie Mercury  Impression was that the patient had SLE given elevation in anti-Sm and Anti-dsDNA Per CC - patient was already having weakness in the setting of tapering off of her steroids Lupus myositis was given consideration despite normal CK Dr. Maudie Mercury had mentioned wanting to start plaquenil, at a lower dose than 400 mg due to her weight, consider thigh MRI to see if there is any inflammation  She is separately following with Dr. Lynita Lombard at the [facility] for AI Hepatitis.  Review of Systems: As per HPI otherwise 10 point review of systems negative.    Past Medical History:  Diagnosis Date  . Bipolar 1 disorder ([place])   . Medical history non-contributory   . Mental disorder   . No pertinent  past medical history     Past Surgical History:  Procedure Laterality Date  . NO PAST SURGERIES       reports that she has never smoked. She has never used smokeless tobacco. She reports that she does not drink alcohol or use drugs.  Allergies  Allergen Reactions  . Heparin     HITT    Family History  Problem Relation  Age of Onset  . Depression Sister   . Depression Brother   . Depression Sister   . CAD Mother   . Hypertension Father   . Diabetes Father   . Bipolar disorder Cousin      Prior to Admission medications   Medication Sig Start Date End Date Taking? Authorizing Provider  ARIPiprazole (ABILIFY) 15 MG tablet Take 1 tablet (15 mg total) by mouth daily. For mood control 06/23/17  Yes Lindell Spar I, NP  benztropine (COGENTIN) 0.5 MG tablet Take 1 tablet (0.5 mg total) by mouth daily. For prevention of drug induced tremors 06/23/17  Yes Lindell Spar I, NP  hydrOXYzine (ATARAX/VISTARIL) 25 MG tablet Take 25 mg by mouth 3 (three) times daily as needed for anxiety.   Yes [provider]  hydroxychloroquine (PLAQUENIL) 200 MG tablet Take 1 tablet (200 mg total) by mouth daily. Patient not taking: Reported on 08/29/2019 06/23/18   Purohit, Konrad Dolores, MD  predniSONE (DELTASONE) 20 MG tablet Take 60 mg daily x 2 days then 40 mg daily x 2 days then 20 mg daily x 2 days Patient not taking: Reported on 10/22/2019 08/29/19   Drenda Freeze, MD    Physical Exam: Vitals:   10/22/19 0830 10/22/19 0915 10/22/19 0919 10/22/19 0945  BP: 119/64 109/63  113/77  Pulse: 75 80 80 80  Resp: _0 Temp:      TempSrc:      SpO2: 96% 100% 100% 100%  Weight:      Height:        Vitals:   10/22/19 0830 10/22/19 0915 10/22/19 0919 10/22/19 0945  BP: 119/64 109/63  113/77  Pulse: 75 80 80 80  Resp: _1 Temp:      TempSrc:      SpO2: 96% 100% 100% 100%  Weight:      Height:         Constitutional: Softspoken, NAD, calm, appears comfortable Eyes: PERRL, lids and conjunctivae normal ENMT: Mucous membranes are moist. Posterior pharynx clear of any exudate or lesions.Normal dentition.  Neck: normal, supple, no masses, no thyromegaly Respiratory: clear to auscultation bilaterally, no wheezing, no crackles. Normal respiratory effort. No accessory muscle use.  Cardiovascular: Regular rate  and rhythm, no murmurs / rubs / gallops. No extremity edema. 2+ pedal pulses.  Abdomen: no tenderness, no masses palpated. No hepatosplenomegaly. Bowel sounds positive.  Musculoskeletal: no clubbing / cyanosis. No joint deformity upper and lower extremities. Good ROM, no contractures. Normal muscle tone.  Skin: no rashes, lesions, ulcers. No induration Neurologic: CN 2-12 grossly intact, BL UE 4+/5, BL LE 3/5 (reports limited by pain proximally) Psychiatric: Normal judgment and insight. Alert and oriented x 3. Normal mood.    Labs on Admission: I have personally reviewed following labs and imaging studies  CBC: Recent Labs  Lab 10/21/19 2307  WBC 2.4*  NEUTROABS 1.5*  HGB 10.2*  HCT 32.7*  MCV 88.9  PLT 124   Basic Metabolic Panel: Recent Labs  Lab 10/21/19 2307  NA 139  K 3.6  CL 106  CO2 25  GLUCOSE 105*  BUN 15  CREATININE 0.60  CALCIUM 8.2*   GFR: Estimated Creatinine Clearance: 69.6 mL/min (by C-G formula based on SCr of 0.6 mg/dL). Liver Function Tests: Recent Labs  Lab 10/21/19 2307  AST 148*  ALT 54*  ALKPHOS 64  BILITOT 0.9  PROT 6.9  ALBUMIN 2.9*   No results for input(s): LIPASE, AMYLASE in the last 168 hours. No results for input(s): AMMONIA in the last 168 hours. Coagulation Profile: No results for input(s): INR, PROTIME in the last 168 hours. Cardiac Enzymes: No results for input(s): CKTOTAL, CKMB, CKMBINDEX, TROPONINI in the last 168 hours. BNP (last 3 results) No results for input(s): PROBNP in the last 8760 hours. HbA1C: No results for input(s): HGBA1C in the last 72 hours. CBG: No results for input(s): GLUCAP in the last 168 hours. Lipid Profile: No results for input(s): CHOL, HDL, LDLCALC, TRIG, CHOLHDL, LDLDIRECT in the last 72 hours. Thyroid Function Tests: No results for input(s): TSH, T4TOTAL, FREET4, T3FREE, THYROIDAB in the last 72 hours. Anemia Panel: No results for input(s): VITAMINB12, FOLATE, FERRITIN, TIBC, IRON, RETICCTPCT  in the last 72 hours. Urine analysis:    Component Value Date/Time   COLORURINE YELLOW 10/22/2019 0509   APPEARANCEUR CLEAR 10/22/2019 0509   LABSPEC 1.006 10/22/2019 0509   PHURINE 6.0 10/22/2019 0509   GLUCOSEU NEGATIVE 10/22/2019 0509   HGBUR NEGATIVE 10/22/2019 0509   BILIRUBINUR NEGATIVE 10/22/2019 0509   KETONESUR NEGATIVE 10/22/2019 0509   PROTEINUR NEGATIVE 10/22/2019 0509   UROBILINOGEN 0.2 02/27/2014 0501   NITRITE NEGATIVE 10/22/2019 0509   LEUKOCYTESUR NEGATIVE 10/22/2019 0509    Radiological Exams on Admission: No results found.  EKG: Independently reviewed.   Assessment/Plan Rona Tomson is a 54 y.o. 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  # Acute / subacute progressive BL LE Weakness # SLE vs MCTD - Hx of Positive ANA, ASMA, DS-DNA, ENA, Chromatin, SS-A and SS-B Ab and RNA Ab - previously on plaquenil and higher doses of steroids was being tapered off by GI for AI Hepatitis (pieced together from records) and now with progressive weakness in the past 2-3 weeks, worsened in the past two days.  Obtained records from recent [facility] rheumatology appointment with Dr. Maudie Mercury and consideration for Lupus myositis despite normal CK (attributed to body weight) but had planned for follow-up and patient had been resumed on prednisone 10 mg daily whereas just prior had been on 10 mg every other day in her taper. - I discussed the case with [facility] Transfer line, Rheumatology, Dr. Annabelle Harman and appreciate assistance - consideration for myelitis in setting of Lupus, he did not think myositis based off my hx and patient's normal CK - obtained ESR, CRP and CK - will obtain MRI T and L spine (also low suspicion of cord compression in absence of other symptoms, no back pain, incontinence, reported ability to ambulate once standing) but will eval on MRI -  Consulted Neurology, Dr. Leonel Ramsay and appreciate evaluation, we are unable to do [facility] but will assess and appreciate recommendations - if MRI is suggestive of myelitis would obtain LP and if CSF c/w viral cause, would send work-up otherwise may need pulse dose steroids - if further immunosuppression or transfer warranted, [facility] to accept, d/w Dr. 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 Annabelle Harman at [facility], Neurology, Dr. Leonel Ramsay Admission status: med-surg   Truddie Hidden MD [facility] Pager [phone]  If 7PM-7AM, please contact night-coverage [email or web address] Password [facility]  10/22/2019, 9:50 AM

## 2019-10-22 NOTE — Plan of Care (Signed)

## 2019-10-23 DIAGNOSIS — K754 Autoimmune hepatitis: Secondary | ICD-10-CM | POA: Diagnosis not present

## 2019-10-23 DIAGNOSIS — M60859 Other myositis, unspecified thigh: Secondary | ICD-10-CM | POA: Diagnosis not present

## 2019-10-23 DIAGNOSIS — R531 Weakness: Secondary | ICD-10-CM | POA: Diagnosis not present

## 2019-10-23 DIAGNOSIS — R945 Abnormal results of liver function studies: Secondary | ICD-10-CM | POA: Diagnosis not present

## 2019-10-23 LAB — BASIC METABOLIC PANEL
Anion gap: 6 (ref 5–15)
BUN: 12 mg/dL (ref 6–20)
CO2: 24 mmol/L (ref 22–32)
Calcium: 8 mg/dL — ABNORMAL LOW (ref 8.9–10.3)
Chloride: 114 mmol/L — ABNORMAL HIGH (ref 98–111)
Creatinine, Ser: 0.6 mg/dL (ref 0.44–1.00)
GFR calc Af Amer: >60 mL/min (ref >60)
GFR calc non Af Amer: >60 mL/min (ref >60)
Glucose, Bld: 95 mg/dL (ref 70–99)
Potassium: 3.3 mmol/L — ABNORMAL LOW (ref 3.5–5.1)
Sodium: 144 mmol/L (ref 135–145)

## 2019-10-23 LAB — CBC
HCT: 31.3 % — ABNORMAL LOW (ref 36.0–46.0)
Hemoglobin: 9.5 g/dL — ABNORMAL LOW (ref 12.0–15.0)
MCH: 27.9 pg (ref 26.0–34.0)
MCHC: 30.4 g/dL (ref 30.0–36.0)
MCV: 92.1 fL (ref 80.0–100.0)
Platelets: 160 10*3/uL (ref 150–400)
RBC: 3.4 MIL/uL — ABNORMAL LOW (ref 3.87–5.11)
RDW: 16.2 % — ABNORMAL HIGH (ref 11.5–15.5)
WBC: 2.3 10*3/uL — ABNORMAL LOW (ref 4.0–10.5)
nRBC: 0.9 % — ABNORMAL HIGH (ref 0.0–0.2)

## 2019-10-23 LAB — IRON AND TIBC
Iron: 49 ug/dL (ref 28–170)
Saturation Ratios: 29 % (ref 10.4–31.8)
TIBC: 168 ug/dL — ABNORMAL LOW (ref 250–450)
UIBC: 119 ug/dL

## 2019-10-23 LAB — RETICULOCYTES
Immature Retic Fract: 25.7 % — ABNORMAL HIGH (ref 2.3–15.9)
RBC.: 3.53 MIL/uL — ABNORMAL LOW (ref 3.87–5.11)
Retic Count, Absolute: 71.7 10*3/uL (ref 19.0–186.0)
Retic Ct Pct: 2 % (ref 0.4–3.1)

## 2019-10-23 LAB — CK: Total CK: 549 U/L — ABNORMAL HIGH (ref 38–234)

## 2019-10-23 LAB — FERRITIN: Ferritin: 960 ng/mL — ABNORMAL HIGH (ref 11–307)

## 2019-10-23 LAB — FOLATE: Folate: 6.7 ng/mL (ref >5.9)

## 2019-10-23 LAB — VITAMIN B12: Vitamin B-12: 433 pg/mL (ref 180–914)

## 2019-10-23 MED ORDER — POTASSIUM CHLORIDE CRYS ER 20 MEQ PO TBCR
40.0000 meq | EXTENDED_RELEASE_TABLET | Freq: Once | ORAL | Status: AC
  Administered 2019-10-23: 40 meq via ORAL
  Filled 2019-10-23: qty 2

## 2019-10-23 MED ORDER — SODIUM CHLORIDE 0.9 % IV SOLN: INTRAVENOUS | Status: DC

## 2019-10-23 NOTE — Evaluation (Addendum)
Occupational Therapy Evaluation Patient Details Name: Yvonne [name] MRN: [id number] DOB: Sep 30, 1965 Today's Date: 10/23/2019    History of Present Illness 54 y.o. 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   Clinical Impression   This 54 yo female admitted with above presents to acute OT with PLOF of independent with basic ADLs. Currently pt is min A with RW when up on her feet and needs Mod A for LB ADLs/setup for UB ADLs. She will benefit from acute OT with follow up HHOT v. SNF depending in on what A is at group home.    Follow Up Recommendations  Home health OT;Supervision/Assistance - 24 hour(if group home cannot provide physical A then SNF)    Equipment Recommendations  3 in 1 bedside commode;Tub/shower seat       Precautions / Restrictions Precautions Precautions: Fall Precaution Comments: pt reports she fell 2 weeks ago Restrictions Weight Bearing Restrictions: No      Mobility Bed Mobility Overal bed mobility: Needs Assistance Bed Mobility: Sit to Supine       Sit to supine: Min guard      Transfers Overall transfer level: Needs assistance Equipment used: Rolling walker (2 wheeled) Transfers: Sit to/from Stand Sit to Stand: Min assist              Balance Overall balance assessment: History of Falls;Needs assistance Sitting-balance support: No upper extremity supported;Feet supported Sitting balance-Leahy Scale: Good     Standing balance support: No upper extremity supported;During functional activity Standing balance-Leahy Scale: Fair Standing balance comment: standing at sink to wash hands                           ADL either performed or assessed with clinical judgement   ADL Overall ADL's : Needs assistance/impaired Eating/Feeding: Independent;Sitting   Grooming: Minimal  assistance;Standing   Upper Body Bathing: Set up;Sitting   Lower Body Bathing: Minimal assistance;Sit to/from stand   Upper Body Dressing : Set up;Sitting   Lower Body Dressing: Moderate assistance Lower Body Dressing Details (indicate cue type and reason): min A sit<>stand Toilet Transfer: Minimal assistance;Ambulation;RW;Regular Toilet;Grab bars   Toileting- Clothing Manipulation and Hygiene: Minimal assistance;Sit to/from stand         General ADL Comments: Patient reports she would normally cross her legs to get to her feet, currently she cannot cross her legs without A of her UEs and even then her LEs will not maintained crossed.     Vision Patient Visual Report: No change from baseline              Pertinent Vitals/Pain Pain Assessment: Faces Faces Pain Scale: Hurts even more Pain Location: right thigh intermittently Pain Descriptors / Indicators: Grimacing;Guarding;Sharp Pain Intervention(s): Limited activity within patient's tolerance;Monitored during session;Repositioned     Hand Dominance Right   Extremity/Trunk Assessment Upper Extremity Assessment Upper Extremity Assessment: RUE deficits/detail;LUE deficits/detail RUE Deficits / Details: Limited AROM at shoulder (~90 degrees), rest WFL LUE Deficits / Details: Limited AROM at shoulder (~120 degrees); rest WFL   Lower Extremity Assessment Lower Extremity Assessment: (Pt with difficulty sitting marching with RLE>LLE)       Communication Communication Communication: No difficulties   Cognition Arousal/Alertness: Awake/alert Behavior During Therapy: Flat affect Overall Cognitive Status: Within Functional Limits for tasks assessed  Home Living Family/patient expects to be discharged to:: Group home Living Arrangements: Group Home Available Help at Discharge: Personal care attendant;Available 24 hours/day Type of Home: House        Home Layout: One level     Bathroom Shower/Tub: Tub/shower unit;Curtain   Biochemist, clinical: Standard     Home Equipment: Cane - single point          Prior Functioning/Environment Level of Independence: Independent                 OT Problem List: Decreased strength;Decreased range of motion;Impaired balance (sitting and/or standing);Pain      OT Treatment/Interventions: Self-care/ADL training;DME and/or AE instruction;Patient/family education;Balance training    OT Goals(Current goals can be found in the care plan section) Acute Rehab OT Goals Patient Stated Goal: to my legs back OT Goal Formulation: With patient Time For Goal Achievement: 11/06/19 Potential to Achieve Goals: Good  OT Frequency: Min 2X/week              AM-PAC OT "6 Clicks" Daily Activity     Outcome Measure Help from another person eating meals?: None Help from another person taking care of personal grooming?: A Little Help from another person toileting, which includes using toliet, bedpan, or urinal?: A Little Help from another person bathing (including washing, rinsing, drying)?: A Little Help from another person to put on and taking off regular upper body clothing?: A Little Help from another person to put on and taking off regular lower body clothing?: A Little 6 Click Score: 19   End of Session Equipment Utilized During Treatment: Gait belt;Rolling walker  Activity Tolerance: Patient tolerated treatment well Patient left: in bed;with call bell/phone within reach;with bed alarm set  OT Visit Diagnosis: Unsteadiness on feet (R26.81);Muscle weakness (generalized) (M62.81);Pain Pain - Right/Left: Right Pain - part of body: (thigh)                Time: 2774-1287 OT Time Calculation (min): 16 min Charges:  OT General Charges $OT Visit: 1 Visit OT Evaluation $OT Eval Moderate Complexity: 1 Mod OT Treatments $Self Care/Home Management : 8-22 mins  Golden Circle, OTR/L Acute [employer] Pager [phone] Office [phone]     Almon Register 10/23/2019, 10:28 AM

## 2019-10-23 NOTE — Progress Notes (Signed)
PROGRESS NOTE    Yvonne [name]  ZOX:[id number] DOB: [birth year] DOA: 10/21/2019 PCP: [facility], [facility]   Brief Narrative:  HPI per Dr. Truddie Hidden on 10/22/19 Yvonne [name] is a 54 y.o. 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  2-3 weeks of pain, unable to get out of bed.  Group home caretaker reports she is in bed mostly, few weeks prior she could ambulate with assistance.  However, in past two weeks she has been having pain, initially in arms and then subsequently in her lower extremities.  She reports she subsequently had pain in her legs resulting in weakness.  Caregiver reports, she and another roommate need to stand her up.  She is unable to sit up or stand up on her own.  Per the caregiver, once she stands up she is able to walk with her walker in the past week.  However in the past 24 hours she has been less mobile.  However, with EMS she was able to walk with her walker. - Med list per caregiver -  Prednisone 10 mg daily (previously every other day, change made on April 1) - seen at the [facility] in [place]  - escitalopram 10 mg daily - aripiprazole 30 mg daily - benztropine 0.25 mg daily - B12 q4 weeks  Patient is not the most reliable historian - she reported to me she has not been able to get up or walk for the past two weeks, she reports pain in her bilateral legs, beginning more proximally and moving down.  She denies any other symptoms, no fevers, chills, nausea/vomiting.  No back pain.  She denies any incontinence, bowel/bladder.  No chest pain, palpitations, sob.  Called over to [facility], unfortunately no rheumatologist present in clinic.  They did confirm the patient was seen in consultation on 4/2.   Prior evaluation thru [facility] copied below: " 1. Connective tissue disease  (CMS-HCC) History of fatigue generalized myalgia. No known Raynaud's/mouth ulcers/hair loss/kidney failure/serositis Elevated ANA 1/320 homogeneous and speckled. Elevation double-stranded DNA 62, elevation RNP 7.7, elevation anti-Smith 8.0, elevation SSA 8.0, elevation SSB 8.0, elevation anti-chromatin antibody 8.0, negative anti-SCL 70. Negative anticentromere antibody. Negative anti-Jo 1. Normal serum haptoglobin Low complement C3 at 53. Low complement C4. Elevation ESR 36 Negative APS lab Corrected pancytopenia/ prior bone marrow biopsy negative for leukemia lymphoma.  Start patient on Plaquenil 200 mg daily weightbase, referral to ophthalmology - Ambulatory Referral to Ophthalmology"   [facility] Note faxed, initial consultation performed 4/2 by Dr. Maudie Mercury  Impression was that the patient had SLE given elevation in anti-Sm and Anti-dsDNA Per CC - patient was already having weakness in the setting of tapering off of her steroids Lupus myositis was given consideration despite normal CK Dr. Maudie Mercury had mentioned wanting to start plaquenil, at a lower dose than 400 mg due to her weight, consider thigh MRI to see if there is any inflammation  She is separately following with Dr. Lynita Lombard at the [facility] clinic for AI Hepatitis.  **Interim History Feels extremely weak in her legs and will need to discuss with rheumatology about further treatment options and recommendations.  Neurology recommends asking rheumatology if she would require another biopsy or if she needs pulse dose steroids.  Will need to reach out to rheumatology in the a.m. and her CKs are trending down and will continue to hold  her Plaquenil for now.  Assessment & Plan:   Active Problems:   Weakness  Acute / subacute progressive BL LE Weakness SLE vs MCTD - Hx of Positive ANA, ASMA, DS-DNA, ENA, Chromatin, SS-A and SS-B Ab and RNA Ab -Previously on plaquenil and higher doses of steroids was being tapered off by  GI for AI Hepatitis (pieced together from records) and now with progressive weakness in the past 2-3 weeks, worsened in the past two days.   -Dr. Andria Frames Obtained records from recent [facility] rheumatology appointment with Dr. Maudie Mercury and consideration for Lupus myositis despite normal CK (attributed to body weight) but had planned for follow-up and patient had been resumed on prednisone 10 mg daily whereas just prior had been on 10 mg every other day in her taper. -Dr. Andria Frames discussed the case with [facility] Transfer line, Rheumatology, Dr. Annabelle Harman and appreciate assistance - consideration for myelitis in setting of Lupus, he did not think myositis based off hx and patient's normal CK -obtained ESR, CRP and CK -will obtain MRI T and L spine (also low suspicion of cord compression in absence of other symptoms, no back pain, incontinence, reported ability to ambulate once standing) but will eval on MRI -MRI T Spine and L Spine showed "No acute abnormality. No abnormal cord signal. . Small shallow broad-based right subarticular and foraminal disc protrusion at L5-S1 contacting the descending right S1 nerve root." -Left and Right Femur MRI showed "Patchy muscle edema involving the bilateral quadriceps, adductor, and hamstring muscles, consistent with myositis.  Superimposed partial tear (grade 2 injury) involving the indirect tendon and inner muscle of the right mid rectus femoris.  -Consulted Neurology, Dr. Leonel Ramsay and appreciate evaluation, we are unable to do [facility] but will assess and appreciate recommendations -Dr. Leonel Ramsay recommending continued repeat CKs and this is trending down -I spoke with Dr. Leonel Ramsay today who recommends discussion with Rheumatology for his myositis and questioning if they would want to rebiopsy or start pulse dose steroids given the seems more like a rheumatological issue and is more out of his scope of practice.  Will need to find out more from the rheumatologist as we do not have  rheumatology service here and will need to call again in a.m; patient may benefit from pulsed dose steroids with Solu-Medrol 1 g for 3 days and then will need rheumatology to further direct care -We will need to discuss the case with [facility] and if further immunosuppression or transfer warranted, [facility] to accept, d/w Dr. Annabelle Harman, but at present no beds and felt appropriate for continued work-up at [facility] -Patient continues to be weak on lower extremities and complaining of pain; continue with pain control with hydrocodone-acetaminophen 1-2 tabs p.o. every 4 as needed for moderate pain -Obtain PT and OT to further evaluate and treat -Question if this is something like polymyalgia rheumatica given that she has proximal muscle weakness ?  -Started the patient on gentle IV fluid hydration and will continue for now  Hypokalemia -Patient is potassium was 3.3 -Replete with 40 mEq of potassium chloride once -Continue to monitor and replete as necessary -Repeat CMP in a.m.  Autoimmune Hepatitis -Diagnosed in 2019 with presentation with elevated LFTs, hx of pancytopenia (Negative BMBx at that time) -was being tapered off of prednisone -continue prednisone 10 mg daily; may need pulse dose steroids as above  Bipolar Disorder Depression - continue aripiprazole, benztropine and escitalopram -Also continue hydroxyzine 25 mg p.o. 3 times daily as needed  Normocytic Anemia -Patient hemoglobin/hematocrit went from  10.2/32.7 on admission is now 9.5/31.8 -check anemia panel in the a.m.  -continue monitor for signs and symptoms of bleeding; currently no overt bleeding noted -Repeat CBC in a.m.  Abnormal LFTs -The setting of autoimmune hepatitis echo-AST was 148 ALT 54 on admission -Repeat not done this morning but will obtain a CMP in a.m. -Continue to monitor and trend hepatic function panel and repeat CMP in a.m.  DVT prophylaxis: Fondaparinux Code Status: FULL CODE  Family Communication: No  family present at bedside  Disposition Plan: Pending further improvement and discussion with specialist.  Patient still remains extremely weak   Consultants:  Neurology  Dr. Andria Frames spoke with Dr. Annabelle Harman at [facility] rheumatology will need to reassess and call them back for further recommendations  Procedures: None   Antimicrobials:  Anti-infectives (From admission, onward)   None     Subjective: Seen and examined at bedside states that she still extremely weak.  No nausea or vomiting.  Wanting to rest.  No other concerns up at this time.  Objective: Vitals:   10/23/19 0701 10/23/19 1336 10/23/19 1526 10/23/19 1750  BP: 126/80 107/67 101/71 113/71  Pulse: 92 (!) 112 (!) 101 (!) 103  Resp: '16 19 19 18  ' Temp: 98.6 F (37 C) 98.5 F (36.9 C) 98.5 F (36.9 C) 98.5 F (36.9 C)  TempSrc: Oral Oral Oral Oral  SpO2: 96% 97% 97% 99%  Weight:      Height:       No intake or output data in the 24 hours ending 10/23/19 1930 Filed Weights   10/21/19 2139  Weight: 63.5 kg   Examination: Physical Exam:  Constitutional: Thin slightly overweight African-American female currently in NAD and a little withdrawn Eyes: Lids and conjunctivae normal, sclerae anicteric  ENMT: External Ears, Nose appear normal. Grossly normal hearing. Neck: Appears normal, supple, no cervical masses, normal ROM, no appreciable thyromegaly, no JVD Respiratory: Diminished to auscultation bilaterally, no wheezing, rales, rhonchi or crackles. Normal respiratory effort and patient is not tachypenic. No accessory muscle use.  Unlabored breathing Cardiovascular: RRR, no murmurs / rubs / gallops. S1 and S2 auscultated.  Abdomen: Soft, non-tender, non-distended. Bowel sounds positive.  GU: Deferred. Musculoskeletal: No clubbing / cyanosis of digits/nails. No joint deformity upper and lower extremities.  Skin: No rashes, lesions, ulcers on limited skin evaluation. No induration; Warm and dry.  Neurologic: CN  2-12 grossly intact with no focal deficits. Has some proximal muscle weakness on testing. Romberg sign cerebellar reflexes not assessed.  Psychiatric: Normal judgment and insight.  Has a slightly depressed appearing and flat affect mood and appropriate affect.   Data Reviewed: I have personally reviewed following labs and imaging studies  CBC: Recent Labs  Lab 10/21/19 2307 10/23/19 0549  WBC 2.4* 2.3*  NEUTROABS 1.5*  --   HGB 10.2* 9.5*  HCT 32.7* 31.3*  MCV 88.9 92.1  PLT 175 357   Basic Metabolic Panel: Recent Labs  Lab 10/21/19 2307 10/23/19 0549  NA 139 144  K 3.6 3.3*  CL 106 114*  CO2 25 24  GLUCOSE 105* 95  BUN 15 12  CREATININE 0.60 0.60  CALCIUM 8.2* 8.0*   GFR: Estimated Creatinine Clearance: 69.6 mL/min (by C-G formula based on SCr of 0.6 mg/dL). Liver Function Tests: Recent Labs  Lab 10/21/19 2307  AST 148*  ALT 54*  ALKPHOS 64  BILITOT 0.9  PROT 6.9  ALBUMIN 2.9*   No results for input(s): LIPASE, AMYLASE in the last 168 hours.  No results for input(s): AMMONIA in the last 168 hours. Coagulation Profile: No results for input(s): INR, PROTIME in the last 168 hours. Cardiac Enzymes: Recent Labs  Lab 10/22/19 1054 10/23/19 0549  CKTOTAL 722* 549*   BNP (last 3 results) No results for input(s): PROBNP in the last 8760 hours. HbA1C: No results for input(s): HGBA1C in the last 72 hours. CBG: No results for input(s): GLUCAP in the last 168 hours. Lipid Profile: No results for input(s): CHOL, HDL, LDLCALC, TRIG, CHOLHDL, LDLDIRECT in the last 72 hours. Thyroid Function Tests: No results for input(s): TSH, T4TOTAL, FREET4, T3FREE, THYROIDAB in the last 72 hours. Anemia Panel: No results for input(s): VITAMINB12, FOLATE, FERRITIN, TIBC, IRON, RETICCTPCT in the last 72 hours. Sepsis Labs: No results for input(s): PROCALCITON, LATICACIDVEN in the last 168 hours.  Recent Results (from the past 240 hour(s))  SARS CORONAVIRUS 2 (TAT 6-24 HRS)  Nasopharyngeal Nasopharyngeal Swab     Status: None   Collection Time: 10/22/19 10:22 AM   Specimen: Nasopharyngeal Swab  Result Value Ref Range Status   SARS Coronavirus 2 NEGATIVE NEGATIVE Final    Comment: (NOTE) SARS-CoV-2 target nucleic acids are NOT DETECTED. The SARS-CoV-2 RNA is generally detectable in upper and lower respiratory specimens during the acute phase of infection. Negative results do not preclude SARS-CoV-2 infection, do not rule out co-infections with other pathogens, and should not be used as the sole basis for treatment or other patient management decisions. Negative results must be combined with clinical observations, patient history, and epidemiological information. The expected result is Negative. Fact Sheet for Patients: SugarRoll.be Fact Sheet for Healthcare Providers: https://www.woods-mathews.com/ This test is not yet approved or cleared by the [place] FDA and  has been authorized for detection and/or diagnosis of SARS-CoV-2 by FDA under an Emergency Use Authorization (EUA). This EUA will remain  in effect (meaning this test can be used) for the duration of the COVID-19 declaration under Section 56 4(b)(1) of the Act, 21 U.S.C. section 360bbb-3(b)(1), unless the authorization is terminated or revoked sooner. Performed at [facility], [place] [address], [place],  [address]      RN Pressure Injury Documentation: Pressure Injury 06/01/18 Stage II -  Partial thickness loss of dermis presenting as a shallow open ulcer with a red, pink wound bed without slough. (Active)  06/01/18 1908  Location: Sacrum  Location Orientation: Mid  Staging: Stage II -  Partial thickness loss of dermis presenting as a shallow open ulcer with a red, pink wound bed without slough.  Wound Description (Comments):   Present on Admission:      Estimated body mass index is 25.61 kg/m as calculated from the following:    Height as of this encounter: '5\' 2"'  (1.575 m).   Weight as of this encounter: 63.5 kg.  Malnutrition Type:      Malnutrition Characteristics:      Nutrition Interventions:    Radiology Studies: MR CERVICAL SPINE WO CONTRAST  Result Date: 10/22/2019 CLINICAL DATA:  Weakness for the past 2 weeks. Fell today. EXAM: MRI CERVICAL, THORACIC AND LUMBAR SPINE WITHOUT CONTRAST TECHNIQUE: Multiplanar and multiecho pulse sequences of the cervical spine, to include the craniocervical junction and cervicothoracic junction, and thoracic and lumbar spine, were obtained without intravenous contrast. COMPARISON:  CTA chest dated August 29, 2019. CT abdomen pelvis dated May 30, 2018. CT neck dated February 09, 2014. FINDINGS: MRI CERVICAL SPINE FINDINGS Alignment: Physiologic. Vertebrae: No fracture, evidence of discitis, or bone lesion. Cord: Normal  signal and morphology. Prominent central canal at C4-C5. Posterior Fossa, vertebral arteries, paraspinal tissues: Negative. Disc levels: No significant disc bulge or herniation. No spinal canal or neuroforaminal stenosis. MRI THORACIC SPINE FINDINGS Alignment:  Physiologic. Vertebrae: No fracture, evidence of discitis, or bone lesion. Cord:  Normal signal and morphology. Paraspinal and other soft tissues: Bibasilar atelectasis. Otherwise negative. Disc levels: No significant disc bulge or herniation. No spinal canal or neuroforaminal stenosis. MRI LUMBAR SPINE FINDINGS Segmentation:  Standard. Alignment:  Physiologic. Vertebrae:  No fracture, evidence of discitis, or bone lesion. Conus medullaris and cauda equina: Conus extends to the L1 level. Conus and cauda equina appear normal. Paraspinal and other soft tissues: Negative. Disc levels: T12-L1 to L4-L5:  Negative. L5-S1: Small shallow broad-based right subarticular and foraminal disc protrusion contacting the descending right S1 nerve root. Additional tiny left foraminal disc protrusion. Mild bilateral facet  arthropathy. No stenosis. IMPRESSION: 1. No acute abnormality. No abnormal cord signal. 2. Small shallow broad-based right subarticular and foraminal disc protrusion at L5-S1 contacting the descending right S1 nerve root. Electronically Signed   By: Titus Dubin M.D.   On: 10/22/2019 14:56   MR THORACIC SPINE WO CONTRAST  Result Date: 10/22/2019 CLINICAL DATA:  Weakness for the past 2 weeks. Fell today. EXAM: MRI CERVICAL, THORACIC AND LUMBAR SPINE WITHOUT CONTRAST TECHNIQUE: Multiplanar and multiecho pulse sequences of the cervical spine, to include the craniocervical junction and cervicothoracic junction, and thoracic and lumbar spine, were obtained without intravenous contrast. COMPARISON:  CTA chest dated August 29, 2019. CT abdomen pelvis dated May 30, 2018. CT neck dated February 09, 2014. FINDINGS: MRI CERVICAL SPINE FINDINGS Alignment: Physiologic. Vertebrae: No fracture, evidence of discitis, or bone lesion. Cord: Normal signal and morphology. Prominent central canal at C4-C5. Posterior Fossa, vertebral arteries, paraspinal tissues: Negative. Disc levels: No significant disc bulge or herniation. No spinal canal or neuroforaminal stenosis. MRI THORACIC SPINE FINDINGS Alignment:  Physiologic. Vertebrae: No fracture, evidence of discitis, or bone lesion. Cord:  Normal signal and morphology. Paraspinal and other soft tissues: Bibasilar atelectasis. Otherwise negative. Disc levels: No significant disc bulge or herniation. No spinal canal or neuroforaminal stenosis. MRI LUMBAR SPINE FINDINGS Segmentation:  Standard. Alignment:  Physiologic. Vertebrae:  No fracture, evidence of discitis, or bone lesion. Conus medullaris and cauda equina: Conus extends to the L1 level. Conus and cauda equina appear normal. Paraspinal and other soft tissues: Negative. Disc levels: T12-L1 to L4-L5:  Negative. L5-S1: Small shallow broad-based right subarticular and foraminal disc protrusion contacting the descending right  S1 nerve root. Additional tiny left foraminal disc protrusion. Mild bilateral facet arthropathy. No stenosis. IMPRESSION: 1. No acute abnormality. No abnormal cord signal. 2. Small shallow broad-based right subarticular and foraminal disc protrusion at L5-S1 contacting the descending right S1 nerve root. Electronically Signed   By: Titus Dubin M.D.   On: 10/22/2019 14:56   MR LUMBAR SPINE WO CONTRAST  Result Date: 10/22/2019 CLINICAL DATA:  Weakness for the past 2 weeks. Fell today. EXAM: MRI CERVICAL, THORACIC AND LUMBAR SPINE WITHOUT CONTRAST TECHNIQUE: Multiplanar and multiecho pulse sequences of the cervical spine, to include the craniocervical junction and cervicothoracic junction, and thoracic and lumbar spine, were obtained without intravenous contrast. COMPARISON:  CTA chest dated August 29, 2019. CT abdomen pelvis dated May 30, 2018. CT neck dated February 09, 2014. FINDINGS: MRI CERVICAL SPINE FINDINGS Alignment: Physiologic. Vertebrae: No fracture, evidence of discitis, or bone lesion. Cord: Normal signal and morphology. Prominent central canal at C4-C5. Posterior Fossa, vertebral arteries,  paraspinal tissues: Negative. Disc levels: No significant disc bulge or herniation. No spinal canal or neuroforaminal stenosis. MRI THORACIC SPINE FINDINGS Alignment:  Physiologic. Vertebrae: No fracture, evidence of discitis, or bone lesion. Cord:  Normal signal and morphology. Paraspinal and other soft tissues: Bibasilar atelectasis. Otherwise negative. Disc levels: No significant disc bulge or herniation. No spinal canal or neuroforaminal stenosis. MRI LUMBAR SPINE FINDINGS Segmentation:  Standard. Alignment:  Physiologic. Vertebrae:  No fracture, evidence of discitis, or bone lesion. Conus medullaris and cauda equina: Conus extends to the L1 level. Conus and cauda equina appear normal. Paraspinal and other soft tissues: Negative. Disc levels: T12-L1 to L4-L5:  Negative. L5-S1: Small shallow broad-based  right subarticular and foraminal disc protrusion contacting the descending right S1 nerve root. Additional tiny left foraminal disc protrusion. Mild bilateral facet arthropathy. No stenosis. IMPRESSION: 1. No acute abnormality. No abnormal cord signal. 2. Small shallow broad-based right subarticular and foraminal disc protrusion at L5-S1 contacting the descending right S1 nerve root. Electronically Signed   By: Titus Dubin M.D.   On: 10/22/2019 14:56   MR UXYBF RIGHT WO CONTRAST  Result Date: 10/22/2019 CLINICAL DATA:  Bilateral leg pain and weakness. Evaluate for myositis. EXAM: MR OF THE LEFT FEMUR WITHOUT CONTRAST; MRI OF THE RIGHT FEMUR WITHOUT CONTRAST TECHNIQUE: Multiplanar, multisequence MR imaging of the bilateral femurs was performed. No intravenous contrast was administered. COMPARISON:  None. FINDINGS: Bones/Joint/Cartilage No marrow signal abnormality. No fracture or dislocation. No joint effusion. Muscles and Tendons Bilateral patchy muscle edema involving the quadriceps, adductor, and hamstring muscles. Superimposed partial tear involving the indirect tendon and inner muscle of the right mid rectus femoris. No muscle atrophy. Soft tissue No fluid collection or hematoma. No soft tissue mass. IMPRESSION: 1. Patchy muscle edema involving the bilateral quadriceps, adductor, and hamstring muscles, consistent with myositis. 2. Superimposed partial tear (grade 2 injury) involving the indirect tendon and inner muscle of the right mid rectus femoris. Electronically Signed   By: Titus Dubin M.D.   On: 10/22/2019 19:18   MR FEMUR LEFT WO CONTRAST  Result Date: 10/22/2019 CLINICAL DATA:  Bilateral leg pain and weakness. Evaluate for myositis. EXAM: MR OF THE LEFT FEMUR WITHOUT CONTRAST; MRI OF THE RIGHT FEMUR WITHOUT CONTRAST TECHNIQUE: Multiplanar, multisequence MR imaging of the bilateral femurs was performed. No intravenous contrast was administered. COMPARISON:  None. FINDINGS:  Bones/Joint/Cartilage No marrow signal abnormality. No fracture or dislocation. No joint effusion. Muscles and Tendons Bilateral patchy muscle edema involving the quadriceps, adductor, and hamstring muscles. Superimposed partial tear involving the indirect tendon and inner muscle of the right mid rectus femoris. No muscle atrophy. Soft tissue No fluid collection or hematoma. No soft tissue mass. IMPRESSION: 1. Patchy muscle edema involving the bilateral quadriceps, adductor, and hamstring muscles, consistent with myositis. 2. Superimposed partial tear (grade 2 injury) involving the indirect tendon and inner muscle of the right mid rectus femoris. Electronically Signed   By: Titus Dubin M.D.   On: 10/22/2019 19:18   Scheduled Meds:  ARIPiprazole  15 mg Oral Daily   benztropine  0.5 mg Oral Daily   fondaparinux (ARIXTRA) injection  2.5 mg Subcutaneous Q24H   predniSONE  10 mg Oral Q breakfast   sodium chloride flush  3 mL Intravenous Q12H   Continuous Infusions:   LOS: 1 day   Kerney Elbe, DO [facility] PAGER is on [place]  If 7PM-7AM, please contact night-coverage [email or web address]

## 2019-10-23 NOTE — Progress Notes (Signed)
OT Cancellation Note  Patient Details Name: Yvonne [name] MRN: [id number] DOB: 28-Sep-1965   Cancelled Treatment:    Reason Eval/Treat Not Completed: Other (comment). Pt currently in the middle of eating her breakfast.  Ignacia Palma, OTR/L Acute Rehab Services Pager [phone] Office [phone]     Evette Georges 10/23/2019, 8:54 AM

## 2019-10-24 DIAGNOSIS — R269 Unspecified abnormalities of gait and mobility: Secondary | ICD-10-CM

## 2019-10-24 LAB — CBC WITH DIFFERENTIAL/PLATELET
Abs Immature Granulocytes: 0 10*3/uL (ref 0.00–0.07)
Basophils Absolute: 0.1 10*3/uL (ref 0.0–0.1)
Basophils Relative: 4 %
Eosinophils Absolute: 0 10*3/uL (ref 0.0–0.5)
Eosinophils Relative: 0 %
HCT: 31.7 % — ABNORMAL LOW (ref 36.0–46.0)
Hemoglobin: 9.4 g/dL — ABNORMAL LOW (ref 12.0–15.0)
Lymphocytes Relative: 12 %
Lymphs Abs: 0.3 10*3/uL — ABNORMAL LOW (ref 0.7–4.0)
MCH: 27.6 pg (ref 26.0–34.0)
MCHC: 29.7 g/dL — ABNORMAL LOW (ref 30.0–36.0)
MCV: 93 fL (ref 80.0–100.0)
Monocytes Absolute: 0.2 10*3/uL (ref 0.1–1.0)
Monocytes Relative: 8 %
Neutro Abs: 1.6 10*3/uL — ABNORMAL LOW (ref 1.7–7.7)
Neutrophils Relative %: 76 %
Platelets: 172 10*3/uL (ref 150–400)
RBC: 3.41 MIL/uL — ABNORMAL LOW (ref 3.87–5.11)
RDW: 16.2 % — ABNORMAL HIGH (ref 11.5–15.5)
WBC: 2.1 10*3/uL — ABNORMAL LOW (ref 4.0–10.5)
nRBC: 2 % — ABNORMAL HIGH (ref 0.0–0.2)

## 2019-10-24 LAB — COMPREHENSIVE METABOLIC PANEL
ALT: 65 U/L — ABNORMAL HIGH (ref 0–44)
AST: 144 U/L — ABNORMAL HIGH (ref 15–41)
Albumin: 2.4 g/dL — ABNORMAL LOW (ref 3.5–5.0)
Alkaline Phosphatase: 66 U/L (ref 38–126)
Anion gap: 7 (ref 5–15)
BUN: 13 mg/dL (ref 6–20)
CO2: 22 mmol/L (ref 22–32)
Calcium: 8.1 mg/dL — ABNORMAL LOW (ref 8.9–10.3)
Chloride: 118 mmol/L — ABNORMAL HIGH (ref 98–111)
Creatinine, Ser: 0.55 mg/dL (ref 0.44–1.00)
GFR calc Af Amer: >60 mL/min (ref >60)
GFR calc non Af Amer: >60 mL/min (ref >60)
Glucose, Bld: 84 mg/dL (ref 70–99)
Potassium: 3.9 mmol/L (ref 3.5–5.1)
Sodium: 147 mmol/L — ABNORMAL HIGH (ref 135–145)
Total Bilirubin: 0.6 mg/dL (ref 0.3–1.2)
Total Protein: 5.9 g/dL — ABNORMAL LOW (ref 6.5–8.1)

## 2019-10-24 LAB — MAGNESIUM: Magnesium: 2.1 mg/dL (ref 1.7–2.4)

## 2019-10-24 LAB — PHOSPHORUS: Phosphorus: 3 mg/dL (ref 2.5–4.6)

## 2019-10-24 MED ORDER — SODIUM CHLORIDE 0.9% FLUSH: 10.0000 mL | INTRAVENOUS | Status: DC | PRN

## 2019-10-24 MED ORDER — FONDAPARINUX SODIUM 2.5 MG/0.5ML ~~LOC~~ SOLN: 2.5000 mg | SUBCUTANEOUS | Status: AC

## 2019-10-24 MED ORDER — ACETAMINOPHEN 325 MG PO TABS: 650.0000 mg | ORAL_TABLET | Freq: Four times a day (QID) | ORAL | Status: AC | PRN

## 2019-10-24 NOTE — TOC Initial Note (Signed)
Transition of Care [facility]) - Initial/Assessment Note    Patient Details  Name: Yvonne [name] MRN: [id number] Date of Birth: March 13, 1966  Transition of Care [facility]) CM/SW Contact:    Shelbie Ammons, RN Phone Number: 10/24/2019, 11:03 AM  Clinical Narrative:      RNCM assessed patient at bedside, patient completing bath on arrival to room. Patient is alert and verbally responsive and is agreeable to speak with this CM after role was explained. Patient reports that she resides at [facility] and has been there about 2 years. Patient reports that she believes they will be able to provide her assistance when she returns but offers that the person the runs the home is Earlean Polka and that I should talk to her. Discussed that therapy is recommending that she may need a higher level of care if her group home cannot provide assistance and patient reports that she would be agreeable to go to a SNF if that is what she needs and reports she was at [facility] about 7-8 months ago. Patient reports her sister Onalee Hua provides transportation.   RNCM contacted Earlean Polka group home lead at [phone], she reported that if patient was still requiring 24/7 assistance it may be more beneficial that she go to short term rehab to regain some of her strength as her staffing ratio is one staff member to 6 residents.   Patient may transferred to tertiary care center pending medical necessity, RNCM will continue to follow.                 Expected Discharge Plan: Group Home Barriers to Discharge: Continued Medical Work up   Patient Goals and CMS Choice        Expected Discharge Plan and Services Expected Discharge Plan: Group Home   Discharge Planning Services: CM Consult   Living arrangements for the past 2 months: Group Home                                      Prior Living Arrangements/Services Living arrangements for the past 2 months: Group Home Lives with:: Facility  Resident Patient language and need for interpreter reviewed:: Yes Do you feel safe going back to the place where you live?: Yes      Need for Family Participation in Patient Care: Yes (Comment) Care giver support system in place?: Yes (comment) Current home services: Housekeeping Criminal Activity/Legal Involvement Pertinent to Current Situation/Hospitalization: No - Comment as needed  Activities of Daily Living Home Assistive Devices/Equipment: Cane (specify quad or straight)(single point cane) ADL Screening (condition at time of admission) Patient's cognitive ability adequate to safely complete daily activities?: Yes Is the patient deaf or have difficulty hearing?: No Does the patient have difficulty seeing, even when wearing glasses/contacts?: No Does the patient have difficulty concentrating, remembering, or making decisions?: Yes Patient able to express need for assistance with ADLs?: Yes Does the patient have difficulty dressing or bathing?: No Independently performs ADLs?: Yes (appropriate for developmental age)(uses a cane for balance) Does the patient have difficulty walking or climbing stairs?: Yes Weakness of Legs: Both Weakness of Arms/Hands: None  Permission Sought/Granted Permission sought to share information with : Family Supports, Customer service manager    Share Information with NAME: Kenn File (sister) and Earlean Polka (caregiver)           Emotional Assessment Appearance:: Appears older than stated age Attitude/Demeanor/Rapport: Apprehensive Affect (typically  observed): Calm, Flat Orientation: : Oriented to Self, Oriented to Place, Oriented to  Time, Oriented to Situation Alcohol / Substance Use: Not Applicable Psych Involvement: No (comment)  Admission diagnosis:  Myalgia [M79.10] Weakness [R53.1] Abnormality of gait and mobility [R26.9] Patient Active Problem List   Diagnosis Date Noted  . Weakness 10/22/2019  . Pressure injury of skin  06/13/2018  . Abnormal LFTs (liver function tests)   . Pancytopenia (HCC)   . Other pancytopenia (HCC) 06/02/2018  . Orthostatic hypotension 05/30/2018  . Severe protein-calorie malnutrition (HCC) 05/30/2018  . Hypocalcemia 05/30/2018  . Transaminitis 05/30/2018  . Fever 05/30/2018  . Thrombocytopenia (HCC) 05/30/2018  . Severe bipolar I disorder, most recent episode depressed (HCC) 06/01/2017  . Bipolar I disorder, current or most recent episode manic, with psychotic features (HCC)   . Affective psychosis, bipolar (HCC) 01/05/2017  . Manic behavior (HCC)    PCP:  [facility] Pharmacy:  No Pharmacies Listed    Social Determinants of Health (SDOH) Interventions    Readmission Risk Interventions No flowsheet data found.

## 2019-10-24 NOTE — Discharge Summary (Signed)
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 Pt reported 2-3 weeks of pain, unable to get out of bed.  Group home caretaker reports she is in bed mostly, few weeks prior she could ambulate with assistance.  However, in past two weeks she has been having pain, initially in arms and then subsequently in her lower extremities.  She reports she subsequently had pain in her legs resulting in weakness.  Caregiver reports, she and another roommate need to stand her up.  She is unable to sit up or stand up on her own.  Per the caregiver, once she stands up she is able to walk with her walker in the past week.  However in the past 24 hours she has been less mobile.  However, with EMS she was able to walk with her walker. Patient is not the most reliable historian - she reported to me she has not been able to get up or walk for the past two weeks, she reports pain in her bilateral legs, beginning more proximally and moving down. - Med list per caregiver -  Prednisone 10 mg daily (previously every other day, change made on April 1) - seen at [facility] Course:  Yvonne [name] is a 54 y.o. 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   Myositis Acute / subacute progressive proximal muscle weakness H/o  SLE vs MCTD - Hx of Positive ANA, ASMA, DS-DNA, ENA, Chromatin, SS-A and SS-B Ab and RNA Ab - previously on plaquenil and higher doses of steroids was being tapered off by GI for AI Hepatitis (pieced together from records) and now with progressive proximal muscle weakness in the past 2-3 weeks, worsened in the past two days.   -MRI LS spine unremarkable -MRI femur noted Bilateral patchy muscle edema involving the quadriceps, adductor, and hamstring muscles concerning for Myositis, CK was 722 -Neurology consulted, needs Rheum evaluation for Muscle biopsy, ? 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) Code Status: Full Consults called: Rheumatology - Transfer line   Discharge Exam: Vitals:   10/24/19 0555 10/24/19 1334  BP: 106/77 110/75  Pulse: 89 (!) 107  Resp: 16 16  Temp: 98.2 F (36.8 C) 98.9 F (37.2 C)  SpO2: 99% 95%    General: AAOx3 Cardiovascular: S1S2/RRR Respiratory: CTAB  Discharge Instructions    Allergies as of 10/24/2019      Reactions   Heparin    HITT      Medication List    STOP taking these medications   predniSONE 20 MG tablet Commonly known as: DELTASONE     TAKE these medications    acetaminophen 325 MG tablet Commonly known as: TYLENOL Take 2 tablets (650 mg total) by mouth every 6 (six) hours as needed for mild pain (or Fever >/= 101).   ARIPiprazole 15 MG tablet Commonly known as: ABILIFY Take 1 tablet (15 mg total) by mouth daily. For mood control   benztropine 0.5 MG tablet Commonly known as: COGENTIN Take 1 tablet (0.5 mg total) by mouth daily. For prevention of drug induced tremors   fondaparinux 2.5 MG/0.5ML Soln injection Commonly known as: ARIXTRA Inject 0.5 mLs (2.5 mg total) into the skin daily. Start taking on: October 25, 2019   hydroxychloroquine 200 MG tablet Commonly known as: PLAQUENIL Take 1 tablet (200 mg total) by mouth daily.   hydrOXYzine 25 MG tablet Commonly known as: ATARAX/VISTARIL Take 25 mg by mouth 3 (three) times daily as needed for anxiety.      Allergies  Allergen Reactions  . Heparin     HITT      The results of significant diagnostics from this hospitalization (including imaging, microbiology, ancillary and laboratory) are listed below for reference.    Significant Diagnostic Studies: MR CERVICAL SPINE WO CONTRAST  Result Date: 10/22/2019 CLINICAL DATA:  Weakness for the past 2 weeks. Fell today. EXAM: MRI CERVICAL, THORACIC AND LUMBAR SPINE WITHOUT CONTRAST TECHNIQUE: Multiplanar and multiecho pulse sequences of the cervical spine, to include the craniocervical junction and cervicothoracic junction, and thoracic and lumbar spine, were obtained without intravenous contrast. COMPARISON:  CTA chest dated August 29, 2019. CT abdomen pelvis dated May 30, 2018. CT neck dated February 09, 2014. FINDINGS: MRI CERVICAL SPINE FINDINGS Alignment: Physiologic. Vertebrae: No fracture, evidence of discitis, or bone lesion. Cord: Normal signal and morphology. Prominent central canal at C4-C5. Posterior Fossa, vertebral arteries, paraspinal tissues: Negative. Disc levels: No significant disc bulge or herniation. No spinal canal or  neuroforaminal stenosis. MRI THORACIC SPINE FINDINGS Alignment:  Physiologic. Vertebrae: No fracture, evidence of discitis, or bone lesion. Cord:  Normal signal and morphology. Paraspinal and other soft tissues: Bibasilar atelectasis. Otherwise negative. Disc levels: No significant disc bulge or herniation. No spinal canal or neuroforaminal stenosis. MRI LUMBAR SPINE FINDINGS Segmentation:  Standard. Alignment:  Physiologic. Vertebrae:  No fracture, evidence of discitis, or bone lesion. Conus medullaris and cauda equina: Conus extends to the L1 level. Conus and cauda equina appear normal. Paraspinal and other soft tissues: Negative. Disc levels: T12-L1 to L4-L5:  Negative. L5-S1: Small shallow broad-based right subarticular and foraminal disc protrusion contacting the descending right S1 nerve root. Additional tiny left foraminal disc protrusion. Mild bilateral facet arthropathy. No stenosis. IMPRESSION: 1. No acute abnormality. No abnormal cord signal. 2. Small shallow broad-based right subarticular and foraminal disc protrusion at L5-S1 contacting the descending right S1 nerve root. Electronically Signed   By: Obie DredgeWilliam T Derry M.D.   On: 10/22/2019 14:56   MR THORACIC SPINE WO CONTRAST  Result Date: 10/22/2019 CLINICAL DATA:  Weakness for the past 2 weeks. Fell today. EXAM: MRI CERVICAL, THORACIC AND LUMBAR SPINE WITHOUT CONTRAST TECHNIQUE: Multiplanar and multiecho pulse sequences  of the cervical spine, to include the craniocervical junction and cervicothoracic junction, and thoracic and lumbar spine, were obtained without intravenous contrast. COMPARISON:  CTA chest dated August 29, 2019. CT abdomen pelvis dated May 30, 2018. CT neck dated February 09, 2014. FINDINGS: MRI CERVICAL SPINE FINDINGS Alignment: Physiologic. Vertebrae: No fracture, evidence of discitis, or bone lesion. Cord: Normal signal and morphology. Prominent central canal at C4-C5. Posterior Fossa, vertebral arteries, paraspinal tissues:  Negative. Disc levels: No significant disc bulge or herniation. No spinal canal or neuroforaminal stenosis. MRI THORACIC SPINE FINDINGS Alignment:  Physiologic. Vertebrae: No fracture, evidence of discitis, or bone lesion. Cord:  Normal signal and morphology. Paraspinal and other soft tissues: Bibasilar atelectasis. Otherwise negative. Disc levels: No significant disc bulge or herniation. No spinal canal or neuroforaminal stenosis. MRI LUMBAR SPINE FINDINGS Segmentation:  Standard. Alignment:  Physiologic. Vertebrae:  No fracture, evidence of discitis, or bone lesion. Conus medullaris and cauda equina: Conus extends to the L1 level. Conus and cauda equina appear normal. Paraspinal and other soft tissues: Negative. Disc levels: T12-L1 to L4-L5:  Negative. L5-S1: Small shallow broad-based right subarticular and foraminal disc protrusion contacting the descending right S1 nerve root. Additional tiny left foraminal disc protrusion. Mild bilateral facet arthropathy. No stenosis. IMPRESSION: 1. No acute abnormality. No abnormal cord signal. 2. Small shallow broad-based right subarticular and foraminal disc protrusion at L5-S1 contacting the descending right S1 nerve root. Electronically Signed   By: Obie Dredge M.D.   On: 10/22/2019 14:56   MR LUMBAR SPINE WO CONTRAST  Result Date: 10/22/2019 CLINICAL DATA:  Weakness for the past 2 weeks. Fell today. EXAM: MRI CERVICAL, THORACIC AND LUMBAR SPINE WITHOUT CONTRAST TECHNIQUE: Multiplanar and multiecho pulse sequences of the cervical spine, to include the craniocervical junction and cervicothoracic junction, and thoracic and lumbar spine, were obtained without intravenous contrast. COMPARISON:  CTA chest dated August 29, 2019. CT abdomen pelvis dated May 30, 2018. CT neck dated February 09, 2014. FINDINGS: MRI CERVICAL SPINE FINDINGS Alignment: Physiologic. Vertebrae: No fracture, evidence of discitis, or bone lesion. Cord: Normal signal and morphology. Prominent  central canal at C4-C5. Posterior Fossa, vertebral arteries, paraspinal tissues: Negative. Disc levels: No significant disc bulge or herniation. No spinal canal or neuroforaminal stenosis. MRI THORACIC SPINE FINDINGS Alignment:  Physiologic. Vertebrae: No fracture, evidence of discitis, or bone lesion. Cord:  Normal signal and morphology. Paraspinal and other soft tissues: Bibasilar atelectasis. Otherwise negative. Disc levels: No significant disc bulge or herniation. No spinal canal or neuroforaminal stenosis. MRI LUMBAR SPINE FINDINGS Segmentation:  Standard. Alignment:  Physiologic. Vertebrae:  No fracture, evidence of discitis, or bone lesion. Conus medullaris and cauda equina: Conus extends to the L1 level. Conus and cauda equina appear normal. Paraspinal and other soft tissues: Negative. Disc levels: T12-L1 to L4-L5:  Negative. L5-S1: Small shallow broad-based right subarticular and foraminal disc protrusion contacting the descending right S1 nerve root. Additional tiny left foraminal disc protrusion. Mild bilateral facet arthropathy. No stenosis. IMPRESSION: 1. No acute abnormality. No abnormal cord signal. 2. Small shallow broad-based right subarticular and foraminal disc protrusion at L5-S1 contacting the descending right S1 nerve root. Electronically Signed   By: Obie Dredge M.D.   On: 10/22/2019 14:56   MR HYWVP RIGHT WO CONTRAST  Result Date: 10/22/2019 CLINICAL DATA:  Bilateral leg pain and weakness. Evaluate for myositis. EXAM: MR OF THE LEFT FEMUR WITHOUT CONTRAST; MRI OF THE RIGHT FEMUR WITHOUT CONTRAST TECHNIQUE: Multiplanar, multisequence MR imaging of the bilateral femurs was performed. No  intravenous contrast was administered. COMPARISON:  None. FINDINGS: Bones/Joint/Cartilage No marrow signal abnormality. No fracture or dislocation. No joint effusion. Muscles and Tendons Bilateral patchy muscle edema involving the quadriceps, adductor, and hamstring muscles. Superimposed partial tear  involving the indirect tendon and inner muscle of the right mid rectus femoris. No muscle atrophy. Soft tissue No fluid collection or hematoma. No soft tissue mass. IMPRESSION: 1. Patchy muscle edema involving the bilateral quadriceps, adductor, and hamstring muscles, consistent with myositis. 2. Superimposed partial tear (grade 2 injury) involving the indirect tendon and inner muscle of the right mid rectus femoris. Electronically Signed   By: Obie Dredge M.D.   On: 10/22/2019 19:18   MR FEMUR LEFT WO CONTRAST  Result Date: 10/22/2019 CLINICAL DATA:  Bilateral leg pain and weakness. Evaluate for myositis. EXAM: MR OF THE LEFT FEMUR WITHOUT CONTRAST; MRI OF THE RIGHT FEMUR WITHOUT CONTRAST TECHNIQUE: Multiplanar, multisequence MR imaging of the bilateral femurs was performed. No intravenous contrast was administered. COMPARISON:  None. FINDINGS: Bones/Joint/Cartilage No marrow signal abnormality. No fracture or dislocation. No joint effusion. Muscles and Tendons Bilateral patchy muscle edema involving the quadriceps, adductor, and hamstring muscles. Superimposed partial tear involving the indirect tendon and inner muscle of the right mid rectus femoris. No muscle atrophy. Soft tissue No fluid collection or hematoma. No soft tissue mass. IMPRESSION: 1. Patchy muscle edema involving the bilateral quadriceps, adductor, and hamstring muscles, consistent with myositis. 2. Superimposed partial tear (grade 2 injury) involving the indirect tendon and inner muscle of the right mid rectus femoris. Electronically Signed   By: Obie Dredge M.D.   On: 10/22/2019 19:18    Microbiology: Recent Results (from the past 240 hour(s))  SARS CORONAVIRUS 2 (TAT 6-24 HRS) Nasopharyngeal Nasopharyngeal Swab     Status: None   Collection Time: 10/22/19 10:22 AM   Specimen: Nasopharyngeal Swab  Result Value Ref Range Status   SARS Coronavirus 2 NEGATIVE NEGATIVE Final    Comment: (NOTE) SARS-CoV-2 target nucleic acids  are NOT DETECTED. The SARS-CoV-2 RNA is generally detectable in upper and lower respiratory specimens during the acute phase of infection. Negative results do not preclude SARS-CoV-2 infection, do not rule out co-infections with other pathogens, and should not be used as the sole basis for treatment or other patient management decisions. Negative results must be combined with clinical observations, patient history, and epidemiological information. The expected result is Negative. Fact Sheet for Patients: HairSlick.no Fact Sheet for Healthcare Providers: quierodirigir.com This test is not yet approved or cleared by the [place] FDA and  has been authorized for detection and/or diagnosis of SARS-CoV-2 by FDA under an Emergency Use Authorization (EUA). This EUA will remain  in effect (meaning this test can be used) for the duration of the COVID-19 declaration under Section 56 4(b)(1) of the Act, 21 U.S.C. section 360bbb-3(b)(1), unless the authorization is terminated or revoked sooner. Performed at [facility], [address] [place], [place] [address]      Labs: Basic Metabolic Panel: Recent Labs  Lab 10/21/19 2307 10/23/19 0549 10/24/19 0539  NA 139 144 147*  K 3.6 3.3* 3.9  CL 106 114* 118*  CO2 25 24 22   GLUCOSE 105* 95 84  BUN 15 12 13   CREATININE 0.60 0.60 0.55  CALCIUM 8.2* 8.0* 8.1*  MG  --   --  2.1  PHOS  --   --  3.0   Liver Function Tests: Recent Labs  Lab 10/21/19 2307 10/24/19 0539  AST 148* 144*  ALT 54* 65*  ALKPHOS 64 66  BILITOT 0.9 0.6  PROT 6.9 5.9*  ALBUMIN 2.9* 2.4*   No results for input(s): LIPASE, AMYLASE in the last 168 hours. No results for input(s): AMMONIA in the last 168 hours. CBC: Recent Labs  Lab 10/21/19 2307 10/23/19 0549 10/24/19 0539  WBC 2.4* 2.3* 2.1*  NEUTROABS 1.5*  --  1.6*  HGB 10.2* 9.5* 9.4*  HCT 32.7* 31.3* 31.7*  MCV 88.9 92.1 93.0  PLT 175  160 172   Cardiac Enzymes: Recent Labs  Lab 10/22/19 1054 10/23/19 0549  CKTOTAL 722* 549*   BNP: BNP (last 3 results) No results for input(s): BNP in the last 8760 hours.  ProBNP (last 3 results) No results for input(s): PROBNP in the last 8760 hours.  CBG: No results for input(s): GLUCAP in the last 168 hours.   Signed:  Zannie Cove MD.  [facility] 10/24/2019, 5:28 PM

## 2019-10-24 NOTE — Progress Notes (Signed)
Physical Therapy Treatment Patient Details Name: Yvonne [name] MRN: [id number] DOB: Jul 20, 1965 Today's Date: 10/24/2019    History of Present Illness 54 y.o. 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    PT Comments    Pt requests BSC upon arrival to void bladder, requiring mod assist to come to EOB for BLE movement and to upright trunk. Pt completed bed<>BSC transfer and able to complete pericare independently. Pt with c/o dizziness upon standing from [facility] that resolves with sitting rest. Cues for hand placement and sequencing with transfer training to ensure safety with IV line. Pt opts to sit in chair and perform exercises and declines ambulation at this time. Pt tolerates seated exercise, limited due to R quad grade 2 injury and requiring verbal cues for glute activation. Pt denies pain at EOS, but does report R quad pain intermittently with mobility. Patient will benefit from continued physical therapy in hospital and recommended venue below to increase strength, balance, endurance for safe ADLs and gait.    Follow Up Recommendations  Home health PT;Supervision for mobility/OOB     Equipment Recommendations  Rolling walker with 5" wheels    Recommendations for Other Services       Precautions / Restrictions Precautions Precautions: Fall Precaution Comments: pt reports she fell 2 weeks ago Restrictions Weight Bearing Restrictions: No    Mobility  Bed Mobility Overal bed mobility: Needs Assistance Bed Mobility: Supine to Sit   Sidelying to sit: Mod assist       General bed mobility comments: mod assist to bring BLE to EOB and to upright trunk  Transfers Overall transfer level: Needs assistance Equipment used: Rolling walker (2 wheeled) Transfers: Sit to/from [facility] Sit to Stand: Min assist Stand pivot  transfers: Min assist       General transfer comment: cues for hand placement, min assist to power up and cues for sequencing with pivot  Ambulation/Gait             General Gait Details: steps at bedside with RW, declines further ambulation due to dizziness complaints   Stairs             Wheelchair Mobility    Modified Rankin (Stroke Patients Only)       Balance Overall balance assessment: History of Falls;Needs assistance Sitting-balance support: No upper extremity supported;Feet supported Sitting balance-Leahy Scale: Good Sitting balance - Comments: seated EOB   Standing balance support: During functional activity;Bilateral upper extremity supported Standing balance-Leahy Scale: Fair Standing balance comment: with RW                            Cognition Arousal/Alertness: Awake/alert Behavior During Therapy: Flat affect Overall Cognitive Status: Within Functional Limits for tasks assessed                 Exercises General Exercises - Lower Extremity Gluteal Sets: Seated;Both;10 reps(3 sec hold) Long Arc Quad: Seated;Left;5 reps Hip Flexion/Marching: Seated;Left;5 reps Toe Raises: Seated;Both;20 reps Heel Raises: Seated;Both;20 reps    General Comments        Pertinent Vitals/Pain Pain Assessment: Faces Faces Pain Scale: Hurts even more Pain Location: R thigh with some mobility Pain Descriptors / Indicators: Discomfort;Grimacing;Guarding Pain Intervention(s): Limited activity within patient's tolerance;Monitored during session;Repositioned    Home Living  Prior Function            PT Goals (current goals can now be found in the care plan section) Acute Rehab PT Goals Patient Stated Goal: be able to walk PT Goal Formulation: With patient Time For Goal Achievement: 11/05/19 Potential to Achieve Goals: Good Progress towards PT goals: Progressing toward goals    Frequency    Min 3X/week      PT Plan  Current plan remains appropriate    Co-evaluation              AM-PAC PT "6 Clicks" Mobility   Outcome Measure  Help needed turning from your back to your side while in a flat bed without using bedrails?: A Little Help needed moving from lying on your back to sitting on the side of a flat bed without using bedrails?: A Lot Help needed moving to and from a bed to a chair (including a wheelchair)?: A Little Help needed standing up from a chair using your arms (e.g., wheelchair or bedside chair)?: A Little Help needed to walk in hospital room?: A Little Help needed climbing 3-5 steps with a railing? : A Lot 6 Click Score: 16    End of Session   Activity Tolerance: Patient tolerated treatment well Patient left: in chair;with call bell/phone within reach;with chair alarm set Nurse Communication: Mobility status PT Visit Diagnosis: Difficulty in walking, not elsewhere classified (R26.2);Pain Pain - Right/Left: Right Pain - part of body: Leg     Time: 1610-9604 PT Time Calculation (min) (ACUTE ONLY): 27 min  Charges:  $Therapeutic Exercise: 8-22 mins $Therapeutic Activity: 8-22 mins                     Tori Keidy Thurgood PT, DPT 10/24/19, 2:09 PM [phone]

## 2019-10-25 NOTE — Progress Notes (Signed)
Patient seen and examined, no changes, awaiting discharge to [facility] was accepted in transfer yesterday  Zannie Cove, MD

## 2019-10-25 NOTE — Progress Notes (Signed)
Occupational Therapy Treatment Patient Details Name: Yvonne [name] MRN: [id number] DOB: 1965-11-23 Today's Date: 10/25/2019    History of present illness 54 y.o. 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   OT comments  Pt progressing towards acute OT goals. Focus of session was toilet transfer and pericare, building activity tolerance towards LB ADL and OOB ADL goals, and initiating a HEP program for BUE strengthening. Pt verbalized fear of falling but ultimately needed only min steadying assist utilizing her cane. D/c plan remains appropriate.    Follow Up Recommendations  Home health OT;Supervision/Assistance - 24 hour    Equipment Recommendations  3 in 1 bedside commode;Tub/shower seat    Recommendations for Other Services      Precautions / Restrictions Precautions Precautions: Fall Precaution Comments: h/o fall Restrictions Weight Bearing Restrictions: No       Mobility Bed Mobility Overal bed mobility: Needs Assistance Bed Mobility: Supine to Sit Rolling: Min assist Sidelying to sit: Mod assist       General bed mobility comments: Pt able to advance BLE across bed with extra time and effort. Assist to powerup trunk and pivot hips to full EOB position  Transfers Overall transfer level: Needs assistance Equipment used: Straight cane Transfers: Sit to/from [employer] Sit to Stand: Min assist Stand pivot transfers: Min assist       General transfer comment: to/from EOB and recliner. assist to steady. Reassurance provided as pt verbalized fear of falling.     Balance Overall balance assessment: History of Falls;Needs assistance Sitting-balance support: No upper extremity supported;Feet supported Sitting balance-Leahy Scale: Good     Standing balance support: During functional activity;Bilateral  upper extremity supported Standing balance-Leahy Scale: Fair Standing balance comment: cane                           ADL either performed or assessed with clinical judgement   ADL Overall ADL's : Needs assistance/impaired                         Toilet Transfer: Minimal assistance;Ambulation;RW;Regular Toilet;Grab bars Toilet Transfer Details (indicate cue type and reason): utilized BSC due to pt's report of urgency to void. ultimately completed bathroom distance walking during session Toileting- Clothing Manipulation and Hygiene: Minimal assistance;Sit to/from stand Toileting - Clothing Manipulation Details (indicate cue type and reason): assist for throughness in stamding position       General ADL Comments: Pt completed bed mobility, walked in place and took pivotal steps to recliner. Pt then completed 2 more sets of standing and walking in place for <1 minute with seated rest breaks utilized. Pt verbalized fear of falling in standing position.      Vision       Perception     Praxis      Cognition Arousal/Alertness: Awake/alert Behavior During Therapy: Flat affect Overall Cognitive Status: No family/caregiver present to determine baseline cognitive functioning                                 General Comments: decreased problem solving, some slow processing        Exercises Exercises: Other exercises Other Exercises Other Exercises: provided level 1 theraband and hand squeeze ball and instructed in use. Cues for hand  placement and technique with pt giving return demos and completed about 10 reps of general UB strengthening exercise at waist level.    Shoulder Instructions       General Comments      Pertinent Vitals/ Pain       Pain Assessment: Faces Faces Pain Scale: Hurts even more Pain Location: R thigh with some mobility Pain Descriptors / Indicators: Discomfort;Grimacing;Guarding Pain Intervention(s): Monitored during  session;Repositioned;Limited activity within patient's tolerance  Home Living                                          Prior Functioning/Environment              Frequency  Min 2X/week        Progress Toward Goals  OT Goals(current goals can now be found in the care plan section)  Progress towards OT goals: Progressing toward goals  Acute Rehab OT Goals Patient Stated Goal: be able to walk OT Goal Formulation: With patient Time For Goal Achievement: 11/06/19 Potential to Achieve Goals: Good ADL Goals Pt Will Perform Grooming: with modified independence;standing Pt Will Perform Upper Body Bathing: Independently;sitting Pt Will Perform Lower Body Bathing: with modified independence;sit to/from stand Pt Will Perform Upper Body Dressing: with modified independence;sitting Pt Will Perform Lower Body Dressing: with modified independence;sit to/from stand Pt Will Transfer to Toilet: with modified independence;ambulating;bedside commode Pt Will Perform Toileting - Clothing Manipulation and hygiene: with modified independence;sit to/from stand Pt Will Perform Tub/Shower Transfer: Tub transfer;with modified independence;rolling walker;shower seat Pt/caregiver will Perform Home Exercise Program: Increased ROM;Increased strength;Both right and left upper extremity;Independently;With written HEP provided  Plan Discharge plan remains appropriate    Co-evaluation                 AM-PAC OT "6 Clicks" Daily Activity     Outcome Measure   Help from another person eating meals?: None Help from another person taking care of personal grooming?: A Little Help from another person toileting, which includes using toliet, bedpan, or urinal?: A Little Help from another person bathing (including washing, rinsing, drying)?: A Little Help from another person to put on and taking off regular upper body clothing?: A Little Help from another person to put on and taking off  regular lower body clothing?: A Little 6 Click Score: 19    End of Session Equipment Utilized During Treatment: Gait belt;Other (comment)(cane)  OT Visit Diagnosis: Unsteadiness on feet (R26.81);Muscle weakness (generalized) (M62.81);Pain Pain - Right/Left: Right   Activity Tolerance Patient tolerated treatment well   Patient Left in chair;with call bell/phone within reach;with chair alarm set   Nurse Communication          Time: 9379-0240 OT Time Calculation (min): 30 min  Charges: OT General Charges $OT Visit: 1 Visit OT Treatments $Self Care/Home Management : 8-22 mins $Therapeutic Exercise: 8-22 mins  Tyrone Schimke, OT Acute Rehabilitation Services Pager: [phone] Office: [phone]    Hortencia Pilar 10/25/2019, 1:01 PM

## 2019-10-26 NOTE — Progress Notes (Signed)
Called and gave report to [place], Charity fundraiser at [facility] [phone]. Patient is going to unit [facility] [employer] [phone].

## 2019-10-26 NOTE — Progress Notes (Signed)
Dispatch called [phone] for [facility] and transportation requested.

## 2019-10-31 MED ORDER — ARIPIPRAZOLE 15 MG PO TABS
15.00 | ORAL_TABLET | ORAL | Status: DC
Start: 2019-10-31 — End: 2019-10-31

## 2019-10-31 MED ORDER — BENZTROPINE MESYLATE 0.5 MG PO TABS
0.50 | ORAL_TABLET | ORAL | Status: DC
Start: 2019-10-31 — End: 2019-10-31

## 2019-10-31 MED ORDER — PREDNISONE 10 MG PO TABS
40.00 | ORAL_TABLET | ORAL | Status: DC
Start: 2019-10-31 — End: 2019-10-31

## 2019-10-31 MED ORDER — TABLOID PO
2.50 | ORAL | Status: DC
Start: 2019-10-31 — End: 2019-10-31

## 2019-10-31 MED ORDER — HYDROXYCHLOROQUINE SULFATE 200 MG PO TABS
200.00 | ORAL_TABLET | ORAL | Status: DC
Start: 2019-10-31 — End: 2019-10-31

## 2019-10-31 MED ORDER — ESCITALOPRAM OXALATE 5 MG PO TABS
5.00 | ORAL_TABLET | ORAL | Status: DC
Start: 2019-10-31 — End: 2019-10-31

## 2019-10-31 MED ORDER — POLYETHYLENE GLYCOL 3350 17 GM/SCOOP PO POWD
17.00 | ORAL | Status: DC
Start: ? — End: 2019-10-31

## 2019-10-31 MED ORDER — ACETAMINOPHEN 325 MG PO TABS
650.00 | ORAL_TABLET | ORAL | Status: DC
Start: ? — End: 2019-10-31

## 2019-10-31 MED ORDER — MELATONIN 3 MG PO TABS
3.00 | ORAL_TABLET | ORAL | Status: DC
Start: ? — End: 2019-10-31

## 2024-08-09 ENCOUNTER — Other Ambulatory Visit (INDEPENDENT_AMBULATORY_CARE_PROVIDER_SITE_OTHER): Payer: Self-pay

## 2024-08-09 ENCOUNTER — Ambulatory Visit: Admitting: Orthopaedic Surgery

## 2024-08-09 DIAGNOSIS — M87052 Idiopathic aseptic necrosis of left femur: Secondary | ICD-10-CM

## 2024-08-09 DIAGNOSIS — M87051 Idiopathic aseptic necrosis of right femur: Secondary | ICD-10-CM

## 2024-08-09 NOTE — Progress Notes (Signed)
 "  Office Visit Note   Patient: Yvonne [name]           Date of Birth: 12/02/1965           MRN: [id number] Visit Date: 08/09/2024              Requested by: Clinic, Yvonne [name] [address] [facility] [place],  [place] [address] PCP: Clinic, Yvonne [name]   Assessment & Plan: Visit Diagnoses:  1. Avascular necrosis of bone of right hip (HCC)   2. 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  Based on these findings her best option is to undergo total hip arthroplasty.  However sounds like her social support is lacking and we talked about the pros and cons of going home versus a SNF postoperatively.  She will try to coordinate with her family to see if she can get enough help after surgery so that she can go home with home health services.  In the meantime we will check a prealbumin to assess nutritional status.  Her bipolar disorder is well controlled.  Follow-Up Instructions: Return if symptoms worsen or fail to improve.   Orders:  Orders Placed This Encounter  Procedures   XR HIPS BILAT W OR W/O PELVIS 3-4 VIEWS   Prealbumin   No orders of the defined types were placed in this encounter.     Procedures: No procedures performed   Clinical Data: No additional findings.   Subjective: Chief Complaint  Patient presents with   Right Hip - Pain   Left Hip - Pain    HPI Patient is a 59 year old female referral from the Yvonne [name] for bilateral hip avascular necrosis.  Her sister is with her.  She uses a cane for ambulation.  She has constant groin pain that is severe. Review of Systems  Constitutional: Negative.   HENT: Negative.    Eyes: Negative.   Respiratory: Negative.    Cardiovascular: Negative.   Endocrine: Negative.   Musculoskeletal: Negative.   Neurological: Negative.   Hematological: Negative.   Psychiatric/Behavioral: Negative.    All other systems  reviewed and are negative.    Objective: Vital Signs: There were no vitals taken for this visit.  Physical Exam Vitals and nursing note reviewed.  Constitutional:      Appearance: She is well-developed.  HENT:     Head: Atraumatic.     Nose: Nose normal.  Eyes:     Extraocular Movements: Extraocular movements intact.  Cardiovascular:     Pulses: Normal pulses.  Pulmonary:     Effort: Pulmonary effort is normal.  Abdominal:     Palpations: Abdomen is soft.  Musculoskeletal:     Cervical back: Neck supple.  Skin:    General: Skin is warm.     Capillary Refill: Capillary refill takes less than 2 seconds.  Neurological:     Mental Status: She is alert. Mental status is at baseline.  Psychiatric:        Behavior: Behavior normal.        Thought Content: Thought content normal.        Judgment: Judgment normal.     Ortho Exam Bilateral hips show pain with weightbearing and ambulation and rotation.  No significant leg length discrepancies.  No trochanteric tenderness. Specialty Comments:  No specialty comments available.  Imaging: XR HIPS BILAT W OR W/O PELVIS 3-4 VIEWS Result Date: 08/09/2024  X-rays of the pelvis consistent with bilateral hip avascular necrosis worse on the right with femoral head collapse.    PMFS History: Patient Active Problem List   Diagnosis Date Noted   Weakness 10/22/2019   Pressure injury of skin 06/13/2018   Abnormal LFTs (liver function tests)    Pancytopenia (HCC)    Other pancytopenia (HCC) 06/02/2018   Orthostatic hypotension 05/30/2018   Severe protein-calorie malnutrition 05/30/2018   Hypocalcemia 05/30/2018   Transaminitis 05/30/2018   Fever 05/30/2018   Thrombocytopenia 05/30/2018   Severe bipolar I disorder, most recent episode depressed (HCC) 06/01/2017   Bipolar I disorder, current or most recent episode manic, with psychotic features (HCC)    Affective psychosis, bipolar (HCC) 01/05/2017   Manic behavior (HCC)    Past  Medical History:  Diagnosis Date   Bipolar 1 disorder (HCC)    Medical history non-contributory    Mental disorder    No pertinent past medical history     Family History  Problem Relation Age of Onset   Depression Sister    Depression Brother    Depression Sister    CAD Mother    Hypertension Father    Diabetes Father    Bipolar disorder Cousin     Past Surgical History:  Procedure Laterality Date   NO PAST SURGERIES     Social History   Occupational History   Not on file  Tobacco Use   Smoking status: Never   Smokeless tobacco: Never  Vaping Use   Vaping status: Every Day  Substance and Sexual Activity   Alcohol use: No   Drug use: No   Sexual activity: Never        "

## 2024-08-10 LAB — PREALBUMIN: Prealbumin: 17 mg/dL (ref 17–34)
